# Patient Record
Sex: Female | Born: 1950 | Race: White | Hispanic: No | Marital: Single | Smoking: Former smoker
Health system: Southern US, Community
[De-identification: ages and names within clinical notes are randomized; demographics above are authoritative.]

## PROBLEM LIST (undated history)

## (undated) DIAGNOSIS — I495 Sick sinus syndrome: Secondary | ICD-10-CM

## (undated) DIAGNOSIS — Z950 Presence of cardiac pacemaker: Secondary | ICD-10-CM

## (undated) DIAGNOSIS — I10 Essential (primary) hypertension: Secondary | ICD-10-CM

## (undated) DIAGNOSIS — I2510 Atherosclerotic heart disease of native coronary artery without angina pectoris: Secondary | ICD-10-CM

## (undated) DIAGNOSIS — N183 Chronic kidney disease, stage 3 (moderate): Secondary | ICD-10-CM

## (undated) DIAGNOSIS — Z794 Long term (current) use of insulin: Secondary | ICD-10-CM

## (undated) DIAGNOSIS — E118 Type 2 diabetes mellitus with unspecified complications: Secondary | ICD-10-CM

## (undated) DIAGNOSIS — R296 Repeated falls: Secondary | ICD-10-CM

## (undated) HISTORY — DX: Sick sinus syndrome: I49.5

## (undated) HISTORY — PX: REPLACEMENT TOTAL KNEE BILATERAL: SUR1225

## (undated) HISTORY — DX: Repeated falls: R29.6

## (undated) HISTORY — PX: CHOLECYSTECTOMY: SHX55

---

## 2006-12-09 HISTORY — PX: CARDIAC PACEMAKER PLACEMENT: SHX583

## 2012-03-27 DIAGNOSIS — I213 ST elevation (STEMI) myocardial infarction of unspecified site: Secondary | ICD-10-CM | POA: Insufficient documentation

## 2016-11-08 DIAGNOSIS — I129 Hypertensive chronic kidney disease with stage 1 through stage 4 chronic kidney disease, or unspecified chronic kidney disease: Secondary | ICD-10-CM | POA: Diagnosis not present

## 2016-11-08 DIAGNOSIS — Z96653 Presence of artificial knee joint, bilateral: Secondary | ICD-10-CM | POA: Diagnosis not present

## 2016-11-08 DIAGNOSIS — N189 Chronic kidney disease, unspecified: Secondary | ICD-10-CM | POA: Diagnosis not present

## 2016-11-08 DIAGNOSIS — E1122 Type 2 diabetes mellitus with diabetic chronic kidney disease: Secondary | ICD-10-CM | POA: Diagnosis not present

## 2016-11-08 DIAGNOSIS — I252 Old myocardial infarction: Secondary | ICD-10-CM | POA: Diagnosis not present

## 2016-11-08 DIAGNOSIS — Z950 Presence of cardiac pacemaker: Secondary | ICD-10-CM | POA: Diagnosis not present

## 2016-11-08 DIAGNOSIS — Z794 Long term (current) use of insulin: Secondary | ICD-10-CM | POA: Diagnosis not present

## 2016-11-08 DIAGNOSIS — Z471 Aftercare following joint replacement surgery: Secondary | ICD-10-CM | POA: Diagnosis not present

## 2016-11-08 DIAGNOSIS — Z7901 Long term (current) use of anticoagulants: Secondary | ICD-10-CM | POA: Diagnosis not present

## 2016-11-08 DIAGNOSIS — M069 Rheumatoid arthritis, unspecified: Secondary | ICD-10-CM | POA: Diagnosis not present

## 2016-11-11 DIAGNOSIS — E1122 Type 2 diabetes mellitus with diabetic chronic kidney disease: Secondary | ICD-10-CM | POA: Diagnosis not present

## 2016-11-11 DIAGNOSIS — I129 Hypertensive chronic kidney disease with stage 1 through stage 4 chronic kidney disease, or unspecified chronic kidney disease: Secondary | ICD-10-CM | POA: Diagnosis not present

## 2016-11-11 DIAGNOSIS — Z471 Aftercare following joint replacement surgery: Secondary | ICD-10-CM | POA: Diagnosis not present

## 2016-11-11 DIAGNOSIS — M069 Rheumatoid arthritis, unspecified: Secondary | ICD-10-CM | POA: Diagnosis not present

## 2016-11-11 DIAGNOSIS — I252 Old myocardial infarction: Secondary | ICD-10-CM | POA: Diagnosis not present

## 2016-11-11 DIAGNOSIS — N189 Chronic kidney disease, unspecified: Secondary | ICD-10-CM | POA: Diagnosis not present

## 2016-11-12 DIAGNOSIS — I252 Old myocardial infarction: Secondary | ICD-10-CM | POA: Diagnosis not present

## 2016-11-12 DIAGNOSIS — I129 Hypertensive chronic kidney disease with stage 1 through stage 4 chronic kidney disease, or unspecified chronic kidney disease: Secondary | ICD-10-CM | POA: Diagnosis not present

## 2016-11-12 DIAGNOSIS — N189 Chronic kidney disease, unspecified: Secondary | ICD-10-CM | POA: Diagnosis not present

## 2016-11-12 DIAGNOSIS — M069 Rheumatoid arthritis, unspecified: Secondary | ICD-10-CM | POA: Diagnosis not present

## 2016-11-12 DIAGNOSIS — Z471 Aftercare following joint replacement surgery: Secondary | ICD-10-CM | POA: Diagnosis not present

## 2016-11-12 DIAGNOSIS — E1122 Type 2 diabetes mellitus with diabetic chronic kidney disease: Secondary | ICD-10-CM | POA: Diagnosis not present

## 2016-11-14 DIAGNOSIS — M069 Rheumatoid arthritis, unspecified: Secondary | ICD-10-CM | POA: Diagnosis not present

## 2016-11-14 DIAGNOSIS — I252 Old myocardial infarction: Secondary | ICD-10-CM | POA: Diagnosis not present

## 2016-11-14 DIAGNOSIS — E1122 Type 2 diabetes mellitus with diabetic chronic kidney disease: Secondary | ICD-10-CM | POA: Diagnosis not present

## 2016-11-14 DIAGNOSIS — I129 Hypertensive chronic kidney disease with stage 1 through stage 4 chronic kidney disease, or unspecified chronic kidney disease: Secondary | ICD-10-CM | POA: Diagnosis not present

## 2016-11-14 DIAGNOSIS — N189 Chronic kidney disease, unspecified: Secondary | ICD-10-CM | POA: Diagnosis not present

## 2016-11-14 DIAGNOSIS — Z471 Aftercare following joint replacement surgery: Secondary | ICD-10-CM | POA: Diagnosis not present

## 2016-11-19 DIAGNOSIS — M069 Rheumatoid arthritis, unspecified: Secondary | ICD-10-CM | POA: Diagnosis not present

## 2016-11-19 DIAGNOSIS — I252 Old myocardial infarction: Secondary | ICD-10-CM | POA: Diagnosis not present

## 2016-11-19 DIAGNOSIS — I129 Hypertensive chronic kidney disease with stage 1 through stage 4 chronic kidney disease, or unspecified chronic kidney disease: Secondary | ICD-10-CM | POA: Diagnosis not present

## 2016-11-19 DIAGNOSIS — E1122 Type 2 diabetes mellitus with diabetic chronic kidney disease: Secondary | ICD-10-CM | POA: Diagnosis not present

## 2016-11-19 DIAGNOSIS — Z471 Aftercare following joint replacement surgery: Secondary | ICD-10-CM | POA: Diagnosis not present

## 2016-11-19 DIAGNOSIS — N189 Chronic kidney disease, unspecified: Secondary | ICD-10-CM | POA: Diagnosis not present

## 2016-11-21 DIAGNOSIS — E1122 Type 2 diabetes mellitus with diabetic chronic kidney disease: Secondary | ICD-10-CM | POA: Diagnosis not present

## 2016-11-21 DIAGNOSIS — M069 Rheumatoid arthritis, unspecified: Secondary | ICD-10-CM | POA: Diagnosis not present

## 2016-11-21 DIAGNOSIS — I252 Old myocardial infarction: Secondary | ICD-10-CM | POA: Diagnosis not present

## 2016-11-21 DIAGNOSIS — N189 Chronic kidney disease, unspecified: Secondary | ICD-10-CM | POA: Diagnosis not present

## 2016-11-21 DIAGNOSIS — Z471 Aftercare following joint replacement surgery: Secondary | ICD-10-CM | POA: Diagnosis not present

## 2016-11-21 DIAGNOSIS — I129 Hypertensive chronic kidney disease with stage 1 through stage 4 chronic kidney disease, or unspecified chronic kidney disease: Secondary | ICD-10-CM | POA: Diagnosis not present

## 2016-11-26 DIAGNOSIS — M069 Rheumatoid arthritis, unspecified: Secondary | ICD-10-CM | POA: Diagnosis not present

## 2016-11-26 DIAGNOSIS — Z471 Aftercare following joint replacement surgery: Secondary | ICD-10-CM | POA: Diagnosis not present

## 2016-11-26 DIAGNOSIS — I129 Hypertensive chronic kidney disease with stage 1 through stage 4 chronic kidney disease, or unspecified chronic kidney disease: Secondary | ICD-10-CM | POA: Diagnosis not present

## 2016-11-26 DIAGNOSIS — I252 Old myocardial infarction: Secondary | ICD-10-CM | POA: Diagnosis not present

## 2016-11-26 DIAGNOSIS — E1122 Type 2 diabetes mellitus with diabetic chronic kidney disease: Secondary | ICD-10-CM | POA: Diagnosis not present

## 2016-11-26 DIAGNOSIS — N189 Chronic kidney disease, unspecified: Secondary | ICD-10-CM | POA: Diagnosis not present

## 2016-11-28 DIAGNOSIS — N189 Chronic kidney disease, unspecified: Secondary | ICD-10-CM | POA: Diagnosis not present

## 2016-11-28 DIAGNOSIS — I252 Old myocardial infarction: Secondary | ICD-10-CM | POA: Diagnosis not present

## 2016-11-28 DIAGNOSIS — M069 Rheumatoid arthritis, unspecified: Secondary | ICD-10-CM | POA: Diagnosis not present

## 2016-11-28 DIAGNOSIS — I129 Hypertensive chronic kidney disease with stage 1 through stage 4 chronic kidney disease, or unspecified chronic kidney disease: Secondary | ICD-10-CM | POA: Diagnosis not present

## 2016-11-28 DIAGNOSIS — E1122 Type 2 diabetes mellitus with diabetic chronic kidney disease: Secondary | ICD-10-CM | POA: Diagnosis not present

## 2016-11-28 DIAGNOSIS — Z471 Aftercare following joint replacement surgery: Secondary | ICD-10-CM | POA: Diagnosis not present

## 2016-12-05 DIAGNOSIS — I129 Hypertensive chronic kidney disease with stage 1 through stage 4 chronic kidney disease, or unspecified chronic kidney disease: Secondary | ICD-10-CM | POA: Diagnosis not present

## 2016-12-05 DIAGNOSIS — N189 Chronic kidney disease, unspecified: Secondary | ICD-10-CM | POA: Diagnosis not present

## 2016-12-05 DIAGNOSIS — M069 Rheumatoid arthritis, unspecified: Secondary | ICD-10-CM | POA: Diagnosis not present

## 2016-12-05 DIAGNOSIS — E1122 Type 2 diabetes mellitus with diabetic chronic kidney disease: Secondary | ICD-10-CM | POA: Diagnosis not present

## 2016-12-05 DIAGNOSIS — Z471 Aftercare following joint replacement surgery: Secondary | ICD-10-CM | POA: Diagnosis not present

## 2016-12-05 DIAGNOSIS — I252 Old myocardial infarction: Secondary | ICD-10-CM | POA: Diagnosis not present

## 2016-12-23 DIAGNOSIS — Z0001 Encounter for general adult medical examination with abnormal findings: Secondary | ICD-10-CM | POA: Diagnosis not present

## 2016-12-23 DIAGNOSIS — M545 Low back pain: Secondary | ICD-10-CM | POA: Diagnosis not present

## 2016-12-23 DIAGNOSIS — R7309 Other abnormal glucose: Secondary | ICD-10-CM | POA: Diagnosis not present

## 2016-12-23 DIAGNOSIS — E1122 Type 2 diabetes mellitus with diabetic chronic kidney disease: Secondary | ICD-10-CM | POA: Diagnosis not present

## 2016-12-23 DIAGNOSIS — N189 Chronic kidney disease, unspecified: Secondary | ICD-10-CM | POA: Diagnosis not present

## 2016-12-23 DIAGNOSIS — Z0000 Encounter for general adult medical examination without abnormal findings: Secondary | ICD-10-CM | POA: Diagnosis not present

## 2016-12-23 DIAGNOSIS — Z6835 Body mass index (BMI) 35.0-35.9, adult: Secondary | ICD-10-CM | POA: Diagnosis not present

## 2016-12-23 DIAGNOSIS — E668 Other obesity: Secondary | ICD-10-CM | POA: Diagnosis not present

## 2016-12-25 DIAGNOSIS — Z96651 Presence of right artificial knee joint: Secondary | ICD-10-CM | POA: Diagnosis not present

## 2016-12-25 DIAGNOSIS — M25561 Pain in right knee: Secondary | ICD-10-CM | POA: Diagnosis not present

## 2017-02-19 DIAGNOSIS — Z96651 Presence of right artificial knee joint: Secondary | ICD-10-CM | POA: Diagnosis not present

## 2017-02-19 DIAGNOSIS — M25561 Pain in right knee: Secondary | ICD-10-CM | POA: Diagnosis not present

## 2017-03-17 DIAGNOSIS — R001 Bradycardia, unspecified: Secondary | ICD-10-CM | POA: Diagnosis not present

## 2017-03-27 DIAGNOSIS — E1122 Type 2 diabetes mellitus with diabetic chronic kidney disease: Secondary | ICD-10-CM | POA: Diagnosis not present

## 2017-03-27 DIAGNOSIS — Z803 Family history of malignant neoplasm of breast: Secondary | ICD-10-CM | POA: Diagnosis not present

## 2017-03-27 DIAGNOSIS — Z1231 Encounter for screening mammogram for malignant neoplasm of breast: Secondary | ICD-10-CM | POA: Diagnosis not present

## 2017-05-02 DIAGNOSIS — E1122 Type 2 diabetes mellitus with diabetic chronic kidney disease: Secondary | ICD-10-CM | POA: Diagnosis not present

## 2017-05-22 DIAGNOSIS — E668 Other obesity: Secondary | ICD-10-CM | POA: Diagnosis not present

## 2017-05-22 DIAGNOSIS — E1122 Type 2 diabetes mellitus with diabetic chronic kidney disease: Secondary | ICD-10-CM | POA: Diagnosis not present

## 2017-05-22 DIAGNOSIS — I10 Essential (primary) hypertension: Secondary | ICD-10-CM | POA: Diagnosis not present

## 2017-05-22 DIAGNOSIS — Z0000 Encounter for general adult medical examination without abnormal findings: Secondary | ICD-10-CM | POA: Diagnosis not present

## 2017-08-13 DIAGNOSIS — E119 Type 2 diabetes mellitus without complications: Secondary | ICD-10-CM | POA: Diagnosis not present

## 2017-08-20 DIAGNOSIS — E668 Other obesity: Secondary | ICD-10-CM | POA: Diagnosis not present

## 2017-08-20 DIAGNOSIS — N189 Chronic kidney disease, unspecified: Secondary | ICD-10-CM | POA: Diagnosis not present

## 2017-08-20 DIAGNOSIS — M545 Low back pain: Secondary | ICD-10-CM | POA: Diagnosis not present

## 2017-08-20 DIAGNOSIS — I10 Essential (primary) hypertension: Secondary | ICD-10-CM | POA: Diagnosis not present

## 2017-09-19 DIAGNOSIS — D131 Benign neoplasm of stomach: Secondary | ICD-10-CM | POA: Diagnosis not present

## 2017-09-19 DIAGNOSIS — K3184 Gastroparesis: Secondary | ICD-10-CM | POA: Diagnosis present

## 2017-09-19 DIAGNOSIS — K2950 Unspecified chronic gastritis without bleeding: Secondary | ICD-10-CM | POA: Diagnosis not present

## 2017-09-19 DIAGNOSIS — I82612 Acute embolism and thrombosis of superficial veins of left upper extremity: Secondary | ICD-10-CM | POA: Diagnosis not present

## 2017-09-19 DIAGNOSIS — N390 Urinary tract infection, site not specified: Secondary | ICD-10-CM | POA: Diagnosis not present

## 2017-09-19 DIAGNOSIS — R198 Other specified symptoms and signs involving the digestive system and abdomen: Secondary | ICD-10-CM | POA: Diagnosis not present

## 2017-09-19 DIAGNOSIS — R1084 Generalized abdominal pain: Secondary | ICD-10-CM | POA: Diagnosis not present

## 2017-09-19 DIAGNOSIS — I10 Essential (primary) hypertension: Secondary | ICD-10-CM | POA: Diagnosis not present

## 2017-09-19 DIAGNOSIS — B9620 Unspecified Escherichia coli [E. coli] as the cause of diseases classified elsewhere: Secondary | ICD-10-CM | POA: Diagnosis not present

## 2017-09-19 DIAGNOSIS — Z8719 Personal history of other diseases of the digestive system: Secondary | ICD-10-CM | POA: Diagnosis not present

## 2017-09-19 DIAGNOSIS — Z794 Long term (current) use of insulin: Secondary | ICD-10-CM | POA: Diagnosis not present

## 2017-09-19 DIAGNOSIS — N182 Chronic kidney disease, stage 2 (mild): Secondary | ICD-10-CM | POA: Diagnosis not present

## 2017-09-19 DIAGNOSIS — Z6836 Body mass index (BMI) 36.0-36.9, adult: Secondary | ICD-10-CM | POA: Diagnosis not present

## 2017-09-19 DIAGNOSIS — Z66 Do not resuscitate: Secondary | ICD-10-CM | POA: Diagnosis present

## 2017-09-19 DIAGNOSIS — R195 Other fecal abnormalities: Secondary | ICD-10-CM | POA: Diagnosis not present

## 2017-09-19 DIAGNOSIS — F329 Major depressive disorder, single episode, unspecified: Secondary | ICD-10-CM | POA: Diagnosis present

## 2017-09-19 DIAGNOSIS — R109 Unspecified abdominal pain: Secondary | ICD-10-CM | POA: Diagnosis not present

## 2017-09-19 DIAGNOSIS — T82534A Leakage of infusion catheter, initial encounter: Secondary | ICD-10-CM | POA: Diagnosis not present

## 2017-09-19 DIAGNOSIS — K582 Mixed irritable bowel syndrome: Secondary | ICD-10-CM | POA: Diagnosis not present

## 2017-09-19 DIAGNOSIS — K3189 Other diseases of stomach and duodenum: Secondary | ICD-10-CM | POA: Diagnosis not present

## 2017-09-19 DIAGNOSIS — Z23 Encounter for immunization: Secondary | ICD-10-CM | POA: Diagnosis not present

## 2017-09-19 DIAGNOSIS — F419 Anxiety disorder, unspecified: Secondary | ICD-10-CM | POA: Diagnosis present

## 2017-09-19 DIAGNOSIS — E119 Type 2 diabetes mellitus without complications: Secondary | ICD-10-CM | POA: Diagnosis not present

## 2017-09-19 DIAGNOSIS — K5732 Diverticulitis of large intestine without perforation or abscess without bleeding: Secondary | ICD-10-CM | POA: Diagnosis not present

## 2017-09-19 DIAGNOSIS — E1122 Type 2 diabetes mellitus with diabetic chronic kidney disease: Secondary | ICD-10-CM | POA: Diagnosis present

## 2017-09-19 DIAGNOSIS — R1013 Epigastric pain: Secondary | ICD-10-CM | POA: Diagnosis not present

## 2017-09-19 DIAGNOSIS — E876 Hypokalemia: Secondary | ICD-10-CM | POA: Diagnosis not present

## 2017-09-19 DIAGNOSIS — E669 Obesity, unspecified: Secondary | ICD-10-CM | POA: Diagnosis present

## 2017-09-19 DIAGNOSIS — E213 Hyperparathyroidism, unspecified: Secondary | ICD-10-CM | POA: Diagnosis present

## 2017-09-19 DIAGNOSIS — G8929 Other chronic pain: Secondary | ICD-10-CM | POA: Diagnosis present

## 2017-09-19 DIAGNOSIS — I129 Hypertensive chronic kidney disease with stage 1 through stage 4 chronic kidney disease, or unspecified chronic kidney disease: Secondary | ICD-10-CM | POA: Diagnosis present

## 2017-09-19 DIAGNOSIS — R197 Diarrhea, unspecified: Secondary | ICD-10-CM | POA: Diagnosis not present

## 2017-09-19 DIAGNOSIS — E1143 Type 2 diabetes mellitus with diabetic autonomic (poly)neuropathy: Secondary | ICD-10-CM | POA: Diagnosis present

## 2017-09-19 DIAGNOSIS — K529 Noninfective gastroenteritis and colitis, unspecified: Secondary | ICD-10-CM | POA: Diagnosis not present

## 2017-09-29 DIAGNOSIS — I129 Hypertensive chronic kidney disease with stage 1 through stage 4 chronic kidney disease, or unspecified chronic kidney disease: Secondary | ICD-10-CM | POA: Diagnosis not present

## 2017-09-29 DIAGNOSIS — I2510 Atherosclerotic heart disease of native coronary artery without angina pectoris: Secondary | ICD-10-CM | POA: Diagnosis not present

## 2017-09-29 DIAGNOSIS — M6281 Muscle weakness (generalized): Secondary | ICD-10-CM | POA: Diagnosis not present

## 2017-09-29 DIAGNOSIS — E1122 Type 2 diabetes mellitus with diabetic chronic kidney disease: Secondary | ICD-10-CM | POA: Diagnosis not present

## 2017-09-29 DIAGNOSIS — N182 Chronic kidney disease, stage 2 (mild): Secondary | ICD-10-CM | POA: Diagnosis not present

## 2017-09-29 DIAGNOSIS — M1990 Unspecified osteoarthritis, unspecified site: Secondary | ICD-10-CM | POA: Diagnosis not present

## 2017-09-29 DIAGNOSIS — F419 Anxiety disorder, unspecified: Secondary | ICD-10-CM | POA: Diagnosis not present

## 2017-09-29 DIAGNOSIS — F329 Major depressive disorder, single episode, unspecified: Secondary | ICD-10-CM | POA: Diagnosis not present

## 2017-09-29 DIAGNOSIS — Z794 Long term (current) use of insulin: Secondary | ICD-10-CM | POA: Diagnosis not present

## 2017-09-29 DIAGNOSIS — E039 Hypothyroidism, unspecified: Secondary | ICD-10-CM | POA: Diagnosis not present

## 2017-09-30 DIAGNOSIS — I129 Hypertensive chronic kidney disease with stage 1 through stage 4 chronic kidney disease, or unspecified chronic kidney disease: Secondary | ICD-10-CM | POA: Diagnosis not present

## 2017-09-30 DIAGNOSIS — N182 Chronic kidney disease, stage 2 (mild): Secondary | ICD-10-CM | POA: Diagnosis not present

## 2017-09-30 DIAGNOSIS — I2510 Atherosclerotic heart disease of native coronary artery without angina pectoris: Secondary | ICD-10-CM | POA: Diagnosis not present

## 2017-09-30 DIAGNOSIS — M1990 Unspecified osteoarthritis, unspecified site: Secondary | ICD-10-CM | POA: Diagnosis not present

## 2017-09-30 DIAGNOSIS — F329 Major depressive disorder, single episode, unspecified: Secondary | ICD-10-CM | POA: Diagnosis not present

## 2017-09-30 DIAGNOSIS — E1122 Type 2 diabetes mellitus with diabetic chronic kidney disease: Secondary | ICD-10-CM | POA: Diagnosis not present

## 2017-10-01 DIAGNOSIS — E1122 Type 2 diabetes mellitus with diabetic chronic kidney disease: Secondary | ICD-10-CM | POA: Diagnosis not present

## 2017-10-01 DIAGNOSIS — I129 Hypertensive chronic kidney disease with stage 1 through stage 4 chronic kidney disease, or unspecified chronic kidney disease: Secondary | ICD-10-CM | POA: Diagnosis not present

## 2017-10-01 DIAGNOSIS — F329 Major depressive disorder, single episode, unspecified: Secondary | ICD-10-CM | POA: Diagnosis not present

## 2017-10-01 DIAGNOSIS — M1990 Unspecified osteoarthritis, unspecified site: Secondary | ICD-10-CM | POA: Diagnosis not present

## 2017-10-01 DIAGNOSIS — N182 Chronic kidney disease, stage 2 (mild): Secondary | ICD-10-CM | POA: Diagnosis not present

## 2017-10-01 DIAGNOSIS — I2510 Atherosclerotic heart disease of native coronary artery without angina pectoris: Secondary | ICD-10-CM | POA: Diagnosis not present

## 2017-10-02 DIAGNOSIS — I2510 Atherosclerotic heart disease of native coronary artery without angina pectoris: Secondary | ICD-10-CM | POA: Diagnosis not present

## 2017-10-02 DIAGNOSIS — M1990 Unspecified osteoarthritis, unspecified site: Secondary | ICD-10-CM | POA: Diagnosis not present

## 2017-10-02 DIAGNOSIS — N182 Chronic kidney disease, stage 2 (mild): Secondary | ICD-10-CM | POA: Diagnosis not present

## 2017-10-02 DIAGNOSIS — E1122 Type 2 diabetes mellitus with diabetic chronic kidney disease: Secondary | ICD-10-CM | POA: Diagnosis not present

## 2017-10-02 DIAGNOSIS — F329 Major depressive disorder, single episode, unspecified: Secondary | ICD-10-CM | POA: Diagnosis not present

## 2017-10-02 DIAGNOSIS — I129 Hypertensive chronic kidney disease with stage 1 through stage 4 chronic kidney disease, or unspecified chronic kidney disease: Secondary | ICD-10-CM | POA: Diagnosis not present

## 2017-10-06 DIAGNOSIS — M1990 Unspecified osteoarthritis, unspecified site: Secondary | ICD-10-CM | POA: Diagnosis not present

## 2017-10-06 DIAGNOSIS — N182 Chronic kidney disease, stage 2 (mild): Secondary | ICD-10-CM | POA: Diagnosis not present

## 2017-10-06 DIAGNOSIS — E1122 Type 2 diabetes mellitus with diabetic chronic kidney disease: Secondary | ICD-10-CM | POA: Diagnosis not present

## 2017-10-06 DIAGNOSIS — F329 Major depressive disorder, single episode, unspecified: Secondary | ICD-10-CM | POA: Diagnosis not present

## 2017-10-06 DIAGNOSIS — I2510 Atherosclerotic heart disease of native coronary artery without angina pectoris: Secondary | ICD-10-CM | POA: Diagnosis not present

## 2017-10-06 DIAGNOSIS — I129 Hypertensive chronic kidney disease with stage 1 through stage 4 chronic kidney disease, or unspecified chronic kidney disease: Secondary | ICD-10-CM | POA: Diagnosis not present

## 2017-10-10 DIAGNOSIS — N182 Chronic kidney disease, stage 2 (mild): Secondary | ICD-10-CM | POA: Diagnosis not present

## 2017-10-10 DIAGNOSIS — I129 Hypertensive chronic kidney disease with stage 1 through stage 4 chronic kidney disease, or unspecified chronic kidney disease: Secondary | ICD-10-CM | POA: Diagnosis not present

## 2017-10-10 DIAGNOSIS — F329 Major depressive disorder, single episode, unspecified: Secondary | ICD-10-CM | POA: Diagnosis not present

## 2017-10-10 DIAGNOSIS — I2510 Atherosclerotic heart disease of native coronary artery without angina pectoris: Secondary | ICD-10-CM | POA: Diagnosis not present

## 2017-10-10 DIAGNOSIS — E1122 Type 2 diabetes mellitus with diabetic chronic kidney disease: Secondary | ICD-10-CM | POA: Diagnosis not present

## 2017-10-10 DIAGNOSIS — M1990 Unspecified osteoarthritis, unspecified site: Secondary | ICD-10-CM | POA: Diagnosis not present

## 2017-10-14 DIAGNOSIS — I129 Hypertensive chronic kidney disease with stage 1 through stage 4 chronic kidney disease, or unspecified chronic kidney disease: Secondary | ICD-10-CM | POA: Diagnosis not present

## 2017-10-14 DIAGNOSIS — F329 Major depressive disorder, single episode, unspecified: Secondary | ICD-10-CM | POA: Diagnosis not present

## 2017-10-14 DIAGNOSIS — M1990 Unspecified osteoarthritis, unspecified site: Secondary | ICD-10-CM | POA: Diagnosis not present

## 2017-10-14 DIAGNOSIS — N182 Chronic kidney disease, stage 2 (mild): Secondary | ICD-10-CM | POA: Diagnosis not present

## 2017-10-14 DIAGNOSIS — E1122 Type 2 diabetes mellitus with diabetic chronic kidney disease: Secondary | ICD-10-CM | POA: Diagnosis not present

## 2017-10-14 DIAGNOSIS — I2510 Atherosclerotic heart disease of native coronary artery without angina pectoris: Secondary | ICD-10-CM | POA: Diagnosis not present

## 2017-10-15 DIAGNOSIS — E1122 Type 2 diabetes mellitus with diabetic chronic kidney disease: Secondary | ICD-10-CM | POA: Diagnosis not present

## 2017-10-15 DIAGNOSIS — N182 Chronic kidney disease, stage 2 (mild): Secondary | ICD-10-CM | POA: Diagnosis not present

## 2017-10-15 DIAGNOSIS — F329 Major depressive disorder, single episode, unspecified: Secondary | ICD-10-CM | POA: Diagnosis not present

## 2017-10-15 DIAGNOSIS — M1990 Unspecified osteoarthritis, unspecified site: Secondary | ICD-10-CM | POA: Diagnosis not present

## 2017-10-15 DIAGNOSIS — I129 Hypertensive chronic kidney disease with stage 1 through stage 4 chronic kidney disease, or unspecified chronic kidney disease: Secondary | ICD-10-CM | POA: Diagnosis not present

## 2017-10-15 DIAGNOSIS — I2510 Atherosclerotic heart disease of native coronary artery without angina pectoris: Secondary | ICD-10-CM | POA: Diagnosis not present

## 2017-10-21 DIAGNOSIS — Z950 Presence of cardiac pacemaker: Secondary | ICD-10-CM | POA: Diagnosis not present

## 2017-10-21 DIAGNOSIS — N179 Acute kidney failure, unspecified: Secondary | ICD-10-CM | POA: Diagnosis not present

## 2017-10-21 DIAGNOSIS — I21A1 Myocardial infarction type 2: Secondary | ICD-10-CM | POA: Diagnosis not present

## 2017-10-21 DIAGNOSIS — E1122 Type 2 diabetes mellitus with diabetic chronic kidney disease: Secondary | ICD-10-CM | POA: Diagnosis present

## 2017-10-21 DIAGNOSIS — I129 Hypertensive chronic kidney disease with stage 1 through stage 4 chronic kidney disease, or unspecified chronic kidney disease: Secondary | ICD-10-CM | POA: Diagnosis present

## 2017-10-21 DIAGNOSIS — R7989 Other specified abnormal findings of blood chemistry: Secondary | ICD-10-CM | POA: Diagnosis not present

## 2017-10-21 DIAGNOSIS — R079 Chest pain, unspecified: Secondary | ICD-10-CM | POA: Diagnosis not present

## 2017-10-21 DIAGNOSIS — R109 Unspecified abdominal pain: Secondary | ICD-10-CM | POA: Diagnosis not present

## 2017-10-21 DIAGNOSIS — I495 Sick sinus syndrome: Secondary | ICD-10-CM | POA: Diagnosis present

## 2017-10-21 DIAGNOSIS — I119 Hypertensive heart disease without heart failure: Secondary | ICD-10-CM | POA: Diagnosis not present

## 2017-10-21 DIAGNOSIS — E860 Dehydration: Secondary | ICD-10-CM | POA: Diagnosis not present

## 2017-10-21 DIAGNOSIS — R42 Dizziness and giddiness: Secondary | ICD-10-CM | POA: Diagnosis not present

## 2017-10-21 DIAGNOSIS — E871 Hypo-osmolality and hyponatremia: Secondary | ICD-10-CM | POA: Diagnosis not present

## 2017-10-21 DIAGNOSIS — K3184 Gastroparesis: Secondary | ICD-10-CM | POA: Diagnosis present

## 2017-10-21 DIAGNOSIS — E1143 Type 2 diabetes mellitus with diabetic autonomic (poly)neuropathy: Secondary | ICD-10-CM | POA: Diagnosis not present

## 2017-10-21 DIAGNOSIS — N183 Chronic kidney disease, stage 3 (moderate): Secondary | ICD-10-CM | POA: Diagnosis not present

## 2017-10-21 DIAGNOSIS — K828 Other specified diseases of gallbladder: Secondary | ICD-10-CM | POA: Diagnosis present

## 2017-10-21 DIAGNOSIS — N390 Urinary tract infection, site not specified: Secondary | ICD-10-CM | POA: Diagnosis not present

## 2017-10-21 DIAGNOSIS — R112 Nausea with vomiting, unspecified: Secondary | ICD-10-CM | POA: Diagnosis not present

## 2017-10-21 DIAGNOSIS — Z9181 History of falling: Secondary | ICD-10-CM | POA: Diagnosis not present

## 2017-10-21 DIAGNOSIS — E1165 Type 2 diabetes mellitus with hyperglycemia: Secondary | ICD-10-CM | POA: Diagnosis not present

## 2017-10-26 DIAGNOSIS — I129 Hypertensive chronic kidney disease with stage 1 through stage 4 chronic kidney disease, or unspecified chronic kidney disease: Secondary | ICD-10-CM | POA: Diagnosis not present

## 2017-10-26 DIAGNOSIS — E1122 Type 2 diabetes mellitus with diabetic chronic kidney disease: Secondary | ICD-10-CM | POA: Diagnosis not present

## 2017-10-26 DIAGNOSIS — I2510 Atherosclerotic heart disease of native coronary artery without angina pectoris: Secondary | ICD-10-CM | POA: Diagnosis not present

## 2017-10-26 DIAGNOSIS — F329 Major depressive disorder, single episode, unspecified: Secondary | ICD-10-CM | POA: Diagnosis not present

## 2017-10-26 DIAGNOSIS — M1990 Unspecified osteoarthritis, unspecified site: Secondary | ICD-10-CM | POA: Diagnosis not present

## 2017-10-26 DIAGNOSIS — N182 Chronic kidney disease, stage 2 (mild): Secondary | ICD-10-CM | POA: Diagnosis not present

## 2017-10-28 DIAGNOSIS — F329 Major depressive disorder, single episode, unspecified: Secondary | ICD-10-CM | POA: Diagnosis not present

## 2017-10-28 DIAGNOSIS — I2510 Atherosclerotic heart disease of native coronary artery without angina pectoris: Secondary | ICD-10-CM | POA: Diagnosis not present

## 2017-10-28 DIAGNOSIS — M1990 Unspecified osteoarthritis, unspecified site: Secondary | ICD-10-CM | POA: Diagnosis not present

## 2017-10-28 DIAGNOSIS — E1122 Type 2 diabetes mellitus with diabetic chronic kidney disease: Secondary | ICD-10-CM | POA: Diagnosis not present

## 2017-10-28 DIAGNOSIS — N182 Chronic kidney disease, stage 2 (mild): Secondary | ICD-10-CM | POA: Diagnosis not present

## 2017-10-28 DIAGNOSIS — I129 Hypertensive chronic kidney disease with stage 1 through stage 4 chronic kidney disease, or unspecified chronic kidney disease: Secondary | ICD-10-CM | POA: Diagnosis not present

## 2017-10-29 DIAGNOSIS — I129 Hypertensive chronic kidney disease with stage 1 through stage 4 chronic kidney disease, or unspecified chronic kidney disease: Secondary | ICD-10-CM | POA: Diagnosis not present

## 2017-10-29 DIAGNOSIS — M1990 Unspecified osteoarthritis, unspecified site: Secondary | ICD-10-CM | POA: Diagnosis not present

## 2017-10-29 DIAGNOSIS — I2510 Atherosclerotic heart disease of native coronary artery without angina pectoris: Secondary | ICD-10-CM | POA: Diagnosis not present

## 2017-10-29 DIAGNOSIS — E1122 Type 2 diabetes mellitus with diabetic chronic kidney disease: Secondary | ICD-10-CM | POA: Diagnosis not present

## 2017-10-29 DIAGNOSIS — F329 Major depressive disorder, single episode, unspecified: Secondary | ICD-10-CM | POA: Diagnosis not present

## 2017-10-29 DIAGNOSIS — N182 Chronic kidney disease, stage 2 (mild): Secondary | ICD-10-CM | POA: Diagnosis not present

## 2017-11-03 DIAGNOSIS — E1122 Type 2 diabetes mellitus with diabetic chronic kidney disease: Secondary | ICD-10-CM | POA: Diagnosis not present

## 2017-11-03 DIAGNOSIS — M1990 Unspecified osteoarthritis, unspecified site: Secondary | ICD-10-CM | POA: Diagnosis not present

## 2017-11-03 DIAGNOSIS — I2510 Atherosclerotic heart disease of native coronary artery without angina pectoris: Secondary | ICD-10-CM | POA: Diagnosis not present

## 2017-11-03 DIAGNOSIS — I129 Hypertensive chronic kidney disease with stage 1 through stage 4 chronic kidney disease, or unspecified chronic kidney disease: Secondary | ICD-10-CM | POA: Diagnosis not present

## 2017-11-03 DIAGNOSIS — F329 Major depressive disorder, single episode, unspecified: Secondary | ICD-10-CM | POA: Diagnosis not present

## 2017-11-03 DIAGNOSIS — N182 Chronic kidney disease, stage 2 (mild): Secondary | ICD-10-CM | POA: Diagnosis not present

## 2017-11-05 DIAGNOSIS — N182 Chronic kidney disease, stage 2 (mild): Secondary | ICD-10-CM | POA: Diagnosis not present

## 2017-11-05 DIAGNOSIS — I129 Hypertensive chronic kidney disease with stage 1 through stage 4 chronic kidney disease, or unspecified chronic kidney disease: Secondary | ICD-10-CM | POA: Diagnosis not present

## 2017-11-05 DIAGNOSIS — E1122 Type 2 diabetes mellitus with diabetic chronic kidney disease: Secondary | ICD-10-CM | POA: Diagnosis not present

## 2017-11-05 DIAGNOSIS — F329 Major depressive disorder, single episode, unspecified: Secondary | ICD-10-CM | POA: Diagnosis not present

## 2017-11-05 DIAGNOSIS — I2510 Atherosclerotic heart disease of native coronary artery without angina pectoris: Secondary | ICD-10-CM | POA: Diagnosis not present

## 2017-11-05 DIAGNOSIS — M1990 Unspecified osteoarthritis, unspecified site: Secondary | ICD-10-CM | POA: Diagnosis not present

## 2017-11-06 DIAGNOSIS — N182 Chronic kidney disease, stage 2 (mild): Secondary | ICD-10-CM | POA: Diagnosis not present

## 2017-11-06 DIAGNOSIS — I2510 Atherosclerotic heart disease of native coronary artery without angina pectoris: Secondary | ICD-10-CM | POA: Diagnosis not present

## 2017-11-06 DIAGNOSIS — F329 Major depressive disorder, single episode, unspecified: Secondary | ICD-10-CM | POA: Diagnosis not present

## 2017-11-06 DIAGNOSIS — I129 Hypertensive chronic kidney disease with stage 1 through stage 4 chronic kidney disease, or unspecified chronic kidney disease: Secondary | ICD-10-CM | POA: Diagnosis not present

## 2017-11-06 DIAGNOSIS — M1990 Unspecified osteoarthritis, unspecified site: Secondary | ICD-10-CM | POA: Diagnosis not present

## 2017-11-06 DIAGNOSIS — E1122 Type 2 diabetes mellitus with diabetic chronic kidney disease: Secondary | ICD-10-CM | POA: Diagnosis not present

## 2017-11-11 DIAGNOSIS — F329 Major depressive disorder, single episode, unspecified: Secondary | ICD-10-CM | POA: Diagnosis not present

## 2017-11-11 DIAGNOSIS — I2510 Atherosclerotic heart disease of native coronary artery without angina pectoris: Secondary | ICD-10-CM | POA: Diagnosis not present

## 2017-11-11 DIAGNOSIS — I129 Hypertensive chronic kidney disease with stage 1 through stage 4 chronic kidney disease, or unspecified chronic kidney disease: Secondary | ICD-10-CM | POA: Diagnosis not present

## 2017-11-11 DIAGNOSIS — N182 Chronic kidney disease, stage 2 (mild): Secondary | ICD-10-CM | POA: Diagnosis not present

## 2017-11-11 DIAGNOSIS — M1990 Unspecified osteoarthritis, unspecified site: Secondary | ICD-10-CM | POA: Diagnosis not present

## 2017-11-11 DIAGNOSIS — E1122 Type 2 diabetes mellitus with diabetic chronic kidney disease: Secondary | ICD-10-CM | POA: Diagnosis not present

## 2017-11-13 DIAGNOSIS — I2510 Atherosclerotic heart disease of native coronary artery without angina pectoris: Secondary | ICD-10-CM | POA: Diagnosis not present

## 2017-11-13 DIAGNOSIS — E1122 Type 2 diabetes mellitus with diabetic chronic kidney disease: Secondary | ICD-10-CM | POA: Diagnosis not present

## 2017-11-13 DIAGNOSIS — M1990 Unspecified osteoarthritis, unspecified site: Secondary | ICD-10-CM | POA: Diagnosis not present

## 2017-11-13 DIAGNOSIS — N182 Chronic kidney disease, stage 2 (mild): Secondary | ICD-10-CM | POA: Diagnosis not present

## 2017-11-13 DIAGNOSIS — I129 Hypertensive chronic kidney disease with stage 1 through stage 4 chronic kidney disease, or unspecified chronic kidney disease: Secondary | ICD-10-CM | POA: Diagnosis not present

## 2017-11-13 DIAGNOSIS — F329 Major depressive disorder, single episode, unspecified: Secondary | ICD-10-CM | POA: Diagnosis not present

## 2017-11-14 DIAGNOSIS — I10 Essential (primary) hypertension: Secondary | ICD-10-CM | POA: Diagnosis not present

## 2017-11-14 DIAGNOSIS — E1122 Type 2 diabetes mellitus with diabetic chronic kidney disease: Secondary | ICD-10-CM | POA: Diagnosis not present

## 2017-11-14 DIAGNOSIS — E119 Type 2 diabetes mellitus without complications: Secondary | ICD-10-CM | POA: Diagnosis not present

## 2017-11-14 DIAGNOSIS — E669 Obesity, unspecified: Secondary | ICD-10-CM | POA: Diagnosis not present

## 2017-11-19 DIAGNOSIS — F329 Major depressive disorder, single episode, unspecified: Secondary | ICD-10-CM | POA: Diagnosis not present

## 2017-11-19 DIAGNOSIS — I129 Hypertensive chronic kidney disease with stage 1 through stage 4 chronic kidney disease, or unspecified chronic kidney disease: Secondary | ICD-10-CM | POA: Diagnosis not present

## 2017-11-19 DIAGNOSIS — E1122 Type 2 diabetes mellitus with diabetic chronic kidney disease: Secondary | ICD-10-CM | POA: Diagnosis not present

## 2017-11-19 DIAGNOSIS — I2510 Atherosclerotic heart disease of native coronary artery without angina pectoris: Secondary | ICD-10-CM | POA: Diagnosis not present

## 2017-11-19 DIAGNOSIS — M1990 Unspecified osteoarthritis, unspecified site: Secondary | ICD-10-CM | POA: Diagnosis not present

## 2017-11-19 DIAGNOSIS — N182 Chronic kidney disease, stage 2 (mild): Secondary | ICD-10-CM | POA: Diagnosis not present

## 2018-01-07 DIAGNOSIS — R5383 Other fatigue: Secondary | ICD-10-CM | POA: Diagnosis not present

## 2018-01-07 DIAGNOSIS — E119 Type 2 diabetes mellitus without complications: Secondary | ICD-10-CM | POA: Diagnosis not present

## 2018-01-16 DIAGNOSIS — Z9114 Patient's other noncompliance with medication regimen: Secondary | ICD-10-CM | POA: Diagnosis not present

## 2018-01-16 DIAGNOSIS — E1122 Type 2 diabetes mellitus with diabetic chronic kidney disease: Secondary | ICD-10-CM | POA: Diagnosis not present

## 2018-01-16 DIAGNOSIS — I10 Essential (primary) hypertension: Secondary | ICD-10-CM | POA: Diagnosis not present

## 2018-01-16 DIAGNOSIS — E213 Hyperparathyroidism, unspecified: Secondary | ICD-10-CM | POA: Diagnosis not present

## 2018-01-16 DIAGNOSIS — Z01818 Encounter for other preprocedural examination: Secondary | ICD-10-CM | POA: Diagnosis not present

## 2018-02-11 DIAGNOSIS — N3281 Overactive bladder: Secondary | ICD-10-CM | POA: Diagnosis not present

## 2018-02-11 DIAGNOSIS — N3941 Urge incontinence: Secondary | ICD-10-CM | POA: Diagnosis not present

## 2018-02-11 DIAGNOSIS — N2889 Other specified disorders of kidney and ureter: Secondary | ICD-10-CM | POA: Diagnosis not present

## 2018-03-06 DIAGNOSIS — N2889 Other specified disorders of kidney and ureter: Secondary | ICD-10-CM | POA: Diagnosis not present

## 2018-03-06 DIAGNOSIS — N281 Cyst of kidney, acquired: Secondary | ICD-10-CM | POA: Diagnosis not present

## 2018-03-11 DIAGNOSIS — N281 Cyst of kidney, acquired: Secondary | ICD-10-CM | POA: Diagnosis not present

## 2018-03-11 DIAGNOSIS — N3281 Overactive bladder: Secondary | ICD-10-CM | POA: Diagnosis not present

## 2018-03-11 DIAGNOSIS — N3941 Urge incontinence: Secondary | ICD-10-CM | POA: Diagnosis not present

## 2018-04-06 DIAGNOSIS — I422 Other hypertrophic cardiomyopathy: Secondary | ICD-10-CM | POA: Diagnosis not present

## 2018-04-06 DIAGNOSIS — R001 Bradycardia, unspecified: Secondary | ICD-10-CM | POA: Diagnosis not present

## 2018-04-06 DIAGNOSIS — I119 Hypertensive heart disease without heart failure: Secondary | ICD-10-CM | POA: Diagnosis not present

## 2018-04-06 DIAGNOSIS — I25118 Atherosclerotic heart disease of native coronary artery with other forms of angina pectoris: Secondary | ICD-10-CM | POA: Diagnosis not present

## 2018-04-06 DIAGNOSIS — R55 Syncope and collapse: Secondary | ICD-10-CM | POA: Diagnosis not present

## 2018-04-24 DIAGNOSIS — E119 Type 2 diabetes mellitus without complications: Secondary | ICD-10-CM | POA: Diagnosis not present

## 2018-04-24 DIAGNOSIS — Z043 Encounter for examination and observation following other accident: Secondary | ICD-10-CM | POA: Diagnosis not present

## 2018-04-24 DIAGNOSIS — M79672 Pain in left foot: Secondary | ICD-10-CM | POA: Diagnosis not present

## 2018-04-24 DIAGNOSIS — R1110 Vomiting, unspecified: Secondary | ICD-10-CM | POA: Diagnosis not present

## 2018-04-24 DIAGNOSIS — R109 Unspecified abdominal pain: Secondary | ICD-10-CM | POA: Diagnosis not present

## 2018-04-24 DIAGNOSIS — W19XXXA Unspecified fall, initial encounter: Secondary | ICD-10-CM | POA: Diagnosis not present

## 2018-04-24 DIAGNOSIS — M25511 Pain in right shoulder: Secondary | ICD-10-CM | POA: Diagnosis not present

## 2018-04-24 DIAGNOSIS — R531 Weakness: Secondary | ICD-10-CM | POA: Diagnosis not present

## 2018-04-30 DIAGNOSIS — E668 Other obesity: Secondary | ICD-10-CM | POA: Diagnosis not present

## 2018-04-30 DIAGNOSIS — K219 Gastro-esophageal reflux disease without esophagitis: Secondary | ICD-10-CM | POA: Diagnosis not present

## 2018-04-30 DIAGNOSIS — I10 Essential (primary) hypertension: Secondary | ICD-10-CM | POA: Diagnosis not present

## 2018-04-30 DIAGNOSIS — E1122 Type 2 diabetes mellitus with diabetic chronic kidney disease: Secondary | ICD-10-CM | POA: Diagnosis not present

## 2018-07-13 DIAGNOSIS — R001 Bradycardia, unspecified: Secondary | ICD-10-CM | POA: Diagnosis not present

## 2018-07-24 DIAGNOSIS — E1122 Type 2 diabetes mellitus with diabetic chronic kidney disease: Secondary | ICD-10-CM | POA: Diagnosis not present

## 2018-07-29 DIAGNOSIS — L608 Other nail disorders: Secondary | ICD-10-CM | POA: Diagnosis not present

## 2018-08-05 DIAGNOSIS — Z6834 Body mass index (BMI) 34.0-34.9, adult: Secondary | ICD-10-CM | POA: Diagnosis not present

## 2018-08-05 DIAGNOSIS — E1122 Type 2 diabetes mellitus with diabetic chronic kidney disease: Secondary | ICD-10-CM | POA: Diagnosis not present

## 2018-08-05 DIAGNOSIS — I10 Essential (primary) hypertension: Secondary | ICD-10-CM | POA: Diagnosis not present

## 2018-08-05 DIAGNOSIS — K219 Gastro-esophageal reflux disease without esophagitis: Secondary | ICD-10-CM | POA: Diagnosis not present

## 2018-09-03 DIAGNOSIS — R001 Bradycardia, unspecified: Secondary | ICD-10-CM | POA: Diagnosis not present

## 2018-09-08 DIAGNOSIS — I422 Other hypertrophic cardiomyopathy: Secondary | ICD-10-CM | POA: Diagnosis not present

## 2018-09-08 HISTORY — PX: PACEMAKER GENERATOR CHANGE: SHX5998

## 2018-09-15 DIAGNOSIS — I10 Essential (primary) hypertension: Secondary | ICD-10-CM | POA: Diagnosis not present

## 2018-09-15 DIAGNOSIS — N189 Chronic kidney disease, unspecified: Secondary | ICD-10-CM | POA: Diagnosis not present

## 2018-09-15 DIAGNOSIS — E669 Obesity, unspecified: Secondary | ICD-10-CM | POA: Diagnosis not present

## 2018-09-15 DIAGNOSIS — R42 Dizziness and giddiness: Secondary | ICD-10-CM | POA: Diagnosis not present

## 2018-09-15 DIAGNOSIS — Z794 Long term (current) use of insulin: Secondary | ICD-10-CM | POA: Diagnosis not present

## 2018-09-15 DIAGNOSIS — I2510 Atherosclerotic heart disease of native coronary artery without angina pectoris: Secondary | ICD-10-CM | POA: Diagnosis not present

## 2018-09-15 DIAGNOSIS — T82191A Other mechanical complication of cardiac pulse generator (battery), initial encounter: Secondary | ICD-10-CM | POA: Diagnosis not present

## 2018-09-15 DIAGNOSIS — Z9119 Patient's noncompliance with other medical treatment and regimen: Secondary | ICD-10-CM | POA: Diagnosis not present

## 2018-09-15 DIAGNOSIS — I129 Hypertensive chronic kidney disease with stage 1 through stage 4 chronic kidney disease, or unspecified chronic kidney disease: Secondary | ICD-10-CM | POA: Diagnosis not present

## 2018-09-15 DIAGNOSIS — I495 Sick sinus syndrome: Secondary | ICD-10-CM | POA: Diagnosis not present

## 2018-09-15 DIAGNOSIS — Z7984 Long term (current) use of oral hypoglycemic drugs: Secondary | ICD-10-CM | POA: Diagnosis not present

## 2018-09-15 DIAGNOSIS — R0600 Dyspnea, unspecified: Secondary | ICD-10-CM | POA: Diagnosis not present

## 2018-09-15 DIAGNOSIS — Z6834 Body mass index (BMI) 34.0-34.9, adult: Secondary | ICD-10-CM | POA: Diagnosis not present

## 2018-09-15 DIAGNOSIS — Z45010 Encounter for checking and testing of cardiac pacemaker pulse generator [battery]: Secondary | ICD-10-CM | POA: Diagnosis not present

## 2018-09-15 DIAGNOSIS — E1122 Type 2 diabetes mellitus with diabetic chronic kidney disease: Secondary | ICD-10-CM | POA: Diagnosis not present

## 2018-09-15 DIAGNOSIS — I422 Other hypertrophic cardiomyopathy: Secondary | ICD-10-CM | POA: Diagnosis not present

## 2018-09-15 DIAGNOSIS — N184 Chronic kidney disease, stage 4 (severe): Secondary | ICD-10-CM | POA: Diagnosis not present

## 2018-09-15 DIAGNOSIS — R001 Bradycardia, unspecified: Secondary | ICD-10-CM | POA: Diagnosis not present

## 2018-09-15 DIAGNOSIS — Z7901 Long term (current) use of anticoagulants: Secondary | ICD-10-CM | POA: Diagnosis not present

## 2018-09-28 DIAGNOSIS — E1022 Type 1 diabetes mellitus with diabetic chronic kidney disease: Secondary | ICD-10-CM | POA: Diagnosis not present

## 2018-09-28 DIAGNOSIS — I422 Other hypertrophic cardiomyopathy: Secondary | ICD-10-CM | POA: Diagnosis not present

## 2018-09-28 DIAGNOSIS — R001 Bradycardia, unspecified: Secondary | ICD-10-CM | POA: Diagnosis not present

## 2018-09-28 DIAGNOSIS — Z950 Presence of cardiac pacemaker: Secondary | ICD-10-CM | POA: Diagnosis not present

## 2018-09-28 DIAGNOSIS — I10 Essential (primary) hypertension: Secondary | ICD-10-CM | POA: Diagnosis not present

## 2018-09-28 DIAGNOSIS — R55 Syncope and collapse: Secondary | ICD-10-CM | POA: Diagnosis not present

## 2018-09-28 DIAGNOSIS — I25118 Atherosclerotic heart disease of native coronary artery with other forms of angina pectoris: Secondary | ICD-10-CM | POA: Diagnosis not present

## 2018-09-28 DIAGNOSIS — I119 Hypertensive heart disease without heart failure: Secondary | ICD-10-CM | POA: Diagnosis not present

## 2018-09-28 DIAGNOSIS — R079 Chest pain, unspecified: Secondary | ICD-10-CM | POA: Diagnosis not present

## 2018-10-31 ENCOUNTER — Inpatient Hospital Stay (HOSPITAL_COMMUNITY): Payer: Medicare Other

## 2018-10-31 ENCOUNTER — Inpatient Hospital Stay (HOSPITAL_COMMUNITY)
Admission: EM | Admit: 2018-10-31 | Discharge: 2018-11-12 | DRG: 074 | Disposition: A | Discharge status: Home Health | Payer: Medicare Other | Attending: Internal Medicine | Admitting: Internal Medicine

## 2018-10-31 ENCOUNTER — Other Ambulatory Visit: Payer: Self-pay

## 2018-10-31 ENCOUNTER — Inpatient Hospital Stay (HOSPITAL_COMMUNITY)
Admission: EM | Disposition: A | Discharge status: Home Health | Payer: Self-pay | Source: Home / Self Care | Attending: Internal Medicine

## 2018-10-31 ENCOUNTER — Encounter (HOSPITAL_COMMUNITY): Payer: Self-pay

## 2018-10-31 DIAGNOSIS — Z79899 Other long term (current) drug therapy: Secondary | ICD-10-CM

## 2018-10-31 DIAGNOSIS — R079 Chest pain, unspecified: Secondary | ICD-10-CM

## 2018-10-31 DIAGNOSIS — F1290 Cannabis use, unspecified, uncomplicated: Secondary | ICD-10-CM | POA: Diagnosis present

## 2018-10-31 DIAGNOSIS — K2900 Acute gastritis without bleeding: Secondary | ICD-10-CM | POA: Diagnosis not present

## 2018-10-31 DIAGNOSIS — Z87442 Personal history of urinary calculi: Secondary | ICD-10-CM

## 2018-10-31 DIAGNOSIS — E1142 Type 2 diabetes mellitus with diabetic polyneuropathy: Secondary | ICD-10-CM | POA: Diagnosis present

## 2018-10-31 DIAGNOSIS — Z23 Encounter for immunization: Secondary | ICD-10-CM | POA: Diagnosis not present

## 2018-10-31 DIAGNOSIS — R9431 Abnormal electrocardiogram [ECG] [EKG]: Secondary | ICD-10-CM | POA: Diagnosis not present

## 2018-10-31 DIAGNOSIS — E860 Dehydration: Secondary | ICD-10-CM | POA: Diagnosis present

## 2018-10-31 DIAGNOSIS — E669 Obesity, unspecified: Secondary | ICD-10-CM | POA: Diagnosis present

## 2018-10-31 DIAGNOSIS — R112 Nausea with vomiting, unspecified: Secondary | ICD-10-CM | POA: Diagnosis present

## 2018-10-31 DIAGNOSIS — E876 Hypokalemia: Secondary | ICD-10-CM | POA: Diagnosis present

## 2018-10-31 DIAGNOSIS — Z950 Presence of cardiac pacemaker: Secondary | ICD-10-CM

## 2018-10-31 DIAGNOSIS — E1122 Type 2 diabetes mellitus with diabetic chronic kidney disease: Secondary | ICD-10-CM | POA: Diagnosis present

## 2018-10-31 DIAGNOSIS — Z79891 Long term (current) use of opiate analgesic: Secondary | ICD-10-CM

## 2018-10-31 DIAGNOSIS — K5900 Constipation, unspecified: Secondary | ICD-10-CM | POA: Diagnosis present

## 2018-10-31 DIAGNOSIS — N19 Unspecified kidney failure: Secondary | ICD-10-CM | POA: Diagnosis not present

## 2018-10-31 DIAGNOSIS — N281 Cyst of kidney, acquired: Secondary | ICD-10-CM | POA: Diagnosis not present

## 2018-10-31 DIAGNOSIS — I130 Hypertensive heart and chronic kidney disease with heart failure and stage 1 through stage 4 chronic kidney disease, or unspecified chronic kidney disease: Secondary | ICD-10-CM | POA: Diagnosis present

## 2018-10-31 DIAGNOSIS — I5032 Chronic diastolic (congestive) heart failure: Secondary | ICD-10-CM | POA: Diagnosis not present

## 2018-10-31 DIAGNOSIS — I25119 Atherosclerotic heart disease of native coronary artery with unspecified angina pectoris: Secondary | ICD-10-CM | POA: Diagnosis present

## 2018-10-31 DIAGNOSIS — I213 ST elevation (STEMI) myocardial infarction of unspecified site: Secondary | ICD-10-CM | POA: Diagnosis present

## 2018-10-31 DIAGNOSIS — R7989 Other specified abnormal findings of blood chemistry: Secondary | ICD-10-CM | POA: Diagnosis present

## 2018-10-31 DIAGNOSIS — I10 Essential (primary) hypertension: Secondary | ICD-10-CM | POA: Diagnosis present

## 2018-10-31 DIAGNOSIS — R296 Repeated falls: Secondary | ICD-10-CM | POA: Diagnosis present

## 2018-10-31 DIAGNOSIS — H8111 Benign paroxysmal vertigo, right ear: Secondary | ICD-10-CM | POA: Diagnosis not present

## 2018-10-31 DIAGNOSIS — K3184 Gastroparesis: Secondary | ICD-10-CM

## 2018-10-31 DIAGNOSIS — I2699 Other pulmonary embolism without acute cor pulmonale: Secondary | ICD-10-CM

## 2018-10-31 DIAGNOSIS — N183 Chronic kidney disease, stage 3 unspecified: Secondary | ICD-10-CM | POA: Diagnosis present

## 2018-10-31 DIAGNOSIS — E1121 Type 2 diabetes mellitus with diabetic nephropathy: Secondary | ICD-10-CM | POA: Diagnosis not present

## 2018-10-31 DIAGNOSIS — E1129 Type 2 diabetes mellitus with other diabetic kidney complication: Secondary | ICD-10-CM | POA: Diagnosis not present

## 2018-10-31 DIAGNOSIS — Z885 Allergy status to narcotic agent status: Secondary | ICD-10-CM

## 2018-10-31 DIAGNOSIS — E440 Moderate protein-calorie malnutrition: Secondary | ICD-10-CM | POA: Diagnosis not present

## 2018-10-31 DIAGNOSIS — N179 Acute kidney failure, unspecified: Secondary | ICD-10-CM

## 2018-10-31 DIAGNOSIS — I249 Acute ischemic heart disease, unspecified: Secondary | ICD-10-CM | POA: Diagnosis not present

## 2018-10-31 DIAGNOSIS — K449 Diaphragmatic hernia without obstruction or gangrene: Secondary | ICD-10-CM | POA: Diagnosis present

## 2018-10-31 DIAGNOSIS — Z7151 Drug abuse counseling and surveillance of drug abuser: Secondary | ICD-10-CM

## 2018-10-31 DIAGNOSIS — N184 Chronic kidney disease, stage 4 (severe): Secondary | ICD-10-CM | POA: Diagnosis not present

## 2018-10-31 DIAGNOSIS — Z6832 Body mass index (BMI) 32.0-32.9, adult: Secondary | ICD-10-CM

## 2018-10-31 DIAGNOSIS — R0902 Hypoxemia: Secondary | ICD-10-CM | POA: Diagnosis not present

## 2018-10-31 DIAGNOSIS — E1143 Type 2 diabetes mellitus with diabetic autonomic (poly)neuropathy: Principal | ICD-10-CM

## 2018-10-31 DIAGNOSIS — K3189 Other diseases of stomach and duodenum: Secondary | ICD-10-CM | POA: Diagnosis not present

## 2018-10-31 DIAGNOSIS — I959 Hypotension, unspecified: Secondary | ICD-10-CM | POA: Diagnosis not present

## 2018-10-31 DIAGNOSIS — R0789 Other chest pain: Secondary | ICD-10-CM | POA: Diagnosis not present

## 2018-10-31 DIAGNOSIS — Z833 Family history of diabetes mellitus: Secondary | ICD-10-CM

## 2018-10-31 DIAGNOSIS — E0821 Diabetes mellitus due to underlying condition with diabetic nephropathy: Secondary | ICD-10-CM | POA: Diagnosis not present

## 2018-10-31 DIAGNOSIS — Z794 Long term (current) use of insulin: Secondary | ICD-10-CM | POA: Diagnosis not present

## 2018-10-31 DIAGNOSIS — E118 Type 2 diabetes mellitus with unspecified complications: Secondary | ICD-10-CM

## 2018-10-31 DIAGNOSIS — R55 Syncope and collapse: Secondary | ICD-10-CM | POA: Diagnosis present

## 2018-10-31 DIAGNOSIS — N390 Urinary tract infection, site not specified: Secondary | ICD-10-CM | POA: Diagnosis present

## 2018-10-31 HISTORY — DX: Long term (current) use of insulin: Z79.4

## 2018-10-31 HISTORY — DX: Presence of cardiac pacemaker: Z95.0

## 2018-10-31 HISTORY — PX: LEFT HEART CATH AND CORONARY ANGIOGRAPHY: CATH118249

## 2018-10-31 HISTORY — DX: Type 2 diabetes mellitus with unspecified complications: Z79.4

## 2018-10-31 HISTORY — DX: Essential (primary) hypertension: I10

## 2018-10-31 HISTORY — DX: Chronic kidney disease, stage 3 unspecified: N18.30

## 2018-10-31 HISTORY — DX: Chronic kidney disease, stage 3 (moderate): N18.3

## 2018-10-31 HISTORY — DX: Type 2 diabetes mellitus with unspecified complications: E11.8

## 2018-10-31 HISTORY — DX: Atherosclerotic heart disease of native coronary artery without angina pectoris: I25.10

## 2018-10-31 LAB — COMPREHENSIVE METABOLIC PANEL
ALT: 20 U/L (ref 0–44)
ALT: 92 U/L — AB (ref 0–44)
ANION GAP: 12 (ref 5–15)
AST: 27 U/L (ref 15–41)
AST: 163 U/L — ABNORMAL HIGH (ref 15–41)
Albumin: 2.3 g/dL — ABNORMAL LOW (ref 3.5–5.0)
Albumin: 3.7 g/dL (ref 3.5–5.0)
Alkaline Phosphatase: 85 U/L (ref 38–126)
Alkaline Phosphatase: 137 U/L — ABNORMAL HIGH (ref 38–126)
Anion gap: 7 (ref 5–15)
BUN: 38 mg/dL — ABNORMAL HIGH (ref 8–23)
BUN: 53 mg/dL — ABNORMAL HIGH (ref 8–23)
CHLORIDE: 98 mmol/L (ref 98–111)
CO2: 18 mmol/L — ABNORMAL LOW (ref 22–32)
CO2: 22 mmol/L (ref 22–32)
Calcium: 7.3 mg/dL — ABNORMAL LOW (ref 8.9–10.3)
Calcium: 11.1 mg/dL — ABNORMAL HIGH (ref 8.9–10.3)
Chloride: 114 mmol/L — ABNORMAL HIGH (ref 98–111)
Creatinine, Ser: 2.71 mg/dL — ABNORMAL HIGH (ref 0.44–1.00)
Creatinine, Ser: 3.64 mg/dL — ABNORMAL HIGH (ref 0.44–1.00)
GFR calc Af Amer: 20 mL/min — ABNORMAL LOW (ref >60)
GFR calc non Af Amer: 17 mL/min — ABNORMAL LOW (ref >60)
GFR calc non Af Amer: 12 mL/min — ABNORMAL LOW (ref >60)
GFR, EST AFRICAN AMERICAN: 14 mL/min — AB (ref >60)
Glucose, Bld: 132 mg/dL — ABNORMAL HIGH (ref 70–99)
Glucose, Bld: 207 mg/dL — ABNORMAL HIGH (ref 70–99)
Potassium: 2.2 mmol/L — CL (ref 3.5–5.1)
Potassium: 3.4 mmol/L — ABNORMAL LOW (ref 3.5–5.1)
SODIUM: 132 mmol/L — AB (ref 135–145)
Sodium: 139 mmol/L (ref 135–145)
TOTAL PROTEIN: 6.6 g/dL (ref 6.5–8.1)
Total Bilirubin: 0.8 mg/dL (ref 0.3–1.2)
Total Bilirubin: 1.5 mg/dL — ABNORMAL HIGH (ref 0.3–1.2)
Total Protein: 4.1 g/dL — ABNORMAL LOW (ref 6.5–8.1)

## 2018-10-31 LAB — CBC WITH DIFFERENTIAL/PLATELET
Abs Immature Granulocytes: 0.06 10*3/uL (ref 0.00–0.07)
Basophils Absolute: 0.1 10*3/uL (ref 0.0–0.1)
Basophils Relative: 1 %
Eosinophils Absolute: 0.1 10*3/uL (ref 0.0–0.5)
Eosinophils Relative: 1 %
HCT: 35.2 % — ABNORMAL LOW (ref 36.0–46.0)
Hemoglobin: 11.5 g/dL — ABNORMAL LOW (ref 12.0–15.0)
Immature Granulocytes: 1 %
Lymphocytes Relative: 21 %
Lymphs Abs: 2.4 10*3/uL (ref 0.7–4.0)
MCH: 29.6 pg (ref 26.0–34.0)
MCHC: 32.7 g/dL (ref 30.0–36.0)
MCV: 90.7 fL (ref 80.0–100.0)
Monocytes Absolute: 1 10*3/uL (ref 0.1–1.0)
Monocytes Relative: 9 %
Neutro Abs: 7.9 10*3/uL — ABNORMAL HIGH (ref 1.7–7.7)
Neutrophils Relative %: 67 %
Platelets: 181 10*3/uL (ref 150–400)
RBC: 3.88 MIL/uL (ref 3.87–5.11)
RDW: 12.1 % (ref 11.5–15.5)
WBC: 11.5 10*3/uL — ABNORMAL HIGH (ref 4.0–10.5)
nRBC: 0 % (ref 0.0–0.2)

## 2018-10-31 LAB — TROPONIN I
TROPONIN I: 0.45 ng/mL — AB (ref <0.03)
Troponin I: 0.31 ng/mL (ref <0.03)
Troponin I: 0.46 ng/mL (ref <0.03)

## 2018-10-31 LAB — POCT I-STAT, CHEM 8
BUN: 32 mg/dL — ABNORMAL HIGH (ref 8–23)
BUN: 48 mg/dL — AB (ref 8–23)
CALCIUM ION: 1.09 mmol/L — AB (ref 1.15–1.40)
CHLORIDE: 111 mmol/L (ref 98–111)
CREATININE: 3.8 mg/dL — AB (ref 0.44–1.00)
Calcium, Ion: 1.5 mmol/L — ABNORMAL HIGH (ref 1.15–1.40)
Chloride: 98 mmol/L (ref 98–111)
Creatinine, Ser: 2.5 mg/dL — ABNORMAL HIGH (ref 0.44–1.00)
GLUCOSE: 128 mg/dL — AB (ref 70–99)
GLUCOSE: 193 mg/dL — AB (ref 70–99)
HCT: 42 % (ref 36.0–46.0)
HEMATOCRIT: 31 % — AB (ref 36.0–46.0)
HEMOGLOBIN: 14.3 g/dL (ref 12.0–15.0)
Hemoglobin: 10.5 g/dL — ABNORMAL LOW (ref 12.0–15.0)
POTASSIUM: 3.3 mmol/L — AB (ref 3.5–5.1)
Potassium: 2.2 mmol/L — CL (ref 3.5–5.1)
SODIUM: 141 mmol/L (ref 135–145)
Sodium: 135 mmol/L (ref 135–145)
TCO2: 18 mmol/L — AB (ref 22–32)
TCO2: 25 mmol/L (ref 22–32)

## 2018-10-31 LAB — HEPARIN LEVEL (UNFRACTIONATED): Heparin Unfractionated: <0.1 IU/mL — ABNORMAL LOW (ref 0.30–0.70)

## 2018-10-31 LAB — URINALYSIS, ROUTINE W REFLEX MICROSCOPIC
Bilirubin Urine: NEGATIVE
Glucose, UA: NEGATIVE mg/dL
Ketones, ur: NEGATIVE mg/dL
NITRITE: NEGATIVE
PH: 5 (ref 5.0–8.0)
PROTEIN: 30 mg/dL — AB
SPECIFIC GRAVITY, URINE: 1.026 (ref 1.005–1.030)
WBC, UA: >50 WBC/hpf — ABNORMAL HIGH (ref 0–5)

## 2018-10-31 LAB — HEMOGLOBIN A1C
Hgb A1c MFr Bld: 7 % — ABNORMAL HIGH (ref 4.8–5.6)
Mean Plasma Glucose: 154.2 mg/dL

## 2018-10-31 LAB — POCT I-STAT TROPONIN I: Troponin i, poc: 0.33 ng/mL (ref 0.00–0.08)

## 2018-10-31 LAB — APTT: aPTT: 29 seconds (ref 24–36)

## 2018-10-31 LAB — LIPID PANEL
Cholesterol: 114 mg/dL (ref 0–200)
HDL: 16 mg/dL — ABNORMAL LOW (ref >40)
LDL Cholesterol: 64 mg/dL (ref 0–99)
Total CHOL/HDL Ratio: 7.1 RATIO
Triglycerides: 170 mg/dL — ABNORMAL HIGH (ref <150)
VLDL: 34 mg/dL (ref 0–40)

## 2018-10-31 LAB — D-DIMER, QUANTITATIVE (NOT AT ARMC): D DIMER QUANT: 0.82 ug{FEU}/mL — AB (ref 0.00–0.50)

## 2018-10-31 LAB — CREATININE, URINE, RANDOM: Creatinine, Urine: 176.68 mg/dL

## 2018-10-31 LAB — GLUCOSE, CAPILLARY
GLUCOSE-CAPILLARY: 192 mg/dL — AB (ref 70–99)
GLUCOSE-CAPILLARY: 196 mg/dL — AB (ref 70–99)

## 2018-10-31 LAB — SODIUM, URINE, RANDOM: Sodium, Ur: 29 mmol/L

## 2018-10-31 LAB — PROTIME-INR
INR: 1.31
Prothrombin Time: 16.1 seconds — ABNORMAL HIGH (ref 11.4–15.2)

## 2018-10-31 LAB — MRSA PCR SCREENING: MRSA by PCR: NEGATIVE

## 2018-10-31 SURGERY — LEFT HEART CATH AND CORONARY ANGIOGRAPHY
Anesthesia: LOCAL

## 2018-10-31 MED ORDER — INSULIN ASPART PROT & ASPART (70-30 MIX) 100 UNIT/ML ~~LOC~~ SUSP
12.0000 [IU] | Freq: Two times a day (BID) | SUBCUTANEOUS | Status: DC
  Administered 2018-11-01 – 2018-11-12 (×15): 12 [IU] via SUBCUTANEOUS
  Filled 2018-10-31: qty 10

## 2018-10-31 MED ORDER — SODIUM CHLORIDE 0.9% FLUSH: 3.0000 mL | INTRAVENOUS | Status: DC | PRN

## 2018-10-31 MED ORDER — SODIUM CHLORIDE 0.9 % IV SOLN: INTRAVENOUS | Status: AC

## 2018-10-31 MED ORDER — INSULIN ASPART 100 UNIT/ML ~~LOC~~ SOLN
0.0000 [IU] | Freq: Three times a day (TID) | SUBCUTANEOUS | Status: DC
  Administered 2018-11-01 (×2): 4 [IU] via SUBCUTANEOUS
  Administered 2018-11-02 (×2): 3 [IU] via SUBCUTANEOUS
  Administered 2018-11-03 – 2018-11-04 (×2): 4 [IU] via SUBCUTANEOUS
  Administered 2018-11-06: 3 [IU] via SUBCUTANEOUS
  Administered 2018-11-06: 4 [IU] via SUBCUTANEOUS
  Administered 2018-11-07: 3 [IU] via SUBCUTANEOUS
  Administered 2018-11-09 (×2): 4 [IU] via SUBCUTANEOUS
  Administered 2018-11-10 – 2018-11-11 (×3): 3 [IU] via SUBCUTANEOUS
  Administered 2018-11-11: 4 [IU] via SUBCUTANEOUS
  Administered 2018-11-12: 3 [IU] via SUBCUTANEOUS

## 2018-10-31 MED ORDER — POTASSIUM CHLORIDE 20 MEQ PO PACK: 40.0000 meq | PACK | Freq: Once | ORAL | Status: DC

## 2018-10-31 MED ORDER — HEPARIN (PORCINE) IN NACL 1000-0.9 UT/500ML-% IV SOLN
INTRAVENOUS | Status: DC | PRN
  Administered 2018-10-31: 500 mL

## 2018-10-31 MED ORDER — MORPHINE SULFATE (PF) 2 MG/ML IV SOLN
2.0000 mg | INTRAVENOUS | Status: DC | PRN
  Administered 2018-11-05 – 2018-11-10 (×4): 2 mg via INTRAVENOUS
  Filled 2018-10-31 (×6): qty 1

## 2018-10-31 MED ORDER — FENTANYL CITRATE (PF) 100 MCG/2ML IJ SOLN
INTRAMUSCULAR | Status: DC | PRN
  Administered 2018-10-31 (×2): 50 ug via INTRAVENOUS

## 2018-10-31 MED ORDER — HEPARIN SODIUM (PORCINE) 1000 UNIT/ML IJ SOLN
INTRAMUSCULAR | Status: DC | PRN
  Administered 2018-10-31: 5000 [IU] via INTRAVENOUS

## 2018-10-31 MED ORDER — SODIUM CHLORIDE 0.9 % IV SOLN: 250.0000 mL | INTRAVENOUS | Status: DC | PRN

## 2018-10-31 MED ORDER — INSULIN ASPART 100 UNIT/ML ~~LOC~~ SOLN: 0.0000 [IU] | Freq: Every day | SUBCUTANEOUS | Status: DC

## 2018-10-31 MED ORDER — HEPARIN (PORCINE) IN NACL 1000-0.9 UT/500ML-% IV SOLN
INTRAVENOUS | Status: AC
  Filled 2018-10-31: qty 1500

## 2018-10-31 MED ORDER — HEPARIN (PORCINE) 25000 UT/250ML-% IV SOLN
1150.0000 [IU]/h | INTRAVENOUS | Status: DC
  Administered 2018-10-31: 1000 [IU]/h via INTRAVENOUS
  Filled 2018-10-31: qty 250

## 2018-10-31 MED ORDER — SODIUM CHLORIDE 0.9% FLUSH
3.0000 mL | Freq: Two times a day (BID) | INTRAVENOUS | Status: DC
  Administered 2018-11-01 – 2018-11-12 (×18): 3 mL via INTRAVENOUS

## 2018-10-31 MED ORDER — ALBUTEROL SULFATE (2.5 MG/3ML) 0.083% IN NEBU: 2.5000 mg | INHALATION_SOLUTION | RESPIRATORY_TRACT | Status: DC | PRN

## 2018-10-31 MED ORDER — PERFLUTREN LIPID MICROSPHERE
INTRAVENOUS | Status: AC
  Filled 2018-10-31: qty 10

## 2018-10-31 MED ORDER — ONDANSETRON HCL 4 MG/2ML IJ SOLN
INTRAMUSCULAR | Status: DC | PRN
  Administered 2018-10-31: 4 mg

## 2018-10-31 MED ORDER — HEPARIN (PORCINE) IN NACL 1000-0.9 UT/500ML-% IV SOLN
INTRAVENOUS | Status: DC | PRN
  Administered 2018-10-31 (×2): 500 mL

## 2018-10-31 MED ORDER — INFLUENZA VAC SPLIT HIGH-DOSE 0.5 ML IM SUSY
0.5000 mL | PREFILLED_SYRINGE | INTRAMUSCULAR | Status: DC
  Filled 2018-10-31 (×2): qty 0.5

## 2018-10-31 MED ORDER — LIDOCAINE HCL (PF) 1 % IJ SOLN
INTRAMUSCULAR | Status: AC
  Filled 2018-10-31: qty 30

## 2018-10-31 MED ORDER — IOHEXOL 350 MG/ML SOLN
INTRAVENOUS | Status: DC | PRN
  Administered 2018-10-31: 55 mL via INTRA_ARTERIAL

## 2018-10-31 MED ORDER — LIDOCAINE HCL (PF) 1 % IJ SOLN
INTRAMUSCULAR | Status: DC | PRN
  Administered 2018-10-31: 3 mL
  Administered 2018-10-31: 18 mL

## 2018-10-31 MED ORDER — HEPARIN SODIUM (PORCINE) 1000 UNIT/ML IJ SOLN
4000.0000 [IU] | Freq: Once | INTRAMUSCULAR | Status: DC
  Filled 2018-10-31: qty 4

## 2018-10-31 MED ORDER — ONDANSETRON HCL 4 MG/2ML IJ SOLN
4.0000 mg | Freq: Four times a day (QID) | INTRAMUSCULAR | Status: DC | PRN
  Administered 2018-11-01 – 2018-11-10 (×19): 4 mg via INTRAVENOUS
  Filled 2018-10-31 (×21): qty 2

## 2018-10-31 MED ORDER — MIDAZOLAM HCL 2 MG/2ML IJ SOLN
INTRAMUSCULAR | Status: AC
  Filled 2018-10-31: qty 2

## 2018-10-31 MED ORDER — ASPIRIN 81 MG PO CHEW
81.0000 mg | CHEWABLE_TABLET | Freq: Every day | ORAL | Status: DC
  Administered 2018-11-01 – 2018-11-12 (×12): 81 mg via ORAL
  Filled 2018-10-31 (×13): qty 1

## 2018-10-31 MED ORDER — PNEUMOCOCCAL VAC POLYVALENT 25 MCG/0.5ML IJ INJ
0.5000 mL | INJECTION | INTRAMUSCULAR | Status: DC
  Filled 2018-10-31: qty 0.5

## 2018-10-31 MED ORDER — NITROGLYCERIN 1 MG/10 ML FOR IR/CATH LAB
INTRA_ARTERIAL | Status: AC
  Filled 2018-10-31: qty 10

## 2018-10-31 MED ORDER — PERFLUTREN LIPID MICROSPHERE
1.0000 mL | INTRAVENOUS | Status: AC | PRN
  Filled 2018-10-31: qty 10

## 2018-10-31 MED ORDER — HEPARIN (PORCINE) 25000 UT/250ML-% IV SOLN
850.0000 [IU]/h | INTRAVENOUS | Status: DC
  Filled 2018-10-31: qty 250

## 2018-10-31 MED ORDER — FENTANYL CITRATE (PF) 100 MCG/2ML IJ SOLN
INTRAMUSCULAR | Status: AC
  Filled 2018-10-31: qty 2

## 2018-10-31 MED ORDER — HEPARIN BOLUS VIA INFUSION
4000.0000 [IU] | Freq: Once | INTRAVENOUS | Status: DC
  Filled 2018-10-31: qty 4000

## 2018-10-31 MED ORDER — VERAPAMIL HCL 2.5 MG/ML IV SOLN
INTRAVENOUS | Status: AC
  Filled 2018-10-31: qty 2

## 2018-10-31 MED ORDER — SODIUM CHLORIDE 0.9 % IV SOLN: INTRAVENOUS | Status: DC

## 2018-10-31 MED ORDER — MIDAZOLAM HCL 2 MG/2ML IJ SOLN
INTRAMUSCULAR | Status: DC | PRN
  Administered 2018-10-31: 1 mg via INTRAVENOUS

## 2018-10-31 SURGICAL SUPPLY — 15 items
CATH INFINITI 5FR MULTPACK ANG (CATHETERS) ×2 IMPLANT
CATH OPTITORQUE TIG 4.0 5F (CATHETERS) ×2 IMPLANT
CLOSURE MYNX CONTROL 6F/7F (Vascular Products) ×2 IMPLANT
GLIDESHEATH SLEND A-KIT 6F 22G (SHEATH) ×2 IMPLANT
GLIDESHEATH SLEND SS 6F .021 (SHEATH) ×2 IMPLANT
GUIDEWIRE INQWIRE 1.5J.035X260 (WIRE) IMPLANT
INQWIRE 1.5J .035X260CM (WIRE)
KIT ENCORE 26 ADVANTAGE (KITS) ×2 IMPLANT
KIT HEART LEFT (KITS) ×2 IMPLANT
PACK CARDIAC CATHETERIZATION (CUSTOM PROCEDURE TRAY) ×2 IMPLANT
SHEATH PINNACLE 6F 10CM (SHEATH) ×2 IMPLANT
SHEATH PROBE COVER 6X72 (BAG) ×2 IMPLANT
TRANSDUCER W/STOPCOCK (MISCELLANEOUS) ×4 IMPLANT
TUBING CIL FLEX 10 FLL-RA (TUBING) ×2 IMPLANT
WIRE EMERALD 3MM-J .035X150CM (WIRE) ×2 IMPLANT

## 2018-10-31 NOTE — Progress Notes (Signed)
ANTICOAGULATION CONSULT NOTE - Initial Consult  Pharmacy Consult for Heparin Indication: chest pain/ACS  No Known Allergies  Patient Measurements: Height: 5\' 3"  (160 cm) Weight: 185 lb (83.9 kg) IBW/kg (Calculated) : 52.4 Heparin Dosing Weight: 71 kg  Vital Signs: Temp: 97.3 F (36.3 C) (11/23 1235) Temp Source: Axillary (11/23 1235) BP: 91/60 (11/23 1235) Pulse Rate: 66 (11/23 1235)  Labs: No results for input(s): HGB, HCT, PLT, APTT, LABPROT, INR, HEPARINUNFRC, HEPRLOWMOCWT, CREATININE, CKTOTAL, CKMB, TROPONINI in the last 72 hours.  CrCl cannot be calculated (No successful lab value found.).   Medical History: Past Medical History:  Diagnosis Date  . Coronary artery disease   . Diabetes mellitus without complication ([place])   . Hypertension   . Pacemaker     Medications:  Scheduled:  . heparin  4,000 Units Intravenous Once    Assessment: 36 YOF who presents with syncope x2 yesterday and CP today in addition to N/V - being worked up for STEMI. Patient received 324 mg of ASA prior to arrival. Pharmacy has been consulted for heparin dosing.  Goal of Therapy:  Heparin level 0.3-0.7 units/ml Monitor platelets by anticoagulation protocol: Yes   Plan:  Give 4000 units bolus x 1 Start heparin infusion at 850 units/hr Check anti-Xa level in 6 hours and daily while on heparin Continue to monitor H&H and platelets  Jackson Latino, PharmD PGY1 Pharmacy Resident Phone [phone] 10/31/2018     12:43 PM

## 2018-10-31 NOTE — ED Provider Notes (Signed)
[facility] EMERGENCY DEPARTMENT Provider Note   CSN: [id number] Arrival date & time: 10/31/18  1226     History   Chief Complaint No chief complaint on file.   HPI Melissa Lopez is a 67 y.o. female.  HPI   67 year old female brought in by EMS as a code STEMI.  Patient is not a good historian.  She is actually not from this area.  She is currently visiting from [place].  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  She states that her legs just give out and she falls.  She thinks she may have brief loss of consciousness.  Past Medical History:  Diagnosis Date  . Coronary artery disease   . Diabetes mellitus without complication ([place])   . Hypertension   . Pacemaker     There are no active problems to display for this patient.   History reviewed. No pertinent surgical history.   OB History   None      Home Medications    Prior to Admission medications   Not on File    Family History No family history on file.  Social History Social History   Tobacco Use  . Smoking status: Never Smoker  . Smokeless tobacco: Never Used  Substance Use Topics  . Alcohol use: Not on file  . Drug use: Not on file     Allergies   Patient has no known allergies.   Review of Systems Review of Systems  All systems reviewed and negative, other than as noted in HPI.  Physical Exam Updated Vital Signs BP 91/60 (BP Location: Right Arm)   Pulse 66   Temp (!) 97.3 F (36.3 C) (Axillary)   Resp 16   Ht 5\' 3"  (1.6 m)   Wt 83.9 kg   SpO2 100%   BMI 32.77 kg/m   Physical Exam  Constitutional: She appears well-developed and well-nourished. No distress.  HENT:  Head: Normocephalic and atraumatic.  Eyes: Conjunctivae are normal. Right eye exhibits no discharge. Left eye exhibits no discharge.  Neck: Neck supple.  Cardiovascular: Normal rate, regular rhythm and  normal heart sounds. Exam reveals no gallop and no friction rub.  No murmur heard. Device site to the left chest appears to be healing without complication.  Pulmonary/Chest: Effort normal and breath sounds normal. No respiratory distress.  Abdominal: Soft. She exhibits no distension. There is no tenderness.  Musculoskeletal: She exhibits no edema or tenderness.  Neurological: She is alert.  Skin: Skin is warm and dry.  Psychiatric: She has a normal mood and affect. Her behavior is normal. Thought content normal.  Nursing note and vitals reviewed.    ED Treatments / Results  Labs (all labs ordered are listed, but only abnormal results are displayed) Labs Reviewed  CBC WITH DIFFERENTIAL/PLATELET - Abnormal; Notable for the following components:      Result Value   WBC 11.5 (*)    Hemoglobin 11.5 (*)    HCT 35.2 (*)    Neutro Abs 7.9 (*)    All other components within normal limits  PROTIME-INR - Abnormal; Notable for the following components:   Prothrombin Time 16.1 (*)    All other components within normal limits  COMPREHENSIVE METABOLIC PANEL - Abnormal; Notable for the following components:   Potassium 2.2 (*)    Chloride 114 (*)    CO2 18 (*)  Glucose, Bld 132 (*)    BUN 38 (*)    Creatinine, Ser 2.71 (*)    Calcium 7.3 (*)    Total Protein 4.1 (*)    Albumin 2.3 (*)    GFR calc non Af Amer 17 (*)    GFR calc Af Amer 20 (*)    All other components within normal limits  TROPONIN I - Abnormal; Notable for the following components:   Troponin I 0.31 (*)    All other components within normal limits  HEPARIN LEVEL (UNFRACTIONATED) - Abnormal; Notable for the following components:   Heparin Unfractionated <0.10 (*)    All other components within normal limits  LIPID PANEL - Abnormal; Notable for the following components:   Triglycerides 170 (*)    HDL 16 (*)    All other components within normal limits  HEMOGLOBIN A1C - Abnormal; Notable for the following components:     Hgb A1c MFr Bld 7.0 (*)    All other components within normal limits  TROPONIN I - Abnormal; Notable for the following components:   Troponin I 0.46 (*)    All other components within normal limits  TROPONIN I - Abnormal; Notable for the following components:   Troponin I 0.45 (*)    All other components within normal limits  TROPONIN I - Abnormal; Notable for the following components:   Troponin I 0.42 (*)    All other components within normal limits  COMPREHENSIVE METABOLIC PANEL - Abnormal; Notable for the following components:   Sodium 132 (*)    Potassium 3.4 (*)    Glucose, Bld 207 (*)    BUN 53 (*)    Creatinine, Ser 3.64 (*)    Calcium 11.1 (*)    AST 163 (*)    ALT 92 (*)    Alkaline Phosphatase 137 (*)    Total Bilirubin 1.5 (*)    GFR calc non Af Amer 12 (*)    GFR calc Af Amer 14 (*)    All other components within normal limits  BASIC METABOLIC PANEL - Abnormal; Notable for the following components:   Sodium 134 (*)    Potassium 3.4 (*)    CO2 20 (*)    Glucose, Bld 157 (*)    BUN 45 (*)    Creatinine, Ser 2.85 (*)    Calcium 10.7 (*)    GFR calc non Af Amer 16 (*)    GFR calc Af Amer 19 (*)    All other components within normal limits  D-DIMER, QUANTITATIVE (NOT AT [facility]) - Abnormal; Notable for the following components:   D-Dimer, Quant 0.82 (*)    All other components within normal limits  URINALYSIS, ROUTINE W REFLEX MICROSCOPIC - Abnormal; Notable for the following components:   APPearance CLOUDY (*)    Hgb urine dipstick SMALL (*)    Protein, ur 30 (*)    Leukocytes, UA LARGE (*)    WBC, UA >50 (*)    Bacteria, UA FEW (*)    All other components within normal limits  CBC - Abnormal; Notable for the following components:   WBC 12.5 (*)    All other components within normal limits  LIPID PANEL - Abnormal; Notable for the following components:   Triglycerides 175 (*)    HDL 24 (*)    All other components within normal limits  GLUCOSE, CAPILLARY -  Abnormal; Notable for the following components:   Glucose-Capillary 192 (*)    All other components within  normal limits  HEPARIN LEVEL (UNFRACTIONATED) - Abnormal; Notable for the following components:   Heparin Unfractionated 0.14 (*)    All other components within normal limits  GLUCOSE, CAPILLARY - Abnormal; Notable for the following components:   Glucose-Capillary 196 (*)    All other components within normal limits  GLUCOSE, CAPILLARY - Abnormal; Notable for the following components:   Glucose-Capillary 171 (*)    All other components within normal limits  GLUCOSE, CAPILLARY - Abnormal; Notable for the following components:   Glucose-Capillary 110 (*)    All other components within normal limits  GLUCOSE, CAPILLARY - Abnormal; Notable for the following components:   Glucose-Capillary 178 (*)    All other components within normal limits  HEPARIN LEVEL (UNFRACTIONATED) - Abnormal; Notable for the following components:   Heparin Unfractionated <0.10 (*)    All other components within normal limits  BASIC METABOLIC PANEL - Abnormal; Notable for the following components:   Potassium 3.2 (*)    Glucose, Bld 132 (*)    BUN 30 (*)    Creatinine, Ser 2.10 (*)    Calcium 11.1 (*)    GFR calc non Af Amer 23 (*)    GFR calc Af Amer 27 (*)    All other components within normal limits  GLUCOSE, CAPILLARY - Abnormal; Notable for the following components:   Glucose-Capillary 116 (*)    All other components within normal limits  GLUCOSE, CAPILLARY - Abnormal; Notable for the following components:   Glucose-Capillary 135 (*)    All other components within normal limits  GLUCOSE, CAPILLARY - Abnormal; Notable for the following components:   Glucose-Capillary 136 (*)    All other components within normal limits  GLUCOSE, CAPILLARY - Abnormal; Notable for the following components:   Glucose-Capillary 120 (*)    All other components within normal limits  GLUCOSE, CAPILLARY - Abnormal;  Notable for the following components:   Glucose-Capillary 143 (*)    All other components within normal limits  BASIC METABOLIC PANEL - Abnormal; Notable for the following components:   Potassium 3.4 (*)    Glucose, Bld 157 (*)    Creatinine, Ser 1.97 (*)    Calcium 11.7 (*)    GFR calc non Af Amer 25 (*)    GFR calc Af Amer 29 (*)    All other components within normal limits  GLUCOSE, CAPILLARY - Abnormal; Notable for the following components:   Glucose-Capillary 173 (*)    All other components within normal limits  GLUCOSE, CAPILLARY - Abnormal; Notable for the following components:   Glucose-Capillary 159 (*)    All other components within normal limits  GLUCOSE, CAPILLARY - Abnormal; Notable for the following components:   Glucose-Capillary 171 (*)    All other components within normal limits  GLUCOSE, CAPILLARY - Abnormal; Notable for the following components:   Glucose-Capillary 109 (*)    All other components within normal limits  CBC - Abnormal; Notable for the following components:   Hemoglobin 15.1 (*)    All other components within normal limits  BASIC METABOLIC PANEL - Abnormal; Notable for the following components:   Glucose, Bld 136 (*)    Creatinine, Ser 1.85 (*)    Calcium 11.7 (*)    GFR calc non Af Amer 28 (*)    GFR calc Af Amer 32 (*)    All other components within normal limits  GLUCOSE, CAPILLARY - Abnormal; Notable for the following components:   Glucose-Capillary 132 (*)  All other components within normal limits  GLUCOSE, CAPILLARY - Abnormal; Notable for the following components:   Glucose-Capillary 161 (*)    All other components within normal limits  PTH, INTACT AND CALCIUM - Abnormal; Notable for the following components:   PTH 109 (*)    Calcium, Total (PTH) 11.7 (*)    All other components within normal limits  VITAMIN D 25 HYDROXY (VIT D DEFICIENCY, FRACTURES) - Abnormal; Notable for the following components:   Vit D, 25-Hydroxy 16.5 (*)      All other components within normal limits  GLUCOSE, CAPILLARY - Abnormal; Notable for the following components:   Glucose-Capillary 161 (*)    All other components within normal limits  GLUCOSE, CAPILLARY - Abnormal; Notable for the following components:   Glucose-Capillary 117 (*)    All other components within normal limits  COMPREHENSIVE METABOLIC PANEL - Abnormal; Notable for the following components:   Glucose, Bld 106 (*)    Creatinine, Ser 1.85 (*)    Calcium 11.8 (*)    ALT 51 (*)    GFR calc non Af Amer 28 (*)    GFR calc Af Amer 32 (*)    All other components within normal limits  GLUCOSE, CAPILLARY - Abnormal; Notable for the following components:   Glucose-Capillary 146 (*)    All other components within normal limits  GLUCOSE, CAPILLARY - Abnormal; Notable for the following components:   Glucose-Capillary 131 (*)    All other components within normal limits  TROPONIN I - Abnormal; Notable for the following components:   Troponin I 0.06 (*)    All other components within normal limits  TROPONIN I - Abnormal; Notable for the following components:   Troponin I 0.05 (*)    All other components within normal limits  TROPONIN I - Abnormal; Notable for the following components:   Troponin I 0.06 (*)    All other components within normal limits  GLUCOSE, CAPILLARY - Abnormal; Notable for the following components:   Glucose-Capillary 122 (*)    All other components within normal limits  GLUCOSE, CAPILLARY - Abnormal; Notable for the following components:   Glucose-Capillary 129 (*)    All other components within normal limits  CBC - Abnormal; Notable for the following components:   RBC 5.20 (*)    All other components within normal limits  GLUCOSE, CAPILLARY - Abnormal; Notable for the following components:   Glucose-Capillary 117 (*)    All other components within normal limits  BASIC METABOLIC PANEL - Abnormal; Notable for the following components:   Sodium 134 (*)     Glucose, Bld 153 (*)    Creatinine, Ser 1.96 (*)    Calcium 12.1 (*)    GFR calc non Af Amer 26 (*)    GFR calc Af Amer 30 (*)    All other components within normal limits  GLUCOSE, CAPILLARY - Abnormal; Notable for the following components:   Glucose-Capillary 154 (*)    All other components within normal limits  GLUCOSE, CAPILLARY - Abnormal; Notable for the following components:   Glucose-Capillary 147 (*)    All other components within normal limits  GLUCOSE, CAPILLARY - Abnormal; Notable for the following components:   Glucose-Capillary 117 (*)    All other components within normal limits  BASIC METABOLIC PANEL - Abnormal; Notable for the following components:   Sodium 133 (*)    CO2 21 (*)    Glucose, Bld 130 (*)    Creatinine, Ser 2.10 (*)  Calcium 11.9 (*)    GFR calc non Af Amer 24 (*)    GFR calc Af Amer 28 (*)    All other components within normal limits  GLUCOSE, CAPILLARY - Abnormal; Notable for the following components:   Glucose-Capillary 196 (*)    All other components within normal limits  GLUCOSE, CAPILLARY - Abnormal; Notable for the following components:   Glucose-Capillary 145 (*)    All other components within normal limits  GLUCOSE, CAPILLARY - Abnormal; Notable for the following components:   Glucose-Capillary 121 (*)    All other components within normal limits  GLUCOSE, CAPILLARY - Abnormal; Notable for the following components:   Glucose-Capillary 123 (*)    All other components within normal limits  BASIC METABOLIC PANEL - Abnormal; Notable for the following components:   Glucose, Bld 111 (*)    Creatinine, Ser 1.98 (*)    Calcium 11.5 (*)    GFR calc non Af Amer 26 (*)    GFR calc Af Amer 30 (*)    All other components within normal limits  GLUCOSE, CAPILLARY - Abnormal; Notable for the following components:   Glucose-Capillary 132 (*)    All other components within normal limits  GLUCOSE, CAPILLARY - Abnormal; Notable for the following  components:   Glucose-Capillary 153 (*)    All other components within normal limits  GLUCOSE, CAPILLARY - Abnormal; Notable for the following components:   Glucose-Capillary 135 (*)    All other components within normal limits  GLUCOSE, CAPILLARY - Abnormal; Notable for the following components:   Glucose-Capillary 138 (*)    All other components within normal limits  GLUCOSE, CAPILLARY - Abnormal; Notable for the following components:   Glucose-Capillary 134 (*)    All other components within normal limits  GLUCOSE, CAPILLARY - Abnormal; Notable for the following components:   Glucose-Capillary 161 (*)    All other components within normal limits  GLUCOSE, CAPILLARY - Abnormal; Notable for the following components:   Glucose-Capillary 102 (*)    All other components within normal limits  GLUCOSE, CAPILLARY - Abnormal; Notable for the following components:   Glucose-Capillary 171 (*)    All other components within normal limits  GLUCOSE, CAPILLARY - Abnormal; Notable for the following components:   Glucose-Capillary 140 (*)    All other components within normal limits  CBC - Abnormal; Notable for the following components:   RBC 5.16 (*)    All other components within normal limits  BASIC METABOLIC PANEL - Abnormal; Notable for the following components:   Sodium 134 (*)    Glucose, Bld 142 (*)    Creatinine, Ser 1.85 (*)    Calcium 11.8 (*)    GFR calc non Af Amer 28 (*)    GFR calc Af Amer 32 (*)    All other components within normal limits  GLUCOSE, CAPILLARY - Abnormal; Notable for the following components:   Glucose-Capillary 130 (*)    All other components within normal limits  GLUCOSE, CAPILLARY - Abnormal; Notable for the following components:   Glucose-Capillary 137 (*)    All other components within normal limits  GLUCOSE, CAPILLARY - Abnormal; Notable for the following components:   Glucose-Capillary 160 (*)    All other components within normal limits  GLUCOSE,  CAPILLARY - Abnormal; Notable for the following components:   Glucose-Capillary 122 (*)    All other components within normal limits  GLUCOSE, CAPILLARY - Abnormal; Notable for the following components:   Glucose-Capillary 113 (*)  All other components within normal limits  GLUCOSE, CAPILLARY - Abnormal; Notable for the following components:   Glucose-Capillary 159 (*)    All other components within normal limits  GLUCOSE, CAPILLARY - Abnormal; Notable for the following components:   Glucose-Capillary 129 (*)    All other components within normal limits  POCT I-STAT, CHEM 8 - Abnormal; Notable for the following components:   Potassium 2.2 (*)    BUN 32 (*)    Creatinine, Ser 2.50 (*)    Glucose, Bld 128 (*)    Calcium, Ion 1.09 (*)    TCO2 18 (*)    Hemoglobin 10.5 (*)    HCT 31.0 (*)    All other components within normal limits  POCT I-STAT TROPONIN I - Abnormal; Notable for the following components:   Troponin i, poc 0.33 (*)    All other components within normal limits  POCT I-STAT, CHEM 8 - Abnormal; Notable for the following components:   Potassium 3.3 (*)    BUN 48 (*)    Creatinine, Ser 3.80 (*)    Glucose, Bld 193 (*)    Calcium, Ion 1.50 (*)    All other components within normal limits  MRSA PCR SCREENING  URINE CULTURE  APTT  SODIUM, URINE, RANDOM  CREATININE, URINE, RANDOM  HEPARIN LEVEL (UNFRACTIONATED)  CBC  CORTISOL  CBC  TSH  T4, FREE  GLUCOSE, CAPILLARY  PTH-RELATED PEPTIDE  BASIC METABOLIC PANEL  I-STAT TROPONIN, ED  I-STAT CHEM 8, ED    EKG EKG Interpretation  Date/Time:  Saturday October 31 2018 12:31:05 EST Ventricular Rate:  60 PR Interval:    QRS Duration: 124 QT Interval:  465 QTC Calculation: 465 R Axis:   -69 Text Interpretation:  Atrial-ventricular dual-paced rhythm No further analysis attempted due to paced rhythm no prior for comparison Confirmed by Gerlene Fee [phone]) on 11/01/2018 12:14:43 PM   Radiology No results  found.  Procedures Procedures (including critical care time)  CRITICAL CARE Performed by: Virgel Manifold Total critical care time: 35 minutes Critical care time was exclusive of separately billable procedures and treating other patients. Critical care was necessary to treat or prevent imminent or life-threatening deterioration. Critical care was time spent personally by me on the following activities: development of treatment plan with patient and/or surrogate as well as nursing, discussions with consultants, evaluation of patient's response to treatment, examination of patient, obtaining history from patient or surrogate, ordering and performing treatments and interventions, ordering and review of laboratory studies, ordering and review of radiographic studies, pulse oximetry and re-evaluation of patient's condition.   Medications Ordered in ED Medications  0.9 %  sodium chloride infusion (has no administration in time range)  heparin ADULT infusion 100 units/mL (25000 units/241mL sodium chloride 0.45%) (has no administration in time range)  heparin bolus via infusion 4,000 Units (has no administration in time range)     Initial Impression / Assessment and Plan / ED Course  I have reviewed the triage vital signs and the nursing notes.  Pertinent labs & imaging results that were available during my care of the patient were reviewed by me and considered in my medical decision making (see chart for details).     67 year old female with some chest discomfort and dyspnea.  Somewhat vague symptoms.  Made a code STEMI in the field.  Her rhythm is actually paced though.  She was evaluated by cardiology in the emergency room.  Giving her ongoing pain, decision was made to proceed emergently for  cardiac catheterization.  Final Clinical Impressions(s) / ED Diagnoses   Final diagnoses:      Chest pain with high risk of acute coronary syndrome    ED Discharge Orders    None       Virgel Manifold, MD 11/11/18 2136

## 2018-10-31 NOTE — Consult Note (Signed)
Renal Service Consult Note [name] 10/31/2018 Melissa Lopez Requesting Physician:  Dr [name]  Reason for Consult:  REnal failure HPI: The Melissa Lopez is a 67 y.o. year-old with hx of IDDM, HTN, CKD stage "two" per pt presented to ED early this am after syncope x 2 and chest pain.  Just had new PPM placed 2 wks prior.  +N/V.  BP's 60's initially and was treated w/ asa, 500 cc NS and fentanyl 75 mg by EMS w/ no relief.  EKG showed 6ST in inferior leads and V4-V6.  STEMI code was called and Melissa Lopez went to cath lab. Cath did not show sig CAD.  During cath it was found out the ED labs came back w/ creat 2.7.  Repeat was Cr 3.8.  Asked to see for renal failure.    Melissa Lopez is alert and cooperative.  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  Lives in [place] visiting her family in [place] and going home "when she is done here".  Denies any difficulty voiding, hematuria or abd pain.  Vomited x 1 early this am.  No SOB , cough, no LE edema. Occ takes nsaids. We don't have a home medication list , pt says she takes home Novolog 70/30 bid and HTN medications.    Melissa Lopez lives w/ her dtr in [place], widowed x 11 yrs.  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  Hx kidney stones.  A1C is usually in the 7's.    ROS  denies CP  no joint pain   no HA  no blurry vision  no rash  no diarrhea  no nausea/ vomiting  no dysuria  no difficulty voiding  no change in urine color    Past Medical History  Past Medical History:  Diagnosis Date  . CKD (chronic kidney disease) stage 3, GFR 30-59 ml/min (HCC) 10/31/2018   - per Melissa Lopez report  . Coronary artery disease    per Melissa Lopez report - 2 caths with non-obstructive CAD  . Essential hypertension   . H/O cardiac pacemaker 10/31/2018  . Type 2 diabetes mellitus with complication, with long-term current use of insulin ([place]) 10/31/2018   with  diabetic nephropathy   Past Surgical History  Past Surgical History:  Procedure Laterality Date  . CARDIAC PACEMAKER PLACEMENT     in [place]   Family History  Family History  Family history unknown: Yes   Social History  reports that she has never smoked. She has never used smokeless tobacco. Her alcohol and drug histories are not on file. Allergies  Allergies  Allergen Reactions  . Percocet [Oxycodone-Acetaminophen]    Home medications Prior to Admission medications   Not on File   Liver Function Tests Recent Labs  Lab 10/31/18 1249  AST 27  ALT 20  ALKPHOS 85  BILITOT 0.8  PROT 4.1*  ALBUMIN 2.3*   No results for input(s): LIPASE, AMYLASE in the last 168 hours. CBC Recent Labs  Lab 10/31/18 1249 10/31/18 1255 10/31/18 1321  WBC 11.5*  --   --   NEUTROABS 7.9*  --   --   HGB 11.5* 10.5* 14.3  HCT 35.2* 31.0* 42.0  MCV 90.7  --   --   PLT 181  --   --    Basic Metabolic Panel Recent Labs  Lab 10/31/18 1249 10/31/18 1255 10/31/18 1321  NA  139 141 135  K 2.2* 2.2* 3.3*  CL 114* 111 98  CO2 18*  --   --   GLUCOSE 132* 128* 193*  BUN 38* 32* 48*  CREATININE 2.71* 2.50* 3.80*  CALCIUM 7.3*  --   --    Iron/TIBC/Ferritin/ %Sat No results found for: IRON, TIBC, FERRITIN, IRONPCTSAT  Vitals:   10/31/18 1339 10/31/18 1344 10/31/18 1349 10/31/18 1553  BP: (!) 146/83 134/81 133/73   Pulse: 64 64 60   Resp: 12 14 14    Temp:    (!) 97.4 F (36.3 C)  TempSrc:    Axillary  SpO2: 97% 98% 97%   Weight:      Height:       Exam Gen alert, obese, pleasant WF no distress, lying flat No rash, cyanosis or gangrene Sclera anicteric, throat clear  No jvd or bruits Chest occ crackles at bases, no wheezing RRR no MRG Abd soft ntnd no mass or ascites +bs obese GU defer MS no joint effusions or deformity Ext no LE or UE edema, no wounds or ulcers, good pulses in feet Neuro is alert, Ox 3 , nf   Na 135  K 3.3  BUN 48  Cr 3.80  CA 7.3  Alb 2.3   LFT's ok   Trop 0.33  WBC 11k  Hb 11.5  plt 181      Impression/ Plan: 1. Renal failure - uncertain baseline in this Melissa Lopez , but best creat may be in the 2's judging by the initial labs.  +contrast exposure this am.  Has room for volume, getting 4 hr saline at 125/ hr now.  Will cont at 75 cc/hr overnight.  Check renal US, UA and urine lytes.   2. Volume - euvolemic 3. DM - takes 70/30 bid at home 4. HTN - not sure what medications she takes 5. Chest pain / +trop - sp L heart cath early this am     Kelly Splinter MD [facility] pager [phone]   10/31/2018, 4:07 PM

## 2018-10-31 NOTE — Consult Note (Signed)
Medical Consultation   [name]  DHR:[id number]  DOB: October 26, 1951  DOA: 10/31/2018  PCP: No primary care provider on file.    Requesting physician: [name]  Reason for consultation: Syncope evaluation  History of Present Illness: Melissa Lopez is an 67 y.o. female with history of type 2 diabetes mellitus with diabetic neuropathy, chronic kidney disease?  Stage II, hypertension, CAD, arthritis requiring bilateral knee replacement presented as code STEMI after a near syncopal event at home.  Patient admitted to cardiology service and underwent emergent cardiac cath and concern for EKG changes and abnormal troponin.  Cardiac cath was unremarkable and did not explain her symptoms.  Medicine service consulted for further evaluation.  Patient is currently post cath and awake/alert oriented.  She is communicative but a poor historian.  Below information obtained by talking to patient as well as daughter [name] at [phone]. It appears that patient has had dizzy spells, described as lightheaded feeling, for a long time and mostly on walking.  She has been walker dependent at baseline due to bilateral arthritis and ataxia/periodic dizziness.  Patient is helping her granddaughter (who is in early weeks of pregnancy) transition from [place] and set up her new home here in [place]. Apparently, patient was in the kitchen yesterday morning doing the dishes when she fell backwards with her eyes open and lost consciousness at least for 3 minutes.  Granddaughter frantically called the daughter in [place] thinking patient died but patient did regain consciousness and insisted on not going to the hospital.  She convince her granddaughter that dizzy spells are not unusual for her.  Subsequently patient had 2 more episodes of near syncope including this morning which prompted granddaughter to call EMS.  The last episode was also associated with some chest discomfort.  Patient reports  to me that she had 3 episodes of vomiting yesterday but according to daughter [name], patient has been vomiting for the last 6 days.  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  She takes PPI at baseline.  When specifically asked about complaints of shortness of breath patient states she has chronic exertional dyspnea with no new changes.  Labs on presentation showed potassium of 2.2, creatinine of 2.7 (repeat level at 3.8), troponin of 0.33 and EKG showed wide-complex paced rhythm with ST changes.  Per my discussion with cardiology, there was no significant abnormalities noted on left heart cath.  Echocardiogram has been ordered and pending.   Review of Systems:  ROS As per HPI otherwise 10 point review of systems negative.     Past Medical History: Past Medical History:  Diagnosis Date  . CKD (chronic kidney disease) stage 3, GFR 30-59 ml/min (HCC) 10/31/2018   - per patient report  . Coronary artery disease    per patient report - 2 caths with non-obstructive CAD  . Essential hypertension   . H/O cardiac pacemaker 10/31/2018  . Type 2 diabetes mellitus with complication, with long-term current use of insulin ([place]) 10/31/2018   with diabetic nephropathy    Past Surgical History: Past Surgical History:  Procedure Laterality Date  . CARDIAC PACEMAKER PLACEMENT     in [place]     Allergies:   Allergies  Allergen Reactions  . Percocet [Oxycodone-Acetaminophen]      Social History:  reports that she has never smoked. She has  never used smokeless tobacco. Her alcohol and drug histories are not on file.   Family History: Patient is a poor historian but reports diabetes in the family.  Physical Exam: Vitals:   10/31/18 1339 10/31/18 1344 10/31/18 1349 10/31/18 1553  BP: (!) 146/83 134/81 133/73   Pulse: 64 64 60   Resp: 12 14 14    Temp:    (!) 97.4 F (36.3 C)    TempSrc:    Axillary  SpO2: 97% 98% 97%   Weight:      Height:        Constitutional: Alert and awake, oriented x3, not in any acute distress. Eyes: PERLA, EOMI, irises appear normal, anicteric sclera,  ENMT: external ears and nose appear normal, normal hearing  Lips appears normal, oropharynx mucosa, tongue, posterior pharynx appear normal  Neck: neck appears normal, no masses, normal ROM, no thyromegaly, no JVD  CVS: S1-S2 clear, no murmur rubs or gallops, no LE edema, normal pedal pulses  Respiratory:  clear to auscultation bilaterally, no wheezing, rales or rhonchi. Respiratory effort normal. No accessory muscle use.  Abdomen: soft nontender, nondistended, normal bowel sounds, no hepatosplenomegaly, no hernias  Musculoskeletal: : no cyanosis, clubbing or edema noted bilaterally. No contractures or atrophy noted Neuro: Cranial nerves II-XII intact, strength, sensation, reflexes Psych: judgement and insight appear sub-normal, stable mood and affect, mental status alert and oriented Skin/Catheters: no rashes or lesions or ulcers noted.  Data reviewed:  I have personally reviewed following labs and imaging studies Labs:  CBC: Recent Labs  Lab 10/31/18 1249 10/31/18 1255 10/31/18 1321  WBC 11.5*  --   --   NEUTROABS 7.9*  --   --   HGB 11.5* 10.5* 14.3  HCT 35.2* 31.0* 42.0  MCV 90.7  --   --   PLT 181  --   --     Basic Metabolic Panel: Recent Labs  Lab 10/31/18 1249 10/31/18 1255 10/31/18 1321 10/31/18 1500  NA 139 141 135 132*  K 2.2* 2.2* 3.3* 3.4*  CL 114* 111 98 98  CO2 18*  --   --  22  GLUCOSE 132* 128* 193* 207*  BUN 38* 32* 48* 53*  CREATININE 2.71* 2.50* 3.80* 3.64*  CALCIUM 7.3*  --   --  11.1*   GFR Estimated Creatinine Clearance: 15.4 mL/min (A) (by C-G formula based on SCr of 3.64 mg/dL (H)). Liver Function Tests: Recent Labs  Lab 10/31/18 1249 10/31/18 1500  AST 27 163*  ALT 20 92*  ALKPHOS 85 137*  BILITOT 0.8 1.5*  PROT 4.1* 6.6   ALBUMIN 2.3* 3.7   No results for input(s): LIPASE, AMYLASE in the last 168 hours. No results for input(s): AMMONIA in the last 168 hours. Coagulation profile Recent Labs  Lab 10/31/18 1249  INR 1.31   Cardiac Enzymes: Recent Labs  Lab 10/31/18 1249 10/31/18 1500  TROPONINI 0.31* 0.46*   BNP: Invalid input(s): POCBNP CBG: No results for input(s): GLUCAP in the last 168 hours.   Hgb A1c Recent Labs    10/31/18 1500  HGBA1C 7.0*   Lipid Profile Recent Labs    10/31/18 1249  CHOL 114  HDL 16*  LDLCALC 64  TRIG 170*  CHOLHDL 7.1   Thyroid function studies No results for input(s): TSH, T4TOTAL, T3FREE, THYROIDAB in the last 72 hours.  Invalid input(s): FREET3 Anemia work up No results for input(s): VITAMINB12, FOLATE, FERRITIN, TIBC, IRON, RETICCTPCT in the last 72 hours. Urinalysis No results found for: COLORURINE, APPEARANCEUR,  LABSPEC, PHURINE, GLUCOSEU, [place], BILIRUBINUR, [place], PROTEINUR, UROBILINOGEN, NITRITE, LEUKOCYTESUR   Microbiology Recent Results (from the past 240 hour(s))  MRSA PCR Screening     Status: None   Collection Time: 10/31/18  2:24 PM  Result Value Ref Range Status   MRSA by PCR NEGATIVE NEGATIVE Final    Comment:        The GeneXpert MRSA Assay (FDA approved for NASAL specimens only), is one component of a comprehensive MRSA colonization surveillance program. It is not intended to diagnose MRSA infection nor to guide or monitor treatment for MRSA infections. Performed at [facility], [place] [address] [place], [place] [address]        Inpatient Medications:   Scheduled Meds: . [START ON 11/01/2018] aspirin  81 mg Oral Daily  . [START ON 11/01/2018] Influenza vac split quadrivalent PF  0.5 mL Intramuscular Tomorrow-1000  . insulin aspart  0-20 Units Subcutaneous TID WC  . insulin aspart  0-5 Units Subcutaneous QHS  . insulin aspart protamine- aspart  12 Units Subcutaneous BID WC  . [START ON 11/01/2018]  pneumococcal 23 valent vaccine  0.5 mL Intramuscular Tomorrow-1000  . potassium chloride  40 mEq Oral Once  . sodium chloride flush  3 mL Intravenous Q12H   Continuous Infusions: . sodium chloride    . sodium chloride    . heparin       Radiological Exams on Admission: No results found.  Impression/Recommendations Principal Problem:   Chest pain with high risk of acute coronary syndrome Active Problems:   H/O cardiac pacemaker   Syncope   ST elevation - with non-obstructive CAD   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HCC)   Type 2 diabetes mellitus with complication, with long-term current use of insulin (HCC)   CKD (chronic kidney disease) stage 3, GFR 30-59 ml/min (HCC) - per patient report   Essential hypertension  1.  Dizzy spells/syncope: Likely secondary to dehydration with underlying chronic orthostasis.  Will discontinue Norvasc as it might be contributing to her chronic symptoms.  Will hold other antihypertensives including lisinopril and hydralazine for now which can be resumed once renal function and blood pressure improved.  Will check orthostatics when patient able to get out of bed (in 3 hours per cardiology).  Patient saturating well on room air, denies acute symptoms of dyspnea although reports some chronic limitation in exercise capacity which could also be related to her knee arthritis.  Can consider VQ scan if d-dimer significantly elevated.  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  Repeat pacemaker check planned by cardiology and any case.  2.  Falls: Patient reports chronic tendency to fall due to buckling of her knees and relates to chronic arthritis for which she is walker dependent at baseline.  PT/OT evaluation.  Check orthostatics as described above/support stockings.  3.  Nausea/vomiting: Likely related to diabetic gastroparesis.  Clear liquid diet  and advance as tolerated.  Can try Reglan before meals if patient has severe symptoms.  4.  Abnormal EKG/elevated troponin: Could be in the setting of problem #3 and severe hypokalemia.  Echo pending.  Cardiac cath unremarkable as described above.  Defer further recommendations to cardiology.  Resume beta-blocker at a low dose with holding parameters.    5.  Severe hypokalemia: Present on admission.Replace as needed.  Repeat levels appears to have improved   6.  Mild leukocytosis: Likely reactive secondary  to problem #3  7.  Acute on chronic kidney disease: Defer management to nephrology.  IV fluids, normal saline at 75/h per nephrology recommendations.  Hold ACE inhibitors.  Obtain baseline labs from [facility] in [place] where she was admitted recently.  8.  Diabetes mellitus, insulin-dependent: Patient reports taking 70/30 insulin 36 units in the morning and 30 units in the evening.  Will resume at a lower dose given variable oral intake and problem #7  9.  Peripheral neuropathy: Resume gabapentin at a lower dose until blood pressure and dizziness improved.  Patient's home medications include: Norvasc 5 mg  Coreg 25 mg twice daily  Neurontin 300 mg 3 times daily  hydralazine 25 mg 3 times daily  lisinopril 40 mg daily  omeprazole 20 mg daily  trazodone 100 mg daily  and 70/30 insulin as described above.  Thank you for this consultation.  Our [facility] hospitalist team will follow the patient with you.   Time Spent: 70 minutes including discussion with specialists and corroborative history obtained from family members   Guilford Shi M.D. [facility] 10/31/2018, 4:49 PM

## 2018-10-31 NOTE — Progress Notes (Signed)
  Echocardiogram 2D Echocardiogram has been performed.  Melissa Lopez 10/31/2018, 5:55 PM

## 2018-10-31 NOTE — ED Triage Notes (Addendum)
Patient arrived from home with complaint of syncope x 2 yesterday and ongoing CP today. Just had pacemaker placed 2 weeks ago. Nausea and vomiting with same, received 324 asa pta. Also received 500cc bolus pta due to reported BP 84X systolic. Alert and oriented on arrival. Patient also received fentanyl 75mg  pta by EMS with no relief

## 2018-10-31 NOTE — H&P (Signed)
Brief note: Full note to follow.  Melissa Lopez is a 67 year old woman with a history of pacemaker placed for "bradycardia "while living in [place].  She is now visiting her granddaughter in [place].  She indicated that over the last 2 days she has had at least 2-3 episodes of syncope where her legs give out and she falls out.  However this morning she woke at 9:00 this morning started having substernal chest discomfort without any true radiation.  There was some nausea vomiting and dyspnea associated with it.  Finally EMS was called and she called 911.  She was seen by [facility] with concerns of elevations in leads II, III, aF as well as V4 through V6.  She was however in paced rhythm.  But based on her chest pain and EKG changes she was brought emergently to [facility] code STEMI called.  Delay to cardiac catheterization was based on this being a second case requiring a second call team to be recruited.  She was initially stabilized in the [facility] emergency room where labs were drawn.  Upon arrival of the Cath Lab team she is brought emergently to the cardiac catheterization lab because of the EKG changes.  Not convinced that this was a STEMI, however with her ongoing pain decided that the best course of action was to proceed with catheterization.    Review of Systems  Constitutional: Positive for malaise/fatigue. Negative for chills, fever and weight loss.  HENT: Negative for congestion and nosebleeds.   Respiratory: Positive for shortness of breath (Today with chest pain). Negative for cough and wheezing.   Cardiovascular: Positive for chest pain. Negative for palpitations.  Gastrointestinal: Positive for nausea. Negative for blood in stool, melena and vomiting.  Genitourinary: Negative for hematuria.  Musculoskeletal: Negative.   Neurological: Positive for dizziness and loss of consciousness.  Psychiatric/Behavioral: Negative for memory loss. The patient is not  nervous/anxious and does not have insomnia.    Pre-physical exam performed: BP 133/73   Pulse 60   Temp (!) 97.3 F (36.3 C) (Axillary)   Resp 14   Ht 5\' 3"  (1.6 m)   Wt 83.9 kg   SpO2 97%   BMI 32.77 kg/m  General appearance: alert, cooperative, moderate distress and morbidly obese Neck: no carotid bruit and no JVD Lungs: clear to auscultation bilaterally and Only able to auscultate anterior and lateral.  Would not set up Heart: RRR with normal S1 and split S2.  Distant heart sounds.  No obvious M/R/G Abdomen: soft, non-tender; bowel sounds normal; no masses,  no organomegaly and Obese Extremities: No clubbing or cyanosis.  No bleeding or bruising Pulses: Weak but palpable peripheral pulses radial and pedal. Neurologic: Mental status: Alert, oriented, thought content appropriate   Patient has had cardiac catheterization before showing minimal CAD.  I have reviewed with her the cardiac catheterization procedure as well as risks, benefits alternatives and indications.  Emergency consent was implied and she was taken to the Cath Lab.  Initial attempt to radial access were unsuccessful despite ultrasound guidance, switch to femoral access.  After taking pictures of the left coronary artery, her labs from the ER i-STAT resulted creatinine of 3.8.    Cardiac catheterization did not reveal significant coronary disease.  There may have been a very very small branch off of the circumflex OM that was occluded but this would be way too small for PCI. -As PE remains possible in the differential diagnosis, will restart heparin 8  hours after sheath removal pending evaluation for PE and for the small sidebranch with no other obvious cause for chest pain.   Glenetta Hew, M.D., M.S. Interventional Cardiologist   Pager # [phone] Phone # [phone] [address] [place], [place] [address]

## 2018-10-31 NOTE — Progress Notes (Signed)
   10/31/18 1230  Clinical Encounter Type  Visited With Patient not available  Visit Type Initial  Referral From Nurse  Consult/Referral To Chaplain  The chaplain responded to [facility] page in ED Rm 31.  Upon checking with medical team, the Pt. was already moved to the Cath Lab.  At the time of the visit, family was not present.  The chaplain will F/U with spiritual care as needed.

## 2018-10-31 NOTE — Progress Notes (Addendum)
ANTICOAGULATION CONSULT NOTE - Initial Consult  Pharmacy Consult for heparin Indication: chest pain/ACS, rule out PE  Allergies  Allergen Reactions  . Percocet [Oxycodone-Acetaminophen]     Patient Measurements: Height: 5\' 3"  (160 cm) Weight: 185 lb (83.9 kg) IBW/kg (Calculated) : 52.4 Heparin Dosing Weight: 84kg  Vital Signs: Temp: 97.3 F (36.3 C) (11/23 1235) Temp Source: Axillary (11/23 1235) BP: 133/73 (11/23 1349) Pulse Rate: 60 (11/23 1349)  Labs: Recent Labs    10/31/18 1249 10/31/18 1255 10/31/18 1321  HGB 11.5* 10.5* 14.3  HCT 35.2* 31.0* 42.0  PLT 181  --   --   APTT 29  --   --   LABPROT 16.1*  --   --   INR 1.31  --   --   HEPARINUNFRC <0.10*  --   --   CREATININE 2.71* 2.50* 3.80*  TROPONINI 0.31*  --   --     Estimated Creatinine Clearance: 14.7 mL/min (A) (by C-G formula based on SCr of 3.8 mg/dL (H)).   Medical History: Past Medical History:  Diagnosis Date  . CKD (chronic kidney disease) stage 3, GFR 30-59 ml/min (HCC) 10/31/2018   - per patient report  . Coronary artery disease    per patient report - 2 caths with non-obstructive CAD  . Essential hypertension   . H/O cardiac pacemaker 10/31/2018  . Type 2 diabetes mellitus with complication, with long-term current use of insulin ([place]) 10/31/2018   with diabetic nephropathy   Assessment: 67 year old female admitted with chest pain, syncope, and possible stemi. Taken urgently to lab. No cad was seen, possible occlusion of small branch but that would have been too small to stent. PE remains on differential and will start IV heparin and rule out.   Goal of Therapy:  Heparin level 0.3-0.7 units/ml Monitor platelets by anticoagulation protocol: Yes   Plan:  Start heparin infusion at 1000 units/hr Check anti-Xa level in 8 hours and daily while on heparin Continue to monitor H&H and platelets  Erin Hearing PharmD., BCPS Clinical Pharmacist 10/31/2018 2:49 PM

## 2018-10-31 NOTE — ED Notes (Signed)
Cardiology [name] at bedside

## 2018-10-31 NOTE — H&P (Signed)
Cardiology Admission History and Physical:   Patient ID: Melissa Lopez MRN: [id number]; DOB: July 19, 1951   Admission date: 10/31/2018  Primary Care Provider: No primary care provider on file. Primary Cardiologist: No primary care provider on file. [facility] [address], [place], [place].  [phone])  Primary Electrophysiologist:  None [facility] [address] [place], [place].  [phone])  Chief Complaint: Chest pain/syncope  Patient Profile:   Britainy Lopez is a 67 y.o. female with long-standing diabetes with nephropathy as well as CKD-3 who is status post [place] pacemaker with a recent generator change on October 3 [facility]).  She has been visiting her pregnant granddaughter in [place], but lives in [place]. She was brought in by [facility] for chest pain with EKG concerning for ST elevation MI.  Code STEMI was called.  History of Present Illness: --Obtained from the patient's daughter post catheterization via telephone   [name] has been having about 6 days of nausea and vomiting and feeling generally poor.  She had 2 episodes of passing out yesterday.  By report the first episode she felt her legs give out and she collapsed.  Eyes were open.  Second episode later on that day similar initial symptoms but she when she fell outside her eyes were closed.  Each time she was out for less than a minute or 2.  She did not want to go to the emergency room yesterday, but this morning when again she had another episode of collapse associated with chest pain, EMS was called.  Upon arrival to the ER she was having 8-10 out of 10 pain, and therefore with the EKG changes showing possible ST elevations, she was taken emergently to cardiac catheterization lab. (At the time of taking the catheterization the only medical issue noted was diabetes with CKD 3 but no baseline creatinine).  Unfortunately labs were not yet available upon  arrival to the Cath Lab.   Past Medical History:  Diagnosis Date  . CKD (chronic kidney disease) stage 3, GFR 30-59 ml/min (HCC) 10/31/2018   - per patient report  . Coronary artery disease    per patient report - 2 caths with non-obstructive CAD  . Essential hypertension   . H/O cardiac pacemaker 10/31/2018  . Type 2 diabetes mellitus with complication, with long-term current use of insulin ([place]) 10/31/2018   with diabetic nephropathy    Past Surgical History:  Procedure Laterality Date  . CARDIAC PACEMAKER PLACEMENT     in [place]     Medications Prior to Admission: Unknown at the time of catheterization. Patient has a list of amlodipine 5 mg daily, carvedilol 25 mg twice daily, gabapentin 300 mg 3 times daily, hydralazine 25 mg 3 times daily, lisinopril 40 mg daily.  Omeprazole 20 mg daily, trazodone 100 mg daily. Prior to Admission medications   Not on File     Allergies:    Allergies  Allergen Reactions  . Percocet [Oxycodone-Acetaminophen]     Social History:   Social History   Tobacco Use  . Smoking status: Never Smoker  . Smokeless tobacco: Never Used  Substance Use Topics  . Alcohol use: Not on file  . Drug use: Not on file   Social History   Social History Narrative   Visiting her granddaughter in [place] - lives in [place].    Family History:   The patient's Family history is unknown by patient.    ROS: Please see  the history of present illness.  Review of Systems  Constitutional: Positive for malaise/fatigue (sick for last 6 d). Negative for chills and fever.  HENT: Negative for congestion and nosebleeds.   Respiratory: Negative for cough, sputum production, shortness of breath and wheezing.   Cardiovascular: Negative for orthopnea, leg swelling and PND.  Gastrointestinal: Positive for nausea and vomiting. Negative for abdominal pain.  Genitourinary: Negative for dysuria and hematuria.  Musculoskeletal: Negative for back pain and joint pain.    Neurological: Positive for loss of consciousness (per HPI).  Psychiatric/Behavioral: Negative for depression and memory loss. The patient is not nervous/anxious and does not have insomnia.     Physical Exam/Data:   Vitals:   10/31/18 1334 10/31/18 1339 10/31/18 1344 10/31/18 1349  BP: (!) 144/83 (!) 146/83 134/81 133/73  Pulse: 60 64 64 60  Resp: 15 12 14 14   Temp:      TempSrc:      SpO2: 95% 97% 98% 97%  Weight:      Height:        Intake/Output Summary (Last 24 hours) at 10/31/2018 1515 Last data filed at 10/31/2018 1226 Gross per 24 hour  Intake 500 ml  Output -  Net 500 ml   Filed Weights   10/31/18 1233  Weight: 83.9 kg   Body mass index is 32.77 kg/m.  General:  Well nourished, well developed, in moderate distress. HEENT: normal; poor dentition.  Appears to have a dry thick mouth.  Otherwise [place]/AT, EOMI. Lymph: no adenopathy Neck: no JVD Endocrine:  No thryomegaly Vascular: No carotid bruits; FA pulses 1+ bilaterally without bruits  Cardiac:  normal S1, split S2; RRR; no obvious M/R/G Lungs:  clear to auscultation bilaterally, no wheezing, rhonchi or rales  Abd: soft, nontender, no hepatomegaly; obese Ext: no edema Musculoskeletal:  No deformities, BUE and BLE strength normal and equal Skin: warm and dry  Neuro:  CNs 2-12 intact, no focal abnormalities noted Psych:  Normal affect   EKG:  The ECG that was done by EMS and in ER was personally reviewed and demonstrates AV pacing rate of 60 bpm.  Suspicious ST elevations in II, 3, aVF as well as V4 through V6 with depressions in I and aVL.  Cannot exclude STEMI  Relevant CV Studies: -After arrival to the CCU, telephone conversation with the daughter revealed that she had a Immunologist at [facility] in [place] on October 3.  This was then followed up on October 21 with a pacemaker check ([facility] [address] [place]  [phone])  Laboratory  Data:  Chemistry Recent Labs  Lab 10/31/18 1249 10/31/18 1255 10/31/18 1321  NA 139 141 135  K 2.2* 2.2* 3.3*  CL 114* 111 98  CO2 18*  --   --   GLUCOSE 132* 128* 193*  BUN 38* 32* 48*  CREATININE 2.71* 2.50* 3.80*  CALCIUM 7.3*  --   --   GFRNONAA 17*  --   --   GFRAA 20*  --   --   ANIONGAP 7  --   --     Recent Labs  Lab 10/31/18 1249  PROT 4.1*  ALBUMIN 2.3*  AST 27  ALT 20  ALKPHOS 85  BILITOT 0.8   Hematology Recent Labs  Lab 10/31/18 1249 10/31/18 1255 10/31/18 1321  WBC 11.5*  --   --   RBC 3.88  --   --   HGB 11.5* 10.5* 14.3  HCT 35.2*  31.0* 42.0  MCV 90.7  --   --   MCH 29.6  --   --   MCHC 32.7  --   --   RDW 12.1  --   --   PLT 181  --   --    Cardiac Enzymes Recent Labs  Lab 10/31/18 1249  TROPONINI 0.31*    Recent Labs  Lab 10/31/18 1253  TROPIPOC 0.33*    BNPNo results for input(s): BNP, PROBNP in the last 168 hours.  DDimer No results for input(s): DDIMER in the last 168 hours.  Radiology/Studies:  No results found.  Assessment and Plan:   Principal Problem:   Chest pain with high risk of acute coronary syndrome Active Problems:   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HCC)   Type 2 diabetes mellitus with complication, with long-term current use of insulin (HCC)   ST elevation - with non-obstructive CAD   CKD (chronic kidney disease) stage 3, GFR 30-59 ml/min (HCC) - per patient report   H/O cardiac pacemaker   Syncope   Essential hypertension   Patient has had cardiac catheterization before showing minimal CAD.  I have reviewed with her the cardiac catheterization procedure as well as risks, benefits alternatives and indications.  Emergency consent was implied and she was taken to the Cath Lab.  Initial attempt to radial access were unsuccessful despite ultrasound guidance, switch to femoral access.  After taking pictures of the left coronary artery, her labs from the ER i-STAT resulted creatinine of 3.8.     Cardiac catheterization did not reveal significant coronary disease.  There may have been a very very small branch off of the circumflex OM that was occluded but this would be way too small for PCI. -As PE remains possible in the differential diagnosis, will restart heparin 8 hours after sheath removal pending evaluation for PE and for the small sidebranch with no other obvious cause for chest pain.  Based on presentation, she will be admitted to the ICU but would have her stepdown status.  Since were not clear of the etiology of her chest pain, will restart IV heparin 8 hours after sheath removal until we are able to exclude PE. Also for the possible small branch occlusion.  Check 2D echocardiogram to exclude pericardial effusion with possible pericarditis as etiology.  Also need to evaluate her cause for syncope. She did not have chest pain with syncope and denies agree with this chest pain so they probably are not related.  Check 2D echocardiogram  Interrogate pacemaker  Monitor for arrhythmias on telemetry Recommend Aspirin 81mg  daily for moderate CAD.  With renal insufficiency, will need to consider having internal medicine/nephrology follow especially with her diabetes. -Quite likely multiple noncardiac issues.   Further plans: We will consult internal medicine based on what seems a more complicated medical history with diabetes and renal insufficiency.  Also 6 days of nausea vomiting is somewhat concerning.  I suspect that the syncopal episode was related to hypokalemia renal insufficiency following nausea vomiting.  Would hold most of antihypertensives with exception of beta-blocker using hold parameters. Sliding scale insulin. Aspirin.   Severity of Illness: The appropriate patient status for this patient is INPATIENT. Inpatient status is judged to be reasonable and necessary in order to provide the required intensity of service to ensure the patient's safety. The patient's  presenting symptoms, physical exam findings, and initial radiographic and laboratory data in the context of their chronic comorbidities is felt to place them at high risk  for further clinical deterioration. Furthermore, it is not anticipated that the patient will be medically stable for discharge from the hospital within 2 midnights of admission. The following factors support the patient status of inpatient.   " The patient's presenting symptoms include chest pain, syncope, 6d N/V. " The worrisome physical exam findings include faint pulses, difficult IV stick. " The initial radiographic and laboratory data are worrisome because of EKG with ? InferioLateral STE. " The chronic co-morbidities include DM-2 on Insulin with PN & CKD-3.   * I certify that at the point of admission it is my clinical judgment that the patient will require inpatient hospital care spanning beyond 2 midnights from the point of admission due to high intensity of service, high risk for further deterioration and high frequency of surveillance required.*    For questions or updates, please contact [place] Please consult [email or web address] for contact info under        Signed, Glenetta Hew, MD  10/31/2018 3:15 PM

## 2018-11-01 ENCOUNTER — Inpatient Hospital Stay (HOSPITAL_COMMUNITY): Payer: Medicare Other

## 2018-11-01 DIAGNOSIS — E1121 Type 2 diabetes mellitus with diabetic nephropathy: Secondary | ICD-10-CM

## 2018-11-01 DIAGNOSIS — R55 Syncope and collapse: Secondary | ICD-10-CM

## 2018-11-01 DIAGNOSIS — R079 Chest pain, unspecified: Secondary | ICD-10-CM

## 2018-11-01 DIAGNOSIS — Z794 Long term (current) use of insulin: Secondary | ICD-10-CM

## 2018-11-01 LAB — CBC
HCT: 42 % (ref 36.0–46.0)
HEMOGLOBIN: 14.1 g/dL (ref 12.0–15.0)
MCH: 29.8 pg (ref 26.0–34.0)
MCHC: 33.6 g/dL (ref 30.0–36.0)
MCV: 88.8 fL (ref 80.0–100.0)
Platelets: 175 10*3/uL (ref 150–400)
RBC: 4.73 MIL/uL (ref 3.87–5.11)
RDW: 12.1 % (ref 11.5–15.5)
WBC: 12.5 10*3/uL — ABNORMAL HIGH (ref 4.0–10.5)
nRBC: 0 % (ref 0.0–0.2)

## 2018-11-01 LAB — BASIC METABOLIC PANEL
ANION GAP: 12 (ref 5–15)
BUN: 45 mg/dL — AB (ref 8–23)
CO2: 20 mmol/L — ABNORMAL LOW (ref 22–32)
Calcium: 10.7 mg/dL — ABNORMAL HIGH (ref 8.9–10.3)
Chloride: 102 mmol/L (ref 98–111)
Creatinine, Ser: 2.85 mg/dL — ABNORMAL HIGH (ref 0.44–1.00)
GFR calc Af Amer: 19 mL/min — ABNORMAL LOW (ref >60)
GFR calc non Af Amer: 16 mL/min — ABNORMAL LOW (ref >60)
GLUCOSE: 157 mg/dL — AB (ref 70–99)
POTASSIUM: 3.4 mmol/L — AB (ref 3.5–5.1)
SODIUM: 134 mmol/L — AB (ref 135–145)

## 2018-11-01 LAB — ECHOCARDIOGRAM COMPLETE
Height: 63 in
Weight: 2960 oz

## 2018-11-01 LAB — LIPID PANEL
CHOL/HDL RATIO: 6.4 ratio
CHOLESTEROL: 153 mg/dL (ref 0–200)
HDL: 24 mg/dL — ABNORMAL LOW (ref >40)
LDL Cholesterol: 94 mg/dL (ref 0–99)
TRIGLYCERIDES: 175 mg/dL — AB (ref <150)
VLDL: 35 mg/dL (ref 0–40)

## 2018-11-01 LAB — GLUCOSE, CAPILLARY
GLUCOSE-CAPILLARY: 171 mg/dL — AB (ref 70–99)
GLUCOSE-CAPILLARY: 110 mg/dL — AB (ref 70–99)
GLUCOSE-CAPILLARY: 178 mg/dL — AB (ref 70–99)
GLUCOSE-CAPILLARY: 116 mg/dL — AB (ref 70–99)

## 2018-11-01 LAB — TROPONIN I: TROPONIN I: 0.42 ng/mL — AB (ref <0.03)

## 2018-11-01 LAB — HEPARIN LEVEL (UNFRACTIONATED)
Heparin Unfractionated: 0.14 IU/mL — ABNORMAL LOW (ref 0.30–0.70)
Heparin Unfractionated: 0.39 IU/mL (ref 0.30–0.70)

## 2018-11-01 MED ORDER — SODIUM CHLORIDE 0.9 % IV SOLN
1.0000 g | Freq: Every day | INTRAVENOUS | Status: DC
  Administered 2018-11-01 – 2018-11-04 (×4): 1 g via INTRAVENOUS
  Filled 2018-11-01 (×4): qty 10

## 2018-11-01 MED ORDER — TECHNETIUM TO 99M ALBUMIN AGGREGATED
4.3800 | Freq: Once | INTRAVENOUS | Status: AC | PRN
  Administered 2018-11-01: 4.38 via INTRAVENOUS

## 2018-11-01 MED ORDER — TECHNETIUM TC 99M DIETHYLENETRIAME-PENTAACETIC ACID
31.8000 | Freq: Once | INTRAVENOUS | Status: AC | PRN
  Administered 2018-11-01: 31.8 via RESPIRATORY_TRACT

## 2018-11-01 NOTE — Progress Notes (Signed)
[facility] Progress Note  Subjective: up in chair, voiding " a lot".   Vitals:   11/01/18 0300 11/01/18 0400 11/01/18 0500 11/01/18 0600  BP: 123/75 (!) 122/97 126/71 (!) 112/50  Pulse: 63 (!) 59 64 80  Resp: 19 19 19 18   Temp: 98.9 F (37.2 C)     TempSrc: Oral     SpO2: 94% 94% 93% 94%  Weight:   87.3 kg   Height:        Inpatient medications: . aspirin  81 mg Oral Daily  . Influenza vac split quadrivalent PF  0.5 mL Intramuscular Tomorrow-1000  . insulin aspart  0-20 Units Subcutaneous TID WC  . insulin aspart  0-5 Units Subcutaneous QHS  . insulin aspart protamine- aspart  12 Units Subcutaneous BID WC  . pneumococcal 23 valent vaccine  0.5 mL Intramuscular Tomorrow-1000  . potassium chloride  40 mEq Oral Once  . sodium chloride flush  3 mL Intravenous Q12H   . sodium chloride    . heparin 1,000 Units/hr (10/31/18 2300)   sodium chloride, albuterol, morphine injection, ondansetron (ZOFRAN) IV, sodium chloride flush  Iron/TIBC/Ferritin/ %Sat No results found for: IRON, TIBC, FERRITIN, IRONPCTSAT  Exam: Gen alert, obese, pleasant WF no distress No rash, cyanosis or gangrene Sclera anicteric, throat clear  No jvd or bruits Chest occ crackles at bases, no wheezing RRR no MRG Abd soft ntnd no mass or ascites +bs obese GU defer MS no joint effusions or deformity Ext no LE or UE edema, no wounds or ulcers, good pulses in feet Neuro is alert, Ox 3 , nf   Renal US > 12- 13 cm kidneys , no hydro, normal bladder  UA > 50 wbc, no rbc, 30 prot  UNa 29, UCr 176   Impression/ Plan: 1. Renal failure - uncertain baseline in this patient, creat 2.7 on admission here then got IV contrast for heart cath. Renal US wnl, UA +wbc's only.  Pt is from out of town, here visiting family. Labs pending today, clinically stable. Cont to follow.  2. Volume - euvolemic on exam, stable 3. DM - takes 70/30 bid at home 4. HTN - on 4 antiHTN meds at home 5. Chest pain /  +trop - sp L heart cath 11/23, no sig CAD    Kelly Splinter MD [facility] pager [phone]   11/01/2018, 6:43 AM   Recent Labs  Lab 10/31/18 1249  10/31/18 1321 10/31/18 1500  NA 139   < > 135 132*  K 2.2*   < > 3.3* 3.4*  CL 114*   < > 98 98  CO2 18*  --   --  22  GLUCOSE 132*   < > 193* 207*  BUN 38*   < > 48* 53*  CREATININE 2.71*   < > 3.80* 3.64*  CALCIUM 7.3*  --   --  11.1*  ALBUMIN 2.3*  --   --  3.7  INR 1.31  --   --   --    < > = values in this interval not displayed.   Recent Labs  Lab 10/31/18 1249 10/31/18 1500  AST 27 163*  ALT 20 92*  ALKPHOS 85 137*  BILITOT 0.8 1.5*  PROT 4.1* 6.6   Recent Labs  Lab 10/31/18 1249  10/31/18 1321 11/01/18 0516  WBC 11.5*  --   --  12.5*  NEUTROABS 7.9*  --   --   --   HGB 11.5*   < >  14.3 14.1  HCT 35.2*   < > 42.0 42.0  MCV 90.7  --   --  88.8  PLT 181  --   --  175   < > = values in this interval not displayed.

## 2018-11-01 NOTE — Progress Notes (Signed)
[facility] for Heparin Indication: chest pain/ACS, rule out PE  Allergies  Allergen Reactions  . Percocet [Oxycodone-Acetaminophen]     Patient Measurements: Height: 5\' 3"  (160 cm) Weight: 192 lb 7.4 oz (87.3 kg) IBW/kg (Calculated) : 52.4 Heparin Dosing Weight: 84kg  Vital Signs: Temp: 98.2 F (36.8 C) (11/24 1509) Temp Source: Oral (11/24 1509) BP: 142/75 (11/24 1509) Pulse Rate: 64 (11/24 1400)  Labs: Recent Labs    10/31/18 1249 10/31/18 1255 10/31/18 1321 10/31/18 1500 10/31/18 2052 11/01/18 0516 11/01/18 1525  HGB 11.5* 10.5* 14.3  --   --  14.1  --   HCT 35.2* 31.0* 42.0  --   --  42.0  --   PLT 181  --   --   --   --  175  --   APTT 29  --   --   --   --   --   --   LABPROT 16.1*  --   --   --   --   --   --   INR 1.31  --   --   --   --   --   --   HEPARINUNFRC <0.10*  --   --   --   --  0.14* 0.39  CREATININE 2.71* 2.50* 3.80* 3.64*  --  2.85*  --   TROPONINI 0.31*  --   --  0.46* 0.45* 0.42*  --     Estimated Creatinine Clearance: 20.1 mL/min (A) (by C-G formula based on SCr of 2.85 mg/dL (H)).   Medical History: Past Medical History:  Diagnosis Date  . CKD (chronic kidney disease) stage 3, GFR 30-59 ml/min (HCC) 10/31/2018   - per patient report  . Coronary artery disease    per patient report - 2 caths with non-obstructive CAD  . Essential hypertension   . H/O cardiac pacemaker 10/31/2018  . Type 2 diabetes mellitus with complication, with long-term current use of insulin ([place]) 10/31/2018   with diabetic nephropathy   Assessment: 67 year old female admitted with chest pain, syncope, and possible stemi. Taken urgently to lab. No CAD was seen, possible occlusion of small branch but that would have been too small to stent. PE remains on differential and will start IV heparin and rule out.   Heparin level this afternoon came back therapeutic at 0.39, on 1150 units/hr. CBC stable this morning. No infusion  issues. No s/sx of bleeding.   Goal of Therapy:  Heparin level 0.3-0.7 units/ml Monitor platelets by anticoagulation protocol: Yes   Plan:  Inc heparin to 1150 units/hr Check anti-Xa level in 8 hours and daily while on heparin Continue to monitor H&H and platelets  Antonietta Jewel, PharmD Clinical Pharmacist  Pager: [phone] Phone: [phone]

## 2018-11-01 NOTE — Progress Notes (Addendum)
Progress Note  Patient Name: Melissa Lopez Date of Encounter: 11/01/2018  Primary Cardiologist: Followed in [place]/ Dr [name]  Subjective   No CP or dyspnea; complains of "weak".  Inpatient Medications    Scheduled Meds: . aspirin  81 mg Oral Daily  . Influenza vac split quadrivalent PF  0.5 mL Intramuscular Tomorrow-1000  . insulin aspart  0-20 Units Subcutaneous TID WC  . insulin aspart  0-5 Units Subcutaneous QHS  . insulin aspart protamine- aspart  12 Units Subcutaneous BID WC  . pneumococcal 23 valent vaccine  0.5 mL Intramuscular Tomorrow-1000  . potassium chloride  40 mEq Oral Once  . sodium chloride flush  3 mL Intravenous Q12H   Continuous Infusions: . sodium chloride    . heparin 1,150 Units/hr (11/01/18 0655)   PRN Meds: sodium chloride, albuterol, morphine injection, ondansetron (ZOFRAN) IV, sodium chloride flush   Vital Signs    Vitals:   11/01/18 0300 11/01/18 0400 11/01/18 0500 11/01/18 0600  BP: 123/75 (!) 122/97 126/71 (!) 112/50  Pulse: 63 (!) 59 64 80  Resp: 19 19 19 18   Temp: 98.9 F (37.2 C)     TempSrc: Oral     SpO2: 94% 94% 93% 94%  Weight:   87.3 kg   Height:        Intake/Output Summary (Last 24 hours) at 11/01/2018 0756 Last data filed at 11/01/2018 0600 Gross per 24 hour  Intake 1896.41 ml  Output 800 ml  Net 1096.41 ml   Filed Weights   10/31/18 1233 11/01/18 0500  Weight: 83.9 kg 87.3 kg    Telemetry    Sinus with intermittent atrial and ventricular pacing and rare PVC.- Personally Reviewed   Physical Exam   GEN: No acute distress.   Neck: No JVD Cardiac: RRR, no murmurs, rubs, or gallops.  Respiratory: Clear to auscultation bilaterally. GI: Soft, nontender, non-distended, right groin with no hematoma and no bruit. MS: No edema Neuro:  Nonfocal  Psych: Normal affect   Labs    Chemistry Recent Labs  Lab 10/31/18 1249  10/31/18 1321 10/31/18 1500 11/01/18 0516  NA 139   < > 135 132* 134*  K 2.2*   < >  3.3* 3.4* 3.4*  CL 114*   < > 98 98 102  CO2 18*  --   --  22 20*  GLUCOSE 132*   < > 193* 207* 157*  BUN 38*   < > 48* 53* 45*  CREATININE 2.71*   < > 3.80* 3.64* 2.85*  CALCIUM 7.3*  --   --  11.1* 10.7*  PROT 4.1*  --   --  6.6  --   ALBUMIN 2.3*  --   --  3.7  --   AST 27  --   --  163*  --   ALT 20  --   --  92*  --   ALKPHOS 85  --   --  137*  --   BILITOT 0.8  --   --  1.5*  --   GFRNONAA 17*  --   --  12* 16*  GFRAA 20*  --   --  14* 19*  ANIONGAP 7  --   --  12 12   < > = values in this interval not displayed.     Hematology Recent Labs  Lab 10/31/18 1249 10/31/18 1255 10/31/18 1321 11/01/18 0516  WBC 11.5*  --   --  12.5*  RBC 3.88  --   --  4.73  HGB 11.5* 10.5* 14.3 14.1  HCT 35.2* 31.0* 42.0 42.0  MCV 90.7  --   --  88.8  MCH 29.6  --   --  29.8  MCHC 32.7  --   --  33.6  RDW 12.1  --   --  12.1  PLT 181  --   --  175    Cardiac Enzymes Recent Labs  Lab 10/31/18 1249 10/31/18 1500 10/31/18 2052 11/01/18 0516  TROPONINI 0.31* 0.46* 0.45* 0.42*    Recent Labs  Lab 10/31/18 1253  TROPIPOC 0.33*     DDimer  Recent Labs  Lab 10/31/18 1526  DDIMER 0.82*     Radiology    US Renal  Result Date: 11/01/2018 CLINICAL DATA:  Acute renal failure. EXAM: RENAL / URINARY TRACT ULTRASOUND COMPLETE COMPARISON:  None. FINDINGS: Right Kidney: Renal measurements: 11.6 x 7.6 x 7.1 cm = volume: 326 mL. Multiple cysts throughout the right kidney. No hydronephrosis. Left Kidney: Renal measurements: 13.3 x 6.5 x 5 cm = volume: 225 mL. Multiple cysts in the left kidney. No hydronephrosis. Bladder: No wall thickening or filling defects identified. IMPRESSION: Multiple bilateral renal cysts. 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 No hydronephrosis. Electronically Signed   By: Lucienne Capers M.D.   On: 11/01/2018 02:04    Patient Profile     67 y.o. female with past medical history of pacemaker, diabetes mellitus, chronic stage III kidney disease, hypertension  admitted with chest pain and syncope.  Cardiac catheterization revealed 70% ostial ramus relatively small in caliber, 60% ostial diagonal, and very small occluded first marginal.  There was no likely culprit for angina.  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  Assessment & Plan    1 chest pain-troponin minimally elevated but no clear trend and in the setting of renal insufficiency.  Cardiac catheterization did not reveal coronary disease as a cause.  D-dimer mildly elevated.  Check VQ scan.  Would discontinue heparin if VQ scan low probability.  2 chronic stage III kidney disease-nephrology following.  Patient is status post cardiac catheterization.  Recheck creatinine tomorrow morning.  3 syncope-etiology unclear.  Felt possibly secondary to dehydration superimposed on chronic underlying orthostasis.  Pacemaker apparently interrogated with no significant abnormalities.  We will need final results which are not available at this time.  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  There is severe left ventricular hypertrophy most prominent at the apex.  Apical variant hypertrophic cardiomyopathy possible.  I would like to perform cardiac MRI but not possible given severity of renal insufficiency.  As outlined above we will need to FU on pacemaker interrogation to exclude significant arrhythmia as a cause of syncope.  Patient states she was told she had an inherited cardiac problem.  I have no records available and she is very vague about her history.  Would like to obtain records from [place].  5 hypokalemia-supplement  6 hypertension-patient's blood pressure medications are on hold.  Blood pressure remains  borderline. We will continue to hold.  Transfer to telemetry.  For questions or updates, please contact [place] Please consult [email or web address] for contact info under        Signed, Kirk Ruths, MD  11/01/2018, 7:56 AM

## 2018-11-01 NOTE — Progress Notes (Signed)
PROGRESS NOTE    Melissa Lopez  DDU:[id number] DOB: [birth year] DOA: 10/31/2018 PCP: No primary care provider on file.  Brief Narrative:  Melissa Lopez is an 67 y.o. female with history of type 2 diabetes mellitus with diabetic neuropathy, chronic kidney disease?  Stage II, hypertension, CAD, arthritis requiring bilateral knee replacement presented as code STEMI after a near syncopal event at home.  Patient admitted to cardiology service and underwent emergent cardiac cath and concern for EKG changes and abnormal troponin.  Cardiac cath was unremarkable and did not explain her symptoms.  Medicine service consulted for further evaluation.  Patient has had dizzy spells, described as lightheaded feeling, for a long time and mostly on walking.  She has been walker dependent at baseline due to bilateral arthritis and ataxia/periodic dizziness. The last episode was also associated with some chest discomfort. 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  She takes PPI at baseline. She has chronic exertional dyspnea with no new changes.  Labs on presentation showed potassium of 2.2, creatinine of 2.7 (repeat level at 3.8), troponin of 0.33 and EKG showed wide-complex paced rhythm with ST changes.     Assessment & Plan:   Principal Problem:   Chest pain with high risk of acute coronary syndrome Active Problems:   H/O cardiac pacemaker   Syncope   ST elevation - with non-obstructive CAD   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HCC)   Type 2 diabetes mellitus with complication, with long-term current use of insulin (HCC)   CKD (chronic kidney disease) stage 3, GFR 30-59 ml/min (HCC) - per patient report   Essential hypertension   1.  Dizzy spells/syncope: Likely secondary to dehydration with underlying chronic orthostasis.   - Discontinued Norvasc as it might be contributing to her  chronic symptoms.  - Holding other antihypertensives including lisinopril and hydralazine for now which can be resumed once renal function and blood pressure improved.   -Continue to monitor orthostatic vitals.   - Patient saturating well on room air, denies acute symptoms of dyspnea although reports some chronic limitation in exercise capacity which could also be related to her knee arthritis.  -D-dimer mildly elevated.  VQ scan ordered per cardiology.  Discontinue heparin because the VQ scan negative. - Patient has [facility] pacemaker and had a recent battery change as well as pacemaker functionality check a week before traveling here.  Repeat pacemaker check planned by cardiology.  2.  Falls: Patient reports chronic tendency to fall due to buckling of her knees and relates to chronic arthritis for which she is walker dependent at baseline.  PT/OT evaluation.   -Continue to monitor orthostatics/support stockings.  3.  Nausea/vomiting: Likely related to diabetic gastroparesis.  Clear liquid diet and advance as tolerated.  Can try Reglan before meals if patient has severe symptoms.  4.  Abnormal EKG/elevated troponin: Could be in the setting of problem #3 and severe hypokalemia.  Echo pending.  Cardiac cath did not show any significant coronary disease as per cardiology. Defer further recommendations to cardiology.  Resume beta-blocker at a low dose with holding parameters.    5.  Severe hypokalemia: Present on admission.continue to monitor and replace as needed.  Repeat levels appears to have improved   6.  Mild leukocytosis: Likely reactive? secondary to problem #3.  However, UA showing evidence of UTI. -We will start her on IV ceftriaxone while awaiting urine cultures.  7.  Acute on  chronic kidney disease: Defer management to nephrology.  IV fluids, normal saline at 75/h per nephrology recommendations.  Hold ACE inhibitors.  Obtain baseline labs from [facility] in [place] where she was admitted recently.  8.  Diabetes mellitus, insulin-dependent: Patient reports taking 70/30 insulin 36 units in the morning and 30 units in the evening. Resumed at a lower dose given variable oral intake and problem #7  9.  Peripheral neuropathy: Resume gabapentin at a lower dose once blood pressure and dizziness improved.   DVT prophylaxis: IV heparin Code Status: Full Family Communication: No family at bedside. Disposition Plan: Home when stable  Procedures:   Cardiac cath  Antimicrobials:   Started Ceftriaxone (presumptive UTI)    Subjective: Patient complaining of some dizziness, generalized weakness otherwise no other neurologic symptoms.  Denies having any chest pain.  Objective: Vitals:   11/01/18 0815 11/01/18 0820 11/01/18 0832 11/01/18 0900  BP:   121/71 118/60  Pulse: 69 82 65 60  Resp: 16 (!) 21 16 16   Temp:      TempSrc:      SpO2: 97% 98% 95% 95%  Weight:      Height:        Intake/Output Summary (Last 24 hours) at 11/01/2018 1010 Last data filed at 11/01/2018 0800 Gross per 24 hour  Intake 2036.41 ml  Output 975 ml  Net 1061.41 ml   Filed Weights   10/31/18 1233 11/01/18 0500  Weight: 83.9 kg 87.3 kg    Examination:  General exam: Frail-appearing female, appears calm and comfortable  Respiratory system: Clear to auscultation. Respiratory effort normal. Cardiovascular system: S1 & S2 heard, RRR. No JVD, murmurs, rubs, gallops or clicks. No pedal edema. Gastrointestinal system: Abdomen is nondistended, soft and nontender. No organomegaly or masses felt. Normal bowel sounds heard. Central nervous system: Alert and oriented. No focal neurological deficits. Extremities: Symmetric 5 x 5 power. Skin: No rashes, lesions or ulcers Psychiatry: Judgement and insight appear normal. Mood & affect appropriate.     Data Reviewed: I have personally reviewed following labs and imaging studies  CBC: Recent Labs  Lab 10/31/18 1249  10/31/18 1255 10/31/18 1321 11/01/18 0516  WBC 11.5*  --   --  12.5*  NEUTROABS 7.9*  --   --   --   HGB 11.5* 10.5* 14.3 14.1  HCT 35.2* 31.0* 42.0 42.0  MCV 90.7  --   --  88.8  PLT 181  --   --  998   Basic Metabolic Panel: Recent Labs  Lab 10/31/18 1249 10/31/18 1255 10/31/18 1321 10/31/18 1500 11/01/18 0516  NA 139 141 135 132* 134*  K 2.2* 2.2* 3.3* 3.4* 3.4*  CL 114* 111 98 98 102  CO2 18*  --   --  22 20*  GLUCOSE 132* 128* 193* 207* 157*  BUN 38* 32* 48* 53* 45*  CREATININE 2.71* 2.50* 3.80* 3.64* 2.85*  CALCIUM 7.3*  --   --  11.1* 10.7*   GFR: Estimated Creatinine Clearance: 20.1 mL/min (A) (by C-G formula based on SCr of 2.85 mg/dL (H)). Liver Function Tests: Recent Labs  Lab 10/31/18 1249 10/31/18 1500  AST 27 163*  ALT 20 92*  ALKPHOS 85 137*  BILITOT 0.8 1.5*  PROT 4.1* 6.6  ALBUMIN 2.3* 3.7   No results for input(s): LIPASE, AMYLASE in the last 168 hours. No results for input(s): AMMONIA in the last 168 hours. Coagulation Profile: Recent Labs  Lab 10/31/18 1249  INR 1.31  Cardiac Enzymes: Recent Labs  Lab 10/31/18 1249 10/31/18 1500 10/31/18 2052 11/01/18 0516  TROPONINI 0.31* 0.46* 0.45* 0.42*   BNP (last 3 results) No results for input(s): PROBNP in the last 8760 hours. HbA1C: Recent Labs    10/31/18 1500  HGBA1C 7.0*   CBG: Recent Labs  Lab 10/31/18 1737 10/31/18 2129 11/01/18 0638  GLUCAP 192* 196* 171*   Lipid Profile: Recent Labs    10/31/18 1249 11/01/18 0516  CHOL 114 153  HDL 16* 24*  LDLCALC 64 94  TRIG 170* 175*  CHOLHDL 7.1 6.4   Thyroid Function Tests: No results for input(s): TSH, T4TOTAL, FREET4, T3FREE, THYROIDAB in the last 72 hours. Anemia Panel: No results for input(s): VITAMINB12, FOLATE, FERRITIN, TIBC, IRON, RETICCTPCT in the last 72 hours. Sepsis Labs: No results for input(s): PROCALCITON, LATICACIDVEN in the last 168 hours.  Recent Results (from the past 240 hour(s))  MRSA PCR  Screening     Status: None   Collection Time: 10/31/18  2:24 PM  Result Value Ref Range Status   MRSA by PCR NEGATIVE NEGATIVE Final    Comment:        The GeneXpert MRSA Assay (FDA approved for NASAL specimens only), is one component of a comprehensive MRSA colonization surveillance program. It is not intended to diagnose MRSA infection nor to guide or monitor treatment for MRSA infections. Performed at [facility], [place] [address], [place], [place] [address]          Radiology Studies: US Renal  Result Date: 11/01/2018 CLINICAL DATA:  Acute renal failure. EXAM: RENAL / URINARY TRACT ULTRASOUND COMPLETE COMPARISON:  None. FINDINGS: Right Kidney: Renal measurements: 11.6 x 7.6 x 7.1 cm = volume: 326 mL. Multiple cysts throughout the right kidney. No hydronephrosis. Left Kidney: Renal measurements: 13.3 x 6.5 x 5 cm = volume: 225 mL. Multiple cysts in the left kidney. No hydronephrosis. Bladder: No wall thickening or filling defects identified. IMPRESSION: Multiple bilateral renal cysts. 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 No hydronephrosis. Electronically Signed   By: Lucienne Capers M.D.   On: 11/01/2018 02:04        Scheduled Meds: . aspirin  81 mg Oral Daily  . Influenza vac split quadrivalent PF  0.5 mL Intramuscular Tomorrow-1000  . insulin aspart  0-20 Units Subcutaneous TID WC  . insulin aspart  0-5 Units Subcutaneous QHS  . insulin aspart protamine- aspart  12 Units Subcutaneous BID WC  . pneumococcal 23 valent vaccine  0.5 mL Intramuscular Tomorrow-1000  . potassium chloride  40 mEq Oral Once  . sodium chloride flush  3 mL Intravenous Q12H   Continuous Infusions: . sodium chloride    . heparin 1,150 Units/hr (11/01/18 0655)     LOS: 1 day    Time spent: 35 min    Yaakov Guthrie MD [facility] Pager [phone]  If 7PM-7AM, please contact night-coverage [email or web address] Password [email or web address] 11/01/2018, 10:10 AM

## 2018-11-01 NOTE — Progress Notes (Signed)
[place] for Heparin Indication: chest pain/ACS, rule out PE  Allergies  Allergen Reactions  . Percocet [Oxycodone-Acetaminophen]     Patient Measurements: Height: 5\' 3"  (160 cm) Weight: 192 lb 7.4 oz (87.3 kg) IBW/kg (Calculated) : 52.4 Heparin Dosing Weight: 84kg  Vital Signs: Temp: 98.9 F (37.2 C) (11/24 0300) Temp Source: Oral (11/24 0300) BP: 112/50 (11/24 0600) Pulse Rate: 80 (11/24 0600)  Labs: Recent Labs    10/31/18 1249 10/31/18 1255 10/31/18 1321 10/31/18 1500 10/31/18 2052 11/01/18 0516  HGB 11.5* 10.5* 14.3  --   --  14.1  HCT 35.2* 31.0* 42.0  --   --  42.0  PLT 181  --   --   --   --  175  APTT 29  --   --   --   --   --   LABPROT 16.1*  --   --   --   --   --   INR 1.31  --   --   --   --   --   HEPARINUNFRC <0.10*  --   --   --   --  0.14*  CREATININE 2.71* 2.50* 3.80* 3.64*  --   --   TROPONINI 0.31*  --   --  0.46* 0.45*  --     Estimated Creatinine Clearance: 15.7 mL/min (A) (by C-G formula based on SCr of 3.64 mg/dL (H)).   Medical History: Past Medical History:  Diagnosis Date  . CKD (chronic kidney disease) stage 3, GFR 30-59 ml/min (HCC) 10/31/2018   - per patient report  . Coronary artery disease    per patient report - 2 caths with non-obstructive CAD  . Essential hypertension   . H/O cardiac pacemaker 10/31/2018  . Type 2 diabetes mellitus with complication, with long-term current use of insulin ([place]) 10/31/2018   with diabetic nephropathy   Assessment: 67 year old female admitted with chest pain, syncope, and possible stemi. Taken urgently to lab. No cad was seen, possible occlusion of small branch but that would have been too small to stent. PE remains on differential and will start IV heparin and rule out.   11/24 AM update: heparin level low this AM, no issues per RN, CBC stable  Goal of Therapy:  Heparin level 0.3-0.7 units/ml Monitor platelets by anticoagulation protocol: Yes    Plan:  Inc heparin to 1150 units/hr Check anti-Xa level in 8 hours and daily while on heparin Continue to monitor H&H and platelets  Narda Bonds, PharmD, [place] Pharmacist Phone: [phone]

## 2018-11-01 NOTE — Evaluation (Signed)
Physical Therapy Evaluation Patient Details Name: Melissa Lopez MRN: [id number] DOB: [birth year] Today's Date: 11/01/2018   History of Present Illness  Pt is a 67 y.o. F with significant PMH of type 2 diabetes mellitus with diabetic neuropathy, chronic kidney disease, hypertension, CAD, bilateral knee replacement who presents as code STEMI after syncopal episode at home.   Clinical Impression  Pt admitted with above diagnosis. Pt currently with functional limitations due to the deficits listed below (see PT Problem List). Prior to admission, patient using Rollator for mobility and independent with ADL's. Reports history of 10 falls recently due to dizziness/syncopal episodes. Upon PT evaluation, patient presenting with impaired balance and mild dizziness. Orthostatics stable (see below). Ambulating 15 feet with walker and mild bilateral knee buckling and unsteadiness. Presents as a high fall risk based on deficits listed above and decreased gait speed; recommend HHPT and supervision for mobility at discharge. Pt will benefit from skilled PT to increase their independence and safety with mobility to allow discharge to the venue listed below.    Orthostatic Vital Signs: Supine: 89/54 Sitting: 84/61 Standing: 119/78    Follow Up Recommendations Home health PT;Supervision for mobility/OOB    Equipment Recommendations  None recommended by PT    Recommendations for Other Services       Precautions / Restrictions Precautions Precautions: Fall Precaution Comments: history of recurrent falls Restrictions Weight Bearing Restrictions: No      Mobility  Bed Mobility Overal bed mobility: Modified Independent                Transfers Overall transfer level: Needs assistance Equipment used: Rolling walker (2 wheeled) Transfers: Sit to/from Stand Sit to Stand: Min guard            Ambulation/Gait Ambulation/Gait assistance: Min guard Gait Distance (Feet): 15 Feet Assistive  device: Rolling walker (2 wheeled) Gait Pattern/deviations: Step-through pattern;Decreased stride length Gait velocity: decreased   General Gait Details: Patient with noted occasional mild bilateral knee buckling but no overt LOB. Complaint of dizziness but orthostatics stable   Stairs            Wheelchair Mobility    Modified Rankin (Stroke Patients Only)       Balance Overall balance assessment: Needs assistance Sitting-balance support: Feet supported Sitting balance-Leahy Scale: Good     Standing balance support: Bilateral upper extremity supported Standing balance-Leahy Scale: Poor                               Pertinent Vitals/Pain Pain Assessment: Faces Faces Pain Scale: No hurt    Home Living Family/patient expects to be discharged to:: Private residence Living Arrangements: Children(daughter) Available Help at Discharge: Family;Available 24 hours/day Type of Home: House Home Access: Stairs to enter   [employer] of Steps: 2 Home Layout: Able to live on main level with bedroom/bathroom Home Equipment: Gilford Rile - 4 wheels Additional Comments: lives in [place]    Prior Function Level of Independence: Independent with assistive device(s)         Comments: Uses Rollator. History of 10 falls recently     Hand Dominance        Extremity/Trunk Assessment   Upper Extremity Assessment Upper Extremity Assessment: Overall WFL for tasks assessed    Lower Extremity Assessment Lower Extremity Assessment: Overall WFL for tasks assessed       Communication   Communication: No difficulties  Cognition Arousal/Alertness: Awake/alert Behavior During Therapy: Flat affect  Overall Cognitive Status: No family/caregiver present to determine baseline cognitive functioning                                 General Comments: Patient somewhat drowsy initially (woke up from deep sleep) with decreased awareness. Does answer  questions appropriately and follow simple commands consistently      General Comments      Exercises     Assessment/Plan    PT Assessment Patient needs continued PT services  PT Problem List Decreased strength;Decreased activity tolerance;Decreased balance;Decreased mobility       PT Treatment Interventions DME instruction;Gait training;Stair training;Functional mobility training;Therapeutic activities;Therapeutic exercise;Balance training;Patient/family education    PT Goals (Current goals can be found in the Care Plan section)  Acute Rehab PT Goals Patient Stated Goal: none stated; agreeable to PT evaluation  PT Goal Formulation: With patient Time For Goal Achievement: 11/15/18 Potential to Achieve Goals: Good    Frequency Min 3X/week   Barriers to discharge        Co-evaluation               AM-PAC PT "6 Clicks" Mobility  Outcome Measure Help needed turning from your back to your side while in a flat bed without using bedrails?: None Help needed moving from lying on your back to sitting on the side of a flat bed without using bedrails?: None Help needed moving to and from a bed to a chair (including a wheelchair)?: A Little Help needed standing up from a chair using your arms (e.g., wheelchair or bedside chair)?: A Little Help needed to walk in hospital room?: A Little Help needed climbing 3-5 steps with a railing? : A Lot 6 Click Score: 19    End of Session   Activity Tolerance: Patient tolerated treatment well Patient left: in bed;with call bell/phone within reach Nurse Communication: Mobility status PT Visit Diagnosis: Unsteadiness on feet (R26.81);Difficulty in walking, not elsewhere classified (R26.2);Repeated falls (R29.6)    Time: 1100-1118 PT Time Calculation (min) (ACUTE ONLY): 18 min   Charges:   PT Evaluation $PT Eval Moderate Complexity: 1 Mod         Ellamae Sia, [place], DPT Acute Rehabilitation Services Pager [phone] Office  [phone]   Willy Eddy 11/01/2018, 1:43 PM

## 2018-11-02 ENCOUNTER — Encounter (HOSPITAL_COMMUNITY): Payer: Self-pay | Admitting: Cardiology

## 2018-11-02 DIAGNOSIS — R079 Chest pain, unspecified: Secondary | ICD-10-CM

## 2018-11-02 DIAGNOSIS — N179 Acute kidney failure, unspecified: Secondary | ICD-10-CM

## 2018-11-02 LAB — CORTISOL: CORTISOL PLASMA: 19.6 ug/dL

## 2018-11-02 LAB — BASIC METABOLIC PANEL
ANION GAP: 7 (ref 5–15)
BUN: 30 mg/dL — AB (ref 8–23)
CO2: 26 mmol/L (ref 22–32)
Calcium: 11.1 mg/dL — ABNORMAL HIGH (ref 8.9–10.3)
Chloride: 104 mmol/L (ref 98–111)
Creatinine, Ser: 2.1 mg/dL — ABNORMAL HIGH (ref 0.44–1.00)
GFR calc Af Amer: 27 mL/min — ABNORMAL LOW (ref >60)
GFR calc non Af Amer: 23 mL/min — ABNORMAL LOW (ref >60)
Glucose, Bld: 132 mg/dL — ABNORMAL HIGH (ref 70–99)
POTASSIUM: 3.2 mmol/L — AB (ref 3.5–5.1)
SODIUM: 137 mmol/L (ref 135–145)

## 2018-11-02 LAB — GLUCOSE, CAPILLARY
GLUCOSE-CAPILLARY: 135 mg/dL — AB (ref 70–99)
GLUCOSE-CAPILLARY: 120 mg/dL — AB (ref 70–99)
GLUCOSE-CAPILLARY: 143 mg/dL — AB (ref 70–99)
Glucose-Capillary: 136 mg/dL — ABNORMAL HIGH (ref 70–99)
Glucose-Capillary: 173 mg/dL — ABNORMAL HIGH (ref 70–99)

## 2018-11-02 LAB — CBC
HCT: 40.5 % (ref 36.0–46.0)
HEMOGLOBIN: 13.3 g/dL (ref 12.0–15.0)
MCH: 29.4 pg (ref 26.0–34.0)
MCHC: 32.8 g/dL (ref 30.0–36.0)
MCV: 89.6 fL (ref 80.0–100.0)
Platelets: 162 10*3/uL (ref 150–400)
RBC: 4.52 MIL/uL (ref 3.87–5.11)
RDW: 12.1 % (ref 11.5–15.5)
WBC: 6.9 10*3/uL (ref 4.0–10.5)
nRBC: 0 % (ref 0.0–0.2)

## 2018-11-02 LAB — HEPARIN LEVEL (UNFRACTIONATED): Heparin Unfractionated: <0.1 IU/mL — ABNORMAL LOW (ref 0.30–0.70)

## 2018-11-02 MED ORDER — BISACODYL 5 MG PO TBEC
10.0000 mg | DELAYED_RELEASE_TABLET | Freq: Once | ORAL | Status: AC
  Administered 2018-11-03: 10 mg via ORAL
  Filled 2018-11-02: qty 2

## 2018-11-02 MED ORDER — PANTOPRAZOLE SODIUM 40 MG PO TBEC
40.0000 mg | DELAYED_RELEASE_TABLET | Freq: Every day | ORAL | Status: DC
  Administered 2018-11-02 – 2018-11-03 (×2): 40 mg via ORAL
  Filled 2018-11-02 (×2): qty 1

## 2018-11-02 MED ORDER — METOCLOPRAMIDE HCL 5 MG PO TABS
5.0000 mg | ORAL_TABLET | Freq: Three times a day (TID) | ORAL | Status: DC
  Administered 2018-11-02 (×2): 5 mg via ORAL
  Filled 2018-11-02 (×2): qty 1

## 2018-11-02 MED ORDER — POTASSIUM CHLORIDE CRYS ER 20 MEQ PO TBCR
40.0000 meq | EXTENDED_RELEASE_TABLET | Freq: Once | ORAL | Status: AC
  Administered 2018-11-02: 40 meq via ORAL
  Filled 2018-11-02: qty 2

## 2018-11-02 MED FILL — Nitroglycerin IV Soln 100 MCG/ML in D5W: INTRA_ARTERIAL | Qty: 10 | Status: AC

## 2018-11-02 MED FILL — Verapamil HCl IV Soln 2.5 MG/ML: INTRAVENOUS | Qty: 2 | Status: AC

## 2018-11-02 NOTE — Progress Notes (Signed)
Progress Note  Patient Name: Melissa Lopez Date of Encounter: 11/02/2018  Primary Cardiologist:   No primary care provider on file.   Subjective   Nauseated and dizzy.  No chest pain  Inpatient Medications    Scheduled Meds: . aspirin  81 mg Oral Daily  . Influenza vac split quadrivalent PF  0.5 mL Intramuscular Tomorrow-1000  . insulin aspart  0-20 Units Subcutaneous TID WC  . insulin aspart  0-5 Units Subcutaneous QHS  . insulin aspart protamine- aspart  12 Units Subcutaneous BID WC  . pneumococcal 23 valent vaccine  0.5 mL Intramuscular Tomorrow-1000  . potassium chloride  40 mEq Oral Once  . sodium chloride flush  3 mL Intravenous Q12H   Continuous Infusions: . sodium chloride    . cefTRIAXone (ROCEPHIN)  IV 1 g (11/01/18 1259)   PRN Meds: sodium chloride, albuterol, morphine injection, ondansetron (ZOFRAN) IV, sodium chloride flush   Vital Signs    Vitals:   11/01/18 1400 11/01/18 1509 11/01/18 1954 11/02/18 0607  BP:  (!) 142/75 (!) 162/97 (!) 157/94  Pulse: 64  (!) 59 68  Resp: 14  11 (!) 22  Temp:  98.2 F (36.8 C) 98.5 F (36.9 C) 98.3 F (36.8 C)  TempSrc:  Oral Oral Oral  SpO2: 98% 98% 96%   Weight:    85.9 kg  Height:        Intake/Output Summary (Last 24 hours) at 11/02/2018 0741 Last data filed at 11/02/2018 0600 Gross per 24 hour  Intake 1320 ml  Output 900 ml  Net 420 ml   Filed Weights   10/31/18 1233 11/01/18 0500 11/02/18 0607  Weight: 83.9 kg 87.3 kg 85.9 kg    Telemetry    NSR, intermittent atrial pacing - Personally Reviewed  ECG    NA - Personally Reviewed  Physical Exam   GEN: No acute distress.  Chronically ill appearing Neck: No  JVD Cardiac: RRR, no murmurs, rubs, or gallops.  Respiratory: Clear  to auscultation bilaterally. GI: Soft, nontender, non-distended  MS: No  edema; No deformity. Neuro:  Nonfocal  Psych: Normal affect   Labs    Chemistry Recent Labs  Lab 10/31/18 1249  10/31/18 1500  11/01/18 0516 11/02/18 0333  NA 139   < > 132* 134* 137  K 2.2*   < > 3.4* 3.4* 3.2*  CL 114*   < > 98 102 104  CO2 18*  --  22 20* 26  GLUCOSE 132*   < > 207* 157* 132*  BUN 38*   < > 53* 45* 30*  CREATININE 2.71*   < > 3.64* 2.85* 2.10*  CALCIUM 7.3*  --  11.1* 10.7* 11.1*  PROT 4.1*  --  6.6  --   --   ALBUMIN 2.3*  --  3.7  --   --   AST 27  --  163*  --   --   ALT 20  --  92*  --   --   ALKPHOS 85  --  137*  --   --   BILITOT 0.8  --  1.5*  --   --   GFRNONAA 17*  --  12* 16* 23*  GFRAA 20*  --  14* 19* 27*  ANIONGAP 7  --  12 12 7    < > = values in this interval not displayed.     Hematology Recent Labs  Lab 10/31/18 1249  10/31/18 1321 11/01/18 0516 11/02/18 0333  WBC 11.5*  --   --  12.5* 6.9  RBC 3.88  --   --  4.73 4.52  HGB 11.5*   < > 14.3 14.1 13.3  HCT 35.2*   < > 42.0 42.0 40.5  MCV 90.7  --   --  88.8 89.6  MCH 29.6  --   --  29.8 29.4  MCHC 32.7  --   --  33.6 32.8  RDW 12.1  --   --  12.1 12.1  PLT 181  --   --  175 162   < > = values in this interval not displayed.    Cardiac Enzymes Recent Labs  Lab 10/31/18 1249 10/31/18 1500 10/31/18 2052 11/01/18 0516  TROPONINI 0.31* 0.46* 0.45* 0.42*    Recent Labs  Lab 10/31/18 1253  TROPIPOC 0.33*     BNPNo results for input(s): BNP, PROBNP in the last 168 hours.   DDimer  Recent Labs  Lab 10/31/18 1526  DDIMER 0.82*     Radiology    Dg Chest 2 View  Result Date: 11/01/2018 CLINICAL DATA:  Elevated D-dimer. EXAM: CHEST - 2 VIEW COMPARISON:  None. FINDINGS: UPPER limits normal heart size and LEFT-sided pacemaker noted. There is no evidence of focal airspace disease, pulmonary edema, suspicious pulmonary nodule/mass, pleural effusion, or pneumothorax. No acute bony abnormalities are identified. IMPRESSION: UPPER limits normal heart size without evidence of acute cardiopulmonary disease. Electronically Signed   By: Margarette Canada M.D.   On: 11/01/2018 13:23   US Renal  Result Date:  11/01/2018 CLINICAL DATA:  Acute renal failure. EXAM: RENAL / URINARY TRACT ULTRASOUND COMPLETE COMPARISON:  None. FINDINGS: Right Kidney: Renal measurements: 11.6 x 7.6 x 7.1 cm = volume: 326 mL. Multiple cysts throughout the right kidney. No hydronephrosis. Left Kidney: Renal measurements: 13.3 x 6.5 x 5 cm = volume: 225 mL. Multiple cysts in the left kidney. No hydronephrosis. Bladder: No wall thickening or filling defects identified. IMPRESSION: Multiple bilateral renal cysts. 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 No hydronephrosis. Electronically Signed   By: Lucienne Capers M.D.   On: 11/01/2018 02:04   Nm Pulmonary Perf And Vent  Result Date: 11/01/2018 CLINICAL DATA:  67 year old female with elevated D-dimer and syncope. EXAM: NUCLEAR MEDICINE VENTILATION - PERFUSION LUNG SCAN TECHNIQUE: Ventilation images were obtained in multiple projections using inhaled aerosol Tc-7m DTPA. Perfusion images were obtained in multiple projections after intravenous injection of Tc-71m MAA. RADIOPHARMACEUTICALS:  31.8 mCi of Tc-81m DTPA aerosol inhalation and 4.4 mCi Tc49m MAA IV COMPARISON:  11/01/2018 chest radiograph FINDINGS: Ventilation: No focal ventilation defect. Perfusion: No wedge shaped peripheral perfusion defects to suggest acute pulmonary embolism. IMPRESSION: Normal VQ scan. Electronically Signed   By: Margarette Canada M.D.   On: 11/01/2018 13:22    Cardiac Studies   ECHO:   Study Conclusions  - Left ventricle: The cavity size was normal. Wall thickness was   increased in a pattern of severe LVH. Systolic function was   normal. The estimated ejection fraction was in the range of 55%   to 60%. There is hypokinesis of the apical myocardium. Doppler   parameters are consistent with abnormal left ventricular   relaxation (grade 1 diastolic dysfunction). - Pericardium, extracardiac: A trivial pericardial effusion was   identified.  Impressions:  - Definity used; apical hypokinesis with overall  normal LV systolic   function; mild diastolic dysfunction; severe LVH more prominent   at apex; consider apical variant hypertrophic cardiomyopathy or   infiltrative cardiomyopathy.  CATH Diagnostic Diagram  Patient Profile     67 y.o. female with past medical history of pacemaker, diabetes mellitus, chronic stage III kidney disease, hypertension admitted with chest pain and syncope.  Cardiac catheterization revealed 70% ostial ramus relatively small in caliber, 60% ostial diagonal, and very small occluded first marginal.  There was no likely culprit for angina.  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   Assessment & Plan    ELEVATED TROPONIN:    Branch vessel disease as above.   Medical management.   CKD III:   Hydrated, holding ACE inhibitor.  Creat is improved.  Continue to follow.    DM:   Per IM.  Insulin was restarted at lower dose than home.    DIZZY:  Likely related to multiple comorbid illnesses with dehydration.  Not orthostatic this morning.  She ambulated with PT and home PT is recommended.  HTN:  Held antihypertensives yesterday.  I will resume low dose Norvasc.  BP is up.    PACEMAKER:  Normal function.  N/V:  Thought to be gastroparesis.    HYPOKALEMIA:  Supplement again today.   DISPOSITION:  The only think that is keeping her in the hospital is the inability to eat and the need for Zofran.  I would like to try to send her home tomorrow at the latest if internal medicine agrees.     For questions or updates, please contact [place] Please consult [email or web address] for contact info under Cardiology/STEMI.   Signed, Minus Breeding, MD  11/02/2018, 7:41 AM

## 2018-11-02 NOTE — Progress Notes (Signed)
[facility] Progress Note  Subjective: No c/o.  Neg orthostatics.  Renal function improved. Thnking about staying in area for prolonged if not permanent time.   Vitals:   11/01/18 1400 11/01/18 1509 11/01/18 1954 11/02/18 0607  BP:  (!) 142/75 (!) 162/97 (!) 157/94  Pulse: 64  (!) 59 68  Resp: 14  11 (!) 22  Temp:  98.2 F (36.8 C) 98.5 F (36.9 C) 98.3 F (36.8 C)  TempSrc:  Oral Oral Oral  SpO2: 98% 98% 96%   Weight:    85.9 kg  Height:        Inpatient medications: . aspirin  81 mg Oral Daily  . Influenza vac split quadrivalent PF  0.5 mL Intramuscular Tomorrow-1000  . insulin aspart  0-20 Units Subcutaneous TID WC  . insulin aspart  0-5 Units Subcutaneous QHS  . insulin aspart protamine- aspart  12 Units Subcutaneous BID WC  . metoCLOPramide  5 mg Oral TID AC  . pantoprazole  40 mg Oral Q0600  . pneumococcal 23 valent vaccine  0.5 mL Intramuscular Tomorrow-1000  . sodium chloride flush  3 mL Intravenous Q12H   . sodium chloride    . cefTRIAXone (ROCEPHIN)  IV 1 g (11/02/18 0956)   sodium chloride, albuterol, morphine injection, ondansetron (ZOFRAN) IV, sodium chloride flush  Iron/TIBC/Ferritin/ %Sat No results found for: IRON, TIBC, FERRITIN, IRONPCTSAT  Exam: Gen alert, obese, pleasant WF no distress No rash, cyanosis or gangrene Sclera anicteric, throat clear  No jvd or bruits Chest occ crackles at bases, no wheezing RRR no MRG Abd soft ntnd no mass or ascites +bs obese GU defer MS no joint effusions or deformity Ext no LE or UE edema, no wounds or ulcers, good pulses in feet Neuro is alert, Ox 3 , nf   Renal US > 12- 13 cm kidneys , no hydro, normal bladder  UA > 50 wbc, no rbc, 30 prot  UNa 29, UCr 176   Impression/ Plan: 1. Renal failure - uncertain baseline, steadily improving.  Prob transient worsening related to ACEi, LHC, volume status.  Renal US wnl, UA +wbc's only.  Pt is from out of town, here visiting family. Might move  here, hasn't decided.  Set up f/u appt with me at [place] in case stays. Will s/o for now, Call with any questions.  2. Volume - euvolemic on exam, stable 3. DM - takes 70/30 bid at home 4. HTN - on 4 antiHTN meds at home; resume slowly as needed 5. Chest pain / +trop - sp L heart cath 11/23, no sig CAD    [facility] pager [phone]   11/02/2018, 10:42 AM   Recent Labs  Lab 10/31/18 1249  10/31/18 1500 11/01/18 0516 11/02/18 0333  NA 139   < > 132* 134* 137  K 2.2*   < > 3.4* 3.4* 3.2*  CL 114*   < > 98 102 104  CO2 18*  --  22 20* 26  GLUCOSE 132*   < > 207* 157* 132*  BUN 38*   < > 53* 45* 30*  CREATININE 2.71*   < > 3.64* 2.85* 2.10*  CALCIUM 7.3*  --  11.1* 10.7* 11.1*  ALBUMIN 2.3*  --  3.7  --   --   INR 1.31  --   --   --   --    < > = values in this interval not displayed.   Recent Labs  Lab 10/31/18 1249 10/31/18  1500  AST 27 163*  ALT 20 92*  ALKPHOS 85 137*  BILITOT 0.8 1.5*  PROT 4.1* 6.6   Recent Labs  Lab 10/31/18 1249  11/01/18 0516 11/02/18 0333  WBC 11.5*  --  12.5* 6.9  NEUTROABS 7.9*  --   --   --   HGB 11.5*   < > 14.1 13.3  HCT 35.2*   < > 42.0 40.5  MCV 90.7  --  88.8 89.6  PLT 181  --  175 162   < > = values in this interval not displayed.

## 2018-11-02 NOTE — Progress Notes (Signed)
Physical Therapy Treatment Patient Details Name: Melissa Lopez MRN: [id number] DOB: [birth year] Today's Date: 11/02/2018    History of Present Illness Pt is a 67 y.o. F with significant PMH of type 2 diabetes mellitus with diabetic neuropathy, chronic kidney disease, hypertension, CAD, bilateral knee replacement who presents as code STEMI after syncopal episode at home.     PT Comments    Pt admitted with above diagnosis. Pt currently with functional limitations due to balance and endurance deficits. Pt was able to ambulate with RW to hallway and back to bed with min guard assist.  No c/o dizziness.  No gait instability.  Noted after treatment complete that MD has ordered vestibular eval.  Will return in am to complete vestibular eval.  Pt will benefit from skilled PT to increase their independence and safety with mobility to allow discharge to the venue listed below.     Follow Up Recommendations  Home health PT;Supervision for mobility/OOB     Equipment Recommendations  None recommended by PT    Recommendations for Other Services       Precautions / Restrictions Precautions Precautions: Fall Precaution Comments: history of recurrent falls Restrictions Weight Bearing Restrictions: No    Mobility  Bed Mobility Overal bed mobility: Modified Independent                Transfers Overall transfer level: Needs assistance Equipment used: Rolling walker (2 wheeled) Transfers: Sit to/from Stand Sit to Stand: Min guard         General transfer comment: cues for hand placement  Ambulation/Gait Ambulation/Gait assistance: Min guard Gait Distance (Feet): 55 Feet Assistive device: Rolling walker (2 wheeled) Gait Pattern/deviations: Step-through pattern;Decreased stride length Gait velocity: decreased Gait velocity interpretation: <1.31 ft/sec, indicative of household ambulator General Gait Details: did not note knee buckling today and  no overt LOB. No complaints of  dizziness today with ambulation.   Pt self limiting as she walked to hallway and then back to bed.  Tried to encourage pt to walk again and pt declined politely.    Stairs             Wheelchair Mobility    Modified Rankin (Stroke Patients Only)       Balance Overall balance assessment: Needs assistance Sitting-balance support: Feet supported Sitting balance-Leahy Scale: Good     Standing balance support: Bilateral upper extremity supported Standing balance-Leahy Scale: Poor Standing balance comment: relies on RW for support                            Cognition Arousal/Alertness: Awake/alert Behavior During Therapy: Flat affect Overall Cognitive Status: No family/caregiver present to determine baseline cognitive functioning                                 General Comments: Patient somewhat drowsy initially (woke up from deep sleep) with decreased awareness. Does answer questions appropriately and follow simple commands consistently      Exercises General Exercises - Lower Extremity Ankle Circles/Pumps: AROM;Both;10 reps;Supine Long Arc Quad: AROM;Both;10 reps;Seated Hip Flexion/Marching: AROM;Both;10 reps;Seated    General Comments        Pertinent Vitals/Pain Faces Pain Scale: No hurt    Home Living                      Prior Function  PT Goals (current goals can now be found in the care plan section) Acute Rehab PT Goals Patient Stated Goal: none stated; agreeable to PT evaluation  Progress towards PT goals: Progressing toward goals    Frequency    Min 3X/week      PT Plan Current plan remains appropriate    Co-evaluation              AM-PAC PT "6 Clicks" Mobility   Outcome Measure  Help needed turning from your back to your side while in a flat bed without using bedrails?: None Help needed moving from lying on your back to sitting on the side of a flat bed without using bedrails?:  None Help needed moving to and from a bed to a chair (including a wheelchair)?: A Little Help needed standing up from a chair using your arms (e.g., wheelchair or bedside chair)?: A Little Help needed to walk in hospital room?: A Little Help needed climbing 3-5 steps with a railing? : A Lot 6 Click Score: 19    End of Session Equipment Utilized During Treatment: Gait belt Activity Tolerance: Patient tolerated treatment well Patient left: in bed;with call bell/phone within reach;with bed alarm set Nurse Communication: Mobility status PT Visit Diagnosis: Unsteadiness on feet (R26.81);Difficulty in walking, not elsewhere classified (R26.2);Repeated falls (R29.6)     Time: 6553-7482 PT Time Calculation (min) (ACUTE ONLY): 12 min  Charges:  $Gait Training: 8-22 mins                     [place] Pager:  [phone]  Office:  [place] 11/02/2018, 4:32 PM

## 2018-11-02 NOTE — Progress Notes (Signed)
[facility] consult progress note                                                                               Patient Demographics  Melissa Lopez, is a 67 y.o. female, DOB - Sep 05, 1951, Melissa Lopez  Admit date - 10/31/2018   Admitting Physician Melissa Man, MD  Outpatient Primary MD for the patient is No primary care provider on file.  Outpatient specialists:   LOS - 2  days   Medical records reviewed and are as summarized below:    No chief complaint on file.      Brief summary   Melissa Honeycuttis an 67 y.o.femalewith history of type 2 diabetes mellitus with diabetic neuropathy, chronic kidney disease? Stage II, hypertension, CAD, arthritis requiring bilateral knee replacement presented as code STEMI after a near syncopal event at home. Patient admitted to cardiology service and underwent emergent cardiac cath and concern for EKG changes and abnormal troponin. Cardiac cath was unremarkable and did not explain her symptoms. Medicine service consulted for further evaluation. Patient has had dizzy spells, described as lightheaded feeling, for a long time and mostly on walking. She has been walker dependent at baseline due to bilateral arthritis and ataxia/periodic dizziness. The last episode was also associated with some chest discomfort. 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 She takes PPI at baseline. She has chronic exertional dyspnea with no new changes.Labs on presentation showed potassium of 2.2, creatinine of 2.7 (repeat level at 3.8), troponin of 0.33 and EKG showedwide-complex paced rhythm with ST changes.   Assessment & Plan    Principal Problem:   Chest pain with high risk of acute coronary syndrome, elevated troponin, pacemaker -Per primary service, cardiology  Active Problems:    Syncope/near syncope,  falls -Orthostatic negative, VQ scan negative cath unremarkable, may have underlying autonomic dysfunction due to uncontrolled diabetes -Will obtain random cortisol level, if low, obtain stim test.  PT evaluation  Nausea and vomiting -Likely due to underlying diabetic gastroparesis.  Per patient, she has nausea in the mornings chronically -Placed on Reglan trial before meals, PPI, outpatient follow-up with PCP or GI  Type 2 diabetes mellitus, IDDM  -CBGs fairly stable, continue NovoLog 70/30 20 units twice a day  Mild leukocytosis, UTI -Urine culture has been ordered but not done, currently on IV Rocephin, day #2 today -will continue 1 more day, does not need any further antibiotics   Acute on CKD (chronic kidney disease) stage 3, GFR 30-59 ml/min (HCC) - per patient report -Creatinine improving, outpatient follow-up with Melissa Lopez    Essential hypertension BP stable  Code Status: Full CODE STATUS DVT Prophylaxis:   Family Communication: Discussed in detail with the patient, all imaging results, lab results explained to the patient    Disposition Plan: If tolerates diet, ambulating the hallway, may DC patient home tomorrow a.m after third dose of IV Rocephin  Time Spent in minutes   25 minutes Procedures:  Cardiac cath  Antimicrobials:      Medications  Scheduled Meds: . aspirin  81 mg Oral  Daily  . Influenza vac split quadrivalent PF  0.5 mL Intramuscular Tomorrow-1000  . insulin aspart  0-20 Units Subcutaneous TID WC  . insulin aspart  0-5 Units Subcutaneous QHS  . insulin aspart protamine- aspart  12 Units Subcutaneous BID WC  . metoCLOPramide  5 mg Oral TID AC  . pantoprazole  40 mg Oral Q0600  . pneumococcal 23 valent vaccine  0.5 mL Intramuscular Tomorrow-1000  . sodium chloride flush  3 mL Intravenous Q12H   Continuous Infusions: . sodium chloride    . cefTRIAXone (ROCEPHIN)  IV 1 g (11/02/18 0956)   PRN Meds:.sodium chloride, albuterol, morphine  injection, ondansetron (ZOFRAN) IV, sodium chloride flush   Antibiotics   Anti-infectives (From admission, onward)   Start     Dose/Rate Route Frequency Ordered Stop   11/01/18 1100  cefTRIAXone (ROCEPHIN) 1 g in sodium chloride 0.9 % 100 mL IVPB     1 g 200 mL/hr over 30 Minutes Intravenous Daily 11/01/18 1025          Subjective:   Melissa Lopez was seen and examined today.  No complaints except nausea, no fevers or chills, any diarrhea. Patient denies dizziness, chest pain, shortness of breath,  new weakness, numbess, tingling.   Objective:   Vitals:   11/01/18 1400 11/01/18 1509 11/01/18 1954 11/02/18 0607  BP:  (!) 142/75 (!) 162/97 (!) 157/94  Pulse: 64  (!) 59 68  Resp: 14  11 (!) 22  Temp:  98.2 F (36.8 C) 98.5 F (36.9 C) 98.3 F (36.8 C)  TempSrc:  Oral Oral Oral  SpO2: 98% 98% 96%   Weight:    85.9 kg  Height:        Intake/Output Summary (Last 24 hours) at 11/02/2018 1136 Last data filed at 11/02/2018 0600 Gross per 24 hour  Intake 880 ml  Output 550 ml  Net 330 ml     Wt Readings from Last 3 Encounters:  11/02/18 85.9 kg     Exam  General: Alert and oriented x 3, NAD  Eyes:   HEENT:   Cardiovascular: S1 S2 auscultated, . Regular rate and rhythm.  Respiratory: Clear to auscultation bilaterally, no wheezing, rales or rhonchi  Gastrointestinal: Soft, nontender, nondistended, + bowel sounds  Ext: no pedal edema bilaterally  Neuro: no new deficits  Musculoskeletal: No digital cyanosis, clubbing  Skin: No rashes  Psych: Normal affect and demeanor, alert and oriented x3    Data Reviewed:  I have personally reviewed following labs and imaging studies  Micro Results Recent Results (from the past 240 hour(s))  MRSA PCR Screening     Status: None   Collection Time: 10/31/18  2:24 PM  Result Value Ref Range Status   MRSA by PCR NEGATIVE NEGATIVE Final    Comment:        The GeneXpert MRSA Assay (FDA approved for NASAL  specimens only), is one component of a comprehensive MRSA colonization surveillance program. It is not intended to diagnose MRSA infection nor to guide or monitor treatment for MRSA infections. Performed at [facility], [place] [address] [place], [place] [address]     Radiology Reports Dg Chest 2 View  Result Date: 11/01/2018 CLINICAL DATA:  Elevated D-dimer. EXAM: CHEST - 2 VIEW COMPARISON:  None. FINDINGS: UPPER limits normal heart size and LEFT-sided pacemaker noted. There is no evidence of focal airspace disease, pulmonary edema, suspicious pulmonary nodule/mass, pleural effusion, or pneumothorax. No acute bony abnormalities are identified. IMPRESSION: UPPER limits normal  heart size without evidence of acute cardiopulmonary disease. Electronically Signed   By: Margarette Canada M.D.   On: 11/01/2018 13:23   US Renal  Result Date: 11/01/2018 CLINICAL DATA:  Acute renal failure. EXAM: RENAL / URINARY TRACT ULTRASOUND COMPLETE COMPARISON:  None. FINDINGS: Right Kidney: Renal measurements: 11.6 x 7.6 x 7.1 cm = volume: 326 mL. Multiple cysts throughout the right kidney. No hydronephrosis. Left Kidney: Renal measurements: 13.3 x 6.5 x 5 cm = volume: 225 mL. Multiple cysts in the left kidney. No hydronephrosis. Bladder: No wall thickening or filling defects identified. IMPRESSION: Multiple bilateral renal cysts. 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 No hydronephrosis. Electronically Signed   By: Lucienne Capers M.D.   On: 11/01/2018 02:04   Nm Pulmonary Perf And Vent  Result Date: 11/01/2018 CLINICAL DATA:  67 year old female with elevated D-dimer and syncope. EXAM: NUCLEAR MEDICINE VENTILATION - PERFUSION LUNG SCAN TECHNIQUE: Ventilation images were obtained in multiple projections using inhaled aerosol Tc-64m DTPA. Perfusion images were obtained in multiple projections after intravenous injection of Tc-64m MAA. RADIOPHARMACEUTICALS:  31.8 mCi of Tc-58m DTPA aerosol inhalation and 4.4 mCi Tc77m  MAA IV COMPARISON:  11/01/2018 chest radiograph FINDINGS: Ventilation: No focal ventilation defect. Perfusion: No wedge shaped peripheral perfusion defects to suggest acute pulmonary embolism. IMPRESSION: Normal VQ scan. Electronically Signed   By: Margarette Canada M.D.   On: 11/01/2018 13:22    Lab Data:  CBC: Recent Labs  Lab 10/31/18 1249 10/31/18 1255 10/31/18 1321 11/01/18 0516 11/02/18 0333  WBC 11.5*  --   --  12.5* 6.9  NEUTROABS 7.9*  --   --   --   --   HGB 11.5* 10.5* 14.3 14.1 13.3  HCT 35.2* 31.0* 42.0 42.0 40.5  MCV 90.7  --   --  88.8 89.6  PLT 181  --   --  175 413   Basic Metabolic Panel: Recent Labs  Lab 10/31/18 1249 10/31/18 1255 10/31/18 1321 10/31/18 1500 11/01/18 0516 11/02/18 0333  NA 139 141 135 132* 134* 137  K 2.2* 2.2* 3.3* 3.4* 3.4* 3.2*  CL 114* 111 98 98 102 104  CO2 18*  --   --  22 20* 26  GLUCOSE 132* 128* 193* 207* 157* 132*  BUN 38* 32* 48* 53* 45* 30*  CREATININE 2.71* 2.50* 3.80* 3.64* 2.85* 2.10*  CALCIUM 7.3*  --   --  11.1* 10.7* 11.1*   GFR: Estimated Creatinine Clearance: 27 mL/min (A) (by C-G formula based on SCr of 2.1 mg/dL (H)). Liver Function Tests: Recent Labs  Lab 10/31/18 1249 10/31/18 1500  AST 27 163*  ALT 20 92*  ALKPHOS 85 137*  BILITOT 0.8 1.5*  PROT 4.1* 6.6  ALBUMIN 2.3* 3.7   No results for input(s): LIPASE, AMYLASE in the last 168 hours. No results for input(s): AMMONIA in the last 168 hours. Coagulation Profile: Recent Labs  Lab 10/31/18 1249  INR 1.31   Cardiac Enzymes: Recent Labs  Lab 10/31/18 1249 10/31/18 1500 10/31/18 2052 11/01/18 0516  TROPONINI 0.31* 0.46* 0.45* 0.42*   BNP (last 3 results) No results for input(s): PROBNP in the last 8760 hours. HbA1C: Recent Labs    10/31/18 1500  HGBA1C 7.0*   CBG: Recent Labs  Lab 11/01/18 1612 11/01/18 2138 11/02/18 0552 11/02/18 0724 11/02/18 1118  GLUCAP 178* 116* 135* 136* 120*   Lipid Profile: Recent Labs    10/31/18 1249  11/01/18 0516  CHOL 114 153  HDL 16* 24*  LDLCALC 64 94  TRIG 170* 175*  CHOLHDL 7.1 6.4   Thyroid Function Tests: No results for input(s): TSH, T4TOTAL, FREET4, T3FREE, THYROIDAB in the last 72 hours. Anemia Panel: No results for input(s): VITAMINB12, FOLATE, FERRITIN, TIBC, IRON, RETICCTPCT in the last 72 hours. Urine analysis:    Component Value Date/Time   COLORURINE YELLOW 10/31/2018 1751   APPEARANCEUR CLOUDY (A) 10/31/2018 1751   LABSPEC 1.026 10/31/2018 1751   PHURINE 5.0 10/31/2018 1751   GLUCOSEU NEGATIVE 10/31/2018 1751   HGBUR SMALL (A) 10/31/2018 1751   BILIRUBINUR NEGATIVE 10/31/2018 1751   KETONESUR NEGATIVE 10/31/2018 1751   PROTEINUR 30 (A) 10/31/2018 1751   NITRITE NEGATIVE 10/31/2018 1751   LEUKOCYTESUR LARGE (A) 10/31/2018 1751     Ripudeep Rai M.D. [facility] 11/02/2018, 11:36 AM  Pager: [phone] Between 7am to 7pm - call Pager - [phone]  After 7pm go to [email or web address] - password [email or web address]  Call night coverage person covering after 7pm

## 2018-11-02 NOTE — Care Management Note (Addendum)
Case Management Note  Patient Details  Name: Melissa Lopez MRN: [id number] Date of Birth: [birth year]  Subjective/Objective:  Pt presented for Chest Pain. PTA from [place]- patient visiting granddaughter. Pt states she may spend the winter in [place]. Patient has PCP in [place]. PT recommendations for [facility]. ([facility] Needed for home). Pt has Medicare and Medicaid.                Action/Plan: CM did speak with patient in regards to [place]. Pt is agreeable to [facility] and feels she needs [facility] RN for disease management and HH PT. Pt will need orders and F2F. Referral was made to [facility] with [facility] and SOC to begin within 24-48 hours post transition home.  No further needs from CM at this time.   Expected Discharge Date:                  Expected Discharge Plan:  [place]  In-House Referral:  NA  Discharge planning Services  CM Consult  Post Acute Care Choice:  Home Health Choice offered to:  Patient  DME Arranged:  N/A DME Agency:  NA  HH Arranged:  RN, Disease Management, PT [place] Agency:  [place]  Status of Service:  Completed, signed off  If discussed at [place] of Stay Meetings, dates discussed:    Additional Comments: 1512 11-11-18 Jacqlyn Krauss, RN,BSN [phone] Advancing diet from [place] as tolerated. Pt has [place] [facility] set up once stable to transition home.  Bethena Roys, RN 11/02/2018, 12:01 PM

## 2018-11-03 ENCOUNTER — Inpatient Hospital Stay (HOSPITAL_COMMUNITY): Payer: Medicare Other

## 2018-11-03 LAB — URINE CULTURE: CULTURE: NO GROWTH

## 2018-11-03 LAB — CBC
HCT: 42.4 % (ref 36.0–46.0)
Hemoglobin: 14.1 g/dL (ref 12.0–15.0)
MCH: 29.2 pg (ref 26.0–34.0)
MCHC: 33.3 g/dL (ref 30.0–36.0)
MCV: 87.8 fL (ref 80.0–100.0)
NRBC: 0 % (ref 0.0–0.2)
PLATELETS: 177 10*3/uL (ref 150–400)
RBC: 4.83 MIL/uL (ref 3.87–5.11)
RDW: 11.9 % (ref 11.5–15.5)
WBC: 7.2 10*3/uL (ref 4.0–10.5)

## 2018-11-03 LAB — BASIC METABOLIC PANEL
Anion gap: 9 (ref 5–15)
BUN: 20 mg/dL (ref 8–23)
CALCIUM: 11.7 mg/dL — AB (ref 8.9–10.3)
CO2: 23 mmol/L (ref 22–32)
Chloride: 103 mmol/L (ref 98–111)
Creatinine, Ser: 1.97 mg/dL — ABNORMAL HIGH (ref 0.44–1.00)
GFR calc Af Amer: 29 mL/min — ABNORMAL LOW (ref >60)
GFR, EST NON AFRICAN AMERICAN: 25 mL/min — AB (ref >60)
GLUCOSE: 157 mg/dL — AB (ref 70–99)
Potassium: 3.4 mmol/L — ABNORMAL LOW (ref 3.5–5.1)
SODIUM: 135 mmol/L (ref 135–145)

## 2018-11-03 LAB — GLUCOSE, CAPILLARY
GLUCOSE-CAPILLARY: 159 mg/dL — AB (ref 70–99)
GLUCOSE-CAPILLARY: 171 mg/dL — AB (ref 70–99)
Glucose-Capillary: 109 mg/dL — ABNORMAL HIGH (ref 70–99)
Glucose-Capillary: 132 mg/dL — ABNORMAL HIGH (ref 70–99)

## 2018-11-03 MED ORDER — BISACODYL 10 MG RE SUPP: 10.0000 mg | Freq: Every day | RECTAL | Status: DC | PRN

## 2018-11-03 MED ORDER — POTASSIUM CHLORIDE CRYS ER 20 MEQ PO TBCR
40.0000 meq | EXTENDED_RELEASE_TABLET | Freq: Once | ORAL | Status: AC
  Administered 2018-11-03: 40 meq via ORAL
  Filled 2018-11-03 (×2): qty 2

## 2018-11-03 MED ORDER — AMLODIPINE BESYLATE 5 MG PO TABS
5.0000 mg | ORAL_TABLET | Freq: Every day | ORAL | Status: DC
  Administered 2018-11-03 – 2018-11-04 (×2): 5 mg via ORAL
  Filled 2018-11-03 (×3): qty 1

## 2018-11-03 MED ORDER — SODIUM CHLORIDE 0.9 % IV SOLN
INTRAVENOUS | Status: DC
  Administered 2018-11-03 – 2018-11-04 (×3): via INTRAVENOUS

## 2018-11-03 MED ORDER — METOCLOPRAMIDE HCL 5 MG/ML IJ SOLN
5.0000 mg | Freq: Four times a day (QID) | INTRAMUSCULAR | Status: DC
  Administered 2018-11-03 – 2018-11-07 (×17): 5 mg via INTRAVENOUS
  Filled 2018-11-03 (×17): qty 2

## 2018-11-03 MED ORDER — INFLUENZA VAC SPLIT HIGH-DOSE 0.5 ML IM SUSY
0.5000 mL | PREFILLED_SYRINGE | INTRAMUSCULAR | Status: AC
  Administered 2018-11-06: 0.5 mL via INTRAMUSCULAR
  Filled 2018-11-03: qty 0.5

## 2018-11-03 MED ORDER — TECHNETIUM TC 99M SULFUR COLLOID
2.0000 | Freq: Once | INTRAVENOUS | Status: AC | PRN
  Administered 2018-11-03: 2 via ORAL

## 2018-11-03 MED ORDER — PANTOPRAZOLE SODIUM 40 MG IV SOLR
40.0000 mg | INTRAVENOUS | Status: DC
  Administered 2018-11-03 – 2018-11-07 (×5): 40 mg via INTRAVENOUS
  Filled 2018-11-03 (×5): qty 40

## 2018-11-03 MED ORDER — BISACODYL 10 MG RE SUPP
10.0000 mg | Freq: Once | RECTAL | Status: AC
  Administered 2018-11-03: 10 mg via RECTAL
  Filled 2018-11-03: qty 1

## 2018-11-03 MED ORDER — SORBITOL 70 % SOLN
960.0000 mL | TOPICAL_OIL | Freq: Once | ORAL | Status: AC
  Administered 2018-11-03: 960 mL via RECTAL
  Filled 2018-11-03: qty 473

## 2018-11-03 NOTE — Progress Notes (Signed)
Pt's daughter stated pt was resting in bed and started to vigorsly shake. Pt was able to talk while she was shaking and asked her daughter "why am I shaking". Pt and daughter stated the shaking lasted less then one minute. Pt denied any other symptoms. V/S stable. Pt alert and oriented no nuero deficits noted. Pt currently playing game on tablet.  NP on call made aware. On coming RN will cont to observe pt.

## 2018-11-03 NOTE — Progress Notes (Signed)
[facility] progress note                                                                               Patient Demographics  Melissa Lopez, is a 67 y.o. female, DOB - July 08, 1951, YBW:[id number]  Admit date - 10/31/2018   Admitting Physician Leonie Man, MD  Outpatient Primary MD for the patient is No primary care provider on file.  Outpatient specialists:   LOS - 3  days   Medical records reviewed and are as summarized below:    No chief complaint on file.      Brief summary   Melissa Honeycuttis an 66 y.o.femalewith history of type 2 diabetes mellitus with diabetic neuropathy, chronic kidney disease? Stage II, hypertension, CAD, arthritis requiring bilateral knee replacement presented as code STEMI after a near syncopal event at home. Patient admitted to cardiology service and underwent emergent cardiac cath and concern for EKG changes and abnormal troponin. Cardiac cath was unremarkable and did not explain her symptoms. Medicine service consulted for further evaluation. Patient has had dizzy spells, described as lightheaded feeling, for a long time and mostly on walking. She has been walker dependent at baseline due to bilateral arthritis and ataxia/periodic dizziness. The last episode was also associated with some chest discomfort. 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 She takes PPI at baseline. She has chronic exertional dyspnea with no new changes.Labs on presentation showed potassium of 2.2, creatinine of 2.7 (repeat level at 3.8), troponin of 0.33 and EKG showedwide-complex paced rhythm with ST changes.   Assessment & Plan    Principal Problem:   Chest pain with high risk of acute coronary syndrome, elevated troponin, pacemaker -Per cardiology, branch vessel disease, continue current therapy  Active Problems:    Syncope/near  syncope, falls -Orthostatic negative, VQ scan negative.  Cardiac cath unremarkable, may have underlying autonomic dysfunction due to uncontrolled diabetes -Cortisol level normal.  - PT for vestibular evaluation positive for right-sided BPPV   Nausea and vomiting -Unclear etiology possibly due to diabetic gastroparesis, gastritis/esophagitis -Patient was started on oral Reglan 3 times daily before meals however she has been vomiting today, changed to IV Reglan scheduled, changed to IV PPI -Daughter at the bedside stated that patient did have endoscopy 5-6 years ago at [place], but does not remember the results.  -Will not be able to tolerate esophagogram or upper GI series, GI consulted, may need EGD for further evaluation -Diet change to clear liquid diet, placed on gentle hydration  Type 2 diabetes mellitus, IDDM  -Continue current insulin regimen NovoLog 70/30 20 units twice a day  Mild leukocytosis, UTI -Urine culture has been ordered but not done, currently on IV Rocephin, day #3 today - Follow urine culture and sensitivities  Acute on CKD (chronic kidney disease) stage 3, GFR 30-59 ml/min (HCC) - per patient report -Creatinine improving, outpatient follow-up with Dr. Joelyn Oms -Placed on gentle hydration    Essential hypertension BP stable  Code Status: Full CODE STATUS DVT Prophylaxis:   Family Communication: Discussed in detail with the patient, all  imaging results, lab results explained to the patient bedside   Disposition Plan: DC home when nausea vomiting improved and tolerating solid diet  Time Spent in minutes   25 minutes  Procedures:  Cardiac cath  LV end diastolic pressure is mildly elevated.  Ost Ramus lesion is 70% stenosed -relatively small caliber vessel. Not likely because of resting chest pain and ST elevations.  Ost 1st Diag lesion is 60% stenosed. -Not likely flow-limiting.  Very small caliber (<59mm) 1st Mrg lesion appears to be 100% stenosed with  faint collateralization. Too small for angioplasty or stenting.   SUMMARY:  Angiographically mild diffuse disease, only potentially occluded vessels are very very small branch of the major obtuse marginal.  Not likely a culprit lesion for significant anginal chest pain. Mild to moderately elevated LVEDP.  LV gram not performed due to renal insufficiency.   2D echo Study Conclusions  - Left ventricle: The cavity size was normal. Wall thickness was   increased in a pattern of severe LVH. Systolic function was   normal. The estimated ejection fraction was in the range of 55%   to 60%. There is hypokinesis of the apical myocardium. Doppler   parameters are consistent with abnormal left ventricular   relaxation (grade 1 diastolic dysfunction). - Pericardium, extracardiac: A trivial pericardial effusion was   identified.  Antimicrobials:   Rocephin   Medications  Scheduled Meds: . amLODipine  5 mg Oral Daily  . aspirin  81 mg Oral Daily  . Influenza vac split quadrivalent PF  0.5 mL Intramuscular Tomorrow-1000  . insulin aspart  0-20 Units Subcutaneous TID WC  . insulin aspart  0-5 Units Subcutaneous QHS  . insulin aspart protamine- aspart  12 Units Subcutaneous BID WC  . metoCLOPramide (REGLAN) injection  5 mg Intravenous Q6H  . pantoprazole (PROTONIX) IV  40 mg Intravenous Q24H  . pneumococcal 23 valent vaccine  0.5 mL Intramuscular Tomorrow-1000  . potassium chloride  40 mEq Oral Once  . sodium chloride flush  3 mL Intravenous Q12H   Continuous Infusions: . sodium chloride    . sodium chloride 75 mL/hr at 11/03/18 1052  . cefTRIAXone (ROCEPHIN)  IV 1 g (11/03/18 1056)   PRN Meds:.sodium chloride, albuterol, bisacodyl, morphine injection, ondansetron (ZOFRAN) IV, sodium chloride flush   Antibiotics   Anti-infectives (From admission, onward)   Start     Dose/Rate Route Frequency Ordered Stop   11/01/18 1100  cefTRIAXone (ROCEPHIN) 1 g in sodium chloride 0.9 % 100 mL  IVPB     1 g 200 mL/hr over 30 Minutes Intravenous Daily 11/01/18 1025          Subjective:   Melissa Lopez was seen and examined today.  Feeling miserable, per daughter at the bedside, patient has been throwing up this morning.  No fevers or chills.  No chest pain.   Objective:   Vitals:   11/02/18 1327 11/02/18 2028 11/03/18 0539 11/03/18 0824  BP: (!) 167/92 (!) 157/87 (!) 176/81 (!) 170/97  Pulse: (!) 59 (!) 59 61 64  Resp: 18 19 18    Temp: 98 F (36.7 C) 98.2 F (36.8 C) 98 F (36.7 C)   TempSrc: Oral Oral Oral   SpO2: 97% 93% 96%   Weight:   86.1 kg   Height:        Intake/Output Summary (Last 24 hours) at 11/03/2018 1257 Last data filed at 11/03/2018 0000 Gross per 24 hour  Intake 120 ml  Output 1150 ml  Net -1030  ml     Wt Readings from Last 3 Encounters:  11/03/18 86.1 kg    Physical Exam  General: Alert and oriented x 3, looks uncomfortable, ill-appearing  Eyes:   HEENT:    Cardiovascular: S1 S2 auscultated, Regular rate and rhythm. No pedal edema b/l  Respiratory: Clear to auscultation bilaterally, no wheezing, rales or rhonchi  Gastrointestinal: Soft, nontender, nondistended, + bowel sounds  Ext: no pedal edema bilaterally  Neuro: no new deficits  Musculoskeletal: No digital cyanosis, clubbing  Skin: No rashes  Psych: flat affect    Data Reviewed:  I have personally reviewed following labs and imaging studies  Micro Results Recent Results (from the past 240 hour(s))  MRSA PCR Screening     Status: None   Collection Time: 10/31/18  2:24 PM  Result Value Ref Range Status   MRSA by PCR NEGATIVE NEGATIVE Final    Comment:        The GeneXpert MRSA Assay (FDA approved for NASAL specimens only), is one component of a comprehensive MRSA colonization surveillance program. It is not intended to diagnose MRSA infection nor to guide or monitor treatment for MRSA infections. Performed at [facility], [place] [address]  [place], [place] [address]     Radiology Reports Dg Chest 2 View  Result Date: 11/01/2018 CLINICAL DATA:  Elevated D-dimer. EXAM: CHEST - 2 VIEW COMPARISON:  None. FINDINGS: UPPER limits normal heart size and LEFT-sided pacemaker noted. There is no evidence of focal airspace disease, pulmonary edema, suspicious pulmonary nodule/mass, pleural effusion, or pneumothorax. No acute bony abnormalities are identified. IMPRESSION: UPPER limits normal heart size without evidence of acute cardiopulmonary disease. Electronically Signed   By: Margarette Canada M.D.   On: 11/01/2018 13:23   US Renal  Result Date: 11/01/2018 CLINICAL DATA:  Acute renal failure. EXAM: RENAL / URINARY TRACT ULTRASOUND COMPLETE COMPARISON:  None. FINDINGS: Right Kidney: Renal measurements: 11.6 x 7.6 x 7.1 cm = volume: 326 mL. Multiple cysts throughout the right kidney. No hydronephrosis. Left Kidney: Renal measurements: 13.3 x 6.5 x 5 cm = volume: 225 mL. Multiple cysts in the left kidney. No hydronephrosis. Bladder: No wall thickening or filling defects identified. IMPRESSION: Multiple bilateral renal cysts. 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 No hydronephrosis. Electronically Signed   By: Lucienne Capers M.D.   On: 11/01/2018 02:04   Nm Pulmonary Perf And Vent  Result Date: 11/01/2018 CLINICAL DATA:  67 year old female with elevated D-dimer and syncope. EXAM: NUCLEAR MEDICINE VENTILATION - PERFUSION LUNG SCAN TECHNIQUE: Ventilation images were obtained in multiple projections using inhaled aerosol Tc-75m DTPA. Perfusion images were obtained in multiple projections after intravenous injection of Tc-63m MAA. RADIOPHARMACEUTICALS:  31.8 mCi of Tc-57m DTPA aerosol inhalation and 4.4 mCi Tc81m MAA IV COMPARISON:  11/01/2018 chest radiograph FINDINGS: Ventilation: No focal ventilation defect. Perfusion: No wedge shaped peripheral perfusion defects to suggest acute pulmonary embolism. IMPRESSION: Normal VQ scan. Electronically Signed   By: Margarette Canada M.D.   On: 11/01/2018 13:22    Lab Data:  CBC: Recent Labs  Lab 10/31/18 1249 10/31/18 1255 10/31/18 1321 11/01/18 0516 11/02/18 0333 11/03/18 0430  WBC 11.5*  --   --  12.5* 6.9 7.2  NEUTROABS 7.9*  --   --   --   --   --   HGB 11.5* 10.5* 14.3 14.1 13.3 14.1  HCT 35.2* 31.0* 42.0 42.0 40.5 42.4  MCV 90.7  --   --  88.8 89.6 87.8  PLT 181  --   --  175 162 245   Basic Metabolic Panel: Recent Labs  Lab 10/31/18 1249  10/31/18 1321 10/31/18 1500 11/01/18 0516 11/02/18 0333 11/03/18 0430  NA 139   < > 135 132* 134* 137 135  K 2.2*   < > 3.3* 3.4* 3.4* 3.2* 3.4*  CL 114*   < > 98 98 102 104 103  CO2 18*  --   --  22 20* 26 23  GLUCOSE 132*   < > 193* 207* 157* 132* 157*  BUN 38*   < > 48* 53* 45* 30* 20  CREATININE 2.71*   < > 3.80* 3.64* 2.85* 2.10* 1.97*  CALCIUM 7.3*  --   --  11.1* 10.7* 11.1* 11.7*   < > = values in this interval not displayed.   GFR: Estimated Creatinine Clearance: 28.8 mL/min (A) (by C-G formula based on SCr of 1.97 mg/dL (H)). Liver Function Tests: Recent Labs  Lab 10/31/18 1249 10/31/18 1500  AST 27 163*  ALT 20 92*  ALKPHOS 85 137*  BILITOT 0.8 1.5*  PROT 4.1* 6.6  ALBUMIN 2.3* 3.7   No results for input(s): LIPASE, AMYLASE in the last 168 hours. No results for input(s): AMMONIA in the last 168 hours. Coagulation Profile: Recent Labs  Lab 10/31/18 1249  INR 1.31   Cardiac Enzymes: Recent Labs  Lab 10/31/18 1249 10/31/18 1500 10/31/18 2052 11/01/18 0516  TROPONINI 0.31* 0.46* 0.45* 0.42*   BNP (last 3 results) No results for input(s): PROBNP in the last 8760 hours. HbA1C: Recent Labs    10/31/18 1500  HGBA1C 7.0*   CBG: Recent Labs  Lab 11/02/18 1118 11/02/18 1631 11/02/18 2149 11/03/18 0732 11/03/18 1149  GLUCAP 120* 143* 173* 159* 171*   Lipid Profile: Recent Labs    11/01/18 0516  CHOL 153  HDL 24*  LDLCALC 94  TRIG 175*  CHOLHDL 6.4   Thyroid Function Tests: No results for input(s): TSH,  T4TOTAL, FREET4, T3FREE, THYROIDAB in the last 72 hours. Anemia Panel: No results for input(s): VITAMINB12, FOLATE, FERRITIN, TIBC, IRON, RETICCTPCT in the last 72 hours. Urine analysis:    Component Value Date/Time   COLORURINE YELLOW 10/31/2018 1751   APPEARANCEUR CLOUDY (A) 10/31/2018 1751   LABSPEC 1.026 10/31/2018 1751   PHURINE 5.0 10/31/2018 1751   GLUCOSEU NEGATIVE 10/31/2018 1751   HGBUR SMALL (A) 10/31/2018 1751   BILIRUBINUR NEGATIVE 10/31/2018 1751   KETONESUR NEGATIVE 10/31/2018 1751   PROTEINUR 30 (A) 10/31/2018 1751   NITRITE NEGATIVE 10/31/2018 1751   LEUKOCYTESUR LARGE (A) 10/31/2018 1751     Brodi Kari M.D. [facility] 11/03/2018, 12:57 PM  Pager: [phone] Between 7am to 7pm - call Pager - [phone]  After 7pm go to [email or web address] - password [email or web address]  Call night coverage person covering after 7pm

## 2018-11-03 NOTE — Progress Notes (Signed)
Physical Therapy Treatment Patient Details Name: Melissa Lopez MRN: [id number] DOB: 02-13-1951 Today's Date: 11/03/2018    History of Present Illness Pt is a 67 y.o. F with significant PMH of type 2 diabetes mellitus with diabetic neuropathy, chronic kidney disease, hypertension, CAD, bilateral knee replacement who presents as code STEMI after syncopal episode at home.     PT Comments    Pt admitted with above diagnosis. Pt currently with functional limitations due to dizziness and balance deficits. Pt was able to tolerate canalith repositioning for right BPPV.  Pt did dry heave after and settled her in bed. Hopeful that treatment will help pt but will need to wait and check back tomorrow.   Pt will benefit from skilled PT to increase their independence and safety with mobility to allow discharge to the venue listed below.     Follow Up Recommendations  Home health PT;Supervision/Assistance - 24 hour     Equipment Recommendations  None recommended by PT    Recommendations for Other Services       Precautions / Restrictions Precautions Precautions: Fall Precaution Comments: history of recurrent falls Restrictions Weight Bearing Restrictions: No    Mobility  Bed Mobility Overal bed mobility: Modified Independent                Transfers Overall transfer level: Needs assistance Equipment used: Rolling walker (2 wheeled) Transfers: Sit to/from Stand Sit to Stand: Min guard         General transfer comment: cues for hand placement.  Stepped toward [facility]  Ambulation/Gait                 Stairs             Wheelchair Mobility    Modified Rankin (Stroke Patients Only)       Balance Overall balance assessment: Needs assistance Sitting-balance support: Feet supported Sitting balance-Leahy Scale: Good     Standing balance support: Bilateral upper extremity supported Standing balance-Leahy Scale: Poor Standing balance comment: relies on RW for  support                            Cognition Arousal/Alertness: Awake/alert Behavior During Therapy: Flat affect Overall Cognitive Status: No family/caregiver present to determine baseline cognitive functioning                                 General Comments: Patient somewhat drowsy initially (woke up from deep sleep) with decreased awareness. Does answer questions appropriately and follow simple commands consistently      Exercises General Exercises - Lower Extremity Long Arc Quad: AROM;Both;10 reps;Seated Hip Flexion/Marching: AROM;Both;10 reps;Seated    General Comments General comments (skin integrity, edema, etc.): Tested pt for BPPV and positive for right posterior canal BPPV.  Treated with canalith repositioning maneuver with pt dry heaving after treatment therefore settled her in bed to rest. Daughter was present.       Pertinent Vitals/Pain Faces Pain Scale: No hurt    Home Living                      Prior Function            PT Goals (current goals can now be found in the care plan section) Progress towards PT goals: Progressing toward goals    Frequency    Min 3X/week  PT Plan Current plan remains appropriate    Co-evaluation              AM-PAC PT "6 Clicks" Mobility   Outcome Measure  Help needed turning from your back to your side while in a flat bed without using bedrails?: None Help needed moving from lying on your back to sitting on the side of a flat bed without using bedrails?: None Help needed moving to and from a bed to a chair (including a wheelchair)?: A Little Help needed standing up from a chair using your arms (e.g., wheelchair or bedside chair)?: A Little Help needed to walk in hospital room?: A Little Help needed climbing 3-5 steps with a railing? : A Lot 6 Click Score: 19    End of Session   Activity Tolerance: Patient tolerated treatment well(limited by dry heaving) Patient left: in  bed;with call bell/phone within reach;with family/visitor present Nurse Communication: Mobility status(need for nausea med) PT Visit Diagnosis: Unsteadiness on feet (R26.81);Difficulty in walking, not elsewhere classified (R26.2);Repeated falls (R29.6)     Time: 3009-2330 PT Time Calculation (min) (ACUTE ONLY): 13 min  Charges:  $Canalith Rep Proc: 8-22 mins                     [place] Pager:  [phone]  Office:  [place] 11/03/2018, 10:04 AM

## 2018-11-03 NOTE — Consult Note (Addendum)
[facility] Consult  Referring Provider: Mendel Corning, MD Primary Care Physician:  No primary care provider on file. Primary Gastroenterologist: Althia Forts  Reason for Consultation: Intractable nausea and vomiting  HPI: Melissa Lopez is a 67 y.o. female was admitted on 10/31/18 after code STEMI was noted by EMS. EMS was called for multiple episodes of fall due to dizziness. As per documentation prior to presentation the patient had at least 2-3 episodes of syncope. 1 week prior to presentation,she developed intractable nausea and multiple episodes of vomiting, denies vomiting blood or coffee-ground emesis, denies associated abdominal pain. She denies acid reflux, heartburn, difficulty swallowing or pain on swallowing. As per her daughter she has lost about 20 pounds in the last 1 month, although patient denies loss of appetite. She has been a diabetic for over 17 years, and reports that blood sugars are not well controlled and usually run around 170. She also smokes marijuana on a regular basis and has been doing so for several years. She lives in [place] and is visiting her daughter in [place]. She has had an EGD and colonoscopy in [place] in the last 5 years. She has had episodes of intractable nausea and vomiting in the past as well and is on omeprazole at home. Patient reports 1 bowel movement every 2 days, stools are usually hard but denies bloody bowel movement or black stools.   Past Medical History:  Diagnosis Date  . CKD (chronic kidney disease) stage 3, GFR 30-59 ml/min (HCC) 10/31/2018   - per patient report  . Coronary artery disease    per patient report - 2 caths with non-obstructive CAD  . Essential hypertension   . H/O cardiac pacemaker 10/31/2018  . Type 2 diabetes mellitus with complication, with long-term current use of insulin ([place]) 10/31/2018   with diabetic nephropathy    Past Surgical History:  Procedure Laterality Date  . CARDIAC  PACEMAKER PLACEMENT     in [place]  . LEFT HEART CATH AND CORONARY ANGIOGRAPHY N/A 10/31/2018   Procedure: LEFT HEART CATH AND CORONARY ANGIOGRAPHY;  Surgeon: Leonie Man, MD;  Location: [facility];  Service: Cardiovascular;  Laterality: N/A;    Prior to Admission medications   Medication Sig Start Date End Date Taking? Authorizing Provider  amLODipine (NORVASC) 5 MG tablet Take 5 mg by mouth every morning.    Yes [provider]  carvedilol (COREG) 25 MG tablet Take 25 mg by mouth 2 (two) times daily with a meal.   Yes [provider]  gabapentin (NEURONTIN) 300 MG capsule Take 300 mg by mouth 3 (three) times daily.   Yes [provider]  hydrALAZINE (APRESOLINE) 25 MG tablet Take 25 mg by mouth 3 (three) times daily.   Yes [provider]  lisinopril (PRINIVIL,ZESTRIL) 40 MG tablet Take 40 mg by mouth daily.   Yes [provider]  omeprazole (PRILOSEC) 20 MG capsule Take 20 mg by mouth daily.   Yes [provider]  simethicone (MYLICON) 540 MG chewable tablet Chew 125-250 mg by mouth every 6 (six) hours as needed for flatulence.   Yes [provider]  traZODone (DESYREL) 100 MG tablet Take 100 mg by mouth at bedtime.   Yes [provider]    Current Facility-Administered Medications  Medication Dose Route Frequency Provider Last Rate Last Dose  . 0.9 %  sodium chloride infusion  250 mL Intravenous PRN Lelon Perla, MD      . 0.9 %  sodium chloride infusion   Intravenous Continuous Rai, Ripudeep K, MD      . albuterol (PROVENTIL) (2.5 MG/3ML) 0.083% nebulizer solution 2.5 mg  2.5 mg Nebulization Q4H PRN Lelon Perla, MD      . amLODipine (NORVASC) tablet 5 mg  5 mg Oral Daily Hochrein, James, MD      . aspirin chewable tablet 81 mg  81 mg Oral Daily Lelon Perla, MD   81 mg at 11/02/18 0958  . cefTRIAXone (ROCEPHIN) 1 g in sodium chloride 0.9 % 100 mL IVPB  1 g Intravenous Daily Matcha,  Anupama, MD 200 mL/hr at 11/02/18 0956 1 g at 11/02/18 0956  . Influenza vac split quadrivalent PF (FLUZONE HIGH-DOSE) injection 0.5 mL  0.5 mL Intramuscular Tomorrow-1000 Lelon Perla, MD      . insulin aspart (novoLOG) injection 0-20 Units  0-20 Units Subcutaneous TID WC Lelon Perla, MD   3 Units at 11/02/18 1654  . insulin aspart (novoLOG) injection 0-5 Units  0-5 Units Subcutaneous QHS Lelon Perla, MD      . insulin aspart protamine- aspart (NOVOLOG MIX 70/30) injection 12 Units  12 Units Subcutaneous BID WC Lelon Perla, MD   12 Units at 11/01/18 1728  . metoCLOPramide (REGLAN) injection 5 mg  5 mg Intravenous Q6H Rai, Ripudeep K, MD      . morphine 2 MG/ML injection 2 mg  2 mg Intravenous Q4H PRN Lelon Perla, MD      . ondansetron [facility]) injection 4 mg  4 mg Intravenous Q6H PRN Lelon Perla, MD   4 mg at 11/03/18 0511  . pantoprazole (PROTONIX) injection 40 mg  40 mg Intravenous Q24H Rai, Ripudeep K, MD      . pneumococcal 23 valent vaccine (PNU-IMMUNE) injection 0.5 mL  0.5 mL Intramuscular Tomorrow-1000 Stanford Breed, Denice Bors, MD      . potassium chloride SA (K-DUR,KLOR-CON) CR tablet 40 mEq  40 mEq Oral Once Minus Breeding, MD      . sodium chloride flush (NS) 0.9 % injection 3 mL  3 mL Intravenous Q12H Lelon Perla, MD   3 mL at 11/02/18 2218  . sodium chloride flush (NS) 0.9 % injection 3 mL  3 mL Intravenous PRN Lelon Perla, MD        Allergies as of 10/31/2018 - Review Complete 10/31/2018  Allergen Reaction Noted  . Percocet [oxycodone-acetaminophen]  10/31/2018    Family History  Family history unknown: Yes    Social History   Socioeconomic History  . Marital status: Single    Spouse name: Not on file  . Number of children: Not on file  . Years of education: Not on file  . Highest education level: Not on file  Occupational History  . Not on file  Social Needs  . Financial resource strain: Not on file  . Food insecurity:     Worry: Not on file    Inability: Not on file  . Transportation needs:    Medical: Not on file    Non-medical: Not on file  Tobacco Use  . Smoking status: Never Smoker  . Smokeless tobacco: Never Used  Substance and Sexual Activity  . Alcohol use: Not on file  . Drug use: Not on file  . Sexual activity: Not on file  Lifestyle  . Physical activity:    Days per week: Not on file    Minutes per session: Not on file  . Stress: Not on  file  Relationships  . Social connections:    Talks on phone: Not on file    Gets together: Not on file    Attends religious service: Not on file    Active member of club or organization: Not on file    Attends meetings of clubs or organizations: Not on file    Relationship status: Not on file  . Intimate partner violence:    Fear of current or ex partner: Not on file    Emotionally abused: Not on file    Physically abused: Not on file    Forced sexual activity: Not on file  Other Topics Concern  . Not on file  Social History Narrative   Visiting her granddaughter in [place] - lives in [place].    Review of Systems: Positive for: GI: Described in detail in HPI.    Gen: involuntary weight loss, fatigue, weakness,Denies any fever, chills, rigors, night sweats, anorexia,  malaise, and sleep disorder CV: Denies chest pain, angina, palpitations, syncope, orthopnea, PND, peripheral edema, and claudication. Resp: dyspnea, Denies cough, sputum, wheezing, coughing up blood. GU : Denies urinary burning, blood in urine, urinary frequency, urinary hesitancy, nocturnal urination, and urinary incontinence. MS: bilateral knee replacement,Denies joint pain or swelling.  Denies muscle weakness, cramps, atrophy.  Derm: Denies rash, itching, oral ulcerations, hives, unhealing ulcers.  Psych: Denies depression, anxiety, memory loss, suicidal ideation, hallucinations,  and confusion. Heme: Denies bruising, bleeding, and enlarged lymph nodes. Neuro:  dizziness, Denies  any headaches, paresthesias. Endo:  DM, Denies any problems with thyroid, adrenal function.  Physical Exam: Vital signs in last 24 hours: Temp:  [98 F (36.7 C)-98.2 F (36.8 C)] 98 F (36.7 C) (11/26 0539) Pulse Rate:  [59-64] 64 (11/26 0824) Resp:  [18-19] 18 (11/26 0539) BP: (157-176)/(81-97) 170/97 (11/26 0824) SpO2:  [93 %-97 %] 96 % (11/26 0539) Weight:  [86.1 kg] 86.1 kg (11/26 0539) Last BM Date: 10/26/18(per patient)  General:   Has an emesis bag at bedside, in distress from intractable nausea, currently not vomiting Head:  Normocephalic and atraumatic. Eyes:  Sclera clear, no icterus.   Conjunctiva pink. Ears:  Normal auditory acuity. Nose:  No deformity, discharge,  or lesions. Mouth:  No deformity or lesions.  Oropharynx pink & moist. Neck:  Supple; no masses or thyromegaly. Lungs:  Clear throughout to auscultation.   No wheezes, crackles, or rhonchi. No acute distress. Heart:  Regular rate and rhythm; no murmurs, clicks, rubs,  or gallops. Extremities:  Without clubbing or edema. Neurologic:  Alert and  oriented x4;  grossly normal neurologically. Skin:  Intact without significant lesions or rashes. Psych:  Alert and cooperative. Normal mood and affect. Abdomen:  Soft, nontender and nondistended. No masses, hepatosplenomegaly or hernias noted. Normal bowel sounds, without guarding, and without rebound.         Lab Results: Recent Labs    11/01/18 0516 11/02/18 0333 11/03/18 0430  WBC 12.5* 6.9 7.2  HGB 14.1 13.3 14.1  HCT 42.0 40.5 42.4  PLT 175 162 177   BMET Recent Labs    11/01/18 0516 11/02/18 0333 11/03/18 0430  NA 134* 137 135  K 3.4* 3.2* 3.4*  CL 102 104 103  CO2 20* 26 23  GLUCOSE 157* 132* 157*  BUN 45* 30* 20  CREATININE 2.85* 2.10* 1.97*  CALCIUM 10.7* 11.1* 11.7*   LFT Recent Labs    10/31/18 1500  PROT 6.6  ALBUMIN 3.7  AST 163*  ALT 92*  ALKPHOS 137*  BILITOT 1.5*   PT/INR Recent Labs    10/31/18 1249  LABPROT  16.1*  INR 1.31    Studies/Results: Dg Chest 2 View  Result Date: 11/01/2018 CLINICAL DATA:  Elevated D-dimer. EXAM: CHEST - 2 VIEW COMPARISON:  None. FINDINGS: UPPER limits normal heart size and LEFT-sided pacemaker noted. There is no evidence of focal airspace disease, pulmonary edema, suspicious pulmonary nodule/mass, pleural effusion, or pneumothorax. No acute bony abnormalities are identified. IMPRESSION: UPPER limits normal heart size without evidence of acute cardiopulmonary disease. Electronically Signed   By: Margarette Canada M.D.   On: 11/01/2018 13:23   Nm Pulmonary Perf And Vent  Result Date: 11/01/2018 CLINICAL DATA:  67 year old female with elevated D-dimer and syncope. EXAM: NUCLEAR MEDICINE VENTILATION - PERFUSION LUNG SCAN TECHNIQUE: Ventilation images were obtained in multiple projections using inhaled aerosol Tc-69m DTPA. Perfusion images were obtained in multiple projections after intravenous injection of Tc-43m MAA. RADIOPHARMACEUTICALS:  31.8 mCi of Tc-37m DTPA aerosol inhalation and 4.4 mCi Tc65m MAA IV COMPARISON:  11/01/2018 chest radiograph FINDINGS: Ventilation: No focal ventilation defect. Perfusion: No wedge shaped peripheral perfusion defects to suggest acute pulmonary embolism. IMPRESSION: Normal VQ scan. Electronically Signed   By: Margarette Canada M.D.   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 Agree with IV Reglan for short term management, would not recommend the same for long-term therapy due to associated side effects. Has not received the first dose yet. If needed, will increase dose to 10 mg IV every 6 hours.  May need IV Phenergan, if Reglan alone does not control her  symptoms,she is also on Zofran 4 mg IV every 6 hours as needed and Protonix 40 mg IV every 24 hours,normal saline at 75 mL per hour.  Discussed about side effects of chronic marijuana use and emphasized on quitting marijuana.  Patient has not had a bowel movement for several days, she will likely not be able to tolerate by oral laxative, will give 1 dose of Dulcolax suppository and order it on a when necessary basis.   LOS: 3 days   Ronnette Juniper, MD  11/03/2018, 10:10 AM  Pager [phone] If no answer or after 5 PM call [phone]

## 2018-11-03 NOTE — Progress Notes (Signed)
Progress Note  Patient Name: Melissa Lopez Date of Encounter: 11/03/2018  Primary Cardiologist:   No primary care provider on file.   Subjective   She is still having nausea and vomiting.  Unable to keep food down.   Inpatient Medications    Scheduled Meds: . aspirin  81 mg Oral Daily  . Influenza vac split quadrivalent PF  0.5 mL Intramuscular Tomorrow-1000  . insulin aspart  0-20 Units Subcutaneous TID WC  . insulin aspart  0-5 Units Subcutaneous QHS  . insulin aspart protamine- aspart  12 Units Subcutaneous BID WC  . metoCLOPramide  5 mg Oral TID AC  . pantoprazole  40 mg Oral Q0600  . pneumococcal 23 valent vaccine  0.5 mL Intramuscular Tomorrow-1000  . sodium chloride flush  3 mL Intravenous Q12H   Continuous Infusions: . sodium chloride    . cefTRIAXone (ROCEPHIN)  IV 1 g (11/02/18 0956)   PRN Meds: sodium chloride, albuterol, morphine injection, ondansetron (ZOFRAN) IV, sodium chloride flush   Vital Signs    Vitals:   11/02/18 0607 11/02/18 1327 11/02/18 2028 11/03/18 0539  BP: (!) 157/94 (!) 167/92 (!) 157/87 (!) 176/81  Pulse: 68 (!) 59 (!) 59 61  Resp: (!) 22 18 19 18   Temp: 98.3 F (36.8 C) 98 F (36.7 C) 98.2 F (36.8 C) 98 F (36.7 C)  TempSrc: Oral Oral Oral Oral  SpO2:  97% 93% 96%  Weight: 85.9 kg   86.1 kg  Height:        Intake/Output Summary (Last 24 hours) at 11/03/2018 0745 Last data filed at 11/03/2018 0000 Gross per 24 hour  Intake 120 ml  Output 1150 ml  Net -1030 ml   Filed Weights   11/01/18 0500 11/02/18 0607 11/03/18 0539  Weight: 87.3 kg 85.9 kg 86.1 kg    Telemetry    NSR, intermittent atrial pacing- Personally Reviewed  ECG    NA - Personally Reviewed  Physical Exam   GEN: No  acute distress.   Neck: No  JVD Cardiac: RRR, no murmurs, rubs, or gallops.  Respiratory: Clear   to auscultation bilaterally. GI: Soft, nontender, non-distended, normal bowel sounds  MS:  No edema; No deformity. Neuro:    Nonfocal  Psych: Oriented and appropriate    Labs    Chemistry Recent Labs  Lab 10/31/18 1249  10/31/18 1500 11/01/18 0516 11/02/18 0333 11/03/18 0430  NA 139   < > 132* 134* 137 135  K 2.2*   < > 3.4* 3.4* 3.2* 3.4*  CL 114*   < > 98 102 104 103  CO2 18*  --  22 20* 26 23  GLUCOSE 132*   < > 207* 157* 132* 157*  BUN 38*   < > 53* 45* 30* 20  CREATININE 2.71*   < > 3.64* 2.85* 2.10* 1.97*  CALCIUM 7.3*  --  11.1* 10.7* 11.1* 11.7*  PROT 4.1*  --  6.6  --   --   --   ALBUMIN 2.3*  --  3.7  --   --   --   AST 27  --  163*  --   --   --   ALT 20  --  92*  --   --   --   ALKPHOS 85  --  137*  --   --   --   BILITOT 0.8  --  1.5*  --   --   --   GFRNONAA 17*  --  12* 16* 23* 25*  GFRAA 20*  --  14* 19* 27* 29*  ANIONGAP 7  --  12 12 7 9    < > = values in this interval not displayed.     Hematology Recent Labs  Lab 11/01/18 0516 11/02/18 0333 11/03/18 0430  WBC 12.5* 6.9 7.2  RBC 4.73 4.52 4.83  HGB 14.1 13.3 14.1  HCT 42.0 40.5 42.4  MCV 88.8 89.6 87.8  MCH 29.8 29.4 29.2  MCHC 33.6 32.8 33.3  RDW 12.1 12.1 11.9  PLT 175 162 177    Cardiac Enzymes Recent Labs  Lab 10/31/18 1249 10/31/18 1500 10/31/18 2052 11/01/18 0516  TROPONINI 0.31* 0.46* 0.45* 0.42*    Recent Labs  Lab 10/31/18 1253  TROPIPOC 0.33*     BNPNo results for input(s): BNP, PROBNP in the last 168 hours.   DDimer  Recent Labs  Lab 10/31/18 1526  DDIMER 0.82*     Radiology    Dg Chest 2 View  Result Date: 11/01/2018 CLINICAL DATA:  Elevated D-dimer. EXAM: CHEST - 2 VIEW COMPARISON:  None. FINDINGS: UPPER limits normal heart size and LEFT-sided pacemaker noted. There is no evidence of focal airspace disease, pulmonary edema, suspicious pulmonary nodule/mass, pleural effusion, or pneumothorax. No acute bony abnormalities are identified. IMPRESSION: UPPER limits normal heart size without evidence of acute cardiopulmonary disease. Electronically Signed   By: Margarette Canada M.D.   On:  11/01/2018 13:23   Nm Pulmonary Perf And Vent  Result Date: 11/01/2018 CLINICAL DATA:  67 year old female with elevated D-dimer and syncope. EXAM: NUCLEAR MEDICINE VENTILATION - PERFUSION LUNG SCAN TECHNIQUE: Ventilation images were obtained in multiple projections using inhaled aerosol Tc-86m DTPA. Perfusion images were obtained in multiple projections after intravenous injection of Tc-41m MAA. RADIOPHARMACEUTICALS:  31.8 mCi of Tc-11m DTPA aerosol inhalation and 4.4 mCi Tc46m MAA IV COMPARISON:  11/01/2018 chest radiograph FINDINGS: Ventilation: No focal ventilation defect. Perfusion: No wedge shaped peripheral perfusion defects to suggest acute pulmonary embolism. IMPRESSION: Normal VQ scan. Electronically Signed   By: Margarette Canada M.D.   On: 11/01/2018 13:22    Cardiac Studies   ECHO:   Study Conclusions  - Left ventricle: The cavity size was normal. Wall thickness was   increased in a pattern of severe LVH. Systolic function was   normal. The estimated ejection fraction was in the range of 55%   to 60%. There is hypokinesis of the apical myocardium. Doppler   parameters are consistent with abnormal left ventricular   relaxation (grade 1 diastolic dysfunction). - Pericardium, extracardiac: A trivial pericardial effusion was   identified.  Impressions:  - Definity used; apical hypokinesis with overall normal LV systolic   function; mild diastolic dysfunction; severe LVH more prominent   at apex; consider apical variant hypertrophic cardiomyopathy or   infiltrative cardiomyopathy.  CATH Diagnostic Diagram        Patient Profile     67 y.o. female with past medical history of pacemaker, diabetes mellitus, chronic stage III kidney disease, hypertension admitted with chest pain and syncope.  Cardiac catheterization revealed 70% ostial ramus relatively small in caliber, 60% ostial diagonal, and very small occluded first marginal.  There was no likely culprit for angina.   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   Assessment & Plan    ELEVATED TROPONIN:  Branch vessel disease.  Continue current therapy.   CKD III:   Hydrated, holding ACE inhibitor.  Creat continues to improved.    DM:   Per IM.  Insulin was restarted at lower dose than home.  Blood sugars are controlled.    DIZZY:   Less dizzy.  Ambulated.  See PT notes.  Likely related to multiple comorbid illnesses with dehydration.  They are to return fora vestibular evaluation today.  HTN:  Held antihypertensives yesterday.  Start Norvasc   PACEMAKER:  Normal function.  No change in therapy.     N/V:  Thought to be gastroparesis.    I will ask primary care for other recommendations or to suggest GI consult if they think this is of value.  We are not getting anywhere with this.   HYPOKALEMIA:  Supplement again today.  Send home on [place] 40 meq daily.    DISPOSITION:  Home when nausea improved.   LEUKOCYTOSIS:  Complete atb today and then off today.     For questions or updates, please contact [place] Please consult [email or web address] for contact info under Cardiology/STEMI.   Signed, Minus Breeding, MD  11/03/2018, 7:45 AM

## 2018-11-04 LAB — BASIC METABOLIC PANEL
Anion gap: 8 (ref 5–15)
BUN: 17 mg/dL (ref 8–23)
CHLORIDE: 104 mmol/L (ref 98–111)
CO2: 23 mmol/L (ref 22–32)
CREATININE: 1.85 mg/dL — AB (ref 0.44–1.00)
Calcium: 11.7 mg/dL — ABNORMAL HIGH (ref 8.9–10.3)
GFR, EST AFRICAN AMERICAN: 32 mL/min — AB (ref >60)
GFR, EST NON AFRICAN AMERICAN: 28 mL/min — AB (ref >60)
Glucose, Bld: 136 mg/dL — ABNORMAL HIGH (ref 70–99)
POTASSIUM: 3.5 mmol/L (ref 3.5–5.1)
Sodium: 135 mmol/L (ref 135–145)

## 2018-11-04 LAB — CBC
HCT: 44.7 % (ref 36.0–46.0)
HEMOGLOBIN: 15.1 g/dL — AB (ref 12.0–15.0)
MCH: 29.6 pg (ref 26.0–34.0)
MCHC: 33.8 g/dL (ref 30.0–36.0)
MCV: 87.6 fL (ref 80.0–100.0)
NRBC: 0 % (ref 0.0–0.2)
PLATELETS: 186 10*3/uL (ref 150–400)
RBC: 5.1 MIL/uL (ref 3.87–5.11)
RDW: 12.1 % (ref 11.5–15.5)
WBC: 8.1 10*3/uL (ref 4.0–10.5)

## 2018-11-04 LAB — GLUCOSE, CAPILLARY
GLUCOSE-CAPILLARY: 161 mg/dL — AB (ref 70–99)
GLUCOSE-CAPILLARY: 161 mg/dL — AB (ref 70–99)
GLUCOSE-CAPILLARY: 117 mg/dL — AB (ref 70–99)
GLUCOSE-CAPILLARY: 146 mg/dL — AB (ref 70–99)

## 2018-11-04 LAB — T4, FREE: Free T4: 1.32 ng/dL (ref 0.82–1.77)

## 2018-11-04 LAB — TSH: TSH: 1.475 u[IU]/mL (ref 0.350–4.500)

## 2018-11-04 MED ORDER — HYDRALAZINE HCL 25 MG PO TABS
25.0000 mg | ORAL_TABLET | Freq: Three times a day (TID) | ORAL | Status: DC
  Administered 2018-11-04 – 2018-11-10 (×20): 25 mg via ORAL
  Filled 2018-11-04 (×20): qty 1

## 2018-11-04 MED ORDER — POTASSIUM CHLORIDE CRYS ER 20 MEQ PO TBCR
40.0000 meq | EXTENDED_RELEASE_TABLET | Freq: Every day | ORAL | Status: DC
  Administered 2018-11-04 – 2018-11-12 (×7): 40 meq via ORAL
  Filled 2018-11-04 (×9): qty 2

## 2018-11-04 MED ORDER — AMLODIPINE BESYLATE 10 MG PO TABS
10.0000 mg | ORAL_TABLET | Freq: Every day | ORAL | Status: DC
  Administered 2018-11-05 – 2018-11-12 (×8): 10 mg via ORAL
  Filled 2018-11-04 (×8): qty 1

## 2018-11-04 MED ORDER — CARVEDILOL 6.25 MG PO TABS
6.2500 mg | ORAL_TABLET | Freq: Two times a day (BID) | ORAL | Status: DC
  Administered 2018-11-04: 6.25 mg via ORAL
  Filled 2018-11-04 (×2): qty 1

## 2018-11-04 MED ORDER — POLYETHYLENE GLYCOL 3350 17 G PO PACK
17.0000 g | PACK | Freq: Three times a day (TID) | ORAL | Status: DC
  Administered 2018-11-04 – 2018-11-07 (×9): 17 g via ORAL
  Filled 2018-11-04 (×9): qty 1

## 2018-11-04 NOTE — Progress Notes (Signed)
Physical Therapy Treatment Patient Details Name: Melissa Lopez MRN: [id number] DOB: 1951-07-05 Today's Date: 11/04/2018    History of Present Illness Pt is a 67 y.o. F with significant PMH of type 2 diabetes mellitus with diabetic neuropathy, chronic kidney disease, hypertension, CAD, bilateral knee replacement who presents as code STEMI after syncopal episode at home.     PT Comments    Pt admitted with above diagnosis. Pt currently with functional limitations due to balance and endurance deficits. Pt was able to ambulate with RW with good stability today. Dizziness is gone per pt. Pt able to progress ambulation distance.  Will continue to follow pt.   Pt will benefit from skilled PT to increase their independence and safety with mobility to allow discharge to the venue listed below.     Follow Up Recommendations  Home health PT;Supervision/Assistance - 24 hour     Equipment Recommendations  None recommended by PT    Recommendations for Other Services       Precautions / Restrictions Precautions Precautions: Fall Precaution Comments: history of recurrent falls Restrictions Weight Bearing Restrictions: No    Mobility  Bed Mobility Overal bed mobility: Modified Independent                Transfers Overall transfer level: Needs assistance Equipment used: Rolling walker (2 wheeled) Transfers: Sit to/from Stand Sit to Stand: Min guard         General transfer comment: cues for hand placement.  Stepped toward [facility]  Ambulation/Gait Ambulation/Gait assistance: Min guard Gait Distance (Feet): 150 Feet Assistive device: Rolling walker (2 wheeled) Gait Pattern/deviations: Step-through pattern;Decreased stride length Gait velocity: decreased Gait velocity interpretation: <1.31 ft/sec, indicative of household ambulator General Gait Details: No issues with balance today.  Good safety with RW.  Pt reports dizziness resolved after treatment yesterday.  Pt feelling much  better.  Guard assist for safety however pt was able to take some challenges to balance today.    Stairs             Wheelchair Mobility    Modified Rankin (Stroke Patients Only)       Balance Overall balance assessment: Needs assistance Sitting-balance support: Feet supported Sitting balance-Leahy Scale: Good     Standing balance support: Bilateral upper extremity supported Standing balance-Leahy Scale: Poor Standing balance comment: relies on RW for support                            Cognition Arousal/Alertness: Awake/alert Behavior During Therapy: Flat affect Overall Cognitive Status: Within Functional Limits for tasks assessed                                        Exercises General Exercises - Lower Extremity Long Arc Quad: AROM;Both;10 reps;Seated Hip Flexion/Marching: AROM;Both;10 reps;Seated    General Comments        Pertinent Vitals/Pain Faces Pain Scale: No hurt    Home Living                      Prior Function            PT Goals (current goals can now be found in the care plan section) Progress towards PT goals: Progressing toward goals    Frequency    Min 3X/week      PT Plan Current plan remains appropriate  Co-evaluation              AM-PAC PT "6 Clicks" Mobility   Outcome Measure  Help needed turning from your back to your side while in a flat bed without using bedrails?: None Help needed moving from lying on your back to sitting on the side of a flat bed without using bedrails?: None Help needed moving to and from a bed to a chair (including a wheelchair)?: A Little Help needed standing up from a chair using your arms (e.g., wheelchair or bedside chair)?: A Little Help needed to walk in hospital room?: A Little Help needed climbing 3-5 steps with a railing? : A Lot 6 Click Score: 19    End of Session Equipment Utilized During Treatment: Gait belt Activity Tolerance: Patient  tolerated treatment well Patient left: with call bell/phone within reach;with family/visitor present;in bed Nurse Communication: Mobility status PT Visit Diagnosis: Unsteadiness on feet (R26.81);Difficulty in walking, not elsewhere classified (R26.2);Repeated falls (R29.6)     Time: 1205-1221 PT Time Calculation (min) (ACUTE ONLY): 16 min  Charges:  $Gait Training: 8-22 mins                     [place] Pager:  [phone]  Office:  [place] 11/04/2018, 2:19 PM

## 2018-11-04 NOTE — Progress Notes (Signed)
Subjective: The patient was seen and examined at bedside. She had dry heaving until 3 AM, thereafter, nausea and vomiting have been under control, she has been able to tolerate juices in the morning. She is frustrated about not being able to have a bowel movement despite using suppository and an enema yesterday evening.  Objective: Vital signs in last 24 hours: Temp:  [98 F (36.7 C)-98.3 F (36.8 C)] 98.2 F (36.8 C) (11/27 0314) Pulse Rate:  [59-68] 63 (11/27 0314) Resp:  [11-21] 14 (11/27 0314) BP: (158-202)/(84-95) 202/95 (11/27 1019) SpO2:  [92 %-100 %] 100 % (11/27 0314) Weight:  [84.3 kg] 84.3 kg (11/27 0313) Weight change: -1.769 kg Last BM Date: 10/26/18  PE:was on a bedside commode GENERAL:no pallor, no icterus EXTREMITIES:no deformity  Lab Results: Results for orders placed or performed during the hospital encounter of 10/31/18 (from the past 48 hour(s))  Glucose, capillary     Status: Abnormal   Collection Time: 11/02/18 11:18 AM  Result Value Ref Range   Glucose-Capillary 120 (H) 70 - 99 mg/dL  Cortisol     Status: None   Collection Time: 11/02/18 12:02 PM  Result Value Ref Range   Cortisol, Plasma 19.6 ug/dL    Comment: (NOTE) AM    6.7 - 22.6 ug/dL PM   <10.0       ug/dL Performed at [facility] [address] [place], [place] [address]   Urine Culture     Status: None   Collection Time: 11/02/18  3:21 PM  Result Value Ref Range   Specimen Description URINE, RANDOM    Special Requests NONE    Culture      NO GROWTH Performed at [facility], [place] [address], [place], [place] [address]    Report Status 11/03/2018 FINAL   Glucose, capillary     Status: Abnormal   Collection Time: 11/02/18  4:31 PM  Result Value Ref Range   Glucose-Capillary 143 (H) 70 - 99 mg/dL  Glucose, capillary     Status: Abnormal   Collection Time: 11/02/18  9:49 PM  Result Value Ref Range   Glucose-Capillary 173 (H) 70 - 99 mg/dL  CBC     Status: None   Collection Time: 11/03/18  4:30 AM  Result Value Ref Range   WBC 7.2 4.0 - 10.5 K/uL   RBC 4.83 3.87 - 5.11 MIL/uL   Hemoglobin 14.1 12.0 - 15.0 g/dL   HCT 42.4 36.0 - 46.0 %   MCV 87.8 80.0 - 100.0 fL   MCH 29.2 26.0 - 34.0 pg   MCHC 33.3 30.0 - 36.0 g/dL   RDW 11.9 11.5 - 15.5 %   Platelets 177 150 - 400 K/uL   nRBC 0.0 0.0 - 0.2 %    Comment: Performed at [facility], [place] [address] [place], [place] [address]  Basic metabolic panel     Status: Abnormal   Collection Time: 11/03/18  4:30 AM  Result Value Ref Range   Sodium 135 135 - 145 mmol/L   Potassium 3.4 (L) 3.5 - 5.1 mmol/L   Chloride 103 98 - 111 mmol/L   CO2 23 22 - 32 mmol/L   Glucose, Bld 157 (H) 70 - 99 mg/dL   BUN 20 8 - 23 mg/dL   Creatinine, Ser 1.97 (H) 0.44 - 1.00 mg/dL   Calcium 11.7 (H) 8.9 - 10.3 mg/dL   GFR calc non Af Amer 25 (L) >60 mL/min   GFR calc  Af Amer 29 (L) >60 mL/min    Comment: (NOTE) The eGFR has been calculated using the CKD EPI equation. This calculation has not been validated in all clinical situations. eGFR's persistently <60 mL/min signify possible Chronic Kidney Disease.    Anion gap 9 5 - 15    Comment: Performed at [facility] [address], [place], [place] [address]  Glucose, capillary     Status: Abnormal   Collection Time: 11/03/18  7:32 AM  Result Value Ref Range   Glucose-Capillary 159 (H) 70 - 99 mg/dL  Glucose, capillary     Status: Abnormal   Collection Time: 11/03/18 11:49 AM  Result Value Ref Range   Glucose-Capillary 171 (H) 70 - 99 mg/dL  Glucose, capillary     Status: Abnormal   Collection Time: 11/03/18  4:33 PM  Result Value Ref Range   Glucose-Capillary 109 (H) 70 - 99 mg/dL  Glucose, capillary     Status: Abnormal   Collection Time: 11/03/18 10:20 PM  Result Value Ref Range   Glucose-Capillary 132 (H) 70 - 99 mg/dL  CBC     Status: Abnormal   Collection Time: 11/04/18  4:18 AM  Result Value Ref Range   WBC 8.1 4.0 - 10.5 K/uL   RBC 5.10  3.87 - 5.11 MIL/uL   Hemoglobin 15.1 (H) 12.0 - 15.0 g/dL   HCT 44.7 36.0 - 46.0 %   MCV 87.6 80.0 - 100.0 fL   MCH 29.6 26.0 - 34.0 pg   MCHC 33.8 30.0 - 36.0 g/dL   RDW 12.1 11.5 - 15.5 %   Platelets 186 150 - 400 K/uL   nRBC 0.0 0.0 - 0.2 %    Comment: Performed at [facility], [place] [address], [place], [place] [address]  Basic metabolic panel     Status: Abnormal   Collection Time: 11/04/18  4:18 AM  Result Value Ref Range   Sodium 135 135 - 145 mmol/L   Potassium 3.5 3.5 - 5.1 mmol/L   Chloride 104 98 - 111 mmol/L   CO2 23 22 - 32 mmol/L   Glucose, Bld 136 (H) 70 - 99 mg/dL   BUN 17 8 - 23 mg/dL   Creatinine, Ser 1.85 (H) 0.44 - 1.00 mg/dL   Calcium 11.7 (H) 8.9 - 10.3 mg/dL   GFR calc non Af Amer 28 (L) >60 mL/min   GFR calc Af Amer 32 (L) >60 mL/min   Anion gap 8 5 - 15    Comment: Performed at [facility] [address] [place], [place] [address]  Glucose, capillary     Status: Abnormal   Collection Time: 11/04/18  8:10 AM  Result Value Ref Range   Glucose-Capillary 161 (H) 70 - 99 mg/dL    Studies/Results: No results found.  Medications: I have reviewed the patient's current medications.  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 Meanwhile discussed about importance of quitting marijuana use, continue small frequent meals of low fiber/low residue diet and good control of blood sugar.  We will start on oral MiraLAX for constipation. Recommend minimizing use of narcotics.  Ronnette Juniper 11/04/2018, 10:27 AM   Pager [phone] If no answer or  after 5 PM call [phone]

## 2018-11-04 NOTE — Progress Notes (Signed)
Patient still having nausea and has been vomiting throughout the night.  Reglan stops the nausea for a bit but picks up again when time is due for medications.  I have been given Zofran in between Reglan dose.  I will keep monitoring patient.

## 2018-11-04 NOTE — Care Management Important Message (Signed)
Important Message  Patient Details  Name: Melissa Lopez MRN: [id number] Date of Birth: 01/31/1951   Medicare Important Message Given:  Yes    Mizani Dilday P Dondrea Clendenin 11/04/2018, 4:09 PM

## 2018-11-04 NOTE — Progress Notes (Signed)
[facility] progress note                                                                               Patient Demographics  Melissa Lopez, is a 67 y.o. female, DOB - 03-30-1951, YQM:[id number]  Admit date - 10/31/2018   Admitting Physician Melissa Man, MD  Outpatient Primary MD for the patient is No primary care provider on file.  Outpatient specialists:   LOS - 4  days   Medical records reviewed and are as summarized below:    No chief complaint on file.      Brief summary   Melissa Honeycuttis an 67 y.o.femalewith history of type 2 diabetes mellitus with diabetic neuropathy, chronic kidney disease? Stage II, hypertension, CAD, arthritis requiring bilateral knee replacement presented as code STEMI after a near syncopal event at home. Patient admitted to cardiology service and underwent emergent cardiac cath and concern for EKG changes and abnormal troponin. Cardiac cath was unremarkable and did not explain her symptoms. Medicine service consulted for further evaluation. Patient has had dizzy spells, described as lightheaded feeling, for a long time and mostly on walking. She has been walker dependent at baseline due to bilateral arthritis and ataxia/periodic dizziness. The last episode was also associated with some chest discomfort. 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 She takes PPI at baseline. She has chronic exertional dyspnea with no new changes.Labs on presentation showed potassium of 2.2, creatinine of 2.7 (repeat level at 3.8), troponin of 0.33 and EKG showedwide-complex paced rhythm with ST changes.   Assessment & Plan  Chest pain: LHC on 10/31/2018 with mild diffuse disease.   -Cardiology following-medical management -We will resume beta-blocker as able.  Syncope/near syncope, falls: Orthostatic vitals, VQ scan, cardiac cath  and cortisol level not impressive.  Patient has a pacemaker placed in [place].  Concern for right BPPV per PT. Also concern for autonomic dysfunction in the setting of diabetes although this seems to be fairly controlled. -May need outpatient vestibular rehab for BPPV  Nausea and vomiting: Improved.  Tolerating clear liquids.  Differential diagnosis include gastroparesis and cannabinoid hyperemesis -GI on board (planning gastric emptying scan). However, patient is still on Reglan.  -Continue PPI. -Advance to full liquid.  Diastolic CHF: Echocardiogram on 10/31/2018 with EF of 50 to 60%,, severe LVH, hypokinesis of apical myocardium and G1DD.  Currently without cardiopulmonary symptoms.  Appears euvolemic. -Continue daily weight, intake output and renal function.  Type 2 diabetes mellitus, IDDM : A1c 7.0% -Continue CBG monitoring -Continue NovoLog 70/30 20 units twice a day  Mild leukocytosis, UTI: Leukocytosis resolved.  Patient without urinary symptoms.  Urine cultures negative. -Discontinue ceftriaxone 11/23-11/27  Acute on CKD (chronic kidney disease) stage 3: Stable -Continue monitoring  Essential hypertension: SBP slightly elevated. -Continue home amlodipine -Restart Coreg at 6.25 mg twice daily  Hypercalcemia:  -Check PTH, PTHrp, thyroid functions and vit D level.  Code Status: Full code DVT Prophylaxis: Start Lovenox Family Communication: No family member at bedside.  Disposition Plan: Pending work-up and clinical provement.  Procedures:  Cardiac  cath  LV end diastolic pressure is mildly elevated.  Ost Ramus lesion is 70% stenosed -relatively small caliber vessel. Not likely because of resting chest pain and ST elevations.  Ost 1st Diag lesion is 60% stenosed. -Not likely flow-limiting.  Very small caliber (<66mm) 1st Mrg lesion appears to be 100% stenosed with faint collateralization. Too small for angioplasty or stenting.   SUMMARY:  Angiographically mild  diffuse disease, only potentially occluded vessels are very very small branch of the major obtuse marginal.  Not likely a culprit lesion for significant anginal chest pain. Mild to moderately elevated LVEDP.  LV gram not performed due to renal insufficiency.   2D echo Study Conclusions  - Left ventricle: The cavity size was normal. Wall thickness was   increased in a pattern of severe LVH. Systolic function was   normal. The estimated ejection fraction was in the range of 55%   to 60%. There is hypokinesis of the apical myocardium. Doppler   parameters are consistent with abnormal left ventricular   relaxation (grade 1 diastolic dysfunction). - Pericardium, extracardiac: A trivial pericardial effusion was   identified.  Antimicrobials:   Rocephin   Medications  Scheduled Meds: . [START ON 11/05/2018] amLODipine  10 mg Oral Daily  . aspirin  81 mg Oral Daily  . hydrALAZINE  25 mg Oral Q8H  . Influenza vac split quadrivalent PF  0.5 mL Intramuscular Tomorrow-1000  . insulin aspart  0-20 Units Subcutaneous TID WC  . insulin aspart  0-5 Units Subcutaneous QHS  . insulin aspart protamine- aspart  12 Units Subcutaneous BID WC  . metoCLOPramide (REGLAN) injection  5 mg Intravenous Q6H  . pantoprazole (PROTONIX) IV  40 mg Intravenous Q24H  . pneumococcal 23 valent vaccine  0.5 mL Intramuscular Tomorrow-1000  . polyethylene glycol  17 g Oral TID  . potassium chloride  40 mEq Oral Daily  . sodium chloride flush  3 mL Intravenous Q12H   Continuous Infusions: . sodium chloride 10 mL/hr at 11/04/18 1542  . cefTRIAXone (ROCEPHIN)  IV Stopped (11/04/18 1107)   PRN Meds:.sodium chloride, albuterol, bisacodyl, morphine injection, ondansetron (ZOFRAN) IV, sodium chloride flush   Antibiotics   Anti-infectives (From admission, onward)   Start     Dose/Rate Route Frequency Ordered Stop   11/01/18 1100  cefTRIAXone (ROCEPHIN) 1 g in sodium chloride 0.9 % 100 mL IVPB     1 g 200 mL/hr  over 30 Minutes Intravenous Daily 11/01/18 1025          Subjective:  Reports improvement this morning.  Tolerating clear liquids.  Denies chest pain, dyspnea and abdominal pain.   Objective:   Vitals:   11/04/18 1019 11/04/18 1346 11/04/18 1458 11/04/18 1543  BP: (!) 202/95 (!) 163/86 (!) 168/84 (!) 172/96  Pulse:   64   Resp:      Temp:   98.3 F (36.8 C)   TempSrc:   Oral   SpO2:   99%   Weight:      Height:        Intake/Output Summary (Last 24 hours) at 11/04/2018 1557 Last data filed at 11/04/2018 1300 Gross per 24 hour  Intake 2225.94 ml  Output 900 ml  Net 1325.94 ml     Wt Readings from Last 3 Encounters:  11/04/18 84.3 kg    Physical Exam GENERAL: appears well, no ditress HEENT: PERRLA, MMM.  Hearing grossly intact. NECK: Supple.  No JVD LUNGS:  No IWOB, good air movement, CTAB HEART:  RRR.  Normal rate.  Heart sounds normal. ABD:  soft, NT with active BS EXT:   no edema. Moving all extremities symmetrically NEURO: Awake, alert and oriented appropriately.  No gross deficit. PSYCH: normal affect  Data Reviewed:  I have personally reviewed following labs and imaging studies  Micro Results Recent Results (from the past 240 hour(s))  MRSA PCR Screening     Status: None   Collection Time: 10/31/18  2:24 PM  Result Value Ref Range Status   MRSA by PCR NEGATIVE NEGATIVE Final    Comment:        The GeneXpert MRSA Assay (FDA approved for NASAL specimens only), is one component of a comprehensive MRSA colonization surveillance program. It is not intended to diagnose MRSA infection nor to guide or monitor treatment for MRSA infections. Performed at [facility], [place] [address], [place], [place] [address]   Urine Culture     Status: None   Collection Time: 11/02/18  3:21 PM  Result Value Ref Range Status   Specimen Description URINE, RANDOM  Final   Special Requests NONE  Final   Culture   Final    NO GROWTH Performed at [facility], [place] [address], [place], [place] [address]    Report Status 11/03/2018 FINAL  Final    Radiology Reports Dg Chest 2 View  Result Date: 11/01/2018 CLINICAL DATA:  Elevated D-dimer. EXAM: CHEST - 2 VIEW COMPARISON:  None. FINDINGS: UPPER limits normal heart size and LEFT-sided pacemaker noted. There is no evidence of focal airspace disease, pulmonary edema, suspicious pulmonary nodule/mass, pleural effusion, or pneumothorax. No acute bony abnormalities are identified. IMPRESSION: UPPER limits normal heart size without evidence of acute cardiopulmonary disease. Electronically Signed   By: Margarette Canada M.D.   On: 11/01/2018 13:23   US Renal  Result Date: 11/01/2018 CLINICAL DATA:  Acute renal failure. EXAM: RENAL / URINARY TRACT ULTRASOUND COMPLETE COMPARISON:  None. FINDINGS: Right Kidney: Renal measurements: 11.6 x 7.6 x 7.1 cm = volume: 326 mL. Multiple cysts throughout the right kidney. No hydronephrosis. Left Kidney: Renal measurements: 13.3 x 6.5 x 5 cm = volume: 225 mL. Multiple cysts in the left kidney. No hydronephrosis. Bladder: No wall thickening or filling defects identified. IMPRESSION: Multiple bilateral renal cysts. 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 No hydronephrosis. Electronically Signed   By: Lucienne Capers M.D.   On: 11/01/2018 02:04   Nm Pulmonary Perf And Vent  Result Date: 11/01/2018 CLINICAL DATA:  67 year old female with elevated D-dimer and syncope. EXAM: NUCLEAR MEDICINE VENTILATION - PERFUSION LUNG SCAN TECHNIQUE: Ventilation images were obtained in multiple projections using inhaled aerosol Tc-72m DTPA. Perfusion images were obtained in multiple projections after intravenous injection of Tc-11m MAA. RADIOPHARMACEUTICALS:  31.8 mCi of Tc-37m DTPA aerosol inhalation and 4.4 mCi Tc47m MAA IV COMPARISON:  11/01/2018 chest radiograph FINDINGS: Ventilation: No focal ventilation defect. Perfusion: No wedge shaped peripheral perfusion defects to suggest acute pulmonary  embolism. IMPRESSION: Normal VQ scan. Electronically Signed   By: Margarette Canada M.D.   On: 11/01/2018 13:22    Lab Data:  CBC: Recent Labs  Lab 10/31/18 1249  10/31/18 1321 11/01/18 0516 11/02/18 0333 11/03/18 0430 11/04/18 0418  WBC 11.5*  --   --  12.5* 6.9 7.2 8.1  NEUTROABS 7.9*  --   --   --   --   --   --   HGB 11.5*   < > 14.3 14.1 13.3 14.1 15.1*  HCT 35.2*   < >  42.0 42.0 40.5 42.4 44.7  MCV 90.7  --   --  88.8 89.6 87.8 87.6  PLT 181  --   --  175 162 177 186   < > = values in this interval not displayed.   Basic Metabolic Panel: Recent Labs  Lab 10/31/18 1500 11/01/18 0516 11/02/18 0333 11/03/18 0430 11/04/18 0418  NA 132* 134* 137 135 135  K 3.4* 3.4* 3.2* 3.4* 3.5  CL 98 102 104 103 104  CO2 22 20* 26 23 23   GLUCOSE 207* 157* 132* 157* 136*  BUN 53* 45* 30* 20 17  CREATININE 3.64* 2.85* 2.10* 1.97* 1.85*  CALCIUM 11.1* 10.7* 11.1* 11.7* 11.7*   GFR: Estimated Creatinine Clearance: 30.4 mL/min (A) (by C-G formula based on SCr of 1.85 mg/dL (H)). Liver Function Tests: Recent Labs  Lab 10/31/18 1249 10/31/18 1500  AST 27 163*  ALT 20 92*  ALKPHOS 85 137*  BILITOT 0.8 1.5*  PROT 4.1* 6.6  ALBUMIN 2.3* 3.7   No results for input(s): LIPASE, AMYLASE in the last 168 hours. No results for input(s): AMMONIA in the last 168 hours. Coagulation Profile: Recent Labs  Lab 10/31/18 1249  INR 1.31   Cardiac Enzymes: Recent Labs  Lab 10/31/18 1249 10/31/18 1500 10/31/18 2052 11/01/18 0516  TROPONINI 0.31* 0.46* 0.45* 0.42*   BNP (last 3 results) No results for input(s): PROBNP in the last 8760 hours. HbA1C: No results for input(s): HGBA1C in the last 72 hours. CBG: Recent Labs  Lab 11/03/18 1149 11/03/18 1633 11/03/18 2220 11/04/18 0810 11/04/18 1106  GLUCAP 171* 109* 132* 161* 161*   Lipid Profile: No results for input(s): CHOL, HDL, LDLCALC, TRIG, CHOLHDL, LDLDIRECT in the last 72 hours. Thyroid Function Tests: Recent Labs     11/04/18 1003  TSH 1.475  FREET4 1.32   Anemia Panel: No results for input(s): VITAMINB12, FOLATE, FERRITIN, TIBC, IRON, RETICCTPCT in the last 72 hours. Urine analysis:    Component Value Date/Time   COLORURINE YELLOW 10/31/2018 1751   APPEARANCEUR CLOUDY (A) 10/31/2018 1751   LABSPEC 1.026 10/31/2018 1751   PHURINE 5.0 10/31/2018 1751   GLUCOSEU NEGATIVE 10/31/2018 1751   HGBUR SMALL (A) 10/31/2018 1751   BILIRUBINUR NEGATIVE 10/31/2018 1751   KETONESUR NEGATIVE 10/31/2018 1751   PROTEINUR 30 (A) 10/31/2018 1751   NITRITE NEGATIVE 10/31/2018 1751   LEUKOCYTESUR LARGE (A) 10/31/2018 1751     Melissa T. [facility] Pager [phone]  If 7PM-7AM, please contact night-coverage [email or web address] Password [facility] 11/04/2018, 3:57 PM

## 2018-11-04 NOTE — Progress Notes (Signed)
Progress Note  Patient Name: Melissa Lopez Date of Encounter: 11/04/2018  Primary Cardiologist:   No primary care provider on file.   Subjective   No chest pain.  Less dizzy and less nausea.  She has some pain on the bottom of her feet.   Inpatient Medications    Scheduled Meds: . amLODipine  5 mg Oral Daily  . aspirin  81 mg Oral Daily  . Influenza vac split quadrivalent PF  0.5 mL Intramuscular Tomorrow-1000  . insulin aspart  0-20 Units Subcutaneous TID WC  . insulin aspart  0-5 Units Subcutaneous QHS  . insulin aspart protamine- aspart  12 Units Subcutaneous BID WC  . metoCLOPramide (REGLAN) injection  5 mg Intravenous Q6H  . pantoprazole (PROTONIX) IV  40 mg Intravenous Q24H  . pneumococcal 23 valent vaccine  0.5 mL Intramuscular Tomorrow-1000  . sodium chloride flush  3 mL Intravenous Q12H   Continuous Infusions: . sodium chloride    . sodium chloride 75 mL/hr at 11/04/18 0010  . cefTRIAXone (ROCEPHIN)  IV 1 g (11/03/18 1056)   PRN Meds: sodium chloride, albuterol, bisacodyl, morphine injection, ondansetron (ZOFRAN) IV, sodium chloride flush   Vital Signs    Vitals:   11/03/18 1905 11/03/18 2239 11/04/18 0313 11/04/18 0314  BP: (!) 158/84 (!) 169/92  (!) 177/86  Pulse: 68 60  63  Resp: 16 17  14   Temp: 98 F (36.7 C) 98.1 F (36.7 C)  98.2 F (36.8 C)  TempSrc: Oral Oral  Oral  SpO2:  96%  100%  Weight:   84.3 kg   Height:        Intake/Output Summary (Last 24 hours) at 11/04/2018 0824 Last data filed at 11/04/2018 0400 Gross per 24 hour  Intake 1590.46 ml  Output 900 ml  Net 690.46 ml   Filed Weights   11/02/18 0607 11/03/18 0539 11/04/18 0313  Weight: 85.9 kg 86.1 kg 84.3 kg    Telemetry    NSR, atrial pacing demand - Personally Reviewed  ECG    NA - Personally Reviewed  Physical Exam   GEN: No  acute distress.   Neck: No  JVD Cardiac: RRR, no murmurs, rubs, or gallops.  Respiratory: Clear  to auscultation bilaterally. GI:  Soft, nontender, non-distended, normal bowel sounds  MS:  No edema; No deformity. Neuro:   Nonfocal  Psych: Oriented and appropriate   Labs    Chemistry Recent Labs  Lab 10/31/18 1249  10/31/18 1500  11/02/18 0333 11/03/18 0430 11/04/18 0418  NA 139   < > 132*   < > 137 135 135  K 2.2*   < > 3.4*   < > 3.2* 3.4* 3.5  CL 114*   < > 98   < > 104 103 104  CO2 18*  --  22   < > 26 23 23   GLUCOSE 132*   < > 207*   < > 132* 157* 136*  BUN 38*   < > 53*   < > 30* 20 17  CREATININE 2.71*   < > 3.64*   < > 2.10* 1.97* 1.85*  CALCIUM 7.3*  --  11.1*   < > 11.1* 11.7* 11.7*  PROT 4.1*  --  6.6  --   --   --   --   ALBUMIN 2.3*  --  3.7  --   --   --   --   AST 27  --  163*  --   --   --   --  ALT 20  --  92*  --   --   --   --   ALKPHOS 85  --  137*  --   --   --   --   BILITOT 0.8  --  1.5*  --   --   --   --   GFRNONAA 17*  --  12*   < > 23* 25* 28*  GFRAA 20*  --  14*   < > 27* 29* 32*  ANIONGAP 7  --  12   < > 7 9 8    < > = values in this interval not displayed.     Hematology Recent Labs  Lab 11/02/18 0333 11/03/18 0430 11/04/18 0418  WBC 6.9 7.2 8.1  RBC 4.52 4.83 5.10  HGB 13.3 14.1 15.1*  HCT 40.5 42.4 44.7  MCV 89.6 87.8 87.6  MCH 29.4 29.2 29.6  MCHC 32.8 33.3 33.8  RDW 12.1 11.9 12.1  PLT 162 177 186    Cardiac Enzymes Recent Labs  Lab 10/31/18 1249 10/31/18 1500 10/31/18 2052 11/01/18 0516  TROPONINI 0.31* 0.46* 0.45* 0.42*    Recent Labs  Lab 10/31/18 1253  TROPIPOC 0.33*     BNPNo results for input(s): BNP, PROBNP in the last 168 hours.   DDimer  Recent Labs  Lab 10/31/18 1526  DDIMER 0.82*     Radiology    No results found.  Cardiac Studies   ECHO:   Study Conclusions  - Left ventricle: The cavity size was normal. Wall thickness was   increased in a pattern of severe LVH. Systolic function was   normal. The estimated ejection fraction was in the range of 55%   to 60%. There is hypokinesis of the apical myocardium. Doppler    parameters are consistent with abnormal left ventricular   relaxation (grade 1 diastolic dysfunction). - Pericardium, extracardiac: A trivial pericardial effusion was   identified.  Impressions:  - Definity used; apical hypokinesis with overall normal LV systolic   function; mild diastolic dysfunction; severe LVH more prominent   at apex; consider apical variant hypertrophic cardiomyopathy or   infiltrative cardiomyopathy.  CATH Diagnostic Diagram        Patient Profile     67 y.o. female with past medical history of pacemaker, diabetes mellitus, chronic stage III kidney disease, hypertension admitted with chest pain and syncope.  Cardiac catheterization revealed 70% ostial ramus relatively small in caliber, 60% ostial diagonal, and very small occluded first marginal.  There was no likely culprit for angina.  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   Assessment & Plan    ELEVATED TROPONIN:  Branch vessel disease.  Medical therapy.  CKD III:   Hydrated, continuing to hold ACE inhibitor.    DM:   Per primary team.    DIZZY:   Working with PT.  Improved  HTN:  Norvasc started yesterday.  Holding ACE inhibitor.  I will restart hydralazine 25 mg po TID.   Not sure what she is keeping down.   Improved  PACEMAKER:  OK to discontinue tele.   N/V:  Thought to be gastroparesis.    Per primary team.  GI consulted.   HYPOKALEMIA:  Supplemented again yesterday.  I will begin daily potassium.   LEUKOCYTOSIS:  Completed atb yesterday.      For questions or updates,  please contact [place] Please consult [email or web address] for contact info under Cardiology/STEMI.   Signed, Minus Breeding, MD  11/04/2018, 8:24 AM

## 2018-11-05 DIAGNOSIS — N179 Acute kidney failure, unspecified: Secondary | ICD-10-CM

## 2018-11-05 DIAGNOSIS — I2699 Other pulmonary embolism without acute cor pulmonale: Secondary | ICD-10-CM

## 2018-11-05 LAB — COMPREHENSIVE METABOLIC PANEL
ALBUMIN: 3.5 g/dL (ref 3.5–5.0)
ALT: 51 U/L — ABNORMAL HIGH (ref 0–44)
AST: 27 U/L (ref 15–41)
Alkaline Phosphatase: 116 U/L (ref 38–126)
Anion gap: 6 (ref 5–15)
BUN: 16 mg/dL (ref 8–23)
CHLORIDE: 106 mmol/L (ref 98–111)
CO2: 23 mmol/L (ref 22–32)
Calcium: 11.8 mg/dL — ABNORMAL HIGH (ref 8.9–10.3)
Creatinine, Ser: 1.85 mg/dL — ABNORMAL HIGH (ref 0.44–1.00)
GFR calc Af Amer: 32 mL/min — ABNORMAL LOW (ref >60)
GFR calc non Af Amer: 28 mL/min — ABNORMAL LOW (ref >60)
GLUCOSE: 106 mg/dL — AB (ref 70–99)
POTASSIUM: 3.9 mmol/L (ref 3.5–5.1)
SODIUM: 135 mmol/L (ref 135–145)
TOTAL PROTEIN: 6.6 g/dL (ref 6.5–8.1)
Total Bilirubin: 0.5 mg/dL (ref 0.3–1.2)

## 2018-11-05 LAB — PTH, INTACT AND CALCIUM
Calcium, Total (PTH): 11.7 mg/dL — ABNORMAL HIGH (ref 8.7–10.3)
PTH: 109 pg/mL — AB (ref 15–65)

## 2018-11-05 LAB — GLUCOSE, CAPILLARY
GLUCOSE-CAPILLARY: 122 mg/dL — AB (ref 70–99)
GLUCOSE-CAPILLARY: 129 mg/dL — AB (ref 70–99)
Glucose-Capillary: 131 mg/dL — ABNORMAL HIGH (ref 70–99)
Glucose-Capillary: 117 mg/dL — ABNORMAL HIGH (ref 70–99)

## 2018-11-05 LAB — TROPONIN I
TROPONIN I: 0.05 ng/mL — AB (ref <0.03)
TROPONIN I: 0.06 ng/mL — AB (ref <0.03)
Troponin I: 0.06 ng/mL (ref <0.03)

## 2018-11-05 LAB — VITAMIN D 25 HYDROXY (VIT D DEFICIENCY, FRACTURES): VIT D 25 HYDROXY: 16.5 ng/mL — AB (ref 30.0–100.0)

## 2018-11-05 MED ORDER — CARVEDILOL 12.5 MG PO TABS
12.5000 mg | ORAL_TABLET | Freq: Two times a day (BID) | ORAL | Status: DC
  Administered 2018-11-06 – 2018-11-12 (×13): 12.5 mg via ORAL
  Filled 2018-11-05 (×13): qty 1

## 2018-11-05 MED ORDER — HEPARIN (PORCINE) 25000 UT/250ML-% IV SOLN
1150.0000 [IU]/h | INTRAVENOUS | Status: DC
  Administered 2018-11-05: 1150 [IU]/h via INTRAVENOUS
  Filled 2018-11-05: qty 250

## 2018-11-05 MED ORDER — HEPARIN SODIUM (PORCINE) 5000 UNIT/ML IJ SOLN
5000.0000 [IU] | Freq: Three times a day (TID) | INTRAMUSCULAR | Status: DC
  Administered 2018-11-05 – 2018-11-11 (×18): 5000 [IU] via SUBCUTANEOUS
  Filled 2018-11-05 (×19): qty 1

## 2018-11-05 MED ORDER — HEPARIN BOLUS VIA INFUSION
4000.0000 [IU] | Freq: Once | INTRAVENOUS | Status: AC
  Administered 2018-11-05: 4000 [IU] via INTRAVENOUS
  Filled 2018-11-05: qty 4000

## 2018-11-05 NOTE — Progress Notes (Signed)
PROGRESS NOTE  Melissa Lopez FIE:[id number] DOB: 12-19-1950 DOA: 10/31/2018 PCP: No primary care provider on file.  HPI/Recap of past 24 hours: Melissa Honeycuttis an 67 y.o.femalewith history of type 2 diabetes mellitus with diabetic neuropathy, chronic kidney disease? Stage II, hypertension, CAD, arthritis requiring bilateral knee replacement presented as code STEMI after a near syncopal event at home. Patient admitted to cardiology service and underwent emergent cardiac cath and concern for EKG changes and abnormal troponin. Cardiac cath was unremarkable and did not explain her symptoms. Medicine service consulted for further evaluation.Patient has had dizzy spells, described as lightheaded feeling, for a long time and mostly on walking. She has been walker dependent at baseline due to bilateral arthritis and ataxia/periodic dizziness. The last episode was also associated with some chest discomfort. 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 She takes PPI at baseline.She has chronic exertional dyspnea with no new changes.Labs on presentation showed potassium of 2.2, creatinine of 2.7 (repeat level at 3.8), troponin of 0.33 and EKG showedwide-complex paced rhythm with ST changes.  11/05/2018: Patient seen and examined at bedside with family members in the room.  She had reported chest pain.  Twelve-lead EKG unremarkable.  Troponin unremarkable.  Cardiology and GI following.  Assessment/Plan: Principal Problem:   Chest pain with high risk of acute coronary syndrome Active Problems:   H/O cardiac pacemaker   Syncope   ST elevation - with non-obstructive CAD   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HCC)   Type 2 diabetes mellitus with complication, with long-term current use of insulin (HCC)   CKD (chronic kidney disease) stage 3, GFR 30-59 ml/min ([place]) - per patient  report   Essential hypertension   Chest pain: LHC on 10/31/2018 with mild diffuse disease.   -We will resume beta-blocker as able. -Troponin trending down peaked at 0.46 -Cardiology following.  No need to start IV heparin.  Syncope/near syncope, falls: Orthostatic vitals, VQ scan, cardiac cath and cortisol level not impressive.  Patient has a pacemaker placed in [place].  Concern for right BPPV per PT. Also concern for autonomic dysfunction in the setting of diabetes although this seems to be fairly controlled. -May need outpatient vestibular rehab for BPPV  Nausea and vomiting: Improved.  Tolerating clear liquids.  Differential diagnosis include gastroparesis and cannabinoid hyperemesis -GI on board (planning gastric emptying scan). However, patient is still on Reglan.  -Continue PPI. -Advance to full liquid. -Obtain gastric emptying scan prior to discharge  Diastolic CHF: Echocardiogram on 10/31/2018 with EF of 50 to 60%,, severe LVH, hypokinesis of apical myocardium and G1DD.  Currently without cardiopulmonary symptoms.  Appears euvolemic. -Continue daily weight, intake output and renal function.  Type 2 diabetes mellitus, IDDM : A1c 7.0% -Continue CBG monitoring -Continue NovoLog 70/30 20 units twice a day  Mild leukocytosis, UTI: Leukocytosis resolved.  Patient without urinary symptoms.  Urine cultures negative. -Discontinue ceftriaxone 11/23-11/27  Acute on CKD (chronic kidney disease) stage 3-4:  -Improving -Appears to be at her baseline creatinine of 1.8 and GFR of 28  -Presented with creatinine of 2.85 -Avoid nephrotoxic agents/dehydration/hypotension -Repeat BMP in the morning  Essential hypertension: SBP slightly elevated. -Amlodipine dose increased to 10 mg daily -Continue Coreg 12.5 mg twice daily  Hypercalcemia:  -Check PTH, PTHrp, thyroid functions and vit D level.  Code Status: Full code DVT Prophylaxis:   Subcu heparin 3 times daily Family  Communication:  Daughter at bedside all questions  answered to her satisfaction.  Disposition Plan:   Discharge when cardiology/GI signed off.  Procedures:  Cardiac cath  LV end diastolic pressure is mildly elevated.  Ost Ramus lesion is 70% stenosed -relatively small caliber vessel. Not likely because of resting chest pain and ST elevations.  Ost 1st Diag lesion is 60% stenosed. -Not likely flow-limiting.  Very small caliber (<57mm) 1st Mrg lesion appears to be 100% stenosed with faint collateralization. Too small for angioplasty or stenting.     Objective: Vitals:   11/04/18 2046 11/05/18 0556 11/05/18 1235 11/05/18 1403  BP: (!) 152/91 (!) 185/97 (!) 143/97 (!) 181/101  Pulse: 62   60  Resp: 18   13  Temp: 98.5 F (36.9 C)   98.4 F (36.9 C)  TempSrc: Oral   Oral  SpO2: 99%   98%  Weight:      Height:        Intake/Output Summary (Last 24 hours) at 11/05/2018 1804 Last data filed at 11/05/2018 1640 Gross per 24 hour  Intake 338.54 ml  Output -  Net 338.54 ml   Filed Weights   11/02/18 0607 11/03/18 0539 11/04/18 0313  Weight: 85.9 kg 86.1 kg 84.3 kg    Exam:  . General: 67 y.o. year-old female well developed well nourished in no acute distress.  Alert and oriented x3. . Cardiovascular: Regular rate and rhythm with no rubs or gallops.  No thyromegaly or JVD noted.   Marland Kitchen Respiratory: Clear to auscultation with no wheezes or rales. Good inspiratory effort. . Abdomen: Soft nontender nondistended with normal bowel sounds x4 quadrants. . Musculoskeletal: No lower extremity edema. 2/4 pulses in all 4 extremities. . Skin: No ulcerative lesions noted or rashes, . Psychiatry: Mood is appropriate for condition and setting   Data Reviewed: CBC: Recent Labs  Lab 10/31/18 1249  10/31/18 1321 11/01/18 0516 11/02/18 0333 11/03/18 0430 11/04/18 0418  WBC 11.5*  --   --  12.5* 6.9 7.2 8.1  NEUTROABS 7.9*  --   --   --   --   --   --   HGB 11.5*   < > 14.3 14.1 13.3  14.1 15.1*  HCT 35.2*   < > 42.0 42.0 40.5 42.4 44.7  MCV 90.7  --   --  88.8 89.6 87.8 87.6  PLT 181  --   --  175 162 177 186   < > = values in this interval not displayed.   Basic Metabolic Panel: Recent Labs  Lab 11/01/18 0516 11/02/18 0333 11/03/18 0430 11/04/18 0418 11/04/18 1003 11/05/18 0343  NA 134* 137 135 135  --  135  K 3.4* 3.2* 3.4* 3.5  --  3.9  CL 102 104 103 104  --  106  CO2 20* 26 23 23   --  23  GLUCOSE 157* 132* 157* 136*  --  106*  BUN 45* 30* 20 17  --  16  CREATININE 2.85* 2.10* 1.97* 1.85*  --  1.85*  CALCIUM 10.7* 11.1* 11.7* 11.7* 11.7* 11.8*   GFR: Estimated Creatinine Clearance: 30.4 mL/min (A) (by C-G formula based on SCr of 1.85 mg/dL (H)). Liver Function Tests: Recent Labs  Lab 10/31/18 1249 10/31/18 1500 11/05/18 0343  AST 27 163* 27  ALT 20 92* 51*  ALKPHOS 85 137* 116  BILITOT 0.8 1.5* 0.5  PROT 4.1* 6.6 6.6  ALBUMIN 2.3* 3.7 3.5   No results for input(s): LIPASE, AMYLASE in the last 168 hours. No results for input(s): AMMONIA  in the last 168 hours. Coagulation Profile: Recent Labs  Lab 10/31/18 1249  INR 1.31   Cardiac Enzymes: Recent Labs  Lab 10/31/18 1500 10/31/18 2052 11/01/18 0516 11/05/18 1031 11/05/18 1525  TROPONINI 0.46* 0.45* 0.42* 0.06* 0.05*   BNP (last 3 results) No results for input(s): PROBNP in the last 8760 hours. HbA1C: No results for input(s): HGBA1C in the last 72 hours. CBG: Recent Labs  Lab 11/04/18 1601 11/04/18 2212 11/05/18 0741 11/05/18 1130 11/05/18 1626  GLUCAP 117* 146* 131* 122* 129*   Lipid Profile: No results for input(s): CHOL, HDL, LDLCALC, TRIG, CHOLHDL, LDLDIRECT in the last 72 hours. Thyroid Function Tests: Recent Labs    11/04/18 1003  TSH 1.475  FREET4 1.32   Anemia Panel: No results for input(s): VITAMINB12, FOLATE, FERRITIN, TIBC, IRON, RETICCTPCT in the last 72 hours. Urine analysis:    Component Value Date/Time   COLORURINE YELLOW 10/31/2018 1751    APPEARANCEUR CLOUDY (A) 10/31/2018 1751   LABSPEC 1.026 10/31/2018 1751   PHURINE 5.0 10/31/2018 1751   GLUCOSEU NEGATIVE 10/31/2018 1751   HGBUR SMALL (A) 10/31/2018 1751   BILIRUBINUR NEGATIVE 10/31/2018 1751   KETONESUR NEGATIVE 10/31/2018 1751   PROTEINUR 30 (A) 10/31/2018 1751   NITRITE NEGATIVE 10/31/2018 1751   LEUKOCYTESUR LARGE (A) 10/31/2018 1751   Sepsis Labs: @LABRCNTIP (procalcitonin:4,lacticidven:4)  ) Recent Results (from the past 240 hour(s))  MRSA PCR Screening     Status: None   Collection Time: 10/31/18  2:24 PM  Result Value Ref Range Status   MRSA by PCR NEGATIVE NEGATIVE Final    Comment:        The GeneXpert MRSA Assay (FDA approved for NASAL specimens only), is one component of a comprehensive MRSA colonization surveillance program. It is not intended to diagnose MRSA infection nor to guide or monitor treatment for MRSA infections. Performed at [facility], [place] [address], [place], [place] [address]   Urine Culture     Status: None   Collection Time: 11/02/18  3:21 PM  Result Value Ref Range Status   Specimen Description URINE, RANDOM  Final   Special Requests NONE  Final   Culture   Final    NO GROWTH Performed at [facility], [place] [address] [place], [place] [address]    Report Status 11/03/2018 FINAL  Final      Studies: No results found.  Scheduled Meds: . amLODipine  10 mg Oral Daily  . aspirin  81 mg Oral Daily  . carvedilol  12.5 mg Oral BID WC  . hydrALAZINE  25 mg Oral Q8H  . Influenza vac split quadrivalent PF  0.5 mL Intramuscular Tomorrow-1000  . insulin aspart  0-20 Units Subcutaneous TID WC  . insulin aspart  0-5 Units Subcutaneous QHS  . insulin aspart protamine- aspart  12 Units Subcutaneous BID WC  . metoCLOPramide (REGLAN) injection  5 mg Intravenous Q6H  . pantoprazole (PROTONIX) IV  40 mg Intravenous Q24H  . pneumococcal 23 valent vaccine  0.5 mL Intramuscular Tomorrow-1000  . polyethylene glycol  17  g Oral TID  . potassium chloride  40 mEq Oral Daily  . sodium chloride flush  3 mL Intravenous Q12H    Continuous Infusions: . sodium chloride 10 mL/hr at 11/04/18 1542  . heparin 1,150 Units/hr (11/05/18 1640)     LOS: 5 days     Kayleen Memos, MD [facility] Pager [phone]  If 7PM-7AM, please contact night-coverage [email or web address] Password [facility] 11/05/2018, 6:04 PM

## 2018-11-05 NOTE — Progress Notes (Signed)
[place] for Heparin Indication:   Allergies  Allergen Reactions  . Percocet [Oxycodone-Acetaminophen]     Patient Measurements: Height: 5\' 3"  (160 cm) Weight: 185 lb 14.4 oz (84.3 kg) IBW/kg (Calculated) : 52.4 Heparin Dosing Weight: 84kg  Vital Signs: BP: 185/97 (11/28 0556)  Labs: Recent Labs    11/03/18 0430 11/04/18 0418 11/05/18 0343  HGB 14.1 15.1*  --   HCT 42.4 44.7  --   PLT 177 186  --   CREATININE 1.97* 1.85* 1.85*    Estimated Creatinine Clearance: 30.4 mL/min (A) (by C-G formula based on SCr of 1.85 mg/dL (H)).   Medical History: Past Medical History:  Diagnosis Date  . CKD (chronic kidney disease) stage 3, GFR 30-59 ml/min (HCC) 10/31/2018   - per patient report  . Coronary artery disease    per patient report - 2 caths with non-obstructive CAD  . Essential hypertension   . H/O cardiac pacemaker 10/31/2018  . Type 2 diabetes mellitus with complication, with long-term current use of insulin ([place]) 10/31/2018   with diabetic nephropathy   Assessment: 67 year old female admitted with chest pain, syncope, and possible stemi. Taken urgently to lab 11/23. No CAD was seen, possible occlusion of small branch but  too small to stent.  Heparin was continued for possible PE but VQ was negative.  She is noted with CP to restart heparin  -last heparin rate was 1150 units/hr on 11/24 with heparin level= 0.39  Goal of Therapy:  Heparin level 0.3-0.7 units/ml Monitor platelets by anticoagulation protocol: Yes   Plan:  -heparin 4000 units bolus followed by 1150 units/hr -Heparin level in 6 hours and daily wth CBC daily  Hildred Laser, PharmD Clinical Pharmacist **Pharmacist phone directory can now be found on [email or web address] ([email or web address]).  Listed under [place].

## 2018-11-05 NOTE — Progress Notes (Signed)
Troponin trending down. Will not start heparin IV Sanda Klein, MD, [facility] [phone] office [phone] pager'

## 2018-11-05 NOTE — Progress Notes (Signed)
Patient developed chest pain earlier today, prompting cardiology evaluation which, fortunately, appears to be evolving in a favorable direction with decreasing troponin levels and consequently a decision to hold off on heparin infusion.  The patient's friend at the bedside does most of the talking, and patient himself is relatively noncommunicative.  Apparently, the patient did fairly well the past 2 days with food but then today he started having dry heaves again, no emesis of actual food.  Exam: Overweight female, rather quiet, no acute distress.  Abdomen is obese, sparse bowel sounds, no succussion splash, no mass-effect or tenderness.  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  Differential diagnosis as previously discussed by Dr. Therisa Doyne.  Wonder about primary gastric motility disorder.  Plan: With the patient's agreement, we will give a trial of a progressive gastroparesis diet, starting out with frequent small aliquots of clear liquids, 15 mL's every 30 minutes while awake.  I will reassess tomorrow and advance if appropriate.  Cleotis Nipper, M.D. Pager [phone] If no answer or after 5 PM call [phone]

## 2018-11-05 NOTE — Progress Notes (Addendum)
Progress Note  Patient Name: Melissa Lopez Date of Encounter: 11/05/2018  Primary Cardiologist:   No primary care provider on file.   Subjective   Chest pain.  Substernal and sharp  Inpatient Medications    Scheduled Meds: . amLODipine  10 mg Oral Daily  . aspirin  81 mg Oral Daily  . carvedilol  6.25 mg Oral BID WC  . hydrALAZINE  25 mg Oral Q8H  . Influenza vac split quadrivalent PF  0.5 mL Intramuscular Tomorrow-1000  . insulin aspart  0-20 Units Subcutaneous TID WC  . insulin aspart  0-5 Units Subcutaneous QHS  . insulin aspart protamine- aspart  12 Units Subcutaneous BID WC  . metoCLOPramide (REGLAN) injection  5 mg Intravenous Q6H  . pantoprazole (PROTONIX) IV  40 mg Intravenous Q24H  . pneumococcal 23 valent vaccine  0.5 mL Intramuscular Tomorrow-1000  . polyethylene glycol  17 g Oral TID  . potassium chloride  40 mEq Oral Daily  . sodium chloride flush  3 mL Intravenous Q12H   Continuous Infusions: . sodium chloride 10 mL/hr at 11/04/18 1542   PRN Meds: sodium chloride, albuterol, bisacodyl, morphine injection, ondansetron (ZOFRAN) IV, sodium chloride flush   Vital Signs    Vitals:   11/04/18 1543 11/04/18 1743 11/04/18 2046 11/05/18 0556  BP: (!) 172/96 (!) 177/110 (!) 152/91 (!) 185/97  Pulse:  88 62   Resp:   18   Temp:   98.5 F (36.9 C)   TempSrc:   Oral   SpO2:   99%   Weight:      Height:        Intake/Output Summary (Last 24 hours) at 11/05/2018 0927 Last data filed at 11/05/2018 0831 Gross per 24 hour  Intake 1089.99 ml  Output -  Net 1089.99 ml   Filed Weights   11/02/18 0607 11/03/18 0539 11/04/18 0313  Weight: 85.9 kg 86.1 kg 84.3 kg    Telemetry    NSR, intermittent pacing - Personally Reviewed  ECG    Atrial paced, intermittent ventricular pacing.  Anterolateral ST elevation and T wave inversion in non paced beats.  Possible acute injury pattern - Personally Reviewed  Physical Exam   GEN: No  acute distress.     Neck: No  JVD Cardiac: RRR, no murmurs, rubs, or gallops.  Respiratory: Clear   to auscultation bilaterally. GI: Soft, nontender, non-distended, normal bowel sounds  MS:  No edema; No deformity. Neuro:   Nonfocal  Psych: Oriented and appropriate   Labs    Chemistry Recent Labs  Lab 10/31/18 1249  10/31/18 1500  11/03/18 0430 11/04/18 0418 11/04/18 1003 11/05/18 0343  NA 139   < > 132*   < > 135 135  --  135  K 2.2*   < > 3.4*   < > 3.4* 3.5  --  3.9  CL 114*   < > 98   < > 103 104  --  106  CO2 18*  --  22   < > 23 23  --  23  GLUCOSE 132*   < > 207*   < > 157* 136*  --  106*  BUN 38*   < > 53*   < > 20 17  --  16  CREATININE 2.71*   < > 3.64*   < > 1.97* 1.85*  --  1.85*  CALCIUM 7.3*  --  11.1*   < > 11.7* 11.7* 11.7* 11.8*  PROT 4.1*  --  6.6  --   --   --   --  6.6  ALBUMIN 2.3*  --  3.7  --   --   --   --  3.5  AST 27  --  163*  --   --   --   --  27  ALT 20  --  92*  --   --   --   --  51*  ALKPHOS 85  --  137*  --   --   --   --  116  BILITOT 0.8  --  1.5*  --   --   --   --  0.5  GFRNONAA 17*  --  12*   < > 25* 28*  --  28*  GFRAA 20*  --  14*   < > 29* 32*  --  32*  ANIONGAP 7  --  12   < > 9 8  --  6   < > = values in this interval not displayed.     Hematology Recent Labs  Lab 11/02/18 0333 11/03/18 0430 11/04/18 0418  WBC 6.9 7.2 8.1  RBC 4.52 4.83 5.10  HGB 13.3 14.1 15.1*  HCT 40.5 42.4 44.7  MCV 89.6 87.8 87.6  MCH 29.4 29.2 29.6  MCHC 32.8 33.3 33.8  RDW 12.1 11.9 12.1  PLT 162 177 186    Cardiac Enzymes Recent Labs  Lab 10/31/18 1249 10/31/18 1500 10/31/18 2052 11/01/18 0516  TROPONINI 0.31* 0.46* 0.45* 0.42*    Recent Labs  Lab 10/31/18 1253  TROPIPOC 0.33*     BNPNo results for input(s): BNP, PROBNP in the last 168 hours.   DDimer  Recent Labs  Lab 10/31/18 1526  DDIMER 0.82*     Radiology    No results found.  Cardiac Studies   ECHO:   Study Conclusions  - Left ventricle: The cavity size was normal. Wall  thickness was   increased in a pattern of severe LVH. Systolic function was   normal. The estimated ejection fraction was in the range of 55%   to 60%. There is hypokinesis of the apical myocardium. Doppler   parameters are consistent with abnormal left ventricular   relaxation (grade 1 diastolic dysfunction). - Pericardium, extracardiac: A trivial pericardial effusion was   identified.  Impressions:  - Definity used; apical hypokinesis with overall normal LV systolic   function; mild diastolic dysfunction; severe LVH more prominent   at apex; consider apical variant hypertrophic cardiomyopathy or   infiltrative cardiomyopathy.  CATH Diagnostic Diagram        Patient Profile     66 y.o. female with past medical history of pacemaker, diabetes mellitus, chronic stage III kidney disease, hypertension admitted with chest pain and syncope.  Cardiac catheterization revealed 70% ostial ramus relatively small in caliber, 60% ostial diagonal, and very small occluded first marginal.  There was no likely culprit for angina.  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   Assessment & Plan    CHEST PAIN:  Branch vessel disease.  She is having active pain.  Her EKG is markedly abnormal as above but this seems to be an evolution from the initial EKG.  I am going to restart heparin and tele and cycle enzymes.  However, if the enzymes are negative, given the baseline abnormal EKG before today and the atypical pain and the cath a few days ago, repeat cath  would not be indicated. .  CKD III:   Creat is unchanged from previous  DM:   Per primary team.    HTN:  Norvasc started.  Holding ACE inhibitor.  hydralazine 25 mg po TID.   Increase beta blocker today  PACEMAKER:  Tele  restarted  N/V:  Thought to be gastroparesis.    GI is involved.   HYPOKALEMIA:   Potassium supplemented.      For questions or updates, please contact [place] Please consult [email or web address] for contact info under Cardiology/STEMI.   Signed, Minus Breeding, MD  11/05/2018, 9:27 AM

## 2018-11-06 LAB — BASIC METABOLIC PANEL
ANION GAP: 8 (ref 5–15)
BUN: 18 mg/dL (ref 8–23)
CO2: 22 mmol/L (ref 22–32)
Calcium: 12.1 mg/dL — ABNORMAL HIGH (ref 8.9–10.3)
Chloride: 104 mmol/L (ref 98–111)
Creatinine, Ser: 1.96 mg/dL — ABNORMAL HIGH (ref 0.44–1.00)
GFR calc Af Amer: 30 mL/min — ABNORMAL LOW (ref >60)
GFR calc non Af Amer: 26 mL/min — ABNORMAL LOW (ref >60)
Glucose, Bld: 153 mg/dL — ABNORMAL HIGH (ref 70–99)
Potassium: 3.9 mmol/L (ref 3.5–5.1)
Sodium: 134 mmol/L — ABNORMAL LOW (ref 135–145)

## 2018-11-06 LAB — CBC
HCT: 45.7 % (ref 36.0–46.0)
HEMOGLOBIN: 15 g/dL (ref 12.0–15.0)
MCH: 28.8 pg (ref 26.0–34.0)
MCHC: 32.8 g/dL (ref 30.0–36.0)
MCV: 87.9 fL (ref 80.0–100.0)
NRBC: 0 % (ref 0.0–0.2)
PLATELETS: 218 10*3/uL (ref 150–400)
RBC: 5.2 MIL/uL — AB (ref 3.87–5.11)
RDW: 11.9 % (ref 11.5–15.5)
WBC: 10.1 10*3/uL (ref 4.0–10.5)

## 2018-11-06 LAB — GLUCOSE, CAPILLARY
Glucose-Capillary: 154 mg/dL — ABNORMAL HIGH (ref 70–99)
Glucose-Capillary: 147 mg/dL — ABNORMAL HIGH (ref 70–99)
Glucose-Capillary: 117 mg/dL — ABNORMAL HIGH (ref 70–99)
Glucose-Capillary: 196 mg/dL — ABNORMAL HIGH (ref 70–99)

## 2018-11-06 MED ORDER — MINERAL OIL RE ENEM
1.0000 | ENEMA | Freq: Once | RECTAL | Status: AC
  Administered 2018-11-06: 1 via RECTAL
  Filled 2018-11-06 (×2): qty 1

## 2018-11-06 NOTE — Progress Notes (Signed)
Doing much better.  No nausea and no vomiting for approximately 20 hours.  Has been tolerating ad lib. clear liquid diet.  Interestingly, when she tried the frequent small aliquots of clear liquids at my suggestion, she did not tolerate that.  It basically seems like she turned the corner overnight, on her own.  Recommendation: Trial of soft diet tomorrow morning if she does well overnight tonight.  Cleotis Nipper, M.D. Pager [phone] If no answer or after 5 PM call [phone]

## 2018-11-06 NOTE — Progress Notes (Signed)
Progress Note  Patient Name: Melissa Lopez Date of Encounter: 11/06/2018  Primary Cardiologist:   No primary care provider on file.   Subjective   No further chest pain this morning.  Still nausea and vomiting.    Inpatient Medications    Scheduled Meds: . amLODipine  10 mg Oral Daily  . aspirin  81 mg Oral Daily  . carvedilol  12.5 mg Oral BID WC  . heparin injection (subcutaneous)  5,000 Units Subcutaneous Q8H  . hydrALAZINE  25 mg Oral Q8H  . Influenza vac split quadrivalent PF  0.5 mL Intramuscular Tomorrow-1000  . insulin aspart  0-20 Units Subcutaneous TID WC  . insulin aspart  0-5 Units Subcutaneous QHS  . insulin aspart protamine- aspart  12 Units Subcutaneous BID WC  . metoCLOPramide (REGLAN) injection  5 mg Intravenous Q6H  . pantoprazole (PROTONIX) IV  40 mg Intravenous Q24H  . pneumococcal 23 valent vaccine  0.5 mL Intramuscular Tomorrow-1000  . polyethylene glycol  17 g Oral TID  . potassium chloride  40 mEq Oral Daily  . sodium chloride flush  3 mL Intravenous Q12H   Continuous Infusions: . sodium chloride 10 mL/hr at 11/06/18 0300   PRN Meds: sodium chloride, albuterol, bisacodyl, morphine injection, ondansetron (ZOFRAN) IV, sodium chloride flush   Vital Signs    Vitals:   11/05/18 1235 11/05/18 1403 11/05/18 2110 11/06/18 0555  BP: (!) 143/97 (!) 181/101 (!) 175/97 (!) 168/99  Pulse:  60  73  Resp:  13  14  Temp:  98.4 F (36.9 C) 98.4 F (36.9 C) 98.2 F (36.8 C)  TempSrc:  Oral Oral Oral  SpO2:  98% 99% 96%  Weight:    83.7 kg  Height:        Intake/Output Summary (Last 24 hours) at 11/06/2018 0750 Last data filed at 11/06/2018 0300 Gross per 24 hour  Intake 803.55 ml  Output 1000 ml  Net -196.45 ml   Filed Weights   11/03/18 0539 11/04/18 0313 11/06/18 0555  Weight: 86.1 kg 84.3 kg 83.7 kg    Telemetry    NSR - Personally Reviewed  ECG    NA  Physical Exam   GEN: No  acute distress.   Neck: No  JVD Cardiac: RRR,  no murmurs, rubs, or gallops.  Respiratory: Clear   to auscultation bilaterally. GI: Soft, nontender, non-distended, normal bowel sounds  MS:  No edema; No deformity. Neuro:   Nonfocal  Psych: Oriented and appropriate    Labs    Chemistry Recent Labs  Lab 10/31/18 1249  10/31/18 1500  11/03/18 0430 11/04/18 0418 11/04/18 1003 11/05/18 0343  NA 139   < > 132*   < > 135 135  --  135  K 2.2*   < > 3.4*   < > 3.4* 3.5  --  3.9  CL 114*   < > 98   < > 103 104  --  106  CO2 18*  --  22   < > 23 23  --  23  GLUCOSE 132*   < > 207*   < > 157* 136*  --  106*  BUN 38*   < > 53*   < > 20 17  --  16  CREATININE 2.71*   < > 3.64*   < > 1.97* 1.85*  --  1.85*  CALCIUM 7.3*  --  11.1*   < > 11.7* 11.7* 11.7* 11.8*  PROT 4.1*  --  6.6  --   --   --   --  6.6  ALBUMIN 2.3*  --  3.7  --   --   --   --  3.5  AST 27  --  163*  --   --   --   --  27  ALT 20  --  92*  --   --   --   --  51*  ALKPHOS 85  --  137*  --   --   --   --  116  BILITOT 0.8  --  1.5*  --   --   --   --  0.5  GFRNONAA 17*  --  12*   < > 25* 28*  --  28*  GFRAA 20*  --  14*   < > 29* 32*  --  32*  ANIONGAP 7  --  12   < > 9 8  --  6   < > = values in this interval not displayed.     Hematology Recent Labs  Lab 11/03/18 0430 11/04/18 0418 11/06/18 0413  WBC 7.2 8.1 10.1  RBC 4.83 5.10 5.20*  HGB 14.1 15.1* 15.0  HCT 42.4 44.7 45.7  MCV 87.8 87.6 87.9  MCH 29.2 29.6 28.8  MCHC 33.3 33.8 32.8  RDW 11.9 12.1 11.9  PLT 177 186 218    Cardiac Enzymes Recent Labs  Lab 11/01/18 0516 11/05/18 1031 11/05/18 1525 11/05/18 2215  TROPONINI 0.42* 0.06* 0.05* 0.06*    Recent Labs  Lab 10/31/18 1253  TROPIPOC 0.33*     BNPNo results for input(s): BNP, PROBNP in the last 168 hours.   DDimer  Recent Labs  Lab 10/31/18 1526  DDIMER 0.82*     Radiology    No results found.  Cardiac Studies   ECHO:   Study Conclusions  - Left ventricle: The cavity size was normal. Wall thickness was   increased  in a pattern of severe LVH. Systolic function was   normal. The estimated ejection fraction was in the range of 55%   to 60%. There is hypokinesis of the apical myocardium. Doppler   parameters are consistent with abnormal left ventricular   relaxation (grade 1 diastolic dysfunction). - Pericardium, extracardiac: A trivial pericardial effusion was   identified.  Impressions:  - Definity used; apical hypokinesis with overall normal LV systolic   function; mild diastolic dysfunction; severe LVH more prominent   at apex; consider apical variant hypertrophic cardiomyopathy or   infiltrative cardiomyopathy.  CATH Diagnostic Diagram        Patient Profile     67 y.o. female with past medical history of pacemaker, diabetes mellitus, chronic stage III kidney disease, hypertension admitted with chest pain and syncope.  Cardiac catheterization revealed 70% ostial ramus relatively small in caliber, 60% ostial diagonal, and very small occluded first marginal.  There was no likely culprit for angina.  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   Assessment & Plan    CHEST PAIN:  Troponins flat yesterday.  No suggestion of acute MI.  She should have serial follow up EKGs in the future to understand her new abnormal baseline.  Continue current therapy.   CKD III:   Creat has been stable and not back today.    HTN:  Norvasc started.  Holding ACE inhibitor.  hydralazine 25 mg po TID.   Increased beta blocker.  Follow up creat prior to discharge and if stable restart ACE inhibitor.   Part of the issues is that she is not taking all of her meds.   PACEMAKER:  Tele restarted telel.  Continue to follow.   N/V:  Thought to be gastroparesis.    GI is involved.   HYPOKALEMIA:   On  daily potassium.     For questions or updates, please contact [place] Please consult [email or web address] for contact info under Cardiology/STEMI.   Signed, Minus Breeding, MD  11/06/2018, 7:50 AM

## 2018-11-06 NOTE — Progress Notes (Signed)
PROGRESS NOTE  Melissa Lopez:[id number] DOB: Mar 28, 1951 DOA: 10/31/2018 PCP: No primary care provider on file.  HPI/Recap of past 24 hours: Melissa Honeycuttis an 67 y.o.femalewith history of type 2 diabetes mellitus with diabetic neuropathy, chronic kidney disease? Stage II, hypertension, CAD, arthritis requiring bilateral knee replacement presented as code STEMI after a near syncopal event at home. Patient admitted to cardiology service and underwent emergent cardiac cath and concern for EKG changes and abnormal troponin. Cardiac cath was unremarkable and did not explain her symptoms. Medicine service consulted for further evaluation.Patient has had dizzy spells, described as lightheaded feeling, for a long time and mostly on walking. She has been walker dependent at baseline due to bilateral arthritis and ataxia/periodic dizziness. The last episode was also associated with some chest discomfort. 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 She takes PPI at baseline.She has chronic exertional dyspnea with no new changes.Labs on presentation showed potassium of 2.2, creatinine of 2.7 (repeat level at 3.8), troponin of 0.33 and EKG showedwide-complex paced rhythm with ST changes.  11/05/2018: Patient seen and examined at bedside with family members in the room.  She had reported chest pain.  Twelve-lead EKG unremarkable.  Troponin unremarkable.  Cardiology and GI following.  11/06/2018: Patient seen and examined with her daughter at bedside.  No acute events overnight.  Has no new complaints.  She is tolerating clear liquid diet.  States she feels better and nausea is improving.  She denies chest pain or dyspnea at rest.  Assessment/Plan: Principal Problem:   Chest pain with high risk of acute coronary syndrome Active Problems:   H/O cardiac pacemaker   Syncope   ST  elevation - with non-obstructive CAD   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HCC)   Type 2 diabetes mellitus with complication, with long-term current use of insulin (HCC)   CKD (chronic kidney disease) stage 3, GFR 30-59 ml/min (HCC) - per patient report   Essential hypertension   AKI (acute kidney injury) ([place])   PE (pulmonary thromboembolism) ([place])   Chest pain: LHC on 10/31/2018 with mild diffuse disease.   -On carvedilol 12.5 mg twice daily -Troponin flat peaked at 0.46 and trended down -Cardiac medications include Coreg 12.5 mg twice daily -Monitor renal function for possible addition of an ACE inhibitor  Syncope/near syncope, falls: Orthostatic vitals, VQ scan, cardiac cath and cortisol level not impressive.  Patient has a pacemaker placed in [place].  Concern for right BPPV per PT. Also concern for autonomic dysfunction in the setting of diabetes although this seems to be fairly controlled. -May need outpatient vestibular rehab for BPPV -PT assessed and recommended home health PT -Fall precautions  Improving nausea and vomiting:  She is tolerating clear liquids Advance diet as recommended by GI  Differential diagnosis include gastroparesis and cannabinoid hyperemesis.  Diastolic CHF: Echocardiogram on 10/31/2018 with EF of 50 to 60%,, severe LVH, hypokinesis of apical myocardium and G1DD.  Currently without cardiopulmonary symptoms.  Appears euvolemic. -Continue daily weight, intake output and renal function.  Type 2 diabetes mellitus, IDDM : A1c 7.0% -Continue CBG monitoring -Continue NovoLog 70/30 20 units twice a day  Mild leukocytosis, UTI: Leukocytosis resolved.  Patient without urinary symptoms.  Urine cultures negative. -Discontinue ceftriaxone 11/23-11/27  Acute on CKD (chronic kidney disease) stage 3-4:  -Baseline creatinine of 1.8 and GFR of 28  -Presented with creatinine of 2.85 -Avoid nephrotoxic agents/dehydration/hypotension -Monitor renal  function -Continue  to hold ACE inhibitor due to renal insufficiency  Resolving uncontrolled hypertension  -Continue amlodipine dose increased to 10 mg daily -Continue Coreg 12.5 mg twice daily  Hypercalcemia:  -Check PTH, PTHrp, thyroid functions and vit D level.  Code Status: Full code DVT Prophylaxis:   Subcu heparin 3 times daily Family Communication:  Daughter at bedside all questions answered to her satisfaction.  Disposition Plan:   Discharge when cardiology/GI signed off.  Procedures:  Cardiac cath  LV end diastolic pressure is mildly elevated.  Ost Ramus lesion is 70% stenosed -relatively small caliber vessel. Not likely because of resting chest pain and ST elevations.  Ost 1st Diag lesion is 60% stenosed. -Not likely flow-limiting.  Very small caliber (<6mm) 1st Mrg lesion appears to be 100% stenosed with faint collateralization. Too small for angioplasty or stenting.     Objective: Vitals:   11/05/18 1403 11/05/18 2110 11/06/18 0555 11/06/18 1522  BP: (!) 181/101 (!) 175/97 (!) 168/99 129/84  Pulse: 60  73 73  Resp: 13  14 19   Temp: 98.4 F (36.9 C) 98.4 F (36.9 C) 98.2 F (36.8 C) (!) 97.4 F (36.3 C)  TempSrc: Oral Oral Oral Oral  SpO2: 98% 99% 96% 95%  Weight:   83.7 kg   Height:        Intake/Output Summary (Last 24 hours) at 11/06/2018 1728 Last data filed at 11/06/2018 1000 Gross per 24 hour  Intake 588.01 ml  Output 1300 ml  Net -711.99 ml   Filed Weights   11/03/18 0539 11/04/18 0313 11/06/18 0555  Weight: 86.1 kg 84.3 kg 83.7 kg    Exam:  . General: 67 y.o. year-old female well-developed well-nourished in no acute distress.  Alert and oriented x3.   . Cardiovascular: Regular rate and rhythm with no rubs or gallops.  No JVD or thyromegaly noted.   Marland Kitchen Respiratory: Clear to auscultation with no wheezes or rales.  Good respiratory effort. . Abdomen: Soft nontender nondistended with normal bowel sounds x4  quadrants. . Musculoskeletal: No lower extremity edema. 2/4 pulses in all 4 extremities. . Skin: No ulcerative lesions noted or rashes, . Psychiatry: Mood is appropriate for condition and setting   Data Reviewed: CBC: Recent Labs  Lab 10/31/18 1249  11/01/18 0516 11/02/18 0333 11/03/18 0430 11/04/18 0418 11/06/18 0413  WBC 11.5*  --  12.5* 6.9 7.2 8.1 10.1  NEUTROABS 7.9*  --   --   --   --   --   --   HGB 11.5*   < > 14.1 13.3 14.1 15.1* 15.0  HCT 35.2*   < > 42.0 40.5 42.4 44.7 45.7  MCV 90.7  --  88.8 89.6 87.8 87.6 87.9  PLT 181  --  175 162 177 186 218   < > = values in this interval not displayed.   Basic Metabolic Panel: Recent Labs  Lab 11/02/18 0333 11/03/18 0430 11/04/18 0418 11/04/18 1003 11/05/18 0343 11/06/18 0727  NA 137 135 135  --  135 134*  K 3.2* 3.4* 3.5  --  3.9 3.9  CL 104 103 104  --  106 104  CO2 26 23 23   --  23 22  GLUCOSE 132* 157* 136*  --  106* 153*  BUN 30* 20 17  --  16 18  CREATININE 2.10* 1.97* 1.85*  --  1.85* 1.96*  CALCIUM 11.1* 11.7* 11.7* 11.7* 11.8* 12.1*   GFR: Estimated Creatinine Clearance: 28.5 mL/min (A) (by C-G formula based on SCr of  1.96 mg/dL (H)). Liver Function Tests: Recent Labs  Lab 10/31/18 1249 10/31/18 1500 11/05/18 0343  AST 27 163* 27  ALT 20 92* 51*  ALKPHOS 85 137* 116  BILITOT 0.8 1.5* 0.5  PROT 4.1* 6.6 6.6  ALBUMIN 2.3* 3.7 3.5   No results for input(s): LIPASE, AMYLASE in the last 168 hours. No results for input(s): AMMONIA in the last 168 hours. Coagulation Profile: Recent Labs  Lab 10/31/18 1249  INR 1.31   Cardiac Enzymes: Recent Labs  Lab 10/31/18 2052 11/01/18 0516 11/05/18 1031 11/05/18 1525 11/05/18 2215  TROPONINI 0.45* 0.42* 0.06* 0.05* 0.06*   BNP (last 3 results) No results for input(s): PROBNP in the last 8760 hours. HbA1C: No results for input(s): HGBA1C in the last 72 hours. CBG: Recent Labs  Lab 11/05/18 1626 11/05/18 2112 11/06/18 0723 11/06/18 1127  11/06/18 1657  GLUCAP 129* 117* 154* 147* 117*   Lipid Profile: No results for input(s): CHOL, HDL, LDLCALC, TRIG, CHOLHDL, LDLDIRECT in the last 72 hours. Thyroid Function Tests: Recent Labs    11/04/18 1003  TSH 1.475  FREET4 1.32   Anemia Panel: No results for input(s): VITAMINB12, FOLATE, FERRITIN, TIBC, IRON, RETICCTPCT in the last 72 hours. Urine analysis:    Component Value Date/Time   COLORURINE YELLOW 10/31/2018 1751   APPEARANCEUR CLOUDY (A) 10/31/2018 1751   LABSPEC 1.026 10/31/2018 1751   PHURINE 5.0 10/31/2018 1751   GLUCOSEU NEGATIVE 10/31/2018 1751   HGBUR SMALL (A) 10/31/2018 1751   BILIRUBINUR NEGATIVE 10/31/2018 1751   KETONESUR NEGATIVE 10/31/2018 1751   PROTEINUR 30 (A) 10/31/2018 1751   NITRITE NEGATIVE 10/31/2018 1751   LEUKOCYTESUR LARGE (A) 10/31/2018 1751   Sepsis Labs: @LABRCNTIP (procalcitonin:4,lacticidven:4)  ) Recent Results (from the past 240 hour(s))  MRSA PCR Screening     Status: None   Collection Time: 10/31/18  2:24 PM  Result Value Ref Range Status   MRSA by PCR NEGATIVE NEGATIVE Final    Comment:        The GeneXpert MRSA Assay (FDA approved for NASAL specimens only), is one component of a comprehensive MRSA colonization surveillance program. It is not intended to diagnose MRSA infection nor to guide or monitor treatment for MRSA infections. Performed at [facility], [place] [address], [place], [place] [address]   Urine Culture     Status: None   Collection Time: 11/02/18  3:21 PM  Result Value Ref Range Status   Specimen Description URINE, RANDOM  Final   Special Requests NONE  Final   Culture   Final    NO GROWTH Performed at [facility], [place] [address], [place], [place] [address]    Report Status 11/03/2018 FINAL  Final      Studies: No results found.  Scheduled Meds: . amLODipine  10 mg Oral Daily  . aspirin  81 mg Oral Daily  . carvedilol  12.5 mg Oral BID WC  . heparin injection (subcutaneous)   5,000 Units Subcutaneous Q8H  . hydrALAZINE  25 mg Oral Q8H  . insulin aspart  0-20 Units Subcutaneous TID WC  . insulin aspart  0-5 Units Subcutaneous QHS  . insulin aspart protamine- aspart  12 Units Subcutaneous BID WC  . metoCLOPramide (REGLAN) injection  5 mg Intravenous Q6H  . pantoprazole (PROTONIX) IV  40 mg Intravenous Q24H  . pneumococcal 23 valent vaccine  0.5 mL Intramuscular Tomorrow-1000  . polyethylene glycol  17 g Oral TID  . potassium chloride  40 mEq Oral  Daily  . sodium chloride flush  3 mL Intravenous Q12H    Continuous Infusions: . sodium chloride 10 mL/hr at 11/06/18 0300     LOS: 6 days     Kayleen Memos, MD [facility] Pager [phone]  If 7PM-7AM, please contact night-coverage [email or web address] Password [facility] 11/06/2018, 5:28 PM

## 2018-11-06 NOTE — Progress Notes (Signed)
Pt stating she has not had a BM in 7 days. She thinks that having would would help her vomiting. She is concerned that she is is becoming impacted and requesting suppository or fleet enema. Paged Internal Med with this information.

## 2018-11-07 DIAGNOSIS — R55 Syncope and collapse: Secondary | ICD-10-CM

## 2018-11-07 DIAGNOSIS — E0821 Diabetes mellitus due to underlying condition with diabetic nephropathy: Secondary | ICD-10-CM

## 2018-11-07 DIAGNOSIS — N183 Chronic kidney disease, stage 3 (moderate): Secondary | ICD-10-CM

## 2018-11-07 DIAGNOSIS — I10 Essential (primary) hypertension: Secondary | ICD-10-CM

## 2018-11-07 DIAGNOSIS — Z950 Presence of cardiac pacemaker: Secondary | ICD-10-CM

## 2018-11-07 DIAGNOSIS — I25119 Atherosclerotic heart disease of native coronary artery with unspecified angina pectoris: Secondary | ICD-10-CM

## 2018-11-07 LAB — BASIC METABOLIC PANEL
Anion gap: 11 (ref 5–15)
BUN: 23 mg/dL (ref 8–23)
CO2: 21 mmol/L — ABNORMAL LOW (ref 22–32)
Calcium: 11.9 mg/dL — ABNORMAL HIGH (ref 8.9–10.3)
Chloride: 101 mmol/L (ref 98–111)
Creatinine, Ser: 2.1 mg/dL — ABNORMAL HIGH (ref 0.44–1.00)
GFR calc Af Amer: 28 mL/min — ABNORMAL LOW (ref >60)
GFR, EST NON AFRICAN AMERICAN: 24 mL/min — AB (ref >60)
GLUCOSE: 130 mg/dL — AB (ref 70–99)
Potassium: 4.2 mmol/L (ref 3.5–5.1)
Sodium: 133 mmol/L — ABNORMAL LOW (ref 135–145)

## 2018-11-07 LAB — GLUCOSE, CAPILLARY
Glucose-Capillary: 145 mg/dL — ABNORMAL HIGH (ref 70–99)
Glucose-Capillary: 121 mg/dL — ABNORMAL HIGH (ref 70–99)
Glucose-Capillary: 123 mg/dL — ABNORMAL HIGH (ref 70–99)
Glucose-Capillary: 132 mg/dL — ABNORMAL HIGH (ref 70–99)

## 2018-11-07 MED ORDER — SENNOSIDES-DOCUSATE SODIUM 8.6-50 MG PO TABS
2.0000 | ORAL_TABLET | Freq: Two times a day (BID) | ORAL | Status: DC
  Administered 2018-11-07 – 2018-11-11 (×8): 2 via ORAL
  Filled 2018-11-07 (×9): qty 2

## 2018-11-07 MED ORDER — SODIUM CHLORIDE 0.9 % IV SOLN
INTRAVENOUS | Status: DC
  Administered 2018-11-07 – 2018-11-11 (×6): via INTRAVENOUS

## 2018-11-07 MED ORDER — PANTOPRAZOLE SODIUM 40 MG PO TBEC
40.0000 mg | DELAYED_RELEASE_TABLET | Freq: Every day | ORAL | Status: DC
  Administered 2018-11-08 – 2018-11-10 (×3): 40 mg via ORAL
  Filled 2018-11-07 (×3): qty 1

## 2018-11-07 NOTE — Progress Notes (Signed)
Progress Note  Patient Name: Melissa Lopez Date of Encounter: 11/07/2018  Primary Cardiologist: [facility] -[address] [place], [place].  [phone])  Subjective   No chest pain or breathlessness.  Has not ambulated much as yet.  Did have a bowel movement after recent constipation.  Limited appetite.  Inpatient Medications    Scheduled Meds: . amLODipine  10 mg Oral Daily  . aspirin  81 mg Oral Daily  . carvedilol  12.5 mg Oral BID WC  . heparin injection (subcutaneous)  5,000 Units Subcutaneous Q8H  . hydrALAZINE  25 mg Oral Q8H  . insulin aspart  0-20 Units Subcutaneous TID WC  . insulin aspart  0-5 Units Subcutaneous QHS  . insulin aspart protamine- aspart  12 Units Subcutaneous BID WC  . metoCLOPramide (REGLAN) injection  5 mg Intravenous Q6H  . pantoprazole (PROTONIX) IV  40 mg Intravenous Q24H  . pneumococcal 23 valent vaccine  0.5 mL Intramuscular Tomorrow-1000  . polyethylene glycol  17 g Oral TID  . potassium chloride  40 mEq Oral Daily  . senna-docusate  2 tablet Oral BID  . sodium chloride flush  3 mL Intravenous Q12H   Continuous Infusions: . sodium chloride 10 mL/hr at 11/06/18 0300  . sodium chloride     PRN Meds: sodium chloride, albuterol, bisacodyl, morphine injection, ondansetron (ZOFRAN) IV, sodium chloride flush   Vital Signs    Vitals:   11/06/18 1924 11/06/18 2317 11/07/18 0418 11/07/18 0554  BP: 116/76 137/86 138/80 (!) 153/98  Pulse: (!) 59  67   Resp: 14  (!) 25   Temp: 98 F (36.7 C)     TempSrc: Oral     SpO2: 98%  91%   Weight:   83.7 kg   Height:        Intake/Output Summary (Last 24 hours) at 11/07/2018 0802 Last data filed at 11/06/2018 1000 Gross per 24 hour  Intake 120 ml  Output 300 ml  Net -180 ml   Filed Weights   11/04/18 0313 11/06/18 0555 11/07/18 0418  Weight: 84.3 kg 83.7 kg 83.7 kg    Telemetry    Sinus rhythm and intermittent atrial paced rhythm.  Personally reviewed.  Physical Exam    GEN:  Obese woman, no acute distress.   Neck: No JVD. Cardiac: RRR, soft systolic murmur, no gallop.  Respiratory: Nonlabored. Clear to auscultation bilaterally. GI: Soft, nontender, bowel sounds present. MS: No edema; No deformity. Neuro:  Nonfocal. Psych: Alert and oriented x 3. Normal affect.  Labs    Chemistry Recent Labs  Lab 10/31/18 1249  10/31/18 1500  11/05/18 0343 11/06/18 0727 11/07/18 0309  NA 139   < > 132*   < > 135 134* 133*  K 2.2*   < > 3.4*   < > 3.9 3.9 4.2  CL 114*   < > 98   < > 106 104 101  CO2 18*  --  22   < > 23 22 21*  GLUCOSE 132*   < > 207*   < > 106* 153* 130*  BUN 38*   < > 53*   < > 16 18 23   CREATININE 2.71*   < > 3.64*   < > 1.85* 1.96* 2.10*  CALCIUM 7.3*  --  11.1*   < > 11.8* 12.1* 11.9*  PROT 4.1*  --  6.6  --  6.6  --   --   ALBUMIN 2.3*  --  3.7  --  3.5  --   --   AST 27  --  163*  --  27  --   --   ALT 20  --  92*  --  51*  --   --   ALKPHOS 85  --  137*  --  116  --   --   BILITOT 0.8  --  1.5*  --  0.5  --   --   GFRNONAA 17*  --  12*   < > 28* 26* 24*  GFRAA 20*  --  14*   < > 32* 30* 28*  ANIONGAP 7  --  12   < > 6 8 11    < > = values in this interval not displayed.     Hematology Recent Labs  Lab 11/03/18 0430 11/04/18 0418 11/06/18 0413  WBC 7.2 8.1 10.1  RBC 4.83 5.10 5.20*  HGB 14.1 15.1* 15.0  HCT 42.4 44.7 45.7  MCV 87.8 87.6 87.9  MCH 29.2 29.6 28.8  MCHC 33.3 33.8 32.8  RDW 11.9 12.1 11.9  PLT 177 186 218    Cardiac Enzymes Recent Labs  Lab 11/01/18 0516 11/05/18 1031 11/05/18 1525 11/05/18 2215  TROPONINI 0.42* 0.06* 0.05* 0.06*    Recent Labs  Lab 10/31/18 1253  TROPIPOC 0.33*     DDimer  Recent Labs  Lab 10/31/18 1526  DDIMER 0.82*     Radiology    No results found.  Cardiac Studies   Cardiac catheterization 41/32/4401:  LV end diastolic pressure is mildly elevated.  Ost Ramus lesion is 70% stenosed -relatively small caliber vessel. Not likely because of resting chest  pain and ST elevations.  Ost 1st Diag lesion is 60% stenosed. -Not likely flow-limiting.  Very small caliber (<35mm) 1st Mrg lesion appears to be 100% stenosed with faint collateralization. Too small for angioplasty or stenting.   SUMMARY:  Angiographically mild diffuse disease, only potentially occluded vessels are very very small branch of the major obtuse marginal.  Not likely a culprit lesion for significant anginal chest pain.  Mild to moderately elevated LVEDP.  LV gram not performed due to renal insufficiency.  Echocardiogram 10/31/2018: Study Conclusions  - Left ventricle: The cavity size was normal. Wall thickness was   increased in a pattern of severe LVH. Systolic function was   normal. The estimated ejection fraction was in the range of 55%   to 60%. There is hypokinesis of the apical myocardium. Doppler   parameters are consistent with abnormal left ventricular   relaxation (grade 1 diastolic dysfunction). - Pericardium, extracardiac: A trivial pericardial effusion was   identified.  Impressions:  - Definity used; apical hypokinesis with overall normal LV systolic   function; mild diastolic dysfunction; severe LVH more prominent   at apex; consider apical variant hypertrophic cardiomyopathy or   infiltrative cardiomyopathy.  Patient Profile     67 y.o. female with a history of [employer] Jude pacemaker for treatment of apparent symptomatic bradycardia, CKD stage III, hypertension, and type 2 diabetes mellitus with nephropathy, currently admitted after recent reported syncopal events, also nausea and vomiting as well as general malaise and chest pain.  Cardiac catheterization demonstrated branch vessel disease without clear culprit and LVEF 55 to 60% with apical hypokinesis.  Equal therapy plan from a cardiac perspective.  Assessment & Plan    1.  Chest pain and syncope.  Although baseline ECG is abnormal with original concern for ACS, troponin I levels were minimally  elevated and flat pattern.  Cardiac catheterization showed branch vessel disease with no obvious culprit lesion.  Cardiac rhythm has been stable with no arrhythmias.  Plan is for medical therapy.  2.  St. Jude pacemaker in place, apparent history of symptomatic bradycardia.  Intermittent atrial pacing noted.  3.  CKD stage III, most recent creatinine 2.1.  Not on ACE inhibitor or ARB at this time.  4.  Essential hypertension.  Now on Norvasc, hydralazine, and Coreg.  5.  Intermittent nausea and emesis, possible gastroparesis.  Continue aspirin, Norvasc, Coreg, hydralazine at current doses, continue to follow heart rate and blood pressure control.  Would not add back ACE inhibitor at this point.  Also consider low-dose statin therapy with diabetes mellitus and vascular disease, her recent LDL was 64-94.  Increase activity.  Signed, Rozann Lesches, MD  11/07/2018, 8:02 AM

## 2018-11-07 NOTE — Progress Notes (Signed)
PROGRESS NOTE  Melissa Lopez ONG:[id number] DOB: Feb 14, 1951 DOA: 10/31/2018 PCP: No primary care provider on file.  HPI/Recap of past 24 hours: Klaira Honeycuttis an 67 y.o.femalewith history of type 2 diabetes mellitus with diabetic neuropathy, chronic kidney disease? Stage II, hypertension, CAD, arthritis requiring bilateral knee replacement presented as code STEMI after a near syncopal event at home. Patient admitted to cardiology service and underwent emergent cardiac cath and concern for EKG changes and abnormal troponin. Cardiac cath was unremarkable and did not explain her symptoms. Medicine service consulted for further evaluation.Patient has had dizzy spells, described as lightheaded feeling, for a long time and mostly on walking. She has been walker dependent at baseline due to bilateral arthritis and ataxia/periodic dizziness. The last episode was also associated with some chest discomfort. 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 She takes PPI at baseline.She has chronic exertional dyspnea with no new changes.Labs on presentation showed potassium of 2.2, creatinine of 2.7 (repeat level at 3.8), troponin of 0.33 and EKG showedwide-complex paced rhythm with ST changes.  11/05/2018:  Chest pain.  Twelve-lead EKG unremarkable.  Troponin unremarkable.   11/06/2018: Tolerating clear liquid diet.   11/07/2018: Patient seen and examined at bedside.  Daughter is present.  She had several bowel movements last night.  States she feels better.  GI signed off on 11/07/2018.  Assessment/Plan: Principal Problem:   Chest pain with high risk of acute coronary syndrome Active Problems:   H/O cardiac pacemaker   Syncope   ST elevation - with non-obstructive CAD   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HCC)   Type 2 diabetes mellitus with complication, with long-term  current use of insulin (HCC)   CKD (chronic kidney disease) stage 3, GFR 30-59 ml/min (HCC) - per patient report   Essential hypertension   AKI (acute kidney injury) ([place])   PE (pulmonary thromboembolism) ([facility])   Chest pain: LHC on 10/31/2018 with mild diffuse disease.  -Cardiology would like to treat conservatively  -On carvedilol 12.5 mg twice daily -Troponin flat peaked at 0.46 and trended down -Cardiac medications include Coreg 12.5 mg twice daily -No addition of ACE inhibitor due to renal insufficiency  Syncope/near syncope, falls: Orthostatic vitals, VQ scan, cardiac cath and cortisol level not impressive.  Patient has a pacemaker placed in [place].  Concern for right BPPV per PT. Also concern for autonomic dysfunction in the setting of diabetes although this seems to be fairly controlled. -May need outpatient vestibular rehab for BPPV -PT assessed and recommended home health PT -Fall precautions  Improving nausea and vomiting:  Suspect nausea and vomiting is multifactorial secondary to cannabinoid hyperemesis syndrome versus gastroparesis Has tolerated clear liquid diet Advancing her diet to soft diet If she tolerates soft diet will switch PPI to p.o. and possibly discharge tomorrow 11/08/2018 to home with home health PT and RN.  Diastolic CHF: Echocardiogram on 10/31/2018 with EF of 50 to 60%,, severe LVH, hypokinesis of apical myocardium and G1DD.  Currently without cardiopulmonary symptoms.  Appears euvolemic. -Continue daily weight, intake output and renal function.  Type 2 diabetes mellitus, IDDM : A1c 7.0% -Continue CBG monitoring -Continue NovoLog 70/30 20 units twice a day  Mild leukocytosis, UTI: Leukocytosis resolved.  Patient without urinary symptoms.  Urine cultures negative. -Discontinue ceftriaxone 11/23-11/27  Acute on CKD (chronic kidney disease) stage 3-4:  -Baseline creatinine of 1.8 and GFR of 28  -Presented with creatinine of 2.85 -Creatinine  2.10 today from 1.96 yesterday -Avoid nephrotoxic agents/dehydration/hypotension -Monitor urine output -Continue to hold ACE inhibitor due to renal insufficiency  Resolving uncontrolled hypertension  -Continue amlodipine dose increased to 10 mg daily -Continue Coreg 12.5 mg twice daily  Hypercalcemia:  -Check PTH, PTHrp, thyroid functions and vit D level.  Code Status: Full code DVT Prophylaxis:   Subcu heparin 3 times daily Family Communication:  Daughter at bedside all questions answered to her satisfaction.  Disposition Plan:  Possible discharge tomorrow 11/08/2018 with home health PT and RN or when cardiology signs off.   Procedures:  Cardiac cath  LV end diastolic pressure is mildly elevated.  Ost Ramus lesion is 70% stenosed -relatively small caliber vessel. Not likely because of resting chest pain and ST elevations.  Ost 1st Diag lesion is 60% stenosed. -Not likely flow-limiting.  Very small caliber (<75mm) 1st Mrg lesion appears to be 100% stenosed with faint collateralization. Too small for angioplasty or stenting.     Objective: Vitals:   11/06/18 2317 11/07/18 0418 11/07/18 0554 11/07/18 1344  BP: 137/86 138/80 (!) 153/98 128/64  Pulse:  67  (!) 59  Resp:  (!) 25    Temp:    97.6 F (36.4 C)  TempSrc:    Oral  SpO2:  91%  93%  Weight:  83.7 kg    Height:       No intake or output data in the 24 hours ending 11/07/18 1539 Filed Weights   11/04/18 0313 11/06/18 0555 11/07/18 0418  Weight: 84.3 kg 83.7 kg 83.7 kg    Exam:  . General: 67 y.o. year-old female well-developed well-nourished in no acute distress.  Alert and oriented x3.   . Cardiovascular: Regular rate and rhythm with no rubs or gallops.  No JVD or thyromegaly noted. Marland Kitchen Respiratory: To auscultation with no wheezes or rales.  Good respiratory effort.  . Abdomen: Soft nontender nondistended with normal bowel sounds x4 quadrants. . Musculoskeletal: No lower extremity edema. 2/4 pulses in  all 4 extremities. . Skin: No ulcerative lesions noted or rashes, . Psychiatry: Mood is appropriate for condition and setting   Data Reviewed: CBC: Recent Labs  Lab 11/01/18 0516 11/02/18 0333 11/03/18 0430 11/04/18 0418 11/06/18 0413  WBC 12.5* 6.9 7.2 8.1 10.1  HGB 14.1 13.3 14.1 15.1* 15.0  HCT 42.0 40.5 42.4 44.7 45.7  MCV 88.8 89.6 87.8 87.6 87.9  PLT 175 162 177 186 536   Basic Metabolic Panel: Recent Labs  Lab 11/03/18 0430 11/04/18 0418 11/04/18 1003 11/05/18 0343 11/06/18 0727 11/07/18 0309  NA 135 135  --  135 134* 133*  K 3.4* 3.5  --  3.9 3.9 4.2  CL 103 104  --  106 104 101  CO2 23 23  --  23 22 21*  GLUCOSE 157* 136*  --  106* 153* 130*  BUN 20 17  --  16 18 23   CREATININE 1.97* 1.85*  --  1.85* 1.96* 2.10*  CALCIUM 11.7* 11.7* 11.7* 11.8* 12.1* 11.9*   GFR: Estimated Creatinine Clearance: 26.6 mL/min (A) (by C-G formula based on SCr of 2.1 mg/dL (H)). Liver Function Tests: Recent Labs  Lab 11/05/18 0343  AST 27  ALT 51*  ALKPHOS 116  BILITOT 0.5  PROT 6.6  ALBUMIN 3.5   No results for input(s): LIPASE, AMYLASE in the last 168 hours. No results for input(s): AMMONIA in the last 168 hours. Coagulation Profile: No results for input(s): INR, PROTIME in the last 168 hours. Cardiac Enzymes:  Recent Labs  Lab 10/31/18 2052 11/01/18 0516 11/05/18 1031 11/05/18 1525 11/05/18 2215  TROPONINI 0.45* 0.42* 0.06* 0.05* 0.06*   BNP (last 3 results) No results for input(s): PROBNP in the last 8760 hours. HbA1C: No results for input(s): HGBA1C in the last 72 hours. CBG: Recent Labs  Lab 11/06/18 1127 11/06/18 1657 11/06/18 2136 11/07/18 0732 11/07/18 1148  GLUCAP 147* 117* 196* 145* 121*   Lipid Profile: No results for input(s): CHOL, HDL, LDLCALC, TRIG, CHOLHDL, LDLDIRECT in the last 72 hours. Thyroid Function Tests: No results for input(s): TSH, T4TOTAL, FREET4, T3FREE, THYROIDAB in the last 72 hours. Anemia Panel: No results for  input(s): VITAMINB12, FOLATE, FERRITIN, TIBC, IRON, RETICCTPCT in the last 72 hours. Urine analysis:    Component Value Date/Time   COLORURINE YELLOW 10/31/2018 1751   APPEARANCEUR CLOUDY (A) 10/31/2018 1751   LABSPEC 1.026 10/31/2018 1751   PHURINE 5.0 10/31/2018 1751   GLUCOSEU NEGATIVE 10/31/2018 1751   HGBUR SMALL (A) 10/31/2018 1751   BILIRUBINUR NEGATIVE 10/31/2018 1751   KETONESUR NEGATIVE 10/31/2018 1751   PROTEINUR 30 (A) 10/31/2018 1751   NITRITE NEGATIVE 10/31/2018 1751   LEUKOCYTESUR LARGE (A) 10/31/2018 1751   Sepsis Labs: @LABRCNTIP (procalcitonin:4,lacticidven:4)  ) Recent Results (from the past 240 hour(s))  MRSA PCR Screening     Status: None   Collection Time: 10/31/18  2:24 PM  Result Value Ref Range Status   MRSA by PCR NEGATIVE NEGATIVE Final    Comment:        The GeneXpert MRSA Assay (FDA approved for NASAL specimens only), is one component of a comprehensive MRSA colonization surveillance program. It is not intended to diagnose MRSA infection nor to guide or monitor treatment for MRSA infections. Performed at [facility], [place] [address] [place], [place] [address]   Urine Culture     Status: None   Collection Time: 11/02/18  3:21 PM  Result Value Ref Range Status   Specimen Description URINE, RANDOM  Final   Special Requests NONE  Final   Culture   Final    NO GROWTH Performed at [facility], [place] [address], [place], [place] [address]    Report Status 11/03/2018 FINAL  Final      Studies: No results found.  Scheduled Meds: . amLODipine  10 mg Oral Daily  . aspirin  81 mg Oral Daily  . carvedilol  12.5 mg Oral BID WC  . heparin injection (subcutaneous)  5,000 Units Subcutaneous Q8H  . hydrALAZINE  25 mg Oral Q8H  . insulin aspart  0-20 Units Subcutaneous TID WC  . insulin aspart  0-5 Units Subcutaneous QHS  . insulin aspart protamine- aspart  12 Units Subcutaneous BID WC  . metoCLOPramide (REGLAN) injection  5 mg  Intravenous Q6H  . [START ON 11/08/2018] pantoprazole  40 mg Oral Daily  . pneumococcal 23 valent vaccine  0.5 mL Intramuscular Tomorrow-1000  . polyethylene glycol  17 g Oral TID  . potassium chloride  40 mEq Oral Daily  . senna-docusate  2 tablet Oral BID  . sodium chloride flush  3 mL Intravenous Q12H    Continuous Infusions: . sodium chloride 10 mL/hr at 11/06/18 0300  . sodium chloride 75 mL/hr at 11/07/18 1000     LOS: 7 days     Kayleen Memos, MD [facility] Pager [phone]  If 7PM-7AM, please contact night-coverage [email or web address] Password [facility] 11/07/2018, 3:39 PM

## 2018-11-07 NOTE — Progress Notes (Signed)
[facility] Gastroenterology Progress Note  Melissa Lopez 67 y.o. 1951/06/04  CC:  Nausea and vomiting   Subjective: patient is feeling better. No further nausea and vomiting. Tolerating liquid diet. Had a few bowel movements yesterday. Denies any blood in the stool.     Objective: Vital signs in last 24 hours: Vitals:   11/07/18 0418 11/07/18 0554  BP: 138/80 (!) 153/98  Pulse: 67   Resp: (!) 25   Temp:    SpO2: 91%     Physical Exam:  Gen. Alert/oriented 3. Not in acute distress Abdomen. Soft, nontender, nondistended, bowel sounds present.  Lab Results: Recent Labs    11/06/18 0727 11/07/18 0309  NA 134* 133*  K 3.9 4.2  CL 104 101  CO2 22 21*  GLUCOSE 153* 130*  BUN 18 23  CREATININE 1.96* 2.10*  CALCIUM 12.1* 11.9*   Recent Labs    11/05/18 0343  AST 27  ALT 51*  ALKPHOS 116  BILITOT 0.5  PROT 6.6  ALBUMIN 3.5   Recent Labs    11/06/18 0413  WBC 10.1  HGB 15.0  HCT 45.7  MCV 87.9  PLT 218   No results for input(s): LABPROT, INR in the last 72 hours.    Assessment/Plan: - recurrent nausea and vomiting. 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 - Constipation. Resolved.  Recommendations ----------------------- - advance diet to soft diet. - Change PPI to oral once a day once he is able to tolerate soft diet. - Continue MiraLAX as needed for constipation. - Follow-up with Dr. Therisa Doyne in 4 weeks after discharge. - GI will sign off. Call us back if needed   Otis Brace MD, [place] 11/07/2018, 11:44 AM  Co ntact #  [phone]

## 2018-11-08 LAB — GLUCOSE, CAPILLARY
Glucose-Capillary: 153 mg/dL — ABNORMAL HIGH (ref 70–99)
Glucose-Capillary: 135 mg/dL — ABNORMAL HIGH (ref 70–99)
Glucose-Capillary: 138 mg/dL — ABNORMAL HIGH (ref 70–99)
Glucose-Capillary: 134 mg/dL — ABNORMAL HIGH (ref 70–99)

## 2018-11-08 LAB — BASIC METABOLIC PANEL
Anion gap: 7 (ref 5–15)
BUN: 22 mg/dL (ref 8–23)
CALCIUM: 11.5 mg/dL — AB (ref 8.9–10.3)
CO2: 24 mmol/L (ref 22–32)
Chloride: 105 mmol/L (ref 98–111)
Creatinine, Ser: 1.98 mg/dL — ABNORMAL HIGH (ref 0.44–1.00)
GFR calc Af Amer: 30 mL/min — ABNORMAL LOW (ref >60)
GFR, EST NON AFRICAN AMERICAN: 26 mL/min — AB (ref >60)
Glucose, Bld: 111 mg/dL — ABNORMAL HIGH (ref 70–99)
Potassium: 4.2 mmol/L (ref 3.5–5.1)
SODIUM: 136 mmol/L (ref 135–145)

## 2018-11-08 MED ORDER — ROSUVASTATIN CALCIUM 5 MG PO TABS
5.0000 mg | ORAL_TABLET | Freq: Every day | ORAL | Status: DC
  Administered 2018-11-08 – 2018-11-11 (×4): 5 mg via ORAL
  Filled 2018-11-08 (×4): qty 1

## 2018-11-08 MED ORDER — METOCLOPRAMIDE HCL 5 MG/ML IJ SOLN
5.0000 mg | Freq: Four times a day (QID) | INTRAMUSCULAR | Status: DC
  Administered 2018-11-08 – 2018-11-11 (×11): 5 mg via INTRAVENOUS
  Filled 2018-11-08 (×11): qty 2

## 2018-11-08 NOTE — Progress Notes (Signed)
PROGRESS NOTE  Melissa Lopez JJO:[id number] DOB: September 19, 1951 DOA: 10/31/2018 PCP: No primary care provider on file.  HPI/Recap of past 24 hours: Melissa Lopez an 67 y.o.femalewith history of type 2 diabetes mellitus with diabetic neuropathy, chronic kidney disease? Stage II, hypertension, CAD, arthritis requiring bilateral knee replacement presented as code STEMI after a near syncopal event at home. Patient admitted to cardiology service and underwent emergent cardiac cath and concern for EKG changes and abnormal troponin. Cardiac cath was unremarkable and did not explain her symptoms. Medicine service consulted for further evaluation.Patient has had dizzy spells, described as lightheaded feeling, for a long time and mostly on walking. She has been walker dependent at baseline due to bilateral arthritis and ataxia/periodic dizziness. The last episode was also associated with some chest discomfort. 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 She takes PPI at baseline.She has chronic exertional dyspnea with no new changes.Labs on presentation showed potassium of 2.2, creatinine of 2.7 (repeat level at 3.8), troponin of 0.33 and EKG showedwide-complex paced rhythm with ST changes.  11/05/2018:  Chest pain.  Twelve-lead EKG unremarkable.  Troponin unremarkable.   11/06/2018: Tolerating clear liquid diet.   11/07/2018: Patient seen and examined at bedside.  Daughter is present.  She had several bowel movements last night.  States she feels better.  GI signed off on 11/07/2018.  11/08/2018: Patient seen and examined with her daughter at bedside.  Reports persistent nausea and vomiting off Reglan.  Will restart IV Reglan 5 mg every 6 hours.  Assessment/Plan: Principal Problem:   Chest pain with high risk of acute coronary syndrome Active Problems:   H/O cardiac pacemaker  Syncope   ST elevation - with non-obstructive CAD   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HCC)   Type 2 diabetes mellitus with complication, with long-term current use of insulin (HCC)   CKD (chronic kidney disease) stage 3, GFR 30-59 ml/min (HCC) - per patient report   Essential hypertension   AKI (acute kidney injury) ([place])   PE (pulmonary thromboembolism) ([place])   Chest pain: LHC on 10/31/2018 with mild diffuse disease.  -Cardiology would like to treat conservatively  -On carvedilol 12.5 mg twice daily -Troponin flat peaked at 0.46 and trended down -Cardiac medications include Coreg 12.5 mg twice daily -No addition of ACE inhibitor due to renal insufficiency  Intractable nausea with high suspicion for gastroparesis  Took patient off Reglan yesterday afternoon now back to nausea and vomiting Restart Reglan 5 mg every 6 hours Reconsult GI Patient will need to follow-up with GI and have gastric emptying study eventually to confirm gastroparesis Will need to be off Reglan for 2 weeks prior to study for accuracy of results  Syncope/near syncope, falls: Orthostatic vitals, VQ scan, cardiac cath and cortisol level not impressive.  Patient has a pacemaker placed in [place].  Concern for right BPPV per PT. Also concern for autonomic dysfunction in the setting of diabetes although this seems to be fairly controlled. -May need outpatient vestibular rehab for BPPV -PT assessed and recommended home health PT -Fall precautions  Improving nausea and vomiting:  Suspect nausea and vomiting is multifactorial secondary to cannabinoid hyperemesis syndrome versus gastroparesis Has tolerated clear liquid diet Advancing her diet to soft diet If she tolerates soft diet will switch PPI to p.o. and possibly discharge tomorrow 11/08/2018 to home with home health PT and RN.  Diastolic CHF: Echocardiogram on 10/31/2018 with EF of 50 to  60%,, severe LVH, hypokinesis of apical myocardium and G1DD.   Currently without cardiopulmonary symptoms.  Appears euvolemic. -Continue daily weight, intake output and renal function.  Type 2 diabetes mellitus, IDDM : A1c 7.0% -Continue CBG monitoring -Continue NovoLog 70/30 20 units twice a day  Mild leukocytosis, UTI: Leukocytosis resolved.  Patient without urinary symptoms.  Urine cultures negative. 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  Code Status: Full code DVT Prophylaxis:   Subcu heparin 3 times daily Family Communication:  Daughter at bedside all questions answered to her satisfaction.  Disposition Plan:  Possible discharge tomorrow 11/08/2018 with home health PT and RN or when cardiology signs off.   Procedures:  Cardiac cath  LV end diastolic pressure is mildly elevated.  Ost Ramus lesion is 70% stenosed -relatively small caliber vessel. Not likely because of resting chest pain and ST elevations.  Ost 1st Diag lesion is 60% stenosed. -Not likely flow-limiting.  Very small caliber (<33mm) 1st Mrg lesion appears to be 100% stenosed with faint collateralization. Too small for angioplasty or stenting.     Objective: Vitals:   11/08/18 0609 11/08/18 0727 11/08/18 1410 11/08/18 1633  BP:  (!) 145/79 (!) 179/105 (!) 157/95  Pulse:   62 61  Resp:      Temp:   98.2 F (36.8 C)   TempSrc:   Oral   SpO2:   96%   Weight: 84.9 kg     Height:        Intake/Output Summary (Last 24 hours) at 11/08/2018 1830 Last data filed at  11/08/2018 0800 Gross per 24 hour  Intake 1549.95 ml  Output -  Net 1549.95 ml   Filed Weights   11/06/18 0555 11/07/18 0418 11/08/18 0609  Weight: 83.7 kg 83.7 kg 84.9 kg    Exam:  . General: 67 y.o. year-old female Well developed well-nourished appears uncomfortable due to nausea.  Alert and oriented x3. . Cardiovascular: Regular rate and rhythm with no rubs or gallops.  No JVD or thyromegaly noted. Marland Kitchen Respiratory: Clear to auscultation with no wheezes or rales.  Good respiratory effort.   . Abdomen: Soft nontender nondistended with normal bowel sounds x4 quadrant. . Musculoskeletal: No lower extremity edema. 2/4 pulses in all 4 extremities. . Skin: No ulcerative lesions noted or rashes, . Psychiatry: Mood is appropriate for condition and setting   Data Reviewed: CBC: Recent Labs  Lab 11/02/18 0333 11/03/18 0430 11/04/18 0418 11/06/18 0413  WBC 6.9 7.2 8.1 10.1  HGB 13.3 14.1 15.1* 15.0  HCT 40.5 42.4 44.7 45.7  MCV 89.6 87.8 87.6 87.9  PLT 162 177 186 240   Basic Metabolic Panel: Recent Labs  Lab 11/04/18 0418 11/04/18 1003 11/05/18 0343 11/06/18 0727 11/07/18 0309 11/08/18 0353  NA 135  --  135 134* 133* 136  K 3.5  --  3.9 3.9 4.2 4.2  CL 104  --  106 104 101 105  CO2 23  --  23 22 21* 24  GLUCOSE 136*  --  106* 153* 130* 111*  BUN 17  --  16 18  23 22  CREATININE 1.85*  --  1.85* 1.96* 2.10* 1.98*  CALCIUM 11.7* 11.7* 11.8* 12.1* 11.9* 11.5*   GFR: Estimated Creatinine Clearance: 28.5 mL/min (A) (by C-G formula based on SCr of 1.98 mg/dL (H)). Liver Function Tests: Recent Labs  Lab 11/05/18 0343  AST 27  ALT 51*  ALKPHOS 116  BILITOT 0.5  PROT 6.6  ALBUMIN 3.5   No results for input(s): LIPASE, AMYLASE in the last 168 hours. No results for input(s): AMMONIA in the last 168 hours. Coagulation Profile: No results for input(s): INR, PROTIME in the last 168 hours. Cardiac Enzymes: Recent Labs  Lab 11/05/18 1031 11/05/18 1525 11/05/18 2215    TROPONINI 0.06* 0.05* 0.06*   BNP (last 3 results) No results for input(s): PROBNP in the last 8760 hours. HbA1C: No results for input(s): HGBA1C in the last 72 hours. CBG: Recent Labs  Lab 11/07/18 1629 11/07/18 2122 11/08/18 0729 11/08/18 1126 11/08/18 1620  GLUCAP 123* 132* 153* 135* 138*   Lipid Profile: No results for input(s): CHOL, HDL, LDLCALC, TRIG, CHOLHDL, LDLDIRECT in the last 72 hours. Thyroid Function Tests: No results for input(s): TSH, T4TOTAL, FREET4, T3FREE, THYROIDAB in the last 72 hours. Anemia Panel: No results for input(s): VITAMINB12, FOLATE, FERRITIN, TIBC, IRON, RETICCTPCT in the last 72 hours. Urine analysis:    Component Value Date/Time   COLORURINE YELLOW 10/31/2018 1751   APPEARANCEUR CLOUDY (A) 10/31/2018 1751   LABSPEC 1.026 10/31/2018 1751   PHURINE 5.0 10/31/2018 1751   GLUCOSEU NEGATIVE 10/31/2018 1751   HGBUR SMALL (A) 10/31/2018 1751   BILIRUBINUR NEGATIVE 10/31/2018 1751   KETONESUR NEGATIVE 10/31/2018 1751   PROTEINUR 30 (A) 10/31/2018 1751   NITRITE NEGATIVE 10/31/2018 1751   LEUKOCYTESUR LARGE (A) 10/31/2018 1751   Sepsis Labs: @LABRCNTIP (procalcitonin:4,lacticidven:4)  ) Recent Results (from the past 240 hour(s))  MRSA PCR Screening     Status: None   Collection Time: 10/31/18  2:24 PM  Result Value Ref Range Status   MRSA by PCR NEGATIVE NEGATIVE Final    Comment:        The GeneXpert MRSA Assay (FDA approved for NASAL specimens only), is one component of a comprehensive MRSA colonization surveillance program. It is not intended to diagnose MRSA infection nor to guide or monitor treatment for MRSA infections. Performed at [facility], [place] [address], [place], [place] [address]   Urine Culture     Status: None   Collection Time: 11/02/18  3:21 PM  Result Value Ref Range Status   Specimen Description URINE, RANDOM  Final   Special Requests NONE  Final   Culture   Final    NO GROWTH Performed at [facility], [place] [address], [place], [place] [address]    Report Status 11/03/2018 FINAL  Final      Studies: No results found.  Scheduled Meds: . amLODipine  10 mg Oral Daily  . aspirin  81 mg Oral Daily  . carvedilol  12.5 mg Oral BID WC  . heparin injection (subcutaneous)  5,000 Units Subcutaneous Q8H  . hydrALAZINE  25 mg Oral Q8H  . insulin aspart  0-20 Units Subcutaneous TID WC  . insulin aspart  0-5 Units Subcutaneous QHS  . insulin aspart protamine- aspart  12 Units Subcutaneous BID WC  . metoCLOPramide (REGLAN) injection  5 mg Intravenous Q6H  . pantoprazole  40 mg Oral Daily  . pneumococcal 23 valent vaccine  0.5 mL Intramuscular Tomorrow-1000  . polyethylene glycol  17 g Oral TID  . potassium chloride  40 mEq Oral Daily  . rosuvastatin  5 mg Oral q1800  . senna-docusate  2 tablet Oral BID  . sodium chloride flush  3 mL Intravenous Q12H    Continuous Infusions: . sodium chloride 10 mL/hr at 11/06/18 0300  . sodium chloride 75 mL/hr at 11/07/18 2153     LOS: 8 days     Kayleen Memos, MD [facility] Pager [phone]  If 7PM-7AM, please contact night-coverage [email or web address] Password [email or web address] 11/08/2018, 6:30 PM

## 2018-11-08 NOTE — Progress Notes (Signed)
Progress Note  Patient Name: Melissa Lopez Date of Encounter: 11/08/2018  Primary Cardiologist: [id number], [place] [phone])  Subjective   Had episode of nausea and emesis this morning.  Was able to eat small amounts yesterday without incident however.  No chest pain or breathlessness at rest.  Inpatient Medications    Scheduled Meds: . amLODipine  10 mg Oral Daily  . aspirin  81 mg Oral Daily  . carvedilol  12.5 mg Oral BID WC  . heparin injection (subcutaneous)  5,000 Units Subcutaneous Q8H  . hydrALAZINE  25 mg Oral Q8H  . insulin aspart  0-20 Units Subcutaneous TID WC  . insulin aspart  0-5 Units Subcutaneous QHS  . insulin aspart protamine- aspart  12 Units Subcutaneous BID WC  . pantoprazole  40 mg Oral Daily  . pneumococcal 23 valent vaccine  0.5 mL Intramuscular Tomorrow-1000  . polyethylene glycol  17 g Oral TID  . potassium chloride  40 mEq Oral Daily  . senna-docusate  2 tablet Oral BID  . sodium chloride flush  3 mL Intravenous Q12H   Continuous Infusions: . sodium chloride 10 mL/hr at 11/06/18 0300  . sodium chloride 75 mL/hr at 11/07/18 2153   PRN Meds: sodium chloride, albuterol, bisacodyl, morphine injection, ondansetron (ZOFRAN) IV, sodium chloride flush   Vital Signs    Vitals:   11/07/18 1344 11/07/18 2122 11/08/18 0606 11/08/18 0609  BP: 128/64 133/76 (!) 181/92   Pulse: (!) 59 60 60   Resp:      Temp: 97.6 F (36.4 C) 97.9 F (36.6 C) 98 F (36.7 C)   TempSrc: Oral Oral Oral   SpO2: 93% 98% 98%   Weight:    84.9 kg  Height:        Intake/Output Summary (Last 24 hours) at 11/08/2018 0734 Last data filed at 11/08/2018 0600 Gross per 24 hour  Intake 824.95 ml  Output -  Net 824.95 ml   Filed Weights   11/06/18 0555 11/07/18 0418 11/08/18 0609  Weight: 83.7 kg 83.7 kg 84.9 kg    Telemetry    Atrial paced rhythm.  Personally reviewed.  ECG    Tracing from 11/08/2018 shows an atrial paced  rhythm with persistent ST segment elevation in the anterolateral leads.  Personally reviewed.   Physical Exam   GEN:  Obese woman, no acute distress.   Neck: No JVD. Cardiac: RRR, soft systolic murmur, no gallop.  Respiratory: Nonlabored. Clear to auscultation bilaterally. GI:  Obese, nontender, bowel sounds present. MS: No edema; No deformity. Neuro:  Nonfocal. Psych: Alert and oriented x 3. Normal affect.  Labs    Chemistry Recent Labs  Lab 11/05/18 0343 11/06/18 0727 11/07/18 0309 11/08/18 0353  NA 135 134* 133* 136  K 3.9 3.9 4.2 4.2  CL 106 104 101 105  CO2 23 22 21* 24  GLUCOSE 106* 153* 130* 111*  BUN 16 18 23 22   CREATININE 1.85* 1.96* 2.10* 1.98*  CALCIUM 11.8* 12.1* 11.9* 11.5*  PROT 6.6  --   --   --   ALBUMIN 3.5  --   --   --   AST 27  --   --   --   ALT 51*  --   --   --   ALKPHOS 116  --   --   --   BILITOT 0.5  --   --   --   GFRNONAA 28* 26* 24* 26*  GFRAA 32* 30*  28* 30*  ANIONGAP 6 8 11 7      Hematology Recent Labs  Lab 11/03/18 0430 11/04/18 0418 11/06/18 0413  WBC 7.2 8.1 10.1  RBC 4.83 5.10 5.20*  HGB 14.1 15.1* 15.0  HCT 42.4 44.7 45.7  MCV 87.8 87.6 87.9  MCH 29.2 29.6 28.8  MCHC 33.3 33.8 32.8  RDW 11.9 12.1 11.9  PLT 177 186 218    Cardiac Enzymes Recent Labs  Lab 11/05/18 1031 11/05/18 1525 11/05/18 2215  TROPONINI 0.06* 0.05* 0.06*   No results for input(s): TROPIPOC in the last 168 hours.   Radiology    No results found.  Cardiac Studies   Cardiac catheterization 84/66/5993:  LV end diastolic pressure is mildly elevated.  Ost Ramus lesion is 70% stenosed -relatively small caliber vessel. Not likely because of resting chest pain and ST elevations.  Ost 1st Diag lesion is 60% stenosed. -Not likely flow-limiting.  Very small caliber (<37mm) 1st Mrg lesion appears to be 100% stenosed with faint collateralization. Too small for angioplasty or stenting.  SUMMARY:  Angiographically mild diffuse disease, only  potentially occluded vessels are very very small branch of the major obtuse marginal. Not likely a culprit lesion for significant anginal chest pain.  Mild to moderately elevated LVEDP. LV gram not performed due to renal insufficiency.  Echocardiogram 10/31/2018: Study Conclusions  - Left ventricle: The cavity size was normal. Wall thickness was increased in a pattern of severe LVH. Systolic function was normal. The estimated ejection fraction was in the range of 55% to 60%. There is hypokinesis of the apical myocardium. Doppler parameters are consistent with abnormal left ventricular relaxation (grade 1 diastolic dysfunction). - Pericardium, extracardiac: A trivial pericardial effusion was identified.  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  Patient Profile     67 y.o. female with a history of [employer] pacemaker for treatment of apparent symptomatic bradycardia, CKD stage III, hypertension, and type 2 diabetes mellitus with nephropathy, currently admitted after recent reported syncopal events, also nausea and vomiting as well as general malaise and chest pain.  Cardiac catheterization demonstrated branch vessel disease without clear culprit and LVEF 55 to 60% with apical hypokinesis.  Equal therapy plan from a cardiac perspective.  Assessment & Plan    1..  History of chest pain with minimally elevated troponin I levels in flat pattern not consistent with definitive ACS.  Cardiac catheterization demonstrates a branch vessel disease with no obvious culprit lesion and plan is for medical therapy.  2.  History of syncope.  No obvious arrhythmic etiology.  Could be a contributor from vertigo based on PT evaluation, also autonomic dysfunction with type 2 diabetes  mellitus.  3.  Essential hypertension, continues on Norvasc, hydralazine, and Coreg.  He is not on ARB or ACE inhibitor with renal insufficiency.  4.  CKD stage 3, creatinine 1.98.  5.  Recurring nausea and emesis, management per primary team.  Possible gastroparesis.  Overall stable from a cardiac perspective.  Continue aspirin, Norvasc, Coreg, and hydralazine.  Add low-dose statin therapy with vascular disease and type 2 diabetes mellitus, recent LDL ranging 64-94.  No further cardiac testing is planned for now.  Increase activity.  Signed, Rozann Lesches, MD  11/08/2018, 7:34 AM

## 2018-11-08 NOTE — Progress Notes (Signed)
The patient and daughter have been inquiring about what is the next step with her persistent nausea and vomiting. Pt has spent most of my shift vomiting small amount of  Liquid, color depending on the fluids that she has attempted to drink. She has been selective of the medications she is willing to take. She was hypertensive this afternoon. I premedicated her with Zofran to give her BP medications. Will follow up and recheck BP to make sure she responds to BP medications. I called Dr. Nevada Crane to make her aware of above events. She stated she would reconsult GI. Will continue to monitor

## 2018-11-09 DIAGNOSIS — E440 Moderate protein-calorie malnutrition: Secondary | ICD-10-CM

## 2018-11-09 LAB — GLUCOSE, CAPILLARY
GLUCOSE-CAPILLARY: 171 mg/dL — AB (ref 70–99)
Glucose-Capillary: 161 mg/dL — ABNORMAL HIGH (ref 70–99)
Glucose-Capillary: 102 mg/dL — ABNORMAL HIGH (ref 70–99)
Glucose-Capillary: 76 mg/dL (ref 70–99)

## 2018-11-09 MED ORDER — BOOST / RESOURCE BREEZE PO LIQD CUSTOM
1.0000 | Freq: Three times a day (TID) | ORAL | Status: DC
  Administered 2018-11-09 – 2018-11-11 (×6): 1 via ORAL

## 2018-11-09 NOTE — Progress Notes (Signed)
[facility] Gastroenterology Progress Note  Melissa Lopez 67 y.o. November 26, 1951   Subjective: Resting comfortably. Denies N/V/abdominal pain now but reports having N/V yesterday while off Reglan. Doing better back on Reglan.  Objective: Vital signs: Vitals:   11/09/18 0553 11/09/18 0811  BP: (!) 172/95 (!) 158/90  Pulse: 67 78  Resp: (!) 24   Temp: 97.8 F (36.6 C)   SpO2: 96%     Physical Exam: Gen: lethargic, obese, no acute distress  HEENT: anicteric sclera CV: RRR Chest: CTA B Abd: soft, nontender, nondistended, +BS Ext: no edema  Lab Results: Recent Labs    11/07/18 0309 11/08/18 0353  NA 133* 136  K 4.2 4.2  CL 101 105  CO2 21* 24  GLUCOSE 130* 111*  BUN 23 22  CREATININE 2.10* 1.98*  CALCIUM 11.9* 11.5*   No results for input(s): AST, ALT, ALKPHOS, BILITOT, PROT, ALBUMIN in the last 72 hours. No results for input(s): WBC, NEUTROABS, HGB, HCT, MCV, PLT in the last 72 hours.    Assessment/Plan: Recurrent nausea and vomiting of unclear etiology but suspect gastroparesis vs marijuana use. Needs EGD to evaluate for peptic ulcer disease or other source of possible gastric outlet obstruction. NPO p MN for EGD tomorrow. Clear liquid diet for dinner. Supportive care.  [name] 11/09/2018, 10:31 AM  Questions please call [phone] ID: [name], female   DOB: Feb 15, 1951, 67 y.o.   MRN: [id number]

## 2018-11-09 NOTE — Progress Notes (Addendum)
Initial Nutrition Assessment  DOCUMENTATION CODES:   Non-severe (moderate) malnutrition in context of acute illness/injury, Obesity unspecified  INTERVENTION:    Boost Breeze po TID, each supplement provides 250 kcal and 9 grams of protein  Gastroparesis diet education provided to patient's daughter  NUTRITION DIAGNOSIS:   Moderate Malnutrition related to acute illness(suspected gastroparesis) as evidenced by energy intake < 75% for > 7 days, mild muscle depletion, edema.  GOAL:   Patient will meet greater than or equal to 90% of their needs  MONITOR:   PO intake, Supplement acceptance, Labs, Skin  REASON FOR ASSESSMENT:   Malnutrition Screening Tool    ASSESSMENT:   67 yo female with PMH of CAD, HTN, pacemaker, CKD, DM-2 who was admitted on 11/23 with chest pain and syncope.   S/P cardiac cath, results were WNL.  Reglan resumed yesterday as patient had N/V off Reglan.  Plans for EGD tomorrow to evaluate for PUD or other source of possible GOO.  Currently on a soft diet. Patient has had a poor appetite r/t stomach discomfort since admission. Intake of meals has been poor per discussion with patient's daughter. Suspect intake has been meeting < 75% of energy requirement for > 7 days (9 days since admission).   Suspected gastroparesis noted. Daughter asked many questions regarding diet guidelines for gastroparesis. Provided "Gastroparesis Nutrition Therapy" handout from the Academy of Nutrition and Dietetics. Reviewed general guidelines. Discussed decreasing fiber and fat intake and eating small, frequent meals.   Labs and medications reviewed.   NUTRITION - FOCUSED PHYSICAL EXAM:    Most Recent Value  Orbital Region  No depletion  Upper Arm Region  No depletion  Thoracic and Lumbar Region  No depletion  Buccal Region  No depletion  Temple Region  Mild depletion  Clavicle Bone Region  Mild depletion  Clavicle and Acromion Bone Region  No depletion  Scapular Bone  Region  No depletion  Dorsal Hand  No depletion  Patellar Region  No depletion  Anterior Thigh Region  No depletion  Posterior Calf Region  Mild depletion  Edema (RD Assessment)  Mild  Hair  Reviewed  Eyes  Reviewed  Mouth  Reviewed  Skin  Reviewed  Nails  Reviewed       Diet Order:   Diet Order            Diet NPO time specified  Diet effective midnight        Diet clear liquid Room service appropriate? Yes; Fluid consistency: Thin  Diet effective 1400              EDUCATION NEEDS:   Education needs have been addressed  Skin:  Skin Assessment: Reviewed RN Assessment  Last BM:  12/1  Height:   Ht Readings from Last 1 Encounters:  10/31/18 5\' 3"  (1.6 m)    Weight:   Wt Readings from Last 1 Encounters:  11/09/18 83.6 kg    Ideal Body Weight:  52.3 kg  BMI:  Body mass index is 32.63 kg/m.  Estimated Nutritional Needs:   Kcal:  1600-1800  Protein:  80-100 gm  Fluid:  1.8 L    Molli Barrows, RD, LDN, [place] Pager [phone] After Hours Pager [phone]

## 2018-11-09 NOTE — Progress Notes (Signed)
PROGRESS NOTE  Melissa Lopez JEH:[id number] DOB: 1951-03-04 DOA: 10/31/2018 PCP: No primary care provider on file.  HPI/Recap of past 24 hours: Melissa Lopez an 67 y.o.femalewith history of type 2 diabetes mellitus with diabetic neuropathy, chronic kidney disease? Stage II, hypertension, CAD, arthritis requiring bilateral knee replacement presented as code STEMI after a near syncopal event at home. Patient admitted to cardiology service and underwent emergent cardiac cath and concern for EKG changes and abnormal troponin. Cardiac cath was unremarkable and did not explain her symptoms. Medicine service consulted for further evaluation.Patient has had dizzy spells, described as lightheaded feeling, for a long time and mostly on walking. She has been walker dependent at baseline due to bilateral arthritis and ataxia/periodic dizziness. The last episode was also associated with some chest discomfort. 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 She takes PPI at baseline.She has chronic exertional dyspnea with no new changes.Labs on presentation showed potassium of 2.2, creatinine of 2.7 (repeat level at 3.8), troponin of 0.33 and EKG showedwide-complex paced rhythm with ST changes.  11/05/2018:  Chest pain.  Twelve-lead EKG unremarkable.  Troponin unremarkable.   11/06/2018: Tolerating clear liquid diet.   11/07/2018: Patient seen and examined at bedside.  Daughter is present.  She had several bowel movements last night.  States she feels better.  GI signed off on 11/07/2018.  11/08/2018: Persistent nausea and vomiting off Reglan.  Will restart IV Reglan 5 mg every 6 hours.  11/09/2018: Patient seen and examined with her daughter at bedside.  Reports mild nausea with no vomiting this morning back on Reglan.  Denies any cardiopulmonary symptoms.  Assessment/Plan: Principal  Problem:   Chest pain with high risk of acute coronary syndrome Active Problems:   H/O cardiac pacemaker   Syncope   ST elevation - with non-obstructive CAD   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HCC)   Type 2 diabetes mellitus with complication, with long-term current use of insulin (HCC)   CKD (chronic kidney disease) stage 3, GFR 30-59 ml/min (HCC) - per patient report   Essential hypertension   AKI (acute kidney injury) ([place])   PE (pulmonary thromboembolism) ([place])   Malnutrition of moderate degree   Chest pain: LHC on 10/31/2018 with mild diffuse disease.  -Cardiology would like to treat conservatively  -On carvedilol 12.5 mg twice daily -Troponin flat peaked at 0.46 and trended down -Cardiac medications include Coreg 12.5 mg twice daily -No addition of ACE inhibitor due to renal insufficiency -Cardiology signed off, will follow-up outpatient.  Intractable nausea with high suspicion for gastroparesis  Currently on Reglan GI following Plan for endoscopy in the morning to rule out peptic ulcer disease Liquid diet today N.p.o. after midnight  Syncope/near syncope, falls: Orthostatic vitals, VQ scan, cardiac cath and cortisol level not impressive.  Patient has a pacemaker placed in [place].  Concern for right BPPV per PT. Also concern for autonomic dysfunction in the setting of diabetes although this seems to be fairly controlled. -May need outpatient vestibular rehab for BPPV -PT assessed and recommended home health PT -Fall precautions  Diastolic CHF: Echocardiogram on 10/31/2018 with EF of 50 to 60%,, severe LVH, hypokinesis of apical myocardium and G1DD.  Currently without cardiopulmonary symptoms.  Appears euvolemic. -Continue daily weight, intake output and renal function.  Type 2 diabetes mellitus, IDDM : A1c 7.0% -Continue CBG monitoring -Continue NovoLog 70/30 20 units twice a day  Mild leukocytosis, UTI: Leukocytosis resolved.  Patient without  urinary symptoms.  Urine cultures negative. 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  Code Status: Full code DVT Prophylaxis:   Subcu heparin 3 times daily Family Communication:  Daughter at bedside all questions answered to her satisfaction.  Disposition Plan:  Discharge to home with home health care when GI signs off.  Procedures:  Cardiac cath  LV end diastolic pressure is mildly elevated.  Ost Ramus lesion is 70% stenosed -relatively small caliber vessel. Not likely because of resting chest pain and ST elevations.  Ost 1st Diag lesion is 60% stenosed. -Not likely flow-limiting.  Very small caliber (<17mm) 1st Mrg lesion appears to be 100% stenosed with faint collateralization. Too small for angioplasty or stenting.     Objective: Vitals:   11/09/18 0507 11/09/18 0553 11/09/18 0811 11/09/18 1359  BP: (!) 160/106 (!) 172/95 (!) 158/90 (!) 179/97  Pulse:  67 78 (!) 59  Resp:  (!) 24  19  Temp:  97.8 F (36.6 C)  98.5 F (36.9 C)  TempSrc:  Oral  Oral  SpO2:  96%  97%  Weight:  83.6 kg    Height:        Intake/Output Summary (Last 24 hours) at 11/09/2018 1423 Last data filed at 11/09/2018 1200 Gross per 24 hour  Intake 2674.91 ml  Output -  Net 2674.91 ml   Filed Weights   11/07/18 0418 11/08/18 0609 11/09/18 0553  Weight: 83.7 kg 84.9 kg 83.6 kg     Exam:  . General: 67 y.o. year-old female obese in no acute distress.  Alert oriented x3.   . Cardiovascular: Regular rate and rhythm with no rubs or gallops.  No JVD or thyromegaly noted.   Marland Kitchen Respiratory: Clear to auscultation with no wheezes or rales.  Good inspiratory effort.  . Abdomen: Soft nontender nondistended with normal bowel sounds x4 quadrant. . Musculoskeletal: No lower extremity edema. 2/4 pulses in all 4 extremities. . Skin: No ulcerative lesions noted or rashes, . Psychiatry: Mood is appropriate for condition and setting   Data Reviewed: CBC: Recent Labs  Lab 11/03/18 0430 11/04/18 0418 11/06/18 0413  WBC 7.2 8.1 10.1  HGB 14.1 15.1* 15.0  HCT 42.4 44.7 45.7  MCV 87.8 87.6 87.9  PLT 177 186 480   Basic Metabolic Panel: Recent Labs  Lab 11/04/18 0418 11/04/18 1003 11/05/18 0343 11/06/18 0727 11/07/18 0309 11/08/18 0353  NA 135  --  135 134* 133* 136  K 3.5  --  3.9 3.9 4.2 4.2  CL 104  --  106 104 101 105  CO2 23  --  23 22 21* 24  GLUCOSE 136*  --  106* 153* 130* 111*  BUN 17  --  16 18 23 22   CREATININE 1.85*  --  1.85* 1.96* 2.10* 1.98*  CALCIUM 11.7* 11.7* 11.8* 12.1* 11.9* 11.5*   GFR: Estimated Creatinine Clearance: 28.2 mL/min (A) (by C-G formula based on SCr of 1.98 mg/dL (H)). Liver Function Tests: Recent Labs  Lab 11/05/18 0343  AST 27  ALT 51*  ALKPHOS 116  BILITOT 0.5  PROT 6.6  ALBUMIN 3.5   No results for input(s): LIPASE, AMYLASE in the last 168 hours. No results for input(s): AMMONIA in the last 168 hours. Coagulation Profile: No results for input(s): INR, PROTIME in the last 168 hours. Cardiac Enzymes: Recent Labs  Lab 11/05/18 1031 11/05/18 1525 11/05/18 2215  TROPONINI 0.06* 0.05* 0.06*   BNP (last 3 results) No results for input(s): PROBNP in the last 8760 hours. HbA1C: No results for input(s): HGBA1C in the last 72 hours. CBG: Recent Labs  Lab 11/08/18 1126 11/08/18 1620 11/08/18 2134 11/09/18 0749  11/09/18 1138  GLUCAP 135* 138* 134* 161* 102*   Lipid Profile: No results for input(s): CHOL, HDL, LDLCALC, TRIG, CHOLHDL, LDLDIRECT in the last 72 hours. Thyroid Function Tests: No results for input(s): TSH, T4TOTAL, FREET4, T3FREE, THYROIDAB in the last 72 hours. Anemia Panel: No results for input(s): VITAMINB12, FOLATE, FERRITIN, TIBC, IRON, RETICCTPCT in the last 72 hours. Urine analysis:    Component Value Date/Time   COLORURINE YELLOW 10/31/2018 1751   APPEARANCEUR CLOUDY (A) 10/31/2018 1751   LABSPEC 1.026 10/31/2018 1751   PHURINE 5.0 10/31/2018 1751   GLUCOSEU NEGATIVE 10/31/2018 1751   HGBUR SMALL (A) 10/31/2018 1751   BILIRUBINUR NEGATIVE 10/31/2018 1751   KETONESUR NEGATIVE 10/31/2018 1751   PROTEINUR 30 (A) 10/31/2018 1751   NITRITE NEGATIVE 10/31/2018 1751   LEUKOCYTESUR LARGE (A) 10/31/2018 1751   Sepsis Labs: @LABRCNTIP (procalcitonin:4,lacticidven:4)  ) Recent Results (from the past 240 hour(s))  MRSA PCR Screening     Status: None   Collection Time: 10/31/18  2:24 PM  Result Value Ref Range Status   MRSA by PCR NEGATIVE NEGATIVE Final    Comment:        The GeneXpert MRSA Assay (FDA approved for NASAL specimens only), is one component of a comprehensive MRSA colonization surveillance program. It is not intended to diagnose MRSA infection nor to guide or monitor treatment for MRSA infections. Performed at [facility], [place] [address] [place], [place] [address]   Urine Culture     Status: None   Collection Time: 11/02/18  3:21 PM  Result Value Ref Range Status   Specimen Description URINE, RANDOM  Final   Special Requests NONE  Final   Culture   Final    NO GROWTH Performed at [facility], [place] [address] [place], [place] [address]    Report Status 11/03/2018 FINAL  Final      Studies: No results found.  Scheduled Meds: . amLODipine  10 mg Oral Daily  . aspirin  81 mg Oral Daily  . carvedilol  12.5 mg Oral BID WC  .  feeding supplement  1 Container Oral TID BM  . heparin injection (subcutaneous)  5,000 Units Subcutaneous Q8H  . hydrALAZINE  25 mg Oral Q8H  . insulin aspart  0-20 Units Subcutaneous TID WC  . insulin aspart  0-5 Units Subcutaneous QHS  . insulin aspart protamine- aspart  12 Units Subcutaneous BID WC  . metoCLOPramide (REGLAN) injection  5 mg Intravenous Q6H  . pantoprazole  40 mg Oral Daily  . pneumococcal 23 valent vaccine  0.5 mL Intramuscular Tomorrow-1000  . polyethylene glycol  17 g Oral TID  . potassium chloride  40 mEq Oral Daily  . rosuvastatin  5 mg Oral q1800  . senna-docusate  2 tablet Oral BID  . sodium chloride flush  3  mL Intravenous Q12H    Continuous Infusions: . sodium chloride 75 mL/hr at 11/07/18 0923  . sodium chloride 75 mL/hr at 11/09/18 1200     LOS: 9 days     Kayleen Memos, MD [facility] Pager [phone]  If 7PM-7AM, please contact night-coverage [email or web address] Password [facility] 11/09/2018, 2:23 PM

## 2018-11-09 NOTE — H&P (View-Only) (Signed)
[facility] Gastroenterology Progress Note  Melissa Lopez 67 y.o. 05/16/1951   Subjective: Resting comfortably. Denies N/V/abdominal pain now but reports having N/V yesterday while off Reglan. Doing better back on Reglan.  Objective: Vital signs: Vitals:   11/09/18 0553 11/09/18 0811  BP: (!) 172/95 (!) 158/90  Pulse: 67 78  Resp: (!) 24   Temp: 97.8 F (36.6 C)   SpO2: 96%     Physical Exam: Gen: lethargic, obese, no acute distress  HEENT: anicteric sclera CV: RRR Chest: CTA B Abd: soft, nontender, nondistended, +BS Ext: no edema  Lab Results: Recent Labs    11/07/18 0309 11/08/18 0353  NA 133* 136  K 4.2 4.2  CL 101 105  CO2 21* 24  GLUCOSE 130* 111*  BUN 23 22  CREATININE 2.10* 1.98*  CALCIUM 11.9* 11.5*   No results for input(s): AST, ALT, ALKPHOS, BILITOT, PROT, ALBUMIN in the last 72 hours. No results for input(s): WBC, NEUTROABS, HGB, HCT, MCV, PLT in the last 72 hours.    Assessment/Plan: Recurrent nausea and vomiting of unclear etiology but suspect gastroparesis vs marijuana use. Needs EGD to evaluate for peptic ulcer disease or other source of possible gastric outlet obstruction. NPO p MN for EGD tomorrow. Clear liquid diet for dinner. Supportive care.  Melissa Lopez 11/09/2018, 10:31 AM  Questions please call [phone] ID: Melissa Lopez, female   DOB: 11/14/1951, 67 y.o.   MRN: [id number]

## 2018-11-09 NOTE — Progress Notes (Addendum)
No vomiting episodes overnight and reports no nausea currently however appears to get nauseous after returning to bed from using the bathroom. Will continue to monitor.

## 2018-11-09 NOTE — Progress Notes (Signed)
PT Cancellation Note  Patient Details Name: Melissa Lopez MRN: [id number] DOB: December 14, 1950   Cancelled Treatment:    Reason Eval/Treat Not Completed: Other (comment)(refused due to nausea. Nursing made aware. )   Denice Paradise 11/09/2018, 3:26 PM Lashane Whelpley,PT Acute Rehabilitation Services Pager:  [phone]  Office:  [phone]

## 2018-11-09 NOTE — Care Management Important Message (Signed)
Important Message  Patient Details  Name: Melissa Lopez MRN: [id number] Date of Birth: July 05, 1951   Medicare Important Message Given:  Yes    Detrell Umscheid P Thera Basden 11/09/2018, 1:32 PM

## 2018-11-10 ENCOUNTER — Inpatient Hospital Stay (HOSPITAL_COMMUNITY): Payer: Medicare Other | Admitting: Certified Registered Nurse Anesthetist

## 2018-11-10 ENCOUNTER — Encounter (HOSPITAL_COMMUNITY)
Admission: EM | Disposition: A | Discharge status: Home Health | Payer: Self-pay | Source: Home / Self Care | Attending: Internal Medicine

## 2018-11-10 DIAGNOSIS — R112 Nausea with vomiting, unspecified: Secondary | ICD-10-CM | POA: Diagnosis present

## 2018-11-10 HISTORY — PX: ESOPHAGOGASTRODUODENOSCOPY (EGD) WITH PROPOFOL: SHX5813

## 2018-11-10 LAB — BASIC METABOLIC PANEL
Anion gap: 10 (ref 5–15)
BUN: 17 mg/dL (ref 8–23)
CO2: 22 mmol/L (ref 22–32)
CREATININE: 1.85 mg/dL — AB (ref 0.44–1.00)
Calcium: 11.8 mg/dL — ABNORMAL HIGH (ref 8.9–10.3)
Chloride: 102 mmol/L (ref 98–111)
GFR calc Af Amer: 32 mL/min — ABNORMAL LOW (ref >60)
GFR, EST NON AFRICAN AMERICAN: 28 mL/min — AB (ref >60)
Glucose, Bld: 142 mg/dL — ABNORMAL HIGH (ref 70–99)
Potassium: 3.6 mmol/L (ref 3.5–5.1)
Sodium: 134 mmol/L — ABNORMAL LOW (ref 135–145)

## 2018-11-10 LAB — GLUCOSE, CAPILLARY
Glucose-Capillary: 113 mg/dL — ABNORMAL HIGH (ref 70–99)
Glucose-Capillary: 122 mg/dL — ABNORMAL HIGH (ref 70–99)
Glucose-Capillary: 130 mg/dL — ABNORMAL HIGH (ref 70–99)
Glucose-Capillary: 137 mg/dL — ABNORMAL HIGH (ref 70–99)
Glucose-Capillary: 140 mg/dL — ABNORMAL HIGH (ref 70–99)
Glucose-Capillary: 160 mg/dL — ABNORMAL HIGH (ref 70–99)

## 2018-11-10 LAB — CBC
HCT: 45.4 % (ref 36.0–46.0)
Hemoglobin: 14.9 g/dL (ref 12.0–15.0)
MCH: 28.9 pg (ref 26.0–34.0)
MCHC: 32.8 g/dL (ref 30.0–36.0)
MCV: 88 fL (ref 80.0–100.0)
PLATELETS: 241 10*3/uL (ref 150–400)
RBC: 5.16 MIL/uL — ABNORMAL HIGH (ref 3.87–5.11)
RDW: 11.9 % (ref 11.5–15.5)
WBC: 9.7 10*3/uL (ref 4.0–10.5)
nRBC: 0 % (ref 0.0–0.2)

## 2018-11-10 SURGERY — ESOPHAGOGASTRODUODENOSCOPY (EGD) WITH PROPOFOL
Anesthesia: Monitor Anesthesia Care

## 2018-11-10 MED ORDER — HYDRALAZINE HCL 50 MG PO TABS
50.0000 mg | ORAL_TABLET | Freq: Three times a day (TID) | ORAL | Status: DC
  Administered 2018-11-10 – 2018-11-12 (×6): 50 mg via ORAL
  Filled 2018-11-10 (×6): qty 1

## 2018-11-10 MED ORDER — LACTATED RINGERS IV SOLN: INTRAVENOUS | Status: DC

## 2018-11-10 MED ORDER — PROPOFOL 500 MG/50ML IV EMUL
INTRAVENOUS | Status: DC | PRN
  Administered 2018-11-10: 100 ug/kg/min via INTRAVENOUS

## 2018-11-10 MED ORDER — PANTOPRAZOLE SODIUM 40 MG PO TBEC
40.0000 mg | DELAYED_RELEASE_TABLET | Freq: Two times a day (BID) | ORAL | Status: DC
  Administered 2018-11-10 – 2018-11-12 (×4): 40 mg via ORAL
  Filled 2018-11-10 (×4): qty 1

## 2018-11-10 MED ORDER — ACETAMINOPHEN 325 MG PO TABS
650.0000 mg | ORAL_TABLET | Freq: Four times a day (QID) | ORAL | Status: DC | PRN
  Filled 2018-11-10: qty 2

## 2018-11-10 SURGICAL SUPPLY — 15 items

## 2018-11-10 NOTE — Anesthesia Postprocedure Evaluation (Signed)
Anesthesia Post Note  Patient: Melissa Lopez  Procedure(s) Performed: ESOPHAGOGASTRODUODENOSCOPY (EGD) WITH PROPOFOL (N/A )     Patient location during evaluation: PACU Anesthesia Type: MAC Level of consciousness: awake and alert Pain management: pain level controlled Vital Signs Assessment: post-procedure vital signs reviewed and stable Respiratory status: spontaneous breathing, nonlabored ventilation and respiratory function stable Cardiovascular status: blood pressure returned to baseline and stable Postop Assessment: no apparent nausea or vomiting Anesthetic complications: no    Last Vitals:  Vitals:   11/10/18 0906 11/10/18 0908  BP:  128/69  Pulse: 60 62  Resp: 13 19  Temp:    SpO2: 98%     Last Pain:  Vitals:   11/10/18 0859  TempSrc:   PainSc: 0-No pain                 Lynda Rainwater

## 2018-11-10 NOTE — Transfer of Care (Signed)
Immediate Anesthesia Transfer of Care Note  Patient: Melissa Lopez  Procedure(s) Performed: ESOPHAGOGASTRODUODENOSCOPY (EGD) WITH PROPOFOL (N/A )  Patient Location: PACU and Endoscopy Unit  Anesthesia Type:MAC  Level of Consciousness: patient cooperative and responds to stimulation  Airway & Oxygen Therapy: Patient Spontanous Breathing and Patient connected to nasal cannula oxygen  Post-op Assessment: Report given to RN and Post -op Vital signs reviewed and stable  Post vital signs: Reviewed and stable  Last Vitals:  Vitals Value Taken Time  BP 164/78 11/10/2018  8:46 AM  Temp    Pulse 60 11/10/2018  8:47 AM  Resp 23 11/10/2018  8:47 AM  SpO2 99 % 11/10/2018  8:47 AM  Vitals shown include unvalidated device data.  Last Pain:  Vitals:   11/10/18 0742  TempSrc: Oral  PainSc: 0-No pain      Patients Stated Pain Goal: 0 (03/00/92 3300)  Complications: No apparent anesthesia complications

## 2018-11-10 NOTE — Progress Notes (Signed)
PROGRESS NOTE  Melissa Lopez IOE:[id number] DOB: 1951/06/02 DOA: 10/31/2018 PCP: No primary care provider on file.  HPI/Recap of past 24 hours: Melissa Honeycuttis an 67 y.o.femalewith history of type 2 diabetes mellitus with diabetic neuropathy, chronic kidney disease? Stage II, hypertension, CAD, arthritis requiring bilateral knee replacement presented as code STEMI after a near syncopal event at home. Patient admitted to cardiology service and underwent emergent cardiac cath and concern for EKG changes and abnormal troponin. Cardiac cath was unremarkable and did not explain her symptoms. Medicine service consulted for further evaluation.Patient has had dizzy spells, described as lightheaded feeling, for a long time and mostly on walking. She has been walker dependent at baseline due to bilateral arthritis and ataxia/periodic dizziness. The last episode was also associated with some chest discomfort. 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 She takes PPI at baseline.She has chronic exertional dyspnea with no new changes.Labs on presentation showed potassium of 2.2, creatinine of 2.7 (repeat level at 3.8), troponin of 0.33 and EKG showedwide-complex paced rhythm with ST changes.  Hospital course complicated by intermittent chest pain.  Work-up unremarkable.  Also intractable nausea and vomiting despite Reglan infusions.  GI consulted and followed.  EGD done today revealed mild duodenitis and gastritis with no suspicion for peptic ulcers or gastric outlet obstruction.  Per GI high suspicion for gastroparesis and or marijuana use as source of nausea and vomiting.  Will need to establish with an endocrinologist for her diabetes and will need to stop smoking marijuana.   Per GI if further nausea and vomiting, recommends obtaining abdominal CT scan with contrast then follow-up with  GI Dr. Therisa Doyne in 3 to 6 weeks.  GI signed off on 11/10/2018.  11/10/2018: Patient seen and examined.  Reports intermittent nausea.  Constipation has resolved.  Continue to follow closely. .  Assessment/Plan: Principal Problem:   Chest pain with high risk of acute coronary syndrome Active Problems:   H/O cardiac pacemaker   Syncope   ST elevation - with non-obstructive CAD   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HCC)   Type 2 diabetes mellitus with complication, with long-term current use of insulin (HCC)   CKD (chronic kidney disease) stage 3, GFR 30-59 ml/min (HCC) - per patient report   Essential hypertension   AKI (acute kidney injury) ([place])   PE (pulmonary thromboembolism) (HCC)   Malnutrition of moderate degree   Nausea with vomiting   Chest pain: LHC on 10/31/2018 with mild diffuse disease.  -Cardiology would like to treat conservatively  -On carvedilol 12.5 mg twice daily -Troponin flat peaked at 0.46 and trended down -Cardiac medications include Coreg 12.5 mg twice daily -No addition of ACE inhibitor due to renal insufficiency -Cardiology signed off on 11/08/2018, will follow-up outpatient.  Intractable nausea with high suspicion for gastroparesis  GI following EGD revealed mild duodenitis and gastritis with no evidence of gastric outlet obstruction or peptic ulcers disease. Liquid diet as tolerated Advance diet as tolerated GI signed off on 11/10/2018 If nausea persist obtain abdominal CT scan Will need to follow-up with an endocrinologist and GI posthospitalization  Syncope/near syncope, falls: Orthostatic vitals, VQ scan, cardiac cath and cortisol level not impressive.  Patient has a pacemaker placed in [place].  Concern for right BPPV per PT. Also concern for autonomic dysfunction in the setting of diabetes although this seems to be fairly controlled. -May need outpatient vestibular rehab for BPPV -PT assessed and recommended  home health PT -Fall  precautions  Diastolic CHF: Echocardiogram on 10/31/2018 with EF of 50 to 60%,, severe LVH, hypokinesis of apical myocardium and G1DD.  Currently without cardiopulmonary symptoms.  Appears euvolemic. -Continue daily weight, intake output and renal function.  Type 2 diabetes mellitus, IDDM : A1c 7.0% -Continue CBG monitoring -Continue NovoLog 70/30 20 units twice a day  Mild leukocytosis, UTI: Leukocytosis resolved.  Patient without urinary symptoms.  Urine cultures negative. 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  Code Status: Full code DVT Prophylaxis:   Subcu heparin 3 times daily Family Communication:  Daughter at bedside all questions answered to her satisfaction.  Disposition Plan:  Discharge to home with home health care when GI signs off.  Procedures:  Cardiac cath  LV end diastolic pressure is mildly elevated.  Ost Ramus lesion is 70% stenosed -relatively small caliber vessel. Not likely because of resting chest pain and ST elevations.  Ost 1st Diag lesion is 60% stenosed. -Not likely flow-limiting.  Very small caliber (<30mm) 1st Mrg lesion appears to be 100% stenosed with faint collateralization. Too small for angioplasty or stenting.     Objective: Vitals:   11/10/18 0859 11/10/18 0900 11/10/18 0906 11/10/18 0908  BP: (!) 186/84   128/69  Pulse: 65 65 60 62    Resp: 16 16 13 19   Temp:      TempSrc:      SpO2: 100% 99% 98%   Weight:      Height:        Intake/Output Summary (Last 24 hours) at 11/10/2018 1454 Last data filed at 11/10/2018 1057 Gross per 24 hour  Intake 597 ml  Output 150 ml  Net 447 ml   Filed Weights   11/07/18 0418 11/08/18 0609 11/09/18 0553  Weight: 83.7 kg 84.9 kg 83.6 kg    Exam:  . General: 67 y.o. year-old female obese in NAD A&O x 3  . Cardiovascular: RRR no rubs or gallops. No JVD or thyromegaly.   Marland Kitchen Respiratory: CTA no wheezes or rales. Good inspiratory efforts.  . Abdomen: Soft nontender nondistended with normal bowel sounds x4 quadrant. . Musculoskeletal: No lower extremity edema. 2/4 pulses in all 4 extremities. Marland Kitchen Psychiatry: Mood is appropriate for condition and setting   Data Reviewed: CBC: Recent Labs  Lab 11/04/18 0418 11/06/18 0413 11/10/18 0627  WBC 8.1 10.1 9.7  HGB 15.1* 15.0 14.9  HCT 44.7 45.7 45.4  MCV 87.6 87.9 88.0  PLT 186 218 283   Basic Metabolic Panel: Recent Labs  Lab 11/05/18 0343 11/06/18 0727 11/07/18 0309 11/08/18 0353 11/10/18 0627  NA 135 134* 133* 136 134*  K 3.9 3.9 4.2 4.2 3.6  CL 106 104 101 105 102  CO2 23 22 21* 24 22  GLUCOSE 106* 153* 130* 111* 142*  BUN 16 18 23 22 17   CREATININE 1.85* 1.96* 2.10* 1.98* 1.85*  CALCIUM 11.8* 12.1* 11.9* 11.5* 11.8*   GFR: Estimated Creatinine Clearance: 30.2 mL/min (A) (by C-G formula  based on SCr of 1.85 mg/dL (H)). Liver Function Tests: Recent Labs  Lab 11/05/18 0343  AST 27  ALT 51*  ALKPHOS 116  BILITOT 0.5  PROT 6.6  ALBUMIN 3.5   No results for input(s): LIPASE, AMYLASE in the last 168 hours. No results for input(s): AMMONIA in the last 168 hours. Coagulation Profile: No results for input(s): INR, PROTIME in the last 168 hours. Cardiac Enzymes: Recent Labs  Lab 11/05/18 1031 11/05/18 1525 11/05/18 2215  TROPONINI 0.06* 0.05* 0.06*   BNP (last 3 results) No results for input(s): PROBNP in  the last 8760 hours. HbA1C: No results for input(s): HGBA1C in the last 72 hours. CBG: Recent Labs  Lab 11/09/18 2140 11/10/18 0001 11/10/18 0801 11/10/18 0939 11/10/18 1111  GLUCAP 76 140* 130* 137* 160*   Lipid Profile: No results for input(s): CHOL, HDL, LDLCALC, TRIG, CHOLHDL, LDLDIRECT in the last 72 hours. Thyroid Function Tests: No results for input(s): TSH, T4TOTAL, FREET4, T3FREE, THYROIDAB in the last 72 hours. Anemia Panel: No results for input(s): VITAMINB12, FOLATE, FERRITIN, TIBC, IRON, RETICCTPCT in the last 72 hours. Urine analysis:    Component Value Date/Time   COLORURINE YELLOW 10/31/2018 1751   APPEARANCEUR CLOUDY (A) 10/31/2018 1751   LABSPEC 1.026 10/31/2018 1751   PHURINE 5.0 10/31/2018 1751   GLUCOSEU NEGATIVE 10/31/2018 1751   HGBUR SMALL (A) 10/31/2018 1751   BILIRUBINUR NEGATIVE 10/31/2018 1751   KETONESUR NEGATIVE 10/31/2018 1751   PROTEINUR 30 (A) 10/31/2018 1751   NITRITE NEGATIVE 10/31/2018 1751   LEUKOCYTESUR LARGE (A) 10/31/2018 1751   Sepsis Labs: @LABRCNTIP (procalcitonin:4,lacticidven:4)  ) Recent Results (from the past 240 hour(s))  Urine Culture     Status: None   Collection Time: 11/02/18  3:21 PM  Result Value Ref Range Status   Specimen Description URINE, RANDOM  Final   Special Requests NONE  Final   Culture   Final    NO GROWTH Performed at [facility], [place] [address] [place], [place] [address]    Report Status 11/03/2018 FINAL  Final      Studies: No results found.  Scheduled Meds: . amLODipine  10 mg Oral Daily  . aspirin  81 mg Oral Daily  . carvedilol  12.5 mg Oral BID WC  . feeding supplement  1 Container Oral TID BM  . heparin injection (subcutaneous)  5,000 Units Subcutaneous Q8H  . hydrALAZINE  25 mg Oral Q8H  . insulin aspart  0-20 Units Subcutaneous TID WC  . insulin aspart  0-5 Units Subcutaneous QHS  . insulin aspart protamine- aspart  12 Units Subcutaneous BID WC  . metoCLOPramide (REGLAN)  injection  5 mg Intravenous Q6H  . pantoprazole  40 mg Oral BID  . pneumococcal 23 valent vaccine  0.5 mL Intramuscular Tomorrow-1000  . polyethylene glycol  17 g Oral TID  . potassium chloride  40 mEq Oral Daily  . rosuvastatin  5 mg Oral q1800  . senna-docusate  2 tablet Oral BID  . sodium chloride flush  3 mL Intravenous Q12H    Continuous Infusions: . sodium chloride 75 mL/hr at 11/07/18 0923  . sodium chloride 75 mL/hr at 11/10/18 1019     LOS: 10 days     Kayleen Memos, MD [facility] Pager [phone]  If 7PM-7AM, please contact night-coverage [email or web address] Password [email or web address] 11/10/2018, 2:54 PM

## 2018-11-10 NOTE — Interval H&P Note (Signed)
History and Physical Interval Note:  11/10/2018 8:26 AM  Melissa Lopez  has presented today for surgery, with the diagnosis of nausea and vomiting  The various methods of treatment have been discussed with the patient and family. After consideration of risks, benefits and other options for treatment, the patient has consented to  Procedure(s): ESOPHAGOGASTRODUODENOSCOPY (EGD) WITH PROPOFOL (N/A) as a surgical intervention .  The patient's history has been reviewed, patient examined, no change in status, stable for surgery.  I have reviewed the patient's chart and labs.  Questions were answered to the patient's satisfaction.     [name]

## 2018-11-10 NOTE — Op Note (Signed)
[facility] Patient Name: Melissa Lopez Procedure Date : 11/10/2018 MRN: [id number] Attending MD: [name] , MD Date of Birth: 1951-03-14 CSN: [id number] Age: 67 Admit Type: Inpatient Procedure:                Upper GI endoscopy Indications:              Nausea with vomiting Providers:                [name], MD, Angus Seller, Charolette Child, Technician, Judeth Cornfield, CRNA Referring MD:             hospital team Medicines:                Propofol per Anesthesia, Monitored Anesthesia Care Complications:            No immediate complications. Estimated Blood Loss:     Estimated blood loss: none. Procedure:                Pre-Anesthesia Assessment:                           - Prior to the procedure, a History and Physical                            was performed, and patient medications and                            allergies were reviewed. The patient's tolerance of                            previous anesthesia was also reviewed. The risks                            and benefits of the procedure and the sedation                            options and risks were discussed with the patient.                            All questions were answered, and informed consent                            was obtained. Prior Anticoagulants: The patient has                            taken no previous anticoagulant or antiplatelet                            agents. ASA Grade Assessment: III - A patient with                            severe systemic disease. After reviewing the risks  and benefits, the patient was deemed in                            satisfactory condition to undergo the procedure.                           After obtaining informed consent, the endoscope was                            passed under direct vision. Throughout the                            procedure, the patient's blood  pressure, pulse, and                            oxygen saturations were monitored continuously. The                            [id number]-[id number] ([id number]) Olympus Adult EGD was introduced                            through the mouth, and advanced to the second part                            of duodenum. The upper GI endoscopy was                            accomplished without difficulty. The patient                            tolerated the procedure well. Scope In: Scope Out: Findings:      The examined esophagus was normal.      The Z-line was regular and was found 40 cm from the incisors.      Segmental mild inflammation characterized by congestion (edema),       erythema and linear erosions was found in the gastric antrum.      There is no endoscopic evidence of ulceration in the entire examined       stomach.      A small hiatal hernia was present.      Patchy mild mucosal changes characterized by congestion and erythema       were found in the duodenal bulb.      The exam of the duodenum was otherwise normal. Impression:               - Normal esophagus.                           - Z-line regular, 40 cm from the incisors.                           - Acute gastritis.                           - Small hiatal hernia.                           -  Mucosal changes in the duodenum.                           - No specimens collected. Recommendation:           - Advance diet as tolerated and clear liquid diet.                           - Observe patient's clinical course.                           - Use Protonix (pantoprazole) 40 mg PO BID. Procedure Code(s):        --- Professional ---                           [phone], Esophagogastroduodenoscopy, flexible,                            transoral; diagnostic, including collection of                            specimen(s) by brushing or washing, when performed                            (separate procedure) Diagnosis Code(s):        --- Professional  ---                           R11.2, Nausea with vomiting, unspecified                           K29.00, Acute gastritis without bleeding                           K44.9, Diaphragmatic hernia without obstruction or                            gangrene                           K31.89, Other diseases of stomach and duodenum CPT copyright 2018 American Medical Association. All rights reserved. The codes documented in this report are preliminary and upon coder review may  be revised to meet current compliance requirements. [name], MD 11/10/2018 8:50:18 AM This report has been signed electronically. Number of Addenda: 0

## 2018-11-10 NOTE — Anesthesia Preprocedure Evaluation (Signed)
Anesthesia Evaluation  Patient identified by MRN, date of birth, ID band Patient awake    Reviewed: Allergy & Precautions, NPO status , Patient's Chart, lab work & pertinent test results  Airway Mallampati: II  TM Distance: >3 FB Neck ROM: Full    Dental no notable dental hx.    Pulmonary neg pulmonary ROS,    Pulmonary exam normal breath sounds clear to auscultation       Cardiovascular hypertension, Pt. on medications + CAD  Normal cardiovascular exam Rhythm:Regular Rate:Normal     Neuro/Psych negative neurological ROS  negative psych ROS   GI/Hepatic negative GI ROS, Neg liver ROS,   Endo/Other  negative endocrine ROSdiabetes, Type 2  Renal/GU Renal InsufficiencyRenal disease  negative genitourinary   Musculoskeletal negative musculoskeletal ROS (+)   Abdominal (+) + obese,   Peds negative pediatric ROS (+)  Hematology negative hematology ROS (+)   Anesthesia Other Findings   Reproductive/Obstetrics negative OB ROS                             Anesthesia Physical Anesthesia Plan  ASA: III  Anesthesia Plan: MAC   Post-op Pain Management:    Induction: Intravenous  PONV Risk Score and Plan: 2 and Ondansetron and Midazolam  Airway Management Planned: Nasal Cannula  Additional Equipment:   Intra-op Plan:   Post-operative Plan:   Informed Consent: I have reviewed the patients History and Physical, chart, labs and discussed the procedure including the risks, benefits and alternatives for the proposed anesthesia with the patient or authorized representative who has indicated his/her understanding and acceptance.   Dental advisory given  Plan Discussed with: CRNA  Anesthesia Plan Comments:         Anesthesia Quick Evaluation

## 2018-11-10 NOTE — Brief Op Note (Signed)
Minimal duodenitis and mild distal gastritis. NO peptic ulcers or gastric outlet obstruction seen. Suspect gastroparesis and/or marijuana use as source of N/V. Reglan use is temporary due to potential irreversible side effects from long term use. Needs to establish with an endocrine doctor for her diabetes if she has not already and needs to stop smoking marijuana. Clear liquid diet and advance as tolerated. Do Abd CT scan with contrast as next step if unable to advance diet. F/U with Dr. Therisa Doyne in 3-6 weeks. No other GI recommendations. Will sign off. Call back if questions.

## 2018-11-11 ENCOUNTER — Encounter (HOSPITAL_COMMUNITY): Payer: Self-pay | Admitting: Gastroenterology

## 2018-11-11 LAB — GLUCOSE, CAPILLARY
Glucose-Capillary: 159 mg/dL — ABNORMAL HIGH (ref 70–99)
Glucose-Capillary: 129 mg/dL — ABNORMAL HIGH (ref 70–99)

## 2018-11-11 MED ORDER — METOCLOPRAMIDE HCL 5 MG PO TABS
5.0000 mg | ORAL_TABLET | Freq: Four times a day (QID) | ORAL | Status: DC
  Administered 2018-11-11 – 2018-11-12 (×4): 5 mg via ORAL
  Filled 2018-11-11 (×4): qty 1

## 2018-11-11 NOTE — Progress Notes (Signed)
PROGRESS NOTE    Melissa Lopez  GXQ:[id number] DOB: 04/16/1951 DOA: 10/31/2018 PCP: No primary care provider on file.     Brief Narrative:  Melissa Lopez an 67 y.o.femalewith history of type 2 diabetes mellitus with diabetic neuropathy, chronic kidney disease? Stage II, hypertension, CAD, arthritis requiring bilateral knee replacement presented as code STEMI after a near syncopal event at home. Patient admitted to cardiology service and underwent emergent cardiac cath and concern for EKG changes and abnormal troponin. Cardiac cath was unremarkable and did not explain her symptoms. Medicine service consulted for further evaluation.Patient has had dizzy spells, described as lightheaded feeling, for a long time and mostly on walking. She has been walker dependent at baseline due to bilateral arthritis and ataxia/periodic dizziness. The last episode was also associated with some chest discomfort. 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 She takes PPI at baseline.She has chronic exertional dyspnea with no new changes.Labs on presentation showed potassium of 2.2, creatinine of 2.7 (repeat level at 3.8), troponin of 0.33 and EKG showedwide-complex paced rhythm with ST changes.  Hospital course complicated by intermittent chest pain.  Work-up unremarkable.  Also intractable nausea and vomiting despite Reglan infusions.  GI consulted and followed.  EGD done revealed mild duodenitis and gastritis with no suspicion for peptic ulcers or gastric outlet obstruction.  Per GI high suspicion for gastroparesis and or marijuana use as source of nausea and vomiting.  Will need to establish with an endocrinologist for her diabetes and will need to stop smoking marijuana.   Per GI if further nausea and vomiting, recommends obtaining abdominal CT scan with contrast then follow-up with GI Dr.  Therisa Doyne in 3 to 6 weeks.  GI signed off on 11/10/2018.  New events last 24 hours / Subjective: According to daughter at bedside, patient had 4 bowel movements yesterday.  She has not had any further nausea or vomiting.  She tolerated clear liquid diet today, planning to advance to full liquids later today.  Denies any chest pain today  Assessment & Plan:   Principal Problem:   Chest pain with high risk of acute coronary syndrome Active Problems:   H/O cardiac pacemaker   Syncope   ST elevation - with non-obstructive CAD   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HCC)   Type 2 diabetes mellitus with complication, with long-term current use of insulin (HCC)   CKD (chronic kidney disease) stage 3, GFR 30-59 ml/min (HCC) - per patient report   Essential hypertension   AKI (acute kidney injury) ([place])   PE (pulmonary thromboembolism) (HCC)   Malnutrition of moderate degree   Nausea with vomiting   Chest pain:LHC on 10/31/2018 with mild diffuse disease. -Troponin flat peaked at 0.46 and trended down -Medical treatment. On carvedilol, aspirin, crestor  -No addition of ACE inhibitor due to renal insufficiency -Cardiology signed off on 11/08/2018, will follow-up outpatient.  Intractable nausea with high suspicion for gastroparesis  -EGD 11/10/2018 revealed mild duodenitis and gastritis with no evidence of gastric outlet obstruction or peptic ulcers disease. -If nausea persist obtain abdominal CT scan with contrast.  -GI signed off on 11/10/2018, will need to follow-up with Dr. Therisa Doyne in 3-6 weeks  -Will switch reglan to PO -Monitor and advance diet slowly today   Syncope/near syncope, falls:Orthostaticvitals, VQ scan, cardiac cathandcortisol level not impressive. Patient has a pacemaker placed in [place]. Concern for right BPPV per PT. Also concern for autonomic dysfunction in the  setting of diabetes althoughthis seems to be fairly controlled. -May need outpatient vestibular  rehab for BPPV -PT assessed and recommended home health PT -Fall precautions  Chronic diastolic DXI:[id number] on 10/31/2018 with EF of 50 to 60%, severe LVH, hypokinesis of apical myocardiumand G1DD.Currently without cardiopulmonary symptoms. Appears euvolemic. -Continue daily weight, intake output and renal function.  Essential HTN -Continue norvasc, hydralazin, carvedilol   Type 2 diabetes mellitus, IDDM:A1c 7.0% -Continue CBG monitoring -Continue NovoLog 70/30 12 units twice a day, SSI Novolog   Mild leukocytosis, LPF:[id number] resolved. Patient without urinary symptoms. Urine cultures negative. -Discontinue ceftriaxone11/23-11/27  AKI on CKD (chronic kidney disease) stage 3-4, improving: -Baseline creatinine of 1.8 and GFR of 28  -Presented with creatinine of 2.85, improving  -Avoid nephrotoxic agents/dehydration/hypotension -Monitor urine output -Continue to hold ACE inhibitor due to renal insufficiency  Hypercalcemia:  -PTH elevated and vit D low    DVT prophylaxis: Subq hep  Code Status: Full code Family Communication: Daughter at bedside Disposition Plan: Pending tolerance of full liquid diet today and advance; if does well without issues overnight, hopefully discharge tomorrow    Antimicrobials:  Anti-infectives (From admission, onward)   Start     Dose/Rate Route Frequency Ordered Stop   11/01/18 1100  cefTRIAXone (ROCEPHIN) 1 g in sodium chloride 0.9 % 100 mL IVPB  Status:  Discontinued     1 g 200 mL/hr over 30 Minutes Intravenous Daily 11/01/18 1025 11/04/18 1613       Objective: Vitals:   11/10/18 1457 11/10/18 2127 11/11/18 0849 11/11/18 1217  BP: (!) 180/97 133/78  136/80  Pulse: 60 (!) 59 63 60  Resp:  18  18  Temp: 98.3 F (36.8 C) 98.7 F (37.1 C)  98.8 F (37.1 C)  TempSrc: Oral   Oral  SpO2: 97% 98%  98%  Weight:  82.7 kg    Height:        Intake/Output Summary (Last 24 hours) at 11/11/2018 1737 Last data filed  at 11/11/2018 1200 Gross per 24 hour  Intake 3700.47 ml  Output 800 ml  Net 2900.47 ml   Filed Weights   11/08/18 0609 11/09/18 0553 11/10/18 2127  Weight: 84.9 kg 83.6 kg 82.7 kg    Examination:  General exam: Appears calm and comfortable  Respiratory system: Clear to auscultation. Respiratory effort normal. Cardiovascular system: S1 & S2 heard, RRR. No JVD, murmurs, rubs, gallops or clicks. No pedal edema. Gastrointestinal system: Abdomen is nondistended, soft and nontender. No organomegaly or masses felt. Normal bowel sounds heard. Central nervous system: Alert and oriented. No focal neurological deficits. Extremities: Symmetric 5 x 5 power. Skin: No rashes, lesions or ulcers Psychiatry: Judgement and insight appear normal. Mood & affect appropriate.   Data Reviewed: I have personally reviewed following labs and imaging studies  CBC: Recent Labs  Lab 11/06/18 0413 11/10/18 0627  WBC 10.1 9.7  HGB 15.0 14.9  HCT 45.7 45.4  MCV 87.9 88.0  PLT 218 992   Basic Metabolic Panel: Recent Labs  Lab 11/05/18 0343 11/06/18 0727 11/07/18 0309 11/08/18 0353 11/10/18 0627  NA 135 134* 133* 136 134*  K 3.9 3.9 4.2 4.2 3.6  CL 106 104 101 105 102  CO2 23 22 21* 24 22  GLUCOSE 106* 153* 130* 111* 142*  BUN 16 18 23 22 17   CREATININE 1.85* 1.96* 2.10* 1.98* 1.85*  CALCIUM 11.8* 12.1* 11.9* 11.5* 11.8*   GFR: Estimated Creatinine Clearance: 30 mL/min (A) (by C-G formula based on SCr of 1.85  mg/dL (H)). Liver Function Tests: Recent Labs  Lab 11/05/18 0343  AST 27  ALT 51*  ALKPHOS 116  BILITOT 0.5  PROT 6.6  ALBUMIN 3.5   No results for input(s): LIPASE, AMYLASE in the last 168 hours. No results for input(s): AMMONIA in the last 168 hours. Coagulation Profile: No results for input(s): INR, PROTIME in the last 168 hours. Cardiac Enzymes: Recent Labs  Lab 11/05/18 1031 11/05/18 1525 11/05/18 2215  TROPONINI 0.06* 0.05* 0.06*   BNP (last 3 results) No results  for input(s): PROBNP in the last 8760 hours. HbA1C: No results for input(s): HGBA1C in the last 72 hours. CBG: Recent Labs  Lab 11/10/18 1111 11/10/18 1630 11/10/18 2137 11/11/18 0802 11/11/18 1200  GLUCAP 160* 122* 113* 159* 129*   Lipid Profile: No results for input(s): CHOL, HDL, LDLCALC, TRIG, CHOLHDL, LDLDIRECT in the last 72 hours. Thyroid Function Tests: No results for input(s): TSH, T4TOTAL, FREET4, T3FREE, THYROIDAB in the last 72 hours. Anemia Panel: No results for input(s): VITAMINB12, FOLATE, FERRITIN, TIBC, IRON, RETICCTPCT in the last 72 hours. Sepsis Labs: No results for input(s): PROCALCITON, LATICACIDVEN in the last 168 hours.  Recent Results (from the past 240 hour(s))  Urine Culture     Status: None   Collection Time: 11/02/18  3:21 PM  Result Value Ref Range Status   Specimen Description URINE, RANDOM  Final   Special Requests NONE  Final   Culture   Final    NO GROWTH Performed at [facility], [address], [place], [place] [address]    Report Status 11/03/2018 FINAL  Final       Radiology Studies: No results found.    Scheduled Meds: . amLODipine  10 mg Oral Daily  . aspirin  81 mg Oral Daily  . carvedilol  12.5 mg Oral BID WC  . feeding supplement  1 Container Oral TID BM  . heparin injection (subcutaneous)  5,000 Units Subcutaneous Q8H  . hydrALAZINE  50 mg Oral Q8H  . insulin aspart  0-20 Units Subcutaneous TID WC  . insulin aspart  0-5 Units Subcutaneous QHS  . insulin aspart protamine- aspart  12 Units Subcutaneous BID WC  . metoCLOPramide  5 mg Oral Q6H  . pantoprazole  40 mg Oral BID  . pneumococcal 23 valent vaccine  0.5 mL Intramuscular Tomorrow-1000  . polyethylene glycol  17 g Oral TID  . potassium chloride  40 mEq Oral Daily  . rosuvastatin  5 mg Oral q1800  . senna-docusate  2 tablet Oral BID  . sodium chloride flush  3 mL Intravenous Q12H   Continuous Infusions: . sodium chloride 75 mL/hr at 11/07/18 0923      LOS: 11 days    Time spent: 45 minutes   Dessa Phi, DO [facility] [email or web address] Password [facility] 11/11/2018, 5:37 PM

## 2018-11-11 NOTE — Progress Notes (Signed)
Physical Therapy Treatment Patient Details Name: Melissa Lopez MRN: [id number] DOB: 04/23/1951 Today's Date: 11/11/2018    History of Present Illness Pt is a 67 y.o. F with significant PMH of type 2 diabetes mellitus with diabetic neuropathy, chronic kidney disease, hypertension, CAD, bilateral knee replacement who presents as code STEMI after syncopal episode at home.     PT Comments    Pt admitted with above diagnosis. Pt currently with functional limitations due to the deficits listed below (see PT Problem List). Pt was able to ambulate with supervision with challenges to balance scoring 21/24 on DGI.  Has progressed well and daughter and pt feel confident with mobility.  SEnt exercise program to pts email for home.   Pt will benefit from skilled PT to increase their independence and safety with mobility to allow discharge to the venue listed below.     Follow Up Recommendations  Home health PT;Supervision/Assistance - 24 hour     Equipment Recommendations  None recommended by PT    Recommendations for Other Services       Precautions / Restrictions Precautions Precautions: Fall Precaution Comments: history of recurrent falls Restrictions Weight Bearing Restrictions: No    Mobility  Bed Mobility Overal bed mobility: Modified Independent                Transfers Overall transfer level: Needs assistance Equipment used: Rolling walker (2 wheeled) Transfers: Sit to/from Stand Sit to Stand: Supervision            Ambulation/Gait Ambulation/Gait assistance: Supervision Gait Distance (Feet): 250 Feet Assistive device: Rolling walker (2 wheeled) Gait Pattern/deviations: Step-through pattern;Decreased stride length Gait velocity: decreased Gait velocity interpretation: <1.31 ft/sec, indicative of household ambulator General Gait Details: No issues with balance today.  Good safety with RW.  Pt able to withstand min challenges to balance.    Stairs              Wheelchair Mobility    Modified Rankin (Stroke Patients Only)       Balance Overall balance assessment: Needs assistance Sitting-balance support: Feet supported Sitting balance-Leahy Scale: Good     Standing balance support: Bilateral upper extremity supported Standing balance-Leahy Scale: Fair Standing balance comment: can stand without UE support                  Standardized Balance Assessment Standardized Balance Assessment : Dynamic Gait Index   Dynamic Gait Index Level Surface: Normal Change in Gait Speed: Normal Gait with Horizontal Head Turns: Normal Gait with Vertical Head Turns: Normal Gait and Pivot Turn: Mild Impairment Step Over Obstacle: Mild Impairment Step Around Obstacles: Normal Steps: Mild Impairment Total Score: 21      Cognition Arousal/Alertness: Awake/alert Behavior During Therapy: Flat affect Overall Cognitive Status: Within Functional Limits for tasks assessed                                        Exercises General Exercises - Lower Extremity Ankle Circles/Pumps: AROM;Both;10 reps;Supine Long Arc Quad: AROM;Both;10 reps;Seated Hip Flexion/Marching: AROM;Both;10 reps;Seated Toe Raises: AROM;Both;10 reps;Standing Mini-Sqauts: AROM;Both;10 reps;Standing    General Comments General comments (skin integrity, edema, etc.): Access Code: [email or web address] - sent to [email or web address]      Pertinent Vitals/Pain Faces Pain Scale: No hurt    Home Living  Prior Function            PT Goals (current goals can now be found in the care plan section) Progress towards PT goals: Progressing toward goals    Frequency    Min 3X/week      PT Plan Current plan remains appropriate    Co-evaluation              AM-PAC PT "6 Clicks" Mobility   Outcome Measure  Help needed turning from your back to your side while in a flat bed without using bedrails?: None Help needed moving  from lying on your back to sitting on the side of a flat bed without using bedrails?: None Help needed moving to and from a bed to a chair (including a wheelchair)?: None Help needed standing up from a chair using your arms (e.g., wheelchair or bedside chair)?: None Help needed to walk in hospital room?: A Little Help needed climbing 3-5 steps with a railing? : A Lot 6 Click Score: 21    End of Session Equipment Utilized During Treatment: Gait belt Activity Tolerance: Patient tolerated treatment well Patient left: with call bell/phone within reach;with family/visitor present;in bed Nurse Communication: Mobility status PT Visit Diagnosis: Unsteadiness on feet (R26.81);Difficulty in walking, not elsewhere classified (R26.2);Repeated falls (R29.6)     Time: 3736-6815 PT Time Calculation (min) (ACUTE ONLY): 20 min  Charges:  $Gait Training: 8-22 mins                     Teresita Pager:  [phone]  Office:  [place] 11/11/2018, 11:34 AM

## 2018-11-12 LAB — BASIC METABOLIC PANEL
Anion gap: 10 (ref 5–15)
BUN: 17 mg/dL (ref 8–23)
CO2: 21 mmol/L — ABNORMAL LOW (ref 22–32)
Calcium: 12 mg/dL — ABNORMAL HIGH (ref 8.9–10.3)
Chloride: 103 mmol/L (ref 98–111)
Creatinine, Ser: 1.76 mg/dL — ABNORMAL HIGH (ref 0.44–1.00)
GFR calc non Af Amer: 29 mL/min — ABNORMAL LOW (ref >60)
GFR, EST AFRICAN AMERICAN: 34 mL/min — AB (ref >60)
Glucose, Bld: 124 mg/dL — ABNORMAL HIGH (ref 70–99)
Potassium: 3.8 mmol/L (ref 3.5–5.1)
Sodium: 134 mmol/L — ABNORMAL LOW (ref 135–145)

## 2018-11-12 LAB — GLUCOSE, CAPILLARY
GLUCOSE-CAPILLARY: 144 mg/dL — AB (ref 70–99)
GLUCOSE-CAPILLARY: 107 mg/dL — AB (ref 70–99)

## 2018-11-12 LAB — PTH-RELATED PEPTIDE: PTH-related peptide: <2 pmol/L

## 2018-11-12 MED ORDER — INSULIN ASPART 100 UNIT/ML ~~LOC~~ SOLN: 0.0000 [IU] | Freq: Three times a day (TID) | SUBCUTANEOUS | 0 refills | Status: DC

## 2018-11-12 MED ORDER — HYDRALAZINE HCL 50 MG PO TABS: 50.0000 mg | ORAL_TABLET | Freq: Three times a day (TID) | ORAL | 0 refills | Status: DC

## 2018-11-12 MED ORDER — AMLODIPINE BESYLATE 10 MG PO TABS: 10.0000 mg | ORAL_TABLET | Freq: Every day | ORAL | 0 refills | Status: DC

## 2018-11-12 MED ORDER — ROSUVASTATIN CALCIUM 5 MG PO TABS: 5.0000 mg | ORAL_TABLET | Freq: Every day | ORAL | 0 refills | Status: DC

## 2018-11-12 MED ORDER — INSULIN ASPART 100 UNIT/ML ~~LOC~~ SOLN: 0.0000 [IU] | Freq: Every day | SUBCUTANEOUS | 0 refills | Status: DC

## 2018-11-12 MED ORDER — INSULIN ASPART PROT & ASPART (70-30 MIX) 100 UNIT/ML ~~LOC~~ SUSP: 12.0000 [IU] | Freq: Two times a day (BID) | SUBCUTANEOUS | 0 refills | Status: DC

## 2018-11-12 MED ORDER — ASPIRIN 81 MG PO CHEW: 81.0000 mg | CHEWABLE_TABLET | Freq: Every day | ORAL | 0 refills | Status: DC

## 2018-11-12 MED ORDER — METOCLOPRAMIDE HCL 5 MG PO TABS: 5.0000 mg | ORAL_TABLET | Freq: Four times a day (QID) | ORAL | 0 refills | Status: DC

## 2018-11-12 MED ORDER — CARVEDILOL 12.5 MG PO TABS: 12.5000 mg | ORAL_TABLET | Freq: Two times a day (BID) | ORAL | 0 refills | Status: DC

## 2018-11-12 NOTE — Care Management Important Message (Signed)
Important Message  Patient Details  Name: Melissa Lopez MRN: [id number] Date of Birth: 1951-01-05   Medicare Important Message Given:  Yes    Lionel Woodberry P Saunders [place] 11/12/2018, 12:24 PM

## 2018-11-12 NOTE — Discharge Summary (Signed)
Physician Discharge Summary  [name] LNL:[id number] DOB: May 30, 1951 DOA: 10/31/2018  PCP: No primary care provider on file.  Admit date: 10/31/2018 Discharge date: 11/12/2018  Admitted From: Home Disposition:  Home  Recommendations for Outpatient Follow-up:  1. Follow up with PCP in 1 week (has PCP in [place], recommended follow up asap or establish with PCP in town if she will stay in [place])  2. Follow up with Cardiology 3. Follow up with Dr. Joelyn Oms  4. Follow up with GI Dr. Therisa Doyne in 4 weeks  5. Please obtain BMP in 1 week, will need outpatient work up for hypercalcemia   Home Health: PT RN  Equipment/Devices: None   Discharge Condition: Stable CODE STATUS: Full  Diet recommendation: Heart healthy, carb modified   Brief/Interim Summary: Melissa Lopez an 67 y.o.femalewith history of type 2 diabetes mellitus with diabetic neuropathy, chronic kidney disease? Stage II, hypertension, CAD, arthritis requiring bilateral knee replacement presented as code STEMI after a near syncopal event at home. Patient admitted to cardiology service and underwent emergent cardiac cath and concern for EKG changes and abnormal troponin. Cardiac cath was unremarkable and did not explain her symptoms. Medicine service consulted for further evaluation.Patient has had dizzy spells, described as lightheaded feeling, for a long time and mostly on walking. She has been walker dependent at baseline due to bilateral arthritis and ataxia/periodic dizziness. The last episode was also associated with some chest discomfort. 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 She takes PPI at baseline.She has chronic exertional dyspnea with no new changes.Labs on presentation showed potassium of 2.2, creatinine of 2.7 (repeat level at 3.8), troponin of 0.33 and EKG showedwide-complex paced  rhythm with ST changes.  Hospital course complicated by intermittent chest pain. Work-up unremarkable. Also intractable nausea and vomiting despite Reglan infusions. GI consulted and followed. EGD done revealed mild duodenitis and gastritis with no suspicion for peptic ulcers or gastric outlet obstruction. Per GI high suspicion for gastroparesis and or marijuana use as source of nausea and vomiting. Will need to establish with an endocrinologistfor her diabetes and will need to stop smoking marijuana.   Per GI if further nausea and vomiting,recommends obtaining abdominal CT scan with contrast then follow-up with GI Dr. Therisa Doyne in 3 to 6weeks. GI signed off on 11/10/2018.    Her nausea and vomiting resolved after having multiple bowel movements.  On day of discharge, she was tolerating soft diet without any issues.  She felt ready to be discharged home.  All questions were answered with daughter at bedside.  Discharge Diagnoses:  Principal Problem:   Chest pain with high risk of acute coronary syndrome Active Problems:   H/O cardiac pacemaker   Syncope   ST elevation - with non-obstructive CAD   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HCC)   Type 2 diabetes mellitus with complication, with long-term current use of insulin (HCC)   CKD (chronic kidney disease) stage 3, GFR 30-59 ml/min (HCC) - per patient report   Essential hypertension   AKI (acute kidney injury) ([facility])   PE (pulmonary thromboembolism) (HCC)   Malnutrition of moderate degree   Nausea with vomiting  Chest pain:LHC on 10/31/2018 with mild diffuse disease. -Troponin flat peaked at 0.46 and trended down -Medical treatment. On carvedilol, aspirin, crestor  -No addition of ACE inhibitor due to renal insufficiency -Cardiology signed offon 11/08/2018, will follow-up outpatient.  Intractable nausea with high suspicion for gastroparesis  -EGD 11/10/2018  revealed mild duodenitis and gastritis with no evidence of  gastric outlet obstruction or peptic ulcers disease. -If nausea persist obtain abdominal CT scan with contrast.  -GI signed off on 11/10/2018, willneed to follow-up with Dr. Therisa Doyne in 3-6 weeks  -Will switch reglan to PO -No further nausea or vomiting, tolerating soft diet  Syncope/near syncope, falls:Orthostaticvitals, VQ scan, cardiac cathandcortisol level not impressive. Patient has a pacemaker placed in [place]. Concern for right BPPV per PT. Also concern for autonomic dysfunction in the setting of diabetes althoughthis seems to be fairly controlled. -May need outpatient vestibular rehab for BPPV -PT assessed and recommended home health PT -Fall precautions  Chronic diastolic DXI:[id number] on 10/31/2018 with EF of 50 to 60%, severe LVH, hypokinesis of apical myocardiumand G1DD.Currently without cardiopulmonary symptoms. Appears euvolemic. -Continue daily weight, intake output and renal function.  Essential HTN -Continue norvasc, hydralazin, carvedilol   Type 2 diabetes mellitus, IDDM:A1c 7.0% -Continue CBG monitoring -Continue NovoLog 70/30 12 units twice a day, SSI Novolog   Mild leukocytosis, LPF:XTKWIOXBDZHG resolved. Patient without urinary symptoms. Urine cultures negative. -Discontinue ceftriaxone11/23-11/27  AKI on CKD (chronic kidney disease) stage 3-4, improving: -Baseline creatinine of 1.8 and GFR of 28  -Presented with creatinine of 2.85, improving  -Avoid nephrotoxic agents/dehydration/hypotension -Monitor urine output -Continue to hold ACE inhibitor due to renal insufficiency  Hypercalcemia:  -PTH elevated and vit D low   Discharge Instructions  Discharge Instructions    Call MD for:  difficulty breathing, headache or visual disturbances   Complete by:  As directed    Call MD for:  extreme fatigue   Complete by:  As directed    Call MD for:  persistant dizziness or light-headedness   Complete by:  As directed    Call MD  for:  persistant nausea and vomiting   Complete by:  As directed    Call MD for:  severe uncontrolled pain   Complete by:  As directed    Call MD for:  temperature >100.4   Complete by:  As directed    Diet - low sodium heart healthy   Complete by:  As directed    Diet Carb Modified   Complete by:  As directed    Discharge instructions   Complete by:  As directed    You were cared for by a hospitalist during your hospital stay. If you have any questions about your discharge medications or the care you received while you were in the hospital after you are discharged, you can call the unit and ask to speak with the hospitalist on call if the hospitalist that took care of you is not available. Once you are discharged, your primary care physician will handle any further medical issues. Please note that NO REFILLS for any discharge medications will be authorized once you are discharged, as it is imperative that you return to your primary care physician (or establish a relationship with a primary care physician if you do not have one) for your aftercare needs so that they can reassess your need for medications and monitor your lab values.   Increase activity slowly   Complete by:  As directed      Allergies as of 11/12/2018      Reactions   Percocet [oxycodone-acetaminophen]       Medication List    STOP taking these medications   lisinopril 40 MG tablet Commonly known as:  PRINIVIL,ZESTRIL     TAKE these medications   amLODipine 10 MG tablet Commonly  known as:  NORVASC Take 1 tablet (10 mg total) by mouth daily. Start taking on:  11/13/2018 What changed:    medication strength  how much to take  when to take this   aspirin 81 MG chewable tablet Chew 1 tablet (81 mg total) by mouth daily. Start taking on:  11/13/2018   carvedilol 12.5 MG tablet Commonly known as:  COREG Take 1 tablet (12.5 mg total) by mouth 2 (two) times daily with a meal. What changed:    medication  strength  how much to take   gabapentin 300 MG capsule Commonly known as:  NEURONTIN Take 300 mg by mouth 3 (three) times daily.   hydrALAZINE 50 MG tablet Commonly known as:  APRESOLINE Take 1 tablet (50 mg total) by mouth every 8 (eight) hours. What changed:    medication strength  how much to take  when to take this   insulin aspart 100 UNIT/ML injection Commonly known as:  novoLOG Inject 0-20 Units into the skin 3 (three) times daily with meals.   insulin aspart 100 UNIT/ML injection Commonly known as:  novoLOG Inject 0-5 Units into the skin at bedtime.   insulin aspart protamine- aspart (70-30) 100 UNIT/ML injection Commonly known as:  NOVOLOG MIX 70/30 Inject 0.12 mLs (12 Units total) into the skin 2 (two) times daily with a meal.   metoCLOPramide 5 MG tablet Commonly known as:  REGLAN Take 1 tablet (5 mg total) by mouth every 6 (six) hours.   omeprazole 20 MG capsule Commonly known as:  PRILOSEC Take 20 mg by mouth daily.   rosuvastatin 5 MG tablet Commonly known as:  CRESTOR Take 1 tablet (5 mg total) by mouth daily at 6 PM.   simethicone 125 MG chewable tablet Commonly known as:  MYLICON Chew 798-921 mg by mouth every 6 (six) hours as needed for flatulence.   traZODone 100 MG tablet Commonly known as:  DESYREL Take 100 mg by mouth at bedtime.      Follow-up Information    [facility] Follow [facility].   Specialty:  Home Health Services Why:  Registered Nurse, Physical Therapy Contact information: [address] [place] [address] [phone]        Ronnette Juniper, MD. Schedule an appointment as soon as possible for a visit in 4 week(s).   Specialty:  Gastroenterology Why:  nausea, vomiting, constipation Contact information: [address] [place] [address] [phone]        Leonie Man, MD. Call.   Specialty:  Cardiology Contact information: [address] [place]  [address] [phone]        Rexene Agent, MD. Call.   Specialty:  Nephrology Contact information: [place] [address] [phone]        Your primary care physician. Schedule an appointment as soon as possible for a visit.   Why:  If you will not return to [place], establish with primary care physician in [facility] ASAP          Allergies  Allergen Reactions  . Percocet [Oxycodone-Acetaminophen]       Procedures/Studies: Dg Chest 2 View  Result Date: 11/01/2018 CLINICAL DATA:  Elevated D-dimer. EXAM: CHEST - 2 VIEW COMPARISON:  None. FINDINGS: UPPER limits normal heart size and LEFT-sided pacemaker noted. There is no evidence of focal airspace disease, pulmonary edema, suspicious pulmonary nodule/mass, pleural effusion, or pneumothorax. No acute bony abnormalities are identified. IMPRESSION: UPPER limits normal  heart size without evidence of acute cardiopulmonary disease. Electronically Signed   By: Margarette Canada M.D.   On: 11/01/2018 13:23   US Renal  Result Date: 11/01/2018 CLINICAL DATA:  Acute renal failure. EXAM: RENAL / URINARY TRACT ULTRASOUND COMPLETE COMPARISON:  None. FINDINGS: Right Kidney: Renal measurements: 11.6 x 7.6 x 7.1 cm = volume: 326 mL. Multiple cysts throughout the right kidney. No hydronephrosis. Left Kidney: Renal measurements: 13.3 x 6.5 x 5 cm = volume: 225 mL. Multiple cysts in the left kidney. No hydronephrosis. Bladder: No wall thickening or filling defects identified. IMPRESSION: Multiple bilateral renal cysts. 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 No hydronephrosis. Electronically Signed   By: Lucienne Capers M.D.   On: 11/01/2018 02:04   Nm Pulmonary Perf And Vent  Result Date: 11/01/2018 CLINICAL DATA:  67 year old female with elevated D-dimer and syncope. EXAM: NUCLEAR MEDICINE VENTILATION - PERFUSION LUNG SCAN TECHNIQUE: Ventilation images were obtained in multiple projections using inhaled aerosol Tc-43m DTPA.  Perfusion images were obtained in multiple projections after intravenous injection of Tc-33m MAA. RADIOPHARMACEUTICALS:  31.8 mCi of Tc-30m DTPA aerosol inhalation and 4.4 mCi Tc80m MAA IV COMPARISON:  11/01/2018 chest radiograph FINDINGS: Ventilation: No focal ventilation defect. Perfusion: No wedge shaped peripheral perfusion defects to suggest acute pulmonary embolism. IMPRESSION: Normal VQ scan. Electronically Signed   By: Margarette Canada M.D.   On: 11/01/2018 13:22    Cardiac cath 11/23 SUMMARY:  Angiographically mild diffuse disease, only potentially occluded vessels are very very small branch of the major obtuse marginal.  Not likely a culprit lesion for significant anginal chest pain.  Mild to moderately elevated LVEDP.  LV gram not performed due to renal insufficiency.  RECOMMENDATIONS:  Based on presentation, she will be admitted to the ICU but would have her stepdown status.  Since were not clear of the etiology of her chest pain, will restart IV heparin 8 hours after sheath removal until we are able to exclude PE.  Also for the possible small branch occlusion.  Check 2D echocardiogram to exclude pericardial effusion with possible pericarditis as etiology.  Also need to evaluate her cause for syncope.  She did not have chest pain with syncope and denies agree with this chest pain so they probably are not related.  ? Check 2D echocardiogram ? Interrogate pacemaker ? Monitor for arrhythmias on telemetry  Recommend Aspirin 81mg  daily for moderate CAD.  With renal insufficiency, will need to consider having internal medicine/nephrology follow especially with her diabetes. -Quite likely multiple noncardiac issues.    Echo 11/23 Study Conclusions - Left ventricle: The cavity size was normal. Wall thickness was   increased in a pattern of severe LVH. Systolic function was   normal. The estimated ejection fraction was in the range of 55%   to 60%. There is hypokinesis of the apical  myocardium. Doppler   parameters are consistent with abnormal left ventricular   relaxation (grade 1 diastolic dysfunction). - Pericardium, extracardiac: A trivial pericardial effusion was   identified.  Impressions: - Definity used; apical hypokinesis with overall normal LV systolic   function; mild diastolic dysfunction; severe LVH more prominent   at apex; consider apical variant hypertrophic cardiomyopathy or   infiltrative cardiomyopathy.    EGD 12/3 - Normal esophagus. - Z-line regular, 40 cm from the incisors. - Acute gastritis. - Small hiatal hernia. - Mucosal changes in the duodenum. - No specimens collected.   Discharge Exam: Vitals:   11/11/18 2127 11/12/18 0617  BP: 116/71 (!) 165/90  Pulse: Marland Kitchen)  59 65  Resp:    Temp: 98.6 F (37 C) 98.4 F (36.9 C)  SpO2: 97% 99%    General: Pt is alert, awake, not in acute distress Cardiovascular: RRR, S1/S2 +, no rubs, no gallops Respiratory: CTA bilaterally, no wheezing, no rhonchi Abdominal: Soft, NT, ND, bowel sounds + Extremities: no edema, no cyanosis    The results of significant diagnostics from this hospitalization (including imaging, microbiology, ancillary and laboratory) are listed below for reference.     Microbiology: Recent Results (from the past 240 hour(s))  Urine Culture     Status: None   Collection Time: 11/02/18  3:21 PM  Result Value Ref Range Status   Specimen Description URINE, RANDOM  Final   Special Requests NONE  Final   Culture   Final    NO GROWTH Performed at [facility], [address], [place], [place] [address]    Report Status 11/03/2018 FINAL  Final     Labs: BNP (last 3 results) No results for input(s): BNP in the last 8760 hours. Basic Metabolic Panel: Recent Labs  Lab 11/06/18 0727 11/07/18 0309 11/08/18 0353 11/10/18 0627 11/12/18 0342  NA 134* 133* 136 134* 134*  K 3.9 4.2 4.2 3.6 3.8  CL 104 101 105 102 103  CO2 22 21* 24 22 21*  GLUCOSE 153* 130*  111* 142* 124*  BUN 18 23 22 17 17   CREATININE 1.96* 2.10* 1.98* 1.85* 1.76*  CALCIUM 12.1* 11.9* 11.5* 11.8* 12.0*   Liver Function Tests: No results for input(s): AST, ALT, ALKPHOS, BILITOT, PROT, ALBUMIN in the last 168 hours. No results for input(s): LIPASE, AMYLASE in the last 168 hours. No results for input(s): AMMONIA in the last 168 hours. CBC: Recent Labs  Lab 11/06/18 0413 11/10/18 0627  WBC 10.1 9.7  HGB 15.0 14.9  HCT 45.7 45.4  MCV 87.9 88.0  PLT 218 241   Cardiac Enzymes: Recent Labs  Lab 11/05/18 1525 11/05/18 2215  TROPONINI 0.05* 0.06*   BNP: Invalid input(s): POCBNP CBG: Recent Labs  Lab 11/10/18 2137 11/11/18 0802 11/11/18 1200 11/12/18 0733 11/12/18 1123  GLUCAP 113* 159* 129* 144* 107*   D-Dimer No results for input(s): DDIMER in the last 72 hours. Hgb A1c No results for input(s): HGBA1C in the last 72 hours. Lipid Profile No results for input(s): CHOL, HDL, LDLCALC, TRIG, CHOLHDL, LDLDIRECT in the last 72 hours. Thyroid function studies No results for input(s): TSH, T4TOTAL, T3FREE, THYROIDAB in the last 72 hours.  Invalid input(s): FREET3 Anemia work up No results for input(s): VITAMINB12, FOLATE, FERRITIN, TIBC, IRON, RETICCTPCT in the last 72 hours. Urinalysis    Component Value Date/Time   COLORURINE YELLOW 10/31/2018 1751   APPEARANCEUR CLOUDY (A) 10/31/2018 1751   LABSPEC 1.026 10/31/2018 1751   PHURINE 5.0 10/31/2018 1751   GLUCOSEU NEGATIVE 10/31/2018 1751   HGBUR SMALL (A) 10/31/2018 1751   BILIRUBINUR NEGATIVE 10/31/2018 1751   KETONESUR NEGATIVE 10/31/2018 1751   PROTEINUR 30 (A) 10/31/2018 1751   NITRITE NEGATIVE 10/31/2018 1751   LEUKOCYTESUR LARGE (A) 10/31/2018 1751   Sepsis Labs Invalid input(s): PROCALCITONIN,  WBC,  LACTICIDVEN Microbiology Recent Results (from the past 240 hour(s))  Urine Culture     Status: None   Collection Time: 11/02/18  3:21 PM  Result Value Ref Range Status   Specimen Description  URINE, RANDOM  Final   Special Requests NONE  Final   Culture   Final    NO GROWTH Performed at [facility]  [facility], [place] [address] [place], [place] [address]    Report Status 11/03/2018 FINAL  Final     Patient was seen and examined on the day of discharge and was found to be in stable condition. Time coordinating discharge: 40 minutes including assessment and coordination of care, as well as examination of the patient.   SIGNED:  Dessa Phi, DO [facility] Pager [phone]  If 7PM-7AM, please contact night-coverage [email or web address] Password [email or web address] 11/12/2018, 2:52 PM

## 2018-11-13 DIAGNOSIS — K2900 Acute gastritis without bleeding: Secondary | ICD-10-CM | POA: Diagnosis not present

## 2018-11-13 DIAGNOSIS — K3184 Gastroparesis: Secondary | ICD-10-CM | POA: Diagnosis not present

## 2018-11-13 DIAGNOSIS — I5032 Chronic diastolic (congestive) heart failure: Secondary | ICD-10-CM | POA: Diagnosis not present

## 2018-11-13 DIAGNOSIS — Z9181 History of falling: Secondary | ICD-10-CM | POA: Diagnosis not present

## 2018-11-13 DIAGNOSIS — I25118 Atherosclerotic heart disease of native coronary artery with other forms of angina pectoris: Secondary | ICD-10-CM | POA: Diagnosis not present

## 2018-11-13 DIAGNOSIS — I130 Hypertensive heart and chronic kidney disease with heart failure and stage 1 through stage 4 chronic kidney disease, or unspecified chronic kidney disease: Secondary | ICD-10-CM | POA: Diagnosis not present

## 2018-11-13 DIAGNOSIS — Z7982 Long term (current) use of aspirin: Secondary | ICD-10-CM | POA: Diagnosis not present

## 2018-11-13 DIAGNOSIS — Z950 Presence of cardiac pacemaker: Secondary | ICD-10-CM | POA: Diagnosis not present

## 2018-11-13 DIAGNOSIS — Z794 Long term (current) use of insulin: Secondary | ICD-10-CM | POA: Diagnosis not present

## 2018-11-13 DIAGNOSIS — E1142 Type 2 diabetes mellitus with diabetic polyneuropathy: Secondary | ICD-10-CM | POA: Diagnosis not present

## 2018-11-13 DIAGNOSIS — Z96653 Presence of artificial knee joint, bilateral: Secondary | ICD-10-CM | POA: Diagnosis not present

## 2018-11-13 DIAGNOSIS — E1122 Type 2 diabetes mellitus with diabetic chronic kidney disease: Secondary | ICD-10-CM | POA: Diagnosis not present

## 2018-11-13 DIAGNOSIS — N183 Chronic kidney disease, stage 3 (moderate): Secondary | ICD-10-CM | POA: Diagnosis not present

## 2018-11-13 DIAGNOSIS — E1143 Type 2 diabetes mellitus with diabetic autonomic (poly)neuropathy: Secondary | ICD-10-CM | POA: Diagnosis not present

## 2018-11-13 LAB — GLUCOSE, CAPILLARY
Glucose-Capillary: 103 mg/dL — ABNORMAL HIGH (ref 70–99)
Glucose-Capillary: 108 mg/dL — ABNORMAL HIGH (ref 70–99)

## 2018-11-17 DIAGNOSIS — E1143 Type 2 diabetes mellitus with diabetic autonomic (poly)neuropathy: Secondary | ICD-10-CM | POA: Diagnosis not present

## 2018-11-17 DIAGNOSIS — E1122 Type 2 diabetes mellitus with diabetic chronic kidney disease: Secondary | ICD-10-CM | POA: Diagnosis not present

## 2018-11-17 DIAGNOSIS — I25118 Atherosclerotic heart disease of native coronary artery with other forms of angina pectoris: Secondary | ICD-10-CM | POA: Diagnosis not present

## 2018-11-17 DIAGNOSIS — I130 Hypertensive heart and chronic kidney disease with heart failure and stage 1 through stage 4 chronic kidney disease, or unspecified chronic kidney disease: Secondary | ICD-10-CM | POA: Diagnosis not present

## 2018-11-17 DIAGNOSIS — N183 Chronic kidney disease, stage 3 (moderate): Secondary | ICD-10-CM | POA: Diagnosis not present

## 2018-11-17 DIAGNOSIS — I5032 Chronic diastolic (congestive) heart failure: Secondary | ICD-10-CM | POA: Diagnosis not present

## 2018-11-19 DIAGNOSIS — E1122 Type 2 diabetes mellitus with diabetic chronic kidney disease: Secondary | ICD-10-CM | POA: Diagnosis not present

## 2018-11-19 DIAGNOSIS — I25118 Atherosclerotic heart disease of native coronary artery with other forms of angina pectoris: Secondary | ICD-10-CM | POA: Diagnosis not present

## 2018-11-19 DIAGNOSIS — I130 Hypertensive heart and chronic kidney disease with heart failure and stage 1 through stage 4 chronic kidney disease, or unspecified chronic kidney disease: Secondary | ICD-10-CM | POA: Diagnosis not present

## 2018-11-19 DIAGNOSIS — E1143 Type 2 diabetes mellitus with diabetic autonomic (poly)neuropathy: Secondary | ICD-10-CM | POA: Diagnosis not present

## 2018-11-19 DIAGNOSIS — N183 Chronic kidney disease, stage 3 (moderate): Secondary | ICD-10-CM | POA: Diagnosis not present

## 2018-11-19 DIAGNOSIS — I5032 Chronic diastolic (congestive) heart failure: Secondary | ICD-10-CM | POA: Diagnosis not present

## 2018-11-23 DIAGNOSIS — N183 Chronic kidney disease, stage 3 (moderate): Secondary | ICD-10-CM | POA: Diagnosis not present

## 2018-11-23 DIAGNOSIS — E1143 Type 2 diabetes mellitus with diabetic autonomic (poly)neuropathy: Secondary | ICD-10-CM | POA: Diagnosis not present

## 2018-11-23 DIAGNOSIS — I130 Hypertensive heart and chronic kidney disease with heart failure and stage 1 through stage 4 chronic kidney disease, or unspecified chronic kidney disease: Secondary | ICD-10-CM | POA: Diagnosis not present

## 2018-11-23 DIAGNOSIS — E1122 Type 2 diabetes mellitus with diabetic chronic kidney disease: Secondary | ICD-10-CM | POA: Diagnosis not present

## 2018-11-23 DIAGNOSIS — I25118 Atherosclerotic heart disease of native coronary artery with other forms of angina pectoris: Secondary | ICD-10-CM | POA: Diagnosis not present

## 2018-11-23 DIAGNOSIS — I5032 Chronic diastolic (congestive) heart failure: Secondary | ICD-10-CM | POA: Diagnosis not present

## 2018-11-24 DIAGNOSIS — I5032 Chronic diastolic (congestive) heart failure: Secondary | ICD-10-CM | POA: Diagnosis not present

## 2018-11-24 DIAGNOSIS — E1122 Type 2 diabetes mellitus with diabetic chronic kidney disease: Secondary | ICD-10-CM | POA: Diagnosis not present

## 2018-11-24 DIAGNOSIS — N183 Chronic kidney disease, stage 3 (moderate): Secondary | ICD-10-CM | POA: Diagnosis not present

## 2018-11-24 DIAGNOSIS — E1143 Type 2 diabetes mellitus with diabetic autonomic (poly)neuropathy: Secondary | ICD-10-CM | POA: Diagnosis not present

## 2018-11-24 DIAGNOSIS — I130 Hypertensive heart and chronic kidney disease with heart failure and stage 1 through stage 4 chronic kidney disease, or unspecified chronic kidney disease: Secondary | ICD-10-CM | POA: Diagnosis not present

## 2018-11-24 DIAGNOSIS — I25118 Atherosclerotic heart disease of native coronary artery with other forms of angina pectoris: Secondary | ICD-10-CM | POA: Diagnosis not present

## 2018-11-25 DIAGNOSIS — E1143 Type 2 diabetes mellitus with diabetic autonomic (poly)neuropathy: Secondary | ICD-10-CM | POA: Diagnosis not present

## 2018-11-25 DIAGNOSIS — E1122 Type 2 diabetes mellitus with diabetic chronic kidney disease: Secondary | ICD-10-CM | POA: Diagnosis not present

## 2018-11-25 DIAGNOSIS — I130 Hypertensive heart and chronic kidney disease with heart failure and stage 1 through stage 4 chronic kidney disease, or unspecified chronic kidney disease: Secondary | ICD-10-CM | POA: Diagnosis not present

## 2018-11-25 DIAGNOSIS — I5032 Chronic diastolic (congestive) heart failure: Secondary | ICD-10-CM | POA: Diagnosis not present

## 2018-11-25 DIAGNOSIS — N183 Chronic kidney disease, stage 3 (moderate): Secondary | ICD-10-CM | POA: Diagnosis not present

## 2018-11-25 DIAGNOSIS — I25118 Atherosclerotic heart disease of native coronary artery with other forms of angina pectoris: Secondary | ICD-10-CM | POA: Diagnosis not present

## 2018-11-27 DIAGNOSIS — E1122 Type 2 diabetes mellitus with diabetic chronic kidney disease: Secondary | ICD-10-CM | POA: Diagnosis not present

## 2018-11-27 DIAGNOSIS — I130 Hypertensive heart and chronic kidney disease with heart failure and stage 1 through stage 4 chronic kidney disease, or unspecified chronic kidney disease: Secondary | ICD-10-CM | POA: Diagnosis not present

## 2018-11-27 DIAGNOSIS — E1143 Type 2 diabetes mellitus with diabetic autonomic (poly)neuropathy: Secondary | ICD-10-CM | POA: Diagnosis not present

## 2018-11-27 DIAGNOSIS — I5032 Chronic diastolic (congestive) heart failure: Secondary | ICD-10-CM | POA: Diagnosis not present

## 2018-11-27 DIAGNOSIS — I25118 Atherosclerotic heart disease of native coronary artery with other forms of angina pectoris: Secondary | ICD-10-CM | POA: Diagnosis not present

## 2018-11-27 DIAGNOSIS — N183 Chronic kidney disease, stage 3 (moderate): Secondary | ICD-10-CM | POA: Diagnosis not present

## 2018-11-30 DIAGNOSIS — I25118 Atherosclerotic heart disease of native coronary artery with other forms of angina pectoris: Secondary | ICD-10-CM | POA: Diagnosis not present

## 2018-11-30 DIAGNOSIS — E1143 Type 2 diabetes mellitus with diabetic autonomic (poly)neuropathy: Secondary | ICD-10-CM | POA: Diagnosis not present

## 2018-11-30 DIAGNOSIS — N183 Chronic kidney disease, stage 3 (moderate): Secondary | ICD-10-CM | POA: Diagnosis not present

## 2018-11-30 DIAGNOSIS — I5032 Chronic diastolic (congestive) heart failure: Secondary | ICD-10-CM | POA: Diagnosis not present

## 2018-11-30 DIAGNOSIS — I130 Hypertensive heart and chronic kidney disease with heart failure and stage 1 through stage 4 chronic kidney disease, or unspecified chronic kidney disease: Secondary | ICD-10-CM | POA: Diagnosis not present

## 2018-11-30 DIAGNOSIS — E1122 Type 2 diabetes mellitus with diabetic chronic kidney disease: Secondary | ICD-10-CM | POA: Diagnosis not present

## 2018-12-03 DIAGNOSIS — N183 Chronic kidney disease, stage 3 (moderate): Secondary | ICD-10-CM | POA: Diagnosis not present

## 2018-12-03 DIAGNOSIS — I5032 Chronic diastolic (congestive) heart failure: Secondary | ICD-10-CM | POA: Diagnosis not present

## 2018-12-03 DIAGNOSIS — I130 Hypertensive heart and chronic kidney disease with heart failure and stage 1 through stage 4 chronic kidney disease, or unspecified chronic kidney disease: Secondary | ICD-10-CM | POA: Diagnosis not present

## 2018-12-03 DIAGNOSIS — E1122 Type 2 diabetes mellitus with diabetic chronic kidney disease: Secondary | ICD-10-CM | POA: Diagnosis not present

## 2018-12-03 DIAGNOSIS — I25118 Atherosclerotic heart disease of native coronary artery with other forms of angina pectoris: Secondary | ICD-10-CM | POA: Diagnosis not present

## 2018-12-03 DIAGNOSIS — E1143 Type 2 diabetes mellitus with diabetic autonomic (poly)neuropathy: Secondary | ICD-10-CM | POA: Diagnosis not present

## 2018-12-15 DIAGNOSIS — Z950 Presence of cardiac pacemaker: Secondary | ICD-10-CM | POA: Diagnosis not present

## 2018-12-15 DIAGNOSIS — I5032 Chronic diastolic (congestive) heart failure: Secondary | ICD-10-CM | POA: Diagnosis not present

## 2018-12-15 DIAGNOSIS — Z7982 Long term (current) use of aspirin: Secondary | ICD-10-CM | POA: Diagnosis not present

## 2018-12-15 DIAGNOSIS — K3184 Gastroparesis: Secondary | ICD-10-CM | POA: Diagnosis not present

## 2018-12-15 DIAGNOSIS — N183 Chronic kidney disease, stage 3 (moderate): Secondary | ICD-10-CM | POA: Diagnosis not present

## 2018-12-15 DIAGNOSIS — I25118 Atherosclerotic heart disease of native coronary artery with other forms of angina pectoris: Secondary | ICD-10-CM | POA: Diagnosis not present

## 2018-12-15 DIAGNOSIS — Z794 Long term (current) use of insulin: Secondary | ICD-10-CM | POA: Diagnosis not present

## 2018-12-15 DIAGNOSIS — K2900 Acute gastritis without bleeding: Secondary | ICD-10-CM | POA: Diagnosis not present

## 2018-12-15 DIAGNOSIS — E1142 Type 2 diabetes mellitus with diabetic polyneuropathy: Secondary | ICD-10-CM | POA: Diagnosis not present

## 2018-12-15 DIAGNOSIS — Z9181 History of falling: Secondary | ICD-10-CM

## 2018-12-15 DIAGNOSIS — I130 Hypertensive heart and chronic kidney disease with heart failure and stage 1 through stage 4 chronic kidney disease, or unspecified chronic kidney disease: Secondary | ICD-10-CM | POA: Diagnosis not present

## 2018-12-15 DIAGNOSIS — E1143 Type 2 diabetes mellitus with diabetic autonomic (poly)neuropathy: Secondary | ICD-10-CM | POA: Diagnosis not present

## 2018-12-15 DIAGNOSIS — E1122 Type 2 diabetes mellitus with diabetic chronic kidney disease: Secondary | ICD-10-CM | POA: Diagnosis not present

## 2018-12-15 DIAGNOSIS — Z96653 Presence of artificial knee joint, bilateral: Secondary | ICD-10-CM

## 2018-12-16 DIAGNOSIS — I5032 Chronic diastolic (congestive) heart failure: Secondary | ICD-10-CM | POA: Diagnosis not present

## 2018-12-16 DIAGNOSIS — E1143 Type 2 diabetes mellitus with diabetic autonomic (poly)neuropathy: Secondary | ICD-10-CM | POA: Diagnosis not present

## 2018-12-16 DIAGNOSIS — N183 Chronic kidney disease, stage 3 (moderate): Secondary | ICD-10-CM | POA: Diagnosis not present

## 2018-12-16 DIAGNOSIS — I25118 Atherosclerotic heart disease of native coronary artery with other forms of angina pectoris: Secondary | ICD-10-CM | POA: Diagnosis not present

## 2018-12-16 DIAGNOSIS — I130 Hypertensive heart and chronic kidney disease with heart failure and stage 1 through stage 4 chronic kidney disease, or unspecified chronic kidney disease: Secondary | ICD-10-CM | POA: Diagnosis not present

## 2018-12-16 DIAGNOSIS — E1122 Type 2 diabetes mellitus with diabetic chronic kidney disease: Secondary | ICD-10-CM | POA: Diagnosis not present

## 2018-12-18 DIAGNOSIS — E1143 Type 2 diabetes mellitus with diabetic autonomic (poly)neuropathy: Secondary | ICD-10-CM | POA: Diagnosis not present

## 2018-12-18 DIAGNOSIS — E1122 Type 2 diabetes mellitus with diabetic chronic kidney disease: Secondary | ICD-10-CM | POA: Diagnosis not present

## 2018-12-18 DIAGNOSIS — I25118 Atherosclerotic heart disease of native coronary artery with other forms of angina pectoris: Secondary | ICD-10-CM | POA: Diagnosis not present

## 2018-12-18 DIAGNOSIS — N183 Chronic kidney disease, stage 3 (moderate): Secondary | ICD-10-CM | POA: Diagnosis not present

## 2018-12-18 DIAGNOSIS — I5032 Chronic diastolic (congestive) heart failure: Secondary | ICD-10-CM | POA: Diagnosis not present

## 2018-12-18 DIAGNOSIS — I130 Hypertensive heart and chronic kidney disease with heart failure and stage 1 through stage 4 chronic kidney disease, or unspecified chronic kidney disease: Secondary | ICD-10-CM | POA: Diagnosis not present

## 2018-12-22 DIAGNOSIS — N183 Chronic kidney disease, stage 3 (moderate): Secondary | ICD-10-CM | POA: Diagnosis not present

## 2018-12-22 DIAGNOSIS — I130 Hypertensive heart and chronic kidney disease with heart failure and stage 1 through stage 4 chronic kidney disease, or unspecified chronic kidney disease: Secondary | ICD-10-CM | POA: Diagnosis not present

## 2018-12-22 DIAGNOSIS — E1122 Type 2 diabetes mellitus with diabetic chronic kidney disease: Secondary | ICD-10-CM | POA: Diagnosis not present

## 2018-12-22 DIAGNOSIS — I25118 Atherosclerotic heart disease of native coronary artery with other forms of angina pectoris: Secondary | ICD-10-CM | POA: Diagnosis not present

## 2018-12-22 DIAGNOSIS — I5032 Chronic diastolic (congestive) heart failure: Secondary | ICD-10-CM | POA: Diagnosis not present

## 2018-12-22 DIAGNOSIS — E1143 Type 2 diabetes mellitus with diabetic autonomic (poly)neuropathy: Secondary | ICD-10-CM | POA: Diagnosis not present

## 2018-12-30 DIAGNOSIS — I25118 Atherosclerotic heart disease of native coronary artery with other forms of angina pectoris: Secondary | ICD-10-CM | POA: Diagnosis not present

## 2018-12-30 DIAGNOSIS — E1143 Type 2 diabetes mellitus with diabetic autonomic (poly)neuropathy: Secondary | ICD-10-CM | POA: Diagnosis not present

## 2018-12-30 DIAGNOSIS — N183 Chronic kidney disease, stage 3 (moderate): Secondary | ICD-10-CM | POA: Diagnosis not present

## 2018-12-30 DIAGNOSIS — I5032 Chronic diastolic (congestive) heart failure: Secondary | ICD-10-CM | POA: Diagnosis not present

## 2018-12-30 DIAGNOSIS — I130 Hypertensive heart and chronic kidney disease with heart failure and stage 1 through stage 4 chronic kidney disease, or unspecified chronic kidney disease: Secondary | ICD-10-CM | POA: Diagnosis not present

## 2018-12-30 DIAGNOSIS — E1122 Type 2 diabetes mellitus with diabetic chronic kidney disease: Secondary | ICD-10-CM | POA: Diagnosis not present

## 2019-01-01 DIAGNOSIS — E1143 Type 2 diabetes mellitus with diabetic autonomic (poly)neuropathy: Secondary | ICD-10-CM | POA: Diagnosis not present

## 2019-01-01 DIAGNOSIS — E1122 Type 2 diabetes mellitus with diabetic chronic kidney disease: Secondary | ICD-10-CM | POA: Diagnosis not present

## 2019-01-01 DIAGNOSIS — I25118 Atherosclerotic heart disease of native coronary artery with other forms of angina pectoris: Secondary | ICD-10-CM | POA: Diagnosis not present

## 2019-01-01 DIAGNOSIS — I5032 Chronic diastolic (congestive) heart failure: Secondary | ICD-10-CM | POA: Diagnosis not present

## 2019-01-01 DIAGNOSIS — I130 Hypertensive heart and chronic kidney disease with heart failure and stage 1 through stage 4 chronic kidney disease, or unspecified chronic kidney disease: Secondary | ICD-10-CM | POA: Diagnosis not present

## 2019-01-01 DIAGNOSIS — N183 Chronic kidney disease, stage 3 (moderate): Secondary | ICD-10-CM | POA: Diagnosis not present

## 2019-01-05 DIAGNOSIS — N183 Chronic kidney disease, stage 3 (moderate): Secondary | ICD-10-CM | POA: Diagnosis not present

## 2019-01-05 DIAGNOSIS — I130 Hypertensive heart and chronic kidney disease with heart failure and stage 1 through stage 4 chronic kidney disease, or unspecified chronic kidney disease: Secondary | ICD-10-CM | POA: Diagnosis not present

## 2019-01-05 DIAGNOSIS — I5032 Chronic diastolic (congestive) heart failure: Secondary | ICD-10-CM | POA: Diagnosis not present

## 2019-01-05 DIAGNOSIS — E1122 Type 2 diabetes mellitus with diabetic chronic kidney disease: Secondary | ICD-10-CM | POA: Diagnosis not present

## 2019-01-05 DIAGNOSIS — I25118 Atherosclerotic heart disease of native coronary artery with other forms of angina pectoris: Secondary | ICD-10-CM | POA: Diagnosis not present

## 2019-01-05 DIAGNOSIS — E1143 Type 2 diabetes mellitus with diabetic autonomic (poly)neuropathy: Secondary | ICD-10-CM | POA: Diagnosis not present

## 2019-01-08 DIAGNOSIS — E1143 Type 2 diabetes mellitus with diabetic autonomic (poly)neuropathy: Secondary | ICD-10-CM | POA: Diagnosis not present

## 2019-01-08 DIAGNOSIS — I5032 Chronic diastolic (congestive) heart failure: Secondary | ICD-10-CM | POA: Diagnosis not present

## 2019-01-08 DIAGNOSIS — N183 Chronic kidney disease, stage 3 (moderate): Secondary | ICD-10-CM | POA: Diagnosis not present

## 2019-01-08 DIAGNOSIS — E1122 Type 2 diabetes mellitus with diabetic chronic kidney disease: Secondary | ICD-10-CM | POA: Diagnosis not present

## 2019-01-08 DIAGNOSIS — I25118 Atherosclerotic heart disease of native coronary artery with other forms of angina pectoris: Secondary | ICD-10-CM | POA: Diagnosis not present

## 2019-01-08 DIAGNOSIS — I130 Hypertensive heart and chronic kidney disease with heart failure and stage 1 through stage 4 chronic kidney disease, or unspecified chronic kidney disease: Secondary | ICD-10-CM | POA: Diagnosis not present

## 2019-02-11 DIAGNOSIS — E782 Mixed hyperlipidemia: Secondary | ICD-10-CM | POA: Diagnosis not present

## 2019-02-11 DIAGNOSIS — Z950 Presence of cardiac pacemaker: Secondary | ICD-10-CM | POA: Diagnosis not present

## 2019-02-11 DIAGNOSIS — I2584 Coronary atherosclerosis due to calcified coronary lesion: Secondary | ICD-10-CM | POA: Diagnosis not present

## 2019-02-11 DIAGNOSIS — N189 Chronic kidney disease, unspecified: Secondary | ICD-10-CM | POA: Diagnosis not present

## 2019-02-11 DIAGNOSIS — Z0189 Encounter for other specified special examinations: Secondary | ICD-10-CM | POA: Diagnosis not present

## 2019-02-11 DIAGNOSIS — E1022 Type 1 diabetes mellitus with diabetic chronic kidney disease: Secondary | ICD-10-CM | POA: Diagnosis not present

## 2019-02-18 DIAGNOSIS — R945 Abnormal results of liver function studies: Secondary | ICD-10-CM | POA: Diagnosis not present

## 2019-02-18 DIAGNOSIS — Z950 Presence of cardiac pacemaker: Secondary | ICD-10-CM | POA: Diagnosis not present

## 2019-02-18 DIAGNOSIS — E1122 Type 2 diabetes mellitus with diabetic chronic kidney disease: Secondary | ICD-10-CM | POA: Diagnosis not present

## 2019-02-18 DIAGNOSIS — D72829 Elevated white blood cell count, unspecified: Secondary | ICD-10-CM | POA: Diagnosis not present

## 2019-02-18 DIAGNOSIS — I2584 Coronary atherosclerosis due to calcified coronary lesion: Secondary | ICD-10-CM | POA: Diagnosis not present

## 2019-02-18 DIAGNOSIS — N184 Chronic kidney disease, stage 4 (severe): Secondary | ICD-10-CM | POA: Diagnosis not present

## 2019-02-18 DIAGNOSIS — Z0000 Encounter for general adult medical examination without abnormal findings: Secondary | ICD-10-CM | POA: Diagnosis not present

## 2019-02-18 DIAGNOSIS — E782 Mixed hyperlipidemia: Secondary | ICD-10-CM | POA: Diagnosis not present

## 2019-02-18 DIAGNOSIS — I129 Hypertensive chronic kidney disease with stage 1 through stage 4 chronic kidney disease, or unspecified chronic kidney disease: Secondary | ICD-10-CM | POA: Diagnosis not present

## 2019-02-21 DIAGNOSIS — M79644 Pain in right finger(s): Secondary | ICD-10-CM | POA: Diagnosis not present

## 2019-02-21 DIAGNOSIS — M79645 Pain in left finger(s): Secondary | ICD-10-CM | POA: Diagnosis not present

## 2019-03-15 ENCOUNTER — Encounter: Payer: Self-pay | Admitting: Internal Medicine

## 2019-03-18 ENCOUNTER — Telehealth: Payer: Self-pay | Admitting: *Deleted

## 2019-03-18 NOTE — Telephone Encounter (Signed)
Pt consented to telehealth appt with Dr Bronson Ing tomorrow. Medications reviewed and pt is asking for refills on cardiac medications - updated pharmacy/allergies. Pt doesn't have scale or BP monitor at home.

## 2019-03-19 ENCOUNTER — Telehealth (INDEPENDENT_AMBULATORY_CARE_PROVIDER_SITE_OTHER): Payer: Medicare Other | Admitting: Cardiovascular Disease

## 2019-03-19 ENCOUNTER — Encounter: Payer: Self-pay | Admitting: Cardiovascular Disease

## 2019-03-19 VITALS — Ht 64.0 in | Wt 179.0 lb

## 2019-03-19 DIAGNOSIS — I1310 Hypertensive heart and chronic kidney disease without heart failure, with stage 1 through stage 4 chronic kidney disease, or unspecified chronic kidney disease: Secondary | ICD-10-CM | POA: Diagnosis not present

## 2019-03-19 DIAGNOSIS — Z7189 Other specified counseling: Secondary | ICD-10-CM

## 2019-03-19 DIAGNOSIS — Z79899 Other long term (current) drug therapy: Secondary | ICD-10-CM | POA: Diagnosis not present

## 2019-03-19 DIAGNOSIS — Z950 Presence of cardiac pacemaker: Secondary | ICD-10-CM

## 2019-03-19 DIAGNOSIS — Z87891 Personal history of nicotine dependence: Secondary | ICD-10-CM

## 2019-03-19 DIAGNOSIS — N183 Chronic kidney disease, stage 3 unspecified: Secondary | ICD-10-CM

## 2019-03-19 DIAGNOSIS — Z7982 Long term (current) use of aspirin: Secondary | ICD-10-CM | POA: Diagnosis not present

## 2019-03-19 DIAGNOSIS — I10 Essential (primary) hypertension: Secondary | ICD-10-CM

## 2019-03-19 DIAGNOSIS — R55 Syncope and collapse: Secondary | ICD-10-CM | POA: Diagnosis not present

## 2019-03-19 DIAGNOSIS — I25118 Atherosclerotic heart disease of native coronary artery with other forms of angina pectoris: Secondary | ICD-10-CM

## 2019-03-19 MED ORDER — ROSUVASTATIN CALCIUM 5 MG PO TABS: 5.0000 mg | ORAL_TABLET | Freq: Every day | ORAL | 3 refills | Status: DC

## 2019-03-19 MED ORDER — HYDRALAZINE HCL 50 MG PO TABS: 50.0000 mg | ORAL_TABLET | Freq: Three times a day (TID) | ORAL | 3 refills | Status: DC

## 2019-03-19 MED ORDER — AMLODIPINE BESYLATE 10 MG PO TABS: 10.0000 mg | ORAL_TABLET | Freq: Every day | ORAL | 3 refills | Status: DC

## 2019-03-19 MED ORDER — CARVEDILOL 12.5 MG PO TABS: 12.5000 mg | ORAL_TABLET | Freq: Two times a day (BID) | ORAL | 3 refills | Status: DC

## 2019-03-19 NOTE — Progress Notes (Signed)
Virtual Visit via Telephone Note   This visit type was conducted due to national recommendations for restrictions regarding the COVID-19 Pandemic (e.g. social distancing) in an effort to limit this patient's exposure and mitigate transmission in our community.  Due to her co-morbid illnesses, this patient is at least at moderate risk for complications without adequate follow up.  This format is felt to be most appropriate for this patient at this time.  The patient did not have access to video technology/had technical difficulties with video requiring transitioning to audio format only (telephone).  All issues noted in this document were discussed and addressed.  No physical exam could be performed with this format.  Please refer to the patient's chart for her  consent to telehealth for [facility].   Evaluation Performed:  Follow-up visit  Date:  03/19/2019   ID:  Melissa Lopez, DOB [birth year], MRN [id number]  Patient Location: Home  Provider Location: Home  PCP:  Celene Squibb, MD  Cardiologist:  No primary care provider on file.  Electrophysiologist:  None   Chief Complaint:  CAD, pacemaker  History of Present Illness:    Melissa Lopez is a 68 y.o. female who presents via audio/video conferencing for a telehealth visit today.    This is my first time talking to her. She is originally from [place]. She has now moved to [facility] and lives with her daughter.  She sees Dr. [place] Crane as her PCP. She has apparently been referred to nephrology by her PCP.  I reviewed labs dated 02/11/2019.  She has a history of [employer] pacemaker for treatment of apparent symptomatic bradycardia, CKD stage III, CAD, hypertension, and type 2 diabetes mellitus with nephropathy.  She was hospitalized in Nov/Dec 2019 for chest pain, troponin elevation, and syncope. I personally reviewed all records. Cardiac studies reviewed below.  GI evaluated her and there was a high suspicion for gastroparesis and marijuana use  as source of nausea and vomiting.  She says she has chronic fatigue. She denies chest pain and shortness of breath. She denies recurrent syncope.  She says she's only been getting 30 pills at at time from pharmacy.  The patient does not have symptoms concerning for COVID-19 infection (fever, chills, cough, or new shortness of breath).    Past Medical History:  Diagnosis Date  . CKD (chronic kidney disease) stage 3, GFR 30-59 ml/min (HCC) 10/31/2018   - per patient report  . Coronary artery disease    per patient report - 2 caths with non-obstructive CAD  . Essential hypertension   . H/O cardiac pacemaker 10/31/2018  . Type 2 diabetes mellitus with complication, with long-term current use of insulin ([place]) 10/31/2018   with diabetic nephropathy   Past Surgical History:  Procedure Laterality Date  . CARDIAC PACEMAKER PLACEMENT     in [place]  . ESOPHAGOGASTRODUODENOSCOPY (EGD) WITH PROPOFOL N/A 11/10/2018   Procedure: ESOPHAGOGASTRODUODENOSCOPY (EGD) WITH PROPOFOL;  Surgeon: Wilford Corner, MD;  Location: [facility];  Service: Endoscopy;  Laterality: N/A;  . LEFT HEART CATH AND CORONARY ANGIOGRAPHY N/A 10/31/2018   Procedure: LEFT HEART CATH AND CORONARY ANGIOGRAPHY;  Surgeon: Leonie Man, MD;  Location: [facility];  Service: Cardiovascular;  Laterality: N/A;     Current Meds  Medication Sig  . amLODipine (NORVASC) 10 MG tablet Take 1 tablet (10 mg total) by mouth daily.  Marland Kitchen aspirin 81 MG chewable tablet Chew 1 tablet (81 mg total) by mouth daily.  . carvedilol (COREG) 12.5 MG  tablet Take 1 tablet (12.5 mg total) by mouth 2 (two) times daily with a meal.  . gabapentin (NEURONTIN) 300 MG capsule Take 300 mg by mouth 3 (three) times daily.  . hydrALAZINE (APRESOLINE) 50 MG tablet Take 1 tablet (50 mg total) by mouth every 8 (eight) hours.  . insulin aspart (NOVOLOG) 100 UNIT/ML injection Inject 0-20 Units into the skin 3 (three) times daily with meals.  . insulin  aspart (NOVOLOG) 100 UNIT/ML injection Inject 0-5 Units into the skin at bedtime.  . insulin aspart protamine- aspart (NOVOLOG MIX 70/30) (70-30) 100 UNIT/ML injection Inject 0.12 mLs (12 Units total) into the skin 2 (two) times daily with a meal.  . metoCLOPramide (REGLAN) 5 MG tablet Take 1 tablet (5 mg total) by mouth every 6 (six) hours.  Marland Kitchen omeprazole (PRILOSEC) 20 MG capsule Take 20 mg by mouth daily.  . rosuvastatin (CRESTOR) 5 MG tablet Take 1 tablet (5 mg total) by mouth daily at 6 PM.  . simethicone (MYLICON) 532 MG chewable tablet Chew 125-250 mg by mouth every 6 (six) hours as needed for flatulence.  . traZODone (DESYREL) 100 MG tablet Take 100 mg by mouth at bedtime.     Allergies:   Percocet [oxycodone-acetaminophen]   Social History   Tobacco Use  . Smoking status: Former Smoker    Years: 3.00    Last attempt to quit: 12/09/1969    Years since quitting: 49.3  . Smokeless tobacco: Never Used  Substance Use Topics  . Alcohol use: Not on file  . Drug use: Not on file     Family Hx: The patient's Family history is unknown by patient.  ROS:   Please see the history of present illness.     All other systems reviewed and are negative.   Prior CV studies:   The following studies were reviewed today:  Cardiac catheterization 99/24/2683:  LV end diastolic pressure is mildly elevated.  Ost Ramus lesion is 70% stenosed -relatively small caliber vessel. Not likely because of resting chest pain and ST elevations.  Ost 1st Diag lesion is 60% stenosed. -Not likely flow-limiting.  Very small caliber (<5mm) 1st Mrg lesion appears to be 100% stenosed with faint collateralization. Too small for angioplasty or stenting.  SUMMARY:  Angiographically mild diffuse disease, only potentially occluded vessels are very very small branch of the major obtuse marginal. Not likely a culprit lesion for significant anginal chest pain.  Mild to moderately elevated LVEDP. LV gram not  performed due to renal insufficiency.  Echocardiogram 10/31/2018: Study Conclusions  - Left ventricle: The cavity size was normal. Wall thickness was increased in a pattern of severe LVH. Systolic function was normal. The estimated ejection fraction was in the range of 55% to 60%. There is hypokinesis of the apical myocardium. Doppler parameters are consistent with abnormal left ventricular relaxation (grade 1 diastolic dysfunction). - Pericardium, extracardiac: A trivial pericardial effusion was identified.  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  Labs/Other Tests and Data Reviewed:    EKG:  NA  Recent Labs: 11/04/2018: TSH 1.475 11/05/2018: ALT 51 11/10/2018: Hemoglobin 14.9; Platelets 241 11/12/2018: BUN 17; Creatinine, Ser 1.76; Potassium 3.8; Sodium 134   Recent Lipid Panel Lab Results  Component Value Date/Time   CHOL 153 11/01/2018 05:16 AM   TRIG 175 (H) 11/01/2018 05:16 AM   HDL 24 (L) 11/01/2018 05:16  AM   CHOLHDL 6.4 11/01/2018 05:16 AM   LDLCALC 94 11/01/2018 05:16 AM    Wt Readings from Last 3 Encounters:  03/19/19 179 lb (81.2 kg)  11/10/18 182 lb 4.8 oz (82.7 kg)     Objective:    Vital Signs:  Ht 5\' 4"  (1.626 m)   Wt 179 lb (81.2 kg)   BMI 30.73 kg/m    Phone visit  ASSESSMENT & PLAN:    1. CAD: Symptomatically stable. Cath reviewed above with recommendations for medical therapy. On ASA, statin, and beta blocker. I will prescribe 90-day supply.  2. HTN: Will need continued monitoring. No BP cuff at home.  3. CKD stage III/IV: SCr 1.98 on 02/11/19. Referred to nephrology by PCP.  4. History of syncope: No arrhythmias while hospitalized. No recurrent episodes.  5. Pacemaker: I will enroll in device clinic. Generator change  in 09/2018 in [place].   COVID-19 Education: The signs and symptoms of COVID-19 were discussed with the patient and how to seek care for testing (follow up with PCP or arrange E-visit).  The importance of social distancing was discussed today.  Time:   Today, I have spent 25 minutes with the patient with telehealth technology discussing the above problems.     Medication Adjustments/Labs and Tests Ordered: Current medicines are reviewed at length with the patient today.  Concerns regarding medicines are outlined above.  Tests Ordered: No orders of the defined types were placed in this encounter.  Medication Changes: No orders of the defined types were placed in this encounter.   Disposition:  Follow up 6 months with Dr. Domenic Polite (patient evaluated by him while hospitalized)  Signed, Kate Sable, MD  03/19/2019 10:18 AM    [place]

## 2019-03-19 NOTE — Addendum Note (Signed)
Addended by: Laurine Blazer on: 03/19/2019 11:00 AM   Modules accepted: Orders

## 2019-03-19 NOTE — Patient Instructions (Addendum)
Medication Instructions:   Continue all current medications.  Refills for your cardiac medications have been sent to [facility] today.  Labwork: none  Testing/Procedures: none  Follow-Up: Your physician wants you to follow up in: 6 months.  You will receive a reminder letter in the mail one-two months in advance.  If you don't receive a letter, please call our office to schedule the follow up appointment.  (Dr. Domenic Polite)    Any Other Special Instructions Will Be Listed Below (If Applicable). Enroll in our device clinic.  Your first visit may have to be in our [place] office as this is where Dr. Rayann Heman sees his new patients at first.  After that, your follow up afterwards can be back in the [place] office.    If you need a refill on your cardiac medications before your next appointment, please call your pharmacy.

## 2019-03-25 DIAGNOSIS — L255 Unspecified contact dermatitis due to plants, except food: Secondary | ICD-10-CM | POA: Diagnosis not present

## 2019-04-07 DIAGNOSIS — R001 Bradycardia, unspecified: Secondary | ICD-10-CM | POA: Diagnosis not present

## 2019-04-15 ENCOUNTER — Ambulatory Visit (INDEPENDENT_AMBULATORY_CARE_PROVIDER_SITE_OTHER): Payer: Medicare Other | Admitting: *Deleted

## 2019-04-15 ENCOUNTER — Other Ambulatory Visit: Payer: Self-pay

## 2019-04-15 DIAGNOSIS — I25118 Atherosclerotic heart disease of native coronary artery with other forms of angina pectoris: Secondary | ICD-10-CM

## 2019-04-15 LAB — CUP PACEART REMOTE DEVICE CHECK
Date Time Interrogation Session: 20200507105516
Implantable Lead Implant Date: 20110113
Implantable Lead Implant Date: 20110113
Implantable Lead Location: 753859
Implantable Lead Location: 753860
Implantable Pulse Generator Implant Date: 20191008
Pulse Gen Model: 2272
Pulse Gen Serial Number: 9064940

## 2019-04-16 ENCOUNTER — Telehealth: Payer: Self-pay

## 2019-04-16 NOTE — Telephone Encounter (Signed)
Spoke with pt and pts daughter regarding appt on 04/19/19. Pts daughter stated pt can not check vitals. Pt questions and con cerns were address.

## 2019-04-19 ENCOUNTER — Telehealth (INDEPENDENT_AMBULATORY_CARE_PROVIDER_SITE_OTHER): Payer: Medicare Other | Admitting: Internal Medicine

## 2019-04-19 ENCOUNTER — Encounter: Payer: Self-pay | Admitting: Internal Medicine

## 2019-04-19 DIAGNOSIS — I10 Essential (primary) hypertension: Secondary | ICD-10-CM | POA: Diagnosis not present

## 2019-04-19 DIAGNOSIS — I25118 Atherosclerotic heart disease of native coronary artery with other forms of angina pectoris: Secondary | ICD-10-CM

## 2019-04-19 DIAGNOSIS — I495 Sick sinus syndrome: Secondary | ICD-10-CM

## 2019-04-19 DIAGNOSIS — Z950 Presence of cardiac pacemaker: Secondary | ICD-10-CM

## 2019-04-19 NOTE — Progress Notes (Signed)
Electrophysiology TeleHealth Note   Due to national recommendations of social distancing due to COVID 19, an audio/video telehealth visit is felt to be most appropriate for this patient at this time.  See MyChart message from today for the patient's consent to telehealth for [facility].   Date:  04/19/2019   ID:  Melissa Lopez, DOB [birth year], MRN [id number]  Location: patient's home  Provider location:  [place]  Evaluation Performed: Follow-up visit  PCP:  Celene Squibb, MD  Cardiologist:  Dr Bronson Ing Electrophysiologist:  Dr Rayann Heman  Chief Complaint:  HTN  History of Present Illness:    Melissa Lopez is a 68 y.o. female who presents via audio/video conferencing for a telehealth visit today.  She recently moved from [place] to [place] to live with her daughter. She is s/p PPM ([place]) 2008 with generator change 2019.  She has done well since her PPM implant.   Today, she denies symptoms of palpitations, chest pain, shortness of breath,  lower extremity edema, dizziness, presyncope, or syncope.  The patient is otherwise without complaint today.  The patient denies symptoms of fevers, chills, cough, or new SOB worrisome for COVID 19.  Past Medical History:  Diagnosis Date  . CKD (chronic kidney disease) stage 3, GFR 30-59 ml/min (HCC) 10/31/2018   - per patient report  . Coronary artery disease    per patient report - 2 caths with non-obstructive CAD  . Essential hypertension   . H/O cardiac pacemaker 10/31/2018  . Sick sinus syndrome ([place])   . Type 2 diabetes mellitus with complication, with long-term current use of insulin ([place]) 10/31/2018   with diabetic nephropathy    Past Surgical History:  Procedure Laterality Date  . CARDIAC PACEMAKER PLACEMENT  2008   in [place] (SJM) for sick sinus syndrome  . ESOPHAGOGASTRODUODENOSCOPY (EGD) WITH PROPOFOL N/A 11/10/2018   Procedure: ESOPHAGOGASTRODUODENOSCOPY (EGD) WITH PROPOFOL;  Surgeon: Wilford Corner, MD;  Location:  [facility];  Service: Endoscopy;  Laterality: N/A;  . LEFT HEART CATH AND CORONARY ANGIOGRAPHY N/A 10/31/2018   Procedure: LEFT HEART CATH AND CORONARY ANGIOGRAPHY;  Surgeon: Leonie Man, MD;  Location: [facility];  Service: Cardiovascular;  Laterality: N/A;  . PACEMAKER GENERATOR CHANGE  09/2018   in [place] (SJM)    Current Outpatient Medications  Medication Sig Dispense Refill  . amLODipine (NORVASC) 10 MG tablet Take 1 tablet (10 mg total) by mouth daily. 90 tablet 3  . aspirin 81 MG chewable tablet Chew 1 tablet (81 mg total) by mouth daily. 30 tablet 0  . carvedilol (COREG) 12.5 MG tablet Take 1 tablet (12.5 mg total) by mouth 2 (two) times daily with a meal. 180 tablet 3  . gabapentin (NEURONTIN) 300 MG capsule Take 300 mg by mouth 3 (three) times daily.    . hydrALAZINE (APRESOLINE) 50 MG tablet Take 50 mg by mouth 2 (two) times daily.    . insulin aspart (NOVOLOG FLEXPEN) 100 UNIT/ML FlexPen Inject into the skin. Use as directed    . lisinopril (ZESTRIL) 40 MG tablet Take 1 tablet by mouth daily.    . metoCLOPramide (REGLAN) 5 MG tablet Take 1 tablet (5 mg total) by mouth every 6 (six) hours. 120 tablet 0  . omeprazole (PRILOSEC) 20 MG capsule Take 20 mg by mouth daily.    Marland Kitchen oxybutynin (DITROPAN-XL) 10 MG 24 hr tablet Take 10 mg by mouth at bedtime.    . rosuvastatin (CRESTOR) 5 MG tablet Take 1  tablet (5 mg total) by mouth daily at 6 PM. 90 tablet 3  . simethicone (MYLICON) 710 MG chewable tablet Chew 125-250 mg by mouth every 6 (six) hours as needed for flatulence.    . traZODone (DESYREL) 100 MG tablet Take 100 mg by mouth at bedtime.     No current facility-administered medications for this visit.     Allergies:   Percocet [oxycodone-acetaminophen]   Social History:  The patient  reports that she quit smoking about 49 years ago. She quit after 3.00 years of use. She has never used smokeless tobacco. She reports previous alcohol use. She reports that she does  not use drugs.   Family History:  The patient's father died in his 54s of CAD  ROS:  Please see the history of present illness.   All other systems are personally reviewed and negative.    Exam:    Vital Signs:  There were no vitals taken for this visit.  Well appearing, alert and conversant, regular work of breathing,  good skin color Eyes- anicteric, neuro- grossly intact, skin- no apparent rash or lesions or cyanosis, mouth- oral mucosa is pink   Labs/Other Tests and Data Reviewed:    Recent Labs: 11/04/2018: TSH 1.475 11/05/2018: ALT 51 11/10/2018: Hemoglobin 14.9; Platelets 241 11/12/2018: BUN 17; Creatinine, Ser 1.76; Potassium 3.8; Sodium 134   Wt Readings from Last 3 Encounters:  03/19/19 179 lb (81.2 kg)  11/10/18 182 lb 4.8 oz (82.7 kg)     Other studies personally reviewed: Additional studies/ records that were reviewed today include: Dr Court Joy notes  Review of the above records today demonstrates: as above   Last device remote is reviewed from [place] PDF dated 04/15/2019 which reveals normal device function, no arrhythmias    ASSESSMENT & PLAN:    1.  Sick sinus syndrome Normal pacemaker function See Pace Art report No changes today she is not device dependant today  2. HTN Stable No change required today  3. COVID 19 screen The patient denies symptoms of COVID 19 at this time.  The importance of social distancing was discussed today.  Follow-up:  With me in a year Next remote: 07/2019  Current medicines are reviewed at length with the patient today.   The patient does not have concerns regarding her medicines.  The following changes were made today:  none  Labs/ tests ordered today include:  No orders of the defined types were placed in this encounter.  Patient Risk:  after full review of this patients clinical status, I feel that they are at moderate risk at this time.  Today, I have spent 15 minutes with the patient with telehealth  technology discussing PPM follow-up .    Army Fossa, MD  04/19/2019 12:58 PM     [place] [place] [address] [place] [id number] [phone] (office) [phone] (fax)

## 2019-04-20 ENCOUNTER — Encounter: Payer: Self-pay | Admitting: Cardiology

## 2019-04-20 NOTE — Progress Notes (Signed)
Remote pacemaker transmission.   

## 2019-04-29 DIAGNOSIS — N179 Acute kidney failure, unspecified: Secondary | ICD-10-CM | POA: Insufficient documentation

## 2019-04-29 DIAGNOSIS — E1121 Type 2 diabetes mellitus with diabetic nephropathy: Secondary | ICD-10-CM | POA: Diagnosis not present

## 2019-04-29 DIAGNOSIS — E8889 Other specified metabolic disorders: Secondary | ICD-10-CM | POA: Diagnosis not present

## 2019-04-29 DIAGNOSIS — N184 Chronic kidney disease, stage 4 (severe): Secondary | ICD-10-CM | POA: Diagnosis not present

## 2019-05-05 ENCOUNTER — Other Ambulatory Visit: Payer: Self-pay | Admitting: Nephrology

## 2019-05-05 ENCOUNTER — Other Ambulatory Visit (HOSPITAL_COMMUNITY): Payer: Self-pay | Admitting: Nephrology

## 2019-05-05 DIAGNOSIS — N184 Chronic kidney disease, stage 4 (severe): Secondary | ICD-10-CM

## 2019-05-25 ENCOUNTER — Encounter (HOSPITAL_COMMUNITY): Payer: Self-pay

## 2019-05-25 ENCOUNTER — Ambulatory Visit (HOSPITAL_COMMUNITY): Admission: RE | Admit: 2019-05-25 | Payer: Medicare Other | Source: Ambulatory Visit

## 2019-07-15 ENCOUNTER — Ambulatory Visit (INDEPENDENT_AMBULATORY_CARE_PROVIDER_SITE_OTHER): Payer: Medicare Other | Admitting: *Deleted

## 2019-07-15 DIAGNOSIS — I495 Sick sinus syndrome: Secondary | ICD-10-CM | POA: Diagnosis not present

## 2019-07-16 ENCOUNTER — Telehealth: Payer: Self-pay

## 2019-07-16 NOTE — Telephone Encounter (Signed)
Left message for patient to remind of missed remote transmission.  

## 2019-07-18 LAB — CUP PACEART REMOTE DEVICE CHECK
Battery Remaining Longevity: 118 mo
Battery Remaining Percentage: 95.5 %
Battery Voltage: 3.01 V
Brady Statistic AP VP Percent: 44 %
Brady Statistic AP VS Percent: 53 %
Brady Statistic AS VP Percent: 1 %
Brady Statistic AS VS Percent: 3.4 %
Brady Statistic RA Percent Paced: 96 %
Brady Statistic RV Percent Paced: 44 %
Date Time Interrogation Session: 20200808060014
Implantable Lead Implant Date: 20110113
Implantable Lead Implant Date: 20110113
Implantable Lead Location: 753859
Implantable Lead Location: 753860
Implantable Pulse Generator Implant Date: 20191008
Lead Channel Impedance Value: 490 Ohm
Lead Channel Impedance Value: 630 Ohm
Lead Channel Pacing Threshold Amplitude: 0.625 V
Lead Channel Pacing Threshold Amplitude: 1.125 V
Lead Channel Pacing Threshold Pulse Width: 0.4 ms
Lead Channel Pacing Threshold Pulse Width: 0.4 ms
Lead Channel Sensing Intrinsic Amplitude: 12 mV
Lead Channel Sensing Intrinsic Amplitude: 4.3 mV
Lead Channel Setting Pacing Amplitude: 1.375
Lead Channel Setting Pacing Amplitude: 1.625
Lead Channel Setting Pacing Pulse Width: 0.4 ms
Lead Channel Setting Sensing Sensitivity: 2 mV
Pulse Gen Model: 2272
Pulse Gen Serial Number: 9064940

## 2019-07-20 ENCOUNTER — Encounter: Payer: Self-pay | Admitting: Cardiology

## 2019-07-20 NOTE — Progress Notes (Signed)
Remote pacemaker transmission.   

## 2019-07-30 DIAGNOSIS — D72829 Elevated white blood cell count, unspecified: Secondary | ICD-10-CM | POA: Diagnosis not present

## 2019-07-30 DIAGNOSIS — N184 Chronic kidney disease, stage 4 (severe): Secondary | ICD-10-CM | POA: Diagnosis not present

## 2019-07-30 DIAGNOSIS — R946 Abnormal results of thyroid function studies: Secondary | ICD-10-CM | POA: Diagnosis not present

## 2019-07-30 DIAGNOSIS — E782 Mixed hyperlipidemia: Secondary | ICD-10-CM | POA: Diagnosis not present

## 2019-07-30 DIAGNOSIS — E1122 Type 2 diabetes mellitus with diabetic chronic kidney disease: Secondary | ICD-10-CM | POA: Diagnosis not present

## 2019-08-03 DIAGNOSIS — E782 Mixed hyperlipidemia: Secondary | ICD-10-CM | POA: Diagnosis not present

## 2019-08-03 DIAGNOSIS — E1122 Type 2 diabetes mellitus with diabetic chronic kidney disease: Secondary | ICD-10-CM | POA: Diagnosis not present

## 2019-08-03 DIAGNOSIS — F331 Major depressive disorder, recurrent, moderate: Secondary | ICD-10-CM | POA: Diagnosis not present

## 2019-08-03 DIAGNOSIS — Z950 Presence of cardiac pacemaker: Secondary | ICD-10-CM | POA: Diagnosis not present

## 2019-08-03 DIAGNOSIS — D72829 Elevated white blood cell count, unspecified: Secondary | ICD-10-CM | POA: Diagnosis not present

## 2019-08-03 DIAGNOSIS — I2584 Coronary atherosclerosis due to calcified coronary lesion: Secondary | ICD-10-CM | POA: Diagnosis not present

## 2019-08-03 DIAGNOSIS — F411 Generalized anxiety disorder: Secondary | ICD-10-CM | POA: Diagnosis not present

## 2019-08-03 DIAGNOSIS — I129 Hypertensive chronic kidney disease with stage 1 through stage 4 chronic kidney disease, or unspecified chronic kidney disease: Secondary | ICD-10-CM | POA: Diagnosis not present

## 2019-08-03 DIAGNOSIS — N184 Chronic kidney disease, stage 4 (severe): Secondary | ICD-10-CM | POA: Diagnosis not present

## 2019-08-03 DIAGNOSIS — R945 Abnormal results of liver function studies: Secondary | ICD-10-CM | POA: Diagnosis not present

## 2019-08-10 DIAGNOSIS — N390 Urinary tract infection, site not specified: Secondary | ICD-10-CM | POA: Diagnosis not present

## 2019-08-10 DIAGNOSIS — R300 Dysuria: Secondary | ICD-10-CM | POA: Diagnosis not present

## 2019-08-10 DIAGNOSIS — N10 Acute tubulo-interstitial nephritis: Secondary | ICD-10-CM | POA: Diagnosis not present

## 2019-08-10 DIAGNOSIS — M545 Low back pain: Secondary | ICD-10-CM | POA: Diagnosis not present

## 2019-08-17 DIAGNOSIS — R300 Dysuria: Secondary | ICD-10-CM | POA: Diagnosis not present

## 2019-08-17 DIAGNOSIS — R319 Hematuria, unspecified: Secondary | ICD-10-CM | POA: Diagnosis not present

## 2019-08-17 DIAGNOSIS — E1122 Type 2 diabetes mellitus with diabetic chronic kidney disease: Secondary | ICD-10-CM | POA: Diagnosis not present

## 2019-08-17 DIAGNOSIS — F331 Major depressive disorder, recurrent, moderate: Secondary | ICD-10-CM | POA: Diagnosis not present

## 2019-08-17 DIAGNOSIS — N184 Chronic kidney disease, stage 4 (severe): Secondary | ICD-10-CM | POA: Diagnosis not present

## 2019-08-17 DIAGNOSIS — I129 Hypertensive chronic kidney disease with stage 1 through stage 4 chronic kidney disease, or unspecified chronic kidney disease: Secondary | ICD-10-CM | POA: Diagnosis not present

## 2019-08-17 DIAGNOSIS — F411 Generalized anxiety disorder: Secondary | ICD-10-CM | POA: Diagnosis not present

## 2019-10-14 ENCOUNTER — Ambulatory Visit (INDEPENDENT_AMBULATORY_CARE_PROVIDER_SITE_OTHER): Payer: Medicare Other | Admitting: *Deleted

## 2019-10-14 DIAGNOSIS — R55 Syncope and collapse: Secondary | ICD-10-CM

## 2019-10-16 LAB — CUP PACEART REMOTE DEVICE CHECK
Battery Remaining Longevity: 125 mo
Battery Remaining Percentage: 95.5 %
Battery Voltage: 3.01 V
Brady Statistic AP VP Percent: 48 %
Brady Statistic AP VS Percent: 49 %
Brady Statistic AS VP Percent: 1 %
Brady Statistic AS VS Percent: 2.7 %
Brady Statistic RA Percent Paced: 97 %
Brady Statistic RV Percent Paced: 48 %
Date Time Interrogation Session: 20201107084603
Implantable Lead Implant Date: 20110113
Implantable Lead Implant Date: 20110113
Implantable Lead Location: 753859
Implantable Lead Location: 753860
Implantable Pulse Generator Implant Date: 20191008
Lead Channel Impedance Value: 480 Ohm
Lead Channel Impedance Value: 600 Ohm
Lead Channel Pacing Threshold Amplitude: 0.625 V
Lead Channel Pacing Threshold Amplitude: 1.125 V
Lead Channel Pacing Threshold Pulse Width: 0.4 ms
Lead Channel Pacing Threshold Pulse Width: 0.4 ms
Lead Channel Sensing Intrinsic Amplitude: 12 mV
Lead Channel Sensing Intrinsic Amplitude: 3.9 mV
Lead Channel Setting Pacing Amplitude: 1.375
Lead Channel Setting Pacing Amplitude: 1.625
Lead Channel Setting Pacing Pulse Width: 0.4 ms
Lead Channel Setting Sensing Sensitivity: 2 mV
Pulse Gen Model: 2272
Pulse Gen Serial Number: 9064940

## 2019-10-22 NOTE — Progress Notes (Signed)
Remote pacemaker transmission.   

## 2020-01-13 ENCOUNTER — Ambulatory Visit (INDEPENDENT_AMBULATORY_CARE_PROVIDER_SITE_OTHER): Payer: Medicare Other | Admitting: *Deleted

## 2020-01-13 DIAGNOSIS — R55 Syncope and collapse: Secondary | ICD-10-CM | POA: Diagnosis not present

## 2020-01-15 LAB — CUP PACEART REMOTE DEVICE CHECK
Battery Remaining Longevity: 125 mo
Battery Remaining Percentage: 95.5 %
Battery Voltage: 3.01 V
Brady Statistic AP VP Percent: 50 %
Brady Statistic AP VS Percent: 47 %
Brady Statistic AS VP Percent: 1 %
Brady Statistic AS VS Percent: 2.6 %
Brady Statistic RA Percent Paced: 97 %
Brady Statistic RV Percent Paced: 50 %
Date Time Interrogation Session: 20210206020012
Implantable Lead Implant Date: 20110113
Implantable Lead Implant Date: 20110113
Implantable Lead Location: 753859
Implantable Lead Location: 753860
Implantable Pulse Generator Implant Date: 20191008
Lead Channel Impedance Value: 480 Ohm
Lead Channel Impedance Value: 610 Ohm
Lead Channel Pacing Threshold Amplitude: 0.625 V
Lead Channel Pacing Threshold Amplitude: 1.125 V
Lead Channel Pacing Threshold Pulse Width: 0.4 ms
Lead Channel Pacing Threshold Pulse Width: 0.4 ms
Lead Channel Sensing Intrinsic Amplitude: 12 mV
Lead Channel Sensing Intrinsic Amplitude: 3.9 mV
Lead Channel Setting Pacing Amplitude: 1.375
Lead Channel Setting Pacing Amplitude: 1.625
Lead Channel Setting Pacing Pulse Width: 0.4 ms
Lead Channel Setting Sensing Sensitivity: 2 mV
Pulse Gen Model: 2272
Pulse Gen Serial Number: 9064940

## 2020-02-24 DIAGNOSIS — Z96653 Presence of artificial knee joint, bilateral: Secondary | ICD-10-CM | POA: Diagnosis not present

## 2020-02-24 DIAGNOSIS — N3281 Overactive bladder: Secondary | ICD-10-CM | POA: Diagnosis not present

## 2020-02-24 DIAGNOSIS — E1022 Type 1 diabetes mellitus with diabetic chronic kidney disease: Secondary | ICD-10-CM | POA: Diagnosis not present

## 2020-02-24 DIAGNOSIS — R32 Unspecified urinary incontinence: Secondary | ICD-10-CM | POA: Diagnosis not present

## 2020-02-24 DIAGNOSIS — N189 Chronic kidney disease, unspecified: Secondary | ICD-10-CM | POA: Diagnosis not present

## 2020-02-24 DIAGNOSIS — I252 Old myocardial infarction: Secondary | ICD-10-CM | POA: Diagnosis not present

## 2020-02-24 DIAGNOSIS — E782 Mixed hyperlipidemia: Secondary | ICD-10-CM | POA: Diagnosis not present

## 2020-02-24 DIAGNOSIS — N10 Acute tubulo-interstitial nephritis: Secondary | ICD-10-CM | POA: Diagnosis not present

## 2020-02-24 DIAGNOSIS — Z0189 Encounter for other specified special examinations: Secondary | ICD-10-CM | POA: Diagnosis not present

## 2020-02-24 DIAGNOSIS — D72829 Elevated white blood cell count, unspecified: Secondary | ICD-10-CM | POA: Diagnosis not present

## 2020-02-24 DIAGNOSIS — I2584 Coronary atherosclerosis due to calcified coronary lesion: Secondary | ICD-10-CM | POA: Diagnosis not present

## 2020-02-24 DIAGNOSIS — K219 Gastro-esophageal reflux disease without esophagitis: Secondary | ICD-10-CM | POA: Diagnosis not present

## 2020-03-21 DIAGNOSIS — N184 Chronic kidney disease, stage 4 (severe): Secondary | ICD-10-CM | POA: Diagnosis not present

## 2020-03-21 DIAGNOSIS — F411 Generalized anxiety disorder: Secondary | ICD-10-CM | POA: Diagnosis not present

## 2020-03-21 DIAGNOSIS — F331 Major depressive disorder, recurrent, moderate: Secondary | ICD-10-CM | POA: Diagnosis not present

## 2020-03-21 DIAGNOSIS — I129 Hypertensive chronic kidney disease with stage 1 through stage 4 chronic kidney disease, or unspecified chronic kidney disease: Secondary | ICD-10-CM | POA: Diagnosis not present

## 2020-03-21 DIAGNOSIS — R32 Unspecified urinary incontinence: Secondary | ICD-10-CM | POA: Diagnosis not present

## 2020-03-21 DIAGNOSIS — Z950 Presence of cardiac pacemaker: Secondary | ICD-10-CM | POA: Diagnosis not present

## 2020-03-21 DIAGNOSIS — I2584 Coronary atherosclerosis due to calcified coronary lesion: Secondary | ICD-10-CM | POA: Diagnosis not present

## 2020-03-21 DIAGNOSIS — R945 Abnormal results of liver function studies: Secondary | ICD-10-CM | POA: Diagnosis not present

## 2020-03-21 DIAGNOSIS — E782 Mixed hyperlipidemia: Secondary | ICD-10-CM | POA: Diagnosis not present

## 2020-03-21 DIAGNOSIS — D72829 Elevated white blood cell count, unspecified: Secondary | ICD-10-CM | POA: Diagnosis not present

## 2020-03-21 DIAGNOSIS — E1122 Type 2 diabetes mellitus with diabetic chronic kidney disease: Secondary | ICD-10-CM | POA: Diagnosis not present

## 2020-03-22 ENCOUNTER — Other Ambulatory Visit (HOSPITAL_COMMUNITY): Payer: Self-pay | Admitting: Internal Medicine

## 2020-03-22 ENCOUNTER — Other Ambulatory Visit: Payer: Self-pay | Admitting: Internal Medicine

## 2020-03-22 DIAGNOSIS — R4781 Slurred speech: Secondary | ICD-10-CM

## 2020-03-22 DIAGNOSIS — I2510 Atherosclerotic heart disease of native coronary artery without angina pectoris: Secondary | ICD-10-CM

## 2020-03-29 ENCOUNTER — Ambulatory Visit (HOSPITAL_COMMUNITY): Payer: Medicare Other

## 2020-04-03 ENCOUNTER — Other Ambulatory Visit: Payer: Self-pay | Admitting: Cardiovascular Disease

## 2020-04-13 ENCOUNTER — Ambulatory Visit (INDEPENDENT_AMBULATORY_CARE_PROVIDER_SITE_OTHER): Payer: Medicare Other | Admitting: Podiatry

## 2020-04-13 ENCOUNTER — Other Ambulatory Visit: Payer: Self-pay

## 2020-04-13 ENCOUNTER — Encounter: Payer: Self-pay | Admitting: Podiatry

## 2020-04-13 ENCOUNTER — Ambulatory Visit (INDEPENDENT_AMBULATORY_CARE_PROVIDER_SITE_OTHER): Payer: Medicare Other | Admitting: *Deleted

## 2020-04-13 VITALS — BP 96/53 | HR 64 | Temp 97.7°F | Resp 16

## 2020-04-13 DIAGNOSIS — I422 Other hypertrophic cardiomyopathy: Secondary | ICD-10-CM | POA: Insufficient documentation

## 2020-04-13 DIAGNOSIS — M1711 Unilateral primary osteoarthritis, right knee: Secondary | ICD-10-CM | POA: Insufficient documentation

## 2020-04-13 DIAGNOSIS — S90112A Contusion of left great toe without damage to nail, initial encounter: Secondary | ICD-10-CM

## 2020-04-13 DIAGNOSIS — M79674 Pain in right toe(s): Secondary | ICD-10-CM | POA: Diagnosis not present

## 2020-04-13 DIAGNOSIS — I2510 Atherosclerotic heart disease of native coronary artery without angina pectoris: Secondary | ICD-10-CM

## 2020-04-13 DIAGNOSIS — B351 Tinea unguium: Secondary | ICD-10-CM

## 2020-04-13 DIAGNOSIS — E6609 Other obesity due to excess calories: Secondary | ICD-10-CM | POA: Insufficient documentation

## 2020-04-13 DIAGNOSIS — M79675 Pain in left toe(s): Secondary | ICD-10-CM

## 2020-04-13 DIAGNOSIS — I495 Sick sinus syndrome: Secondary | ICD-10-CM | POA: Diagnosis not present

## 2020-04-13 DIAGNOSIS — K529 Noninfective gastroenteritis and colitis, unspecified: Secondary | ICD-10-CM | POA: Insufficient documentation

## 2020-04-13 DIAGNOSIS — R001 Bradycardia, unspecified: Secondary | ICD-10-CM | POA: Insufficient documentation

## 2020-04-13 DIAGNOSIS — M1990 Unspecified osteoarthritis, unspecified site: Secondary | ICD-10-CM | POA: Insufficient documentation

## 2020-04-13 LAB — CUP PACEART REMOTE DEVICE CHECK
Battery Remaining Longevity: 126 mo
Battery Remaining Percentage: 95.5 %
Battery Voltage: 2.99 V
Brady Statistic AP VP Percent: 53 %
Brady Statistic AP VS Percent: 44 %
Brady Statistic AS VP Percent: 1 %
Brady Statistic AS VS Percent: 2.3 %
Brady Statistic RA Percent Paced: 97 %
Brady Statistic RV Percent Paced: 53 %
Date Time Interrogation Session: 20210506020021
Implantable Lead Implant Date: 20110113
Implantable Lead Implant Date: 20110113
Implantable Lead Location: 753859
Implantable Lead Location: 753860
Implantable Pulse Generator Implant Date: 20191008
Lead Channel Impedance Value: 490 Ohm
Lead Channel Impedance Value: 650 Ohm
Lead Channel Pacing Threshold Amplitude: 0.5 V
Lead Channel Pacing Threshold Amplitude: 1.25 V
Lead Channel Pacing Threshold Pulse Width: 0.4 ms
Lead Channel Pacing Threshold Pulse Width: 0.4 ms
Lead Channel Sensing Intrinsic Amplitude: 12 mV
Lead Channel Sensing Intrinsic Amplitude: 4.7 mV
Lead Channel Setting Pacing Amplitude: 1.5 V
Lead Channel Setting Pacing Amplitude: 1.5 V
Lead Channel Setting Pacing Pulse Width: 0.4 ms
Lead Channel Setting Sensing Sensitivity: 2 mV
Pulse Gen Model: 2272
Pulse Gen Serial Number: 9064940

## 2020-04-13 NOTE — Progress Notes (Signed)
Remote pacemaker transmission.   

## 2020-04-13 NOTE — Patient Instructions (Signed)
Diabetes Mellitus and Foot Care Foot care is an important part of your health, especially when you have diabetes. Diabetes may cause you to have problems because of poor blood flow (circulation) to your feet and legs, which can cause your skin to:  Become thinner and drier.  Break more easily.  Heal more slowly.  Peel and crack. You may also have nerve damage (neuropathy) in your legs and feet, causing decreased feeling in them. This means that you may not notice minor injuries to your feet that could lead to more serious problems. Noticing and addressing any potential problems early is the best way to prevent future foot problems. How to care for your feet Foot hygiene  Wash your feet daily with warm water and mild soap. Do not use hot water. Then, pat your feet and the areas between your toes until they are completely dry. Do not soak your feet as this can dry your skin.  Trim your toenails straight across. Do not dig under them or around the cuticle. File the edges of your nails with an emery board or nail file.  Apply a moisturizing lotion or petroleum jelly to the skin on your feet and to dry, brittle toenails. Use lotion that does not contain alcohol and is unscented. Do not apply lotion between your toes. Shoes and socks  Wear clean socks or stockings every day. Make sure they are not too tight. Do not wear knee-high stockings since they may decrease blood flow to your legs.  Wear shoes that fit properly and have enough cushioning. Always look in your shoes before you put them on to be sure there are no objects inside.  To break in new shoes, wear them for just a few hours a day. This prevents injuries on your feet. Wounds, scrapes, corns, and calluses  Check your feet daily for blisters, cuts, bruises, sores, and redness. If you cannot see the bottom of your feet, use a mirror or ask someone for help.  Do not cut corns or calluses or try to remove them with medicine.  If you  find a minor scrape, cut, or break in the skin on your feet, keep it and the skin around it clean and dry. You may clean these areas with mild soap and water. Do not clean the area with peroxide, alcohol, or iodine.  If you have a wound, scrape, corn, or callus on your foot, look at it several times a day to make sure it is healing and not infected. Check for: ? Redness, swelling, or pain. ? Fluid or blood. ? Warmth. ? Pus or a bad smell. General instructions  Do not cross your legs. This may decrease blood flow to your feet.  Do not use heating pads or hot water bottles on your feet. They may burn your skin. If you have lost feeling in your feet or legs, you may not know this is happening until it is too late.  Protect your feet from hot and cold by wearing shoes, such as at the beach or on hot pavement.  Schedule a complete foot exam at least once a year (annually) or more often if you have foot problems. If you have foot problems, report any cuts, sores, or bruises to your health care provider immediately. Contact a health care provider if:  You have a medical condition that increases your risk of infection and you have any cuts, sores, or bruises on your feet.  You have an injury that is not   healing.  You have redness on your legs or feet.  You feel burning or tingling in your legs or feet.  You have pain or cramps in your legs and feet.  Your legs or feet are numb.  Your feet always feel cold.  You have pain around a toenail. Get help right away if:  You have a wound, scrape, corn, or callus on your foot and: ? You have pain, swelling, or redness that gets worse. ? You have fluid or blood coming from the wound, scrape, corn, or callus. ? Your wound, scrape, corn, or callus feels warm to the touch. ? You have pus or a bad smell coming from the wound, scrape, corn, or callus. ? You have a fever. ? You have a red line going up your leg. Summary  Check your feet every day  for cuts, sores, red spots, swelling, and blisters.  Moisturize feet and legs daily.  Wear shoes that fit properly and have enough cushioning.  If you have foot problems, report any cuts, sores, or bruises to your health care provider immediately.  Schedule a complete foot exam at least once a year (annually) or more often if you have foot problems. This information is not intended to replace advice given to you by your health care provider. Make sure you discuss any questions you have with your health care provider. Document Revised: 08/18/2019 Document Reviewed: 12/27/2016 Elsevier Patient Education  2020 Elsevier Inc.  

## 2020-04-13 NOTE — Progress Notes (Signed)
   Subjective:    Patient ID: Melissa Lopez, female    DOB: [birth year], 69 y.o.   MRN: [id number]  HPI    Review of Systems  All other systems reviewed and are negative.      Objective:   Physical Exam        Assessment & Plan:

## 2020-04-16 NOTE — Progress Notes (Signed)
Subjective:   Patient ID: Melissa Lopez, female   DOB: 69 y.o.   MRN: [id number]   HPI Patient presents with caregiver with long-term diabetes under fair control with thick yellow brittle nailbeds and incurvation of the corners that are sore when pressed.  Patient states she also feels like she gets some discoloration she was concerned about and wanted that checked also.  Patient does not smoke and is not active currently    Review of Systems  All other systems reviewed and are negative.       Objective:  Physical Exam Vitals and nursing note reviewed.  Constitutional:      Appearance: She is well-developed.  Pulmonary:     Effort: Pulmonary effort is normal.  Musculoskeletal:        General: Normal range of motion.  Skin:    General: Skin is warm.  Neurological:     Mental Status: She is alert.     Neurovascular status found to be mildly diminished but intact bilateral with patient found to have mild dryness of her skin's and is found to have significant thickness of her nailbeds 1 through 5 both feet with incurvation of the beds irritation of the corners but no active drainage or redness noted.  Has mild loss of sharp dull vibratory bilateral and was found to have good digital perfusion well oriented x3     Assessment:  Thick yellow brittle nailbeds 1-5 both feet with moderate discomfort associated with them along with mild dermatitis condition localized and mild neuropathic diabetic neuropathy     Plan:  H&P reviewed diabetic foot care and daily inspections.  At this point debridement of all nailbeds accomplished with no iatrogenic bleeding noted and reviewed the possibility for other treatments depending on the results with this but I would recommend routine care at this point.  Patient will be seen back as needed

## 2020-04-17 ENCOUNTER — Ambulatory Visit (HOSPITAL_COMMUNITY)
Admission: RE | Admit: 2020-04-17 | Discharge: 2020-04-17 | Disposition: A | Discharge status: Home / Self Care | Payer: Medicare Other | Source: Ambulatory Visit | Attending: Internal Medicine | Admitting: Internal Medicine

## 2020-04-17 ENCOUNTER — Other Ambulatory Visit: Payer: Self-pay

## 2020-04-17 DIAGNOSIS — I2510 Atherosclerotic heart disease of native coronary artery without angina pectoris: Secondary | ICD-10-CM

## 2020-04-17 DIAGNOSIS — I6523 Occlusion and stenosis of bilateral carotid arteries: Secondary | ICD-10-CM | POA: Diagnosis not present

## 2020-04-17 DIAGNOSIS — R29810 Facial weakness: Secondary | ICD-10-CM | POA: Diagnosis not present

## 2020-04-17 DIAGNOSIS — R4781 Slurred speech: Secondary | ICD-10-CM | POA: Diagnosis not present

## 2020-04-24 ENCOUNTER — Other Ambulatory Visit: Payer: Self-pay | Admitting: Cardiovascular Disease

## 2020-04-25 ENCOUNTER — Other Ambulatory Visit: Payer: Self-pay | Admitting: Cardiovascular Disease

## 2020-05-10 DIAGNOSIS — L02211 Cutaneous abscess of abdominal wall: Secondary | ICD-10-CM | POA: Diagnosis not present

## 2020-05-20 DIAGNOSIS — L02211 Cutaneous abscess of abdominal wall: Secondary | ICD-10-CM | POA: Diagnosis not present

## 2020-05-25 ENCOUNTER — Other Ambulatory Visit: Payer: Self-pay | Admitting: Cardiovascular Disease

## 2020-05-26 ENCOUNTER — Other Ambulatory Visit: Payer: Self-pay | Admitting: Cardiovascular Disease

## 2020-05-26 NOTE — Telephone Encounter (Signed)
[facility] would like to verify Pt medications   Please call(anyone can assist you) [phone]   Thanks renee

## 2020-05-26 NOTE — Telephone Encounter (Signed)
Medication list faxed to pharmacy.  Fax:  [phone].

## 2020-06-07 ENCOUNTER — Other Ambulatory Visit: Payer: Self-pay

## 2020-06-07 MED ORDER — HYDRALAZINE HCL 50 MG PO TABS: 50.0000 mg | ORAL_TABLET | Freq: Three times a day (TID) | ORAL | 0 refills | Status: DC

## 2020-07-13 ENCOUNTER — Ambulatory Visit (INDEPENDENT_AMBULATORY_CARE_PROVIDER_SITE_OTHER): Payer: Medicare Other | Admitting: *Deleted

## 2020-07-13 DIAGNOSIS — I422 Other hypertrophic cardiomyopathy: Secondary | ICD-10-CM | POA: Diagnosis not present

## 2020-07-13 LAB — CUP PACEART REMOTE DEVICE CHECK
Battery Remaining Longevity: 125 mo
Battery Remaining Percentage: 95.5 %
Battery Voltage: 3.01 V
Brady Statistic AP VP Percent: 56 %
Brady Statistic AP VS Percent: 41 %
Brady Statistic AS VP Percent: 1 %
Brady Statistic AS VS Percent: 2.1 %
Brady Statistic RA Percent Paced: 98 %
Brady Statistic RV Percent Paced: 56 %
Date Time Interrogation Session: 20210805020013
Implantable Lead Implant Date: 20110113
Implantable Lead Implant Date: 20110113
Implantable Lead Location: 753859
Implantable Lead Location: 753860
Implantable Pulse Generator Implant Date: 20191008
Lead Channel Impedance Value: 450 Ohm
Lead Channel Impedance Value: 650 Ohm
Lead Channel Pacing Threshold Amplitude: 0.625 V
Lead Channel Pacing Threshold Amplitude: 1.25 V
Lead Channel Pacing Threshold Pulse Width: 0.4 ms
Lead Channel Pacing Threshold Pulse Width: 0.4 ms
Lead Channel Sensing Intrinsic Amplitude: 12 mV
Lead Channel Sensing Intrinsic Amplitude: 5 mV
Lead Channel Setting Pacing Amplitude: 1.5 V
Lead Channel Setting Pacing Amplitude: 1.625
Lead Channel Setting Pacing Pulse Width: 0.4 ms
Lead Channel Setting Sensing Sensitivity: 2 mV
Pulse Gen Model: 2272
Pulse Gen Serial Number: 9064940

## 2020-07-14 NOTE — Progress Notes (Signed)
Remote pacemaker transmission.   

## 2020-07-19 ENCOUNTER — Ambulatory Visit: Payer: Medicare Other | Admitting: Podiatry

## 2020-08-09 ENCOUNTER — Telehealth: Payer: Self-pay | Admitting: Cardiovascular Disease

## 2020-08-09 MED ORDER — CARVEDILOL 12.5 MG PO TABS: ORAL_TABLET | ORAL | 0 refills | Status: DC

## 2020-08-09 MED ORDER — AMLODIPINE BESYLATE 10 MG PO TABS: ORAL_TABLET | ORAL | 0 refills | Status: DC

## 2020-08-09 MED ORDER — ROSUVASTATIN CALCIUM 5 MG PO TABS
ORAL_TABLET | ORAL | 0 refills | Status: DC
Start: 2020-08-09 — End: 2020-08-31

## 2020-08-09 NOTE — Telephone Encounter (Signed)
New message      *STAT* If patient is at the pharmacy, call can be transferred to refill team.   1. Which medications need to be refilled? (please list name of each medication and dose if known) amLODipine (NORVASC) 10 MG tablet carvedilol (COREG) 12.5 MG tablet rosuvastatin (CRESTOR) 5 MG tablet 2. Which pharmacy/location (including street and city if local pharmacy) is medication to be sent to? [facility]   3. Do they need a 30 day or 90 day supply? [facility]

## 2020-08-11 ENCOUNTER — Encounter: Payer: Self-pay | Admitting: Family Medicine

## 2020-08-11 ENCOUNTER — Ambulatory Visit (INDEPENDENT_AMBULATORY_CARE_PROVIDER_SITE_OTHER): Payer: Medicare Other | Admitting: Family Medicine

## 2020-08-11 ENCOUNTER — Other Ambulatory Visit: Payer: Self-pay

## 2020-08-11 VITALS — BP 100/60 | HR 59 | Ht 64.0 in | Wt 167.0 lb

## 2020-08-11 DIAGNOSIS — I2510 Atherosclerotic heart disease of native coronary artery without angina pectoris: Secondary | ICD-10-CM | POA: Diagnosis not present

## 2020-08-11 DIAGNOSIS — N1832 Chronic kidney disease, stage 3b: Secondary | ICD-10-CM | POA: Diagnosis not present

## 2020-08-11 DIAGNOSIS — Z950 Presence of cardiac pacemaker: Secondary | ICD-10-CM | POA: Diagnosis not present

## 2020-08-11 DIAGNOSIS — R0602 Shortness of breath: Secondary | ICD-10-CM

## 2020-08-11 DIAGNOSIS — I10 Essential (primary) hypertension: Secondary | ICD-10-CM

## 2020-08-11 DIAGNOSIS — Z87898 Personal history of other specified conditions: Secondary | ICD-10-CM

## 2020-08-11 MED ORDER — CARVEDILOL 6.25 MG PO TABS: 6.2500 mg | ORAL_TABLET | Freq: Two times a day (BID) | ORAL | 1 refills | Status: DC

## 2020-08-11 MED ORDER — LISINOPRIL 20 MG PO TABS: 20.0000 mg | ORAL_TABLET | Freq: Every day | ORAL | 1 refills | Status: DC

## 2020-08-11 NOTE — Progress Notes (Signed)
Cardiology Office Note  Date: 08/11/2020   ID: Melissa Lopez, DOB [birth year], MRN [id number]  PCP:  Celene Squibb, MD  Cardiologist:  No primary care provider on file. Electrophysiologist:  None   Chief Complaint: CAD  History of Present Illness: Melissa Lopez is a 69 y.o. 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  She was hospitalized in November/December 2019 for chest pain, troponin elevation is syncope.  GI evaluated her and high suspicion for gastroparesis and marijuana use as a source of nausea and vomiting.  She was having chronic fatigue.  Last seen by Dr. Bronson Ing 03/19/2019 via telemedicine.  Dr. Bronson Ing reviewed her previous catheterization with recommendations for medical therapy.  She was continuing her aspirin, statin, beta-blocker.  He noted her hypertension would need continued monitoring.  During that visit serum creatinine was noted to be 1.9 on 02/11/2019.  She had been referred to nephrology by PCP.  History of syncope.  No arrhythmias while hospitalized.  He enrolled her in pacemaker device clinic. She recently had a generator change in October 2019 in [place].   She presents today for past due follow-up.  Her daughter accompanies her.  Her daughter states she has been having frequent falls and complaining of dizziness recently.  Daughter states she is noticed increased increased dyspnea on exertion and exertional fatigue.  She states her mother feels weak when she walks.  Mother has had bilateral knee replacements in the past.  Patient states she feels weak and knees feel rubbery after walking short distances.  Daughter states since around early 2019 patient weighed 210 pounds.  States today she weighed 167 pounds.  She has diabetic nephropathy and is seeing nephrology.  Patient denies any anginal symptoms, palpitations or arrhythmias.  She has a pacemaker in place due to  symptomatic bradycardia.  Initial [employer] Jude pacemaker insertion 2008.  Recent remote device check showed normal functioning pacemaker.  Her blood pressure today on arrival is 100/60.  Heart rate is 59.  Patient states her primary care provider displaced her on Trulicity for diabetes.   Past Medical History:  Diagnosis Date  . CKD (chronic kidney disease) stage 3, GFR 30-59 ml/min 10/31/2018   - per patient report  . Coronary artery disease    per patient report - 2 caths with non-obstructive CAD  . Essential hypertension   . H/O cardiac pacemaker 10/31/2018  . Sick sinus syndrome ([place])   . Type 2 diabetes mellitus with complication, with long-term current use of insulin ([place]) 10/31/2018   with diabetic nephropathy    Past Surgical History:  Procedure Laterality Date  . CARDIAC PACEMAKER PLACEMENT  2008   in [place] for sick sinus syndrome  . ESOPHAGOGASTRODUODENOSCOPY (EGD) WITH PROPOFOL N/A 11/10/2018   Procedure: ESOPHAGOGASTRODUODENOSCOPY (EGD) WITH PROPOFOL;  Surgeon: Wilford Corner, MD;  Location: [facility];  Service: Endoscopy;  Laterality: N/A;  . LEFT HEART CATH AND CORONARY ANGIOGRAPHY N/A 10/31/2018   Procedure: LEFT HEART CATH AND CORONARY ANGIOGRAPHY;  Surgeon: Leonie Man, MD;  Location: [facility];  Service: Cardiovascular;  Laterality: N/A;  . PACEMAKER GENERATOR CHANGE  09/2018   in [place]    Current Outpatient Medications  Medication Sig Dispense Refill  . amLODipine (NORVASC) 10 MG tablet TAKE 1 TABLET BY MOUTH ONCE DAILY *PT NEEDS APPOINTMENT* 3 tablet 0  . aspirin 81 MG chewable tablet Chew 1 tablet (81  mg total) by mouth daily. 30 tablet 0  . buPROPion (WELLBUTRIN SR) 150 MG 12 hr tablet Take 150 mg by mouth daily.    . Dulaglutide (TRULICITY [place]) Inject into the skin.    Marland Kitchen escitalopram (LEXAPRO) 20 MG tablet Take 20 mg by mouth daily.    Marland Kitchen gabapentin (NEURONTIN) 100 MG capsule Take 100 mg by mouth 3 (three) times daily.      . hydrALAZINE (APRESOLINE) 50 MG tablet Take 50 mg by mouth in the morning and at bedtime.    Marland Kitchen LORazepam (ATIVAN) 1 MG tablet Take 1 mg by mouth daily as needed for anxiety.    Marland Kitchen omeprazole (PRILOSEC) 20 MG capsule Take 20 mg by mouth daily.    Marland Kitchen oxybutynin (DITROPAN-XL) 10 MG 24 hr tablet Take 10 mg by mouth at bedtime.    . rosuvastatin (CRESTOR) 5 MG tablet TAKE 1 TABLET BY MOUTH DAILY AT 6PM *PATIENT NEEDS APPOINTMENT* 3 tablet 0  . simethicone (MYLICON) 024 MG chewable tablet Chew 125-250 mg by mouth every 6 (six) hours as needed for flatulence.    Marland Kitchen tiZANidine (ZANAFLEX) 2 MG tablet Take 2 mg by mouth every 6 (six) hours as needed for muscle pain.    . traZODone (DESYREL) 100 MG tablet Take 100 mg by mouth at bedtime.    . carvedilol (COREG) 6.25 MG tablet Take 1 tablet (6.25 mg total) by mouth 2 (two) times daily. 180 tablet 1  . lisinopril (ZESTRIL) 20 MG tablet Take 1 tablet (20 mg total) by mouth daily. 90 tablet 1  . metoCLOPramide (REGLAN) 5 MG tablet Take 1 tablet (5 mg total) by mouth every 6 (six) hours. 120 tablet 0   No current facility-administered medications for this visit.   Allergies:  Oxycodone-acetaminophen, Contrast media [iodinated diagnostic agents], No known allergies, Oxycodone, and Xarelto  [rivaroxaban]   Social History: The patient  reports that she quit smoking about 50 years ago. She quit after 3.00 years of use. She has never used smokeless tobacco. She reports previous alcohol use. She reports that she does not use drugs.   Family History: The patient's Family history is unknown by patient.   ROS:  Please see the history of present illness. Otherwise, complete review of systems is positive for none.  All other systems are reviewed and negative.   Physical Exam: VS:  BP 100/60   Pulse (!) 59   Ht 5\' 4"  (1.626 m)   Wt 167 lb (75.8 kg)   SpO2 95%   BMI 28.67 kg/m , BMI Body mass index is 28.67 kg/m.  Wt Readings from Last 3 Encounters:  08/11/20  167 lb (75.8 kg)  03/19/19 179 lb (81.2 kg)  11/10/18 182 lb 4.8 oz (82.7 kg)    General: Patient appears comfortable at rest. Neck: Supple, no elevated JVP or carotid bruits, no thyromegaly. Lungs: Clear to auscultation, nonlabored breathing at rest. Cardiac: Regular rate and atrial paced rhythm, no S3 or significant systolic murmur, no pericardial rub. Extremities: No pitting edema, distal pulses 2+. Skin: Warm and dry. Musculoskeletal: No kyphosis. Neuropsychiatric: Alert and oriented x3, affect grossly appropriate.  ECG:  An ECG dated 08/11/2020 was personally reviewed today and demonstrated:  Electronic atrial pacemaker rate of 59  Recent Labwork: No results found for requested labs within last 8760 hours.     Component Value Date/Time   CHOL 153 11/01/2018 0516   TRIG 175 (H) 11/01/2018 0516   HDL 24 (L) 11/01/2018 0516   CHOLHDL 6.4  11/01/2018 0516   VLDL 35 11/01/2018 0516   LDLCALC 94 11/01/2018 0516    Other Studies Reviewed Today:  Cardiac catheterization 09/32/3557:  LV end diastolic pressure is mildly elevated.  Ost Ramus lesion is 70% stenosed -relatively small caliber vessel. Not likely because of resting chest pain and ST elevations.  Ost 1st Diag lesion is 60% stenosed. -Not likely flow-limiting.  Very small caliber (<40mm) 1st Mrg lesion appears to be 100% stenosed with faint collateralization. Too small for angioplasty or stenting.  SUMMARY:  Angiographically mild diffuse disease, only potentially occluded vessels are very very small branch of the major obtuse marginal. Not likely a culprit lesion for significant anginal chest pain.  Mild to moderately elevated LVEDP. LV gram not performed due to renal insufficiency.  Echocardiogram 10/31/2018: Study Conclusions  - Left ventricle: The cavity size was normal. Wall thickness was increased in a pattern of severe LVH. Systolic function was normal. The estimated ejection fraction was in the range  of 55% to 60%. There is hypokinesis of the apical myocardium. Doppler parameters are consistent with abnormal left ventricular relaxation (grade 1 diastolic dysfunction). - Pericardium, extracardiac: A trivial pericardial effusion was identified.  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  Diagnostic Dominance: Right      Assessment and Plan:  1. CAD in native artery   2. Essential hypertension   3. History of syncope   4. Cardiac pacemaker in situ   5. Stage 3b chronic kidney disease   6. SOB (shortness of breath)    1. CAD in native artery Cardiac catheterization 2019 showed angiographically mild diffuse disease, only potential occluded vessels are very small branch of the major obtuse marginal.  No progressive anginal symptoms.  Continue aspirin 81 mg daily.  Crestor 5 mg p.o. daily  2. Essential hypertension Blood pressure is low today.  Previous blood pressure in May was also low.  Decrease carvedilol to 6.25 mg p.o. twice daily.  Decrease lisinopril to 20 mg daily.  Decrease hydralazine to 50 mg p.o. twice daily.  Continue amlodipine 10 mg  3. History of syncope Previous history of syncopal episodes.  Recent near syncopal episode/possible orthostatic hypotension secondary to antihypertensive medications (i.e. ACE inhibitors and hydralazine) and beta-blocker.  Patient states she has been feeling significantly dizzy when she goes from a lying to sitting to standing positions.  She has a history of symptomatic bradycardia with pacemaker in place.  4. Cardiac pacemaker in [place] pacemaker in place secondary to symptomatic bradycardia.  Initial placement of pacemaker 2008.  Recent generator change October 2019 in [place].  Recent remote device check was  functioning normally.  5. Stage 3b chronic kidney disease Sees nephrology for diabetes induced nephropathy.  6.  Increase dyspnea on exertion/shortness of breath Daughter states patient is having increasing dyspnea on exertion/shortness of breath when doing more than normal activities of daily living.Last echo performed November 2019 patient has severe LVH.  EF 35 to 60%.  Hypokinesis of apical myocardium.  G1 DD.  Severe LVH is more prominent at apex, consider apical variant hypertrophic cardiomyopathy or infiltrative cardiomyopathy.  Please repeat echocardiogram  Medication Adjustments/Labs and Tests Ordered: Current medicines are reviewed at length with the patient today.  Concerns regarding medicines are outlined above.   Disposition: Follow-up with   Signed, Levell July, NP 08/11/2020 2:48 PM  [facility] [place], [place], [place] [address] Phone: [phone]; Fax: [phone]

## 2020-08-11 NOTE — Patient Instructions (Signed)
Your physician recommends that you schedule a follow-up appointment in: [place], NP  Your physician has recommended you make the following change in your medication:   DECREASE LISINOPRIL 20 MG DAILY   DECREASE CARVEDILOL 6.25 MG TWICE DAILY   TAKE HYDRALAZINE 54 MG TWICE DAILY   Your physician has requested that you have an echocardiogram. Echocardiography is a painless test that uses sound waves to create images of your heart. It provides your doctor with information about the size and shape of your heart and how well your heart's chambers and valves are working. This procedure takes approximately one hour. There are no restrictions for this procedure.  Thank you for choosing [facility]!!

## 2020-08-15 NOTE — Addendum Note (Signed)
Addended by: Julian Hy T on: 08/15/2020 01:57 PM   Modules accepted: Orders

## 2020-08-24 ENCOUNTER — Other Ambulatory Visit: Payer: Medicare Other

## 2020-08-31 ENCOUNTER — Other Ambulatory Visit: Payer: Self-pay | Admitting: Family Medicine

## 2020-09-08 ENCOUNTER — Ambulatory Visit: Payer: Medicare Other | Admitting: Family Medicine

## 2020-09-14 ENCOUNTER — Telehealth: Payer: Self-pay | Admitting: *Deleted

## 2020-09-14 ENCOUNTER — Ambulatory Visit (INDEPENDENT_AMBULATORY_CARE_PROVIDER_SITE_OTHER): Payer: Medicare Other

## 2020-09-14 DIAGNOSIS — R0602 Shortness of breath: Secondary | ICD-10-CM | POA: Diagnosis not present

## 2020-09-14 LAB — ECHOCARDIOGRAM COMPLETE
AR max vel: 2.14 cm2
AV Area VTI: 2.88 cm2
AV Area mean vel: 2.95 cm2
AV Mean grad: 2.8 mmHg
AV Peak grad: 10.1 mmHg
Ao pk vel: 1.59 m/s
Area-P 1/2: 3.34 cm2
Calc EF: 70.5 %
MV M vel: 1.86 m/s
MV Peak grad: 13.8 mmHg
S' Lateral: 2.96 cm
Single Plane A2C EF: 70.1 %
Single Plane A4C EF: 72.5 %

## 2020-09-14 NOTE — Telephone Encounter (Signed)
-----   Message from Verta Ellen., NP sent at 09/14/2020  5:09 PM EDT ----- These call the patient and let her know the echocardiogram showed good pumping function of her heart.  The main pumping chamber is a little muscular and stiff.  The best management for this is managing her blood pressure to keep it below 130/80 and managing all of her other risk factors.  No major change from previous echocardiogram in November 2019.

## 2020-09-14 NOTE — Telephone Encounter (Signed)
Laurine Blazer, LPN  97/07/207 9:10 PM EDT Back to Top    Daughter [name]) notified. Copy to pcp.

## 2020-09-18 ENCOUNTER — Ambulatory Visit: Payer: Medicare Other | Admitting: Family Medicine

## 2020-09-22 ENCOUNTER — Inpatient Hospital Stay (HOSPITAL_COMMUNITY)
Admission: EM | Admit: 2020-09-22 | Discharge: 2020-09-29 | DRG: 644 | Disposition: A | Discharge status: Home Health | Payer: Medicare Other | Attending: Internal Medicine | Admitting: Internal Medicine

## 2020-09-22 ENCOUNTER — Encounter (HOSPITAL_COMMUNITY): Payer: Self-pay

## 2020-09-22 ENCOUNTER — Emergency Department (HOSPITAL_COMMUNITY): Payer: Medicare Other

## 2020-09-22 DIAGNOSIS — I495 Sick sinus syndrome: Secondary | ICD-10-CM | POA: Diagnosis present

## 2020-09-22 DIAGNOSIS — R1110 Vomiting, unspecified: Secondary | ICD-10-CM

## 2020-09-22 DIAGNOSIS — Z950 Presence of cardiac pacemaker: Secondary | ICD-10-CM | POA: Diagnosis not present

## 2020-09-22 DIAGNOSIS — Z87891 Personal history of nicotine dependence: Secondary | ICD-10-CM

## 2020-09-22 DIAGNOSIS — N19 Unspecified kidney failure: Secondary | ICD-10-CM | POA: Diagnosis not present

## 2020-09-22 DIAGNOSIS — Z794 Long term (current) use of insulin: Secondary | ICD-10-CM | POA: Diagnosis not present

## 2020-09-22 DIAGNOSIS — K2960 Other gastritis without bleeding: Secondary | ICD-10-CM | POA: Diagnosis present

## 2020-09-22 DIAGNOSIS — E1143 Type 2 diabetes mellitus with diabetic autonomic (poly)neuropathy: Secondary | ICD-10-CM | POA: Diagnosis present

## 2020-09-22 DIAGNOSIS — K529 Noninfective gastroenteritis and colitis, unspecified: Secondary | ICD-10-CM | POA: Diagnosis not present

## 2020-09-22 DIAGNOSIS — E1140 Type 2 diabetes mellitus with diabetic neuropathy, unspecified: Secondary | ICD-10-CM | POA: Diagnosis present

## 2020-09-22 DIAGNOSIS — F419 Anxiety disorder, unspecified: Secondary | ICD-10-CM | POA: Diagnosis present

## 2020-09-22 DIAGNOSIS — E0821 Diabetes mellitus due to underlying condition with diabetic nephropathy: Secondary | ICD-10-CM

## 2020-09-22 DIAGNOSIS — R1111 Vomiting without nausea: Secondary | ICD-10-CM | POA: Diagnosis not present

## 2020-09-22 DIAGNOSIS — E860 Dehydration: Secondary | ICD-10-CM | POA: Diagnosis not present

## 2020-09-22 DIAGNOSIS — Z888 Allergy status to other drugs, medicaments and biological substances status: Secondary | ICD-10-CM

## 2020-09-22 DIAGNOSIS — I5032 Chronic diastolic (congestive) heart failure: Secondary | ICD-10-CM | POA: Diagnosis present

## 2020-09-22 DIAGNOSIS — E1122 Type 2 diabetes mellitus with diabetic chronic kidney disease: Secondary | ICD-10-CM | POA: Diagnosis present

## 2020-09-22 DIAGNOSIS — I10 Essential (primary) hypertension: Secondary | ICD-10-CM | POA: Diagnosis present

## 2020-09-22 DIAGNOSIS — Z91041 Radiographic dye allergy status: Secondary | ICD-10-CM

## 2020-09-22 DIAGNOSIS — E213 Hyperparathyroidism, unspecified: Secondary | ICD-10-CM

## 2020-09-22 DIAGNOSIS — E210 Primary hyperparathyroidism: Secondary | ICD-10-CM | POA: Diagnosis not present

## 2020-09-22 DIAGNOSIS — F1290 Cannabis use, unspecified, uncomplicated: Secondary | ICD-10-CM | POA: Diagnosis present

## 2020-09-22 DIAGNOSIS — R5381 Other malaise: Secondary | ICD-10-CM | POA: Diagnosis not present

## 2020-09-22 DIAGNOSIS — Z20822 Contact with and (suspected) exposure to covid-19: Secondary | ICD-10-CM | POA: Diagnosis present

## 2020-09-22 DIAGNOSIS — F32A Depression, unspecified: Secondary | ICD-10-CM | POA: Diagnosis present

## 2020-09-22 DIAGNOSIS — I130 Hypertensive heart and chronic kidney disease with heart failure and stage 1 through stage 4 chronic kidney disease, or unspecified chronic kidney disease: Secondary | ICD-10-CM | POA: Diagnosis present

## 2020-09-22 DIAGNOSIS — N1832 Chronic kidney disease, stage 3b: Secondary | ICD-10-CM | POA: Diagnosis not present

## 2020-09-22 DIAGNOSIS — K5909 Other constipation: Secondary | ICD-10-CM | POA: Diagnosis present

## 2020-09-22 DIAGNOSIS — R0902 Hypoxemia: Secondary | ICD-10-CM | POA: Diagnosis not present

## 2020-09-22 DIAGNOSIS — E118 Type 2 diabetes mellitus with unspecified complications: Secondary | ICD-10-CM

## 2020-09-22 DIAGNOSIS — E876 Hypokalemia: Secondary | ICD-10-CM | POA: Diagnosis present

## 2020-09-22 DIAGNOSIS — E1165 Type 2 diabetes mellitus with hyperglycemia: Secondary | ICD-10-CM | POA: Diagnosis not present

## 2020-09-22 DIAGNOSIS — I2510 Atherosclerotic heart disease of native coronary artery without angina pectoris: Secondary | ICD-10-CM | POA: Diagnosis present

## 2020-09-22 DIAGNOSIS — R112 Nausea with vomiting, unspecified: Secondary | ICD-10-CM | POA: Diagnosis present

## 2020-09-22 DIAGNOSIS — Z885 Allergy status to narcotic agent status: Secondary | ICD-10-CM

## 2020-09-22 DIAGNOSIS — R1084 Generalized abdominal pain: Secondary | ICD-10-CM

## 2020-09-22 DIAGNOSIS — Z7982 Long term (current) use of aspirin: Secondary | ICD-10-CM

## 2020-09-22 DIAGNOSIS — K5900 Constipation, unspecified: Secondary | ICD-10-CM | POA: Diagnosis not present

## 2020-09-22 DIAGNOSIS — K3184 Gastroparesis: Secondary | ICD-10-CM | POA: Diagnosis present

## 2020-09-22 DIAGNOSIS — N183 Chronic kidney disease, stage 3 unspecified: Secondary | ICD-10-CM | POA: Diagnosis present

## 2020-09-22 DIAGNOSIS — K8689 Other specified diseases of pancreas: Secondary | ICD-10-CM | POA: Diagnosis not present

## 2020-09-22 DIAGNOSIS — Z79899 Other long term (current) drug therapy: Secondary | ICD-10-CM

## 2020-09-22 DIAGNOSIS — R319 Hematuria, unspecified: Secondary | ICD-10-CM | POA: Diagnosis not present

## 2020-09-22 LAB — CBC WITH DIFFERENTIAL/PLATELET
Abs Immature Granulocytes: 0.11 10*3/uL — ABNORMAL HIGH (ref 0.00–0.07)
Basophils Absolute: 0 10*3/uL (ref 0.0–0.1)
Basophils Relative: 0 %
Eosinophils Absolute: 0 10*3/uL (ref 0.0–0.5)
Eosinophils Relative: 0 %
HCT: 47.6 % — ABNORMAL HIGH (ref 36.0–46.0)
Hemoglobin: 17.1 g/dL — ABNORMAL HIGH (ref 12.0–15.0)
Immature Granulocytes: 1 %
Lymphocytes Relative: 6 %
Lymphs Abs: 1.6 10*3/uL (ref 0.7–4.0)
MCH: 31 pg (ref 26.0–34.0)
MCHC: 35.9 g/dL (ref 30.0–36.0)
MCV: 86.2 fL (ref 80.0–100.0)
Monocytes Absolute: 1.4 10*3/uL — ABNORMAL HIGH (ref 0.1–1.0)
Monocytes Relative: 6 %
Neutro Abs: 21.3 10*3/uL — ABNORMAL HIGH (ref 1.7–7.7)
Neutrophils Relative %: 87 %
Platelets: 273 10*3/uL (ref 150–400)
RBC: 5.52 MIL/uL — ABNORMAL HIGH (ref 3.87–5.11)
RDW: 12.1 % (ref 11.5–15.5)
WBC: 24.4 10*3/uL — ABNORMAL HIGH (ref 4.0–10.5)
nRBC: 0 % (ref 0.0–0.2)

## 2020-09-22 LAB — COMPREHENSIVE METABOLIC PANEL
ALT: 16 U/L (ref 0–44)
AST: 17 U/L (ref 15–41)
Albumin: 4.9 g/dL (ref 3.5–5.0)
Alkaline Phosphatase: 122 U/L (ref 38–126)
Anion gap: 13 (ref 5–15)
BUN: 28 mg/dL — ABNORMAL HIGH (ref 8–23)
CO2: 21 mmol/L — ABNORMAL LOW (ref 22–32)
Calcium: 13.5 mg/dL (ref 8.9–10.3)
Chloride: 101 mmol/L (ref 98–111)
Creatinine, Ser: 1.56 mg/dL — ABNORMAL HIGH (ref 0.44–1.00)
GFR, Estimated: 34 mL/min — ABNORMAL LOW (ref >60)
Glucose, Bld: 227 mg/dL — ABNORMAL HIGH (ref 70–99)
Potassium: 3.1 mmol/L — ABNORMAL LOW (ref 3.5–5.1)
Sodium: 135 mmol/L (ref 135–145)
Total Bilirubin: 1 mg/dL (ref 0.3–1.2)
Total Protein: 8.5 g/dL — ABNORMAL HIGH (ref 6.5–8.1)

## 2020-09-22 LAB — TSH: TSH: 2.207 u[IU]/mL (ref 0.350–4.500)

## 2020-09-22 LAB — TROPONIN I (HIGH SENSITIVITY)
Troponin I (High Sensitivity): 32 ng/L — ABNORMAL HIGH (ref <18)
Troponin I (High Sensitivity): 39 ng/L — ABNORMAL HIGH (ref <18)

## 2020-09-22 LAB — RESPIRATORY PANEL BY RT PCR (FLU A&B, COVID)
Influenza A by PCR: NEGATIVE
Influenza B by PCR: NEGATIVE
SARS Coronavirus 2 by RT PCR: NEGATIVE

## 2020-09-22 LAB — MAGNESIUM: Magnesium: 2.2 mg/dL (ref 1.7–2.4)

## 2020-09-22 LAB — LIPASE, BLOOD: Lipase: 27 U/L (ref 11–51)

## 2020-09-22 MED ORDER — PANTOPRAZOLE SODIUM 40 MG IV SOLR
40.0000 mg | INTRAVENOUS | Status: DC
  Administered 2020-09-23 – 2020-09-24 (×2): 40 mg via INTRAVENOUS
  Filled 2020-09-22 (×2): qty 40

## 2020-09-22 MED ORDER — INSULIN ASPART 100 UNIT/ML ~~LOC~~ SOLN: 0.0000 [IU] | Freq: Every day | SUBCUTANEOUS | Status: DC

## 2020-09-22 MED ORDER — LABETALOL HCL 5 MG/ML IV SOLN
10.0000 mg | Freq: Once | INTRAVENOUS | Status: AC
  Administered 2020-09-22: 10 mg via INTRAVENOUS
  Filled 2020-09-22: qty 4

## 2020-09-22 MED ORDER — SODIUM CHLORIDE 0.9 % IV BOLUS
500.0000 mL | Freq: Once | INTRAVENOUS | Status: AC
  Administered 2020-09-22: 500 mL via INTRAVENOUS

## 2020-09-22 MED ORDER — METOCLOPRAMIDE HCL 5 MG/ML IJ SOLN
5.0000 mg | Freq: Four times a day (QID) | INTRAMUSCULAR | Status: DC
  Administered 2020-09-22 – 2020-09-25 (×11): 5 mg via INTRAVENOUS
  Filled 2020-09-22 (×11): qty 2

## 2020-09-22 MED ORDER — FENTANYL CITRATE (PF) 100 MCG/2ML IJ SOLN
50.0000 ug | Freq: Once | INTRAMUSCULAR | Status: AC
  Administered 2020-09-22: 50 ug via INTRAVENOUS
  Filled 2020-09-22: qty 2

## 2020-09-22 MED ORDER — ESCITALOPRAM OXALATE 20 MG PO TABS
20.0000 mg | ORAL_TABLET | Freq: Every day | ORAL | Status: DC
  Administered 2020-09-23 – 2020-09-29 (×7): 20 mg via ORAL
  Filled 2020-09-22 (×7): qty 1

## 2020-09-22 MED ORDER — POTASSIUM CHLORIDE IN NACL 20-0.45 MEQ/L-% IV SOLN
INTRAVENOUS | Status: DC
  Filled 2020-09-22 (×12): qty 1000

## 2020-09-22 MED ORDER — LISINOPRIL 20 MG PO TABS
20.0000 mg | ORAL_TABLET | Freq: Every day | ORAL | Status: DC
  Administered 2020-09-23 – 2020-09-26 (×4): 20 mg via ORAL
  Filled 2020-09-22 (×4): qty 1

## 2020-09-22 MED ORDER — HYDRALAZINE HCL 25 MG PO TABS
25.0000 mg | ORAL_TABLET | Freq: Every day | ORAL | Status: DC
  Administered 2020-09-23 – 2020-09-29 (×7): 25 mg via ORAL
  Filled 2020-09-22 (×7): qty 1

## 2020-09-22 MED ORDER — INSULIN GLARGINE 100 UNIT/ML ~~LOC~~ SOLN
5.0000 [IU] | Freq: Every day | SUBCUTANEOUS | Status: DC
  Administered 2020-09-23 – 2020-09-29 (×7): 5 [IU] via SUBCUTANEOUS
  Filled 2020-09-22 (×12): qty 0.05

## 2020-09-22 MED ORDER — PANTOPRAZOLE SODIUM 40 MG IV SOLR
80.0000 mg | Freq: Once | INTRAVENOUS | Status: AC
  Administered 2020-09-22: 80 mg via INTRAVENOUS
  Filled 2020-09-22: qty 80

## 2020-09-22 MED ORDER — INSULIN ASPART 100 UNIT/ML ~~LOC~~ SOLN
0.0000 [IU] | Freq: Three times a day (TID) | SUBCUTANEOUS | Status: DC
  Administered 2020-09-23: 3 [IU] via SUBCUTANEOUS
  Administered 2020-09-23: 2 [IU] via SUBCUTANEOUS
  Administered 2020-09-23: 3 [IU] via SUBCUTANEOUS
  Administered 2020-09-24: 2 [IU] via SUBCUTANEOUS
  Administered 2020-09-24 – 2020-09-25 (×2): 3 [IU] via SUBCUTANEOUS
  Administered 2020-09-26: 5 [IU] via SUBCUTANEOUS
  Administered 2020-09-27: 3 [IU] via SUBCUTANEOUS
  Administered 2020-09-27 – 2020-09-29 (×5): 2 [IU] via SUBCUTANEOUS

## 2020-09-22 MED ORDER — ACETAMINOPHEN 325 MG PO TABS
650.0000 mg | ORAL_TABLET | Freq: Four times a day (QID) | ORAL | Status: DC | PRN
  Administered 2020-09-25 – 2020-09-28 (×4): 650 mg via ORAL
  Filled 2020-09-22 (×6): qty 2

## 2020-09-22 MED ORDER — INSULIN DEGLUDEC 100 UNIT/ML ~~LOC~~ SOPN: 5.0000 [IU] | PEN_INJECTOR | Freq: Every day | SUBCUTANEOUS | Status: DC

## 2020-09-22 MED ORDER — POTASSIUM CHLORIDE 10 MEQ/100ML IV SOLN
10.0000 meq | INTRAVENOUS | Status: AC
  Administered 2020-09-22 (×3): 10 meq via INTRAVENOUS
  Filled 2020-09-22 (×3): qty 100

## 2020-09-22 MED ORDER — BUPROPION HCL ER (XL) 150 MG PO TB24
150.0000 mg | ORAL_TABLET | Freq: Every day | ORAL | Status: DC
  Administered 2020-09-23 – 2020-09-29 (×7): 150 mg via ORAL
  Filled 2020-09-22 (×7): qty 1

## 2020-09-22 MED ORDER — MORPHINE SULFATE (PF) 2 MG/ML IV SOLN
2.0000 mg | INTRAVENOUS | Status: DC | PRN
  Administered 2020-09-23 – 2020-09-27 (×13): 2 mg via INTRAVENOUS
  Filled 2020-09-22 (×13): qty 1

## 2020-09-22 MED ORDER — OXYBUTYNIN CHLORIDE ER 5 MG PO TB24
10.0000 mg | ORAL_TABLET | Freq: Every day | ORAL | Status: DC
  Administered 2020-09-23 – 2020-09-28 (×6): 10 mg via ORAL
  Filled 2020-09-22: qty 1
  Filled 2020-09-22: qty 2
  Filled 2020-09-22 (×2): qty 1
  Filled 2020-09-22 (×5): qty 2

## 2020-09-22 MED ORDER — ONDANSETRON HCL 4 MG/2ML IJ SOLN
4.0000 mg | Freq: Four times a day (QID) | INTRAMUSCULAR | Status: DC | PRN
  Administered 2020-09-23 – 2020-09-27 (×3): 4 mg via INTRAVENOUS
  Filled 2020-09-22 (×2): qty 2

## 2020-09-22 MED ORDER — ONDANSETRON HCL 4 MG/2ML IJ SOLN
4.0000 mg | Freq: Once | INTRAMUSCULAR | Status: AC
  Administered 2020-09-22: 4 mg via INTRAVENOUS
  Filled 2020-09-22: qty 2

## 2020-09-22 MED ORDER — ACETAMINOPHEN 650 MG RE SUPP: 650.0000 mg | Freq: Four times a day (QID) | RECTAL | Status: DC | PRN

## 2020-09-22 MED ORDER — AMLODIPINE BESYLATE 10 MG PO TABS
10.0000 mg | ORAL_TABLET | Freq: Every day | ORAL | Status: DC
  Administered 2020-09-23 – 2020-09-29 (×7): 10 mg via ORAL
  Filled 2020-09-22 (×7): qty 1

## 2020-09-22 MED ORDER — LORAZEPAM 1 MG PO TABS
1.0000 mg | ORAL_TABLET | Freq: Every day | ORAL | Status: DC | PRN
  Administered 2020-09-22 – 2020-09-28 (×2): 1 mg via ORAL
  Filled 2020-09-22 (×2): qty 1

## 2020-09-22 MED ORDER — GABAPENTIN 100 MG PO CAPS
100.0000 mg | ORAL_CAPSULE | Freq: Three times a day (TID) | ORAL | Status: DC
  Administered 2020-09-23 – 2020-09-29 (×19): 100 mg via ORAL
  Filled 2020-09-22 (×19): qty 1

## 2020-09-22 MED ORDER — CARVEDILOL 6.25 MG PO TABS
6.2500 mg | ORAL_TABLET | Freq: Two times a day (BID) | ORAL | Status: DC
  Administered 2020-09-23 – 2020-09-29 (×13): 6.25 mg via ORAL
  Filled 2020-09-22 (×13): qty 1

## 2020-09-22 MED ORDER — ONDANSETRON HCL 4 MG PO TABS
4.0000 mg | ORAL_TABLET | Freq: Four times a day (QID) | ORAL | Status: DC | PRN
  Filled 2020-09-22: qty 1

## 2020-09-22 MED ORDER — ROSUVASTATIN CALCIUM 5 MG PO TABS
5.0000 mg | ORAL_TABLET | Freq: Every day | ORAL | Status: DC
  Administered 2020-09-23 – 2020-09-29 (×7): 5 mg via ORAL
  Filled 2020-09-22 (×11): qty 1

## 2020-09-22 MED ORDER — LACTATED RINGERS IV SOLN: INTRAVENOUS | Status: DC

## 2020-09-22 MED ORDER — TRAZODONE HCL 50 MG PO TABS
100.0000 mg | ORAL_TABLET | Freq: Every day | ORAL | Status: DC
  Administered 2020-09-23 – 2020-09-28 (×6): 100 mg via ORAL
  Filled 2020-09-22 (×6): qty 1

## 2020-09-22 NOTE — ED Notes (Signed)
Called and gave number to sec at [facility] to give nurse to call me back for report. No calls received as of now.

## 2020-09-22 NOTE — ED Triage Notes (Signed)
Pt has been vomiting with blood in it. No BM in 1 week. She began vomiting yesterday. Has been seen before for same. History of colitis. Hypertensive with EMS. CBG 253. Temp 97.7.

## 2020-09-22 NOTE — Progress Notes (Signed)
Pt arrived did not receive report from [place].

## 2020-09-22 NOTE — H&P (Signed)
History and Physical    Melissa Lopez ZDG:[id number] DOB: 03/19/1951 DOA: 09/22/2020  PCP: Celene Squibb, MD  Patient coming from: home  I have personally briefly reviewed patient's old medical records in [place]  Chief Complaint: abdominal pain and vomiting  HPI: Melissa Lopez is a 69 y.o. female with medical history significant of insulin-dependent diabetes, hypertension, chronic diastolic congestive heart failure, symptomatic bradycardia status post pacemaker, chronic kidney disease stage IIIb, presents emergency room with complaints of abdominal pain and vomiting.  Patient reports onset of symptoms occurring approximate 1 week ago.  She experienced lower abdominal pain which was nonradiating.  Over the past 3 days, she had persistent vomiting.  She is unable to keep anything down.  Symptoms get worse after she tries to eat.  She is not been having any diarrhea.  She is not had any fever, cough, shortness of breath, dysuria.  She has not been started on any new medications.  She is feeling dizzy and lightheaded and weak.  ED Course: Upon arrival to the emergency room, she was noted to be hypertensive, mainly because she has not been able to take her medications.  Lab work shows that she is severely hypercalcemic with a serum calcium greater than 13.  She is hypokalemic with a potassium of 3.1.  She has a leukocytosis of 24,000 as well as elevated hemoglobin of 17.  Creatinine is 1.5 which is near her baseline.  She was started on IV fluids for dehydration.  CT scan of the abdomen pelvis did not show any acute process.  She has been referred for admission.  Review of Systems:  Review of Systems  Constitutional: Negative for chills and fever.  HENT: Negative for congestion and sore throat.   Eyes: Negative for blurred vision and double vision.  Respiratory: Negative for cough and shortness of breath.   Cardiovascular: Negative for chest pain and palpitations.  Gastrointestinal:  Positive for abdominal pain, nausea and vomiting. Negative for diarrhea.  Genitourinary: Negative for dysuria.  Neurological: Positive for dizziness and weakness.      Past Medical History:  Diagnosis Date  . CKD (chronic kidney disease) stage 3, GFR 30-59 ml/min (HCC) 10/31/2018   - per patient report  . Coronary artery disease    per patient report - 2 caths with non-obstructive CAD  . Essential hypertension   . H/O cardiac pacemaker 10/31/2018  . Sick sinus syndrome ([place])   . Type 2 diabetes mellitus with complication, with long-term current use of insulin ([place]) 10/31/2018   with diabetic nephropathy    Past Surgical History:  Procedure Laterality Date  . CARDIAC PACEMAKER PLACEMENT  2008   in [place] for sick sinus syndrome  . ESOPHAGOGASTRODUODENOSCOPY (EGD) WITH PROPOFOL N/A 11/10/2018   Procedure: ESOPHAGOGASTRODUODENOSCOPY (EGD) WITH PROPOFOL;  Surgeon: Wilford Corner, MD;  Location: [facility];  Service: Endoscopy;  Laterality: N/A;  . LEFT HEART CATH AND CORONARY ANGIOGRAPHY N/A 10/31/2018   Procedure: LEFT HEART CATH AND CORONARY ANGIOGRAPHY;  Surgeon: Leonie Man, MD;  Location: [facility];  Service: Cardiovascular;  Laterality: N/A;  . PACEMAKER GENERATOR CHANGE  09/2018   in [place]    Social History:  reports that she quit smoking about 50 years ago. She quit after 3.00 years of use. She has never used smokeless tobacco. She reports previous alcohol use. She reports that she does not use drugs.  Allergies  Allergen Reactions  . Oxycodone-Acetaminophen Anaphylaxis  . Contrast Media [Iodinated  Diagnostic Agents]     Contrast Dye - the one made out of shellfish, gives a really bad reaction - swelling  . No Known Allergies   . Oxycodone     Statused by Person:  EAST, CAMELIA(CE) on [id number]  . Xarelto  [Rivaroxaban] Swelling    Family History  Family history unknown: Yes    Prior to Admission medications   Medication  Sig Start Date End Date Taking? Authorizing Provider  amLODipine (NORVASC) 10 MG tablet TAKE ONE (1) TABLET BY MOUTH ONCE DAILY *PATIENT NEEDS APPOINTMENT* Patient taking differently: Take 10 mg by mouth daily.  08/31/20  Yes Verta Ellen., NP  aspirin 81 MG chewable tablet Chew 1 tablet (81 mg total) by mouth daily. 11/13/18  Yes Dessa Phi, DO  buPROPion [facility] SR) 150 MG 12 hr tablet Take 150 mg by mouth daily. 07/13/20  Yes [provider]  carvedilol (COREG) 6.25 MG tablet Take 1 tablet (6.25 mg total) by mouth 2 (two) times daily. 08/11/20  Yes Verta Ellen., NP  escitalopram (LEXAPRO) 20 MG tablet Take 20 mg by mouth daily. 08/11/20  Yes [provider]  gabapentin (NEURONTIN) 100 MG capsule Take 100 mg by mouth 3 (three) times daily. 08/11/20  Yes [provider]  hydrALAZINE (APRESOLINE) 50 MG tablet Take 25 mg by mouth daily.    Yes [provider]  insulin degludec (TRESIBA FLEXTOUCH) 100 UNIT/ML FlexTouch Pen Inject 5 Units into the skin daily.   Yes [provider]  lisinopril (ZESTRIL) 20 MG tablet Take 1 tablet (20 mg total) by mouth daily. 08/11/20 11/09/20 Yes Verta Ellen., NP  LORazepam (ATIVAN) 1 MG tablet Take 1 mg by mouth daily as needed for anxiety. 05/10/20  Yes [provider]  metoCLOPramide (REGLAN) 5 MG tablet Take 1 tablet (5 mg total) by mouth every 6 (six) hours. 11/12/18 09/22/20 Yes Dessa Phi, DO  omeprazole (PRILOSEC) 20 MG capsule Take 20 mg by mouth daily.   Yes [provider]  oxybutynin (DITROPAN-XL) 10 MG 24 hr tablet Take 10 mg by mouth at bedtime.   Yes [provider]  rosuvastatin (CRESTOR) 5 MG tablet TAKE 1 TABLET BY MOUTH DAILY AT 6PM *PATIENT NEEDS APPOINTMENT* Patient taking differently: Take 5 mg by mouth daily.  08/31/20  Yes Verta Ellen., NP  traZODone (DESYREL) 100 MG tablet Take 100 mg by mouth at bedtime.   Yes [provider]  simethicone  (MYLICON) 277 MG chewable tablet Chew 125-250 mg by mouth every 6 (six) hours as needed for flatulence.    [provider]    Physical Exam: Vitals:   09/22/20 1307 09/22/20 1500 09/22/20 1700 09/22/20 1800  BP: (!) 197/115 (!) 139/92 (!) 167/119 (!) 149/92  Pulse: 87 62 75 75  Resp: 18 19 16 12   Temp:      TempSrc:      SpO2: 96% 93% 95% 98%  Weight:      Height:        Constitutional: NAD, calm, comfortable Eyes: PERRL, lids and conjunctivae normal ENMT: Mucous membranes are dry. Posterior pharynx clear of any exudate or lesions.Normal dentition.  Neck: normal, supple, no masses, no thyromegaly Respiratory: clear to auscultation bilaterally, no wheezing, no crackles. Normal respiratory effort. No accessory muscle use.  Cardiovascular: Regular rate and rhythm, no murmurs / rubs / gallops. No extremity edema. 2+ pedal pulses. No carotid bruits.  Abdomen: no tenderness, no masses palpated. No hepatosplenomegaly. Bowel  sounds positive.  Musculoskeletal: no clubbing / cyanosis. No joint deformity upper and lower extremities. Good ROM, no contractures. Normal muscle tone.  Skin: no rashes, lesions, ulcers. No induration Neurologic: CN 2-12 grossly intact. Sensation intact, DTR normal. Strength 5/5 in all 4.  Psychiatric: Normal judgment and insight. Alert and oriented x 3. Normal mood.    Labs on Admission: I have personally reviewed following labs and imaging studies  CBC: Recent Labs  Lab 09/22/20 1228  WBC 24.4*  NEUTROABS 21.3*  HGB 17.1*  HCT 47.6*  MCV 86.2  PLT 595   Basic Metabolic Panel: Recent Labs  Lab 09/22/20 1228  NA 135  K 3.1*  CL 101  CO2 21*  GLUCOSE 227*  BUN 28*  CREATININE 1.56*  CALCIUM 13.5*   GFR: Estimated Creatinine Clearance: 34.4 mL/min (A) (by C-G formula based on SCr of 1.56 mg/dL (H)). Liver Function Tests: Recent Labs  Lab 09/22/20 1228  AST 17  ALT 16  ALKPHOS 122  BILITOT 1.0  PROT 8.5*  ALBUMIN 4.9   Recent  Labs  Lab 09/22/20 1228  LIPASE 27   No results for input(s): AMMONIA in the last 168 hours. Coagulation Profile: No results for input(s): INR, PROTIME in the last 168 hours. Cardiac Enzymes: No results for input(s): CKTOTAL, CKMB, CKMBINDEX, TROPONINI in the last 168 hours. BNP (last 3 results) No results for input(s): PROBNP in the last 8760 hours. HbA1C: No results for input(s): HGBA1C in the last 72 hours. CBG: No results for input(s): GLUCAP in the last 168 hours. Lipid Profile: No results for input(s): CHOL, HDL, LDLCALC, TRIG, CHOLHDL, LDLDIRECT in the last 72 hours. Thyroid Function Tests: No results for input(s): TSH, T4TOTAL, FREET4, T3FREE, THYROIDAB in the last 72 hours. Anemia Panel: No results for input(s): VITAMINB12, FOLATE, FERRITIN, TIBC, IRON, RETICCTPCT in the last 72 hours. Urine analysis:    Component Value Date/Time   COLORURINE YELLOW 10/31/2018 1751   APPEARANCEUR CLOUDY (A) 10/31/2018 1751   LABSPEC 1.026 10/31/2018 1751   PHURINE 5.0 10/31/2018 1751   GLUCOSEU NEGATIVE 10/31/2018 1751   HGBUR SMALL (A) 10/31/2018 1751   BILIRUBINUR NEGATIVE 10/31/2018 1751   KETONESUR NEGATIVE 10/31/2018 1751   PROTEINUR 30 (A) 10/31/2018 1751   NITRITE NEGATIVE 10/31/2018 1751   LEUKOCYTESUR LARGE (A) 10/31/2018 1751    Radiological Exams on Admission: CT ABDOMEN PELVIS WO CONTRAST  Result Date: 09/22/2020 CLINICAL DATA:  Constipation with vomiting. History of colitis and contrast allergy. Suspected bowel obstruction. EXAM: CT ABDOMEN AND PELVIS WITHOUT CONTRAST TECHNIQUE: Multidetector CT imaging of the abdomen and pelvis was performed following the standard protocol without IV contrast. COMPARISON:  Renal ultrasound 11/01/2018 FINDINGS: Lower chest: Linear scarring or atelectasis at the left lung base. Patient has a pacemaker. There is no significant pleural or pericardial effusion. Aortic and coronary artery atherosclerosis noted. Hepatobiliary: There is  moderate intrahepatic and extrahepatic biliary dilatation post cholecystectomy. The common hepatic duct measures up to 18 mm in diameter. No evidence of calcified choledocholithiasis. The liver otherwise appears unremarkable as imaged in the noncontrast state. Pancreas: Mildly atrophied. No evidence of pancreatic ductal dilatation, mass lesion or surrounding inflammation. Spleen: Normal in size without focal abnormality. Adrenals/Urinary Tract: Both adrenal glands appear normal. Both kidneys are within normal limits for size. There are innumerable low-density renal lesions bilaterally which are incompletely characterized without contrast, but likely cysts. Some of these are associated with thin septations. No evidence of hydronephrosis, perinephric soft tissue stranding or ureteral calculus. The bladder appears  normal. Stomach/Bowel: A small amount of enteric contrast was administered. The stomach appears normal. There is no small or large bowel distension, wall thickening or surrounding inflammatory change. Some high density material is present within the sigmoid colon. The appendix is not clearly visualized. There is no pericecal inflammation. Vascular/Lymphatic: There are no enlarged abdominal or pelvic lymph nodes. Aortic and branch vessel atherosclerosis without evidence of aneurysm. Reproductive: Hysterectomy.  No adnexal mass. Other: Postsurgical changes in the low anterior abdominal wall. No evidence of hernia or ascites. Musculoskeletal: No acute or significant osseous findings. Mild degenerative changes in the lumbar spine with associated probable nonacute Schmorl's node formation in the inferior endplate of L3. IMPRESSION: 1. No acute findings or explanation for the patient's symptoms. No evidence of bowel obstruction or perforation. 2. Intrahepatic and extrahepatic biliary dilatation post cholecystectomy, likely physiologic. Correlation with liver function studies recommended. 3. Innumerable low-density  renal lesions bilaterally, incompletely characterized without contrast, but likely cysts as correlated with prior ultrasound. Given history renal failure, findings suggest polycystic kidney disease. 4. Aortic Atherosclerosis (ICD10-I70.0). Electronically Signed   By: Richardean Sale M.D.   On: 09/22/2020 13:40      Assessment/Plan Active Problems:   Type 2 diabetes mellitus with complication, with long-term current use of insulin (HCC)   CKD (chronic kidney disease) stage 3, GFR 30-59 ml/min (HCC) - per patient report   Essential hypertension   Nausea with vomiting   Hypercalcemia   Hypokalemia   Chronic diastolic CHF (congestive heart failure) (HCC)     Nausea, vomiting, abdominal pain -Unclear etiology -CT scan of the abdomen pelvis did not show any acute process -Patient is chronically on Reglan, and daughter reports that she has not been taking this regularly -Possible degree of gastroparesis -Started on IV Reglan -Continue on PPI -Start on clear liquids and advance as tolerated -She apparently had some streaking of blood in vomitus today which may be related to Mallory-Weiss tear -She is not had any further episodes in the emergency room -Follow hemoglobin  Hypercalcemia -Suspect this is secondary to dehydration -Start on IV fluid resuscitation -Check TSH and parathyroid hormone  Hypokalemia -Secondary to GI losses -Replace  Chronic kidney disease stage IIIb -Creatinine is currently near baseline -Continue to monitor  Hypertension -Blood pressure elevated on admission since she was unable to take her medications -She did receive a dose of IV labetalol with improvement -We will restart home meds  Diabetes -Continue home dose of Tresiba -Start on sliding scale insulin  Chronic diastolic congestive heart failure -Currently appears compensated -Continue to monitor volume status  DVT prophylaxis: SCD  Code Status: full code  Family Communication: updated  daughter at the bedside  Disposition Plan: discharge home once vomiting has resolved  Consults called:   Admission status: observation, tele   Kathie Dike MD [facility]   If 7PM-7AM, please contact night-coverage [email or web address]   09/22/2020, 8:04 PM

## 2020-09-22 NOTE — ED Notes (Signed)
Date and time results received: 09/22/20 1410  Test: calcium Critical Value: 13.5  Name of Provider Notified: Long  Orders Received? Or Actions Taken?: n/a

## 2020-09-22 NOTE — ED Provider Notes (Signed)
Emergency Department Provider Note   I have reviewed the triage vital signs and the nursing notes.   HISTORY  Chief Complaint Hematemesis   HPI Melissa Lopez is a 69 y.o. female with PMH reviewed below presents to the ED with abdominal pain and vomiting for the last 7 days.  Patient has noticed some streaks of blood in the vomit but nothing black.  She notes a history of colitis.  She has diffuse, generalized abdominal discomfort without fevers or chills.  She is not having pain with urination but notes decreased urine output overall.  She is having difficulty drinking fluids. She is passing flatus.   Past Medical History:  Diagnosis Date  . CKD (chronic kidney disease) stage 3, GFR 30-59 ml/min (HCC) 10/31/2018   - per patient report  . Coronary artery disease    per patient report - 2 caths with non-obstructive CAD  . Essential hypertension   . H/O cardiac pacemaker 10/31/2018  . Sick sinus syndrome ([place])   . Type 2 diabetes mellitus with complication, with Alima Naser-term current use of insulin ([place]) 10/31/2018   with diabetic nephropathy    Patient Active Problem List   Diagnosis Date Noted  . Hyperparathyroidism, primary ([place]) 09/25/2020  . Emesis   . Hypophosphatemia   . Hypokalemia 09/22/2020  . Chronic diastolic CHF (congestive heart failure) ([place]) 09/22/2020  . Noninfective gastroenteritis and colitis, unspecified 04/13/2020  . Unilateral primary osteoarthritis, right knee 04/13/2020  . Arthritis 04/13/2020  . Bradycardia 04/13/2020  . Cardiomyopathy, 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[place]) 04/13/2020  . Class 1 obesity due to excess calories with body mass index (BMI) of 34.0 to 34.9 in adult 04/13/2020  . Acute kidney failure, unspecified ([place]) 04/29/2019  . Nausea with vomiting 11/10/2018  . Malnutrition of moderate degree 11/09/2018  . AKI (acute kidney injury) ([place])   . PE (pulmonary thromboembolism) ([place])   . H/O cardiac pacemaker 10/31/2018  . Syncope 10/31/2018  . ST  elevation - with non-obstructive CAD 10/31/2018  . Chest pain with high risk of acute coronary syndrome 10/31/2018  . 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[place]) 10/31/2018  . Type 2 diabetes mellitus with complication, with Filip Luten-term current use of insulin ([place]) 10/31/2018  . CKD (chronic kidney disease) stage 3, GFR 30-59 ml/min (HCC) - per patient report 10/31/2018  . Presence of cardiac pacemaker 10/31/2018  . Essential hypertension   . Hypercalcemia 07/27/2015  . STEMI (ST elevation myocardial infarction) ([place]) 03/27/2012    Past Surgical History:  Procedure Laterality Date  . CARDIAC PACEMAKER PLACEMENT  2008   in [place] for sick [place] syndrome  . ESOPHAGOGASTRODUODENOSCOPY (EGD) WITH PROPOFOL N/A 11/10/2018   Procedure: ESOPHAGOGASTRODUODENOSCOPY (EGD) WITH PROPOFOL;  Surgeon: Wilford Corner, MD;  Location: [facility];  Service: Endoscopy;  Laterality: N/A;  . LEFT HEART CATH AND CORONARY ANGIOGRAPHY N/A 10/31/2018   Procedure: LEFT HEART CATH AND CORONARY ANGIOGRAPHY;  Surgeon: Leonie Man, MD;  Location: [facility];  Service: Cardiovascular;  Laterality: N/A;  . PACEMAKER GENERATOR CHANGE  09/2018   in [place]    Allergies Oxycodone-acetaminophen, Contrast media [iodinated diagnostic agents], No known allergies, Oxycodone, and Xarelto  [rivaroxaban]  Family History  Family history unknown: Yes    Social History Social History   Tobacco Use  . Smoking status: Former Smoker    Years: 3.00    Quit date: 12/09/1969    Years since quitting: 50.8  . Smokeless tobacco: Never Used  Substance Use Topics  . Alcohol  use: Not Currently  . Drug use: Never    Review of Systems  Constitutional: No fever/chills Eyes: No visual changes. ENT: No sore throat. Cardiovascular: Denies chest pain. Respiratory: Denies shortness of breath. Gastrointestinal: Positive abdominal pain. Positive nausea and vomiting.  No diarrhea. Positive  constipation. Genitourinary: Negative for dysuria. Musculoskeletal: Negative for back pain. Skin: Negative for rash. Neurological: Negative for headaches, focal weakness or numbness.  10-point ROS otherwise negative.  ____________________________________________   PHYSICAL EXAM:  VITAL SIGNS: ED Triage Vitals  Enc Vitals Group     BP 09/22/20 1102 (!) 184/113     Pulse Rate 09/22/20 1102 96     Resp 09/22/20 1102 20     Temp 09/22/20 1102 98.1 F (36.7 C)     Temp Source 09/22/20 1102 Oral     SpO2 09/22/20 1102 96 %     Weight 09/22/20 1057 167 lb (75.8 kg)     Height 09/22/20 1057 5\' 4"  (1.626 m)   Constitutional: Alert and oriented. Well appearing and in no acute distress. Eyes: Conjunctivae are normal.  Head: Atraumatic. Nose: No congestion/rhinnorhea. Mouth/Throat: Mucous membranes are dry.  Neck: No stridor.   Cardiovascular: Normal rate, regular rhythm. Good peripheral circulation. Grossly normal heart sounds.   Respiratory: Normal respiratory effort.  No retractions. Lungs CTAB. Gastrointestinal: Soft with diffuse mild tenderness. No distention.  Musculoskeletal: No gross deformities of extremities. Neurologic:  Normal speech and language. Skin:  Skin is warm, dry and intact. No rash noted.  ____________________________________________   LABS (all labs ordered are listed, but only abnormal results are displayed)  Labs Reviewed  URINE CULTURE - Abnormal; Notable for the following components:      Result Value   Culture   (*)    Value: 30,000 COLONIES/mL MULTIPLE SPECIES PRESENT, SUGGEST RECOLLECTION   All other components within normal limits  COMPREHENSIVE METABOLIC PANEL - Abnormal; Notable for the following components:   Potassium 3.1 (*)    CO2 21 (*)    Glucose, Bld 227 (*)    BUN 28 (*)    Creatinine, Ser 1.56 (*)    Calcium 13.5 (*)    Total Protein 8.5 (*)    GFR, Estimated 34 (*)    All other components within normal limits  CBC WITH  DIFFERENTIAL/PLATELET - Abnormal; Notable for the following components:   WBC 24.4 (*)    RBC 5.52 (*)    Hemoglobin 17.1 (*)    HCT 47.6 (*)    Neutro Abs 21.3 (*)    Monocytes Absolute 1.4 (*)    Abs Immature Granulocytes 0.11 (*)    All other components within normal limits  PTH, INTACT AND CALCIUM - Abnormal; Notable for the following components:   PTH 85 (*)    Calcium, Total (PTH) 13.5 (*)    All other components within normal limits  HEMOGLOBIN A1C - Abnormal; Notable for the following components:   Hgb A1c MFr Bld 6.4 (*)    All other components within normal limits  GLUCOSE, CAPILLARY - Abnormal; Notable for the following components:   Glucose-Capillary 136 (*)    All other components within normal limits  BASIC METABOLIC PANEL - Abnormal; Notable for the following components:   Glucose, Bld 173 (*)    BUN 31 (*)    Creatinine, Ser 1.79 (*)    Calcium 13.1 (*)    GFR, Estimated 29 (*)    All other components within normal limits  URINALYSIS, ROUTINE W REFLEX MICROSCOPIC - Abnormal;  Notable for the following components:   Color, Urine STRAW (*)    Glucose, UA 150 (*)    Hgb urine dipstick SMALL (*)    Ketones, ur 5 (*)    Protein, ur 30 (*)    Bacteria, UA RARE (*)    All other components within normal limits  PHOSPHORUS - Abnormal; Notable for the following components:   Phosphorus 1.3 (*)    All other components within normal limits  GLUCOSE, CAPILLARY - Abnormal; Notable for the following components:   Glucose-Capillary 180 (*)    All other components within normal limits  RENAL FUNCTION PANEL - Abnormal; Notable for the following components:   Sodium 131 (*)    CO2 20 (*)    Glucose, Bld 108 (*)    BUN 30 (*)    Creatinine, Ser 1.88 (*)    Calcium 10.4 (*)    Albumin 3.4 (*)    GFR, Estimated 27 (*)    All other components within normal limits  GLUCOSE, CAPILLARY - Abnormal; Notable for the following components:   Glucose-Capillary 183 (*)    All other  components within normal limits  CBC WITH DIFFERENTIAL/PLATELET - Abnormal; Notable for the following components:   WBC 15.6 (*)    Neutro Abs 11.1 (*)    Monocytes Absolute 1.7 (*)    All other components within normal limits  GLUCOSE, CAPILLARY - Abnormal; Notable for the following components:   Glucose-Capillary 140 (*)    All other components within normal limits  GLUCOSE, CAPILLARY - Abnormal; Notable for the following components:   Glucose-Capillary 137 (*)    All other components within normal limits  GLUCOSE, CAPILLARY - Abnormal; Notable for the following components:   Glucose-Capillary 159 (*)    All other components within normal limits  RENAL FUNCTION PANEL - Abnormal; Notable for the following components:   Potassium 3.2 (*)    Glucose, Bld 112 (*)    BUN 33 (*)    Creatinine, Ser 2.09 (*)    Calcium 10.6 (*)    Albumin 3.4 (*)    GFR, Estimated 24 (*)    All other components within normal limits  CBC - Abnormal; Notable for the following components:   WBC 13.3 (*)    All other components within normal limits  GLUCOSE, CAPILLARY - Abnormal; Notable for the following components:   Glucose-Capillary 155 (*)    All other components within normal limits  GLUCOSE, CAPILLARY - Abnormal; Notable for the following components:   Glucose-Capillary 113 (*)    All other components within normal limits  VITAMIN D 25 HYDROXY (VIT D DEFICIENCY, FRACTURES) - Abnormal; Notable for the following components:   Vit D, 25-Hydroxy 20.14 (*)    All other components within normal limits  GLUCOSE, CAPILLARY - Abnormal; Notable for the following components:   Glucose-Capillary 190 (*)    All other components within normal limits  TROPONIN I (HIGH SENSITIVITY) - Abnormal; Notable for the following components:   Troponin I (High Sensitivity) 32 (*)    All other components within normal limits  TROPONIN I (HIGH SENSITIVITY) - Abnormal; Notable for the following components:   Troponin I  (High Sensitivity) 39 (*)    All other components within normal limits  RESPIRATORY PANEL BY RT PCR (FLU A&B, COVID)  CULTURE, BLOOD (ROUTINE X 2)  CULTURE, BLOOD (ROUTINE X 2)  URINE CULTURE  LIPASE, BLOOD  HIV ANTIBODY (ROUTINE TESTING W REFLEX)  TSH  MAGNESIUM  MAGNESIUM  MAGNESIUM  GLUCOSE, CAPILLARY  MAGNESIUM  URINALYSIS, ROUTINE W REFLEX MICROSCOPIC  CBC WITH DIFFERENTIAL/PLATELET  HEMOGLOBIN A1C   ____________________________________________  RADIOLOGY  DG Chest 2 View  Result Date: 09/25/2020 CLINICAL DATA:  Hypercalcemia, shortness of breath EXAM: CHEST - 2 VIEW COMPARISON:  09/24/2019 FINDINGS: No focal consolidation. No pleural effusion or pneumothorax. Heart and mediastinal contours are unremarkable. Dual lead cardiac pacemaker. No acute osseous abnormality. IMPRESSION: No active cardiopulmonary disease. Electronically Signed   By: Kathreen Devoid   On: 09/25/2020 12:41   NM Parathyroid W/Spect  Result Date: 09/25/2020 CLINICAL DATA:  Hyperparathyroidism EXAM: NM PARATHYROID SCINTIGRAPHY AND SPECT IMAGING TECHNIQUE: Following intravenous administration of radiopharmaceutical, early and 2-hour delayed planar images were obtained in the anterior projection. Delayed triplanar SPECT images were also obtained at 2 hours. RADIOPHARMACEUTICALS:  24.0 mCi Tc-12m Sestamibi IV COMPARISON:  None FINDINGS: Planar imaging: Normal initial distribution of tracer within the thyroid lobes, LEFT lobe slightly larger in size than RIGHT. Delayed image shows washout of tracer from both thyroid lobes with slightly asymmetric residual tracer at the mid LEFT lobe. SPECT imaging: No abnormal sestamibi retention identified at the expected positions of the parathyroid glands to suggest parathyroid adenoma. No ectopic localization of tracer in the mediastinum. IMPRESSION: Negative parathyroid scan. Electronically Signed   By: Lavonia Dana M.D.   On: 09/25/2020 14:29     ____________________________________________   PROCEDURES  Procedure(s) performed:   Procedures  CRITICAL CARE Performed by: Margette Fast Total critical care time: 35 minutes Critical care time was exclusive of separately billable procedures and treating other patients. Critical care was necessary to treat or prevent imminent or life-threatening deterioration. Critical care was time spent personally by me on the following activities: development of treatment plan with patient and/or surrogate as well as nursing, discussions with consultants, evaluation of patient's response to treatment, examination of patient, obtaining history from patient or surrogate, ordering and performing treatments and interventions, ordering and review of laboratory studies, ordering and review of radiographic studies, pulse oximetry and re-evaluation of patient's condition.  Nanda Quinton, MD Emergency Medicine  ____________________________________________   INITIAL IMPRESSION / ASSESSMENT AND PLAN / ED COURSE  Pertinent labs & imaging results that were available during my care of the patient were reviewed by me and considered in my medical decision making (see chart for details).    Patient appears clinically dehydrated. CT ordered to r/o SBO vs other surgical process in the abdomen. She has significant leukocytosis but no fever or other markers for sepsis. She does have significantly elevated calcium even above her already elevated baseline. Plan for IV fluids, calcium, admission for dehydration and monitoring of symptoms. CT imaging of the abdomen reviewed.   Discussed patient's case with [facility] to request admission. Patient and family (if present) updated with plan. Care transferred to [facility] service.  I reviewed all nursing notes, vitals, pertinent old records, EKGs, labs, imaging (as available).    ____________________________________________  FINAL CLINICAL IMPRESSION(S) / ED DIAGNOSES  Final  diagnoses:  Hypercalcemia  Dehydration  Hypokalemia  Generalized abdominal pain  Intractable vomiting with nausea, unspecified vomiting type     MEDICATIONS GIVEN DURING THIS VISIT:  Medications  amLODipine (NORVASC) tablet 10 mg (10 mg Oral Given 09/25/20 0934)  carvedilol (COREG) tablet 6.25 mg (6.25 mg Oral Given 09/25/20 0934)  hydrALAZINE (APRESOLINE) tablet 25 mg (25 mg Oral Given 09/25/20 0935)  rosuvastatin (CRESTOR) tablet 5 mg (5 mg Oral Given 09/25/20 0935)  lisinopril (ZESTRIL) tablet 20 mg (20 mg  Oral Given 09/25/20 0933)  buPROPion (WELLBUTRIN XL) 24 hr tablet 150 mg (150 mg Oral Given 09/25/20 0933)  escitalopram (LEXAPRO) tablet 20 mg (20 mg Oral Given 09/25/20 0934)  LORazepam (ATIVAN) tablet 1 mg (1 mg Oral Given 09/22/20 2024)  traZODone (DESYREL) tablet 100 mg (100 mg Oral Given 09/24/20 2048)  metoCLOPramide (REGLAN) injection 5 mg (5 mg Intravenous Given 09/25/20 1208)  pantoprazole (PROTONIX) injection 40 mg (40 mg Intravenous Given 09/24/20 2048)  oxybutynin (DITROPAN-XL) 24 hr tablet 10 mg (10 mg Oral Given 09/24/20 2049)  gabapentin (NEURONTIN) capsule 100 mg (100 mg Oral Given 09/25/20 0935)  acetaminophen (TYLENOL) tablet 650 mg (650 mg Oral Given 09/25/20 1003)    Or  acetaminophen (TYLENOL) suppository 650 mg ( Rectal See Alternative 09/25/20 1003)  ondansetron (ZOFRAN) tablet 4 mg ( Oral See Alternative 09/23/20 1051)    Or  ondansetron (ZOFRAN) injection 4 mg (4 mg Intravenous Given 09/23/20 1051)  morphine 2 MG/ML injection 2 mg (2 mg Intravenous Given 09/25/20 1057)  insulin aspart (novoLOG) injection 0-5 Units (0 Units Subcutaneous Not Given 09/24/20 2145)  insulin aspart (novoLOG) injection 0-15 Units (3 Units Subcutaneous Given 09/25/20 1256)  insulin glargine (LANTUS) injection 5 Units (5 Units Subcutaneous Given 09/25/20 1301)  0.45 % sodium chloride infusion ( Intravenous Rate/Dose Change 09/25/20 1059)  hydrALAZINE (APRESOLINE) injection 10  mg (has no administration in time range)  prochlorperazine (COMPAZINE) injection 10 mg (has no administration in time range)  sorbitol, milk of mag, mineral oil, glycerin (SMOG) enema (960 mLs Rectal Not Given 09/23/20 2016)  feeding supplement (BOOST / RESOURCE BREEZE) liquid 1 Container (1 Container Oral Given 09/25/20 1332)  senna-docusate (Senokot-S) tablet 1 tablet (1 tablet Oral Given 09/25/20 0935)  polyethylene glycol (MIRALAX / GLYCOLAX) packet 17 g (17 g Oral Given 09/25/20 0935)  sodium chloride 0.9 % bolus 500 mL (0 mLs Intravenous Stopped 09/22/20 1337)  fentaNYL (SUBLIMAZE) injection 50 mcg (50 mcg Intravenous Given 09/22/20 1213)  ondansetron (ZOFRAN) injection 4 mg (4 mg Intravenous Given 09/22/20 1214)  labetalol (NORMODYNE) injection 10 mg (10 mg Intravenous Given 09/22/20 1337)  potassium chloride 10 mEq in 100 mL IVPB (0 mEq Intravenous Stopped 09/22/20 1854)  pantoprazole (PROTONIX) injection 80 mg (80 mg Intravenous Given 09/22/20 1501)  ondansetron (ZOFRAN) injection 4 mg (4 mg Intravenous Given 09/22/20 1802)  potassium PHOSPHATE 30 mmol in dextrose 5 % 500 mL infusion (30 mmol Intravenous New Bag/Given 09/23/20 1318)  sorbitol 70 % solution 30 mL (30 mLs Oral Given 09/23/20 1633)  potassium chloride SA (KLOR-CON) CR tablet 20 mEq (20 mEq Oral Given 09/24/20 1055)  furosemide (LASIX) injection 40 mg (40 mg Intravenous Given 09/24/20 1305)  potassium chloride SA (KLOR-CON) CR tablet 40 mEq (40 mEq Oral Given 09/25/20 0934)  technetium sestamibi (CARDIOLITE) injection 25 millicurie (24 millicuries Intravenous Contrast Given 09/25/20 1120)    Note:  This document was prepared using Dragon voice recognition software and may include unintentional dictation errors.  Nanda Quinton, MD, [facility] Emergency Medicine    Keionna Kinnaird, [facility], MD 09/25/20 1524

## 2020-09-22 NOTE — ED Notes (Signed)
Pt vomited x1.  

## 2020-09-22 NOTE — ED Notes (Signed)
Pt vomiting- will hold PO meds at this time

## 2020-09-23 ENCOUNTER — Observation Stay (HOSPITAL_COMMUNITY): Payer: Medicare Other

## 2020-09-23 DIAGNOSIS — E210 Primary hyperparathyroidism: Secondary | ICD-10-CM | POA: Diagnosis not present

## 2020-09-23 DIAGNOSIS — R079 Chest pain, unspecified: Secondary | ICD-10-CM | POA: Diagnosis not present

## 2020-09-23 DIAGNOSIS — R112 Nausea with vomiting, unspecified: Secondary | ICD-10-CM | POA: Diagnosis not present

## 2020-09-23 DIAGNOSIS — E1140 Type 2 diabetes mellitus with diabetic neuropathy, unspecified: Secondary | ICD-10-CM | POA: Diagnosis present

## 2020-09-23 DIAGNOSIS — Z91041 Radiographic dye allergy status: Secondary | ICD-10-CM | POA: Diagnosis not present

## 2020-09-23 DIAGNOSIS — E876 Hypokalemia: Secondary | ICD-10-CM | POA: Diagnosis not present

## 2020-09-23 DIAGNOSIS — K3189 Other diseases of stomach and duodenum: Secondary | ICD-10-CM | POA: Diagnosis not present

## 2020-09-23 DIAGNOSIS — I2510 Atherosclerotic heart disease of native coronary artery without angina pectoris: Secondary | ICD-10-CM | POA: Diagnosis not present

## 2020-09-23 DIAGNOSIS — Z794 Long term (current) use of insulin: Secondary | ICD-10-CM | POA: Diagnosis not present

## 2020-09-23 DIAGNOSIS — Z888 Allergy status to other drugs, medicaments and biological substances status: Secondary | ICD-10-CM | POA: Diagnosis not present

## 2020-09-23 DIAGNOSIS — E118 Type 2 diabetes mellitus with unspecified complications: Secondary | ICD-10-CM | POA: Diagnosis not present

## 2020-09-23 DIAGNOSIS — E860 Dehydration: Secondary | ICD-10-CM | POA: Diagnosis present

## 2020-09-23 DIAGNOSIS — K2960 Other gastritis without bleeding: Secondary | ICD-10-CM | POA: Diagnosis not present

## 2020-09-23 DIAGNOSIS — Z885 Allergy status to narcotic agent status: Secondary | ICD-10-CM | POA: Diagnosis not present

## 2020-09-23 DIAGNOSIS — R1110 Vomiting, unspecified: Secondary | ICD-10-CM

## 2020-09-23 DIAGNOSIS — F32A Depression, unspecified: Secondary | ICD-10-CM | POA: Diagnosis present

## 2020-09-23 DIAGNOSIS — Z950 Presence of cardiac pacemaker: Secondary | ICD-10-CM | POA: Diagnosis not present

## 2020-09-23 DIAGNOSIS — N1832 Chronic kidney disease, stage 3b: Secondary | ICD-10-CM | POA: Diagnosis not present

## 2020-09-23 DIAGNOSIS — F419 Anxiety disorder, unspecified: Secondary | ICD-10-CM | POA: Diagnosis present

## 2020-09-23 DIAGNOSIS — K319 Disease of stomach and duodenum, unspecified: Secondary | ICD-10-CM | POA: Diagnosis not present

## 2020-09-23 DIAGNOSIS — Z7982 Long term (current) use of aspirin: Secondary | ICD-10-CM | POA: Diagnosis not present

## 2020-09-23 DIAGNOSIS — E213 Hyperparathyroidism, unspecified: Secondary | ICD-10-CM | POA: Diagnosis not present

## 2020-09-23 DIAGNOSIS — K5909 Other constipation: Secondary | ICD-10-CM | POA: Diagnosis present

## 2020-09-23 DIAGNOSIS — I130 Hypertensive heart and chronic kidney disease with heart failure and stage 1 through stage 4 chronic kidney disease, or unspecified chronic kidney disease: Secondary | ICD-10-CM | POA: Diagnosis not present

## 2020-09-23 DIAGNOSIS — R0602 Shortness of breath: Secondary | ICD-10-CM | POA: Diagnosis not present

## 2020-09-23 DIAGNOSIS — I10 Essential (primary) hypertension: Secondary | ICD-10-CM | POA: Diagnosis not present

## 2020-09-23 DIAGNOSIS — E1122 Type 2 diabetes mellitus with diabetic chronic kidney disease: Secondary | ICD-10-CM | POA: Diagnosis not present

## 2020-09-23 DIAGNOSIS — R319 Hematuria, unspecified: Secondary | ICD-10-CM | POA: Diagnosis present

## 2020-09-23 DIAGNOSIS — E1143 Type 2 diabetes mellitus with diabetic autonomic (poly)neuropathy: Secondary | ICD-10-CM | POA: Diagnosis not present

## 2020-09-23 DIAGNOSIS — K3184 Gastroparesis: Secondary | ICD-10-CM | POA: Diagnosis not present

## 2020-09-23 DIAGNOSIS — K219 Gastro-esophageal reflux disease without esophagitis: Secondary | ICD-10-CM | POA: Diagnosis not present

## 2020-09-23 DIAGNOSIS — I5032 Chronic diastolic (congestive) heart failure: Secondary | ICD-10-CM | POA: Diagnosis not present

## 2020-09-23 DIAGNOSIS — Z20822 Contact with and (suspected) exposure to covid-19: Secondary | ICD-10-CM | POA: Diagnosis not present

## 2020-09-23 LAB — URINALYSIS, ROUTINE W REFLEX MICROSCOPIC
Bilirubin Urine: NEGATIVE
Glucose, UA: 150 mg/dL — AB
Ketones, ur: 5 mg/dL — AB
Leukocytes,Ua: NEGATIVE
Nitrite: NEGATIVE
Protein, ur: 30 mg/dL — AB
Specific Gravity, Urine: 1.009 (ref 1.005–1.030)
pH: 7 (ref 5.0–8.0)

## 2020-09-23 LAB — BASIC METABOLIC PANEL
Anion gap: 15 (ref 5–15)
BUN: 31 mg/dL — ABNORMAL HIGH (ref 8–23)
CO2: 22 mmol/L (ref 22–32)
Calcium: 13.1 mg/dL (ref 8.9–10.3)
Chloride: 101 mmol/L (ref 98–111)
Creatinine, Ser: 1.79 mg/dL — ABNORMAL HIGH (ref 0.44–1.00)
GFR, Estimated: 29 mL/min — ABNORMAL LOW (ref >60)
Glucose, Bld: 173 mg/dL — ABNORMAL HIGH (ref 70–99)
Potassium: 3.8 mmol/L (ref 3.5–5.1)
Sodium: 138 mmol/L (ref 135–145)

## 2020-09-23 LAB — GLUCOSE, CAPILLARY
Glucose-Capillary: 136 mg/dL — ABNORMAL HIGH (ref 70–99)
Glucose-Capillary: 180 mg/dL — ABNORMAL HIGH (ref 70–99)
Glucose-Capillary: 183 mg/dL — ABNORMAL HIGH (ref 70–99)
Glucose-Capillary: 140 mg/dL — ABNORMAL HIGH (ref 70–99)

## 2020-09-23 LAB — PHOSPHORUS: Phosphorus: 1.3 mg/dL — ABNORMAL LOW (ref 2.5–4.6)

## 2020-09-23 LAB — MAGNESIUM: Magnesium: 2.1 mg/dL (ref 1.7–2.4)

## 2020-09-23 LAB — HIV ANTIBODY (ROUTINE TESTING W REFLEX): HIV Screen 4th Generation wRfx: NONREACTIVE

## 2020-09-23 MED ORDER — SORBITOL 70 % SOLN
30.0000 mL | Status: AC
  Administered 2020-09-23 (×2): 30 mL via ORAL
  Filled 2020-09-23 (×2): qty 30

## 2020-09-23 MED ORDER — PROCHLORPERAZINE EDISYLATE 10 MG/2ML IJ SOLN: 10.0000 mg | Freq: Four times a day (QID) | INTRAMUSCULAR | Status: DC | PRN

## 2020-09-23 MED ORDER — HYDRALAZINE HCL 20 MG/ML IJ SOLN: 10.0000 mg | Freq: Four times a day (QID) | INTRAMUSCULAR | Status: DC | PRN

## 2020-09-23 MED ORDER — SODIUM CHLORIDE 0.45 % IV SOLN: INTRAVENOUS | Status: DC

## 2020-09-23 MED ORDER — SORBITOL 70 % SOLN
960.0000 mL | TOPICAL_OIL | Freq: Once | ORAL | Status: DC
  Filled 2020-09-23: qty 473

## 2020-09-23 MED ORDER — POTASSIUM PHOSPHATES 15 MMOLE/5ML IV SOLN
30.0000 mmol | Freq: Once | INTRAVENOUS | Status: AC
  Administered 2020-09-23: 30 mmol via INTRAVENOUS
  Filled 2020-09-23: qty 10

## 2020-09-23 NOTE — Progress Notes (Signed)
Patient initially refused SMOG enema, but then changed mind.  As RN was shaking solution, patient states she had to use restroom. Ambulated patient to bathroom where large amts. Of flatus and loose stool noted.  Patient to bathroom x 2 more episodes with more liquid stool with particles noted in toliet.  Patient wanted to hold off on enema, "I think I'll have to go again."

## 2020-09-23 NOTE — Progress Notes (Signed)
CRITICAL VALUE ALERT  Critical Value:  Calcium 13.1  Date & Time Notied:  09/23/2020 1203  Provider Notified: Dr. Grandville Silos  Orders Received/Actions taken: Aware, continue to monitor

## 2020-09-23 NOTE — Progress Notes (Signed)
PROGRESS NOTE    Melissa Lopez  MWU:[id number] DOB: 07-Oct-1951 DOA: 09/22/2020 PCP: Celene Squibb, MD    Chief Complaint  Patient presents with  . Hematemesis    Brief Narrative:  HPI per Dr. Garfield Cornea Niese is a 69 y.o. female with medical history significant of insulin-dependent diabetes, hypertension, chronic diastolic congestive heart failure, symptomatic bradycardia status post pacemaker, chronic kidney disease stage IIIb, presents emergency room with complaints of abdominal pain and vomiting.  Patient reports onset of symptoms occurring approximate 1 week ago.  She experienced lower abdominal pain which was nonradiating.  Over the past 3 days, she had persistent vomiting.  She is unable to keep anything down.  Symptoms get worse after she tries to eat.  She is not been having any diarrhea.  She is not had any fever, cough, shortness of breath, dysuria.  She has not been started on any new medications.  She is feeling dizzy and lightheaded and weak.  ED Course: Upon arrival to the emergency room, she was noted to be hypertensive, mainly because she has not been able to take her medications.  Lab work shows that she is severely hypercalcemic with a serum calcium greater than 13.  She is hypokalemic with a potassium of 3.1.  She has a leukocytosis of 24,000 as well as elevated hemoglobin of 17.  Creatinine is 1.5 which is near her baseline.  She was started on IV fluids for dehydration.  CT scan of the abdomen pelvis did not show any acute process.  She has been referred for admission  Assessment & Plan:   Active Problems:   Type 2 diabetes mellitus with complication, with long-term current use of insulin (HCC)   CKD (chronic kidney disease) stage 3, GFR 30-59 ml/min (HCC) - per patient report   Essential hypertension   Nausea with vomiting   Hypercalcemia   Hypokalemia   Chronic diastolic CHF (congestive heart failure) (HCC)   Emesis   Hypophosphatemia  1 nausea vomiting  abdominal pain Likely secondary to hypercalcemia and probable constipation.  Patient stated having had a bowel movement about 4 to 5 days.  CT abdomen and pelvis done with no acute abnormalities noted.  Patient noted to be chronically on Reglan however due to nausea and vomiting has not been able to take those regularly.  Concern for possible gastroparesis.  Continue IV Reglan, PPI, clears.  Patient with no further emesis.  Sorbitol 30cc p.o. every 2 hours x2 doses, soapsuds enema.  If adequate bowel movement will give a SMOG enema.  2.  Hypercalcemia Questionable etiology.  Concern may be secondary to dehydration.  Patient however with chronic kidney disease stage III and may be secondary to secondary hyperparathyroidism.  Phosphorus levels decreased.  PTH pending.  Magnesium within normal limits.  TSH within normal limits.  Patient on IV fluids.  Calcium level slowly trending down.  Give a 1 L bolus of normal saline.  Change IV fluids to half-normal saline and increase to 150 cc an hour.  Follow.  3.  Hypophosphatemia K-Phos 30 mmol IV x1.  Repeat labs in the morning.  4.  Hypokalemia Secondary to GI losses.  Repleted.  Remove potassium from IV fluids.  Follow.  5.  Chronic kidney disease stage IIIb At baseline.  Follow.  6.  Hypertension Blood pressure elevated.  Patient resumed back on home regimen which we will continue for now.  Continue current regimen of lisinopril, Norvasc, hydralazine, Coreg.  Could uptitrate if needed for better blood  pressure control.  7.  Diabetes mellitus type 2 Last hemoglobin A1c in the system 7.0 (10/31/2018 ) repeat hemoglobin A1c.  CBG 136 this morning.  Continue Lantus 5 units daily, sliding scale insulin, IV Reglan.  8.  Chronic diastolic CHF/history of symptomatic bradycardia status post pacemaker Compensated.  Continue cardiac regimen of lisinopril, Coreg, hydralazine, Norvasc.  Monitor volume status with hydration.  Follow.  9.  Constipation Sorbitol  p.o. x2 doses.  Soapsuds enema.  If no improvement smog enema.    DVT prophylaxis: SCDs Code Status: Full Family Communication: Updated patient.  No family at bedside. Disposition:   Status is: Inpatient    Dispo: The patient is from: Home              Anticipated d/c is to: Home              Anticipated d/c date is: 3 to 4 days.              Patient currently on IV fluids, hyper calcemic, hypomagnesemic, constipated.  Not stable for discharge.       Consultants:   None  Procedures:   CT abdomen and pelvis 09/22/2020  Chest x-ray 09/23/2020    Antimicrobials:   None   Subjective: Patient laying in bed.  Feels a little bit better.  Still with some nausea.  No emesis.  Stated has not had a bowel movement about 4 to 5 days.  Denies any chest pain.  No shortness of breath.  Objective: Vitals:   09/23/20 0955 09/23/20 1002 09/23/20 1013 09/23/20 1316  BP: (!) 153/92 (!) 153/92 (!) 153/92 (!) 174/108  Pulse:  78 76 86  Resp:  20  17  Temp:  97.9 F (36.6 C) 97.9 F (36.6 C) 98.3 F (36.8 C)  TempSrc:  Oral Oral Oral  SpO2:  97% 98% 99%  Weight:      Height:        Intake/Output Summary (Last 24 hours) at 09/23/2020 1729 Last data filed at 09/23/2020 1227 Gross per 24 hour  Intake 240 ml  Output 600 ml  Net -360 ml   Filed Weights   09/22/20 1057  Weight: 75.8 kg    Examination:  General exam: NAD. Dry mucous membranes. Respiratory system: Clear to auscultation. Respiratory effort normal. Cardiovascular system: S1 & S2 heard, RRR. No JVD, murmurs, rubs, gallops or clicks. No pedal edema. Gastrointestinal system: Abdomen is nondistended, soft and some diffuse tenderness to palpation.  No rebound.  No guarding.  Positive bowel sounds.  Central nervous system: Alert and oriented. No focal neurological deficits. Extremities: Symmetric 5 x 5 power. Skin: No rashes, lesions or ulcers Psychiatry: Judgement and insight appear normal. Mood & affect  appropriate.     Data Reviewed: I have personally reviewed following labs and imaging studies  CBC: Recent Labs  Lab 09/22/20 1228  WBC 24.4*  NEUTROABS 21.3*  HGB 17.1*  HCT 47.6*  MCV 86.2  PLT 154    Basic Metabolic Panel: Recent Labs  Lab 09/22/20 1228 09/22/20 2032 09/23/20 1123  NA 135  --  138  K 3.1*  --  3.8  CL 101  --  101  CO2 21*  --  22  GLUCOSE 227*  --  173*  BUN 28*  --  31*  CREATININE 1.56*  --  1.79*  CALCIUM 13.5*  --  13.1*  MG  --  2.2 2.1  PHOS  --   --  1.3*  GFR: Estimated Creatinine Clearance: 30 mL/min (A) (by C-G formula based on SCr of 1.79 mg/dL (H)).  Liver Function Tests: Recent Labs  Lab 09/22/20 1228  AST 17  ALT 16  ALKPHOS 122  BILITOT 1.0  PROT 8.5*  ALBUMIN 4.9    CBG: Recent Labs  Lab 09/23/20 0741 09/23/20 1200 09/23/20 1706  GLUCAP 136* 180* 183*     Recent Results (from the past 240 hour(s))  Respiratory Panel by RT PCR (Flu A&B, Covid) - Nasopharyngeal Swab     Status: None   Collection Time: 09/22/20  8:25 PM   Specimen: Nasopharyngeal Swab  Result Value Ref Range Status   SARS Coronavirus 2 by RT PCR NEGATIVE NEGATIVE Final    Comment: (NOTE) SARS-CoV-2 target nucleic acids are NOT DETECTED.  The SARS-CoV-2 RNA is generally detectable in upper respiratoy specimens during the acute phase of infection. The lowest concentration of SARS-CoV-2 viral copies this assay can detect is 131 copies/mL. A negative result does not preclude SARS-Cov-2 infection and should not be used as the sole basis for treatment or other patient management decisions. A negative result may occur with  improper specimen collection/handling, submission of specimen other than nasopharyngeal swab, presence of viral mutation(s) within the areas targeted by this assay, and inadequate number of viral copies (<131 copies/mL). A negative result must be combined with clinical observations, patient history, and epidemiological  information. The expected result is Negative.  Fact Sheet for Patients:  PinkCheek.be  Fact Sheet for Healthcare Providers:  GravelBags.it  This test is no t yet approved or cleared by the [place] FDA and  has been authorized for detection and/or diagnosis of SARS-CoV-2 by FDA under an Emergency Use Authorization (EUA). This EUA will remain  in effect (meaning this test can be used) for the duration of the COVID-19 declaration under Section 564(b)(1) of the Act, 21 U.S.C. section 360bbb-3(b)(1), unless the authorization is terminated or revoked sooner.     Influenza A by PCR NEGATIVE NEGATIVE Final   Influenza B by PCR NEGATIVE NEGATIVE Final    Comment: (NOTE) The Xpert Xpress SARS-CoV-2/FLU/RSV assay is intended as an aid in  the diagnosis of influenza from Nasopharyngeal swab specimens and  should not be used as a sole basis for treatment. Nasal washings and  aspirates are unacceptable for Xpert Xpress SARS-CoV-2/FLU/RSV  testing.  Fact Sheet for Patients: PinkCheek.be  Fact Sheet for Healthcare Providers: GravelBags.it  This test is not yet approved or cleared by the [place] FDA and  has been authorized for detection and/or diagnosis of SARS-CoV-2 by  FDA under an Emergency Use Authorization (EUA). This EUA will remain  in effect (meaning this test can be used) for the duration of the  Covid-19 declaration under Section 564(b)(1) of the Act, 21  U.S.C. section 360bbb-3(b)(1), unless the authorization is  terminated or revoked. Performed at [facility], [address], [place], [place] [address]          Radiology Studies: CT ABDOMEN PELVIS WO CONTRAST  Result Date: 09/22/2020 CLINICAL DATA:  Constipation with vomiting. History of colitis and contrast allergy. Suspected bowel obstruction. EXAM: CT ABDOMEN AND PELVIS WITHOUT CONTRAST  TECHNIQUE: Multidetector CT imaging of the abdomen and pelvis was performed following the standard protocol without IV contrast. COMPARISON:  Renal ultrasound 11/01/2018 FINDINGS: Lower chest: Linear scarring or atelectasis at the left lung base. Patient has a pacemaker. There is no significant pleural or pericardial effusion. Aortic and coronary artery atherosclerosis noted. Hepatobiliary: There is  moderate intrahepatic and extrahepatic biliary dilatation post cholecystectomy. The common hepatic duct measures up to 18 mm in diameter. No evidence of calcified choledocholithiasis. The liver otherwise appears unremarkable as imaged in the noncontrast state. Pancreas: Mildly atrophied. No evidence of pancreatic ductal dilatation, mass lesion or surrounding inflammation. Spleen: Normal in size without focal abnormality. Adrenals/Urinary Tract: Both adrenal glands appear normal. Both kidneys are within normal limits for size. There are innumerable low-density renal lesions bilaterally which are incompletely characterized without contrast, but likely cysts. Some of these are associated with thin septations. No evidence of hydronephrosis, perinephric soft tissue stranding or ureteral calculus. The bladder appears normal. Stomach/Bowel: A small amount of enteric contrast was administered. The stomach appears normal. There is no small or large bowel distension, wall thickening or surrounding inflammatory change. Some high density material is present within the sigmoid colon. The appendix is not clearly visualized. There is no pericecal inflammation. Vascular/Lymphatic: There are no enlarged abdominal or pelvic lymph nodes. Aortic and branch vessel atherosclerosis without evidence of aneurysm. Reproductive: Hysterectomy.  No adnexal mass. Other: Postsurgical changes in the low anterior abdominal wall. No evidence of hernia or ascites. Musculoskeletal: No acute or significant osseous findings. Mild degenerative changes in the  lumbar spine with associated probable nonacute Schmorl's node formation in the inferior endplate of L3. IMPRESSION: 1. No acute findings or explanation for the patient's symptoms. No evidence of bowel obstruction or perforation. 2. Intrahepatic and extrahepatic biliary dilatation post cholecystectomy, likely physiologic. Correlation with liver function studies recommended. 3. Innumerable low-density renal lesions bilaterally, incompletely characterized without contrast, but likely cysts as correlated with prior ultrasound. Given history renal failure, findings suggest polycystic kidney disease. 4. Aortic Atherosclerosis (ICD10-I70.0). Electronically Signed   By: Richardean Sale M.D.   On: 09/22/2020 13:40   DG Chest 2 View  Result Date: 09/23/2020 CLINICAL DATA:  Chest pain EXAM: CHEST - 2 VIEW COMPARISON:  November 01, 2018 FINDINGS: Lungs are clear. Heart size and pulmonary vascularity are normal. No adenopathy. Pacemaker leads are attached to the right atrium and right ventricle. No pneumothorax. No bone lesions. IMPRESSION: Pacemaker leads attached to right atrium and right ventricle. Heart size normal. Lungs clear. Electronically Signed   By: Lowella Grip III M.D.   On: 09/23/2020 11:34        Scheduled Meds: . amLODipine  10 mg Oral Daily  . buPROPion  150 mg Oral Daily  . carvedilol  6.25 mg Oral BID  . escitalopram  20 mg Oral Daily  . gabapentin  100 mg Oral TID  . hydrALAZINE  25 mg Oral Daily  . insulin aspart  0-15 Units Subcutaneous TID WC  . insulin aspart  0-5 Units Subcutaneous QHS  . insulin glargine  5 Units Subcutaneous Daily  . lisinopril  20 mg Oral Daily  . metoCLOPramide (REGLAN) injection  5 mg Intravenous Q6H  . oxybutynin  10 mg Oral QHS  . pantoprazole (PROTONIX) IV  40 mg Intravenous Q24H  . rosuvastatin  5 mg Oral Daily  . traZODone  100 mg Oral QHS   Continuous Infusions: . sodium chloride 125 mL/hr at 09/23/20 1316  . potassium PHOSPHATE IVPB (in  mmol) 30 mmol (09/23/20 1318)     LOS: 0 days    Time spent: 35 minutes    Irine Seal, MD [facility]   To contact the attending provider between 7A-7P or the covering provider during after hours 7P-7A, please log into the web site [email or web address] and access using universal [facility] password for that web site. If you do not have the password, please call the hospital operator.  09/23/2020, 5:29 PM

## 2020-09-24 ENCOUNTER — Encounter (HOSPITAL_COMMUNITY): Payer: Self-pay | Admitting: Internal Medicine

## 2020-09-24 ENCOUNTER — Other Ambulatory Visit: Payer: Self-pay

## 2020-09-24 DIAGNOSIS — R112 Nausea with vomiting, unspecified: Secondary | ICD-10-CM | POA: Diagnosis not present

## 2020-09-24 DIAGNOSIS — I5032 Chronic diastolic (congestive) heart failure: Secondary | ICD-10-CM | POA: Diagnosis not present

## 2020-09-24 DIAGNOSIS — N1832 Chronic kidney disease, stage 3b: Secondary | ICD-10-CM | POA: Diagnosis not present

## 2020-09-24 LAB — CBC WITH DIFFERENTIAL/PLATELET
Abs Immature Granulocytes: 0.06 10*3/uL (ref 0.00–0.07)
Basophils Absolute: 0.1 10*3/uL (ref 0.0–0.1)
Basophils Relative: 0 %
Eosinophils Absolute: 0.1 10*3/uL (ref 0.0–0.5)
Eosinophils Relative: 1 %
HCT: 39.8 % (ref 36.0–46.0)
Hemoglobin: 13.8 g/dL (ref 12.0–15.0)
Immature Granulocytes: 0 %
Lymphocytes Relative: 17 %
Lymphs Abs: 2.6 10*3/uL (ref 0.7–4.0)
MCH: 31 pg (ref 26.0–34.0)
MCHC: 34.7 g/dL (ref 30.0–36.0)
MCV: 89.4 fL (ref 80.0–100.0)
Monocytes Absolute: 1.7 10*3/uL — ABNORMAL HIGH (ref 0.1–1.0)
Monocytes Relative: 11 %
Neutro Abs: 11.1 10*3/uL — ABNORMAL HIGH (ref 1.7–7.7)
Neutrophils Relative %: 71 %
Platelets: 188 10*3/uL (ref 150–400)
RBC: 4.45 MIL/uL (ref 3.87–5.11)
RDW: 12.4 % (ref 11.5–15.5)
WBC: 15.6 10*3/uL — ABNORMAL HIGH (ref 4.0–10.5)
nRBC: 0 % (ref 0.0–0.2)

## 2020-09-24 LAB — RENAL FUNCTION PANEL
Albumin: 3.4 g/dL — ABNORMAL LOW (ref 3.5–5.0)
Anion gap: 9 (ref 5–15)
BUN: 30 mg/dL — ABNORMAL HIGH (ref 8–23)
CO2: 20 mmol/L — ABNORMAL LOW (ref 22–32)
Calcium: 10.4 mg/dL — ABNORMAL HIGH (ref 8.9–10.3)
Chloride: 102 mmol/L (ref 98–111)
Creatinine, Ser: 1.88 mg/dL — ABNORMAL HIGH (ref 0.44–1.00)
GFR, Estimated: 27 mL/min — ABNORMAL LOW (ref >60)
Glucose, Bld: 108 mg/dL — ABNORMAL HIGH (ref 70–99)
Phosphorus: 2.9 mg/dL (ref 2.5–4.6)
Potassium: 3.5 mmol/L (ref 3.5–5.1)
Sodium: 131 mmol/L — ABNORMAL LOW (ref 135–145)

## 2020-09-24 LAB — PTH, INTACT AND CALCIUM
Calcium, Total (PTH): 13.5 mg/dL (ref 8.7–10.3)
PTH: 85 pg/mL — ABNORMAL HIGH (ref 15–65)

## 2020-09-24 LAB — MAGNESIUM: Magnesium: 2 mg/dL (ref 1.7–2.4)

## 2020-09-24 LAB — GLUCOSE, CAPILLARY
Glucose-Capillary: 137 mg/dL — ABNORMAL HIGH (ref 70–99)
Glucose-Capillary: 159 mg/dL — ABNORMAL HIGH (ref 70–99)
Glucose-Capillary: 79 mg/dL (ref 70–99)
Glucose-Capillary: 155 mg/dL — ABNORMAL HIGH (ref 70–99)

## 2020-09-24 LAB — URINE CULTURE: Culture: 30000 — AB

## 2020-09-24 MED ORDER — BOOST / RESOURCE BREEZE PO LIQD CUSTOM
1.0000 | Freq: Three times a day (TID) | ORAL | Status: DC
  Administered 2020-09-24 – 2020-09-29 (×7): 1 via ORAL

## 2020-09-24 MED ORDER — POTASSIUM CHLORIDE CRYS ER 20 MEQ PO TBCR
20.0000 meq | EXTENDED_RELEASE_TABLET | Freq: Once | ORAL | Status: AC
  Administered 2020-09-24: 20 meq via ORAL
  Filled 2020-09-24: qty 1

## 2020-09-24 MED ORDER — FUROSEMIDE 10 MG/ML IJ SOLN
40.0000 mg | Freq: Once | INTRAMUSCULAR | Status: AC
  Administered 2020-09-24: 40 mg via INTRAVENOUS
  Filled 2020-09-24: qty 4

## 2020-09-24 MED ORDER — SENNOSIDES-DOCUSATE SODIUM 8.6-50 MG PO TABS
1.0000 | ORAL_TABLET | Freq: Two times a day (BID) | ORAL | Status: DC
  Administered 2020-09-24 – 2020-09-29 (×10): 1 via ORAL
  Filled 2020-09-24 (×10): qty 1

## 2020-09-24 NOTE — Progress Notes (Addendum)
PROGRESS NOTE    Melissa Lopez  FAO:[id number] DOB: March 07, 1951 DOA: 09/22/2020 PCP: Celene Squibb, MD    Chief Complaint  Patient presents with  . Hematemesis    Brief Narrative:  HPI per Dr. Garfield Cornea Lopez is a 69 y.o. female with medical history significant of insulin-dependent diabetes, hypertension, chronic diastolic congestive heart failure, symptomatic bradycardia status post pacemaker, chronic kidney disease stage IIIb, presents emergency room with complaints of abdominal pain and vomiting.  Patient reports onset of symptoms occurring approximate 1 week ago.  She experienced lower abdominal pain which was nonradiating.  Over the past 3 days, she had persistent vomiting.  She is unable to keep anything down.  Symptoms get worse after she tries to eat.  She is not been having any diarrhea.  She is not had any fever, cough, shortness of breath, dysuria.  She has not been started on any new medications.  She is feeling dizzy and lightheaded and weak.  ED Course: Upon arrival to the emergency room, she was noted to be hypertensive, mainly because she has not been able to take her medications.  Lab work shows that she is severely hypercalcemic with a serum calcium greater than 13.  She is hypokalemic with a potassium of 3.1.  She has a leukocytosis of 24,000 as well as elevated hemoglobin of 17.  Creatinine is 1.5 which is near her baseline.  She was started on IV fluids for dehydration.  CT scan of the abdomen pelvis did not show any acute process.  She has been referred for admission  Assessment & Plan:   Active Problems:   Type 2 diabetes mellitus with complication, with long-term current use of insulin (HCC)   CKD (chronic kidney disease) stage 3, GFR 30-59 ml/min (HCC) - per patient report   Essential hypertension   Nausea with vomiting   Hypercalcemia   Hypokalemia   Chronic diastolic CHF (congestive heart failure) (HCC)   Emesis   Hypophosphatemia  1 nausea vomiting  abdominal pain Likely secondary to hypercalcemia and constipation.  Patient stated having had a bowel movement about 4 to 5 days.  CT abdomen and pelvis done with no acute abnormalities noted.  Patient noted to be chronically on Reglan however due to nausea and vomiting has not been able to take those regularly.  Concern for possible gastroparesis.  Tolerating clear liquids.  Patient given sorbitol and a soapsuds enema with good results in terms of constipation.  Tolerating clears.  Patient asking for diet to be advanced.  Continue IV Reglan, PPI.  Advance to full liquid diet and if tolerating will advance to a soft diet.  Supportive care.   2.  Hypercalcemia Questionable etiology.  Concern may be secondary to dehydration.  Patient however with chronic kidney disease stage III and may be secondary to secondary hyperparathyroidism.  Phosphorus levels decreased.  PTH pending.  Magnesium within normal limits.  TSH within normal limits.  Patient on IV fluids with improvement with hypercalcemia.  Calcium level at 10.4 today.  Decrease IV fluid to 100 cc/h.  Lasix 40 mg IV x1.  Follow.  3.  Hypophosphatemia Repleted.  Follow.   4.  Hypokalemia Secondary to GI losses.  Potassium at 3.5.  K. Dur 20 mEq p.o. x1.  Follow.    5.  Chronic kidney disease stage IIIb At baseline.  Follow.  6.  Hypertension Blood pressure elevated.  Continue home regimen Coreg, lisinopril, Norvasc, hydralazine.  We will give a dose of Lasix 40 mg  IV x1 secondary to hypercalcemia.  Follow.    7.  Diabetes mellitus type 2 Last hemoglobin A1c in the system 7.0 (10/31/2018 ) repeat hemoglobin A1c pending.  CBG 137 this morning.  Continue Lantus 5 units daily, sliding scale insulin, IV Reglan, Neurontin.  Follow.  8.  Chronic diastolic CHF/history of symptomatic bradycardia status post pacemaker Compensated.  Patient being aggressively hydrated with IV fluids due to hypercalcemia.  We will give a dose of Lasix 40 mg IV x1 due to  hypercalcemia.  Continue cardiac regimen of lisinopril, Coreg, hydralazine, Norvasc.  Monitor volume status with hydration.  Follow.  9.  Constipation Patient given oral sorbitol, soapsuds enema.  Smog enema was ordered however overnight patient noted to have significant amount of stools and as such smog enema not given.  Place on Senokot-S twice daily.     DVT prophylaxis: SCDs Code Status: Full Family Communication: Updated patient. Updated daughter Melissa Lopez on telephone.  Disposition:   Status is: Inpatient    Dispo: The patient is from: Home              Anticipated d/c is to: Home              Anticipated d/c date is: 3 to 4 days.              Patient currently on IV fluids, hypercalcemic, hypomagnesemic, constipated.  Not stable for discharge.       Consultants:   None  Procedures:   CT abdomen and pelvis 09/22/2020  Chest x-ray 09/23/2020    Antimicrobials:   None   Subjective: Patient states she is feeling better.  Patient noted to have multiple bowel movements overnight after soapsuds enema and sorbitol.  Denies any further emesis.  Tolerating clears.  Asking for diet to be advanced.    Objective: Vitals:   09/23/20 1316 09/23/20 1732 09/23/20 2200 09/24/20 0500  BP: (!) 174/108 (!) 165/98 (!) 150/90 (!) 166/92  Pulse: 86 79 86 78  Resp: 17  18 20   Temp: 98.3 F (36.8 C)  98 F (36.7 C) 98.4 F (36.9 C)  TempSrc: Oral  Oral Oral  SpO2: 99%     Weight:      Height:        Intake/Output Summary (Last 24 hours) at 09/24/2020 1202 Last data filed at 09/24/2020 0445 Gross per 24 hour  Intake 3370 ml  Output 300 ml  Net 3070 ml   Filed Weights   09/22/20 1057  Weight: 75.8 kg    Examination:  General exam: NAD.  Respiratory system: Some diffuse coarse rhonchorous breath sounds.  No wheezing.  Speaking in full sentences.  Cardiovascular system: Regular rate rhythm no murmurs rubs or gallops.  No JVD.  No lower extremity edema.     Gastrointestinal system: Abdomen is soft, nontender, nondistended, positive bowel sounds.  No rebound.  No guarding. Central nervous system: Alert and oriented. No focal neurological deficits. Extremities: Symmetric 5 x 5 power. Skin: No rashes, lesions or ulcers Psychiatry: Judgement and insight appear normal. Mood & affect appropriate.     Data Reviewed: I have personally reviewed following labs and imaging studies  CBC: Recent Labs  Lab 09/22/20 1228 09/24/20 0658  WBC 24.4* 15.6*  NEUTROABS 21.3* 11.1*  HGB 17.1* 13.8  HCT 47.6* 39.8  MCV 86.2 89.4  PLT 273 956    Basic Metabolic Panel: Recent Labs  Lab 09/22/20 1228 09/22/20 2032 09/23/20 1123 09/24/20 0644 09/24/20 0658  NA  135  --  138 131*  --   K 3.1*  --  3.8 3.5  --   CL 101  --  101 102  --   CO2 21*  --  22 20*  --   GLUCOSE 227*  --  173* 108*  --   BUN 28*  --  31* 30*  --   CREATININE 1.56*  --  1.79* 1.88*  --   CALCIUM 13.5*  --  13.1* 10.4*  --   MG  --  2.2 2.1  --  2.0  PHOS  --   --  1.3* 2.9  --     GFR: Estimated Creatinine Clearance: 28.5 mL/min (A) (by C-G formula based on SCr of 1.88 mg/dL (H)).  Liver Function Tests: Recent Labs  Lab 09/22/20 1228 09/24/20 0644  AST 17  --   ALT 16  --   ALKPHOS 122  --   BILITOT 1.0  --   PROT 8.5*  --   ALBUMIN 4.9 3.4*    CBG: Recent Labs  Lab 09/23/20 0741 09/23/20 1200 09/23/20 1706 09/23/20 2219 09/24/20 0748  GLUCAP 136* 180* 183* 140* 137*     Recent Results (from the past 240 hour(s))  Respiratory Panel by RT PCR (Flu A&B, Covid) - Nasopharyngeal Swab     Status: None   Collection Time: 09/22/20  8:25 PM   Specimen: Nasopharyngeal Swab  Result Value Ref Range Status   SARS Coronavirus 2 by RT PCR NEGATIVE NEGATIVE Final    Comment: (NOTE) SARS-CoV-2 target nucleic acids are NOT DETECTED.  The SARS-CoV-2 RNA is generally detectable in upper respiratoy specimens during the acute phase of infection. The  lowest concentration of SARS-CoV-2 viral copies this assay can detect is 131 copies/mL. A negative result does not preclude SARS-Cov-2 infection and should not be used as the sole basis for treatment or other patient management decisions. A negative result may occur with  improper specimen collection/handling, submission of specimen other than nasopharyngeal swab, presence of viral mutation(s) within the areas targeted by this assay, and inadequate number of viral copies (<131 copies/mL). A negative result must be combined with clinical observations, patient history, and epidemiological information. The expected result is Negative.  Fact Sheet for Patients:  PinkCheek.be  Fact Sheet for Healthcare Providers:  GravelBags.it  This test is no t yet approved or cleared by the [place] FDA and  has been authorized for detection and/or diagnosis of SARS-CoV-2 by FDA under an Emergency Use Authorization (EUA). This EUA will remain  in effect (meaning this test can be used) for the duration of the COVID-19 declaration under Section 564(b)(1) of the Act, 21 U.S.C. section 360bbb-3(b)(1), unless the authorization is terminated or revoked sooner.     Influenza A by PCR NEGATIVE NEGATIVE Final   Influenza B by PCR NEGATIVE NEGATIVE Final    Comment: (NOTE) The Xpert Xpress SARS-CoV-2/FLU/RSV assay is intended as an aid in  the diagnosis of influenza from Nasopharyngeal swab specimens and  should not be used as a sole basis for treatment. Nasal washings and  aspirates are unacceptable for Xpert Xpress SARS-CoV-2/FLU/RSV  testing.  Fact Sheet for Patients: PinkCheek.be  Fact Sheet for Healthcare Providers: GravelBags.it  This test is not yet approved or cleared by the [place] FDA and  has been authorized for detection and/or diagnosis of SARS-CoV-2 by  FDA under  an Emergency Use Authorization (EUA). This EUA will remain  in effect (meaning this test can be  used) for the duration of the  Covid-19 declaration under Section 564(b)(1) of the Act, 21  U.S.C. section 360bbb-3(b)(1), unless the authorization is  terminated or revoked. Performed at [facility], [address], [place], [place] [address]   Culture, blood (Routine X 2) w Reflex to ID Panel     Status: None (Preliminary result)   Collection Time: 09/23/20 11:23 AM   Specimen: BLOOD RIGHT ARM  Result Value Ref Range Status   Specimen Description   Final    BLOOD RIGHT ARM Performed at [place] [address], [place], [place] [address]    Special Requests   Final    BOTTLES DRAWN AEROBIC AND ANAEROBIC Blood Culture adequate volume Performed at [place] [address], [place], [place] [address]    Culture   Final    NO GROWTH < 12 HOURS Performed at [place] [address], [place], [place] [address]    Report Status PENDING  Incomplete  Culture, blood (Routine X 2) w Reflex to ID Panel     Status: None (Preliminary result)   Collection Time: 09/23/20 11:24 AM   Specimen: BLOOD RIGHT ARM  Result Value Ref Range Status   Specimen Description   Final    BLOOD RIGHT ARM Performed at [facility], [place] [address], [place], [place] [address]    Special Requests   Final    BOTTLES DRAWN AEROBIC AND ANAEROBIC Blood Culture adequate volume Performed at [place] [address], [place], [place] [address]    Culture   Final    NO GROWTH < 12 HOURS Performed at [place] [address] [place], [place] [address]    Report Status PENDING  Incomplete         Radiology Studies: CT ABDOMEN PELVIS WO CONTRAST  Result Date: 09/22/2020 CLINICAL DATA:  Constipation with vomiting. History of colitis and contrast allergy. Suspected bowel obstruction. EXAM: CT ABDOMEN AND PELVIS WITHOUT CONTRAST TECHNIQUE:  Multidetector CT imaging of the abdomen and pelvis was performed following the standard protocol without IV contrast. COMPARISON:  Renal ultrasound 11/01/2018 FINDINGS: Lower chest: Linear scarring or atelectasis at the left lung base. Patient has a pacemaker. There is no significant pleural or pericardial effusion. Aortic and coronary artery atherosclerosis noted. Hepatobiliary: There is moderate intrahepatic and extrahepatic biliary dilatation post cholecystectomy. The common hepatic duct measures up to 18 mm in diameter. No evidence of calcified choledocholithiasis. The liver otherwise appears unremarkable as imaged in the noncontrast state. Pancreas: Mildly atrophied. No evidence of pancreatic ductal dilatation, mass lesion or surrounding inflammation. Spleen: Normal in size without focal abnormality. Adrenals/Urinary Tract: Both adrenal glands appear normal. Both kidneys are within normal limits for size. There are innumerable low-density renal lesions bilaterally which are incompletely characterized without contrast, but likely cysts. Some of these are associated with thin septations. No evidence of hydronephrosis, perinephric soft tissue stranding or ureteral calculus. The bladder appears normal. Stomach/Bowel: A small amount of enteric contrast was administered. The stomach appears normal. There is no small or large bowel distension, wall thickening or surrounding inflammatory change. Some high density material is present within the sigmoid colon. The appendix is not clearly visualized. There is no pericecal inflammation. Vascular/Lymphatic: There are no enlarged abdominal or pelvic lymph nodes. Aortic and branch vessel atherosclerosis without evidence of aneurysm. Reproductive: Hysterectomy.  No adnexal mass. Other: Postsurgical changes in the low anterior abdominal wall. No evidence of hernia  or ascites. Musculoskeletal: No acute or significant osseous findings. Mild degenerative changes in the lumbar  spine with associated probable nonacute Schmorl's node formation in the inferior endplate of L3. IMPRESSION: 1. No acute findings or explanation for the patient's symptoms. No evidence of bowel obstruction or perforation. 2. Intrahepatic and extrahepatic biliary dilatation post cholecystectomy, likely physiologic. Correlation with liver function studies recommended. 3. Innumerable low-density renal lesions bilaterally, incompletely characterized without contrast, but likely cysts as correlated with prior ultrasound. Given history renal failure, findings suggest polycystic kidney disease. 4. Aortic Atherosclerosis (ICD10-I70.0). Electronically Signed   By: Richardean Sale M.D.   On: 09/22/2020 13:40   DG Chest 2 View  Result Date: 09/23/2020 CLINICAL DATA:  Chest pain EXAM: CHEST - 2 VIEW COMPARISON:  November 01, 2018 FINDINGS: Lungs are clear. Heart size and pulmonary vascularity are normal. No adenopathy. Pacemaker leads are attached to the right atrium and right ventricle. No pneumothorax. No bone lesions. IMPRESSION: Pacemaker leads attached to right atrium and right ventricle. Heart size normal. Lungs clear. Electronically Signed   By: Lowella Grip III M.D.   On: 09/23/2020 11:34        Scheduled Meds: . amLODipine  10 mg Oral Daily  . buPROPion  150 mg Oral Daily  . carvedilol  6.25 mg Oral BID  . escitalopram  20 mg Oral Daily  . feeding supplement  1 Container Oral TID BM  . gabapentin  100 mg Oral TID  . hydrALAZINE  25 mg Oral Daily  . insulin aspart  0-15 Units Subcutaneous TID WC  . insulin aspart  0-5 Units Subcutaneous QHS  . insulin glargine  5 Units Subcutaneous Daily  . lisinopril  20 mg Oral Daily  . metoCLOPramide (REGLAN) injection  5 mg Intravenous Q6H  . oxybutynin  10 mg Oral QHS  . pantoprazole (PROTONIX) IV  40 mg Intravenous Q24H  . rosuvastatin  5 mg Oral Daily  . sorbitol, milk of mag, mineral oil, glycerin (SMOG) enema  960 mL Rectal Once  . traZODone   100 mg Oral QHS   Continuous Infusions: . sodium chloride 150 mL/hr at 09/24/20 0445     LOS: 1 day    Time spent: 35 minutes    Irine Seal, MD [facility]   To contact the attending provider between 7A-7P or the covering provider during after hours 7P-7A, please log into the web site [email or web address] and access using universal [place] password for that web site. If you do not have the password, please call the hospital operator.  09/24/2020, 12:02 PM

## 2020-09-24 NOTE — Plan of Care (Signed)

## 2020-09-25 ENCOUNTER — Inpatient Hospital Stay (HOSPITAL_COMMUNITY): Payer: Medicare Other

## 2020-09-25 DIAGNOSIS — R112 Nausea with vomiting, unspecified: Secondary | ICD-10-CM | POA: Diagnosis not present

## 2020-09-25 DIAGNOSIS — I5032 Chronic diastolic (congestive) heart failure: Secondary | ICD-10-CM | POA: Diagnosis not present

## 2020-09-25 DIAGNOSIS — E210 Primary hyperparathyroidism: Principal | ICD-10-CM

## 2020-09-25 DIAGNOSIS — N1832 Chronic kidney disease, stage 3b: Secondary | ICD-10-CM | POA: Diagnosis not present

## 2020-09-25 LAB — CBC
HCT: 40.2 % (ref 36.0–46.0)
Hemoglobin: 14.1 g/dL (ref 12.0–15.0)
MCH: 31.5 pg (ref 26.0–34.0)
MCHC: 35.1 g/dL (ref 30.0–36.0)
MCV: 89.7 fL (ref 80.0–100.0)
Platelets: 166 10*3/uL (ref 150–400)
RBC: 4.48 MIL/uL (ref 3.87–5.11)
RDW: 12.1 % (ref 11.5–15.5)
WBC: 13.3 10*3/uL — ABNORMAL HIGH (ref 4.0–10.5)
nRBC: 0 % (ref 0.0–0.2)

## 2020-09-25 LAB — VITAMIN D 25 HYDROXY (VIT D DEFICIENCY, FRACTURES): Vit D, 25-Hydroxy: 20.14 ng/mL — ABNORMAL LOW (ref 30–100)

## 2020-09-25 LAB — MAGNESIUM: Magnesium: 2.1 mg/dL (ref 1.7–2.4)

## 2020-09-25 LAB — RENAL FUNCTION PANEL
Albumin: 3.4 g/dL — ABNORMAL LOW (ref 3.5–5.0)
Anion gap: 10 (ref 5–15)
BUN: 33 mg/dL — ABNORMAL HIGH (ref 8–23)
CO2: 22 mmol/L (ref 22–32)
Calcium: 10.6 mg/dL — ABNORMAL HIGH (ref 8.9–10.3)
Chloride: 103 mmol/L (ref 98–111)
Creatinine, Ser: 2.09 mg/dL — ABNORMAL HIGH (ref 0.44–1.00)
GFR, Estimated: 24 mL/min — ABNORMAL LOW (ref >60)
Glucose, Bld: 112 mg/dL — ABNORMAL HIGH (ref 70–99)
Phosphorus: 2.8 mg/dL (ref 2.5–4.6)
Potassium: 3.2 mmol/L — ABNORMAL LOW (ref 3.5–5.1)
Sodium: 135 mmol/L (ref 135–145)

## 2020-09-25 LAB — HEMOGLOBIN A1C
Hgb A1c MFr Bld: 6.4 % — ABNORMAL HIGH (ref 4.8–5.6)
Hgb A1c MFr Bld: 6.5 % — ABNORMAL HIGH (ref 4.8–5.6)
Mean Plasma Glucose: 137 mg/dL
Mean Plasma Glucose: 140 mg/dL

## 2020-09-25 LAB — GLUCOSE, CAPILLARY
Glucose-Capillary: 113 mg/dL — ABNORMAL HIGH (ref 70–99)
Glucose-Capillary: 190 mg/dL — ABNORMAL HIGH (ref 70–99)
Glucose-Capillary: 88 mg/dL (ref 70–99)
Glucose-Capillary: 191 mg/dL — ABNORMAL HIGH (ref 70–99)

## 2020-09-25 MED ORDER — ADULT MULTIVITAMIN W/MINERALS CH
1.0000 | ORAL_TABLET | Freq: Every day | ORAL | Status: DC
  Administered 2020-09-25 – 2020-09-29 (×5): 1 via ORAL
  Filled 2020-09-25 (×5): qty 1

## 2020-09-25 MED ORDER — POTASSIUM CHLORIDE CRYS ER 20 MEQ PO TBCR
40.0000 meq | EXTENDED_RELEASE_TABLET | Freq: Once | ORAL | Status: AC
  Administered 2020-09-25: 40 meq via ORAL
  Filled 2020-09-25: qty 2

## 2020-09-25 MED ORDER — METOCLOPRAMIDE HCL 5 MG PO TABS
5.0000 mg | ORAL_TABLET | Freq: Three times a day (TID) | ORAL | Status: DC
  Administered 2020-09-25 – 2020-09-26 (×3): 5 mg via ORAL
  Filled 2020-09-25 (×2): qty 1

## 2020-09-25 MED ORDER — PANTOPRAZOLE SODIUM 40 MG PO TBEC
40.0000 mg | DELAYED_RELEASE_TABLET | Freq: Every day | ORAL | Status: DC
  Administered 2020-09-26 – 2020-09-27 (×2): 40 mg via ORAL
  Filled 2020-09-25 (×2): qty 1

## 2020-09-25 MED ORDER — TECHNETIUM TC 99M SESTAMIBI - CARDIOLITE
25.0000 | Freq: Once | INTRAVENOUS | Status: AC | PRN
  Administered 2020-09-25: 11:00:00 24 via INTRAVENOUS

## 2020-09-25 MED ORDER — POLYETHYLENE GLYCOL 3350 17 G PO PACK
17.0000 g | PACK | Freq: Every day | ORAL | Status: DC
  Administered 2020-09-25 – 2020-09-26 (×2): 17 g via ORAL
  Filled 2020-09-25 (×2): qty 1

## 2020-09-25 MED ORDER — PROSOURCE PLUS PO LIQD
30.0000 mL | Freq: Every day | ORAL | Status: DC
  Administered 2020-09-28: 30 mL via ORAL
  Filled 2020-09-25: qty 30

## 2020-09-25 NOTE — Plan of Care (Signed)
°  Problem: Education: °Goal: Knowledge of General Education information will improve °Description: Including pain rating scale, medication(s)/side effects and non-pharmacologic comfort measures °Outcome: Progressing °  °Problem: Health Behavior/Discharge Planning: °Goal: Ability to manage health-related needs will improve °Outcome: Progressing °  °Problem: Clinical Measurements: °Goal: Ability to maintain clinical measurements within normal limits will improve °Outcome: Progressing °Goal: Will remain free from infection °Outcome: Progressing °Goal: Diagnostic test results will improve °Outcome: Progressing °  °Problem: Activity: °Goal: Risk for activity intolerance will decrease °Outcome: Progressing °  °Problem: Nutrition: °Goal: Adequate nutrition will be maintained °Outcome: Progressing °  °Problem: Coping: °Goal: Level of anxiety will decrease °Outcome: Progressing °  °Problem: Pain Managment: °Goal: General experience of comfort will improve °Outcome: Progressing °  °Problem: Safety: °Goal: Ability to remain free from injury will improve °Outcome: Progressing °  °Problem: Skin Integrity: °Goal: Risk for impaired skin integrity will decrease °Outcome: Progressing °  °

## 2020-09-25 NOTE — Evaluation (Signed)
Physical Therapy Evaluation Patient Details Name: Melissa Lopez MRN: [id number] DOB: 1951-10-04 Today's Date: 09/25/2020   History of Present Illness  69 y.o. female with medical history significant of insulin-dependent diabetes, hypertension, chronic diastolic congestive heart failure, symptomatic bradycardia status post pacemaker, chronic kidney disease stage IIIb, presents emergency room with complaints of abdominal pain and vomiting--Likely secondary to hypercalcemia and probable constipation, Hypercalcemia-concern may be secondary to dehydration, Hypokalemia-secondary to GI losses  Clinical Impression  On eval, pt was Min guard-Min assist for mobility. She walked ~115 feet with a RW. Intermittent unsteadiness. Pt tolerated activity well. Pt could potentially benefit from HHPT f/u if she is agreeable. Will plan to follow and progress activity as tolerated.      Follow Up Recommendations Home health PT    Equipment Recommendations  None recommended by PT    Recommendations for Other Services       Precautions / Restrictions Precautions Precautions: Fall Restrictions Weight Bearing Restrictions: No      Mobility  Bed Mobility Overal bed mobility: Modified Independent                Transfers Overall transfer level: Needs assistance Equipment used: Rolling walker (2 wheeled) Transfers: Sit to/from Stand Sit to Stand: Supervision         General transfer comment: Cues for hand placement.  Ambulation/Gait Ambulation/Gait assistance: Min assist;Min guard Gait Distance (Feet): 115 Feet Assistive device: Rolling walker (2 wheeled) Gait Pattern/deviations: Step-through pattern;Decreased stride length     General Gait Details: Intermittent assist to steady. Pt denie dizziness. Cues for safety.  Stairs            Wheelchair Mobility    Modified Rankin (Stroke Patients Only)       Balance Overall balance assessment: Needs assistance          Standing balance support: Bilateral upper extremity supported Standing balance-Leahy Scale: Fair                               Pertinent Vitals/Pain Pain Assessment: Faces Faces Pain Scale: Hurts little more Pain Location: stomach Pain Descriptors / Indicators: Discomfort;Sore Pain Intervention(s): Monitored during session;Limited activity within patient's tolerance;Repositioned    Home Living Family/patient expects to be discharged to:: Private residence Living Arrangements: Children Available Help at Discharge: Family;Available 24 hours/day Type of Home: House Home Access: Stairs to enter Entrance Stairs-Rails: Right Entrance Stairs-Number of Steps: 3 Home Layout: Multi-level;Able to live on main level with bedroom/bathroom Home Equipment: Grab bars - tub/shower;Shower seat;Walker - 2 wheels;Cane - single point;Wheelchair - manual      Prior Function Level of Independence: Independent         Comments: holds onto furniture when she walks in the house, uses RW or W/C when she goes out of house. Says she doesn't want to use RW or SPC in house because she is vain, but she will use DME when out and about. Reports that the last time she had HH therapies they got her to walking better     Hand Dominance   Dominant Hand: Right    Extremity/Trunk Assessment   Upper Extremity Assessment Upper Extremity Assessment: Overall WFL for tasks assessed    Lower Extremity Assessment Lower Extremity Assessment: Generalized weakness    Cervical / Trunk Assessment Cervical / Trunk Assessment: Normal  Communication   Communication: No difficulties  Cognition Arousal/Alertness: Awake/alert Behavior During Therapy: WFL for tasks assessed/performed Overall Cognitive Status: Within  Functional Limits for tasks assessed                                        General Comments      Exercises     Assessment/Plan    PT Assessment Patient needs  continued PT services  PT Problem List Decreased strength;Decreased mobility;Decreased balance;Decreased knowledge of use of DME       PT Treatment Interventions DME instruction;Gait training;Therapeutic activities;Therapeutic exercise;Patient/family education;Balance training;Functional mobility training    PT Goals (Current goals can be found in the Care Plan section)  Acute Rehab PT Goals Patient Stated Goal: to be out of pain and get home to her grandbabies PT Goal Formulation: With patient Time For Goal Achievement: 10/09/20 Potential to Achieve Goals: Good    Frequency Min 3X/week   Barriers to discharge        Co-evaluation               AM-PAC PT "6 Clicks" Mobility  Outcome Measure Help needed turning from your back to your side while in a flat bed without using bedrails?: A Little Help needed moving from lying on your back to sitting on the side of a flat bed without using bedrails?: A Little Help needed moving to and from a bed to a chair (including a wheelchair)?: A Little Help needed standing up from a chair using your arms (e.g., wheelchair or bedside chair)?: A Little Help needed to walk in hospital room?: A Little Help needed climbing 3-5 steps with a railing? : A Little 6 Click Score: 18    End of Session Equipment Utilized During Treatment: Gait belt Activity Tolerance: Patient tolerated treatment well Patient left: in bed;with call bell/phone within reach;with bed alarm set   PT Visit Diagnosis: Unsteadiness on feet (R26.81);Muscle weakness (generalized) (M62.81)    Time: 0349-1791 PT Time Calculation (min) (ACUTE ONLY): 10 min   Charges:   PT Evaluation $PT Eval Low Complexity: [place], PT Acute Rehabilitation  Office: [phone] Pager: [phone]

## 2020-09-25 NOTE — Progress Notes (Signed)
PROGRESS NOTE    Melissa Lopez  LDJ:[id number] DOB: 04/16/1951 DOA: 09/22/2020 PCP: Celene Squibb, MD    Chief Complaint  Patient presents with  . Hematemesis    Brief Narrative:  HPI per Dr. Garfield Cornea Homewood is a 69 y.o. female with medical history significant of insulin-dependent diabetes, hypertension, chronic diastolic congestive heart failure, symptomatic bradycardia status post pacemaker, chronic kidney disease stage IIIb, presents emergency room with complaints of abdominal pain and vomiting.  Patient reports onset of symptoms occurring approximate 1 week ago.  She experienced lower abdominal pain which was nonradiating.  Over the past 3 days, she had persistent vomiting.  She is unable to keep anything down.  Symptoms get worse after she tries to eat.  She is not been having any diarrhea.  She is not had any fever, cough, shortness of breath, dysuria.  She has not been started on any new medications.  She is feeling dizzy and lightheaded and weak.  ED Course: Upon arrival to the emergency room, she was noted to be hypertensive, mainly because she has not been able to take her medications.  Lab work shows that she is severely hypercalcemic with a serum calcium greater than 13.  She is hypokalemic with a potassium of 3.1.  She has a leukocytosis of 24,000 as well as elevated hemoglobin of 17.  Creatinine is 1.5 which is near her baseline.  She was started on IV fluids for dehydration.  CT scan of the abdomen pelvis did not show any acute process.  She has been referred for admission  Assessment & Plan:   Principal Problem:   Hyperparathyroidism, primary ([place]) Active Problems:   Type 2 diabetes mellitus with complication, with long-term current use of insulin (HCC)   CKD (chronic kidney disease) stage 3, GFR 30-59 ml/min (HCC) - per patient report   Essential hypertension   Nausea with vomiting   Hypercalcemia   Hypokalemia   Chronic diastolic CHF (congestive heart  failure) (HCC)   Emesis   Hypophosphatemia    1.  Primary hyperparathyroidism /hypercalcemia secondary to primary hyperparathyroidism Likely secondary to primary hyperparathyroidism.  Patient also noted to be dehydrated on admission.  Patient however with chronic kidney disease stage III.  Phosphorus levels decreased.  PTH elevated at 85, intact calcium at 13.5.  Likely secondary to primary hyperparathyroidism.  TSH within normal limits.  Check a vitamin D level 25-hydroxy.  Check a parathyroid scan.  Calcium levels improved with hydration.  Corrected calcium at 11.08.  Increase IV fluid rate to 125 cc/h for the next 24 hours.  Patient states was evaluated in [place] in the past and noted to have a small nodule on her parathyroid gland and surveillance recommended.  Patient improving clinically.  Will likely need outpatient follow-up with general surgery versus PCP.  Follow.   2 nausea/ vomiting/ abdominal pain Likely secondary to hypercalcemia and constipation.  Patient stated having had a bowel movement about 4- 5 days prior to admission.  CT abdomen and pelvis done with no acute abnormalities noted.  Patient noted to be chronically on Reglan however due to nausea and vomiting has not been able to take those regularly.  Concern for possible gastroparesis.  Diet advanced to a soft diet which patient is tolerating.  Will place on a renal diet.  Patient given sorbitol and soapsuds enema with good results for constipation.  Change IV Reglan to oral Reglan.  Continue PPI.  Placed on a bowel regimen of MiraLAX and Senokot-S.  Supportive care.  Outpatient follow-up.   3.  Hypophosphatemia Repleted.  Follow.   4.  Hypokalemia Secondary to GI losses.  Potassium at 3.2.  Replete.  Follow.  5.  Chronic kidney disease stage IIIb At baseline.  Follow.  6.  Hypertension Blood pressure improved.  Continue home regimen of lisinopril, Norvasc, hydralazine.  Status post IV Lasix secondary to hypercalcemia.   Follow.   7.  Well-controlled diabetes mellitus type 2 Last hemoglobin A1c in the system 7.0 (10/31/2018 ) repeat hemoglobin A1c 6.4.  CBG 113 this morning.  Continue current regimen of Lantus 5 units daily, sliding scale insulin.  Change IV Reglan to oral Reglan.  Continue Neurontin.  Follow.  8.  Chronic diastolic CHF/history of symptomatic bradycardia status post pacemaker Compensated.  Patient being aggressively hydrated with IV fluids due to hypercalcemia.  Status post Lasix 40 mg IV x1 secondary to hypercalcemia with a urine output not recorded. Continue cardiac regimen of lisinopril, Coreg, hydralazine, Norvasc.  Monitor volume status with hydration.  Follow.  9.  Constipation Patient given oral sorbitol, soapsuds enema.  Smog enema was ordered however overnight patient noted to have significant amount of stools and as such smog enema not given.  Continue Senokot-S twice daily.  Placed on MiraLAX daily.  Outpatient follow-up.     DVT prophylaxis: SCDs Code Status: Full Family Communication: Updated patient.  No family at bedside. Disposition:   Status is: Inpatient    Dispo: The patient is from: Home              Anticipated d/c is to: Home              Anticipated d/c date is: Hopefully tomorrow              Patient currently on IV fluids, hypercalcemic, hypomagnesemic, constipated.  Not stable for discharge.       Consultants:   None  Procedures:   CT abdomen and pelvis 09/22/2020  Chest x-ray 09/23/2020    Antimicrobials:   None   Subjective: Patient laying in bed.  Denies any further nausea or emesis.  Denies any chest pain.  No shortness of breath.  Feeling better.  Tolerating current diet.   Objective: Vitals:   09/24/20 1354 09/24/20 2048 09/25/20 0500 09/25/20 0531  BP: 98/65 120/80  (!) 105/59  Pulse: 64 70  (!) 59  Resp: 18 18  18   Temp: 98.3 F (36.8 C) 98.2 F (36.8 C)  98.1 F (36.7 C)  TempSrc: Oral Oral    SpO2: 94%   99%  Weight:    73.8 kg   Height:        Intake/Output Summary (Last 24 hours) at 09/25/2020 1238 Last data filed at 09/25/2020 1000 Gross per 24 hour  Intake 1434.48 ml  Output --  Net 1434.48 ml   Filed Weights   09/22/20 1057 09/24/20 1307 09/25/20 0500  Weight: 75.8 kg 79.9 kg 73.8 kg    Examination:  General exam: NAD.  Respiratory system: Some decreased breath sounds in the bases otherwise clear.  No wheezing.  No rhonchi.  Normal respiratory effort.  Speaking in full sentences.   Cardiovascular system: RRR no murmurs rubs or gallops.  No JVD.  No lower extremity edema.    Gastrointestinal system: Abdomen is soft, nontender, nondistended, positive bowel sounds.  No rebound.  No guarding. Central nervous system: Alert and oriented. No focal neurological deficits. Extremities: Symmetric 5 x 5 power. Skin: No rashes, lesions or ulcers  Psychiatry: Judgement and insight appear normal. Mood & affect appropriate.     Data Reviewed: I have personally reviewed following labs and imaging studies  CBC: Recent Labs  Lab 09/22/20 1228 09/24/20 0658 09/25/20 0515  WBC 24.4* 15.6* 13.3*  NEUTROABS 21.3* 11.1*  --   HGB 17.1* 13.8 14.1  HCT 47.6* 39.8 40.2  MCV 86.2 89.4 89.7  PLT 273 188 295    Basic Metabolic Panel: Recent Labs  Lab 09/22/20 1228 09/22/20 2032 09/23/20 1123 09/24/20 0644 09/24/20 0658 09/25/20 0515  NA 135  --  138 131*  --  135  K 3.1*  --  3.8 3.5  --  3.2*  CL 101  --  101 102  --  103  CO2 21*  --  22 20*  --  22  GLUCOSE 227*  --  173* 108*  --  112*  BUN 28*  --  31* 30*  --  33*  CREATININE 1.56*  --  1.79* 1.88*  --  2.09*  CALCIUM 13.5* 13.5* 13.1* 10.4*  --  10.6*  MG  --  2.2 2.1  --  2.0 2.1  PHOS  --   --  1.3* 2.9  --  2.8    GFR: Estimated Creatinine Clearance: 25.3 mL/min (A) (by C-G formula based on SCr of 2.09 mg/dL (H)).  Liver Function Tests: Recent Labs  Lab 09/22/20 1228 09/24/20 0644 09/25/20 0515  AST 17  --   --   ALT 16   --   --   ALKPHOS 122  --   --   BILITOT 1.0  --   --   PROT 8.5*  --   --   ALBUMIN 4.9 3.4* 3.4*    CBG: Recent Labs  Lab 09/24/20 1217 09/24/20 1647 09/24/20 2140 09/25/20 0736 09/25/20 1234  GLUCAP 159* 79 155* 113* 190*     Recent Results (from the past 240 hour(s))  Respiratory Panel by RT PCR (Flu A&B, Covid) - Nasopharyngeal Swab     Status: None   Collection Time: 09/22/20  8:25 PM   Specimen: Nasopharyngeal Swab  Result Value Ref Range Status   SARS Coronavirus 2 by RT PCR NEGATIVE NEGATIVE Final    Comment: (NOTE) SARS-CoV-2 target nucleic acids are NOT DETECTED.  The SARS-CoV-2 RNA is generally detectable in upper respiratoy specimens during the acute phase of infection. The lowest concentration of SARS-CoV-2 viral copies this assay can detect is 131 copies/mL. A negative result does not preclude SARS-Cov-2 infection and should not be used as the sole basis for treatment or other patient management decisions. A negative result may occur with  improper specimen collection/handling, submission of specimen other than nasopharyngeal swab, presence of viral mutation(s) within the areas targeted by this assay, and inadequate number of viral copies (<131 copies/mL). A negative result must be combined with clinical observations, patient history, and epidemiological information. The expected result is Negative.  Fact Sheet for Patients:  PinkCheek.be  Fact Sheet for Healthcare Providers:  GravelBags.it  This test is no t yet approved or cleared by the [place] FDA and  has been authorized for detection and/or diagnosis of SARS-CoV-2 by FDA under an Emergency Use Authorization (EUA). This EUA will remain  in effect (meaning this test can be used) for the duration of the COVID-19 declaration under Section 564(b)(1) of the Act, 21 U.S.C. section 360bbb-3(b)(1), unless the authorization is terminated  or revoked sooner.     Influenza A by PCR NEGATIVE NEGATIVE  Final   Influenza B by PCR NEGATIVE NEGATIVE Final    Comment: (NOTE) The Xpert Xpress SARS-CoV-2/FLU/RSV assay is intended as an aid in  the diagnosis of influenza from Nasopharyngeal swab specimens and  should not be used as a sole basis for treatment. Nasal washings and  aspirates are unacceptable for Xpert Xpress SARS-CoV-2/FLU/RSV  testing.  Fact Sheet for Patients: PinkCheek.be  Fact Sheet for Healthcare Providers: GravelBags.it  This test is not yet approved or cleared by the [place] FDA and  has been authorized for detection and/or diagnosis of SARS-CoV-2 by  FDA under an Emergency Use Authorization (EUA). This EUA will remain  in effect (meaning this test can be used) for the duration of the  Covid-19 declaration under Section 564(b)(1) of the Act, 21  U.S.C. section 360bbb-3(b)(1), unless the authorization is  terminated or revoked. Performed at [facility], [address], [place], [place] [address]   Culture, Urine     Status: Abnormal   Collection Time: 09/23/20  8:32 AM   Specimen: Urine, Catheterized  Result Value Ref Range Status   Specimen Description   Final    URINE, CATHETERIZED Performed at [facility] [address] [place], [place] [address]    Special Requests   Final    NONE Performed at [facility], [place] [address], [place], [place] [address]    Culture (A)  Final    30,000 COLONIES/mL MULTIPLE SPECIES PRESENT, SUGGEST RECOLLECTION   Report Status 09/24/2020 FINAL  Final  Culture, blood (Routine X 2) w Reflex to ID Panel     Status: None (Preliminary result)   Collection Time: 09/23/20 11:23 AM   Specimen: BLOOD RIGHT ARM  Result Value Ref Range Status   Specimen Description   Final    BLOOD RIGHT ARM Performed at [facility], [place] [address] [place], [place] [address]     Special Requests   Final    BOTTLES DRAWN AEROBIC AND ANAEROBIC Blood Culture adequate volume Performed at [facility] [address] [place], [place] [address]    Culture   Final    NO GROWTH 2 DAYS Performed at [facility] [address] [place], [place] [address]    Report Status PENDING  Incomplete  Culture, blood (Routine X 2) w Reflex to ID Panel     Status: None (Preliminary result)   Collection Time: 09/23/20 11:24 AM   Specimen: BLOOD RIGHT ARM  Result Value Ref Range Status   Specimen Description   Final    BLOOD RIGHT ARM Performed at [facility], [place] [address] [place], [place] [address]    Special Requests   Final    BOTTLES DRAWN AEROBIC AND ANAEROBIC Blood Culture adequate volume Performed at [facility] [address] [place], [place] [address]    Culture   Final    NO GROWTH 2 DAYS Performed at [facility] [address], [place],  [address]    Report Status PENDING  Incomplete         Radiology Studies: No results found.      Scheduled Meds: . amLODipine  10 mg Oral Daily  . buPROPion  150 mg Oral Daily  . carvedilol  6.25 mg Oral BID  . escitalopram  20 mg Oral Daily  . feeding supplement  1 Container Oral TID BM  . gabapentin  100 mg Oral TID  . hydrALAZINE  25 mg Oral Daily  . insulin aspart  0-15 Units Subcutaneous TID WC  . insulin aspart  0-5 Units Subcutaneous QHS  . insulin glargine  5 Units Subcutaneous Daily  . lisinopril  20 mg Oral Daily  . metoCLOPramide (REGLAN) injection  5 mg Intravenous Q6H  . oxybutynin  10 mg Oral QHS  . pantoprazole (PROTONIX) IV  40 mg Intravenous Q24H  . polyethylene glycol  17 g Oral Daily  . rosuvastatin  5 mg Oral Daily  . senna-docusate  1 tablet Oral BID  . sorbitol, milk of mag, mineral oil, glycerin (SMOG) enema  960 mL Rectal Once  . traZODone  100 mg Oral QHS   Continuous Infusions: . sodium chloride 125 mL/hr at  09/25/20 1059     LOS: 2 days    Time spent: 35 minutes    Irine Seal, MD [facility]   To contact the attending provider between 7A-7P or the covering provider during after hours 7P-7A, please log into the web site [email or web address] and access using universal [place] password for that web site. If you do not have the password, please call the hospital operator.  09/25/2020, 12:38 PM

## 2020-09-25 NOTE — Progress Notes (Signed)
Initial Nutrition Assessment  DOCUMENTATION CODES:   Not applicable  INTERVENTION:  - continue Boost Breeze TID, each supplement provides 250 kcal and 9 grams of protein. - will order 30 ml Prosource Plus once/day, each supplement provides 100 kcal and 15 grams protein.  - will order 1 tablet multivitamin with minerals/day. - will complete NFPE at follow-up.   NUTRITION DIAGNOSIS:   Increased nutrient needs related to acute illness as evidenced by estimated needs.  GOAL:   Patient will meet greater than or equal to 90% of their needs  MONITOR:   PO intake, Supplement acceptance, Labs, Weight trends  REASON FOR ASSESSMENT:   Malnutrition Screening Tool  ASSESSMENT:   69 y.o. female with medical history of DM, HTN, CHF, symptomatic bradycardia s/p pacemaker, and stage 3 CKD. She presented to the ED with abdominal pain and vomiting x1 week with vomiting becoming persistent and inability to keep anything down x3 days PTA. She was also feeling dizzy, lightheaded, and weak.  Patient out of the room to Nuclear Med while RD was on the floor.   Diet advanced from CLD to [place] on 10/17 at 1225 and to Soft on 10/17 at 1615. She ate 50% of dinner on 10/16, 40% of breakfast on 10/17, and 100% of breakfast this AM. Breakfast today provided 350 kcal and 2 grams protein.   Boost Breeze was ordered TID and she has accepted 3 of the 5 cartons offered to her.  Weight today is 163 lb and PTA the most recently documented weight was on 9/3 when she weighed 167 lb. This indicates 4 lb weight loss (2.4% body weight) in the past 1.5 months; not significant for time frame but unsure if weight loss occurred more acutely.   Per notes: - N/V and abdominal pain thought to be 2/2 hypercalcemia and constipation - chronically on reglan - hyperparathyroidism   Labs reviewed; CBGs: 113 and 190 mg/dl, K: 3.2 mmol/l, BUN: 33 mg/dl, creatinine: 2.09 mg/dl, Ca: 10.6 mg/dl, GFR: 24 ml/min. Medications reviewed;  sliding scale novolog, 5 units lantus/day, 5 mg IV reglan QID, 40 mg IV protonix/day, 17 g miralax/day, 1 tablet senokot BID, SMOG enema x1 on 10/16. IVF; 1/2 NS @ 125 ml/h.    NUTRITION - FOCUSED PHYSICAL EXAM:  unable to complete at this time.   Diet Order:   Diet Order            DIET SOFT Room service appropriate? Yes; Fluid consistency: Thin  Diet effective now                 EDUCATION NEEDS:   No education needs have been identified at this time  Skin:  Skin Assessment: Reviewed RN Assessment  Last BM:  10/18  Height:   Ht Readings from Last 1 Encounters:  09/22/20 5\' 4"  (1.626 m)    Weight:   Wt Readings from Last 1 Encounters:  09/25/20 73.8 kg    Estimated Nutritional Needs:  Kcal:  1700-1900 kcal Protein:  85-95 grams Fluid:  >/= 2.3 L/day     Jarome Matin, MS, RD, LDN, CNSC Inpatient Clinical Dietitian RD pager # available in AMION  After hours/weekend pager # available in [facility]

## 2020-09-25 NOTE — Evaluation (Signed)
Occupational Therapy Evaluation and Discharge Patient Details Name: Melissa Lopez MRN: [id number] DOB: [birth year] Today's Date: 09/25/2020    History of Present Illness Melissa Lopez is a 69 y.o. female with medical history significant of insulin-dependent diabetes, hypertension, chronic diastolic congestive heart failure, symptomatic bradycardia status post pacemaker, chronic kidney disease stage IIIb, presents emergency room with complaints of abdominal pain and vomiting--Likely secondary to hypercalcemia and probable constipation, Hypercalcemia-concern may be secondary to dehydration, Hypokalemia-secondary to GI losses   Clinical Impression   This 69 yo female admitted with above presents to acute OT with PLOF of being totally independent with basic ADLs and family doing all of the IADLs. Currently she is min guard A when up on her feet with "furniture walking" as she reports she does at home. Feel she would benefit from [facility], acute OT will sign off.     Follow Up Recommendations  Home health OT;Supervision/Assistance - 24 hour    Equipment Recommendations  None recommended by OT       Precautions / Restrictions Precautions Precautions: Fall Restrictions Weight Bearing Restrictions: No      Mobility Bed Mobility Overal bed mobility: Independent                Transfers Overall transfer level: Needs assistance Equipment used: None Transfers: Sit to/from Stand Sit to Stand: Min guard         General transfer comment: min guard to ambulate to bathroom "furniture walked"--this is what she reports she does at home    Balance Overall balance assessment: Needs assistance Sitting-balance support: No upper extremity supported;Feet supported Sitting balance-Leahy Scale: Good     Standing balance support: No upper extremity supported Standing balance-Leahy Scale: Fair                             ADL either performed or assessed with clinical  judgement   ADL                                         General ADL Comments: Overall at a min guard A level due to feels like she needs to hold onto something when up and about on her feet (as she does at home). Has someone with her 24/7 at home. Gets down into tub at home for bathing. Feel pt would benefit from [facility] to see if there are ways to make ADLs more safe for her.     Vision Baseline Vision/History: Wears glasses Wears Glasses: At all times Patient Visual Report: No change from baseline              Pertinent Vitals/Pain Pain Assessment: 0-10 Pain Score: 6  Pain Location: stomach Pain Descriptors / Indicators: Sharp (comes and goes) Pain Intervention(s): Limited activity within patient's tolerance;Monitored during session;Repositioned     Hand Dominance Right   Extremity/Trunk Assessment Upper Extremity Assessment Upper Extremity Assessment: Overall WFL for tasks assessed           Communication Communication Communication: No difficulties   Cognition Arousal/Alertness: Awake/alert Behavior During Therapy: WFL for tasks assessed/performed Overall Cognitive Status: Within Functional Limits for tasks assessed  Home Living Family/patient expects to be discharged to:: Private residence Living Arrangements: Children (grand daughter and her husband, 2 grand babies') Available Help at Discharge: Family;Available 24 hours/day Type of Home: House Home Access: Stairs to enter [employer] of Steps: 3 Entrance Stairs-Rails: Right Home Layout: Multi-level;Able to live on main level with bedroom/bathroom     Bathroom Shower/Tub: Tub/shower unit;Curtain   Bathroom Toilet: Standard     Home Equipment: Grab bars - tub/shower;Shower seat;Walker - 2 wheels;Cane - single point;Wheelchair - manual          Prior Functioning/Environment Level of Independence: Independent         Comments: holds onto furniture when she walks in the house, uses RW or W/C when she goes out of house. Says she doesn't want to use RW or SPC in house because she is vain, but she will use DME when out and about. Reports that the last time she had HH therapies they got her to walking better        OT Problem List: Impaired balance (sitting and/or standing);Pain         OT Goals(Current goals can be found in the care plan section) Acute Rehab OT Goals Patient Stated Goal: to be out of pain and get home to her grandbabies  OT Frequency:                AM-PAC OT "6 Clicks" Daily Activity     Outcome Measure Help from another person eating meals?: None Help from another person taking care of personal grooming?: A Little Help from another person toileting, which includes using toliet, bedpan, or urinal?: A Little Help from another person bathing (including washing, rinsing, drying)?: A Little Help from another person to put on and taking off regular upper body clothing?: A Little Help from another person to put on and taking off regular lower body clothing?: A Little 6 Click Score: 19   End of Session Nurse Communication: Patient requests pain meds (pt's IV leaking)  Activity Tolerance: Patient tolerated treatment well Patient left: in chair;with call bell/phone within reach;with chair alarm set  OT Visit Diagnosis: Unsteadiness on feet (R26.81);Other abnormalities of gait and mobility (R26.89);Pain Pain - part of body:  (stomach)                Time: 8185-6314 OT Time Calculation (min): 19 min Charges:  OT General Charges $OT Visit: 1 Visit OT Evaluation $OT Eval Moderate Complexity: 1 Mod  [address], OTR/L Acute [employer] Pager [phone] Office [phone]     Almon Register 09/25/2020, 9:26 AM

## 2020-09-26 DIAGNOSIS — R112 Nausea with vomiting, unspecified: Secondary | ICD-10-CM | POA: Diagnosis not present

## 2020-09-26 DIAGNOSIS — I5032 Chronic diastolic (congestive) heart failure: Secondary | ICD-10-CM | POA: Diagnosis not present

## 2020-09-26 DIAGNOSIS — N1832 Chronic kidney disease, stage 3b: Secondary | ICD-10-CM | POA: Diagnosis not present

## 2020-09-26 LAB — CBC WITH DIFFERENTIAL/PLATELET
Abs Immature Granulocytes: 0.03 10*3/uL (ref 0.00–0.07)
Basophils Absolute: 0.1 10*3/uL (ref 0.0–0.1)
Basophils Relative: 1 %
Eosinophils Absolute: 0.2 10*3/uL (ref 0.0–0.5)
Eosinophils Relative: 2 %
HCT: 39.4 % (ref 36.0–46.0)
Hemoglobin: 13.5 g/dL (ref 12.0–15.0)
Immature Granulocytes: 0 %
Lymphocytes Relative: 27 %
Lymphs Abs: 2.6 10*3/uL (ref 0.7–4.0)
MCH: 31 pg (ref 26.0–34.0)
MCHC: 34.3 g/dL (ref 30.0–36.0)
MCV: 90.4 fL (ref 80.0–100.0)
Monocytes Absolute: 1 10*3/uL (ref 0.1–1.0)
Monocytes Relative: 10 %
Neutro Abs: 6 10*3/uL (ref 1.7–7.7)
Neutrophils Relative %: 60 %
Platelets: 164 10*3/uL (ref 150–400)
RBC: 4.36 MIL/uL (ref 3.87–5.11)
RDW: 12 % (ref 11.5–15.5)
WBC: 9.9 10*3/uL (ref 4.0–10.5)
nRBC: 0 % (ref 0.0–0.2)

## 2020-09-26 LAB — RENAL FUNCTION PANEL
Albumin: 3.1 g/dL — ABNORMAL LOW (ref 3.5–5.0)
Anion gap: 8 (ref 5–15)
BUN: 25 mg/dL — ABNORMAL HIGH (ref 8–23)
CO2: 23 mmol/L (ref 22–32)
Calcium: 10.8 mg/dL — ABNORMAL HIGH (ref 8.9–10.3)
Chloride: 105 mmol/L (ref 98–111)
Creatinine, Ser: 1.99 mg/dL — ABNORMAL HIGH (ref 0.44–1.00)
GFR, Estimated: 25 mL/min — ABNORMAL LOW (ref >60)
Glucose, Bld: 101 mg/dL — ABNORMAL HIGH (ref 70–99)
Phosphorus: 2.4 mg/dL — ABNORMAL LOW (ref 2.5–4.6)
Potassium: 3.7 mmol/L (ref 3.5–5.1)
Sodium: 136 mmol/L (ref 135–145)

## 2020-09-26 LAB — GLUCOSE, CAPILLARY
Glucose-Capillary: 97 mg/dL (ref 70–99)
Glucose-Capillary: 213 mg/dL — ABNORMAL HIGH (ref 70–99)
Glucose-Capillary: 93 mg/dL (ref 70–99)
Glucose-Capillary: 152 mg/dL — ABNORMAL HIGH (ref 70–99)

## 2020-09-26 LAB — MAGNESIUM: Magnesium: 2 mg/dL (ref 1.7–2.4)

## 2020-09-26 MED ORDER — POLYETHYLENE GLYCOL 3350 17 G PO PACK
17.0000 g | PACK | Freq: Two times a day (BID) | ORAL | Status: DC
  Administered 2020-09-27 – 2020-09-29 (×5): 17 g via ORAL
  Filled 2020-09-26 (×5): qty 1

## 2020-09-26 MED ORDER — SORBITOL 70 % SOLN
960.0000 mL | TOPICAL_OIL | Freq: Once | ORAL | Status: AC
  Administered 2020-09-26: 960 mL via RECTAL
  Filled 2020-09-26: qty 473

## 2020-09-26 MED ORDER — METOCLOPRAMIDE HCL 5 MG/ML IJ SOLN
10.0000 mg | Freq: Four times a day (QID) | INTRAMUSCULAR | Status: DC
  Administered 2020-09-26 – 2020-09-28 (×7): 10 mg via INTRAVENOUS
  Filled 2020-09-26 (×7): qty 2

## 2020-09-26 MED ORDER — POTASSIUM PHOSPHATES 15 MMOLE/5ML IV SOLN
30.0000 mmol | Freq: Once | INTRAVENOUS | Status: AC
  Administered 2020-09-26: 30 mmol via INTRAVENOUS
  Filled 2020-09-26: qty 10

## 2020-09-26 NOTE — Progress Notes (Addendum)
PROGRESS NOTE    Melissa Lopez  INO:[id number] DOB: Aug 31, 1951 DOA: 09/22/2020 PCP: Celene Squibb, MD    Chief Complaint  Patient presents with  . Hematemesis    Brief Narrative:  HPI per Dr. Garfield Cornea Scalese is a 69 y.o. female with medical history significant of insulin-dependent diabetes, hypertension, chronic diastolic congestive heart failure, symptomatic bradycardia status post pacemaker, chronic kidney disease stage IIIb, presents emergency room with complaints of abdominal pain and vomiting.  Patient reports onset of symptoms occurring approximate 1 week ago.  She experienced lower abdominal pain which was nonradiating.  Over the past 3 days, she had persistent vomiting.  She is unable to keep anything down.  Symptoms get worse after she tries to eat.  She is not been having any diarrhea.  She is not had any fever, cough, shortness of breath, dysuria.  She has not been started on any new medications.  She is feeling dizzy and lightheaded and weak.  ED Course: Upon arrival to the emergency room, she was noted to be hypertensive, mainly because she has not been able to take her medications.  Lab work shows that she is severely hypercalcemic with a serum calcium greater than 13.  She is hypokalemic with a potassium of 3.1.  She has a leukocytosis of 24,000 as well as elevated hemoglobin of 17.  Creatinine is 1.5 which is near her baseline.  She was started on IV fluids for dehydration.  CT scan of the abdomen pelvis did not show any acute process.  She has been referred for admission  Assessment & Plan:   Principal Problem:   Hyperparathyroidism, primary ([place]) Active Problems:   Type 2 diabetes mellitus with complication, with long-term current use of insulin (HCC)   CKD (chronic kidney disease) stage 3, GFR 30-59 ml/min (HCC) - per patient report   Essential hypertension   Nausea with vomiting   Hypercalcemia   Hypokalemia   Chronic diastolic CHF (congestive heart  failure) (HCC)   Emesis   Hypophosphatemia    1.  Primary hyperparathyroidism /hypercalcemia secondary to primary hyperparathyroidism Likely secondary to primary hyperparathyroidism.  Patient also noted to be dehydrated on admission.  Patient however with chronic kidney disease stage III.  Phosphorus levels decreased.  PTH elevated at 85, intact calcium at 13.5.  Likely secondary to primary hyperparathyroidism.  TSH within normal limits.  Vitamin D 25-hydroxy level within decrease at 20.14.  Patient underwent a parathyroid scan which was negative.  Calcium levels improving from admission.  Corrected calcium at 11.52.  Patient states was evaluated in [place] in the past and noted to have a small nodule on her parathyroid gland and surveillance recommended.  Patient improving clinically.  Outpatient follow-up with PCP, general surgery as calcium levels getting close to baseline.  Follow.   2 nausea/ vomiting/ abdominal pain Likely secondary to hypercalcemia and constipation.  Patient stated had not a bowel movement about 4- 5 days prior to admission.  CT abdomen and pelvis done with no acute abnormalities noted.  Patient noted to be chronically on Reglan however due to nausea and vomiting has not been able to take those regularly.  Concern for possible gastroparesis.  Diet advanced to a renal diet that patient is tolerating.  Patient received sorbitol and soapsuds enema with good results.  Patient however with no bowel movement in the past 24 hours.  Patient had an episode of emesis overnight.  IV Reglan has been transitioned to oral Reglan.  Continue PPI.  Increase  MiraLAX to twice daily.  Continue Senokot-S.  We will give a smog enema today.  Follow.   Addendum 5:02 PM Was notified by nurse tech that patient had a bout of emesis this evening after a few bites of a pot roast.  Will change Reglan back to IV and increase dose to 10 mg every 6 hours.  3.  Hypophosphatemia Phosphorus level at 2.4.   K-Phos 30 mmol IV x1.  Follow.  4.  Hypokalemia Secondary to GI losses.  Repleted.  Potassium currently at 3.7.  Phosphorus level at 2.4.  Magnesium at 2.0.  Follow.   5.  Chronic kidney disease stage IIIb Close to baseline.  Follow.   6.  Hypertension Continue home regimen lisinopril, Norvasc, hydralazine.  Status post IV Lasix secondary to hypercalcemia.  Follow.   7.  Well-controlled diabetes mellitus type 2 Last hemoglobin A1c in the system 7.0 (10/31/2018 ) repeat hemoglobin A1c 6.4.  CBG 97 this morning.  Continue current dose of Lantus 5 units daily, sliding scale insulin.  IV Reglan has been transitioned to oral Reglan.  Continue Neurontin.  Follow.   8.  Chronic diastolic CHF/history of symptomatic bradycardia status post pacemaker Compensated.  Patient being aggressively hydrated with IV fluids due to hypercalcemia.  Status post Lasix 40 mg IV x1 secondary to hypercalcemia with a urine output not recorded. Continue cardiac regimen of lisinopril, Coreg, hydralazine, Norvasc.  Continue hydration for another 24 hours and then saline lock IV fluids.  Follow.  9.  Constipation Patient given oral sorbitol, soapsuds enema.  Smog enema was ordered however overnight patient noted to have significant amount of stools and as such smog enema not given.  Patient with no bowel movement now for 48 hours.  Patient with episode of emesis overnight.  Patient with some abdominal discomfort.  Increase MiraLAX to twice daily.  Continue Senokot-S twice daily. Smog enema today.  Follow.      DVT prophylaxis: SCDs Code Status: Full Family Communication: Updated patient.  No family at bedside. Disposition:   Status is: Inpatient    Dispo: The patient is from: Home              Anticipated d/c is to: Home              Anticipated d/c date is: Hopefully 1 to 2 days.              Patient currently on IV fluids, hypercalcemic, hypomagnesemic, constipated.  Not stable for discharge.         Consultants:   None  Procedures:   CT abdomen and pelvis 09/22/2020  Chest x-ray 09/23/2020    Antimicrobials:   None   Subjective: Patient laying in bed.  Patient does state had episode of emesis last night and does not feel as well as she did yesterday.  Has not had a bowel movement in the past 2 days.  Some abdominal pain.  Tolerated breakfast this morning.   Objective: Vitals:   09/25/20 1241 09/25/20 2129 09/26/20 0528 09/26/20 1254  BP: 119/78 132/76 115/78 135/87  Pulse: 61 61 60 60  Resp: 16 20 20 16   Temp: 98.4 F (36.9 C) 97.9 F (36.6 C) 98.7 F (37.1 C) 97.8 F (36.6 C)  TempSrc: Oral Oral Oral Oral  SpO2: 98% 98% 97% 97%  Weight:      Height:        Intake/Output Summary (Last 24 hours) at 09/26/2020 1257 Last data filed at 09/26/2020 1000 Gross  per 24 hour  Intake 2827.21 ml  Output 300 ml  Net 2527.21 ml   Filed Weights   09/22/20 1057 09/24/20 1307 09/25/20 0500  Weight: 75.8 kg 79.9 kg 73.8 kg    Examination:  General exam: NAD.  Respiratory system: Lungs clear to auscultation bilaterally.  No wheezes, no crackles, no rhonchi.  Normal respiratory effort.  Speaking in full sentences.  Cardiovascular system: Regular rate rhythm no murmurs rubs or gallops.  No JVD.  No lower extremity edema. Gastrointestinal system: Abdomen is soft, some tenderness to palpation in the left lower quadrant, positive bowel sounds, no rebound, no guarding.  Central nervous system: Alert and oriented. No focal neurological deficits. Extremities: Symmetric 5 x 5 power. Skin: No rashes, lesions or ulcers Psychiatry: Judgement and insight appear normal. Mood & affect appropriate.     Data Reviewed: I have personally reviewed following labs and imaging studies  CBC: Recent Labs  Lab 09/22/20 1228 09/24/20 0658 09/25/20 0515 09/26/20 0508  WBC 24.4* 15.6* 13.3* 9.9  NEUTROABS 21.3* 11.1*  --  6.0  HGB 17.1* 13.8 14.1 13.5  HCT 47.6* 39.8 40.2 39.4  MCV  86.2 89.4 89.7 90.4  PLT 273 188 166 017    Basic Metabolic Panel: Recent Labs  Lab 09/22/20 1228 09/22/20 2032 09/23/20 1123 09/24/20 0644 09/24/20 0658 09/25/20 0515 09/26/20 0508  NA 135  --  138 131*  --  135 136  K 3.1*  --  3.8 3.5  --  3.2* 3.7  CL 101  --  101 102  --  103 105  CO2 21*  --  22 20*  --  22 23  GLUCOSE 227*  --  173* 108*  --  112* 101*  BUN 28*  --  31* 30*  --  33* 25*  CREATININE 1.56*  --  1.79* 1.88*  --  2.09* 1.99*  CALCIUM 13.5* 13.5* 13.1* 10.4*  --  10.6* 10.8*  MG  --  2.2 2.1  --  2.0 2.1 2.0  PHOS  --   --  1.3* 2.9  --  2.8 2.4*    GFR: Estimated Creatinine Clearance: 26.6 mL/min (A) (by C-G formula based on SCr of 1.99 mg/dL (H)).  Liver Function Tests: Recent Labs  Lab 09/22/20 1228 09/24/20 0644 09/25/20 0515 09/26/20 0508  AST 17  --   --   --   ALT 16  --   --   --   ALKPHOS 122  --   --   --   BILITOT 1.0  --   --   --   PROT 8.5*  --   --   --   ALBUMIN 4.9 3.4* 3.4* 3.1*    CBG: Recent Labs  Lab 09/25/20 1234 09/25/20 1641 09/25/20 2130 09/26/20 0739 09/26/20 1158  GLUCAP 190* 88 191* 97 213*     Recent Results (from the past 240 hour(s))  Respiratory Panel by RT PCR (Flu A&B, Covid) - Nasopharyngeal Swab     Status: None   Collection Time: 09/22/20  8:25 PM   Specimen: Nasopharyngeal Swab  Result Value Ref Range Status   SARS Coronavirus 2 by RT PCR NEGATIVE NEGATIVE Final    Comment: (NOTE) SARS-CoV-2 target nucleic acids are NOT DETECTED.  The SARS-CoV-2 RNA is generally detectable in upper respiratoy specimens during the acute phase of infection. The lowest concentration of SARS-CoV-2 viral copies this assay can detect is 131 copies/mL. A negative result does not preclude SARS-Cov-2 infection and  should not be used as the sole basis for treatment or other patient management decisions. A negative result may occur with  improper specimen collection/handling, submission of specimen other than  nasopharyngeal swab, presence of viral mutation(s) within the areas targeted by this assay, and inadequate number of viral copies (<131 copies/mL). A negative result must be combined with clinical observations, patient history, and epidemiological information. The expected result is Negative.  Fact Sheet for Patients:  PinkCheek.be  Fact Sheet for Healthcare Providers:  GravelBags.it  This test is no t yet approved or cleared by the [place] FDA and  has been authorized for detection and/or diagnosis of SARS-CoV-2 by FDA under an Emergency Use Authorization (EUA). This EUA will remain  in effect (meaning this test can be used) for the duration of the COVID-19 declaration under Section 564(b)(1) of the Act, 21 U.S.C. section 360bbb-3(b)(1), unless the authorization is terminated or revoked sooner.     Influenza A by PCR NEGATIVE NEGATIVE Final   Influenza B by PCR NEGATIVE NEGATIVE Final    Comment: (NOTE) The Xpert Xpress SARS-CoV-2/FLU/RSV assay is intended as an aid in  the diagnosis of influenza from Nasopharyngeal swab specimens and  should not be used as a sole basis for treatment. Nasal washings and  aspirates are unacceptable for Xpert Xpress SARS-CoV-2/FLU/RSV  testing.  Fact Sheet for Patients: PinkCheek.be  Fact Sheet for Healthcare Providers: GravelBags.it  This test is not yet approved or cleared by the [place] FDA and  has been authorized for detection and/or diagnosis of SARS-CoV-2 by  FDA under an Emergency Use Authorization (EUA). This EUA will remain  in effect (meaning this test can be used) for the duration of the  Covid-19 declaration under Section 564(b)(1) of the Act, 21  U.S.C. section 360bbb-3(b)(1), unless the authorization is  terminated or revoked. Performed at [facility], [address] [place], [place] [address]     Culture, Urine     Status: Abnormal   Collection Time: 09/23/20  8:32 AM   Specimen: Urine, Catheterized  Result Value Ref Range Status   Specimen Description   Final    URINE, CATHETERIZED Performed at [facility] [address], [place], [place] [address]    Special Requests   Final    NONE Performed at [facility], [place] [address], [place], [place] [address]    Culture (A)  Final    30,000 COLONIES/mL MULTIPLE SPECIES PRESENT, SUGGEST RECOLLECTION   Report Status 09/24/2020 FINAL  Final  Culture, blood (Routine X 2) w Reflex to ID Panel     Status: None (Preliminary result)   Collection Time: 09/23/20 11:23 AM   Specimen: BLOOD RIGHT ARM  Result Value Ref Range Status   Specimen Description   Final    BLOOD RIGHT ARM Performed at [facility], [place] [address], [place], [place] [address]    Special Requests   Final    BOTTLES DRAWN AEROBIC AND ANAEROBIC Blood Culture adequate volume Performed at [facility] [address] [place], [place] [address]    Culture   Final    NO GROWTH 3 DAYS Performed at [facility], [place] [address] [place], [place] [address]    Report Status PENDING  Incomplete  Culture, blood (Routine X 2) w Reflex to ID Panel     Status: None (Preliminary result)   Collection Time: 09/23/20 11:24 AM   Specimen: BLOOD RIGHT ARM  Result Value Ref  Range Status   Specimen Description   Final    BLOOD RIGHT ARM Performed at [facility] [address] [place], [place] [address]    Special Requests   Final    BOTTLES DRAWN AEROBIC AND ANAEROBIC Blood Culture adequate volume Performed at [facility] [address] [place], [place] [address]    Culture   Final    NO GROWTH 3 DAYS Performed at [facility], [place] [address], [place], [place] [address]    Report Status PENDING  Incomplete         Radiology Studies: DG Chest 2 View  Result Date:  09/25/2020 CLINICAL DATA:  Hypercalcemia, shortness of breath EXAM: CHEST - 2 VIEW COMPARISON:  09/24/2019 FINDINGS: No focal consolidation. No pleural effusion or pneumothorax. Heart and mediastinal contours are unremarkable. Dual lead cardiac pacemaker. No acute osseous abnormality. IMPRESSION: No active cardiopulmonary disease. Electronically Signed   By: Kathreen Devoid   On: 09/25/2020 12:41   NM Parathyroid W/Spect  Result Date: 09/25/2020 CLINICAL DATA:  Hyperparathyroidism EXAM: NM PARATHYROID SCINTIGRAPHY AND SPECT IMAGING TECHNIQUE: Following intravenous administration of radiopharmaceutical, early and 2-hour delayed planar images were obtained in the anterior projection. Delayed triplanar SPECT images were also obtained at 2 hours. RADIOPHARMACEUTICALS:  24.0 mCi Tc-65m Sestamibi IV COMPARISON:  None FINDINGS: Planar imaging: Normal initial distribution of tracer within the thyroid lobes, LEFT lobe slightly larger in size than RIGHT. Delayed image shows washout of tracer from both thyroid lobes with slightly asymmetric residual tracer at the mid LEFT lobe. SPECT imaging: No abnormal sestamibi retention identified at the expected positions of the parathyroid glands to suggest parathyroid adenoma. No ectopic localization of tracer in the mediastinum. IMPRESSION: Negative parathyroid scan. Electronically Signed   By: Lavonia Dana M.D.   On: 09/25/2020 14:29        Scheduled Meds: . (feeding supplement) PROSource Plus  30 mL Oral Daily  . amLODipine  10 mg Oral Daily  . buPROPion  150 mg Oral Daily  . carvedilol  6.25 mg Oral BID  . escitalopram  20 mg Oral Daily  . feeding supplement  1 Container Oral TID BM  . gabapentin  100 mg Oral TID  . hydrALAZINE  25 mg Oral Daily  . insulin aspart  0-15 Units Subcutaneous TID WC  . insulin aspart  0-5 Units Subcutaneous QHS  . insulin glargine  5 Units Subcutaneous Daily  . lisinopril  20 mg Oral Daily  . metoCLOPramide  5 mg Oral TID AC & HS    . multivitamin with minerals  1 tablet Oral Daily  . oxybutynin  10 mg Oral QHS  . pantoprazole  40 mg Oral Q0600  . polyethylene glycol  17 g Oral Daily  . rosuvastatin  5 mg Oral Daily  . senna-docusate  1 tablet Oral BID  . sorbitol, milk of mag, mineral oil, glycerin (SMOG) enema  960 mL Rectal Once  . traZODone  100 mg Oral QHS   Continuous Infusions: . sodium chloride 100 mL/hr at 09/26/20 0830  . potassium PHOSPHATE IVPB (in mmol) 30 mmol (09/26/20 1032)     LOS: 3 days    Time spent: 35 minutes    Irine Seal, MD [facility]   To contact the attending provider between 7A-7P or the covering provider during after hours 7P-7A, please log into the web site [email or web address] and access using universal [place] password for that web site. If you do not have  the password, please call the hospital operator.  09/26/2020, 12:57 PM

## 2020-09-26 NOTE — Care Management Important Message (Signed)
Important Message  Patient Details IM Letter given to the Patient Name: Melissa Lopez MRN: [id number] Date of Birth: Mar 14, 1951   Medicare Important Message Given:  Yes     Kerin Salen 09/26/2020, 11:04 AM

## 2020-09-27 DIAGNOSIS — R112 Nausea with vomiting, unspecified: Secondary | ICD-10-CM | POA: Diagnosis not present

## 2020-09-27 LAB — GLUCOSE, CAPILLARY
Glucose-Capillary: 118 mg/dL — ABNORMAL HIGH (ref 70–99)
Glucose-Capillary: 196 mg/dL — ABNORMAL HIGH (ref 70–99)
Glucose-Capillary: 123 mg/dL — ABNORMAL HIGH (ref 70–99)
Glucose-Capillary: 97 mg/dL (ref 70–99)

## 2020-09-27 LAB — RENAL FUNCTION PANEL
Albumin: 3.2 g/dL — ABNORMAL LOW (ref 3.5–5.0)
Anion gap: 7 (ref 5–15)
BUN: 18 mg/dL (ref 8–23)
CO2: 22 mmol/L (ref 22–32)
Calcium: 10.7 mg/dL — ABNORMAL HIGH (ref 8.9–10.3)
Chloride: 106 mmol/L (ref 98–111)
Creatinine, Ser: 1.99 mg/dL — ABNORMAL HIGH (ref 0.44–1.00)
GFR, Estimated: 25 mL/min — ABNORMAL LOW (ref >60)
Glucose, Bld: 111 mg/dL — ABNORMAL HIGH (ref 70–99)
Phosphorus: 3 mg/dL (ref 2.5–4.6)
Potassium: 4.4 mmol/L (ref 3.5–5.1)
Sodium: 135 mmol/L (ref 135–145)

## 2020-09-27 MED ORDER — LISINOPRIL 20 MG PO TABS
20.0000 mg | ORAL_TABLET | Freq: Every day | ORAL | Status: DC
  Administered 2020-09-28 – 2020-09-29 (×2): 20 mg via ORAL
  Filled 2020-09-27 (×2): qty 1

## 2020-09-27 MED ORDER — SODIUM CHLORIDE 0.45 % IV SOLN: INTRAVENOUS | Status: DC

## 2020-09-27 MED ORDER — PANTOPRAZOLE SODIUM 40 MG IV SOLR
40.0000 mg | Freq: Two times a day (BID) | INTRAVENOUS | Status: DC
  Administered 2020-09-27 – 2020-09-28 (×3): 40 mg via INTRAVENOUS
  Filled 2020-09-27 (×3): qty 40

## 2020-09-27 NOTE — Progress Notes (Signed)
Pt with dry heaves and abdominal pain this morning with activity. Pt stated her last BM was 10/19 and it was a couple large pieces of stool. PRN's administered.

## 2020-09-27 NOTE — Consult Note (Signed)
Referring Provider: Dr. Vernell Leep Primary Care Physician:  Celene Squibb, MD Primary Gastroenterologist:  Althia Forts  Reason for Consultation:  Intractable nausea/vomiting, abdominal pain  HPI: Melissa Lopez is a 68 y.o. female with medical history pertinent for DM type 2 (on insulin), chronic CHF, bradycardia s/p pacemaker, CKD stage III, and nausea/vomiting presenting with a chief complaint of nausea and vomiting.  Patient states she has had persistent nausea and vomiting for the past month.  She reports vomiting occurring daily, typically after meals.  She has seen a small amount of bright red blood mixed with the vomit on occasion, last occurring Monday 09/25/2020.  Denies any coffee-ground emesis.  Intake is decreased with nausea and vomiting, though she denies any unexplained weight loss.  Per chart review, patient has had a 4 pound weight loss in the past 1.5 months (2.4% of body weight).  Denies any GERD or dysphagia.  Patient also reports epigastric abdominal pain, as well as suprapubic/lower abdominal pain.  States this has been ongoing for at least 1 week.  Also reports recent constipation, stating she had an enema yesterday to facilitate a bowel movement, but prior to that, she had not had a normal sized bowel movement for approximately 1 week.  Denies any melena or hematochezia.  Denies any aspirin, NSAID, or blood thinner use.  Denies any family history of colon cancer or gastrointestinal malignancy.  Reports marijuana use twice per day for many years.  Past Medical History:  Diagnosis Date  . CKD (chronic kidney disease) stage 3, GFR 30-59 ml/min (HCC) 10/31/2018   - per patient report  . Coronary artery disease    per patient report - 2 caths with non-obstructive CAD  . Essential hypertension   . H/O cardiac pacemaker 10/31/2018  . Sick sinus syndrome ([place])   . Type 2 diabetes mellitus with complication, with long-term current use of insulin ([place]) 10/31/2018   with  diabetic nephropathy    Past Surgical History:  Procedure Laterality Date  . CARDIAC PACEMAKER PLACEMENT  2008   in [place] for sick [place] syndrome  . ESOPHAGOGASTRODUODENOSCOPY (EGD) WITH PROPOFOL N/A 11/10/2018   Procedure: ESOPHAGOGASTRODUODENOSCOPY (EGD) WITH PROPOFOL;  Surgeon: Wilford Corner, MD;  Location: [facility];  Service: Endoscopy;  Laterality: N/A;  . LEFT HEART CATH AND CORONARY ANGIOGRAPHY N/A 10/31/2018   Procedure: LEFT HEART CATH AND CORONARY ANGIOGRAPHY;  Surgeon: Leonie Man, MD;  Location: [facility];  Service: Cardiovascular;  Laterality: N/A;  . PACEMAKER GENERATOR CHANGE  09/2018   in [place]    Prior to Admission medications   Medication Sig Start Date End Date Taking? Authorizing Provider  amLODipine (NORVASC) 10 MG tablet TAKE ONE (1) TABLET BY MOUTH ONCE DAILY *PATIENT NEEDS APPOINTMENT* Patient taking differently: Take 10 mg by mouth daily.  08/31/20  Yes Verta Ellen., NP  aspirin 81 MG chewable tablet Chew 1 tablet (81 mg total) by mouth daily. 11/13/18  Yes Dessa Phi, DO  buPROPion [facility] SR) 150 MG 12 hr tablet Take 150 mg by mouth daily. 07/13/20  Yes [provider]  carvedilol (COREG) 6.25 MG tablet Take 1 tablet (6.25 mg total) by mouth 2 (two) times daily. 08/11/20  Yes Verta Ellen., NP  escitalopram (LEXAPRO) 20 MG tablet Take 20 mg by mouth daily. 08/11/20  Yes [provider]  gabapentin (NEURONTIN) 100 MG capsule Take 100 mg by mouth 3 (three) times daily. 08/11/20  Yes [provider]  hydrALAZINE (  APRESOLINE) 50 MG tablet Take 25 mg by mouth daily.    Yes [provider]  insulin degludec (TRESIBA FLEXTOUCH) 100 UNIT/ML FlexTouch Pen Inject 5 Units into the skin daily.   Yes [provider]  lisinopril (ZESTRIL) 20 MG tablet Take 1 tablet (20 mg total) by mouth daily. 08/11/20 11/09/20 Yes Verta Ellen., NP  LORazepam (ATIVAN) 1 MG tablet Take 1 mg  by mouth daily as needed for anxiety. 05/10/20  Yes [provider]  metoCLOPramide (REGLAN) 5 MG tablet Take 1 tablet (5 mg total) by mouth every 6 (six) hours. 11/12/18 09/22/20 Yes Dessa Phi, DO  omeprazole (PRILOSEC) 20 MG capsule Take 20 mg by mouth daily.   Yes [provider]  oxybutynin (DITROPAN-XL) 10 MG 24 hr tablet Take 10 mg by mouth at bedtime.   Yes [provider]  rosuvastatin (CRESTOR) 5 MG tablet TAKE 1 TABLET BY MOUTH DAILY AT 6PM *PATIENT NEEDS APPOINTMENT* Patient taking differently: Take 5 mg by mouth daily.  08/31/20  Yes Verta Ellen., NP  traZODone (DESYREL) 100 MG tablet Take 100 mg by mouth at bedtime.   Yes [provider]  simethicone (MYLICON) 559 MG chewable tablet Chew 125-250 mg by mouth every 6 (six) hours as needed for flatulence.    [provider]    Current Facility-Administered Medications  Medication Dose Route Frequency Provider Last Rate Last Admin  . (feeding supplement) PROSource Plus liquid 30 mL  30 mL Oral Daily Eugenie Filler, MD      . 0.45 % sodium chloride infusion   Intravenous Continuous Eugenie Filler, MD 100 mL/hr at 09/27/20 0556 New Bag at 09/27/20 0556  . acetaminophen (TYLENOL) tablet 650 mg  650 mg Oral Q6H PRN Kathie Dike, MD   650 mg at 09/27/20 0908   Or  . acetaminophen (TYLENOL) suppository 650 mg  650 mg Rectal Q6H PRN Kathie Dike, MD      . amLODipine (NORVASC) tablet 10 mg  10 mg Oral Daily Kathie Dike, MD   10 mg at 09/26/20 1024  . buPROPion (WELLBUTRIN XL) 24 hr tablet 150 mg  150 mg Oral Daily Kathie Dike, MD   150 mg at 09/26/20 1024  . carvedilol (COREG) tablet 6.25 mg  6.25 mg Oral BID Kathie Dike, MD   6.25 mg at 09/26/20 2140  . escitalopram (LEXAPRO) tablet 20 mg  20 mg Oral Daily Kathie Dike, MD   20 mg at 09/26/20 1025  . feeding supplement (BOOST / RESOURCE BREEZE) liquid 1 Container  1 Container Oral TID BM Eugenie Filler, MD    1 Container at 09/26/20 1027  . gabapentin (NEURONTIN) capsule 100 mg  100 mg Oral TID Kathie Dike, MD   100 mg at 09/26/20 2140  . hydrALAZINE (APRESOLINE) injection 10 mg  10 mg Intravenous Q6H PRN Eugenie Filler, MD      . hydrALAZINE (APRESOLINE) tablet 25 mg  25 mg Oral Daily Kathie Dike, MD   25 mg at 09/26/20 1025  . insulin aspart (novoLOG) injection 0-15 Units  0-15 Units Subcutaneous TID WC Kathie Dike, MD   5 Units at 09/26/20 1233  . insulin aspart (novoLOG) injection 0-5 Units  0-5 Units Subcutaneous QHS Memon, Jolaine Artist, MD      . insulin glargine (LANTUS) injection 5 Units  5 Units Subcutaneous Daily Kathie Dike, MD   5 Units at 09/26/20 1027  . [START ON 09/28/2020] lisinopril (ZESTRIL) tablet 20 mg  20 mg Oral Daily Hongalgi, Anand D, MD      . LORazepam (ATIVAN) tablet 1 mg  1 mg Oral Daily PRN Kathie Dike, MD   1 mg at 09/22/20 2024  . metoCLOPramide (REGLAN) injection 10 mg  10 mg Intravenous Q6H Eugenie Filler, MD   10 mg at 09/27/20 0556  . morphine 2 MG/ML injection 2 mg  2 mg Intravenous Q2H PRN Kathie Dike, MD   2 mg at 09/26/20 1651  . multivitamin with minerals tablet 1 tablet  1 tablet Oral Daily Eugenie Filler, MD   1 tablet at 09/26/20 1026  . ondansetron (ZOFRAN) tablet 4 mg  4 mg Oral Q6H PRN Kathie Dike, MD       Or  . ondansetron (ZOFRAN) injection 4 mg  4 mg Intravenous Q6H PRN Kathie Dike, MD   4 mg at 09/27/20 0901  . oxybutynin (DITROPAN-XL) 24 hr tablet 10 mg  10 mg Oral QHS Kathie Dike, MD   10 mg at 09/26/20 2142  . pantoprazole (PROTONIX) EC tablet 40 mg  40 mg Oral Q0600 Eugenie Filler, MD   40 mg at 09/27/20 0557  . polyethylene glycol (MIRALAX / GLYCOLAX) packet 17 g  17 g Oral BID Eugenie Filler, MD      . prochlorperazine (COMPAZINE) injection 10 mg  10 mg Intravenous Q6H PRN Eugenie Filler, MD      . rosuvastatin (CRESTOR) tablet 5 mg  5 mg Oral Daily Kathie Dike, MD   5 mg at 09/26/20  1026  . senna-docusate (Senokot-S) tablet 1 tablet  1 tablet Oral BID Eugenie Filler, MD   1 tablet at 09/26/20 2140  . sorbitol, milk of mag, mineral oil, glycerin (SMOG) enema  960 mL Rectal Once Eugenie Filler, MD      . traZODone (DESYREL) tablet 100 mg  100 mg Oral QHS Kathie Dike, MD   100 mg at 09/26/20 2141    Allergies as of 09/22/2020 - Review Complete 09/22/2020  Allergen Reaction Noted  . Oxycodone-acetaminophen Anaphylaxis 10/31/2018  . Contrast media [iodinated diagnostic agents]  04/13/2020  . No known allergies  04/13/2020  . Oxycodone  04/13/2020  . Xarelto  [rivaroxaban] Swelling 04/13/2020    Family History  Family history unknown: Yes    Social History   Socioeconomic History  . Marital status: Single    Spouse name: Not on file  . Number of children: Not on file  . Years of education: Not on file  . Highest education level: Not on file  Occupational History  . Not on file  Tobacco Use  . Smoking status: Former Smoker    Years: 3.00    Quit date: 12/09/1969    Years since quitting: 50.8  . Smokeless tobacco: Never Used  Substance and Sexual Activity  . Alcohol use: Not Currently  . Drug use: Never  . Sexual activity: Not on file  Other Topics Concern  . Not on file  Social History Narrative   Moved from [place] to be with daughter in [place].   Now in [employer]   Social Determinants of Health   Financial Resource Strain:   . Difficulty of Paying Living Expenses: Not on file  Food Insecurity:   . Worried About Charity fundraiser in the Last Year: Not on file  . Ran Out of Food in the Last Year: Not on file  Transportation Needs:   . Lack of Transportation (Medical): Not on file  .  Lack of Transportation (Non-Medical): Not on file  Physical Activity:   . Days of Exercise per Week: Not on file  . Minutes of Exercise per Session: Not on file  Stress:   . Feeling of Stress : Not on file  Social Connections:   . Frequency of Communication  with Friends and Family: Not on file  . Frequency of Social Gatherings with Friends and Family: Not on file  . Attends Religious Services: Not on file  . Active Member of Clubs or Organizations: Not on file  . Attends Archivist Meetings: Not on file  . Marital Status: Not on file  Intimate Partner Violence:   . Fear of Current or Ex-Partner: Not on file  . Emotionally Abused: Not on file  . Physically Abused: Not on file  . Sexually Abused: Not on file    Review of Systems: Review of Systems  Constitutional: Negative for chills, fever and weight loss.  HENT: Negative for hearing loss and tinnitus.   Eyes: Negative for pain and redness.  Respiratory: Negative for cough and shortness of breath.   Cardiovascular: Negative for chest pain and palpitations.  Gastrointestinal: Positive for abdominal pain, constipation, nausea and vomiting. Negative for blood in stool, diarrhea, heartburn and melena.  Genitourinary: Negative for dysuria and hematuria.  Musculoskeletal: Negative for falls and joint pain.  Skin: Negative for itching and rash.  Neurological: Negative for seizures and loss of consciousness.  Endo/Heme/Allergies: Negative for polydipsia. Does not bruise/bleed easily.  Psychiatric/Behavioral: Positive for substance abuse (marijuana). The patient is not nervous/anxious.      Physical Exam: Vital signs in last 24 hours: Temp:  [97.8 F (36.6 C)-98.7 F (37.1 C)] 98.3 F (36.8 C) (10/20 0508) Pulse Rate:  [59-60] 60 (10/20 0508) Resp:  [14-20] 14 (10/20 0508) BP: (135-162)/(74-87) 162/85 (10/20 0508) SpO2:  [95 %-98 %] 98 % (10/20 0508) Weight:  [72.9 kg] 72.9 kg (10/20 0500) Last BM Date: 09/26/20 General:  Lethargic,  Well-developed, well-nourished, pleasant and cooperative in NAD Head: Normocephalic and atraumatic. Eyes: Sclera clear, no icterus.   Conjunctiva pink. Mouth:  No ulcerations or lesions.  Oropharynx pink & moist. Neck:  No masses or  thyromegaly. Lungs: Clear throughout to auscultation.   No wheezes, crackles, or rhonchi. No evident respiratory distress. Heart:  Regular rate and rhythm; no murmurs, clicks, rubs,  or gallops. Abdomen: Soft, mild epigastric tenderness and mild central lower abdominal tenderness, nontympanitic, and nondistended. No masses, hepatosplenomegaly or ventral hernias noted. Normal bowel sounds, without bruits, guarding, or rebound.   Msk:  Symmetrical without gross deformities. Pulses: Normal pulses noted. Extremities: Without clubbing, cyanosis, or edema. Neurologic: Alert and coherent;  grossly normal neurologically. Skin: Intact without significant lesions or rashes. Cervical Nodes: No significant cervical adenopathy. Psych:  Alert and cooperative. Normal mood and affect.  Intake/Output from previous day: 10/19 0701 - 10/20 0700 In: 3453.9 [P.O.:820; I.V.:2205; IV Piggyback:429] Out: 650 [Urine:650] Intake/Output this shift: No intake/output data recorded.  Lab Results: Recent Labs    09/25/20 0515 09/26/20 0508  WBC 13.3* 9.9  HGB 14.1 13.5  HCT 40.2 39.4  PLT 166 164   BMET Recent Labs    09/25/20 0515 09/26/20 0508 09/27/20 0517  NA 135 136 135  K 3.2* 3.7 4.4  CL 103 105 106  CO2 22 23 22   GLUCOSE 112* 101* 111*  BUN 33* 25* 18  CREATININE 2.09* 1.99* 1.99*  CALCIUM 10.6* 10.8* 10.7*   LFT Recent Labs    09/27/20  6945  ALBUMIN 3.2*   PT/INR No results for input(s): LABPROT, INR in the last 72 hours.  Studies/Results: DG Chest 2 View  Result Date: 09/25/2020 CLINICAL DATA:  Hypercalcemia, shortness of breath EXAM: CHEST - 2 VIEW COMPARISON:  09/24/2019 FINDINGS: No focal consolidation. No pleural effusion or pneumothorax. Heart and mediastinal contours are unremarkable. Dual lead cardiac pacemaker. No acute osseous abnormality. IMPRESSION: No active cardiopulmonary disease. Electronically Signed   By: Kathreen Devoid   On: 09/25/2020 12:41   NM Parathyroid  W/Spect  Result Date: 09/25/2020 CLINICAL DATA:  Hyperparathyroidism EXAM: NM PARATHYROID SCINTIGRAPHY AND SPECT IMAGING TECHNIQUE: Following intravenous administration of radiopharmaceutical, early and 2-hour delayed planar images were obtained in the anterior projection. Delayed triplanar SPECT images were also obtained at 2 hours. RADIOPHARMACEUTICALS:  24.0 mCi Tc-91m Sestamibi IV COMPARISON:  None FINDINGS: Planar imaging: Normal initial distribution of tracer within the thyroid lobes, LEFT lobe slightly larger in size than RIGHT. Delayed image shows washout of tracer from both thyroid lobes with slightly asymmetric residual tracer at the mid LEFT lobe. SPECT imaging: No abnormal sestamibi retention identified at the expected positions of the parathyroid glands to suggest parathyroid adenoma. No ectopic localization of tracer in the mediastinum. IMPRESSION: Negative parathyroid scan. Electronically Signed   By: Lavonia Dana M.D.   On: 09/25/2020 14:29    Impression: Intractable nausea and vomiting: Diabetic gastroparesis versus cannabinoid hyperemesis syndrome.  Intermittent small amounts of bright red blood in the vomit, suspect Mallory-Weiss tear versus esophagitis from persistent vomiting. -Hgb 13.5 as of 09/26/2020, stable  DM type II, insulin-dependent  Diastolic CHF, EF 55 to 03% as of echo 09/14/2020  CKD stage III: BUN 18/Cr 1.99  Hypercalcemia on admission, improved, calcium 10.7 today  Biliary dilation s/p remote cholecystectomy (30 years ago) with normal LFTs.  CT 09/22/2020: Intrahepatic and extrahepatic biliary dilatation post cholecystectomy, likely physiologic.  Common hepatic duct measures up to 63mm in diameter.  Plan: EGD tomorrow.  I thoroughly discussed the procedure with the patient to include nature, alternatives, benefits, and risks (including but not limited to bleeding, infection, perforation, anesthesia/cardiac and pulmonary complications).  Patient verbalized  understanding and gave verbal consent to proceed with EGD.  In the meantime, initiate Protonix 40 mg IV twice daily.  Continue Reglan for now.  Continue antiemetics as needed.  If EGD is negative, we will plan for gastric emptying study, for which we will need to discontinue Reglan and narcotics 2 days prior to study.  [facility] will follow.   LOS: 4 days   Salley Slaughter  09/27/2020, 10:44 AM [phone]

## 2020-09-27 NOTE — Progress Notes (Signed)
PROGRESS NOTE   Melissa Lopez  VWU:[id number]    DOB: Jul 19, 1951    DOA: 09/22/2020  PCP: Celene Squibb, MD   I have briefly reviewed patients previous medical records in [facility].  Chief Complaint  Patient presents with   Hematemesis    Brief Narrative:  69 year old female with PMH of IDDM, HTN, chronic diastolic CHF, symptomatic bradycardia s/p pacemaker, chronic kidney disease stage IIIb, presented to the ED with complaints of abdominal pain and vomiting of approximately 1 week duration which progressively worsened and she was unable to keep anything down.  She was admitted for intractable nausea, vomiting and electrolyte abnormalities (hypercalcemia, hypokalemia, hypophosphatemia).   Assessment & Plan:  Principal Problem:   Hyperparathyroidism, primary ([place]) Active Problems:   Type 2 diabetes mellitus with complication, with long-term current use of insulin (HCC)   CKD (chronic kidney disease) stage 3, GFR 30-59 ml/min (HCC) - per patient report   Essential hypertension   Nausea with vomiting   Hypercalcemia   Hypokalemia   Chronic diastolic CHF (congestive heart failure) (HCC)   Emesis   Hypophosphatemia   Intractable nausea, vomiting and abdominal pain:  Unclear etiology.  CT abdomen and pelvis without acute findings.  Patient reports intermittent symptoms of almost 1 year duration which recently worsened.  EGD in system in 2019 showed gastritis.  Reports she has never had a colonoscopy.  Initially felt to be due to hypercalcemia and constipation.  However despite treatment of these in the hospital, continues to have nausea and vomiting.  Thereby GI consulted on 10/20.  Other DD include possible diabetic gastroparesis, gastritis, esophagitis, PUD versus others.  GI input appreciated and there DD: Diabetic gastroparesis, bile reflux gastritis, THC induced nausea and vomiting or less likely an anatomic lesion such as acquired pyloric stenosis, ulcer disease  or neoplasia.  They plan EGD.  They defer screening colonoscopy to outpatient follow-up with PCP.  Continue Protonix IV twice daily, Reglan and antiemetics.  Per GI if EGD is negative then plan gastric emptying study for which Reglan and narcotics will need to be discontinued 2 days prior.  Chronic constipation:  Patient reported to GI that she takes MiraLAX on a daily basis but despite that she requires a suppository every several days to have a BM.  S/p sorbitol and soapsuds enema with good results.  Increased MiraLAX to twice daily.  Screening colonoscopy as outpatient through PCP.  Primary hyperparathyroidism/hypercalcemia secondary to primary hyperparathyroidism:  Hypercalcemia possibly also due to dehydration on admission.  She does have history of stage III CKD.  Phosphorus levels decreased, PTH elevated at 85, intact calcium at 13.5, TSH normal, vitamin D 25 hydroxy level: 20.14/low.  Parathyroid scan was negative.  Patient reported that she was evaluated in [place] in the past and noted to have a small nodule on her parathyroid gland and surveillance was recommended.  Recommend outpatient follow-up with PCP, general surgery.  Serum calcium levels have not significantly changed over the last 3 to 4 days.  Consider discussing with nephrology tomorrow.  Hypophosphatemia:  Replaced.  Hypokalemia  Replaced.  Magnesium normal.  Stage IIIb chronic kidney disease:  Creatinine 1.99 for the last 2 days, stable and at baseline.  Essential hypertension:  Controlled.  Continue amlodipine, carvedilol, hydralazine and lisinopril.  Type II DM with renal complications  Last G9F 6.4, well controlled.  Reasonably controlled on low-dose Lantus and SSI.  Continue gabapentin for neuropathy.  Chronic diastolic CHF/history of symptomatic bradycardia s/p permanent pacemaker.  Compensated  and does not clinically appear volume overloaded.  Continue cardiac meds as below.  Body  mass index is 27.59 kg/m.  Nutritional Status Nutrition Problem: Increased nutrient needs Etiology: acute illness Signs/Symptoms: estimated needs Interventions: Boost Breeze, MVI, Other (Comment) (Prosource Plus)  DVT prophylaxis: SCDs Start: 09/22/20 1958     Code Status: Full Code Family Communication: None at bedside Disposition:  Status is: Inpatient  Remains inpatient appropriate because:Inpatient level of care appropriate due to severity of illness   Dispo: The patient is from: Home              Anticipated d/c is to: Home              Anticipated d/c date is: 2 days              Patient currently is not medically stable to d/c.        Consultants:   [facility]  Procedures:   None  Antimicrobials:    Anti-infectives (From admission, onward)   None        Subjective:  Seen this morning.  Reported 1-2 episodes of vomiting last night and one episode early this morning.  Ongoing mid abdominal pain, unable to clearly describe.  Reports BM with hard stools x2 yesterday.  Did eat a small amount of her breakfast this morning  Objective:   Vitals:   09/26/20 2121 09/27/20 0500 09/27/20 0508 09/27/20 1150  BP: 140/74  (!) 162/85 127/79  Pulse: (!) 59  60 (!) 59  Resp: 20  14 17   Temp: 98.7 F (37.1 C)  98.3 F (36.8 C) 98.4 F (36.9 C)  TempSrc: Oral  Oral Oral  SpO2: 95%  98% 96%  Weight:  72.9 kg    Height:        General exam: Pleasant middle-aged female, moderately built and nourished sitting up comfortably in chair this morning.  Oral mucosa moist. Respiratory system: Clear to auscultation. Respiratory effort normal. Cardiovascular system: S1 & S2 heard, RRR. No JVD, murmurs, rubs, gallops or clicks. No pedal edema.  Telemetry personally reviewed: AV paced rhythm. Gastrointestinal system: Abdomen is nondistended, soft and nontender. No organomegaly or masses felt. Normal bowel sounds heard. Central nervous system: Alert and oriented. No focal  neurological deficits. Extremities: Symmetric 5 x 5 power. Skin: No rashes, lesions or ulcers Psychiatry: Judgement and insight appear normal. Mood & affect appropriate.     Data Reviewed:   I have personally reviewed following labs and imaging studies   CBC: Recent Labs  Lab 09/22/20 1228 09/22/20 1228 09/24/20 0658 09/25/20 0515 09/26/20 0508  WBC 24.4*   < > 15.6* 13.3* 9.9  NEUTROABS 21.3*  --  11.1*  --  6.0  HGB 17.1*   < > 13.8 14.1 13.5  HCT 47.6*   < > 39.8 40.2 39.4  MCV 86.2   < > 89.4 89.7 90.4  PLT 273   < > 188 166 164   < > = values in this interval not displayed.    Basic Metabolic Panel: Recent Labs  Lab 09/24/20 0644 09/24/20 0658 09/25/20 0515 09/26/20 0508 09/27/20 0517  NA   < >  --  135 136 135  K   < >  --  3.2* 3.7 4.4  CL   < >  --  103 105 106  CO2   < >  --  22 23 22   GLUCOSE   < >  --  112* 101* 111*  BUN   < >  --  33* 25* 18  CREATININE   < >  --  2.09* 1.99* 1.99*  CALCIUM   < >  --  10.6* 10.8* 10.7*  MG  --  2.0 2.1 2.0  --   PHOS   < >  --  2.8 2.4* 3.0   < > = values in this interval not displayed.    Liver Function Tests: Recent Labs  Lab 09/22/20 1228 09/24/20 0644 09/25/20 0515 09/26/20 0508 09/27/20 0517  AST 17  --   --   --   --   ALT 16  --   --   --   --   ALKPHOS 122  --   --   --   --   BILITOT 1.0  --   --   --   --   PROT 8.5*  --   --   --   --   ALBUMIN 4.9   < > 3.4* 3.1* 3.2*   < > = values in this interval not displayed.    CBG: Recent Labs  Lab 09/27/20 0726 09/27/20 1148 09/27/20 1630  GLUCAP 118* 196* 123*    Microbiology Studies:   Recent Results (from the past 240 hour(s))  Respiratory Panel by RT PCR (Flu A&B, Covid) - Nasopharyngeal Swab     Status: None   Collection Time: 09/22/20  8:25 PM   Specimen: Nasopharyngeal Swab  Result Value Ref Range Status   SARS Coronavirus 2 by RT PCR NEGATIVE NEGATIVE Final    Comment: (NOTE) SARS-CoV-2 target nucleic acids are NOT  DETECTED.  The SARS-CoV-2 RNA is generally detectable in upper respiratoy specimens during the acute phase of infection. The lowest concentration of SARS-CoV-2 viral copies this assay can detect is 131 copies/mL. A negative result does not preclude SARS-Cov-2 infection and should not be used as the sole basis for treatment or other patient management decisions. A negative result may occur with  improper specimen collection/handling, submission of specimen other than nasopharyngeal swab, presence of viral mutation(s) within the areas targeted by this assay, and inadequate number of viral copies (<131 copies/mL). A negative result must be combined with clinical observations, patient history, and epidemiological information. The expected result is Negative.  Fact Sheet for Patients:  PinkCheek.be  Fact Sheet for Healthcare Providers:  GravelBags.it  This test is no t yet approved or cleared by the [place] FDA and  has been authorized for detection and/or diagnosis of SARS-CoV-2 by FDA under an Emergency Use Authorization (EUA). This EUA will remain  in effect (meaning this test can be used) for the duration of the COVID-19 declaration under Section 564(b)(1) of the Act, 21 U.S.C. section 360bbb-3(b)(1), unless the authorization is terminated or revoked sooner.     Influenza A by PCR NEGATIVE NEGATIVE Final   Influenza B by PCR NEGATIVE NEGATIVE Final    Comment: (NOTE) The Xpert Xpress SARS-CoV-2/FLU/RSV assay is intended as an aid in  the diagnosis of influenza from Nasopharyngeal swab specimens and  should not be used as a sole basis for treatment. Nasal washings and  aspirates are unacceptable for Xpert Xpress SARS-CoV-2/FLU/RSV  testing.  Fact Sheet for Patients: PinkCheek.be  Fact Sheet for Healthcare Providers: GravelBags.it  This test is not yet  approved or cleared by the [place] FDA and  has been authorized for detection and/or diagnosis of SARS-CoV-2 by  FDA under an Emergency Use Authorization (EUA). This EUA will remain  in effect (meaning this test can be used) for  the duration of the  Covid-19 declaration under Section 564(b)(1) of the Act, 21  U.S.C. section 360bbb-3(b)(1), unless the authorization is  terminated or revoked. Performed at [facility], [address] [place], [place] [address]   Culture, Urine     Status: Abnormal   Collection Time: 09/23/20  8:32 AM   Specimen: Urine, Catheterized  Result Value Ref Range Status   Specimen Description   Final    URINE, CATHETERIZED Performed at [facility] [address] [place], [place] [address]    Special Requests   Final    NONE Performed at [facility], [place] [address] [place], [place] [address]    Culture (A)  Final    30,000 COLONIES/mL MULTIPLE SPECIES PRESENT, SUGGEST RECOLLECTION   Report Status 09/24/2020 FINAL  Final  Culture, blood (Routine X 2) w Reflex to ID Panel     Status: None (Preliminary result)   Collection Time: 09/23/20 11:23 AM   Specimen: BLOOD RIGHT ARM  Result Value Ref Range Status   Specimen Description   Final    BLOOD RIGHT ARM Performed at [facility], [place] [address], [place], [place] [address]    Special Requests   Final    BOTTLES DRAWN AEROBIC AND ANAEROBIC Blood Culture adequate volume Performed at [facility] [address], [place], [place] [address]    Culture   Final    NO GROWTH 4 DAYS Performed at [facility], [place] [address] [place], [place] [address]    Report Status PENDING  Incomplete  Culture, blood (Routine X 2) w Reflex to ID Panel     Status: None (Preliminary result)   Collection Time: 09/23/20 11:24 AM   Specimen: BLOOD RIGHT ARM  Result Value Ref Range Status   Specimen Description   Final    BLOOD RIGHT ARM Performed at  [facility], [place] [address] [place], [place] [address]    Special Requests   Final    BOTTLES DRAWN AEROBIC AND ANAEROBIC Blood Culture adequate volume Performed at [facility] [address] [place], [place] [address]    Culture   Final    NO GROWTH 4 DAYS Performed at [facility], [place] [address], [place], [place] [address]    Report Status PENDING  Incomplete     Radiology Studies:  No results found.   Scheduled Meds:    (feeding supplement) PROSource Plus  30 mL Oral Daily   amLODipine  10 mg Oral Daily   buPROPion  150 mg Oral Daily   carvedilol  6.25 mg Oral BID   escitalopram  20 mg Oral Daily   feeding supplement  1 Container Oral TID BM   gabapentin  100 mg Oral TID   hydrALAZINE  25 mg Oral Daily   insulin aspart  0-15 Units Subcutaneous TID WC   insulin aspart  0-5 Units Subcutaneous QHS   insulin glargine  5 Units Subcutaneous Daily   [START ON 09/28/2020] lisinopril  20 mg Oral Daily   metoCLOPramide (REGLAN) injection  10 mg Intravenous Q6H   multivitamin with minerals  1 tablet Oral Daily   oxybutynin  10 mg Oral QHS   pantoprazole (PROTONIX) IV  40 mg Intravenous Q12H   polyethylene glycol  17 g Oral BID   rosuvastatin  5 mg Oral Daily   senna-docusate  1 tablet Oral BID   sorbitol, milk of mag, mineral oil,  glycerin (SMOG) enema  960 mL Rectal Once   traZODone  100 mg Oral QHS    Continuous Infusions:    sodium chloride 100 mL/hr at 09/27/20 1631     LOS: 4 days     Vernell Leep, MD, [place], [facility]. [facility]    To contact the attending provider between 7A-7P or the covering provider during after hours 7P-7A, please log into the web site [email or web address] and access using universal [place] password for that web site. If you do not have the password, please call the hospital operator.  09/27/2020, 5:55 PM

## 2020-09-27 NOTE — Progress Notes (Signed)
Physical Therapy Treatment Patient Details Name: Melissa Lopez MRN: [id number] DOB: [birth year] Today's Date: 09/27/2020    History of Present Illness 69 y.o. female with medical history significant of insulin-dependent diabetes, hypertension, chronic diastolic congestive heart failure, symptomatic bradycardia status post pacemaker, chronic kidney disease stage IIIb, presents emergency room with complaints of abdominal pain and vomiting--Likely secondary to hypercalcemia and probable constipation, Hypercalcemia-concern may be secondary to dehydration, Hypokalemia-secondary to GI losses    PT Comments    Pt making good progress.  Required min cues for safety.  No unsteadiness today with RW, provided min guard for safety.  Pt has 24 hr supervision per evaluation - continue plan of care with recommendation to return home with HHPT.    Follow Up Recommendations  Home health PT;Supervision/Assistance - 24 hour     Equipment Recommendations  None recommended by PT    Recommendations for Other Services       Precautions / Restrictions Precautions Precautions: Fall Restrictions Weight Bearing Restrictions: No    Mobility  Bed Mobility               General bed mobility comments: in chair at arrival  Transfers Overall transfer level: Needs assistance Equipment used: Rolling walker (2 wheeled) Transfers: Sit to/from Stand Sit to Stand: Supervision            Ambulation/Gait Ambulation/Gait assistance: Min guard Gait Distance (Feet): 150 Feet Assistive device: Rolling walker (2 wheeled) Gait Pattern/deviations: Step-through pattern Gait velocity: decreased   General Gait Details: Min cues for RW proximity - particularly with turns.  Min guard for safety and line managment.   Stairs             Wheelchair Mobility    Modified Rankin (Stroke Patients Only)       Balance Overall balance assessment: Needs assistance Sitting-balance support: No upper  extremity supported;Feet supported Sitting balance-Leahy Scale: Good     Standing balance support: Bilateral upper extremity supported;No upper extremity supported Standing balance-Leahy Scale: Fair Standing balance comment: static standing and ADLs without UE support; RW to walk                            Cognition Arousal/Alertness: Awake/alert Behavior During Therapy: WFL for tasks assessed/performed Overall Cognitive Status: Within Functional Limits for tasks assessed                                        Exercises      General Comments General comments (skin integrity, edema, etc.): Pt performed toielting ADLs with close guarding for safety.  Further activity held due to lunch arriving.      Pertinent Vitals/Pain Pain Assessment: No/denies pain    Home Living                      Prior Function            PT Goals (current goals can now be found in the care plan section) Acute Rehab PT Goals Patient Stated Goal: to be out of pain and get home to her grandbabies PT Goal Formulation: With patient Time For Goal Achievement: 10/09/20 Potential to Achieve Goals: Good Progress towards PT goals: Progressing toward goals    Frequency    Min 3X/week      PT Plan Current plan remains appropriate    Co-evaluation  AM-PAC PT "6 Clicks" Mobility   Outcome Measure  Help needed turning from your back to your side while in a flat bed without using bedrails?: None Help needed moving from lying on your back to sitting on the side of a flat bed without using bedrails?: None Help needed moving to and from a bed to a chair (including a wheelchair)?: A Little Help needed standing up from a chair using your arms (e.g., wheelchair or bedside chair)?: A Little Help needed to walk in hospital room?: A Little Help needed climbing 3-5 steps with a railing? : A Little 6 Click Score: 20    End of Session Equipment Utilized  During Treatment: Gait belt Activity Tolerance: Patient tolerated treatment well Patient left: with call bell/phone within reach;with chair alarm set;in chair Nurse Communication: Mobility status PT Visit Diagnosis: Unsteadiness on feet (R26.81);Muscle weakness (generalized) (M62.81)     Time: 5520-8022 PT Time Calculation (min) (ACUTE ONLY): 17 min  Charges:  $Gait Training: 8-22 mins                     Abran Richard, PT Acute Rehab Services Pager [phone] [facility] 09/27/2020, 2:16 PM

## 2020-09-27 NOTE — Progress Notes (Signed)
Pt complained of abdominal pain 7/10 after eating dinner. Pt ate 75% of meal.  Pain medication administered. Will continue to monitor.

## 2020-09-28 ENCOUNTER — Encounter (HOSPITAL_COMMUNITY)
Admission: EM | Disposition: A | Discharge status: Home Health | Payer: Self-pay | Source: Home / Self Care | Attending: Internal Medicine

## 2020-09-28 ENCOUNTER — Inpatient Hospital Stay (HOSPITAL_COMMUNITY): Payer: Medicare Other | Admitting: Anesthesiology

## 2020-09-28 ENCOUNTER — Encounter (HOSPITAL_COMMUNITY): Payer: Self-pay | Admitting: Internal Medicine

## 2020-09-28 DIAGNOSIS — R112 Nausea with vomiting, unspecified: Secondary | ICD-10-CM | POA: Diagnosis not present

## 2020-09-28 HISTORY — PX: BIOPSY: SHX5522

## 2020-09-28 HISTORY — PX: ESOPHAGOGASTRODUODENOSCOPY: SHX5428

## 2020-09-28 LAB — CULTURE, BLOOD (ROUTINE X 2)
Culture: NO GROWTH
Culture: NO GROWTH
Special Requests: ADEQUATE
Special Requests: ADEQUATE

## 2020-09-28 LAB — RENAL FUNCTION PANEL
Albumin: 3.1 g/dL — ABNORMAL LOW (ref 3.5–5.0)
Anion gap: 7 (ref 5–15)
BUN: 17 mg/dL (ref 8–23)
CO2: 23 mmol/L (ref 22–32)
Calcium: 10.5 mg/dL — ABNORMAL HIGH (ref 8.9–10.3)
Chloride: 107 mmol/L (ref 98–111)
Creatinine, Ser: 1.89 mg/dL — ABNORMAL HIGH (ref 0.44–1.00)
GFR, Estimated: 27 mL/min — ABNORMAL LOW (ref >60)
Glucose, Bld: 92 mg/dL (ref 70–99)
Phosphorus: 2.7 mg/dL (ref 2.5–4.6)
Potassium: 3.3 mmol/L — ABNORMAL LOW (ref 3.5–5.1)
Sodium: 137 mmol/L (ref 135–145)

## 2020-09-28 LAB — GLUCOSE, CAPILLARY
Glucose-Capillary: 93 mg/dL (ref 70–99)
Glucose-Capillary: 124 mg/dL — ABNORMAL HIGH (ref 70–99)
Glucose-Capillary: 130 mg/dL — ABNORMAL HIGH (ref 70–99)
Glucose-Capillary: 100 mg/dL — ABNORMAL HIGH (ref 70–99)

## 2020-09-28 LAB — MAGNESIUM: Magnesium: 2 mg/dL (ref 1.7–2.4)

## 2020-09-28 SURGERY — EGD (ESOPHAGOGASTRODUODENOSCOPY)
Anesthesia: Monitor Anesthesia Care

## 2020-09-28 MED ORDER — PANTOPRAZOLE SODIUM 40 MG PO TBEC
40.0000 mg | DELAYED_RELEASE_TABLET | Freq: Every day | ORAL | Status: DC
  Administered 2020-09-29: 40 mg via ORAL
  Filled 2020-09-28: qty 1

## 2020-09-28 MED ORDER — LACTATED RINGERS IV SOLN: INTRAVENOUS | Status: DC | PRN

## 2020-09-28 MED ORDER — POTASSIUM CHLORIDE 10 MEQ/100ML IV SOLN
10.0000 meq | INTRAVENOUS | Status: AC
  Administered 2020-09-28 (×4): 10 meq via INTRAVENOUS
  Filled 2020-09-28 (×3): qty 100

## 2020-09-28 MED ORDER — PROPOFOL 10 MG/ML IV BOLUS
INTRAVENOUS | Status: DC | PRN
  Administered 2020-09-28 (×3): 20 mg via INTRAVENOUS
  Administered 2020-09-28: 40 mg via INTRAVENOUS

## 2020-09-28 MED ORDER — SODIUM CHLORIDE 0.9 % IV SOLN: INTRAVENOUS | Status: DC

## 2020-09-28 MED ORDER — LIDOCAINE 2% (20 MG/ML) 5 ML SYRINGE
INTRAMUSCULAR | Status: DC | PRN
  Administered 2020-09-28: 40 mg via INTRAVENOUS

## 2020-09-28 MED ORDER — POTASSIUM CHLORIDE 10 MEQ/100ML IV SOLN
10.0000 meq | INTRAVENOUS | Status: DC
  Filled 2020-09-28: qty 100

## 2020-09-28 NOTE — Transfer of Care (Signed)
Immediate Anesthesia Transfer of Care Note  Patient: Melissa Lopez  Procedure(s) Performed: ESOPHAGOGASTRODUODENOSCOPY (EGD) (N/A ) BIOPSY  Patient Location: PACU and Endoscopy Unit  Anesthesia Type:MAC  Level of Consciousness: awake and alert   Airway & Oxygen Therapy: Patient Spontanous Breathing and Patient connected to face mask oxygen  Post-op Assessment: Report given to RN and Post -op Vital signs reviewed and stable  Post vital signs: Reviewed and stable  Last Vitals:  Vitals Value Taken Time  BP    Temp    Pulse    Resp    SpO2      Last Pain:  Vitals:   09/28/20 0800  TempSrc: Oral  PainSc: 0-No pain      Patients Stated Pain Goal: 2 (22/02/54 2706)  Complications: No complications documented.

## 2020-09-28 NOTE — Progress Notes (Signed)
The nursing staff received a call from radiology, recommending that the patient's nuclear medicine gastric emptying scan be done as an outpatient, for better results, due to patient mobility issues, potential for medication interactions with the test, etc.  Revised plan:  1. I have canceled the gastric emptying scan for tomorrow.    2.  If the patient tolerates a soft diet, I think discharge late today or tomorrow would be appropriate.    3. I will contact the patient with her biopsy results, when available.  4.  If the patient has recurrent symptoms while in-house, she may need to go back on metoclopramide and/or pain medication.  Due to risk of side effects, I do not think I would have this patient on chronic metoclopramide unless she remains significantly symptomatic as an outpatient off the medication, and derives substantial symptomatic benefit from being on that medication.  5.  The patient should have follow-up with her primary physician, with the recommendation for a gastric emptying scan as an outpatient, as well as colon cancer screening as they see appropriate.  Follow-up with GI is not necessary, but if GI follow-up is felt to be desired by the primary physician, it might be most appropriate for her to be arranged in [place], where the patient resides.  However, we at [facility] are available if needed.  Cleotis Nipper, M.D. Pager [phone] If no answer or after 5 PM call [phone]

## 2020-09-28 NOTE — Anesthesia Procedure Notes (Signed)
Procedure Name: MAC Date/Time: 09/28/2020 8:42 AM Performed by: Cynda Familia, CRNA Pre-anesthesia Checklist: Patient identified, Emergency Drugs available, Suction available, Patient being monitored and Timeout performed Patient Re-evaluated:Patient Re-evaluated prior to induction Oxygen Delivery Method: Simple face mask Placement Confirmation: positive ETCO2 and breath sounds checked- equal and bilateral Dental Injury: Teeth and Oropharynx as per pre-operative assessment  Comments: Bite block by RN-- pt reported loose teeth on bottom preop- RN aware

## 2020-09-28 NOTE — Op Note (Signed)
[facility] Patient Name: Melissa Lopez Procedure Date: 09/28/2020 MRN: [id number] Attending MD: Ronald Lobo , MD Date of Birth: Jul 17, 1951 CSN: [id number] Age: 69 Admit Type: Inpatient Procedure:                Upper GI endoscopy Indications:              Nausea with vomiting (episodic) in a diabetic                            patient Providers:                Ronald Lobo, MD, Cleda Daub, RN, Cherylynn Ridges, Technician, Glenis Smoker, CRNA Referring MD:              Medicines:                Monitored Anesthesia Care Complications:            No immediate complications. Estimated Blood Loss:     Estimated blood loss was minimal. Procedure:                Pre-Anesthesia Assessment:                           - Prior to the procedure, a History and Physical                            was performed, and patient medications and                            allergies were reviewed. The patient's tolerance of                            previous anesthesia was also reviewed. The risks                            and benefits of the procedure and the sedation                            options and risks were discussed with the patient.                            All questions were answered, and informed consent                            was obtained. Prior Anticoagulants: The patient has                            taken no previous anticoagulant or antiplatelet                            agents. ASA Grade Assessment: III - A patient with  severe systemic disease. After reviewing the risks                            and benefits, the patient was deemed in                            satisfactory condition to undergo the procedure.                           After obtaining informed consent, the endoscope was                            passed under direct vision. Throughout the                            procedure,  the patient's blood pressure, pulse, and                            oxygen saturations were monitored continuously. The                            GIF-H190 ([id number]) Olympus gastroscope was                            introduced through the mouth, and advanced to the                            second part of duodenum. The upper GI endoscopy was                            accomplished without difficulty. The patient                            tolerated the procedure well. Scope In: Scope Out: Findings:      The larynx was normal.      The examined esophagus was normal.      There is no endoscopic evidence of hiatal hernia in the distal esophagus.      Bilious fluid (small to moderate amount) was found on the greater       curvature of the stomach. There was also a particle of retained       vegetable debris in the fundus (despite no solid food in days).      Striped mildly erythematous mucosa without bleeding was found in the       gastric antrum. Biopsies were taken with a cold forceps for histology.      The exam of the stomach was otherwise normal.      There is no endoscopic evidence of ulceration, pyloric stenosis or mass       in the stomach.      The cardia and gastric fundus were normal on retroflexion.      The examined duodenum was normal. Biopsies were taken with a cold       forceps for histology. Estimated blood loss was minimal. Impression:               - Normal larynx.                           -  Normal esophagus.                           - Bilious gastric fluid.                           - Erythematous mucosa in the antrum. Biopsied.                           - Normal examined duodenum. Biopsied.                           - The presence of bile reflux suggests gastric                            dysmotility or bile reflux gastritis as possible                            causes for the patient's symptoms. Moderate Sedation:      This patient was sedated with monitored  anesthesia care, not moderate       sedation. Recommendation:           - Advance diet as tolerated. Procedure Code(s):        --- Professional ---                           [phone], Esophagogastroduodenoscopy, flexible,                            transoral; with biopsy, single or multiple Diagnosis Code(s):        --- Professional ---                           K31.89, Other diseases of stomach and duodenum                           R11.2, Nausea with vomiting, unspecified CPT copyright 2019 American Medical Association. All rights reserved. The codes documented in this report are preliminary and upon coder review may  be revised to meet current compliance requirements. Ronald Lobo, MD 09/28/2020 9:18:08 AM This report has been signed electronically. Number of Addenda: 0

## 2020-09-28 NOTE — Progress Notes (Signed)
Pt received in transfer from [facility] on 4E. VS stable, pt has no complaints at this time. I agree with previously charted assessment for this shift. Hortencia Conradi RN

## 2020-09-28 NOTE — Anesthesia Preprocedure Evaluation (Signed)
Anesthesia Evaluation  Patient identified by MRN, date of birth, ID band Patient awake    Reviewed: Allergy & Precautions, NPO status , Patient's Chart, lab work & pertinent test results, reviewed documented beta blocker date and time   Airway Mallampati: II  TM Distance: >3 FB Neck ROM: Full    Dental no notable dental hx. (+) Poor Dentition, Dental Advisory Given, Loose, Edentulous Upper,    Pulmonary former smoker,    Pulmonary exam normal breath sounds clear to auscultation       Cardiovascular hypertension, Pt. on medications and Pt. 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)  Rhythm:Regular Rate:Normal     Neuro/Psych negative neurological ROS  negative psych ROS   GI/Hepatic Neg liver ROS, GERD  Medicated and Controlled,Intractable N/V, malnutrition Last episode of vomiting yesterday, none this morning   Endo/Other  diabetes, Type 2, Insulin Dependenta1c 6.5  Renal/GU Renal InsufficiencyRenal diseaseCKD3, Cr 1.9  negative genitourinary   Musculoskeletal  (+) Arthritis , Osteoarthritis,    Abdominal Normal abdominal exam  (+)   Peds negative pediatric ROS (+)  Hematology negative hematology ROS (+)   Anesthesia Other Findings   Reproductive/Obstetrics negative OB ROS                             Anesthesia Physical Anesthesia Plan  ASA: III  Anesthesia Plan: MAC   Post-op Pain Management:    Induction:   PONV Risk Score and Plan: 2 and Propofol infusion and TIVA  Airway Management Planned: Natural Airway and Simple Face Mask  Additional Equipment: None  Intra-op Plan:   Post-operative Plan:   Informed Consent: I have reviewed the patients History and Physical, chart, labs and discussed the procedure including the risks, benefits and alternatives for the proposed anesthesia with the patient or  authorized representative who has indicated his/her understanding and acceptance.       Plan Discussed with: CRNA  Anesthesia Plan Comments:         Anesthesia Quick Evaluation

## 2020-09-28 NOTE — Progress Notes (Signed)
Report called to [facility] on 6E. Pt transported off unit.

## 2020-09-28 NOTE — Interval H&P Note (Signed)
History and Physical Interval Note:  09/28/2020 8:39 AM  Melissa Lopez  has presented today for surgery, with the diagnosis of intractable nausea and vomiting.  The various methods of treatment have been discussed with the patient. After consideration of risks, benefits and other options for treatment, the patient has consented to  Procedure(s): ESOPHAGOGASTRODUODENOSCOPY (EGD) (N/A) as a surgical intervention.  The patient's history has been reviewed, patient examined, no change in status, stable for surgery.  I have reviewed the patient's chart and labs.  Questions were answered to the patient's satisfaction.     Youlanda Mighty Draylen Lobue

## 2020-09-28 NOTE — Anesthesia Postprocedure Evaluation (Signed)
Anesthesia Post Note  Patient: Melissa Lopez  Procedure(s) Performed: ESOPHAGOGASTRODUODENOSCOPY (EGD) (N/A ) BIOPSY     Patient location during evaluation: PACU Anesthesia Type: MAC Level of consciousness: awake and alert Pain management: pain level controlled Vital Signs Assessment: post-procedure vital signs reviewed and stable Respiratory status: spontaneous breathing, nonlabored ventilation and respiratory function stable Cardiovascular status: blood pressure returned to baseline and stable Postop Assessment: no apparent nausea or vomiting Anesthetic complications: no   No complications documented.  Last Vitals:  Vitals:   09/28/20 0908 09/28/20 0920  BP: (!) 137/59 (!) 153/90  Pulse: (!) 59 60  Resp: 17 16  Temp: 36.6 C   SpO2: 100% 99%    Last Pain:  Vitals:   09/28/20 0920  TempSrc:   PainSc: 0-No pain                 Pervis Hocking

## 2020-09-28 NOTE — Progress Notes (Addendum)
Patient's endoscopy was well-tolerated, and shows bile reflux, consistent with underlying gastric dysmotility, perhaps related to her diabetes.  However, the patient indicates she is feeling better today and is asking for food.  Plan:  1.  I will place the patient on a soft diet  2.  I have ordered a gastric emptying scan for tomorrow.  In anticipation of that, I have stopped the patient's metoclopramide and morphine, which could interfere with the results of that test.  We will have to see if the patient can tolerate being off those medications.  3.  Depending on the patient's clinical evolution and the results of her gastric biopsies, treatment with sucralfate for bile reflux gastritis might be appropriate, but I do not plan to start it at this time.  Cleotis Nipper, M.D. Pager [phone] If no answer or after 5 PM call [phone]

## 2020-09-28 NOTE — Progress Notes (Signed)
Notified by [employer] in Imaging that the gastric emptying procedure is best done outpatient as results may not be correct DT: decreased mobility and medications interactions, MD notified and will cancel order and recommend f/u at discharge

## 2020-09-28 NOTE — Progress Notes (Signed)
PROGRESS NOTE   Melissa Lopez  GDJ:[id number]    DOB: 02-13-1951    DOA: 09/22/2020  PCP: Celene Squibb, MD   I have briefly reviewed patients previous medical records in [facility].  Chief Complaint  Patient presents with  . Hematemesis    Brief Narrative:  69 year old female with PMH of IDDM, HTN, chronic diastolic CHF, symptomatic bradycardia s/p pacemaker, chronic kidney disease stage IIIb, presented to the ED with complaints of abdominal pain and vomiting of approximately 1 week duration which progressively worsened and she was unable to keep anything down.  She was admitted for intractable nausea, vomiting and electrolyte abnormalities (hypercalcemia, hypokalemia, hypophosphatemia).  S/p EGD 10/21.   Assessment & Plan:  Principal Problem:   Hyperparathyroidism, primary ([place]) Active Problems:   Type 2 diabetes mellitus with complication, with long-term current use of insulin (HCC)   CKD (chronic kidney disease) stage 3, GFR 30-59 ml/min (HCC) - per patient report   Essential hypertension   Nausea with vomiting   Hypercalcemia   Hypokalemia   Chronic diastolic CHF (congestive heart failure) (HCC)   Emesis   Hypophosphatemia   Intractable nausea, vomiting and abdominal pain:  Unclear etiology.  CT abdomen and pelvis without acute findings.  Patient reports intermittent symptoms of almost 1 year duration which recently worsened.  EGD in system in 2019 showed gastritis.  Reports she has never had a colonoscopy.  Initially felt to be due to hypercalcemia and constipation.  However despite treatment of these in the hospital, continues to have nausea and vomiting.  Thereby GI consulted on 10/20.  Other DD include possible diabetic gastroparesis, gastritis, esophagitis, PUD versus others.  GI input appreciated and there DD: Diabetic gastroparesis, bile reflux gastritis, THC induced nausea and vomiting or less likely an anatomic lesion such as acquired pyloric  stenosis, ulcer disease or neoplasia.  They plan EGD.  They defer screening colonoscopy to outpatient follow-up with PCP.  Status post EGD 10/21 which showed findings suggestive of bile reflux suggesting gastric dysmotility or bile reflux gastritis.  Per GI, back on soft diet, if tolerates then can DC home, possibly tomorrow given recurrence of her nausea and vomiting in the past.  GI will contact patient with biopsy results.  Opioids and metoclopramide were discontinued.  GI indicates no chronic metoclopramide given risk of side effects and unless she remains significantly symptomatic as an outpatient off the medication.  Outpatient follow-up with her PCP to arrange GES and colon cancer screening as outpatient with GI in [place], close to where patient lives.  Chronic constipation:  Patient reported to GI that she takes MiraLAX on a daily basis but despite that she requires a suppository every several days to have a BM.  S/p sorbitol and soapsuds enema with good results.  Increased MiraLAX to twice daily.  Colon cancer screening as outpatient through PCP.  Primary hyperparathyroidism/hypercalcemia secondary to primary hyperparathyroidism:  Hypercalcemia possibly also due to dehydration on admission.  She does have history of stage III CKD.  On 09/22/2020: Intact PTH 85, total calcium: 13.5.  TSH normal/2.207.  25-hydroxy vitamin D: 20.14.  Negative parathyroid scan.  Corrected serum calcium on 10/21: 11.2.  Phosphorus and magnesium normal.  Patient reported that she was evaluated in [place] in the past and noted to have a small nodule on her parathyroid gland and surveillance was recommended.  Recommend outpatient follow-up with PCP, general surgery.  Consider discussing with nephrology tomorrow.  Hypophosphatemia:  Replaced.  Phosphorus 2.7.  Hypokalemia  Replaced IV today.  Follow BMP in a.m.  Magnesium normal.  Stage IIIb chronic kidney disease:  Creatinine down to 1.89,  likely close to her baseline.  Essential hypertension:  Controlled.  Continue amlodipine, carvedilol, hydralazine and lisinopril.  Type II DM with renal complications  Last S2G 6.5, well controlled.  Reasonably controlled on low-dose Lantus and SSI.  Continue gabapentin for neuropathy.  Chronic diastolic CHF/history of symptomatic bradycardia s/p permanent pacemaker.  Compensated and does not clinically appear volume overloaded.  Continue cardiac meds as below.  Body mass index is 26.79 kg/m.  Nutritional Status Nutrition Problem: Increased nutrient needs Etiology: acute illness Signs/Symptoms: estimated needs Interventions: Boost Breeze, MVI, Other (Comment) (Prosource Plus)  DVT prophylaxis: SCDs Start: 09/22/20 1958     Code Status: Full Code Family Communication: None at bedside Disposition:  Status is: Inpatient  Remains inpatient appropriate because:Inpatient level of care appropriate due to severity of illness   Dispo: The patient is from: Home              Anticipated d/c is to: Home              Anticipated d/c date is: 1 day              Patient currently is not medically stable to d/c.        Consultants:   [facility]  Procedures:   EGD 09/28/2020:  Impression: - Normal larynx. - Normal esophagus. - Bilious gastric fluid. - Erythematous mucosa in the antrum. Biopsied. - Normal examined duodenum. Biopsied. - The presence of bile reflux suggests gastric dysmotility or bile reflux gastritis as possible causes for the patient's symptoms.    Antimicrobials:    Anti-infectives (From admission, onward)   None        Subjective:  Patient seen post procedure this morning.  "I am hungry".  Denies nausea, vomiting or abdominal pain overnight.  No other complaints reported.  Objective:   Vitals:   09/28/20 0908 09/28/20 0920 09/28/20 1155 09/28/20 1310  BP: (!) 137/59 (!) 153/90 136/83   Pulse: (!) 59 60 60   Resp: 17 16 14 12   Temp: 97.8 F  (36.6 C)  98.7 F (37.1 C)   TempSrc: Oral  Oral   SpO2: 100% 99% 98%   Weight:      Height:        General exam: Pleasant middle-aged female, moderately built and nourished sitting up comfortably in chair this morning.  Oral mucosa moist. Respiratory system: Clear to auscultation. Respiratory effort normal. Cardiovascular system: S1 & S2 heard, RRR. No JVD, murmurs, rubs, gallops or clicks. No pedal edema.  Telemetry personally reviewed: AV paced rhythm. Gastrointestinal system: Abdomen is nondistended, soft and nontender. No organomegaly or masses felt. Normal bowel sounds heard. Central nervous system: Alert and oriented. No focal neurological deficits. Extremities: Symmetric 5 x 5 power. Skin: No rashes, lesions or ulcers Psychiatry: Judgement and insight appear normal. Mood & affect appropriate.     Data Reviewed:   I have personally reviewed following labs and imaging studies   CBC: Recent Labs  Lab 09/22/20 1228 09/22/20 1228 09/24/20 0658 09/25/20 0515 09/26/20 0508  WBC 24.4*   < > 15.6* 13.3* 9.9  NEUTROABS 21.3*  --  11.1*  --  6.0  HGB 17.1*   < > 13.8 14.1 13.5  HCT 47.6*   < > 39.8 40.2 39.4  MCV 86.2   < > 89.4 89.7 90.4  PLT 273   < >  188 166 164   < > = values in this interval not displayed.    Basic Metabolic Panel: Recent Labs  Lab 09/25/20 0515 09/25/20 0515 09/26/20 0508 09/27/20 0517 09/28/20 0504  NA 135   < > 136 135 137  K 3.2*   < > 3.7 4.4 3.3*  CL 103   < > 105 106 107  CO2 22   < > 23 22 23   GLUCOSE 112*   < > 101* 111* 92  BUN 33*   < > 25* 18 17  CREATININE 2.09*   < > 1.99* 1.99* 1.89*  CALCIUM 10.6*   < > 10.8* 10.7* 10.5*  MG 2.1  --  2.0  --  2.0  PHOS 2.8   < > 2.4* 3.0 2.7   < > = values in this interval not displayed.    Liver Function Tests: Recent Labs  Lab 09/22/20 1228 09/24/20 0644 09/26/20 0508 09/27/20 0517 09/28/20 0504  AST 17  --   --   --   --   ALT 16  --   --   --   --   ALKPHOS 122  --   --    --   --   BILITOT 1.0  --   --   --   --   PROT 8.5*  --   --   --   --   ALBUMIN 4.9   < > 3.1* 3.2* 3.1*   < > = values in this interval not displayed.    CBG: Recent Labs  Lab 09/27/20 2118 09/28/20 0729 09/28/20 1153  GLUCAP 97 93 124*    Microbiology Studies:   Recent Results (from the past 240 hour(s))  Respiratory Panel by RT PCR (Flu A&B, Covid) - Nasopharyngeal Swab     Status: None   Collection Time: 09/22/20  8:25 PM   Specimen: Nasopharyngeal Swab  Result Value Ref Range Status   SARS Coronavirus 2 by RT PCR NEGATIVE NEGATIVE Final    Comment: (NOTE) SARS-CoV-2 target nucleic acids are NOT DETECTED.  The SARS-CoV-2 RNA is generally detectable in upper respiratoy specimens during the acute phase of infection. The lowest concentration of SARS-CoV-2 viral copies this assay can detect is 131 copies/mL. A negative result does not preclude SARS-Cov-2 infection and should not be used as the sole basis for treatment or other patient management decisions. A negative result may occur with  improper specimen collection/handling, submission of specimen other than nasopharyngeal swab, presence of viral mutation(s) within the areas targeted by this assay, and inadequate number of viral copies (<131 copies/mL). A negative result must be combined with clinical observations, patient history, and epidemiological information. The expected result is Negative.  Fact Sheet for Patients:  PinkCheek.be  Fact Sheet for Healthcare Providers:  GravelBags.it  This test is no t yet approved or cleared by the [place] FDA and  has been authorized for detection and/or diagnosis of SARS-CoV-2 by FDA under an Emergency Use Authorization (EUA). This EUA will remain  in effect (meaning this test can be used) for the duration of the COVID-19 declaration under Section 564(b)(1) of the Act, 21 U.S.C. section 360bbb-3(b)(1), unless  the authorization is terminated or revoked sooner.     Influenza A by PCR NEGATIVE NEGATIVE Final   Influenza B by PCR NEGATIVE NEGATIVE Final    Comment: (NOTE) The Xpert Xpress SARS-CoV-2/FLU/RSV assay is intended as an aid in  the diagnosis of influenza from Nasopharyngeal swab specimens and  should not be used as a sole basis for treatment. Nasal washings and  aspirates are unacceptable for Xpert Xpress SARS-CoV-2/FLU/RSV  testing.  Fact Sheet for Patients: PinkCheek.be  Fact Sheet for Healthcare Providers: GravelBags.it  This test is not yet approved or cleared by the [place] FDA and  has been authorized for detection and/or diagnosis of SARS-CoV-2 by  FDA under an Emergency Use Authorization (EUA). This EUA will remain  in effect (meaning this test can be used) for the duration of the  Covid-19 declaration under Section 564(b)(1) of the Act, 21  U.S.C. section 360bbb-3(b)(1), unless the authorization is  terminated or revoked. Performed at [facility], [address] [place], [place] [address]   Culture, Urine     Status: Abnormal   Collection Time: 09/23/20  8:32 AM   Specimen: Urine, Catheterized  Result Value Ref Range Status   Specimen Description   Final    URINE, CATHETERIZED Performed at [place] [address] [place], [place] [address]    Special Requests   Final    NONE Performed at [facility], [place] [address], [place], [place] [address]    Culture (A)  Final    30,000 COLONIES/mL MULTIPLE SPECIES PRESENT, SUGGEST RECOLLECTION   Report Status 09/24/2020 FINAL  Final  Culture, blood (Routine X 2) w Reflex to ID Panel     Status: None   Collection Time: 09/23/20 11:23 AM   Specimen: BLOOD RIGHT ARM  Result Value Ref Range Status   Specimen Description   Final    BLOOD RIGHT ARM Performed at [facility], [place] [address], [place], [place]  [address]    Special Requests   Final    BOTTLES DRAWN AEROBIC AND ANAEROBIC Blood Culture adequate volume Performed at [place] [address], [place], [place] [address]    Culture   Final    NO GROWTH 5 DAYS Performed at [facility], [place] [address], [place], [place] [address]    Report Status 09/28/2020 FINAL  Final  Culture, blood (Routine X 2) w Reflex to ID Panel     Status: None   Collection Time: 09/23/20 11:24 AM   Specimen: BLOOD RIGHT ARM  Result Value Ref Range Status   Specimen Description   Final    BLOOD RIGHT ARM Performed at [facility], [place] [address], [place], [place] [address]    Special Requests   Final    BOTTLES DRAWN AEROBIC AND ANAEROBIC Blood Culture adequate volume Performed at [place] [address] [place], [place] [address]    Culture   Final    NO GROWTH 5 DAYS Performed at [facility], [place] [address], [place], [place] [address]    Report Status 09/28/2020 FINAL  Final     Radiology Studies:  No results found.   Scheduled Meds:   . (feeding supplement) PROSource Plus  30 mL Oral Daily  . amLODipine  10 mg Oral Daily  . buPROPion  150 mg Oral Daily  . carvedilol  6.25 mg Oral BID  . escitalopram  20 mg Oral Daily  . feeding supplement  1 Container Oral TID BM  . gabapentin  100 mg Oral TID  . hydrALAZINE  25 mg Oral Daily  . insulin aspart  0-15 Units Subcutaneous TID WC  . insulin aspart  0-5 Units Subcutaneous QHS  . insulin glargine  5 Units Subcutaneous Daily  .  lisinopril  20 mg Oral Daily  . multivitamin with minerals  1 tablet Oral Daily  . oxybutynin  10 mg Oral QHS  . pantoprazole (PROTONIX) IV  40 mg Intravenous Q12H  . polyethylene glycol  17 g Oral BID  . rosuvastatin  5 mg Oral Daily  . senna-docusate  1 tablet Oral BID  . traZODone  100 mg Oral QHS    Continuous Infusions:      LOS: 5 days     Vernell Leep, MD, [place], [facility]. [facility]    To contact the attending provider between 7A-7P or the covering provider during after hours 7P-7A, please log into the web site [email or web address] and access using universal [place] password for that web site. If you do not have the password, please call the hospital operator.  09/28/2020, 4:16 PM

## 2020-09-29 ENCOUNTER — Other Ambulatory Visit: Payer: Self-pay

## 2020-09-29 ENCOUNTER — Other Ambulatory Visit: Payer: Self-pay | Admitting: Internal Medicine

## 2020-09-29 ENCOUNTER — Encounter (HOSPITAL_COMMUNITY): Payer: Self-pay | Admitting: Gastroenterology

## 2020-09-29 ENCOUNTER — Other Ambulatory Visit: Payer: Self-pay | Admitting: Family Medicine

## 2020-09-29 DIAGNOSIS — E210 Primary hyperparathyroidism: Secondary | ICD-10-CM | POA: Diagnosis not present

## 2020-09-29 DIAGNOSIS — N1832 Chronic kidney disease, stage 3b: Secondary | ICD-10-CM | POA: Diagnosis not present

## 2020-09-29 DIAGNOSIS — R112 Nausea with vomiting, unspecified: Secondary | ICD-10-CM | POA: Diagnosis not present

## 2020-09-29 DIAGNOSIS — I5032 Chronic diastolic (congestive) heart failure: Secondary | ICD-10-CM | POA: Diagnosis not present

## 2020-09-29 LAB — GLUCOSE, CAPILLARY
Glucose-Capillary: 122 mg/dL — ABNORMAL HIGH (ref 70–99)
Glucose-Capillary: 136 mg/dL — ABNORMAL HIGH (ref 70–99)

## 2020-09-29 LAB — RENAL FUNCTION PANEL
Albumin: 3.3 g/dL — ABNORMAL LOW (ref 3.5–5.0)
Anion gap: 7 (ref 5–15)
BUN: 17 mg/dL (ref 8–23)
CO2: 25 mmol/L (ref 22–32)
Calcium: 11.2 mg/dL — ABNORMAL HIGH (ref 8.9–10.3)
Chloride: 106 mmol/L (ref 98–111)
Creatinine, Ser: 1.74 mg/dL — ABNORMAL HIGH (ref 0.44–1.00)
GFR, Estimated: 32 mL/min — ABNORMAL LOW (ref >60)
Glucose, Bld: 97 mg/dL (ref 70–99)
Phosphorus: 2.2 mg/dL — ABNORMAL LOW (ref 2.5–4.6)
Potassium: 3.5 mmol/L (ref 3.5–5.1)
Sodium: 138 mmol/L (ref 135–145)

## 2020-09-29 LAB — URINALYSIS, ROUTINE W REFLEX MICROSCOPIC
Bilirubin Urine: NEGATIVE
Glucose, UA: 50 mg/dL — AB
Ketones, ur: NEGATIVE mg/dL
Nitrite: NEGATIVE
Protein, ur: NEGATIVE mg/dL
RBC / HPF: >50 RBC/hpf — ABNORMAL HIGH (ref 0–5)
Specific Gravity, Urine: 1.008 (ref 1.005–1.030)
pH: 8 (ref 5.0–8.0)

## 2020-09-29 LAB — SURGICAL PATHOLOGY

## 2020-09-29 MED ORDER — BISACODYL 10 MG RE SUPP
10.0000 mg | Freq: Once | RECTAL | Status: AC
  Administered 2020-09-29: 10 mg via RECTAL
  Filled 2020-09-29: qty 1

## 2020-09-29 MED ORDER — K PHOS MONO-SOD PHOS DI & MONO 155-852-130 MG PO TABS
500.0000 mg | ORAL_TABLET | Freq: Once | ORAL | Status: DC
  Filled 2020-09-29: qty 2

## 2020-09-29 MED ORDER — ADULT MULTIVITAMIN W/MINERALS CH: 1.0000 | ORAL_TABLET | Freq: Every day | ORAL | Status: DC

## 2020-09-29 MED ORDER — PROSOURCE PLUS PO LIQD
30.0000 mL | Freq: Every day | ORAL | 0 refills | Status: DC
Start: 2020-09-29 — End: 2024-09-10

## 2020-09-29 MED ORDER — SENNOSIDES-DOCUSATE SODIUM 8.6-50 MG PO TABS
1.0000 | ORAL_TABLET | Freq: Two times a day (BID) | ORAL | 0 refills | Status: DC
Start: 2020-09-29 — End: 2020-10-10

## 2020-09-29 MED ORDER — POLYETHYLENE GLYCOL 3350 17 G PO PACK
17.0000 g | PACK | Freq: Two times a day (BID) | ORAL | 0 refills | Status: DC
Start: 2020-09-29 — End: 2021-06-19

## 2020-09-29 NOTE — TOC Transition Note (Signed)
Transition of Care [facility]) - CM/SW Discharge Note   Patient Details  Name: Melissa Lopez MRN: [id number] Date of Birth: 11-26-1951  Transition of Care [facility]) CM/SW Contact:  Lynnell Catalan, RN Phone Number: 09/29/2020, 2:26 PM   Clinical Narrative:    Pt from home with family. Choice offered for home health services and [facility] chosen. Liaison contacted for referral. 3in1 ordered and [employer] contacted for delivery of 3in1 to pt room.   Final next level of care: [place] Barriers to Discharge: No Barriers Identified   Patient Goals and CMS Choice Patient states their goals for this hospitalization and ongoing recovery are:: to go home CMS Medicare.gov Compare Post Acute Care list provided to:: Patient Choice offered to / list presented to : Patient  Discharge Placement                       Discharge Plan and Services                DME Arranged: 3-N-1 DME Agency: [employer] Date DME Agency Contacted: 09/29/20 Time DME Agency Contacted: 1100 Representative spoke with at DME Agency: [place]: PT, OT [place] Agency: [place] ([place]) Date [place]: 09/29/20 Time [place]: 1300 Representative spoke with at [place]: [place] ([place]) Interventions     Readmission Risk Interventions No flowsheet data found.

## 2020-09-29 NOTE — Discharge Summary (Signed)
Physician Discharge Summary  Melissa Lopez VQM:[id number] DOB: [birth year]  PCP: Celene Squibb, MD  Admitted from: Home Discharged to: Home  Admit date: 09/22/2020 Discharge date: 09/29/2020  Recommendations for Outpatient Follow-up:    Follow-up Information    Celene Squibb, MD. Schedule an appointment as soon as possible for a visit in 1 week(s).   Specialty: Internal Medicine Why: To be seen with repeat labs (CBC, BMP, Phos.  Recommend outpatient further evaluation for suspected primary hyperparathyroidism including endocrinology and surgical consultation.  Also recommend outpatient colon cancer screening and hematuria evaluation. Contact information: [place] [address] [phone]                Home Health:  [place] Orders (From admission, onward)    Start     Ordered   09/29/20 [place]  At discharge       Question:  To provide the following care/treatments  Answer:  PT   09/29/20 1309   09/25/20 [place]  At discharge       Question Answer Comment  To provide the following care/treatments PT   To provide the following care/treatments OT      09/25/20 0808           Equipment/Devices:     Durable Medical Equipment  (From admission, onward)         Start     Ordered   09/29/20 1002  For home use only DME 3 n 1  Once        09/29/20 1001           Discharge Condition: Improved and stable   Code Status: Full Code Diet recommendation:  Discharge Diet Orders (From admission, onward)    Start     Ordered   09/29/20 0000  Diet - low sodium heart healthy       Comments: Low residue/low fiber diet for a week and then gradually advance diet as tolerated   09/29/20 1309   09/29/20 0000  Diet Carb Modified        09/29/20 1309           Discharge Diagnoses:  Principal Problem:   Hyperparathyroidism, primary ([place]) Active Problems:   Type 2 diabetes mellitus with complication, with long-term current  use of insulin (HCC)   CKD (chronic kidney disease) stage 3, GFR 30-59 ml/min (HCC) - per patient report   Essential hypertension   Nausea with vomiting   Hypercalcemia   Hypokalemia   Chronic diastolic CHF (congestive heart failure) (HCC)   Emesis   Hypophosphatemia   Brief Summary: 69 year old female with PMH of IDDM, HTN, chronic diastolic CHF, symptomatic bradycardia s/p pacemaker, chronic kidney disease stage IIIb, presented to the ED with complaints of abdominal pain and vomiting of approximately 1 week duration which progressively worsened and she was unable to keep anything down.  She was admitted for intractable nausea, vomiting and electrolyte abnormalities (hypercalcemia, hypokalemia, hypophosphatemia).  S/p EGD 10/21.   Assessment & Plan:   Intractable nausea, vomiting and abdominal pain:  Unclear etiology.  After hospital evaluation, diabetic gastroparesis/gastric dysmotility or bile reflux gastritis and ongoing THC use were top on the list of differentials.  CT abdomen and pelvis without acute findings.  Patient reports intermittent symptoms of almost 1 year duration which recently worsened.  EGD in system in 2019 showed gastritis.  Reports she has never had a colonoscopy.  Initially felt to be due to  hypercalcemia and constipation.  However despite treatment of these in the hospital, continues to have nausea and vomiting.  Thereby GI consulted on 10/20.  Status post EGD 10/21 which showed findings suggestive of bile reflux suggesting gastric dysmotility or bile reflux gastritis.   GI follow-up by Dr. Cristina Gong appreciated.  Patient has tolerated soft diet without further nausea, vomiting or abdominal pain.  He feels that her recurrent vomiting could either be due to diabetic gastroparesis or to her frequent use of THC.  His recommendations are as follows:  He will contact the patient with her biopsy results when they are ready.  He has cleared her for discharge  home.  Because patient is on aspirin, he recommends continuing prior home dose of omeprazole 20 mg daily.  He recommends avoiding use of metoclopramide in this patient unless forced to do so as her case evolves over time.  1 week trial of low residue diet to see if that makes a difference and if it does then she could then try gradually reintroducing fruits and vegetables back into her diet.  He did not think that it was practical to try to get a gastric emptying scan on this patient because patient indicated that she cannot tolerate that test, which apparently had been tried on several occasions in the past.  Recommended discussing with her PCP regarding colon cancer screening options.  He thinks that she would probably be best served by stool studies rather than colonoscopy, because she might have a difficult time with the prep for colonoscopy.  If she does end up needing a colonoscopy, it might be easier for her to have it done in [place] where she resides.  No follow-up with his office was needed  Chronic constipation:  Patient reported to GI that she takes MiraLAX on a daily basis but despite that she requires a suppository every several days to have a BM.  S/p sorbitol and soapsuds enema with good results.  Increased MiraLAX to twice daily.  Also added senna.  Had a Dulcolax suppository today with small BM.  Suspect that her chronic hypercalcemia is contributing in addition to the diabetic gastroparesis.  Colon cancer screening as outpatient through PCP.  Primary hyperparathyroidism/hypercalcemia secondary to primary hyperparathyroidism:  Hypercalcemia possibly also due to dehydration on admission.  She does have history of stage III CKD.  On 09/22/2020: Intact PTH 85, total calcium: 13.5.  TSH normal/2.207.  25-hydroxy vitamin D: 20.14.  Negative parathyroid scan.  Corrected serum calcium on 10/21: 11.2.  Phosphorus and magnesium normal.  Patient reported that she was  evaluated in [place] in the past and noted to have a small nodule on her parathyroid gland and surveillance was recommended.  Recommend outpatient follow-up with PCP, general surgery.  As per discussion with patient and her daughter today, patient has this ongoing issue for the last 15 years and followed with PCP.  Corrected serum calcium today is 11.4-11.8.  Recommend continued outpatient follow-up with her PCP and consider endocrinology and surgical consultation as outpatient.  Hypophosphatemia:  Phosphorus 2.7, down to 2.2 today, replaced prior to discharge.  Outpatient follow-up.Marland Kitchen  Hypokalemia  Replaced.  Magnesium normal.  Follow BMP as outpatient.  Stage IIIb chronic kidney disease:  Creatinine down to 1.74, likely close to her baseline.  Essential hypertension:  Controlled on polypharmacy suggesting difficult to control hypertension.  Continue amlodipine, carvedilol, hydralazine and lisinopril.  Outpatient follow-up  Type II DM with renal complications  Last D7O 6.5, well controlled.  Reasonably controlled  in the hospital on low-dose Lantus and SSI.  Continue gabapentin for neuropathy.  At discharge resume prior home regimen  Anxiety and depression:  Patient on polypharmacy and apparently has been on these for years.  Outpatient follow-up with PCP.  EKG 12/21: QTC 439 ms.    Chronic diastolic CHF/history of symptomatic bradycardia s/p permanent pacemaker.  Compensated and does not clinically appear volume overloaded.  Continue cardiac meds as below.  Not on diuretics PTA.  Hematuria  Today patient reported some dark-colored urine,?  Blood in the urine without dysuria, fever, chills, abdominal pain.  Urine microscopy shows >50 RBC per hpf, rare bacteria and WBC 21-50.  Low index of suspicion for UTI in the absence of other symptoms, fever, chills or leukocytosis.  It appears that she did have catheterized urine specimen earlier this admission which showed 30,000  colonies of multiple species, not suggestive of UTI.  Unclear etiology.  Recommend outpatient follow-up, consider repeating urine microscopy in a couple of weeks or if it gets worse and if not resolve then consider further evaluation and management.  Likely renal cysts noted on noncontrasted CT abdomen and pelvis.  Outpatient follow-up.  Body mass index is 26.79 kg/m.  Nutritional Status Nutrition Problem: Increased nutrient needs Etiology: acute illness Signs/Symptoms: estimated needs Interventions: Boost Breeze, MVI, Other (Comment) (Prosource Plus)     Consultants:   [facility]  Procedures:   EGD 09/28/2020:  Impression: - Normal larynx. - Normal esophagus. - Bilious gastric fluid. - Erythematous mucosa in the antrum. Biopsied. - Normal examined duodenum. Biopsied. - The presence of bile reflux suggests gastric dysmotility or bile reflux gastritis as possible causes for the patient's symptoms.      Discharge Instructions  Discharge Instructions    Call MD for:  difficulty breathing, headache or visual disturbances   Complete by: As directed    Call MD for:  extreme fatigue   Complete by: As directed    Call MD for:  persistant dizziness or light-headedness   Complete by: As directed    Call MD for:  persistant nausea and vomiting   Complete by: As directed    Call MD for:  severe uncontrolled pain   Complete by: As directed    Call MD for:  temperature >100.4   Complete by: As directed    Diet - low sodium heart healthy   Complete by: As directed    Low residue/low fiber diet for a week and then gradually advance diet as tolerated   Diet Carb Modified   Complete by: As directed    Increase activity slowly   Complete by: As directed        Medication List    STOP taking these medications   metoCLOPramide 5 MG tablet Commonly known as: REGLAN     TAKE these medications   (feeding supplement) PROSource Plus liquid Take 30 mLs by mouth  daily.   amLODipine 10 MG tablet Commonly known as: NORVASC TAKE 1 TABLET BY MOUTH EVERY DAY *NEEDS APPOINTMENT* What changed: additional instructions   aspirin 81 MG chewable tablet Chew 1 tablet (81 mg total) by mouth daily.   buPROPion 150 MG 12 hr tablet Commonly known as: WELLBUTRIN SR Take 150 mg by mouth daily.   carvedilol 6.25 MG tablet Commonly known as: COREG Take 1 tablet (6.25 mg total) by mouth 2 (two) times daily.   escitalopram 20 MG tablet Commonly known as: LEXAPRO Take 20 mg by mouth daily.   gabapentin 100 MG capsule  Commonly known as: NEURONTIN Take 100 mg by mouth 3 (three) times daily.   hydrALAZINE 50 MG tablet Commonly known as: APRESOLINE Take 25 mg by mouth daily.   lisinopril 20 MG tablet Commonly known as: ZESTRIL Take 1 tablet (20 mg total) by mouth daily.   LORazepam 1 MG tablet Commonly known as: ATIVAN Take 1 mg by mouth daily as needed for anxiety.   multivitamin with minerals Tabs tablet Take 1 tablet by mouth daily. Start taking on: September 30, 2020   omeprazole 20 MG capsule Commonly known as: PRILOSEC Take 20 mg by mouth daily.   oxybutynin 10 MG 24 hr tablet Commonly known as: DITROPAN-XL Take 10 mg by mouth at bedtime.   polyethylene glycol 17 g packet Commonly known as: MIRALAX / GLYCOLAX Take 17 g by mouth 2 (two) times daily.   rosuvastatin 5 MG tablet Commonly known as: CRESTOR TAKE 1 TABLET BY MOUTH DAILY AT 6:00PM *NEEDS APPOINTMENT* What changed: See the new instructions.   senna-docusate 8.6-50 MG tablet Commonly known as: Senokot-S Take 1 tablet by mouth 2 (two) times daily.   simethicone 125 MG chewable tablet Commonly known as: MYLICON Chew 242-353 mg by mouth every 6 (six) hours as needed for flatulence.   traZODone 100 MG tablet Commonly known as: DESYREL Take 100 mg by mouth at bedtime.   Tyler Aas FlexTouch 100 UNIT/ML FlexTouch Pen Generic drug: insulin degludec Inject 5 Units into the skin  daily.      Allergies  Allergen Reactions  . Oxycodone-Acetaminophen Anaphylaxis  . Contrast Media [Iodinated Diagnostic Agents]     Contrast Dye - the one made out of shellfish, gives a really bad reaction - swelling  . No Known Allergies   . Oxycodone     Statused by Person:  EAST, CAMELIA(CE) on [phone]  . Xarelto  [Rivaroxaban] Swelling      Procedures/Studies: CT ABDOMEN PELVIS WO CONTRAST  Result Date: 09/22/2020 CLINICAL DATA:  Constipation with vomiting. History of colitis and contrast allergy. Suspected bowel obstruction. EXAM: CT ABDOMEN AND PELVIS WITHOUT CONTRAST TECHNIQUE: Multidetector CT imaging of the abdomen and pelvis was performed following the standard protocol without IV contrast. COMPARISON:  Renal ultrasound 11/01/2018 FINDINGS: Lower chest: Linear scarring or atelectasis at the left lung base. Patient has a pacemaker. There is no significant pleural or pericardial effusion. Aortic and coronary artery atherosclerosis noted. Hepatobiliary: There is moderate intrahepatic and extrahepatic biliary dilatation post cholecystectomy. The common hepatic duct measures up to 18 mm in diameter. No evidence of calcified choledocholithiasis. The liver otherwise appears unremarkable as imaged in the noncontrast state. Pancreas: Mildly atrophied. No evidence of pancreatic ductal dilatation, mass lesion or surrounding inflammation. Spleen: Normal in size without focal abnormality. Adrenals/Urinary Tract: Both adrenal glands appear normal. Both kidneys are within normal limits for size. There are innumerable low-density renal lesions bilaterally which are incompletely characterized without contrast, but likely cysts. Some of these are associated with thin septations. No evidence of hydronephrosis, perinephric soft tissue stranding or ureteral calculus. The bladder appears normal. Stomach/Bowel: A small amount of enteric contrast was administered. The stomach appears normal. There is no  small or large bowel distension, wall thickening or surrounding inflammatory change. Some high density material is present within the sigmoid colon. The appendix is not clearly visualized. There is no pericecal inflammation. Vascular/Lymphatic: There are no enlarged abdominal or pelvic lymph nodes. Aortic and branch vessel atherosclerosis without evidence of aneurysm. Reproductive: Hysterectomy.  No adnexal mass. Other: Postsurgical changes in the low  anterior abdominal wall. No evidence of hernia or ascites. Musculoskeletal: No acute or significant osseous findings. Mild degenerative changes in the lumbar spine with associated probable nonacute Schmorl's node formation in the inferior endplate of L3. IMPRESSION: 1. No acute findings or explanation for the patient's symptoms. No evidence of bowel obstruction or perforation. 2. Intrahepatic and extrahepatic biliary dilatation post cholecystectomy, likely physiologic. Correlation with liver function studies recommended. 3. Innumerable low-density renal lesions bilaterally, incompletely characterized without contrast, but likely cysts as correlated with prior ultrasound. Given history renal failure, findings suggest polycystic kidney disease. 4. Aortic Atherosclerosis (ICD10-I70.0). Electronically Signed   By: Richardean Sale M.D.   On: 09/22/2020 13:40   DG Chest 2 View  Result Date: 09/25/2020 CLINICAL DATA:  Hypercalcemia, shortness of breath EXAM: CHEST - 2 VIEW COMPARISON:  09/24/2019 FINDINGS: No focal consolidation. No pleural effusion or pneumothorax. Heart and mediastinal contours are unremarkable. Dual lead cardiac pacemaker. No acute osseous abnormality. IMPRESSION: No active cardiopulmonary disease. Electronically Signed   By: Kathreen Devoid   On: 09/25/2020 12:41   DG Chest 2 View  Result Date: 09/23/2020 CLINICAL DATA:  Chest pain EXAM: CHEST - 2 VIEW COMPARISON:  November 01, 2018 FINDINGS: Lungs are clear. Heart size and pulmonary vascularity  are normal. No adenopathy. Pacemaker leads are attached to the right atrium and right ventricle. No pneumothorax. No bone lesions. IMPRESSION: Pacemaker leads attached to right atrium and right ventricle. Heart size normal. Lungs clear. Electronically Signed   By: Lowella Grip III M.D.   On: 09/23/2020 11:34   NM Parathyroid W/Spect  Result Date: 09/25/2020 CLINICAL DATA:  Hyperparathyroidism EXAM: NM PARATHYROID SCINTIGRAPHY AND SPECT IMAGING TECHNIQUE: Following intravenous administration of radiopharmaceutical, early and 2-hour delayed planar images were obtained in the anterior projection. Delayed triplanar SPECT images were also obtained at 2 hours. RADIOPHARMACEUTICALS:  24.0 mCi Tc-16m Sestamibi IV COMPARISON:  None FINDINGS: Planar imaging: Normal initial distribution of tracer within the thyroid lobes, LEFT lobe slightly larger in size than RIGHT. Delayed image shows washout of tracer from both thyroid lobes with slightly asymmetric residual tracer at the mid LEFT lobe. SPECT imaging: No abnormal sestamibi retention identified at the expected positions of the parathyroid glands to suggest parathyroid adenoma. No ectopic localization of tracer in the mediastinum. IMPRESSION: Negative parathyroid scan. Electronically Signed   By: Lavonia Dana M.D.   On: 09/25/2020 14:29   ECHOCARDIOGRAM COMPLETE  Result Date: 09/14/2020    ECHOCARDIOGRAM REPORT   Patient Name:   MOMINA HUNTON Date of Exam: 09/14/2020 Medical Rec #:  [id number]        Height:       64.0 in Accession #:    [id number]       Weight:       167.0 lb Date of Birth:  04-Jul-1951        BSA:          1.812 m Patient Age:    44 years         BP:           100/60 mmHg Patient Gender: F                HR:           50 bpm. Exam Location:  [facility] Procedure: 2D Echo, Cardiac Doppler and Color Doppler Indications:     R06.02 SOB  History:         Patient has prior history of Echocardiogram examinations, most  recent 11/01/2018.  CAD and STEMI, Pacemaker, CKD,                  Arrythmias:SSS, Signs/Symptoms:Shortness of Breath and Syncope;                  Risk Factors:Hypertension, Diabetes, Dyslipidemia and Former                  Smoker.  Sonographer:     Jeneen Montgomery RDMS, RVT, RDCS Referring Phys:  [place]. Diagnosing Phys: Carlyle Dolly MD IMPRESSIONS  1. The endocardium in the apex is poorly visualized, cannot exclude possible apical hypokinesis and/or apical hypertrophy, consider contrast study. . Left ventricular ejection fraction, by estimation, is 55 to 60%. The left ventricle has normal function. Left ventricular endocardial border not optimally defined to evaluate regional wall motion. There is moderate left ventricular hypertrophy. Left ventricular diastolic parameters are consistent with Grade I diastolic dysfunction (impaired relaxation).  2. Right ventricular systolic function is normal. The right ventricular size is normal.  3. The mitral valve is normal in structure. No evidence of mitral valve regurgitation. No evidence of mitral stenosis.  4. The aortic valve is tricuspid. Aortic valve regurgitation is not visualized. No aortic stenosis is present.  5. The inferior vena cava is normal in size with greater than 50% respiratory variability, suggesting right atrial pressure of 3 mmHg. Comparison(s): Echocardiogram done 11/01/18 showed and EF of 55-60%. FINDINGS  Left Ventricle: The endocardium in the apex is poorly visualized, cannot exclude possible apical hypokinesis and/or apical hypertrophy, consider contrast study. Left ventricular ejection fraction, by estimation, is 55 to 60%. The left ventricle has normal function. Left ventricular endocardial border not optimally defined to evaluate regional wall motion. The left ventricular internal cavity size was normal in size. There is moderate left ventricular hypertrophy. Left ventricular diastolic parameters are consistent with Grade I diastolic  dysfunction (impaired relaxation). Normal left ventricular filling pressure. Right Ventricle: The right ventricular size is normal. No increase in right ventricular wall thickness. Right ventricular systolic function is normal. Left Atrium: Left atrial size was normal in size. Right Atrium: Right atrial size was normal in size. Pericardium: There is no evidence of pericardial effusion. Mitral Valve: The mitral valve is normal in structure. No evidence of mitral valve regurgitation. No evidence of mitral valve stenosis. Tricuspid Valve: The tricuspid valve is normal in structure. Tricuspid valve regurgitation is mild . No evidence of tricuspid stenosis. Aortic Valve: The aortic valve is tricuspid. Aortic valve regurgitation is not visualized. No aortic stenosis is present. Aortic valve mean gradient measures 2.8 mmHg. Aortic valve peak gradient measures 10.1 mmHg. Aortic valve area, by VTI measures 2.88  cm. Pulmonic Valve: The pulmonic valve was not well visualized. Pulmonic valve regurgitation is not visualized. No evidence of pulmonic stenosis. Aorta: The aortic root is normal in size and structure. Pulmonary Artery: Indeterminant PASP, inadequate TR jet. Venous: The inferior vena cava is normal in size with greater than 50% respiratory variability, suggesting right atrial pressure of 3 mmHg. IAS/Shunts: No atrial level shunt detected by color flow Doppler. Additional Comments: A pacer wire is visualized.  LEFT VENTRICLE PLAX 2D LVIDd:         4.51 cm     Diastology LVIDs:         2.96 cm     LV e' medial:    4.64 cm/s LV PW:         1.28 cm  LV E/e' medial:  12.0 LV IVS:        1.32 cm     LV e' lateral:   5.13 cm/s LVOT diam:     2.00 cm     LV E/e' lateral: 10.9 LV SV:         61 LV SV Index:   34 LVOT Area:     3.14 cm  LV Volumes (MOD) LV vol d, MOD A2C: 48.1 ml LV vol d, MOD A4C: 53.0 ml LV vol s, MOD A2C: 14.4 ml LV vol s, MOD A4C: 14.6 ml LV SV MOD A2C:     33.7 ml LV SV MOD A4C:     53.0 ml LV SV MOD  BP:      36.5 ml RIGHT VENTRICLE RV S prime:     10.30 cm/s TAPSE (M-mode): 1.6 cm LEFT ATRIUM             Index LA diam:        2.90 cm 1.60 cm/m LA Vol (A2C):   35.0 ml 19.32 ml/m LA Vol (A4C):   38.7 ml 21.36 ml/m LA Biplane Vol:         20.70 ml/m  AORTIC VALVE AV Area (Vmax):    2.14 cm AV Area (Vmean):   2.95 cm AV Area (VTI):     2.88 cm AV Vmax:           158.57 cm/s AV Vmean:          76.645 cm/s AV VTI:            0.213 m AV Peak Grad:      10.1 mmHg AV Mean Grad:      2.8 mmHg LVOT Vmax:         108.00 cm/s LVOT Vmean:        72.000 cm/s LVOT VTI:          0.195 m LVOT/AV VTI ratio: 0.92  AORTA Ao Root diam: 3.00 cm Ao Asc diam:  2.70 cm MITRAL VALVE MV Area (PHT):             SHUNTS MV Decel Time: 227 msec    Systemic VTI:  0.20 m MR Peak grad: 13.8 mmHg    Systemic Diam: 2.00 cm MR Vmax:      186.00 cm/s MV E velocity: 55.80 cm/s MV A velocity: 87.30 cm/s MV E/A ratio:  0.64 Carlyle Dolly MD Electronically signed by Carlyle Dolly MD Signature Date/Time: 09/14/2020/4:25:33 PM    Final       Subjective: Patient tolerated soft diet without nausea, vomiting or abdominal pain.  Reported some dark urine and questionable blood in urine this morning.  No dysuria, fever, chills.  Denies any other complaints.  Anxious to go home.  Discharge Exam:  Vitals:   09/28/20 2143 09/28/20 2313 09/29/20 0547 09/29/20 1222  BP: (!) 144/71 (!) 149/84 (!) 143/91 120/66  Pulse: 63 (!) 59 63 (!) 59  Resp: 14 16 16 20   Temp: 98.2 F (36.8 C) 99 F (37.2 C) 98.3 F (36.8 C) 98.1 F (36.7 C)  TempSrc: Oral Oral Oral Oral  SpO2: 97% 98% 96% 98%  Weight:      Height:        General exam: Pleasant middle-aged female, moderately built and nourished lying comfortably supine in bed.  Oral mucosa moist. Respiratory system: Clear to auscultation. Respiratory effort normal. Cardiovascular system: S1 & S2 heard, RRR. No JVD, murmurs, rubs, gallops or clicks. No  pedal edema.  Gastrointestinal system:  Abdomen is nondistended, soft and nontender. No organomegaly or masses felt. Normal bowel sounds heard. Central nervous system: Alert and oriented. No focal neurological deficits. Extremities: Symmetric 5 x 5 power. Skin: No rashes, lesions or ulcers Psychiatry: Judgement and insight appear normal. Mood & affect appropriate.      The results of significant diagnostics from this hospitalization (including imaging, microbiology, ancillary and laboratory) are listed below for reference.     Microbiology: Recent Results (from the past 240 hour(s))  Respiratory Panel by RT PCR (Flu A&B, Covid) - Nasopharyngeal Swab     Status: None   Collection Time: 09/22/20  8:25 PM   Specimen: Nasopharyngeal Swab  Result Value Ref Range Status   SARS Coronavirus 2 by RT PCR NEGATIVE NEGATIVE Final    Comment: (NOTE) SARS-CoV-2 target nucleic acids are NOT DETECTED.  The SARS-CoV-2 RNA is generally detectable in upper respiratoy specimens during the acute phase of infection. The lowest concentration of SARS-CoV-2 viral copies this assay can detect is 131 copies/mL. A negative result does not preclude SARS-Cov-2 infection and should not be used as the sole basis for treatment or other patient management decisions. A negative result may occur with  improper specimen collection/handling, submission of specimen other than nasopharyngeal swab, presence of viral mutation(s) within the areas targeted by this assay, and inadequate number of viral copies (<131 copies/mL). A negative result must be combined with clinical observations, patient history, and epidemiological information. The expected result is Negative.  Fact Sheet for Patients:  PinkCheek.be  Fact Sheet for Healthcare Providers:  GravelBags.it  This test is no t yet approved or cleared by the [place] FDA and  has been authorized for detection and/or diagnosis of SARS-CoV-2  by FDA under an Emergency Use Authorization (EUA). This EUA will remain  in effect (meaning this test can be used) for the duration of the COVID-19 declaration under Section 564(b)(1) of the Act, 21 U.S.C. section 360bbb-3(b)(1), unless the authorization is terminated or revoked sooner.     Influenza A by PCR NEGATIVE NEGATIVE Final   Influenza B by PCR NEGATIVE NEGATIVE Final    Comment: (NOTE) The Xpert Xpress SARS-CoV-2/FLU/RSV assay is intended as an aid in  the diagnosis of influenza from Nasopharyngeal swab specimens and  should not be used as a sole basis for treatment. Nasal washings and  aspirates are unacceptable for Xpert Xpress SARS-CoV-2/FLU/RSV  testing.  Fact Sheet for Patients: PinkCheek.be  Fact Sheet for Healthcare Providers: GravelBags.it  This test is not yet approved or cleared by the [place] FDA and  has been authorized for detection and/or diagnosis of SARS-CoV-2 by  FDA under an Emergency Use Authorization (EUA). This EUA will remain  in effect (meaning this test can be used) for the duration of the  Covid-19 declaration under Section 564(b)(1) of the Act, 21  U.S.C. section 360bbb-3(b)(1), unless the authorization is  terminated or revoked. Performed at [facility], [address], [place], [place] [address]   Culture, Urine     Status: Abnormal   Collection Time: 09/23/20  8:32 AM   Specimen: Urine, Catheterized  Result Value Ref Range Status   Specimen Description   Final    URINE, CATHETERIZED Performed at [place] [address] [place], [place] [address]    Special Requests   Final    NONE Performed at [facility], [place] [address], [place], [place] [address]    Culture (A)  Final    30,000 COLONIES/mL MULTIPLE SPECIES PRESENT, SUGGEST RECOLLECTION   Report Status 09/24/2020 FINAL  Final  Culture, blood (Routine X 2) w Reflex to ID  Panel     Status: None   Collection Time: 09/23/20 11:23 AM   Specimen: BLOOD RIGHT ARM  Result Value Ref Range Status   Specimen Description   Final    BLOOD RIGHT ARM Performed at [facility], [place] [address], [place], [place] [address]    Special Requests   Final    BOTTLES DRAWN AEROBIC AND ANAEROBIC Blood Culture adequate volume Performed at [facility] [address] [place], [place] [address]    Culture   Final    NO GROWTH 5 DAYS Performed at [facility], [place] [address] [place], [place] [address]    Report Status 09/28/2020 FINAL  Final  Culture, blood (Routine X 2) w Reflex to ID Panel     Status: None   Collection Time: 09/23/20 11:24 AM   Specimen: BLOOD RIGHT ARM  Result Value Ref Range Status   Specimen Description   Final    BLOOD RIGHT ARM Performed at [facility], [place] [address], [place], [place] [address]    Special Requests   Final    BOTTLES DRAWN AEROBIC AND ANAEROBIC Blood Culture adequate volume Performed at [facility] [address], [place], [place] [address]    Culture   Final    NO GROWTH 5 DAYS Performed at [facility], [place] [address] [place],  [address]    Report Status 09/28/2020 FINAL  Final     Labs: CBC: Recent Labs  Lab 09/24/20 0658 09/25/20 0515 09/26/20 0508  WBC 15.6* 13.3* 9.9  NEUTROABS 11.1*  --  6.0  HGB 13.8 14.1 13.5  HCT 39.8 40.2 39.4  MCV 89.4 89.7 90.4  PLT 188 166 009    Basic Metabolic Panel: Recent Labs  Lab 09/23/20 1123 09/24/20 0644 09/24/20 0658 09/25/20 0515 09/26/20 0508 09/27/20 0517 09/28/20 0504 09/29/20 0452  NA 138   < >  --  135 136 135 137 138  K 3.8   < >  --  3.2* 3.7 4.4 3.3* 3.5  CL 101   < >  --  103 105 106 107 106  CO2 22   < >  --  22 23 22 23 25   GLUCOSE 173*   < >  --  112* 101* 111* 92 97  BUN 31*   < >  --  33* 25* 18 17 17   CREATININE 1.79*   < >  --  2.09* 1.99* 1.99* 1.89* 1.74*  CALCIUM 13.1*    < >  --  10.6* 10.8* 10.7* 10.5* 11.2*  MG 2.1  --  2.0 2.1 2.0  --  2.0  --   PHOS 1.3*   < >  --  2.8 2.4* 3.0 2.7 2.2*   < > = values in this interval not displayed.    Liver Function Tests: Recent Labs  Lab 09/25/20 0515 09/26/20 0508 09/27/20 0517 09/28/20 0504 09/29/20 0452  ALBUMIN 3.4* 3.1* 3.2* 3.1* 3.3*    CBG: Recent Labs  Lab 09/28/20 1153 09/28/20 1711 09/28/20 2140 09/29/20 0755 09/29/20 1218  GLUCAP 124* 130* 100* 122* 136*    Urinalysis    Component Value Date/Time   COLORURINE YELLOW 09/29/2020 1001   APPEARANCEUR HAZY (A) 09/29/2020 1001   LABSPEC 1.008 09/29/2020 1001  PHURINE 8.0 09/29/2020 1001   GLUCOSEU 50 (A) 09/29/2020 1001   HGBUR LARGE (A) 09/29/2020 1001   BILIRUBINUR NEGATIVE 09/29/2020 1001   KETONESUR NEGATIVE 09/29/2020 1001   PROTEINUR NEGATIVE 09/29/2020 1001   NITRITE NEGATIVE 09/29/2020 1001   LEUKOCYTESUR SMALL (A) 09/29/2020 1001    I discussed in detail with patient's daughter, updated care and answered all questions.  Time coordinating discharge: 50 minutes  SIGNED:  Vernell Leep, MD, [place], [facility]. [facility]  To contact the attending provider between 7A-7P or the covering provider during after hours 7P-7A, please log into the web site [email or web address] and access using universal [place] password for that web site. If you do not have the password, please call the hospital operator.

## 2020-09-29 NOTE — Discharge Instructions (Signed)

## 2020-09-29 NOTE — Progress Notes (Addendum)
Patient is eating small amounts of solid food and indicates she is free of nausea and vomiting.  She feels ready to go home.  Gastric and duodenal biopsies are still pending.  I advised the patient that her recurrent vomiting could either be due to diabetic gastroparesis, or to her frequent use of THC.    (Even though she had tolerated THC for many years without nausea and vomiting, it is possible she has developed an intolerance as the years have gone by.)    Concerning possible diabetic gastroparesis, I suggested consideration of an outpatient gastric emptying scan.  However, the patient indicates that she cannot tolerate that test, which apparently has been tried on several occasions in the past.  Recommendations:  1.  I will contact the patient with her biopsy results when they are ready  2.  I do feel the patient is ready to go home, from the GI tract standpoint.  3.  Because the patient is on aspirin, I would resume omeprazole 20 mg daily as she was ostensibly using prior to admission.  4.  No other new medications are needed from the GI tract standpoint.  Specifically, as noted in yesterday's note, I would avoid the use of metoclopramide in this patient unless we are forced to do so as her case evolves over time.  5.  No special diet is needed for the patient at this point.  Depending on how she does as an outpatient, she may find that she is more tolerant of low residue foods, particularly if diabetic gastroparesis is present.  However, at this time, I do not think there is sufficient clarity as to her underlying diagnosis or how she fares on a day-to-day basis to feel that we have to institute such a dietary restriction.  Instead, I recommend to the patient that she give a 1 week trial of a low residue diet to see if that makes a difference, and if it does, she could then try gradually reintroducing fruits and vegetables back into her diet, to see how much she could get away with without  provoking symptoms.  6.  As noted above, I do not think it is practical to try to get a gastric emptying scan on this patient.  7.  No specific follow-up in my office is needed.  As previously noted, I recommend she discuss with her primary physician, colon cancer screening options.  She would probably be best served by stool studies rather than colonoscopy, because she might have a difficult time with the prep for colonoscopy.  If she does end up needing a colonoscopy, it might be easier for her to have it done in [place], where she resides, especially since that is where her primary physician is.  We will sign off.  Please call us if you have further questions concerning this patient.  Cleotis Nipper, M.D. Pager [phone] If no answer or after 5 PM call [phone]

## 2020-10-01 DIAGNOSIS — Z87891 Personal history of nicotine dependence: Secondary | ICD-10-CM | POA: Diagnosis not present

## 2020-10-01 DIAGNOSIS — E210 Primary hyperparathyroidism: Secondary | ICD-10-CM | POA: Diagnosis not present

## 2020-10-01 DIAGNOSIS — E1142 Type 2 diabetes mellitus with diabetic polyneuropathy: Secondary | ICD-10-CM | POA: Diagnosis not present

## 2020-10-01 DIAGNOSIS — I5032 Chronic diastolic (congestive) heart failure: Secondary | ICD-10-CM | POA: Diagnosis not present

## 2020-10-01 DIAGNOSIS — Z9181 History of falling: Secondary | ICD-10-CM | POA: Diagnosis not present

## 2020-10-01 DIAGNOSIS — F419 Anxiety disorder, unspecified: Secondary | ICD-10-CM | POA: Diagnosis not present

## 2020-10-01 DIAGNOSIS — R001 Bradycardia, unspecified: Secondary | ICD-10-CM | POA: Diagnosis not present

## 2020-10-01 DIAGNOSIS — Z794 Long term (current) use of insulin: Secondary | ICD-10-CM | POA: Diagnosis not present

## 2020-10-01 DIAGNOSIS — F329 Major depressive disorder, single episode, unspecified: Secondary | ICD-10-CM | POA: Diagnosis not present

## 2020-10-01 DIAGNOSIS — Z7982 Long term (current) use of aspirin: Secondary | ICD-10-CM | POA: Diagnosis not present

## 2020-10-01 DIAGNOSIS — I495 Sick sinus syndrome: Secondary | ICD-10-CM | POA: Diagnosis not present

## 2020-10-01 DIAGNOSIS — I2510 Atherosclerotic heart disease of native coronary artery without angina pectoris: Secondary | ICD-10-CM | POA: Diagnosis not present

## 2020-10-01 DIAGNOSIS — E876 Hypokalemia: Secondary | ICD-10-CM | POA: Diagnosis not present

## 2020-10-01 DIAGNOSIS — Z950 Presence of cardiac pacemaker: Secondary | ICD-10-CM | POA: Diagnosis not present

## 2020-10-01 DIAGNOSIS — I130 Hypertensive heart and chronic kidney disease with heart failure and stage 1 through stage 4 chronic kidney disease, or unspecified chronic kidney disease: Secondary | ICD-10-CM | POA: Diagnosis not present

## 2020-10-01 DIAGNOSIS — R112 Nausea with vomiting, unspecified: Secondary | ICD-10-CM | POA: Diagnosis not present

## 2020-10-01 DIAGNOSIS — M6281 Muscle weakness (generalized): Secondary | ICD-10-CM | POA: Diagnosis not present

## 2020-10-01 DIAGNOSIS — E1122 Type 2 diabetes mellitus with diabetic chronic kidney disease: Secondary | ICD-10-CM | POA: Diagnosis not present

## 2020-10-01 DIAGNOSIS — N1832 Chronic kidney disease, stage 3b: Secondary | ICD-10-CM | POA: Diagnosis not present

## 2020-10-01 DIAGNOSIS — K5909 Other constipation: Secondary | ICD-10-CM | POA: Diagnosis not present

## 2020-10-02 ENCOUNTER — Other Ambulatory Visit: Payer: Self-pay | Admitting: *Deleted

## 2020-10-02 NOTE — Patient Outreach (Addendum)
[facility]) Care Management  10/02/2020  Yevette Knust 27-Aug-1951 [id number]   EMMI Red Flag  Day#: 1 Date: 10/01/20 Red Alert Reason : Scheduled follow up? No    Outreach attempt #1 Subjective: Unsuccessful call attempt to patient preferred contact number, no answer able to leave a HIPAA compliant voicemail message for return call.   Plan Will plan return call in the next 4 business days if no return call on today.   Addendum Incoming call from Rosita Kea, daughter of patient and listed as designated party release.  Explained reason for the call and [facility] care management services.  Discussed EMMI call, [name] states that she is familiar because she answered the phone and call response with her mother bedside her.Scheduled follow up ? NO, [name] states patient discharged on Friday evening and she received call on Sunday night. She reports being able to schedule patient discharge follow up appointments with Dr. [place] Crane for 11/4 and Cardiology on 11/2. Social She discussed patient feeling pretty good on today, she is currently out at [place] will with another family member. She reports home health physical therapy visit on yesterday, and occupational therapy called for visit on today, but patient not at home, will return call later for visit.  Patient lives at home with her daughter [name], that is able to assist as needed. Patient uses a cane, or walker as needed, she reports patient having fall prior to admission no injury.  Patient requires some assistance with getting in and out of shower, but bathes herself.  Patient uses [employer] transportation for appointments and daughter accompanies her.   Conditions  Patient daughter discussed recent hospital admission for abdominal pain, nausea, hypercalcemia. She reports patient appetite has improved some no complaint of abdominal pain. She reports patient taking miralax twice daily and no bowel movement in 2 days, she plans to follow  up with Dr. [place] Crane on tomorrow as that will be 3 days and no bowel movement , she discussed that this has been an ongoing problem for more than a year.  She reports adjustments made in patient blood pressure medications, and she is now monitoring it at home unable to recall reading but states it was in normal range per monitor.   Medication  Patient daughter states patient medications are sent through [facility] in pill packages and she is able to manage.   Patient daughter states that they have everything under control now , she denies any new for complex care coordination needs at this time.  Explained that patient will receive another follow up call and depending on response to questions she may receive follow up call from care coordinator.    Plan Will plan case close, Assessed no further intervention needed.    Joylene Draft, RN, BSN  [place] Management Coordinator  [phone]- Mobile [phone]- Toll Free Main Office

## 2020-10-03 DIAGNOSIS — R112 Nausea with vomiting, unspecified: Secondary | ICD-10-CM | POA: Diagnosis not present

## 2020-10-03 DIAGNOSIS — I130 Hypertensive heart and chronic kidney disease with heart failure and stage 1 through stage 4 chronic kidney disease, or unspecified chronic kidney disease: Secondary | ICD-10-CM | POA: Diagnosis not present

## 2020-10-03 DIAGNOSIS — E210 Primary hyperparathyroidism: Secondary | ICD-10-CM | POA: Diagnosis not present

## 2020-10-03 DIAGNOSIS — E1142 Type 2 diabetes mellitus with diabetic polyneuropathy: Secondary | ICD-10-CM | POA: Diagnosis not present

## 2020-10-03 DIAGNOSIS — M6281 Muscle weakness (generalized): Secondary | ICD-10-CM | POA: Diagnosis not present

## 2020-10-03 DIAGNOSIS — K5909 Other constipation: Secondary | ICD-10-CM | POA: Diagnosis not present

## 2020-10-04 DIAGNOSIS — R112 Nausea with vomiting, unspecified: Secondary | ICD-10-CM | POA: Diagnosis not present

## 2020-10-04 DIAGNOSIS — K5909 Other constipation: Secondary | ICD-10-CM | POA: Diagnosis not present

## 2020-10-04 DIAGNOSIS — M6281 Muscle weakness (generalized): Secondary | ICD-10-CM | POA: Diagnosis not present

## 2020-10-04 DIAGNOSIS — E210 Primary hyperparathyroidism: Secondary | ICD-10-CM | POA: Diagnosis not present

## 2020-10-04 DIAGNOSIS — E1142 Type 2 diabetes mellitus with diabetic polyneuropathy: Secondary | ICD-10-CM | POA: Diagnosis not present

## 2020-10-04 DIAGNOSIS — I130 Hypertensive heart and chronic kidney disease with heart failure and stage 1 through stage 4 chronic kidney disease, or unspecified chronic kidney disease: Secondary | ICD-10-CM | POA: Diagnosis not present

## 2020-10-05 DIAGNOSIS — E1142 Type 2 diabetes mellitus with diabetic polyneuropathy: Secondary | ICD-10-CM | POA: Diagnosis not present

## 2020-10-05 DIAGNOSIS — E210 Primary hyperparathyroidism: Secondary | ICD-10-CM | POA: Diagnosis not present

## 2020-10-05 DIAGNOSIS — K5909 Other constipation: Secondary | ICD-10-CM | POA: Diagnosis not present

## 2020-10-05 DIAGNOSIS — I130 Hypertensive heart and chronic kidney disease with heart failure and stage 1 through stage 4 chronic kidney disease, or unspecified chronic kidney disease: Secondary | ICD-10-CM | POA: Diagnosis not present

## 2020-10-05 DIAGNOSIS — M6281 Muscle weakness (generalized): Secondary | ICD-10-CM | POA: Diagnosis not present

## 2020-10-05 DIAGNOSIS — R112 Nausea with vomiting, unspecified: Secondary | ICD-10-CM | POA: Diagnosis not present

## 2020-10-06 ENCOUNTER — Other Ambulatory Visit: Payer: Self-pay | Admitting: Internal Medicine

## 2020-10-06 DIAGNOSIS — K5909 Other constipation: Secondary | ICD-10-CM | POA: Diagnosis not present

## 2020-10-06 DIAGNOSIS — E1142 Type 2 diabetes mellitus with diabetic polyneuropathy: Secondary | ICD-10-CM | POA: Diagnosis not present

## 2020-10-06 DIAGNOSIS — R112 Nausea with vomiting, unspecified: Secondary | ICD-10-CM | POA: Diagnosis not present

## 2020-10-06 DIAGNOSIS — M6281 Muscle weakness (generalized): Secondary | ICD-10-CM | POA: Diagnosis not present

## 2020-10-06 DIAGNOSIS — E210 Primary hyperparathyroidism: Secondary | ICD-10-CM | POA: Diagnosis not present

## 2020-10-06 DIAGNOSIS — I130 Hypertensive heart and chronic kidney disease with heart failure and stage 1 through stage 4 chronic kidney disease, or unspecified chronic kidney disease: Secondary | ICD-10-CM | POA: Diagnosis not present

## 2020-10-09 DIAGNOSIS — M6281 Muscle weakness (generalized): Secondary | ICD-10-CM | POA: Diagnosis not present

## 2020-10-09 DIAGNOSIS — E1142 Type 2 diabetes mellitus with diabetic polyneuropathy: Secondary | ICD-10-CM | POA: Diagnosis not present

## 2020-10-09 DIAGNOSIS — R112 Nausea with vomiting, unspecified: Secondary | ICD-10-CM | POA: Diagnosis not present

## 2020-10-09 DIAGNOSIS — E210 Primary hyperparathyroidism: Secondary | ICD-10-CM | POA: Diagnosis not present

## 2020-10-09 DIAGNOSIS — K5909 Other constipation: Secondary | ICD-10-CM | POA: Diagnosis not present

## 2020-10-09 DIAGNOSIS — I130 Hypertensive heart and chronic kidney disease with heart failure and stage 1 through stage 4 chronic kidney disease, or unspecified chronic kidney disease: Secondary | ICD-10-CM | POA: Diagnosis not present

## 2020-10-09 NOTE — Progress Notes (Signed)
 Cardiology Office Note    Date:  10/10/2020   ID:  Melissa Lopez, DOB 06/03/1951, MRN [id number]  PCP:  Hall, John Z, MD  Cardiologist: No primary care provider on file. EPS: None  No chief complaint on file.   History of Present Illness:  Melissa Lopez is a 68 y.o. 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   Patient was last seen by Mr. Quinn 08/2020 complaining of dyspnea on exertion.  He updated echo which showed normal systolic function and grade 1 DD  Patient was discharged from the hospital 09/29/2020 with intractable nausea vomiting abdominal pain of unclear etiology question diabetic gastroparesis and dysmotility or bile reflux gastritis and ongoing THC use.  She also had hypercalcemia hypokalemia and hypophosphatemia.  Patient did have primary hyperparathyroidism.   Patient comes in for f/u with her daughter. Has chronic pain all joints. BP running high 180 before meds but then 116-118. Has fallen twice since home-became dizzy and then fell. Not when she first gets up but after she walks about 75 ft. Happened about 2 hrs after taking meds. No syncope. Was happening more often prior to hospitalization. Doesn't stay hydrated-doesn't like to drink water. . Coreg has been decreased from 25 mg bid to 6.25 mg bid and she is taking hydralazine 50 mg bid but discharge note says 25 mg once daily. Went to [employer] yest and bought fudge and candy and hid it under her bed.  Past Medical History:  Diagnosis Date  . CKD (chronic kidney disease) stage 3, GFR 30-59 ml/min (HCC) 10/31/2018   - per patient report  . Coronary artery disease    per patient report - 2 caths with non-obstructive CAD  . Essential hypertension   . H/O cardiac pacemaker  10/31/2018  . Sick sinus syndrome (HCC)   . Type 2 diabetes mellitus with complication, with long-term current use of insulin (HCC) 10/31/2018   with diabetic nephropathy    Past Surgical History:  Procedure Laterality Date  . BIOPSY  09/28/2020   Procedure: BIOPSY;  Surgeon: Buccini, Robert, MD;  [facility]: [facility];  Service: Endoscopy;;  . CARDIAC PACEMAKER PLACEMENT  2008   in [place] ([facility]) for sick sinus syndrome  . ESOPHAGOGASTRODUODENOSCOPY N/A 09/28/2020   Procedure: ESOPHAGOGASTRODUODENOSCOPY (EGD);  Surgeon: Buccini, Robert, MD;  [facility];  Service: Endoscopy;  Laterality: N/A;  . ESOPHAGOGASTRODUODENOSCOPY (EGD) WITH PROPOFOL N/A 11/10/2018   Procedure: ESOPHAGOGASTRODUODENOSCOPY (EGD) WITH PROPOFOL;  Surgeon: Schooler, Vincent, MD;  [facility];  Service: Endoscopy;  Laterality: N/A;  . LEFT HEART CATH AND CORONARY ANGIOGRAPHY N/A 10/31/2018   Procedure: LEFT HEART CATH AND CORONARY ANGIOGRAPHY;  Surgeon: Harding, David W, MD;  [facility];  Service: Cardiovascular;  Laterality: N/A;  . PACEMAKER GENERATOR CHANGE  09/2018   in [place] ([facility])    Current Medications: Current Meds  Medication Sig  . amLODipine (NORVASC) 10 MG tablet TAKE 1 TABLET BY MOUTH EVERY DAY  . aspirin 81 MG chewable tablet Chew 1 tablet (81 mg total) by mouth daily.  . buPROPion (WELLBUTRIN SR) 150 MG 12 hr tablet Take 150 mg by mouth daily.  . carvedilol (COREG) 6.25 MG tablet Take 1 tablet (6.25 mg total) by mouth 2 (two) times daily.  . escitalopram (  LEXAPRO) 20 MG tablet Take 20 mg by mouth daily.  Marland Kitchen gabapentin (NEURONTIN) 100 MG capsule Take 100 mg by mouth 3 (three) times daily.  . hydrALAZINE (APRESOLINE) 25 MG tablet Take 1 tablet (25 mg total) by mouth daily.  . insulin degludec (TRESIBA FLEXTOUCH) 100 UNIT/ML FlexTouch Pen Inject 5 Units into the skin daily.  Marland Kitchen lisinopril (ZESTRIL) 20 MG tablet Take 1 tablet (20 mg total) by mouth daily.  Marland Kitchen  LORazepam (ATIVAN) 1 MG tablet Take 1 mg by mouth daily as needed for anxiety.  . Multiple Vitamin (MULTIVITAMIN WITH MINERALS) TABS tablet Take 1 tablet by mouth daily.  . Nutritional Supplements (,FEEDING SUPPLEMENT, PROSOURCE PLUS) liquid Take 30 mLs by mouth daily.  Marland Kitchen omeprazole (PRILOSEC) 20 MG capsule Take 20 mg by mouth daily.  Marland Kitchen oxybutynin (DITROPAN-XL) 10 MG 24 hr tablet Take 10 mg by mouth at bedtime.  . polyethylene glycol (MIRALAX / GLYCOLAX) 17 g packet Take 17 g by mouth 2 (two) times daily.  . rosuvastatin (CRESTOR) 5 MG tablet Take one tablet daily  . traZODone (DESYREL) 100 MG tablet Take 100 mg by mouth at bedtime.  . [DISCONTINUED] amLODipine (NORVASC) 10 MG tablet TAKE 1 TABLET BY MOUTH EVERY DAY *NEEDS APPOINTMENT*  . [DISCONTINUED] carvedilol (COREG) 6.25 MG tablet Take 1 tablet (6.25 mg total) by mouth 2 (two) times daily.  . [DISCONTINUED] hydrALAZINE (APRESOLINE) 50 MG tablet TAKE 1 TABLET BY MOUTH EVERY 8 (EIGHT) HOURS. PLEASE MAKE OVERDUE APPT WITH DR. ALLRED BEFORE ANYMORE REFILLS. 1ST ATTEMPT  . [DISCONTINUED] lisinopril (ZESTRIL) 20 MG tablet Take 1 tablet (20 mg total) by mouth daily.  . [DISCONTINUED] rosuvastatin (CRESTOR) 5 MG tablet TAKE 1 TABLET BY MOUTH DAILY AT 6:00PM *NEEDS APPOINTMENT*     Allergies:   Oxycodone-acetaminophen, Contrast media [iodinated diagnostic agents], No known allergies, Oxycodone, and Xarelto  [rivaroxaban]   Social History   Socioeconomic History  . Marital status: Single    Spouse name: Not on file  . Number of children: 4  . Years of education: Not on file  . Highest education level: Not on file  Occupational History  . Not on file  Tobacco Use  . Smoking status: Former Smoker    Years: 3.00    Quit date: 12/09/1969    Years since quitting: 50.8  . Smokeless tobacco: Never Used  Substance and Sexual Activity  . Alcohol use: Not Currently  . Drug use: Never  . Sexual activity: Not on file  Other Topics Concern  . Not  on file  Social History Narrative   Moved from [place] to be with daughter in [place].   Now in [employer]   Social Determinants of Health   Financial Resource Strain:   . Difficulty of Paying Living Expenses: Not on file  Food Insecurity:   . Worried About Charity fundraiser in the Last Year: Not on file  . Ran Out of Food in the Last Year: Not on file  Transportation Needs:   . Lack of Transportation (Medical): Not on file  . Lack of Transportation (Non-Medical): Not on file  Physical Activity:   . Days of Exercise per Week: Not on file  . Minutes of Exercise per Session: Not on file  Stress:   . Feeling of Stress : Not on file  Social Connections:   . Frequency of Communication with Friends and Family: Not on file  . Frequency of Social Gatherings with Friends and Family: Not on file  . Attends Religious  Services: Not on file  . Active Member of Clubs or Organizations: Not on file  . Attends Club or Organization Meetings: Not on file  . Marital Status: Not on file     Family History:  The patient's   Family history is unknown by patient.   ROS:   Please see the history of present illness.    ROS All other systems reviewed and are negative.   PHYSICAL EXAM:   VS:  BP 116/68   Pulse (!) 43   Ht 5' 4" (1.626 m)   Wt 164 lb (74.4 kg)   SpO2 98%   BMI 28.15 kg/m   Physical Exam  GEN: Well nourished, well developed, in no acute distress  Neck: no JVD, carotid bruits, or masses Cardiac:RRR; no murmurs, rubs, or gallops  Respiratory:  clear to auscultation bilaterally, normal work of breathing GI: soft, nontender, nondistended, + BS Ext: without cyanosis, clubbing, or edema, Good distal pulses bilaterally Neuro:  Alert and Oriented x 3 Psych: euthymic mood, full affect  Wt Readings from Last 3 Encounters:  10/10/20 164 lb (74.4 kg)  09/28/20 156 lb 1.6 oz (70.8 kg)  08/11/20 167 lb (75.8 kg)      Studies/Labs Reviewed:   EKG:  EKG is not ordered today.    Recent  Labs: 09/22/2020: ALT 16; TSH 2.207 09/26/2020: Hemoglobin 13.5; Platelets 164 09/28/2020: Magnesium 2.0 09/29/2020: BUN 17; Creatinine, Ser 1.74; Potassium 3.5; Sodium 138   Lipid Panel    Component Value Date/Time   CHOL 153 11/01/2018 0516   TRIG 175 (H) 11/01/2018 0516   HDL 24 (L) 11/01/2018 0516   CHOLHDL 6.4 11/01/2018 0516   VLDL 35 11/01/2018 0516   LDLCALC 94 11/01/2018 0516    Additional studies/ records that were reviewed today include:  2D echo 09/14/2020 IMPRESSIONS     1. The endocardium in the apex is poorly visualized, cannot exclude  possible apical hypokinesis and/or apical hypertrophy, consider contrast  study. . Left ventricular ejection fraction, by estimation, is 55 to 60%.  The left ventricle has normal  function. Left ventricular endocardial border not optimally defined to  evaluate regional wall motion. There is moderate left ventricular  hypertrophy. Left ventricular diastolic parameters are consistent with  Grade I diastolic dysfunction (impaired  relaxation).   2. Right ventricular systolic function is normal. The right ventricular  size is normal.   3. The mitral valve is normal in structure. No evidence of mitral valve  regurgitation. No evidence of mitral stenosis.   4. The aortic valve is tricuspid. Aortic valve regurgitation is not  visualized. No aortic stenosis is present.   5. The inferior vena cava is normal in size with greater than 50%  respiratory variability, suggesting right atrial pressure of 3 mmHg.   Comparison(s): Echocardiogram done 11/01/18 showed and EF of 55-60%.      ASSESSMENT:    1. Coronary artery disease involving native coronary artery of native heart without angina pectoris   2. Cardiac pacemaker in situ   3. SOB (shortness of breath)   4. Essential hypertension   5. Stage 3 chronic kidney disease, unspecified whether stage 3a or 3b CKD (HCC)      PLAN:  In order of problems listed above:  CAD cath in  2019 mild diffuse disease see above for details no angina  Permanent pacemaker for symptomatic bradycardia followed by Dr. Taylor  Dyspnea on exertion stable  Hypertension blood pressure has been fluctuating.  Was running low when   she became dehydrated with recent hospitalization.  Coreg decreased in the hospital from 25 mg twice daily to 6.25 mg twice daily.  She is supposed to be on hydralazine 25 mg once a day but the pill pack has her on 50 mg twice daily or 3 times daily.  Reduce this to 25 mg once daily.  Patient has had 2 falls since she has been out of the hospital preceded by dizziness.  Patient's daughter will continue to monitor her blood pressures.  Encouraged her to stay hydrated and drink more water.  CKD stage III check be met today.    Medication Adjustments/Labs and Tests Ordered: Current medicines are reviewed at length with the patient today.  Concerns regarding medicines are outlined above.  Medication changes, Labs and Tests ordered today are listed in the Patient Instructions below. Patient Instructions  Medication Instructions:  Your physician has recommended you make the following change in your medication:   CHANGE: Hydralazine 38m daily  *If you need a refill on your cardiac medications before your next appointment, please call your pharmacy*   Lab Work: BMET Today  If you have labs (blood work) drawn today and your tests are completely normal, you will receive your results only by: .Marland KitchenMyChart Message (if you have MyChart) OR . A paper copy in the mail If you have any lab test that is abnormal or we need to change your treatment, we will call you to review the results.   Testing/Procedures: None   Follow-Up: At [facility] you and your health needs are our priority.  As part of our continuing mission to provide you with exceptional heart care, we have created designated Provider Care Teams.  These Care Teams include your primary Cardiologist  (physician) and Advanced Practice Providers (APPs -  Physician Assistants and Nurse Practitioners) who all work together to provide you with the care you need, when you need it.  We recommend signing up for the patient portal called "MyChart".  Sign up information is provided on this After Visit Summary.  MyChart is used to connect with patients for Virtual Visits (Telemedicine).  Patients are able to view lab/test results, encounter notes, upcoming appointments, etc.  Non-urgent messages can be sent to your provider as well.   To learn more about what you can do with MyChart, go to [email or web address]    Your next appointment:   3 week(s)  The format for your next appointment:   In Person  Provider:   MErmalinda Barrios PA-C      Signed, MErmalinda Barrios PA-C  10/10/2020 2:47 PM    [address]  [id number]: [phone] Fax: [phone]

## 2020-10-10 ENCOUNTER — Ambulatory Visit: Payer: Medicare Other | Admitting: Family Medicine

## 2020-10-10 ENCOUNTER — Other Ambulatory Visit (HOSPITAL_COMMUNITY)
Admission: RE | Admit: 2020-10-10 | Discharge: 2020-10-10 | Disposition: A | Discharge status: Home / Self Care | Payer: Medicare Other | Source: Ambulatory Visit | Attending: Physician Assistant | Admitting: Physician Assistant

## 2020-10-10 ENCOUNTER — Other Ambulatory Visit: Payer: Self-pay

## 2020-10-10 ENCOUNTER — Encounter: Payer: Self-pay | Admitting: Physician Assistant

## 2020-10-10 ENCOUNTER — Ambulatory Visit (INDEPENDENT_AMBULATORY_CARE_PROVIDER_SITE_OTHER): Payer: Medicare Other | Admitting: Physician Assistant

## 2020-10-10 VITALS — BP 116/68 | HR 43 | Ht 64.0 in | Wt 164.0 lb

## 2020-10-10 DIAGNOSIS — K5909 Other constipation: Secondary | ICD-10-CM | POA: Diagnosis not present

## 2020-10-10 DIAGNOSIS — N183 Chronic kidney disease, stage 3 unspecified: Secondary | ICD-10-CM | POA: Insufficient documentation

## 2020-10-10 DIAGNOSIS — M6281 Muscle weakness (generalized): Secondary | ICD-10-CM | POA: Diagnosis not present

## 2020-10-10 DIAGNOSIS — R112 Nausea with vomiting, unspecified: Secondary | ICD-10-CM | POA: Diagnosis not present

## 2020-10-10 DIAGNOSIS — I10 Essential (primary) hypertension: Secondary | ICD-10-CM | POA: Diagnosis not present

## 2020-10-10 DIAGNOSIS — R0602 Shortness of breath: Secondary | ICD-10-CM | POA: Insufficient documentation

## 2020-10-10 DIAGNOSIS — I2510 Atherosclerotic heart disease of native coronary artery without angina pectoris: Secondary | ICD-10-CM | POA: Diagnosis not present

## 2020-10-10 DIAGNOSIS — Z650 Conviction in civil and criminal proceedings without imprisonment: Secondary | ICD-10-CM | POA: Diagnosis not present

## 2020-10-10 DIAGNOSIS — E210 Primary hyperparathyroidism: Secondary | ICD-10-CM | POA: Diagnosis not present

## 2020-10-10 DIAGNOSIS — Z950 Presence of cardiac pacemaker: Secondary | ICD-10-CM | POA: Diagnosis not present

## 2020-10-10 DIAGNOSIS — I130 Hypertensive heart and chronic kidney disease with heart failure and stage 1 through stage 4 chronic kidney disease, or unspecified chronic kidney disease: Secondary | ICD-10-CM | POA: Diagnosis not present

## 2020-10-10 DIAGNOSIS — E1142 Type 2 diabetes mellitus with diabetic polyneuropathy: Secondary | ICD-10-CM | POA: Diagnosis not present

## 2020-10-10 LAB — BASIC METABOLIC PANEL
Anion gap: 6 (ref 5–15)
BUN: 23 mg/dL (ref 8–23)
CO2: 22 mmol/L (ref 22–32)
Calcium: 11.3 mg/dL — ABNORMAL HIGH (ref 8.9–10.3)
Chloride: 107 mmol/L (ref 98–111)
Creatinine, Ser: 1.83 mg/dL — ABNORMAL HIGH (ref 0.44–1.00)
GFR, Estimated: 30 mL/min — ABNORMAL LOW (ref >60)
Glucose, Bld: 131 mg/dL — ABNORMAL HIGH (ref 70–99)
Potassium: 3.7 mmol/L (ref 3.5–5.1)
Sodium: 135 mmol/L (ref 135–145)

## 2020-10-10 MED ORDER — HYDRALAZINE HCL 25 MG PO TABS
25.0000 mg | ORAL_TABLET | Freq: Every day | ORAL | 3 refills | Status: DC
Start: 2020-10-10 — End: 2020-10-11

## 2020-10-10 MED ORDER — ROSUVASTATIN CALCIUM 5 MG PO TABS
ORAL_TABLET | ORAL | 3 refills | Status: DC
Start: 2020-10-10 — End: 2022-01-01

## 2020-10-10 MED ORDER — LISINOPRIL 20 MG PO TABS: 20.0000 mg | ORAL_TABLET | Freq: Every day | ORAL | 3 refills | Status: DC

## 2020-10-10 MED ORDER — CARVEDILOL 6.25 MG PO TABS
6.2500 mg | ORAL_TABLET | Freq: Two times a day (BID) | ORAL | 3 refills | Status: DC
Start: 2020-10-10 — End: 2020-11-17

## 2020-10-10 MED ORDER — AMLODIPINE BESYLATE 10 MG PO TABS: ORAL_TABLET | ORAL | 3 refills | Status: DC

## 2020-10-10 NOTE — Patient Instructions (Signed)
Medication Instructions:  Your physician has recommended you make the following change in your medication:   CHANGE: Hydralazine 25mg  daily  *If you need a refill on your cardiac medications before your next appointment, please call your pharmacy*   Lab Work: BMET Today  If you have labs (blood work) drawn today and your tests are completely normal, you will receive your results only by: Marland Kitchen MyChart Message (if you have MyChart) OR . A paper copy in the mail If you have any lab test that is abnormal or we need to change your treatment, we will call you to review the results.   Testing/Procedures: None   Follow-Up: At [facility], you and your health needs are our priority.  As part of our continuing mission to provide you with exceptional heart care, we have created designated Provider Care Teams.  These Care Teams include your primary Cardiologist (physician) and Advanced Practice Providers (APPs -  Physician Assistants and Nurse Practitioners) who all work together to provide you with the care you need, when you need it.  We recommend signing up for the patient portal called "MyChart".  Sign up information is provided on this After Visit Summary.  MyChart is used to connect with patients for Virtual Visits (Telemedicine).  Patients are able to view lab/test results, encounter notes, upcoming appointments, etc.  Non-urgent messages can be sent to your provider as well.   To learn more about what you can do with MyChart, go to [email or web address].    Your next appointment:   3 week(s)  The format for your next appointment:   In Person  Provider:   Ermalinda Barrios, PA-C

## 2020-10-11 ENCOUNTER — Ambulatory Visit (INDEPENDENT_AMBULATORY_CARE_PROVIDER_SITE_OTHER): Payer: Medicare Other | Admitting: Podiatry

## 2020-10-11 ENCOUNTER — Encounter: Payer: Self-pay | Admitting: Podiatry

## 2020-10-11 DIAGNOSIS — E0821 Diabetes mellitus due to underlying condition with diabetic nephropathy: Secondary | ICD-10-CM | POA: Diagnosis not present

## 2020-10-11 DIAGNOSIS — N179 Acute kidney failure, unspecified: Secondary | ICD-10-CM

## 2020-10-11 DIAGNOSIS — M79674 Pain in right toe(s): Secondary | ICD-10-CM | POA: Diagnosis not present

## 2020-10-11 DIAGNOSIS — K5909 Other constipation: Secondary | ICD-10-CM | POA: Diagnosis not present

## 2020-10-11 DIAGNOSIS — M79675 Pain in left toe(s): Secondary | ICD-10-CM | POA: Diagnosis not present

## 2020-10-11 DIAGNOSIS — M6281 Muscle weakness (generalized): Secondary | ICD-10-CM | POA: Diagnosis not present

## 2020-10-11 DIAGNOSIS — I130 Hypertensive heart and chronic kidney disease with heart failure and stage 1 through stage 4 chronic kidney disease, or unspecified chronic kidney disease: Secondary | ICD-10-CM | POA: Diagnosis not present

## 2020-10-11 DIAGNOSIS — B351 Tinea unguium: Secondary | ICD-10-CM | POA: Diagnosis not present

## 2020-10-11 DIAGNOSIS — E210 Primary hyperparathyroidism: Secondary | ICD-10-CM | POA: Diagnosis not present

## 2020-10-11 DIAGNOSIS — R112 Nausea with vomiting, unspecified: Secondary | ICD-10-CM | POA: Diagnosis not present

## 2020-10-11 DIAGNOSIS — E1142 Type 2 diabetes mellitus with diabetic polyneuropathy: Secondary | ICD-10-CM | POA: Diagnosis not present

## 2020-10-11 NOTE — Progress Notes (Signed)
This patient returns to my office for at risk foot care.  This patient requires this care by a professional since this patient will be at risk due to having type 2 diabetesand CKD.  This patient is unable to cut nails himself since the patient cannot reach his nails.These nails are painful walking and wearing shoes.  This patient presents for at risk foot care today.  General Appearance  Alert, conversant and in no acute stress.  Vascular  Dorsalis pedis and posterior tibial  pulses are palpable  bilaterally.  Capillary return is within normal limits  bilaterally. Temperature is within normal limits  bilaterally.  Neurologic  Senn-Weinstein monofilament wire test diminished   bilaterally. Muscle power within normal limits bilaterally.  Nails Thick disfigured discolored nails with subungual debris  from hallux to fifth toes bilaterally. No evidence of bacterial infection or drainage bilaterally. Pincer hallux nails  Orthopedic  No limitations of motion  feet .  No crepitus or effusions noted.  No bony pathology or digital deformities noted.  Skin  normotropic skin with no porokeratosis noted bilaterally.  No signs of infections or ulcers noted.     Onychomycosis  Pain in right toes  Pain in left toes  Consent was obtained for treatment procedures.   Mechanical debridement of nails 1-5  bilaterally performed with a nail nipper.  Filed with dremel without incident.    Return office visit    3 months                  Told patient to return for periodic foot care and evaluation due to potential at risk complications.   Gardiner Barefoot DPM

## 2020-10-12 ENCOUNTER — Ambulatory Visit (INDEPENDENT_AMBULATORY_CARE_PROVIDER_SITE_OTHER): Payer: Medicare Other

## 2020-10-12 DIAGNOSIS — R32 Unspecified urinary incontinence: Secondary | ICD-10-CM | POA: Diagnosis not present

## 2020-10-12 DIAGNOSIS — M6281 Muscle weakness (generalized): Secondary | ICD-10-CM | POA: Diagnosis not present

## 2020-10-12 DIAGNOSIS — R112 Nausea with vomiting, unspecified: Secondary | ICD-10-CM | POA: Diagnosis not present

## 2020-10-12 DIAGNOSIS — I252 Old myocardial infarction: Secondary | ICD-10-CM | POA: Diagnosis not present

## 2020-10-12 DIAGNOSIS — I130 Hypertensive heart and chronic kidney disease with heart failure and stage 1 through stage 4 chronic kidney disease, or unspecified chronic kidney disease: Secondary | ICD-10-CM | POA: Diagnosis not present

## 2020-10-12 DIAGNOSIS — K219 Gastro-esophageal reflux disease without esophagitis: Secondary | ICD-10-CM | POA: Diagnosis not present

## 2020-10-12 DIAGNOSIS — E1022 Type 1 diabetes mellitus with diabetic chronic kidney disease: Secondary | ICD-10-CM | POA: Diagnosis not present

## 2020-10-12 DIAGNOSIS — Z96653 Presence of artificial knee joint, bilateral: Secondary | ICD-10-CM | POA: Diagnosis not present

## 2020-10-12 DIAGNOSIS — E1142 Type 2 diabetes mellitus with diabetic polyneuropathy: Secondary | ICD-10-CM | POA: Diagnosis not present

## 2020-10-12 DIAGNOSIS — I2510 Atherosclerotic heart disease of native coronary artery without angina pectoris: Secondary | ICD-10-CM | POA: Diagnosis not present

## 2020-10-12 DIAGNOSIS — K5909 Other constipation: Secondary | ICD-10-CM | POA: Diagnosis not present

## 2020-10-12 DIAGNOSIS — L02211 Cutaneous abscess of abdominal wall: Secondary | ICD-10-CM | POA: Diagnosis not present

## 2020-10-12 DIAGNOSIS — E210 Primary hyperparathyroidism: Secondary | ICD-10-CM | POA: Diagnosis not present

## 2020-10-12 DIAGNOSIS — Z0189 Encounter for other specified special examinations: Secondary | ICD-10-CM | POA: Diagnosis not present

## 2020-10-12 DIAGNOSIS — Z950 Presence of cardiac pacemaker: Secondary | ICD-10-CM | POA: Diagnosis not present

## 2020-10-12 DIAGNOSIS — G8929 Other chronic pain: Secondary | ICD-10-CM | POA: Diagnosis not present

## 2020-10-12 DIAGNOSIS — E782 Mixed hyperlipidemia: Secondary | ICD-10-CM | POA: Diagnosis not present

## 2020-10-12 DIAGNOSIS — N189 Chronic kidney disease, unspecified: Secondary | ICD-10-CM | POA: Diagnosis not present

## 2020-10-12 DIAGNOSIS — D72829 Elevated white blood cell count, unspecified: Secondary | ICD-10-CM | POA: Diagnosis not present

## 2020-10-12 DIAGNOSIS — I2584 Coronary atherosclerosis due to calcified coronary lesion: Secondary | ICD-10-CM | POA: Diagnosis not present

## 2020-10-12 LAB — CUP PACEART REMOTE DEVICE CHECK
Battery Remaining Longevity: 125 mo
Battery Remaining Percentage: 95.5 %
Battery Voltage: 2.99 V
Brady Statistic AP VP Percent: 59 %
Brady Statistic AP VS Percent: 39 %
Brady Statistic AS VP Percent: 1 %
Brady Statistic AS VS Percent: 2.1 %
Brady Statistic RA Percent Paced: 98 %
Brady Statistic RV Percent Paced: 59 %
Date Time Interrogation Session: 20211104020012
Implantable Lead Implant Date: 20110113
Implantable Lead Implant Date: 20110113
Implantable Lead Location: 753859
Implantable Lead Location: 753860
Implantable Pulse Generator Implant Date: 20191008
Lead Channel Impedance Value: 480 Ohm
Lead Channel Impedance Value: 650 Ohm
Lead Channel Pacing Threshold Amplitude: 0.625 V
Lead Channel Pacing Threshold Amplitude: 1.375 V
Lead Channel Pacing Threshold Pulse Width: 0.4 ms
Lead Channel Pacing Threshold Pulse Width: 0.4 ms
Lead Channel Sensing Intrinsic Amplitude: 12 mV
Lead Channel Sensing Intrinsic Amplitude: 4.8 mV
Lead Channel Setting Pacing Amplitude: 1.625
Lead Channel Setting Pacing Amplitude: 1.625
Lead Channel Setting Pacing Pulse Width: 0.4 ms
Lead Channel Setting Sensing Sensitivity: 2 mV
Pulse Gen Model: 2272
Pulse Gen Serial Number: 9064940

## 2020-10-12 NOTE — Progress Notes (Signed)
Remote pacemaker transmission.   

## 2020-10-16 DIAGNOSIS — I130 Hypertensive heart and chronic kidney disease with heart failure and stage 1 through stage 4 chronic kidney disease, or unspecified chronic kidney disease: Secondary | ICD-10-CM | POA: Diagnosis not present

## 2020-10-16 DIAGNOSIS — E1142 Type 2 diabetes mellitus with diabetic polyneuropathy: Secondary | ICD-10-CM | POA: Diagnosis not present

## 2020-10-16 DIAGNOSIS — E210 Primary hyperparathyroidism: Secondary | ICD-10-CM | POA: Diagnosis not present

## 2020-10-16 DIAGNOSIS — M6281 Muscle weakness (generalized): Secondary | ICD-10-CM | POA: Diagnosis not present

## 2020-10-16 DIAGNOSIS — R112 Nausea with vomiting, unspecified: Secondary | ICD-10-CM | POA: Diagnosis not present

## 2020-10-16 DIAGNOSIS — K5909 Other constipation: Secondary | ICD-10-CM | POA: Diagnosis not present

## 2020-10-17 DIAGNOSIS — K5909 Other constipation: Secondary | ICD-10-CM | POA: Diagnosis not present

## 2020-10-17 DIAGNOSIS — E1142 Type 2 diabetes mellitus with diabetic polyneuropathy: Secondary | ICD-10-CM | POA: Diagnosis not present

## 2020-10-17 DIAGNOSIS — I130 Hypertensive heart and chronic kidney disease with heart failure and stage 1 through stage 4 chronic kidney disease, or unspecified chronic kidney disease: Secondary | ICD-10-CM | POA: Diagnosis not present

## 2020-10-17 DIAGNOSIS — M6281 Muscle weakness (generalized): Secondary | ICD-10-CM | POA: Diagnosis not present

## 2020-10-17 DIAGNOSIS — R112 Nausea with vomiting, unspecified: Secondary | ICD-10-CM | POA: Diagnosis not present

## 2020-10-17 DIAGNOSIS — E210 Primary hyperparathyroidism: Secondary | ICD-10-CM | POA: Diagnosis not present

## 2020-10-17 NOTE — Progress Notes (Deleted)
Cardiology Office Note    Date:  10/17/2020   ID:  Melissa Lopez, DOB 02/18/1951, MRN [id number]  PCP:  Celene Squibb, MD  Cardiologist: No primary care provider on file. EPS: None  No chief complaint on file.   History of Present Illness:  Melissa Lopez is a 69 y.o. 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   Patient was last seen by Mr. Leonides Sake 08/2020 complaining of dyspnea on exertion.  He updated echo which showed normal systolic function and grade 1 DD   Patient was discharged from the hospital 09/29/2020 with intractable nausea vomiting abdominal pain of unclear etiology question diabetic gastroparesis and dysmotility or bile reflux gastritis and ongoing THC use.  She also had hypercalcemia hypokalemia and hypophosphatemia.  Patient did have primary hyperparathyroidism.    I saw the patient on 10/10/2020 at which time she was complaining of dizziness with falls after getting up and walking about 75 feet.  She was not staying hydrated.  Coreg had decreased from 25 mg twice daily to 6.25 mg twice daily and she was taking hydralazine 50 mg twice daily but was only supposed to be on 25 mg once daily.  I reduced to   25 mg once daily and encouraged her to stay hydrated.  Creatinine was 1.83.    Past Medical History:  Diagnosis Date  . CKD (chronic kidney disease) stage 3, GFR 30-59 ml/min (HCC) 10/31/2018   - per patient report  . Coronary artery disease    per patient report - 2 caths with non-obstructive CAD  . Essential hypertension   . H/O cardiac pacemaker 10/31/2018  . Sick sinus syndrome ([place])   . Type 2 diabetes mellitus with complication, with long-term current use of insulin ([place]) 10/31/2018   with diabetic nephropathy    Past  Surgical History:  Procedure Laterality Date  . BIOPSY  09/28/2020   Procedure: BIOPSY;  Surgeon: Ronald Lobo, MD;  [facility];  Service: Endoscopy;;  . CARDIAC PACEMAKER PLACEMENT  2008   in [place] ([facility] for sick [place] syndrome  . ESOPHAGOGASTRODUODENOSCOPY N/A 09/28/2020   Procedure: ESOPHAGOGASTRODUODENOSCOPY (EGD);  Surgeon: Ronald Lobo, MD;  Location: [facility];  Service: Endoscopy;  Laterality: N/A;  . ESOPHAGOGASTRODUODENOSCOPY (EGD) WITH PROPOFOL N/A 11/10/2018   Procedure: ESOPHAGOGASTRODUODENOSCOPY (EGD) WITH PROPOFOL;  Surgeon: Wilford Corner, MD;  Location: [facility];  Service: Endoscopy;  Laterality: N/A;  . LEFT HEART CATH AND CORONARY ANGIOGRAPHY N/A 10/31/2018   Procedure: LEFT HEART CATH AND CORONARY ANGIOGRAPHY;  Surgeon: Leonie Man, MD;  Location: [facility];  Service: Cardiovascular;  Laterality: N/A;  . PACEMAKER GENERATOR CHANGE  09/2018   in [place] ([facility]    Current Medications: No outpatient medications have been marked as taking for the 10/31/20 encounter (Appointment) with Imogene Burn, PA-C.     Allergies:   Oxycodone-acetaminophen, Contrast media [iodinated diagnostic agents], No known allergies, Oxycodone, and Xarelto  [rivaroxaban]   Social History   Socioeconomic History  . Marital status: Single    Spouse name: Not on file  . Number of children: 4  . Years of education: Not on file  . Highest education level: Not on file  Occupational History  . Not on file  Tobacco Use  . Smoking status:  Former Smoker    Years: 3.00    Quit date: 12/09/1969    Years since quitting: 50.8  . Smokeless tobacco: Never Used  Substance and Sexual Activity  . Alcohol use: Not Currently  . Drug use: Never  . Sexual activity: Not on file  Other Topics Concern  . Not on file  Social History Narrative   Moved from [place] to be with daughter in [place].   Now in [employer]   Social Determinants of Health   Financial  Resource Strain:   . Difficulty of Paying Living Expenses: Not on file  Food Insecurity:   . Worried About Charity fundraiser in the Last Year: Not on file  . Ran Out of Food in the Last Year: Not on file  Transportation Needs:   . Lack of Transportation (Medical): Not on file  . Lack of Transportation (Non-Medical): Not on file  Physical Activity:   . Days of Exercise per Week: Not on file  . Minutes of Exercise per Session: Not on file  Stress:   . Feeling of Stress : Not on file  Social Connections:   . Frequency of Communication with Friends and Family: Not on file  . Frequency of Social Gatherings with Friends and Family: Not on file  . Attends Religious Services: Not on file  . Active Member of Clubs or Organizations: Not on file  . Attends Archivist Meetings: Not on file  . Marital Status: Not on file     Family History:  The patient's ***Family history is unknown by patient.   ROS:   Please see the history of present illness.    ROS All other systems reviewed and are negative.   PHYSICAL EXAM:   VS:  There were no vitals taken for this visit.  Physical Exam  GEN: Well nourished, well developed, in no acute distress  HEENT: normal  Neck: no JVD, carotid bruits, or masses Cardiac:RRR; no murmurs, rubs, or gallops  Respiratory:  clear to auscultation bilaterally, normal work of breathing GI: soft, nontender, nondistended, + BS Ext: without cyanosis, clubbing, or edema, Good distal pulses bilaterally MS: no deformity or atrophy  Skin: warm and dry, no rash Neuro:  Alert and Oriented x 3, Strength and sensation are intact Psych: euthymic mood, full affect  Wt Readings from Last 3 Encounters:  10/10/20 164 lb (74.4 kg)  09/28/20 156 lb 1.6 oz (70.8 kg)  08/11/20 167 lb (75.8 kg)      Studies/Labs Reviewed:   EKG:  EKG is*** ordered today.  The ekg ordered today demonstrates ***  Recent Labs: 09/22/2020: ALT 16; TSH 2.207 09/26/2020: Hemoglobin  13.5; Platelets 164 09/28/2020: Magnesium 2.0 10/10/2020: BUN 23; Creatinine, Ser 1.83; Potassium 3.7; Sodium 135   Lipid Panel    Component Value Date/Time   CHOL 153 11/01/2018 0516   TRIG 175 (H) 11/01/2018 0516   HDL 24 (L) 11/01/2018 0516   CHOLHDL 6.4 11/01/2018 0516   VLDL 35 11/01/2018 0516   LDLCALC 94 11/01/2018 0516    Additional studies/ records that were reviewed today include:  EGD 09/28/2020:   Impression: - Normal larynx. - Normal esophagus. - Bilious gastric fluid. - Erythematous mucosa in the antrum. Biopsied. - Normal examined duodenum. Biopsied. - The presence of bile reflux suggests gastric dysmotility or bile reflux gastritis as possible causes for the patient's symptoms.            ASSESSMENT:    No diagnosis found.   PLAN:  In order of problems listed above:  CAD cath in 2019 mild diffuse disease, no angina  Hypertension blood pressure has been up and down but she has had recent falls so hydralazine decreased to 25 mg once daily last office visit.  Also asked to stay hydrated  Permanent pacemaker for symptomatic bradycardia followed by Dr. Lovena Le  CKD stage III creatinine 1.83 10/10/2020    Medication Adjustments/Labs and Tests Ordered: Current medicines are reviewed at length with the patient today.  Concerns regarding medicines are outlined above.  Medication changes, Labs and Tests ordered today are listed in the Patient Instructions below. There are no Patient Instructions on file for this visit.   Sumner Boast, PA-C  10/17/2020 12:30 PM    [facility] [address], [place], [place]  [address] Phone: [phone]; Fax: [phone]

## 2020-10-18 DIAGNOSIS — M6281 Muscle weakness (generalized): Secondary | ICD-10-CM | POA: Diagnosis not present

## 2020-10-18 DIAGNOSIS — R112 Nausea with vomiting, unspecified: Secondary | ICD-10-CM | POA: Diagnosis not present

## 2020-10-18 DIAGNOSIS — E210 Primary hyperparathyroidism: Secondary | ICD-10-CM | POA: Diagnosis not present

## 2020-10-18 DIAGNOSIS — E1142 Type 2 diabetes mellitus with diabetic polyneuropathy: Secondary | ICD-10-CM | POA: Diagnosis not present

## 2020-10-18 DIAGNOSIS — I130 Hypertensive heart and chronic kidney disease with heart failure and stage 1 through stage 4 chronic kidney disease, or unspecified chronic kidney disease: Secondary | ICD-10-CM | POA: Diagnosis not present

## 2020-10-18 DIAGNOSIS — K5909 Other constipation: Secondary | ICD-10-CM | POA: Diagnosis not present

## 2020-10-19 DIAGNOSIS — R112 Nausea with vomiting, unspecified: Secondary | ICD-10-CM | POA: Diagnosis not present

## 2020-10-19 DIAGNOSIS — I130 Hypertensive heart and chronic kidney disease with heart failure and stage 1 through stage 4 chronic kidney disease, or unspecified chronic kidney disease: Secondary | ICD-10-CM | POA: Diagnosis not present

## 2020-10-19 DIAGNOSIS — M6281 Muscle weakness (generalized): Secondary | ICD-10-CM | POA: Diagnosis not present

## 2020-10-19 DIAGNOSIS — E1142 Type 2 diabetes mellitus with diabetic polyneuropathy: Secondary | ICD-10-CM | POA: Diagnosis not present

## 2020-10-19 DIAGNOSIS — E210 Primary hyperparathyroidism: Secondary | ICD-10-CM | POA: Diagnosis not present

## 2020-10-19 DIAGNOSIS — K5909 Other constipation: Secondary | ICD-10-CM | POA: Diagnosis not present

## 2020-10-23 DIAGNOSIS — K5909 Other constipation: Secondary | ICD-10-CM | POA: Diagnosis not present

## 2020-10-23 DIAGNOSIS — E1142 Type 2 diabetes mellitus with diabetic polyneuropathy: Secondary | ICD-10-CM | POA: Diagnosis not present

## 2020-10-23 DIAGNOSIS — I130 Hypertensive heart and chronic kidney disease with heart failure and stage 1 through stage 4 chronic kidney disease, or unspecified chronic kidney disease: Secondary | ICD-10-CM | POA: Diagnosis not present

## 2020-10-23 DIAGNOSIS — M6281 Muscle weakness (generalized): Secondary | ICD-10-CM | POA: Diagnosis not present

## 2020-10-23 DIAGNOSIS — E210 Primary hyperparathyroidism: Secondary | ICD-10-CM | POA: Diagnosis not present

## 2020-10-23 DIAGNOSIS — R112 Nausea with vomiting, unspecified: Secondary | ICD-10-CM | POA: Diagnosis not present

## 2020-10-24 DIAGNOSIS — K5909 Other constipation: Secondary | ICD-10-CM | POA: Diagnosis not present

## 2020-10-24 DIAGNOSIS — E1142 Type 2 diabetes mellitus with diabetic polyneuropathy: Secondary | ICD-10-CM | POA: Diagnosis not present

## 2020-10-24 DIAGNOSIS — I130 Hypertensive heart and chronic kidney disease with heart failure and stage 1 through stage 4 chronic kidney disease, or unspecified chronic kidney disease: Secondary | ICD-10-CM | POA: Diagnosis not present

## 2020-10-24 DIAGNOSIS — E210 Primary hyperparathyroidism: Secondary | ICD-10-CM | POA: Diagnosis not present

## 2020-10-24 DIAGNOSIS — R112 Nausea with vomiting, unspecified: Secondary | ICD-10-CM | POA: Diagnosis not present

## 2020-10-24 DIAGNOSIS — M6281 Muscle weakness (generalized): Secondary | ICD-10-CM | POA: Diagnosis not present

## 2020-10-25 DIAGNOSIS — K5909 Other constipation: Secondary | ICD-10-CM | POA: Diagnosis not present

## 2020-10-25 DIAGNOSIS — E210 Primary hyperparathyroidism: Secondary | ICD-10-CM | POA: Diagnosis not present

## 2020-10-25 DIAGNOSIS — E1142 Type 2 diabetes mellitus with diabetic polyneuropathy: Secondary | ICD-10-CM | POA: Diagnosis not present

## 2020-10-25 DIAGNOSIS — R112 Nausea with vomiting, unspecified: Secondary | ICD-10-CM | POA: Diagnosis not present

## 2020-10-25 DIAGNOSIS — M6281 Muscle weakness (generalized): Secondary | ICD-10-CM | POA: Diagnosis not present

## 2020-10-25 DIAGNOSIS — I130 Hypertensive heart and chronic kidney disease with heart failure and stage 1 through stage 4 chronic kidney disease, or unspecified chronic kidney disease: Secondary | ICD-10-CM | POA: Diagnosis not present

## 2020-10-27 DIAGNOSIS — K5909 Other constipation: Secondary | ICD-10-CM | POA: Diagnosis not present

## 2020-10-27 DIAGNOSIS — E210 Primary hyperparathyroidism: Secondary | ICD-10-CM | POA: Diagnosis not present

## 2020-10-27 DIAGNOSIS — M6281 Muscle weakness (generalized): Secondary | ICD-10-CM | POA: Diagnosis not present

## 2020-10-27 DIAGNOSIS — I130 Hypertensive heart and chronic kidney disease with heart failure and stage 1 through stage 4 chronic kidney disease, or unspecified chronic kidney disease: Secondary | ICD-10-CM | POA: Diagnosis not present

## 2020-10-27 DIAGNOSIS — R112 Nausea with vomiting, unspecified: Secondary | ICD-10-CM | POA: Diagnosis not present

## 2020-10-27 DIAGNOSIS — E1142 Type 2 diabetes mellitus with diabetic polyneuropathy: Secondary | ICD-10-CM | POA: Diagnosis not present

## 2020-10-28 ENCOUNTER — Emergency Department (HOSPITAL_COMMUNITY)
Admission: EM | Admit: 2020-10-28 | Discharge: 2020-10-28 | Disposition: A | Discharge status: Home / Self Care | Payer: Medicare Other | Attending: Emergency Medicine | Admitting: Emergency Medicine

## 2020-10-28 ENCOUNTER — Encounter (HOSPITAL_COMMUNITY): Payer: Self-pay

## 2020-10-28 ENCOUNTER — Other Ambulatory Visit: Payer: Self-pay

## 2020-10-28 ENCOUNTER — Emergency Department (HOSPITAL_COMMUNITY): Payer: Medicare Other

## 2020-10-28 DIAGNOSIS — K838 Other specified diseases of biliary tract: Secondary | ICD-10-CM | POA: Diagnosis not present

## 2020-10-28 DIAGNOSIS — N281 Cyst of kidney, acquired: Secondary | ICD-10-CM | POA: Diagnosis not present

## 2020-10-28 DIAGNOSIS — Z794 Long term (current) use of insulin: Secondary | ICD-10-CM | POA: Insufficient documentation

## 2020-10-28 DIAGNOSIS — R1032 Left lower quadrant pain: Secondary | ICD-10-CM | POA: Diagnosis not present

## 2020-10-28 DIAGNOSIS — I2510 Atherosclerotic heart disease of native coronary artery without angina pectoris: Secondary | ICD-10-CM | POA: Diagnosis not present

## 2020-10-28 DIAGNOSIS — I130 Hypertensive heart and chronic kidney disease with heart failure and stage 1 through stage 4 chronic kidney disease, or unspecified chronic kidney disease: Secondary | ICD-10-CM | POA: Insufficient documentation

## 2020-10-28 DIAGNOSIS — Z87891 Personal history of nicotine dependence: Secondary | ICD-10-CM | POA: Insufficient documentation

## 2020-10-28 DIAGNOSIS — R0902 Hypoxemia: Secondary | ICD-10-CM | POA: Diagnosis not present

## 2020-10-28 DIAGNOSIS — R112 Nausea with vomiting, unspecified: Secondary | ICD-10-CM | POA: Insufficient documentation

## 2020-10-28 DIAGNOSIS — I313 Pericardial effusion (noninflammatory): Secondary | ICD-10-CM | POA: Diagnosis not present

## 2020-10-28 DIAGNOSIS — R52 Pain, unspecified: Secondary | ICD-10-CM | POA: Diagnosis not present

## 2020-10-28 DIAGNOSIS — Z79899 Other long term (current) drug therapy: Secondary | ICD-10-CM | POA: Diagnosis not present

## 2020-10-28 DIAGNOSIS — R197 Diarrhea, unspecified: Secondary | ICD-10-CM | POA: Insufficient documentation

## 2020-10-28 DIAGNOSIS — R109 Unspecified abdominal pain: Secondary | ICD-10-CM

## 2020-10-28 DIAGNOSIS — N3001 Acute cystitis with hematuria: Secondary | ICD-10-CM | POA: Insufficient documentation

## 2020-10-28 DIAGNOSIS — I5032 Chronic diastolic (congestive) heart failure: Secondary | ICD-10-CM | POA: Insufficient documentation

## 2020-10-28 DIAGNOSIS — N183 Chronic kidney disease, stage 3 unspecified: Secondary | ICD-10-CM | POA: Diagnosis not present

## 2020-10-28 DIAGNOSIS — Z7982 Long term (current) use of aspirin: Secondary | ICD-10-CM | POA: Diagnosis not present

## 2020-10-28 DIAGNOSIS — I3139 Other pericardial effusion (noninflammatory): Secondary | ICD-10-CM

## 2020-10-28 DIAGNOSIS — I10 Essential (primary) hypertension: Secondary | ICD-10-CM | POA: Diagnosis not present

## 2020-10-28 DIAGNOSIS — D3500 Benign neoplasm of unspecified adrenal gland: Secondary | ICD-10-CM | POA: Diagnosis not present

## 2020-10-28 DIAGNOSIS — Z950 Presence of cardiac pacemaker: Secondary | ICD-10-CM | POA: Insufficient documentation

## 2020-10-28 DIAGNOSIS — M4316 Spondylolisthesis, lumbar region: Secondary | ICD-10-CM | POA: Diagnosis not present

## 2020-10-28 DIAGNOSIS — R1084 Generalized abdominal pain: Secondary | ICD-10-CM | POA: Diagnosis not present

## 2020-10-28 DIAGNOSIS — E1122 Type 2 diabetes mellitus with diabetic chronic kidney disease: Secondary | ICD-10-CM | POA: Diagnosis not present

## 2020-10-28 DIAGNOSIS — R42 Dizziness and giddiness: Secondary | ICD-10-CM | POA: Diagnosis not present

## 2020-10-28 DIAGNOSIS — E278 Other specified disorders of adrenal gland: Secondary | ICD-10-CM | POA: Diagnosis not present

## 2020-10-28 LAB — CBC WITH DIFFERENTIAL/PLATELET
Abs Immature Granulocytes: 0.12 10*3/uL — ABNORMAL HIGH (ref 0.00–0.07)
Basophils Absolute: 0.1 10*3/uL (ref 0.0–0.1)
Basophils Relative: 0 %
Eosinophils Absolute: 0.2 10*3/uL (ref 0.0–0.5)
Eosinophils Relative: 1 %
HCT: 43.7 % (ref 36.0–46.0)
Hemoglobin: 14.9 g/dL (ref 12.0–15.0)
Immature Granulocytes: 1 %
Lymphocytes Relative: 6 %
Lymphs Abs: 1.3 10*3/uL (ref 0.7–4.0)
MCH: 31.4 pg (ref 26.0–34.0)
MCHC: 34.1 g/dL (ref 30.0–36.0)
MCV: 92 fL (ref 80.0–100.0)
Monocytes Absolute: 1.6 10*3/uL — ABNORMAL HIGH (ref 0.1–1.0)
Monocytes Relative: 7 %
Neutro Abs: 18.5 10*3/uL — ABNORMAL HIGH (ref 1.7–7.7)
Neutrophils Relative %: 85 %
Platelets: 215 10*3/uL (ref 150–400)
RBC: 4.75 MIL/uL (ref 3.87–5.11)
RDW: 12.3 % (ref 11.5–15.5)
WBC: 21.9 10*3/uL — ABNORMAL HIGH (ref 4.0–10.5)
nRBC: 0 % (ref 0.0–0.2)

## 2020-10-28 LAB — URINALYSIS, ROUTINE W REFLEX MICROSCOPIC
Bilirubin Urine: NEGATIVE
Glucose, UA: 50 mg/dL — AB
Ketones, ur: NEGATIVE mg/dL
Nitrite: NEGATIVE
Protein, ur: 30 mg/dL — AB
Specific Gravity, Urine: 1.01 (ref 1.005–1.030)
pH: 6 (ref 5.0–8.0)

## 2020-10-28 LAB — PHOSPHORUS: Phosphorus: 2.5 mg/dL (ref 2.5–4.6)

## 2020-10-28 LAB — COMPREHENSIVE METABOLIC PANEL
ALT: 12 U/L (ref 0–44)
AST: 19 U/L (ref 15–41)
Albumin: 3.7 g/dL (ref 3.5–5.0)
Alkaline Phosphatase: 101 U/L (ref 38–126)
Anion gap: 9 (ref 5–15)
BUN: 25 mg/dL — ABNORMAL HIGH (ref 8–23)
CO2: 21 mmol/L — ABNORMAL LOW (ref 22–32)
Calcium: 11.5 mg/dL — ABNORMAL HIGH (ref 8.9–10.3)
Chloride: 106 mmol/L (ref 98–111)
Creatinine, Ser: 1.98 mg/dL — ABNORMAL HIGH (ref 0.44–1.00)
GFR, Estimated: 27 mL/min — ABNORMAL LOW (ref >60)
Glucose, Bld: 133 mg/dL — ABNORMAL HIGH (ref 70–99)
Potassium: 4 mmol/L (ref 3.5–5.1)
Sodium: 136 mmol/L (ref 135–145)
Total Bilirubin: 1 mg/dL (ref 0.3–1.2)
Total Protein: 6.7 g/dL (ref 6.5–8.1)

## 2020-10-28 LAB — TROPONIN I (HIGH SENSITIVITY): Troponin I (High Sensitivity): 17 ng/L (ref <18)

## 2020-10-28 LAB — MAGNESIUM: Magnesium: 2.1 mg/dL (ref 1.7–2.4)

## 2020-10-28 MED ORDER — METOCLOPRAMIDE HCL 5 MG/ML IJ SOLN
5.0000 mg | Freq: Once | INTRAMUSCULAR | Status: AC
  Administered 2020-10-28: 5 mg via INTRAVENOUS
  Filled 2020-10-28: qty 2

## 2020-10-28 MED ORDER — CEPHALEXIN 500 MG PO CAPS: 500.0000 mg | ORAL_CAPSULE | Freq: Four times a day (QID) | ORAL | 0 refills | Status: AC

## 2020-10-28 MED ORDER — ONDANSETRON HCL 4 MG PO TABS: 4.0000 mg | ORAL_TABLET | Freq: Four times a day (QID) | ORAL | 0 refills | Status: DC

## 2020-10-28 MED ORDER — FENTANYL CITRATE (PF) 100 MCG/2ML IJ SOLN
25.0000 ug | Freq: Once | INTRAMUSCULAR | Status: AC
  Administered 2020-10-28: 25 ug via INTRAVENOUS
  Filled 2020-10-28: qty 2

## 2020-10-28 MED ORDER — SODIUM CHLORIDE 0.9 % IV BOLUS
1000.0000 mL | Freq: Once | INTRAVENOUS | Status: AC
  Administered 2020-10-28: 1000 mL via INTRAVENOUS

## 2020-10-28 MED ORDER — SODIUM CHLORIDE 0.9 % IV SOLN
1.0000 g | Freq: Once | INTRAVENOUS | Status: AC
  Administered 2020-10-28: 1 g via INTRAVENOUS
  Filled 2020-10-28: qty 10

## 2020-10-28 NOTE — Discharge Instructions (Addendum)
You were given a prescription for antibiotics. Please take the antibiotic prescription fully.   A culture was sent of your urine today to determine if there is any bacterial growth. If the results of the culture are positive and you require an antibiotic or a change of your prescribed antibiotic you will be contacted by the hospital. If the results are negative you will not be contacted.  There were some incidental findings on your CT scan today which showed cysts in your kidneys.  These should not cause you any pain and appear to be benign.  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  Usually these are not clinically significant.  You should follow-up with your regular doctor about this.  You were also found to have some fluid around your heart which was very minimal.  You will need to follow-up with your regular doctor about this as you may need to have an ultrasound of your heart to follow-up about this.    Please make an appointment to follow-up with your regular doctor in the next 3 to 5 days for reassessment.  If you have any new or worsening symptoms including fevers, inability to tolerate fluids, worsening pain, shortness of breath, chest pain or any other concerns please return to the emergency department immediately.

## 2020-10-28 NOTE — ED Triage Notes (Signed)
Pt reports abd pain, n/v/d x 3 days.  EMS reports pt orthostatic, bp 28'B systolic with standing.  Reports was admitted last month for same.  EMS gave 500 ml bolus nss and zofran 4mg  pta.

## 2020-10-28 NOTE — ED Provider Notes (Signed)
[facility] EMERGENCY DEPARTMENT Provider Note   CSN: [id number] Arrival date & time: 10/28/20  1733     History Chief Complaint  Patient presents with  . Abdominal Pain    Melissa Lopez is a 69 y.o. female.  HPI   Patient is a 69 year old female with a history of CKD, CAD, hypertension, pacemaker, sinus tach syndrome, diabetes, who presents to the emergency department today for evaluation of abdominal pain nausea vomiting and diarrhea for the last 3 days. States she has not been able to keep anything down including. Pain in the abdomen is located to the LLQ. Pain is intermittent in nature. States pain is sharp and rated 6/10. Denies fever.   She reports some dysuria and urinary frequency.   States she has not used THC since she was discharged from the hospital.   Past Medical History:  Diagnosis Date  . CKD (chronic kidney disease) stage 3, GFR 30-59 ml/min (HCC) 10/31/2018   - per patient report  . Coronary artery disease    per patient report - 2 caths with non-obstructive CAD  . Essential hypertension   . H/O cardiac pacemaker 10/31/2018  . Sick sinus syndrome ([place])   . Type 2 diabetes mellitus with complication, with long-term current use of insulin ([place]) 10/31/2018   with diabetic nephropathy    Patient Active Problem List   Diagnosis Date Noted  . Hyperparathyroidism, primary ([place]) 09/25/2020  . Emesis   . Hypophosphatemia   . Hypokalemia 09/22/2020  . Chronic diastolic CHF (congestive heart failure) ([place]) 09/22/2020  . Noninfective gastroenteritis and colitis, unspecified 04/13/2020  . Unilateral primary osteoarthritis, right knee 04/13/2020  . Arthritis 04/13/2020  . Bradycardia 04/13/2020  . Cardiomyopathy, 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[place]) 04/13/2020  . Class 1 obesity due to excess calories with body mass index (BMI) of 34.0 to 34.9 in adult 04/13/2020  . Acute kidney failure, unspecified ([place]) 04/29/2019  . Nausea with vomiting 11/10/2018  . Malnutrition of  moderate degree 11/09/2018  . AKI (acute kidney injury) ([place])   . PE (pulmonary thromboembolism) ([place])   . H/O cardiac pacemaker 10/31/2018  . Syncope 10/31/2018  . ST elevation - with non-obstructive CAD 10/31/2018  . Chest pain with high risk of acute coronary syndrome 10/31/2018  . 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[place]) 10/31/2018  . Type 2 diabetes mellitus with complication, with long-term current use of insulin ([place]) 10/31/2018  . CKD (chronic kidney disease) stage 3, GFR 30-59 ml/min (HCC) - per patient report 10/31/2018  . Presence of cardiac pacemaker 10/31/2018  . Essential hypertension   . Hypercalcemia 07/27/2015  . STEMI (ST elevation myocardial infarction) ([place]) 03/27/2012    Past Surgical History:  Procedure Laterality Date  . BIOPSY  09/28/2020   Procedure: BIOPSY;  Surgeon: Ronald Lobo, MD;  Location: [facility];  Service: Endoscopy;;  . CARDIAC PACEMAKER PLACEMENT  2008   in [place] for sick sinus syndrome  . ESOPHAGOGASTRODUODENOSCOPY N/A 09/28/2020   Procedure: ESOPHAGOGASTRODUODENOSCOPY (EGD);  Surgeon: Ronald Lobo, MD;  Location: [facility];  Service: Endoscopy;  Laterality: N/A;  . ESOPHAGOGASTRODUODENOSCOPY (EGD) WITH PROPOFOL N/A 11/10/2018   Procedure: ESOPHAGOGASTRODUODENOSCOPY (EGD) WITH PROPOFOL;  Surgeon: Wilford Corner, MD;  Location: [facility];  Service: Endoscopy;  Laterality: N/A;  . LEFT HEART CATH AND CORONARY ANGIOGRAPHY N/A 10/31/2018   Procedure: LEFT HEART CATH AND CORONARY ANGIOGRAPHY;  Surgeon: Leonie Man, MD;  Location: [facility];  Service: Cardiovascular;  Laterality: N/A;  . PACEMAKER GENERATOR CHANGE  09/2018  in [place]     OB History   No obstetric history on file.     Family History  Family history unknown: Yes    Social History   Tobacco Use  . Smoking status: Former Smoker    Years: 3.00    Quit date: 12/09/1969    Years since quitting: 50.9  . Smokeless  tobacco: Never Used  Substance Use Topics  . Alcohol use: Not Currently  . Drug use: Never    Home Medications Prior to Admission medications   Medication Sig Start Date End Date Taking? Authorizing Provider  amLODipine (NORVASC) 10 MG tablet TAKE 1 TABLET BY MOUTH EVERY DAY 10/10/20   Imogene Burn, PA-C  aspirin 81 MG chewable tablet Chew 1 tablet (81 mg total) by mouth daily. 11/13/18   Dessa Phi, DO  buPROPion (WELLBUTRIN SR) 150 MG 12 hr tablet Take 150 mg by mouth daily. 07/13/20   [provider]  carvedilol (COREG) 6.25 MG tablet Take 1 tablet (6.25 mg total) by mouth 2 (two) times daily. 10/10/20   Imogene Burn, PA-C  cephALEXin (KEFLEX) 500 MG capsule Take 1 capsule (500 mg total) by mouth 4 (four) times daily for 10 days. 10/28/20 11/07/20  Klaus Casteneda S, PA-C  escitalopram (LEXAPRO) 20 MG tablet Take 20 mg by mouth daily. 08/11/20   [provider]  gabapentin (NEURONTIN) 100 MG capsule Take 100 mg by mouth 3 (three) times daily. 08/11/20   [provider]  hydrALAZINE (APRESOLINE) 50 MG tablet Take 50 mg by mouth 3 (three) times daily. 10/11/20   [provider]  insulin degludec (TRESIBA FLEXTOUCH) 100 UNIT/ML FlexTouch Pen Inject 5 Units into the skin daily.    [provider]  lisinopril (ZESTRIL) 20 MG tablet Take 1 tablet (20 mg total) by mouth daily. 10/10/20 01/08/21  Imogene Burn, PA-C  LORazepam (ATIVAN) 1 MG tablet Take 1 mg by mouth daily as needed for anxiety. 05/10/20   [provider]  metoCLOPramide (REGLAN) 5 MG tablet Take by mouth. 10/11/20   [provider]  Multiple Vitamin (MULTIVITAMIN WITH MINERALS) TABS tablet Take 1 tablet by mouth daily. 09/30/20   Hongalgi, Lenis Dickinson, MD  Nutritional Supplements (,FEEDING SUPPLEMENT, PROSOURCE PLUS) liquid Take 30 mLs by mouth daily. 09/29/20   Hongalgi, Lenis Dickinson, MD  omeprazole (PRILOSEC) 20 MG capsule Take 20 mg by mouth daily.    [provider]   ondansetron (ZOFRAN) 4 MG tablet Take 1 tablet (4 mg total) by mouth every 6 (six) hours. 10/28/20   Jvon Meroney S, PA-C  oxybutynin (DITROPAN-XL) 10 MG 24 hr tablet Take 10 mg by mouth at bedtime.    [provider]  polyethylene glycol (MIRALAX / GLYCOLAX) 17 g packet Take 17 g by mouth 2 (two) times daily. 09/29/20   Modena Jansky, MD  rosuvastatin (CRESTOR) 5 MG tablet Take one tablet daily 10/10/20   Imogene Burn, PA-C  traZODone (DESYREL) 100 MG tablet Take 100 mg by mouth at bedtime.    [provider]    Allergies    Oxycodone-acetaminophen, Contrast media [iodinated diagnostic agents], No known allergies, Oxycodone, and Xarelto  [rivaroxaban]  Review of Systems   Review of Systems  Constitutional: Negative for chills and fever.  HENT: Negative for ear pain and sore throat.   Eyes: Negative for visual disturbance.  Respiratory: Negative for cough and shortness of breath.   Cardiovascular: Negative for chest pain.  Gastrointestinal: Positive for  abdominal pain, diarrhea, nausea and vomiting. Negative for constipation.  Genitourinary: Negative for dysuria and hematuria.  Musculoskeletal: Negative for back pain.  Skin: Negative for rash.  Neurological: Negative for headaches.  All other systems reviewed and are negative.   Physical Exam Updated Vital Signs BP 118/75   Pulse 60   Temp 97.9 F (36.6 C) (Oral)   Resp 19   Ht 5\' 4"  (1.626 m)   Wt 74 kg   SpO2 94%   BMI 28.00 kg/m   Physical Exam Vitals and nursing note reviewed.  Constitutional:      General: She is not in acute distress.    Appearance: She is well-developed.  HENT:     Head: Normocephalic and atraumatic.  Eyes:     Conjunctiva/sclera: Conjunctivae normal.  Cardiovascular:     Rate and Rhythm: Normal rate and regular rhythm.     Heart sounds: Normal heart sounds. No murmur heard.   Pulmonary:     Effort: Pulmonary effort is normal. No respiratory distress.      Breath sounds: Normal breath sounds. No wheezing, rhonchi or rales.  Abdominal:     General: Bowel sounds are normal.     Palpations: Abdomen is soft.     Tenderness: There is abdominal tenderness in the right lower quadrant and left lower quadrant. There is no guarding or rebound.  Musculoskeletal:     Cervical back: Neck supple.  Skin:    General: Skin is warm and dry.  Neurological:     Mental Status: She is alert.     ED Results / Procedures / Treatments   Labs (all labs ordered are listed, but only abnormal results are displayed) Labs Reviewed  CBC WITH DIFFERENTIAL/PLATELET - Abnormal; Notable for the following components:      Result Value   WBC 21.9 (*)    Neutro Abs 18.5 (*)    Monocytes Absolute 1.6 (*)    Abs Immature Granulocytes 0.12 (*)    All other components within normal limits  COMPREHENSIVE METABOLIC PANEL - Abnormal; Notable for the following components:   CO2 21 (*)    Glucose, Bld 133 (*)    BUN 25 (*)    Creatinine, Ser 1.98 (*)    Calcium 11.5 (*)    GFR, Estimated 27 (*)    All other components within normal limits  URINALYSIS, ROUTINE W REFLEX MICROSCOPIC - Abnormal; Notable for the following components:   APPearance HAZY (*)    Glucose, UA 50 (*)    Hgb urine dipstick SMALL (*)    Protein, ur 30 (*)    Leukocytes,Ua MODERATE (*)    Bacteria, UA RARE (*)    All other components within normal limits  URINE CULTURE  MAGNESIUM  PHOSPHORUS  TROPONIN I (HIGH SENSITIVITY)  TROPONIN I (HIGH SENSITIVITY)    EKG EKG Interpretation  Date/Time:  Saturday October 28 2020 17:45:57 EST Ventricular Rate:  60 PR Interval:    QRS Duration: 101 QT Interval:  482 QTC Calculation: 482 R Axis:   58 Text Interpretation: Sinus rhythm Prolonged PR interval Nonspecific repol abnormality, diffuse leads Borderline ST elevation, inferior leads Probable atrial paced rhythm Reconfirmed by Fredia Sorrow [phone]) on 10/28/2020 6:07:28 PM   Radiology CT  ABDOMEN PELVIS WO CONTRAST  Result Date: 10/28/2020 CLINICAL DATA:  Left lower quadrant pain. EXAM: CT ABDOMEN AND PELVIS WITHOUT CONTRAST TECHNIQUE: Multidetector CT imaging of the abdomen and pelvis was performed following the standard protocol without IV contrast. COMPARISON:  September 22, 2020 FINDINGS: Lower chest: A very small anterior pericardial effusion is seen. Hepatobiliary: No focal liver abnormality is seen. Mild intrahepatic biliary dilatation is noted within the right lobe. Status post cholecystectomy. Persistent common bile duct dilatation is seen (1.5 cm). Pancreas: Unremarkable. No pancreatic ductal dilatation or surrounding inflammatory changes. Spleen: Normal in size without focal abnormality. Adrenals/Urinary Tract: Stable bilateral subcentimeter low-attenuation adrenal gland nodules are seen. Kidneys are normal in size, without renal calculi or hydronephrosis. Innumerable bilateral renal cysts are present. Bladder is unremarkable. Stomach/Bowel: Stomach is within normal limits. The appendix is not clearly identified. No evidence of bowel wall thickening, distention, or inflammatory changes. Vascular/Lymphatic: There is moderate severity calcification of the abdominal aorta and bilateral common iliac arteries, without evidence of aneurysmal dilatation. No enlarged abdominal or pelvic lymph nodes. Reproductive: Status post hysterectomy. No adnexal masses. Other: No abdominal wall hernia or abnormality. No abdominopelvic ascites. Musculoskeletal: Approximately 2 mm to 3 mm retrolisthesis of the L3 vertebral body is seen on L4. Moderate to marked severity degenerative changes are also seen at this level. IMPRESSION: 1. Very small anterior pericardial effusion. 2. Stable bilateral subcentimeter low-attenuation adrenal gland nodules, likely consistent with adrenal adenomas. 3. Innumerable bilateral renal cysts. 4. Aortic atherosclerosis. Aortic Atherosclerosis (ICD10-I70.0). Electronically Signed    By: Virgina Norfolk M.D.   On: 10/28/2020 19:05    Procedures Procedures (including critical care time)  Medications Ordered in ED Medications  sodium chloride 0.9 % bolus 1,000 mL (0 mLs Intravenous Stopped 10/28/20 2153)  fentaNYL (SUBLIMAZE) injection 25 mcg (25 mcg Intravenous Given 10/28/20 1816)  metoCLOPramide (REGLAN) injection 5 mg (5 mg Intravenous Given 10/28/20 1819)  cefTRIAXone (ROCEPHIN) 1 g in sodium chloride 0.9 % 100 mL IVPB (1 g Intravenous New Bag/Given 10/28/20 2219)    ED Course  I have reviewed the triage vital signs and the nursing notes.  Pertinent labs & imaging results that were available during my care of the patient were reviewed by me and considered in my medical decision making (see chart for details).    MDM Rules/Calculators/A&P                          69 year old female presenting the emergency department today for evaluation of nausea vomiting diarrhea and abdominal pain that started 3 days ago.  She is also having some urinary symptoms as well.  Has had similar presentations in the past and required admission.  CBC showed leukocytosis at 21,000 CMP with low bicarb at 21, elevated BUN/creatinine at 25-1.90 which appears to be around her baseline.  LFTs are within normal limits. Ca elevated at 11.5 Troponin neg Magnesium neg Phosphorus neg  EKG with 58 Text Interpretation: Sinus rhythm Prolonged PR interval Nonspecific repol abnormality, diffuse leads Borderline ST elevation, inferior leads Probable atrial paced rhythm   CT abd/pelvis -  1. Very small anterior pericardial effusion. 2. Stable bilateral subcentimeter low-attenuation adrenal gland nodules, likely consistent with adrenal adenomas. 3. Innumerable bilateral renal cysts. 4. Aortic atherosclerosis. Aortic Atherosclerosis   Patient given IV fluids, antiemetics and analgesics.  On reassessment she feels improved.  She has had no further episodes of vomiting.  She was p.o. challenged and  passed without difficulty.  It does appear that she has a urinary tract infection.  She was given a dose of ceftriaxone here in the emergency department.  She is afebrile, nontoxic, nonseptic appearing. She was given antibiotics and Zofran for home.  Feel she is appropriate for  discharge home with close monitoring and follow-up.    Case was discussed with supervising physician, Dr. Bobby Rumpf personally evaluated the patient and is in agreement with the plan.  Final Clinical Impression(s) / ED Diagnoses Final diagnoses:  Abdominal pain, unspecified abdominal location  Acute cystitis with hematuria  Renal cyst  Adrenal adenoma, unspecified laterality  Pericardial effusion    Rx / DC Orders ED Discharge Orders         Ordered    cephALEXin (KEFLEX) 500 MG capsule  4 times daily        10/28/20 2258    ondansetron (ZOFRAN) 4 MG tablet  Every 6 hours        10/28/20 2258           Rodney Booze, PA-C 10/28/20 2259    Fredia Sorrow, MD 11/14/20 1426

## 2020-10-28 NOTE — ED Notes (Signed)
Pt tolerated PO Fluid Challenge without complication. EDP Notified.

## 2020-10-29 DIAGNOSIS — E1142 Type 2 diabetes mellitus with diabetic polyneuropathy: Secondary | ICD-10-CM | POA: Diagnosis not present

## 2020-10-29 DIAGNOSIS — I130 Hypertensive heart and chronic kidney disease with heart failure and stage 1 through stage 4 chronic kidney disease, or unspecified chronic kidney disease: Secondary | ICD-10-CM | POA: Diagnosis not present

## 2020-10-29 DIAGNOSIS — R112 Nausea with vomiting, unspecified: Secondary | ICD-10-CM | POA: Diagnosis not present

## 2020-10-29 DIAGNOSIS — E210 Primary hyperparathyroidism: Secondary | ICD-10-CM | POA: Diagnosis not present

## 2020-10-29 DIAGNOSIS — M6281 Muscle weakness (generalized): Secondary | ICD-10-CM | POA: Diagnosis not present

## 2020-10-29 DIAGNOSIS — K5909 Other constipation: Secondary | ICD-10-CM | POA: Diagnosis not present

## 2020-10-30 ENCOUNTER — Ambulatory Visit: Payer: Medicare Other | Admitting: "Endocrinology

## 2020-10-30 DIAGNOSIS — K5909 Other constipation: Secondary | ICD-10-CM | POA: Diagnosis not present

## 2020-10-30 DIAGNOSIS — R112 Nausea with vomiting, unspecified: Secondary | ICD-10-CM | POA: Diagnosis not present

## 2020-10-30 DIAGNOSIS — E1142 Type 2 diabetes mellitus with diabetic polyneuropathy: Secondary | ICD-10-CM | POA: Diagnosis not present

## 2020-10-30 DIAGNOSIS — I130 Hypertensive heart and chronic kidney disease with heart failure and stage 1 through stage 4 chronic kidney disease, or unspecified chronic kidney disease: Secondary | ICD-10-CM | POA: Diagnosis not present

## 2020-10-30 DIAGNOSIS — E210 Primary hyperparathyroidism: Secondary | ICD-10-CM | POA: Diagnosis not present

## 2020-10-30 DIAGNOSIS — M6281 Muscle weakness (generalized): Secondary | ICD-10-CM | POA: Diagnosis not present

## 2020-10-30 LAB — URINE CULTURE: Culture: NO GROWTH

## 2020-10-31 ENCOUNTER — Ambulatory Visit: Payer: Medicare Other | Admitting: Physician Assistant

## 2020-10-31 DIAGNOSIS — N1832 Chronic kidney disease, stage 3b: Secondary | ICD-10-CM | POA: Diagnosis not present

## 2020-10-31 DIAGNOSIS — E1122 Type 2 diabetes mellitus with diabetic chronic kidney disease: Secondary | ICD-10-CM | POA: Diagnosis not present

## 2020-10-31 DIAGNOSIS — F32A Depression, unspecified: Secondary | ICD-10-CM | POA: Diagnosis not present

## 2020-10-31 DIAGNOSIS — R634 Abnormal weight loss: Secondary | ICD-10-CM | POA: Diagnosis not present

## 2020-10-31 DIAGNOSIS — I2510 Atherosclerotic heart disease of native coronary artery without angina pectoris: Secondary | ICD-10-CM | POA: Diagnosis not present

## 2020-10-31 DIAGNOSIS — Z9181 History of falling: Secondary | ICD-10-CM | POA: Diagnosis not present

## 2020-10-31 DIAGNOSIS — F329 Major depressive disorder, single episode, unspecified: Secondary | ICD-10-CM | POA: Diagnosis not present

## 2020-10-31 DIAGNOSIS — R001 Bradycardia, unspecified: Secondary | ICD-10-CM | POA: Diagnosis not present

## 2020-10-31 DIAGNOSIS — F1211 Cannabis abuse, in remission: Secondary | ICD-10-CM | POA: Diagnosis not present

## 2020-10-31 DIAGNOSIS — I130 Hypertensive heart and chronic kidney disease with heart failure and stage 1 through stage 4 chronic kidney disease, or unspecified chronic kidney disease: Secondary | ICD-10-CM | POA: Diagnosis not present

## 2020-10-31 DIAGNOSIS — E876 Hypokalemia: Secondary | ICD-10-CM | POA: Diagnosis not present

## 2020-10-31 DIAGNOSIS — I495 Sick sinus syndrome: Secondary | ICD-10-CM | POA: Diagnosis not present

## 2020-10-31 DIAGNOSIS — E1165 Type 2 diabetes mellitus with hyperglycemia: Secondary | ICD-10-CM | POA: Diagnosis not present

## 2020-10-31 DIAGNOSIS — E785 Hyperlipidemia, unspecified: Secondary | ICD-10-CM | POA: Diagnosis not present

## 2020-10-31 DIAGNOSIS — M6281 Muscle weakness (generalized): Secondary | ICD-10-CM | POA: Diagnosis not present

## 2020-10-31 DIAGNOSIS — R112 Nausea with vomiting, unspecified: Secondary | ICD-10-CM | POA: Diagnosis not present

## 2020-10-31 DIAGNOSIS — E210 Primary hyperparathyroidism: Secondary | ICD-10-CM | POA: Diagnosis not present

## 2020-10-31 DIAGNOSIS — I5032 Chronic diastolic (congestive) heart failure: Secondary | ICD-10-CM | POA: Diagnosis not present

## 2020-10-31 DIAGNOSIS — K5909 Other constipation: Secondary | ICD-10-CM | POA: Diagnosis not present

## 2020-10-31 DIAGNOSIS — Z7982 Long term (current) use of aspirin: Secondary | ICD-10-CM | POA: Diagnosis not present

## 2020-10-31 DIAGNOSIS — Z87891 Personal history of nicotine dependence: Secondary | ICD-10-CM | POA: Diagnosis not present

## 2020-10-31 DIAGNOSIS — F419 Anxiety disorder, unspecified: Secondary | ICD-10-CM | POA: Diagnosis not present

## 2020-10-31 DIAGNOSIS — D72829 Elevated white blood cell count, unspecified: Secondary | ICD-10-CM | POA: Diagnosis not present

## 2020-10-31 DIAGNOSIS — E1142 Type 2 diabetes mellitus with diabetic polyneuropathy: Secondary | ICD-10-CM | POA: Diagnosis not present

## 2020-10-31 DIAGNOSIS — Z794 Long term (current) use of insulin: Secondary | ICD-10-CM | POA: Diagnosis not present

## 2020-10-31 DIAGNOSIS — Z950 Presence of cardiac pacemaker: Secondary | ICD-10-CM | POA: Diagnosis not present

## 2020-10-31 DIAGNOSIS — I700 Atherosclerosis of aorta: Secondary | ICD-10-CM | POA: Diagnosis not present

## 2020-11-01 DIAGNOSIS — I130 Hypertensive heart and chronic kidney disease with heart failure and stage 1 through stage 4 chronic kidney disease, or unspecified chronic kidney disease: Secondary | ICD-10-CM | POA: Diagnosis not present

## 2020-11-01 DIAGNOSIS — I2510 Atherosclerotic heart disease of native coronary artery without angina pectoris: Secondary | ICD-10-CM | POA: Diagnosis not present

## 2020-11-01 DIAGNOSIS — E1122 Type 2 diabetes mellitus with diabetic chronic kidney disease: Secondary | ICD-10-CM | POA: Diagnosis not present

## 2020-11-01 DIAGNOSIS — I5032 Chronic diastolic (congestive) heart failure: Secondary | ICD-10-CM | POA: Diagnosis not present

## 2020-11-01 DIAGNOSIS — E1142 Type 2 diabetes mellitus with diabetic polyneuropathy: Secondary | ICD-10-CM | POA: Diagnosis not present

## 2020-11-01 DIAGNOSIS — N1832 Chronic kidney disease, stage 3b: Secondary | ICD-10-CM | POA: Diagnosis not present

## 2020-11-07 ENCOUNTER — Ambulatory Visit: Payer: Medicare Other | Admitting: "Endocrinology

## 2020-11-09 ENCOUNTER — Emergency Department (HOSPITAL_COMMUNITY): Payer: Medicare Other

## 2020-11-09 ENCOUNTER — Inpatient Hospital Stay (HOSPITAL_COMMUNITY)
Admission: EM | Admit: 2020-11-09 | Discharge: 2020-11-17 | DRG: 287 | Disposition: A | Discharge status: Home Health | Payer: Medicare Other | Attending: Internal Medicine | Admitting: Internal Medicine

## 2020-11-09 ENCOUNTER — Encounter (HOSPITAL_COMMUNITY)
Admission: EM | Disposition: A | Discharge status: Home Health | Payer: Self-pay | Source: Home / Self Care | Attending: Internal Medicine

## 2020-11-09 ENCOUNTER — Other Ambulatory Visit: Payer: Self-pay

## 2020-11-09 ENCOUNTER — Inpatient Hospital Stay (HOSPITAL_COMMUNITY)
Admission: EM | Disposition: A | Discharge status: Home Health | Payer: Self-pay | Source: Home / Self Care | Attending: Internal Medicine

## 2020-11-09 ENCOUNTER — Encounter (HOSPITAL_COMMUNITY): Payer: Self-pay

## 2020-11-09 DIAGNOSIS — F419 Anxiety disorder, unspecified: Secondary | ICD-10-CM | POA: Diagnosis present

## 2020-11-09 DIAGNOSIS — E1122 Type 2 diabetes mellitus with diabetic chronic kidney disease: Secondary | ICD-10-CM | POA: Diagnosis present

## 2020-11-09 DIAGNOSIS — R5381 Other malaise: Secondary | ICD-10-CM | POA: Diagnosis not present

## 2020-11-09 DIAGNOSIS — Z7982 Long term (current) use of aspirin: Secondary | ICD-10-CM | POA: Diagnosis not present

## 2020-11-09 DIAGNOSIS — R079 Chest pain, unspecified: Principal | ICD-10-CM

## 2020-11-09 DIAGNOSIS — E1165 Type 2 diabetes mellitus with hyperglycemia: Secondary | ICD-10-CM | POA: Diagnosis not present

## 2020-11-09 DIAGNOSIS — Z87891 Personal history of nicotine dependence: Secondary | ICD-10-CM

## 2020-11-09 DIAGNOSIS — R1110 Vomiting, unspecified: Secondary | ICD-10-CM | POA: Diagnosis not present

## 2020-11-09 DIAGNOSIS — R778 Other specified abnormalities of plasma proteins: Secondary | ICD-10-CM | POA: Diagnosis present

## 2020-11-09 DIAGNOSIS — I213 ST elevation (STEMI) myocardial infarction of unspecified site: Secondary | ICD-10-CM | POA: Diagnosis present

## 2020-11-09 DIAGNOSIS — I10 Essential (primary) hypertension: Secondary | ICD-10-CM | POA: Diagnosis present

## 2020-11-09 DIAGNOSIS — K5909 Other constipation: Secondary | ICD-10-CM | POA: Diagnosis present

## 2020-11-09 DIAGNOSIS — Z86711 Personal history of pulmonary embolism: Secondary | ICD-10-CM | POA: Diagnosis not present

## 2020-11-09 DIAGNOSIS — Z79899 Other long term (current) drug therapy: Secondary | ICD-10-CM | POA: Diagnosis not present

## 2020-11-09 DIAGNOSIS — R634 Abnormal weight loss: Secondary | ICD-10-CM | POA: Diagnosis present

## 2020-11-09 DIAGNOSIS — R531 Weakness: Secondary | ICD-10-CM | POA: Diagnosis not present

## 2020-11-09 DIAGNOSIS — R9431 Abnormal electrocardiogram [ECG] [EKG]: Secondary | ICD-10-CM | POA: Diagnosis present

## 2020-11-09 DIAGNOSIS — E860 Dehydration: Secondary | ICD-10-CM

## 2020-11-09 DIAGNOSIS — I361 Nonrheumatic tricuspid (valve) insufficiency: Secondary | ICD-10-CM | POA: Diagnosis not present

## 2020-11-09 DIAGNOSIS — E876 Hypokalemia: Secondary | ICD-10-CM | POA: Diagnosis not present

## 2020-11-09 DIAGNOSIS — E0821 Diabetes mellitus due to underlying condition with diabetic nephropathy: Secondary | ICD-10-CM

## 2020-11-09 DIAGNOSIS — R112 Nausea with vomiting, unspecified: Secondary | ICD-10-CM | POA: Diagnosis not present

## 2020-11-09 DIAGNOSIS — Z20822 Contact with and (suspected) exposure to covid-19: Secondary | ICD-10-CM | POA: Diagnosis present

## 2020-11-09 DIAGNOSIS — N183 Chronic kidney disease, stage 3 unspecified: Secondary | ICD-10-CM | POA: Diagnosis present

## 2020-11-09 DIAGNOSIS — N1832 Chronic kidney disease, stage 3b: Secondary | ICD-10-CM | POA: Diagnosis not present

## 2020-11-09 DIAGNOSIS — Z950 Presence of cardiac pacemaker: Secondary | ICD-10-CM | POA: Diagnosis not present

## 2020-11-09 DIAGNOSIS — E785 Hyperlipidemia, unspecified: Secondary | ICD-10-CM | POA: Diagnosis present

## 2020-11-09 DIAGNOSIS — E1121 Type 2 diabetes mellitus with diabetic nephropathy: Secondary | ICD-10-CM | POA: Diagnosis present

## 2020-11-09 DIAGNOSIS — R0789 Other chest pain: Secondary | ICD-10-CM | POA: Diagnosis not present

## 2020-11-09 DIAGNOSIS — R197 Diarrhea, unspecified: Secondary | ICD-10-CM | POA: Diagnosis not present

## 2020-11-09 DIAGNOSIS — R1111 Vomiting without nausea: Secondary | ICD-10-CM | POA: Diagnosis not present

## 2020-11-09 DIAGNOSIS — I5032 Chronic diastolic (congestive) heart failure: Secondary | ICD-10-CM | POA: Diagnosis not present

## 2020-11-09 DIAGNOSIS — E118 Type 2 diabetes mellitus with unspecified complications: Secondary | ICD-10-CM | POA: Diagnosis not present

## 2020-11-09 DIAGNOSIS — F32A Depression, unspecified: Secondary | ICD-10-CM | POA: Diagnosis present

## 2020-11-09 DIAGNOSIS — I495 Sick sinus syndrome: Secondary | ICD-10-CM | POA: Diagnosis present

## 2020-11-09 DIAGNOSIS — Z794 Long term (current) use of insulin: Secondary | ICD-10-CM | POA: Diagnosis not present

## 2020-11-09 DIAGNOSIS — I2510 Atherosclerotic heart disease of native coronary artery without angina pectoris: Secondary | ICD-10-CM | POA: Diagnosis not present

## 2020-11-09 DIAGNOSIS — I491 Atrial premature depolarization: Secondary | ICD-10-CM | POA: Diagnosis not present

## 2020-11-09 DIAGNOSIS — D72829 Elevated white blood cell count, unspecified: Secondary | ICD-10-CM | POA: Diagnosis present

## 2020-11-09 DIAGNOSIS — I77810 Thoracic aortic ectasia: Secondary | ICD-10-CM | POA: Diagnosis present

## 2020-11-09 DIAGNOSIS — N281 Cyst of kidney, acquired: Secondary | ICD-10-CM | POA: Diagnosis not present

## 2020-11-09 DIAGNOSIS — R110 Nausea: Secondary | ICD-10-CM | POA: Diagnosis not present

## 2020-11-09 DIAGNOSIS — Z6826 Body mass index (BMI) 26.0-26.9, adult: Secondary | ICD-10-CM

## 2020-11-09 DIAGNOSIS — I517 Cardiomegaly: Secondary | ICD-10-CM | POA: Diagnosis not present

## 2020-11-09 DIAGNOSIS — E210 Primary hyperparathyroidism: Secondary | ICD-10-CM

## 2020-11-09 DIAGNOSIS — I130 Hypertensive heart and chronic kidney disease with heart failure and stage 1 through stage 4 chronic kidney disease, or unspecified chronic kidney disease: Secondary | ICD-10-CM | POA: Diagnosis present

## 2020-11-09 DIAGNOSIS — N2889 Other specified disorders of kidney and ureter: Secondary | ICD-10-CM | POA: Diagnosis not present

## 2020-11-09 DIAGNOSIS — I499 Cardiac arrhythmia, unspecified: Secondary | ICD-10-CM | POA: Diagnosis not present

## 2020-11-09 DIAGNOSIS — E1142 Type 2 diabetes mellitus with diabetic polyneuropathy: Secondary | ICD-10-CM | POA: Diagnosis not present

## 2020-11-09 HISTORY — PX: CORONARY/GRAFT ACUTE MI REVASCULARIZATION: CATH118305

## 2020-11-09 LAB — COMPREHENSIVE METABOLIC PANEL
ALT: 13 U/L (ref 0–44)
AST: 18 U/L (ref 15–41)
Albumin: 3.8 g/dL (ref 3.5–5.0)
Alkaline Phosphatase: 99 U/L (ref 38–126)
Anion gap: 13 (ref 5–15)
BUN: 31 mg/dL — ABNORMAL HIGH (ref 8–23)
CO2: 22 mmol/L (ref 22–32)
Calcium: 12.2 mg/dL — ABNORMAL HIGH (ref 8.9–10.3)
Chloride: 99 mmol/L (ref 98–111)
Creatinine, Ser: 1.76 mg/dL — ABNORMAL HIGH (ref 0.44–1.00)
GFR, Estimated: 31 mL/min — ABNORMAL LOW (ref >60)
Glucose, Bld: 243 mg/dL — ABNORMAL HIGH (ref 70–99)
Potassium: 3.1 mmol/L — ABNORMAL LOW (ref 3.5–5.1)
Sodium: 134 mmol/L — ABNORMAL LOW (ref 135–145)
Total Bilirubin: 0.7 mg/dL (ref 0.3–1.2)
Total Protein: 7.2 g/dL (ref 6.5–8.1)

## 2020-11-09 LAB — CBC WITH DIFFERENTIAL/PLATELET
Abs Immature Granulocytes: 0.22 10*3/uL — ABNORMAL HIGH (ref 0.00–0.07)
Basophils Absolute: 0.1 10*3/uL (ref 0.0–0.1)
Basophils Relative: 0 %
Eosinophils Absolute: 0 10*3/uL (ref 0.0–0.5)
Eosinophils Relative: 0 %
HCT: 44 % (ref 36.0–46.0)
Hemoglobin: 15.6 g/dL — ABNORMAL HIGH (ref 12.0–15.0)
Immature Granulocytes: 1 %
Lymphocytes Relative: 8 %
Lymphs Abs: 2.2 10*3/uL (ref 0.7–4.0)
MCH: 30.5 pg (ref 26.0–34.0)
MCHC: 35.5 g/dL (ref 30.0–36.0)
MCV: 85.9 fL (ref 80.0–100.0)
Monocytes Absolute: 1.6 10*3/uL — ABNORMAL HIGH (ref 0.1–1.0)
Monocytes Relative: 6 %
Neutro Abs: 24.5 10*3/uL — ABNORMAL HIGH (ref 1.7–7.7)
Neutrophils Relative %: 85 %
Platelets: 235 10*3/uL (ref 150–400)
RBC: 5.12 MIL/uL — ABNORMAL HIGH (ref 3.87–5.11)
RDW: 12.4 % (ref 11.5–15.5)
WBC: 28.6 10*3/uL — ABNORMAL HIGH (ref 4.0–10.5)
nRBC: 0 % (ref 0.0–0.2)

## 2020-11-09 LAB — GLUCOSE, CAPILLARY: Glucose-Capillary: 210 mg/dL — ABNORMAL HIGH (ref 70–99)

## 2020-11-09 LAB — BASIC METABOLIC PANEL
Anion gap: 12 (ref 5–15)
BUN: 23 mg/dL (ref 8–23)
CO2: 21 mmol/L — ABNORMAL LOW (ref 22–32)
Calcium: 11.6 mg/dL — ABNORMAL HIGH (ref 8.9–10.3)
Chloride: 104 mmol/L (ref 98–111)
Creatinine, Ser: 1.58 mg/dL — ABNORMAL HIGH (ref 0.44–1.00)
GFR, Estimated: 35 mL/min — ABNORMAL LOW (ref >60)
Glucose, Bld: 197 mg/dL — ABNORMAL HIGH (ref 70–99)
Potassium: 3.3 mmol/L — ABNORMAL LOW (ref 3.5–5.1)
Sodium: 137 mmol/L (ref 135–145)

## 2020-11-09 LAB — MAGNESIUM: Magnesium: 2.1 mg/dL (ref 1.7–2.4)

## 2020-11-09 LAB — URINALYSIS, ROUTINE W REFLEX MICROSCOPIC
Bacteria, UA: NONE SEEN
Bilirubin Urine: NEGATIVE
Glucose, UA: >=500 mg/dL — AB
Ketones, ur: NEGATIVE mg/dL
Leukocytes,Ua: NEGATIVE
Nitrite: NEGATIVE
Protein, ur: 30 mg/dL — AB
Specific Gravity, Urine: 1.008 (ref 1.005–1.030)
pH: 7 (ref 5.0–8.0)

## 2020-11-09 LAB — TROPONIN I (HIGH SENSITIVITY)
Troponin I (High Sensitivity): 53 ng/L — ABNORMAL HIGH (ref <18)
Troponin I (High Sensitivity): 65 ng/L — ABNORMAL HIGH (ref <18)

## 2020-11-09 LAB — CBC
HCT: 42.6 % (ref 36.0–46.0)
Hemoglobin: 15 g/dL (ref 12.0–15.0)
MCH: 30.6 pg (ref 26.0–34.0)
MCHC: 35.2 g/dL (ref 30.0–36.0)
MCV: 86.9 fL (ref 80.0–100.0)
Platelets: 221 10*3/uL (ref 150–400)
RBC: 4.9 MIL/uL (ref 3.87–5.11)
RDW: 12.5 % (ref 11.5–15.5)
WBC: 27.2 10*3/uL — ABNORMAL HIGH (ref 4.0–10.5)
nRBC: 0 % (ref 0.0–0.2)

## 2020-11-09 LAB — LACTIC ACID, PLASMA: Lactic Acid, Venous: 1.2 mmol/L (ref 0.5–1.9)

## 2020-11-09 LAB — PHOSPHORUS: Phosphorus: 2.3 mg/dL — ABNORMAL LOW (ref 2.5–4.6)

## 2020-11-09 LAB — LIPASE, BLOOD: Lipase: 21 U/L (ref 11–51)

## 2020-11-09 LAB — RESP PANEL BY RT-PCR (FLU A&B, COVID) ARPGX2
Influenza A by PCR: NEGATIVE
Influenza B by PCR: NEGATIVE
SARS Coronavirus 2 by RT PCR: NEGATIVE

## 2020-11-09 SURGERY — CORONARY/GRAFT ACUTE MI REVASCULARIZATION
Anesthesia: LOCAL

## 2020-11-09 MED ORDER — INSULIN ASPART 100 UNIT/ML ~~LOC~~ SOLN
0.0000 [IU] | Freq: Three times a day (TID) | SUBCUTANEOUS | Status: DC
  Administered 2020-11-10: 3 [IU] via SUBCUTANEOUS
  Administered 2020-11-10: 5 [IU] via SUBCUTANEOUS
  Administered 2020-11-10: 11 [IU] via SUBCUTANEOUS
  Administered 2020-11-11 – 2020-11-12 (×4): 3 [IU] via SUBCUTANEOUS
  Administered 2020-11-12: 2 [IU] via SUBCUTANEOUS
  Administered 2020-11-13: 3 [IU] via SUBCUTANEOUS
  Administered 2020-11-13 – 2020-11-14 (×3): 2 [IU] via SUBCUTANEOUS
  Administered 2020-11-14: 3 [IU] via SUBCUTANEOUS
  Administered 2020-11-15 (×2): 2 [IU] via SUBCUTANEOUS
  Administered 2020-11-15: 5 [IU] via SUBCUTANEOUS
  Administered 2020-11-16: 3 [IU] via SUBCUTANEOUS
  Administered 2020-11-16: 2 [IU] via SUBCUTANEOUS
  Administered 2020-11-16 – 2020-11-17 (×2): 3 [IU] via SUBCUTANEOUS

## 2020-11-09 MED ORDER — FENTANYL CITRATE (PF) 100 MCG/2ML IJ SOLN
INTRAMUSCULAR | Status: DC | PRN
  Administered 2020-11-09: 25 ug via INTRAVENOUS

## 2020-11-09 MED ORDER — BUPROPION HCL ER (SR) 150 MG PO TB12
150.0000 mg | ORAL_TABLET | Freq: Every day | ORAL | Status: DC
  Administered 2020-11-10 – 2020-11-16 (×7): 150 mg via ORAL
  Filled 2020-11-09 (×9): qty 1

## 2020-11-09 MED ORDER — INFLUENZA VAC A&B SA ADJ QUAD 0.5 ML IM PRSY
0.5000 mL | PREFILLED_SYRINGE | INTRAMUSCULAR | Status: DC
  Filled 2020-11-09: qty 0.5

## 2020-11-09 MED ORDER — SODIUM CHLORIDE 0.9 % IV SOLN: INTRAVENOUS | Status: DC

## 2020-11-09 MED ORDER — FAMOTIDINE IN NACL 20-0.9 MG/50ML-% IV SOLN
INTRAVENOUS | Status: AC
  Filled 2020-11-09: qty 50

## 2020-11-09 MED ORDER — LIDOCAINE HCL (PF) 1 % IJ SOLN
INTRAMUSCULAR | Status: AC
  Filled 2020-11-09: qty 30

## 2020-11-09 MED ORDER — SODIUM CHLORIDE 0.9% FLUSH
3.0000 mL | Freq: Two times a day (BID) | INTRAVENOUS | Status: DC
  Administered 2020-11-10 – 2020-11-17 (×4): 3 mL via INTRAVENOUS

## 2020-11-09 MED ORDER — INSULIN ASPART 100 UNIT/ML ~~LOC~~ SOLN: 0.0000 [IU] | Freq: Three times a day (TID) | SUBCUTANEOUS | Status: DC

## 2020-11-09 MED ORDER — SODIUM CHLORIDE 0.9 % IV BOLUS
INTRAVENOUS | Status: AC | PRN
  Administered 2020-11-09: 500 mL via INTRAVENOUS

## 2020-11-09 MED ORDER — CARVEDILOL 6.25 MG PO TABS: 6.2500 mg | ORAL_TABLET | Freq: Two times a day (BID) | ORAL | Status: DC

## 2020-11-09 MED ORDER — HEPARIN (PORCINE) IN NACL 1000-0.9 UT/500ML-% IV SOLN
INTRAVENOUS | Status: DC | PRN
  Administered 2020-11-09 (×2): 500 mL

## 2020-11-09 MED ORDER — VERAPAMIL HCL 2.5 MG/ML IV SOLN
INTRAVENOUS | Status: AC
  Filled 2020-11-09: qty 2

## 2020-11-09 MED ORDER — ONDANSETRON HCL 4 MG PO TABS: 4.0000 mg | ORAL_TABLET | Freq: Four times a day (QID) | ORAL | Status: DC

## 2020-11-09 MED ORDER — SODIUM CHLORIDE 0.9 % IV BOLUS
1000.0000 mL | Freq: Once | INTRAVENOUS | Status: AC
  Administered 2020-11-09: 1000 mL via INTRAVENOUS

## 2020-11-09 MED ORDER — HYDRALAZINE HCL 25 MG PO TABS
25.0000 mg | ORAL_TABLET | Freq: Every day | ORAL | Status: DC
  Administered 2020-11-09 – 2020-11-16 (×7): 25 mg via ORAL
  Filled 2020-11-09 (×7): qty 1

## 2020-11-09 MED ORDER — SODIUM CHLORIDE 0.9 % IV SOLN: 250.0000 mL | INTRAVENOUS | Status: DC | PRN

## 2020-11-09 MED ORDER — ASPIRIN 81 MG PO CHEW: 81.0000 mg | CHEWABLE_TABLET | Freq: Every day | ORAL | Status: DC

## 2020-11-09 MED ORDER — FENTANYL CITRATE (PF) 100 MCG/2ML IJ SOLN
INTRAMUSCULAR | Status: AC
  Filled 2020-11-09: qty 2

## 2020-11-09 MED ORDER — MIDAZOLAM HCL 2 MG/2ML IJ SOLN
INTRAMUSCULAR | Status: AC
  Filled 2020-11-09: qty 2

## 2020-11-09 MED ORDER — HEPARIN SODIUM (PORCINE) 5000 UNIT/ML IJ SOLN: 5000.0000 [IU] | Freq: Three times a day (TID) | INTRAMUSCULAR | Status: DC

## 2020-11-09 MED ORDER — POLYETHYLENE GLYCOL 3350 17 G PO PACK
17.0000 g | PACK | Freq: Two times a day (BID) | ORAL | Status: DC
  Administered 2020-11-12 – 2020-11-17 (×6): 17 g via ORAL
  Filled 2020-11-09 (×13): qty 1

## 2020-11-09 MED ORDER — ASPIRIN 81 MG PO CHEW
324.0000 mg | CHEWABLE_TABLET | Freq: Once | ORAL | Status: AC
  Administered 2020-11-09: 324 mg via ORAL
  Filled 2020-11-09: qty 4

## 2020-11-09 MED ORDER — ROSUVASTATIN CALCIUM 5 MG PO TABS: 5.0000 mg | ORAL_TABLET | Freq: Every day | ORAL | Status: DC

## 2020-11-09 MED ORDER — POTASSIUM CHLORIDE CRYS ER 20 MEQ PO TBCR
40.0000 meq | EXTENDED_RELEASE_TABLET | Freq: Once | ORAL | Status: AC
  Administered 2020-11-09: 40 meq via ORAL
  Filled 2020-11-09: qty 2

## 2020-11-09 MED ORDER — DIPHENHYDRAMINE HCL 50 MG/ML IJ SOLN
INTRAMUSCULAR | Status: AC
  Filled 2020-11-09: qty 1

## 2020-11-09 MED ORDER — METHYLPREDNISOLONE SODIUM SUCC 125 MG IJ SOLR
INTRAMUSCULAR | Status: AC
  Filled 2020-11-09: qty 2

## 2020-11-09 MED ORDER — ESCITALOPRAM OXALATE 20 MG PO TABS
20.0000 mg | ORAL_TABLET | Freq: Every day | ORAL | Status: DC
  Administered 2020-11-10 – 2020-11-16 (×7): 20 mg via ORAL
  Filled 2020-11-09 (×7): qty 1
  Filled 2020-11-09: qty 2

## 2020-11-09 MED ORDER — PNEUMOCOCCAL VAC POLYVALENT 25 MCG/0.5ML IJ INJ
0.5000 mL | INJECTION | INTRAMUSCULAR | Status: DC
  Filled 2020-11-09: qty 0.5

## 2020-11-09 MED ORDER — HEPARIN (PORCINE) IN NACL 1000-0.9 UT/500ML-% IV SOLN
INTRAVENOUS | Status: AC
  Filled 2020-11-09: qty 1000

## 2020-11-09 MED ORDER — NITROGLYCERIN IN D5W 200-5 MCG/ML-% IV SOLN
5.0000 ug/min | INTRAVENOUS | Status: DC
  Administered 2020-11-09: 5 ug/min via INTRAVENOUS
  Filled 2020-11-09: qty 250

## 2020-11-09 MED ORDER — MIDAZOLAM HCL 2 MG/2ML IJ SOLN
INTRAMUSCULAR | Status: DC | PRN
  Administered 2020-11-09: 1 mg via INTRAVENOUS

## 2020-11-09 MED ORDER — LIDOCAINE HCL (PF) 1 % IJ SOLN
INTRAMUSCULAR | Status: DC | PRN
  Administered 2020-11-09: 15 mL

## 2020-11-09 MED ORDER — ONDANSETRON HCL 4 MG/2ML IJ SOLN
4.0000 mg | Freq: Four times a day (QID) | INTRAMUSCULAR | Status: DC | PRN
  Administered 2020-11-10 – 2020-11-17 (×21): 4 mg via INTRAVENOUS
  Filled 2020-11-09 (×22): qty 2

## 2020-11-09 MED ORDER — SODIUM CHLORIDE 0.9% FLUSH
3.0000 mL | INTRAVENOUS | Status: DC | PRN
  Administered 2020-11-15: 3 mL via INTRAVENOUS

## 2020-11-09 MED ORDER — LORAZEPAM 1 MG PO TABS
1.0000 mg | ORAL_TABLET | Freq: Every day | ORAL | Status: DC | PRN
  Administered 2020-11-11 – 2020-11-16 (×2): 1 mg via ORAL
  Filled 2020-11-09 (×2): qty 1

## 2020-11-09 MED ORDER — INSULIN ASPART 100 UNIT/ML ~~LOC~~ SOLN
0.0000 [IU] | Freq: Every day | SUBCUTANEOUS | Status: DC
  Administered 2020-11-09: 2 [IU] via SUBCUTANEOUS

## 2020-11-09 MED ORDER — HEPARIN SODIUM (PORCINE) 5000 UNIT/ML IJ SOLN
5000.0000 [IU] | Freq: Three times a day (TID) | INTRAMUSCULAR | Status: DC
  Administered 2020-11-10 – 2020-11-17 (×22): 5000 [IU] via SUBCUTANEOUS
  Filled 2020-11-09 (×22): qty 1

## 2020-11-09 MED ORDER — ONDANSETRON HCL 4 MG/2ML IJ SOLN
4.0000 mg | Freq: Once | INTRAMUSCULAR | Status: AC
  Administered 2020-11-09: 4 mg via INTRAVENOUS
  Filled 2020-11-09: qty 2

## 2020-11-09 MED ORDER — METHYLPREDNISOLONE SODIUM SUCC 125 MG IJ SOLR
INTRAMUSCULAR | Status: DC | PRN
  Administered 2020-11-09: 125 mg via INTRAVENOUS

## 2020-11-09 MED ORDER — DIPHENHYDRAMINE HCL 50 MG/ML IJ SOLN
INTRAMUSCULAR | Status: DC | PRN
  Administered 2020-11-09: 25 mg via INTRAVENOUS

## 2020-11-09 MED ORDER — PANTOPRAZOLE SODIUM 40 MG PO TBEC
40.0000 mg | DELAYED_RELEASE_TABLET | Freq: Every day | ORAL | Status: DC
  Administered 2020-11-10 – 2020-11-16 (×7): 40 mg via ORAL
  Filled 2020-11-09 (×8): qty 1

## 2020-11-09 MED ORDER — TRAZODONE HCL 100 MG PO TABS
100.0000 mg | ORAL_TABLET | Freq: Every day | ORAL | Status: DC
  Administered 2020-11-09 – 2020-11-16 (×8): 100 mg via ORAL
  Filled 2020-11-09 (×2): qty 1
  Filled 2020-11-09: qty 2
  Filled 2020-11-09 (×5): qty 1

## 2020-11-09 MED ORDER — METOCLOPRAMIDE HCL 10 MG PO TABS
10.0000 mg | ORAL_TABLET | Freq: Three times a day (TID) | ORAL | Status: DC
  Administered 2020-11-10 – 2020-11-13 (×15): 10 mg via ORAL
  Filled 2020-11-09 (×17): qty 1

## 2020-11-09 MED ORDER — SODIUM CHLORIDE 0.9 % IV SOLN: INTRAVENOUS | Status: AC

## 2020-11-09 MED ORDER — ASPIRIN EC 81 MG PO TBEC
81.0000 mg | DELAYED_RELEASE_TABLET | Freq: Every day | ORAL | Status: DC
  Administered 2020-11-10 – 2020-11-16 (×7): 81 mg via ORAL
  Filled 2020-11-09 (×8): qty 1

## 2020-11-09 MED ORDER — HEPARIN SODIUM (PORCINE) 1000 UNIT/ML IJ SOLN
4000.0000 [IU] | Freq: Once | INTRAMUSCULAR | Status: AC
  Administered 2020-11-09: 4000 [IU] via INTRAVENOUS
  Filled 2020-11-09 (×2): qty 4

## 2020-11-09 MED ORDER — AMLODIPINE BESYLATE 10 MG PO TABS
10.0000 mg | ORAL_TABLET | Freq: Every day | ORAL | Status: DC
  Administered 2020-11-10 – 2020-11-16 (×7): 10 mg via ORAL
  Filled 2020-11-09 (×8): qty 1

## 2020-11-09 MED ORDER — IOHEXOL 350 MG/ML SOLN
INTRAVENOUS | Status: AC
  Filled 2020-11-09: qty 1

## 2020-11-09 MED ORDER — OXYBUTYNIN CHLORIDE ER 10 MG PO TB24
10.0000 mg | ORAL_TABLET | Freq: Every day | ORAL | Status: DC
  Administered 2020-11-10 – 2020-11-16 (×7): 10 mg via ORAL
  Filled 2020-11-09 (×8): qty 1

## 2020-11-09 MED ORDER — LABETALOL HCL 5 MG/ML IV SOLN: 10.0000 mg | INTRAVENOUS | Status: AC | PRN

## 2020-11-09 MED ORDER — ONDANSETRON HCL 4 MG/2ML IJ SOLN
INTRAMUSCULAR | Status: AC
  Filled 2020-11-09: qty 2

## 2020-11-09 MED ORDER — HEPARIN SODIUM (PORCINE) 1000 UNIT/ML IJ SOLN
INTRAMUSCULAR | Status: AC
  Filled 2020-11-09: qty 1

## 2020-11-09 MED ORDER — CEPHALEXIN 500 MG PO CAPS
500.0000 mg | ORAL_CAPSULE | Freq: Two times a day (BID) | ORAL | Status: DC
  Administered 2020-11-09 – 2020-11-16 (×15): 500 mg via ORAL
  Filled 2020-11-09 (×17): qty 1

## 2020-11-09 MED ORDER — ONDANSETRON HCL 4 MG/2ML IJ SOLN
INTRAMUSCULAR | Status: DC | PRN
  Administered 2020-11-09: 4 mg via INTRAVENOUS

## 2020-11-09 MED ORDER — LIP MEDEX EX OINT
TOPICAL_OINTMENT | CUTANEOUS | Status: DC | PRN
  Filled 2020-11-09: qty 7

## 2020-11-09 MED ORDER — HYDRALAZINE HCL 20 MG/ML IJ SOLN
10.0000 mg | INTRAMUSCULAR | Status: AC | PRN
  Filled 2020-11-09: qty 1

## 2020-11-09 SURGICAL SUPPLY — 13 items
CATH INFINITI JR4 5F (CATHETERS) ×2 IMPLANT
CATH VISTA GUIDE 6FR XBLAD3.5 (CATHETERS) ×2 IMPLANT
GLIDESHEATH SLEND SS 6F .021 (SHEATH) ×2 IMPLANT
GUIDEWIRE INQWIRE 1.5J.035X260 (WIRE) ×1 IMPLANT
INQWIRE 1.5J .035X260CM (WIRE) ×2
KIT ENCORE 26 ADVANTAGE (KITS) ×2 IMPLANT
KIT HEART LEFT (KITS) ×2 IMPLANT
KIT MICROPUNCTURE NIT STIFF (SHEATH) ×2 IMPLANT
PACK CARDIAC CATHETERIZATION (CUSTOM PROCEDURE TRAY) ×2 IMPLANT
SHEATH PINNACLE 6F 10CM (SHEATH) ×2 IMPLANT
SHEATH PROBE COVER 6X72 (BAG) ×2 IMPLANT
TRANSDUCER W/STOPCOCK (MISCELLANEOUS) ×2 IMPLANT
TUBING CIL FLEX 10 FLL-RA (TUBING) ×2 IMPLANT

## 2020-11-09 NOTE — ED Triage Notes (Signed)
Pt presents to ED for vomiting and diarrhea x 3 days. CBG 395.

## 2020-11-09 NOTE — Consult Note (Addendum)
[facility] Medical Consultation  [name] TAV:[id number] DOB: June 19, 1951 DOA: 11/09/2020 PCP: Celene Squibb, MD   Requesting physician: Dr. [name] Date of consultation: 11/09/2020 Reason for consultation: Management of medical issues  Impression/Recommendations Principal Problem:   Acute ST elevation myocardial infarction (STEMI) [facility]) Active Problems:   H/O cardiac pacemaker   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[place])   Type 2 diabetes mellitus with complication, with long-term current use of insulin (HCC)   CKD (chronic kidney disease) stage 3, GFR 30-59 ml/min (HCC) - per patient report   Essential hypertension   Nausea with vomiting   Hypercalcemia   STEMI (ST elevation myocardial infarction) ([place])   Hypokalemia   Chronic diastolic CHF (congestive heart failure) (HCC)   Hyperparathyroidism, primary ([place])   Chest pain of uncertain etiology  CAD Chest pain, ST elevation on EKG > Presented for nausea and vomiting as below but after going through CT scanner was noted to have ST elevations on her EKG and code STEMI was called and she was transported to Cath Lab, No obstructive CAD noted per sign out. - Per cardiology who is primary - Continuing Coreg and ASA  Hypertension - Per cardiology - Continuing amlodipine, Apresoline, Zestril - PRN Labetalol ordered  Chronic diastolic heart failure > EF 55-60%, G1DD in October 2021 > Repeating Echo being obtained - Per Cardiology  History of symptomatic bradycardia status post pacemaker - Per Cardiology  Intractable Nausea, vomiting > Extensive work-up in October found source likely due to bile reflux gastritis versus diabetic gastroparesis. Has quit THC since that time. > Has been unable to tolerate gastric emptying studies in the past per her report > Previous GI evaluation stated patient may not benefit from Reglan long-term unless she has symptoms without it and results with it > ?Is hypercalcemia  contributing - Supportive care - Reglan trial - Continue Zofran  Hypercalcemia Primary hyperparathyroidism > Calcium 12.2 in ED > This is a chronic issue (patient reports elevated calcium for least 15 years) which was addressed during previous admission patient states she prefer outpatient work-up, she will need to follow-up with general surgery.  Records reveal she has a known nodule per work-up in [place]. > With nausea and vomiting dehydration may be contributing to calcium elevation - IV fluids at 125 cc/h - Trend calcium level  Leukocytosis > WBC 28 in ED with left shift - Had similarly elevated white count during previous admission for nausea and vomiting, possibly reactive leukocytosis - No signs of infection on chest x-ray, urinalysis or CT abdomen - We will treat supportively for now - Trend fever curve and white count  CKD 3 Hypokalemia > Reports decreased p.o. intake.  Creatinine is stable at 1.76 > Potassium 3.1 - We will check mag and Phos - 40 mEq p.o. KCl - Trend electrolytes  Type 2 diabetes > Glucose 242 in ED > Tresiba 5 units daily at home - SSI with qhs coverage for now  Anxiety and depression - Continue home bupropion, escitalopram, trazodone, as needed lorazepam  We will followup again tomorrow. Please contact me [place] if we can be of assistance in the meanwhile. Thank you for this consultation.  Chief Complaint: Nausea, vomiting  HPI:  Melissa Lopez is a 69 year old female with past medical history of CAD, CHF, CKD 3, diabetes, hypertension, history of symptomatic bradycardia with pacemaker in place, primary hyperparathyroidism, PE, and recurrent nausea and vomiting who presented with several days of nausea vomiting and diarrhea.  Patient states her symptoms have  been ongoing for about a week and consist of nausea vomiting with decreased p.o. intake. She was seen in the ED on 11/20 with similar symptoms with left lower quadrant pain that was  intermittent.  She also had some dysuria and urinary frequency and was found to have findings consistent with a UTI at that time she was started on Keflex and discharged home after receiving IV fluids antiemetics and analgesics.  She reports this did not improve her symptoms and they have persisted since that time.  She has not taken any antiemetics at home per her report.  She states her symptoms had been improved including nausea vomiting and abdominal pain after her October admission which is described below.  She was admitted in October with similar symptoms with no clear etiology for her intractable nausea vomiting.  Top differentials were diabetic gastroparesis versus bile reflux gastritis versus ongoing THC use.  She has had multiple negative CTs including October admission, recent ED visit and ED visit today.  She had an EGD done during her admission in October which showed findings suggestive of bile reflux as above which was suggestive of the gastric dysmotility versus bile reflux gastritis as above.  She was discharged after she had resolution of her nausea vomiting and was tolerating diet.  Has a history of not tolerating gastric emptying studies per discharge summary.  ` She states she has not used any marijuana since her previous admission.  She denies fever, cough, shortness of breath, constipation, diarrhea.  Review of Systems:  As per HPI otherwise all other systems reviewed and are negative.  Past Medical History:  Diagnosis Date  . CKD (chronic kidney disease) stage 3, GFR 30-59 ml/min (HCC) 10/31/2018   - per patient report  . Coronary artery disease    per patient report - 2 caths with non-obstructive CAD  . Essential hypertension   . H/O cardiac pacemaker 10/31/2018  . Sick sinus syndrome ([place])   . Type 2 diabetes mellitus with complication, with long-term current use of insulin ([place]) 10/31/2018   with diabetic nephropathy   Past Surgical History:  Procedure Laterality Date   . BIOPSY  09/28/2020   Procedure: BIOPSY;  Surgeon: Ronald Lobo, MD;  Location: [facility];  Service: Endoscopy;;  . CARDIAC PACEMAKER PLACEMENT  2008   in [place] for sick sinus syndrome  . ESOPHAGOGASTRODUODENOSCOPY N/A 09/28/2020   Procedure: ESOPHAGOGASTRODUODENOSCOPY (EGD);  Surgeon: Ronald Lobo, MD;  Location: [facility];  Service: Endoscopy;  Laterality: N/A;  . ESOPHAGOGASTRODUODENOSCOPY (EGD) WITH PROPOFOL N/A 11/10/2018   Procedure: ESOPHAGOGASTRODUODENOSCOPY (EGD) WITH PROPOFOL;  Surgeon: Wilford Corner, MD;  Location: [facility];  Service: Endoscopy;  Laterality: N/A;  . LEFT HEART CATH AND CORONARY ANGIOGRAPHY N/A 10/31/2018   Procedure: LEFT HEART CATH AND CORONARY ANGIOGRAPHY;  Surgeon: Leonie Man, MD;  Location: [facility];  Service: Cardiovascular;  Laterality: N/A;  . PACEMAKER GENERATOR CHANGE  09/2018   in [place]   Social History:  reports that she quit smoking about 50 years ago. She quit after 3.00 years of use. She has never used smokeless tobacco. She reports previous alcohol use. She reports that she does not use drugs.  Allergies  Allergen Reactions  . Oxycodone-Acetaminophen Anaphylaxis  . Contrast Media [Iodinated Diagnostic Agents]     Contrast Dye - the one made out of shellfish, gives a really bad reaction - swelling  . No Known Allergies   . Oxycodone     Statused by Person:  EAST, CAMELIA(CE) on [phone]  . Xarelto  [Rivaroxaban] Swelling   Family History  Family history unknown: Yes    Prior to Admission medications   Medication Sig Start Date End Date Taking? Authorizing Provider  acetaminophen (TYLENOL) 650 MG CR tablet Take 1,300 mg by mouth every 8 (eight) hours as needed for pain.   Yes [provider]  amLODipine (NORVASC) 10 MG tablet TAKE 1 TABLET BY MOUTH EVERY DAY 10/10/20  Yes Imogene Burn, PA-C  aspirin 81 MG chewable tablet Chew 1 tablet (81 mg total) by mouth daily.  11/13/18  Yes Dessa Phi, DO  buPROPion [facility] SR) 150 MG 12 hr tablet Take 150 mg by mouth daily. 07/13/20  Yes [provider]  carvedilol (COREG) 6.25 MG tablet Take 1 tablet (6.25 mg total) by mouth 2 (two) times daily. 10/10/20  Yes Imogene Burn, PA-C  cephALEXin (KEFLEX) 250 MG capsule Take 500 mg by mouth 4 (four) times daily.   Yes [provider]  escitalopram (LEXAPRO) 20 MG tablet Take 20 mg by mouth daily. 08/11/20  Yes [provider]  gabapentin (NEURONTIN) 100 MG capsule Take 100 mg by mouth 3 (three) times daily. 08/11/20  Yes [provider]  hydrALAZINE (APRESOLINE) 25 MG tablet Take 25 mg by mouth at bedtime. 11/09/20  Yes [provider]  insulin degludec (TRESIBA FLEXTOUCH) 100 UNIT/ML FlexTouch Pen Inject 5 Units into the skin daily.   Yes [provider]  lisinopril (ZESTRIL) 20 MG tablet Take 1 tablet (20 mg total) by mouth daily. 10/10/20 01/08/21 Yes Imogene Burn, PA-C  LORazepam (ATIVAN) 1 MG tablet Take 1 mg by mouth daily as needed for anxiety. 05/10/20  Yes [provider]  Multiple Vitamin (MULTIVITAMIN WITH MINERALS) TABS tablet Take 1 tablet by mouth daily. 09/30/20  Yes Hongalgi, Lenis Dickinson, MD  Nutritional Supplements (,FEEDING SUPPLEMENT, PROSOURCE PLUS) liquid Take 30 mLs by mouth daily. 09/29/20  Yes Hongalgi, Lenis Dickinson, MD  omeprazole (PRILOSEC) 20 MG capsule Take 20 mg by mouth daily.   Yes [provider]  ondansetron (ZOFRAN) 4 MG tablet Take 1 tablet (4 mg total) by mouth every 6 (six) hours. 10/28/20  Yes Couture, Cortni S, PA-C  oxybutynin (DITROPAN-XL) 10 MG 24 hr tablet Take 10 mg by mouth daily.    Yes [provider]  polyethylene glycol (MIRALAX / GLYCOLAX) 17 g packet Take 17 g by mouth 2 (two) times daily. 09/29/20  Yes Hongalgi, Lenis Dickinson, MD  rosuvastatin (CRESTOR) 5 MG tablet Take one tablet daily 10/10/20  Yes Imogene Burn, PA-C  tiZANidine (ZANAFLEX) 2 MG tablet  Take 2 mg by mouth 3 (three) times daily.  10/30/20  Yes [provider]  traZODone (DESYREL) 100 MG tablet Take 100 mg by mouth at bedtime.   Yes [provider]  metoCLOPramide (REGLAN) 5 MG tablet Take by mouth. Patient not taking: Reported on 11/09/2020 10/11/20   [provider]   Physical Exam: Blood pressure (!) 168/108, pulse 78, temperature 99.2 F (37.3 C), temperature source Oral, resp. rate 15, height 5\' 4"  (1.626 m), weight 68.7 kg, SpO2 96 %. Vitals:   11/09/20 2130 11/09/20 2145  BP: (!) 145/101 (!) 168/108  Pulse: 74 78  Resp: 20 15  Temp:    SpO2: 96% 96%   Physical Exam Constitutional:      General: She is not in acute distress.    Appearance: Normal appearance. She is obese.  HENT:  Head: Normocephalic and atraumatic.     Mouth/Throat:     Mouth: Mucous membranes are moist.     Pharynx: Oropharynx is clear.  Eyes:     Extraocular Movements: Extraocular movements intact.     Pupils: Pupils are equal, round, and reactive to light.  Cardiovascular:     Rate and Rhythm: Normal rate and regular rhythm.     Pulses: Normal pulses.     Heart sounds: Normal heart sounds.  Pulmonary:     Effort: Pulmonary effort is normal. No respiratory distress.     Breath sounds: Normal breath sounds.  Abdominal:     General: Bowel sounds are normal. There is no distension.     Palpations: Abdomen is soft.     Tenderness: There is abdominal tenderness.  Musculoskeletal:        General: No swelling or deformity.  Skin:    General: Skin is warm and dry.  Neurological:     General: No focal deficit present.     Mental Status: Mental status is at baseline.    Labs on Admission:  Basic Metabolic Panel: Recent Labs  Lab 11/09/20 1250  NA 134*  K 3.1*  CL 99  CO2 22  GLUCOSE 243*  BUN 31*  CREATININE 1.76*  CALCIUM 12.2*   Liver Function Tests: Recent Labs  Lab 11/09/20 1250  AST 18  ALT 13  ALKPHOS 99  BILITOT 0.7  PROT 7.2   ALBUMIN 3.8   Recent Labs  Lab 11/09/20 1250  LIPASE 21   No results for input(s): AMMONIA in the last 168 hours. CBC: Recent Labs  Lab 11/09/20 1250  WBC 28.6*  NEUTROABS 24.5*  HGB 15.6*  HCT 44.0  MCV 85.9  PLT 235   Cardiac Enzymes: No results for input(s): CKTOTAL, CKMB, CKMBINDEX, TROPONINI in the last 168 hours. BNP: Invalid input(s): POCBNP CBG: No results for input(s): GLUCAP in the last 168 hours.  Radiological Exams on Admission: CT ABDOMEN PELVIS WO CONTRAST  Result Date: 11/09/2020 CLINICAL DATA:  Abdominal pain, nausea, vomiting and diarrhea for 3 days. EXAM: CT ABDOMEN AND PELVIS WITHOUT CONTRAST TECHNIQUE: Multidetector CT imaging of the abdomen and pelvis was performed following the standard protocol without IV contrast. COMPARISON:  10/28/2020 FINDINGS: Lower chest: Subpleural dependent atelectasis/edema mainly on the right side. No infiltrates or effusions. The heart is normal in size. No pericardial effusion. Moderate aortic calcifications. Hepatobiliary: No focal hepatic lesions are identified without contrast. The gallbladder is surgically absent. Stable mild intrahepatic and moderate extrahepatic biliary dilatation. The common bile duct measures approximately 16 mm in the porta hepatis and a maximum of 10 mm in the head of the pancreas. Pancreas: No mass, inflammation or ductal dilatation. Mild diffuse pancreatic atrophy. Spleen: Normal size.  No focal lesions. Adrenals/Urinary Tract: The adrenal glands are unremarkable. Stable numerous bilateral renal cyst several of which have rim like calcifications. There also speckled parenchymal calcifications throughout both kidneys. No obvious worrisome renal mass or hydroureteronephrosis. The bladder is unremarkable. Stomach/Bowel: The stomach, duodenum, small bowel and colon are grossly normal without oral contrast. No acute inflammatory changes, mass lesions or obstructive findings. Scattered colonic diverticulosis  without findings for acute diverticulitis. Vascular/Lymphatic: Stable moderate atherosclerotic calcifications involving the aorta and iliac arteries without focal aneurysm. No mesenteric or retroperitoneal mass or adenopathy. Reproductive: Surgically absent. Other: No pelvic mass or adenopathy. No free pelvic fluid collections. No inguinal mass or adenopathy. No abdominal wall hernia or subcutaneous lesions. Musculoskeletal: Stable degenerative changes involving the  spine and hips. No worrisome bone lesions. Moderate calcific tendinitis involving the hamstring tendons. There is also chondrocalcinosis noted at the hip joints and the pubic symphysis. IMPRESSION: 1. No acute abdominal/pelvic findings, mass lesions or adenopathy. 2. Stable numerous bilateral renal cysts. 3. Status post cholecystectomy with stable mild intrahepatic and moderate extrahepatic biliary dilatation. 4. Aortic atherosclerosis. Aortic Atherosclerosis (ICD10-I70.0). Electronically Signed   By: Marijo Sanes M.D.   On: 11/09/2020 16:40   CARDIAC CATHETERIZATION  Result Date: 11/09/2020  1st Mrg lesion is 100% stenosed.  Ost Ramus lesion is 65% stenosed.  Ost LAD to Prox LAD lesion is 15% stenosed with 60% stenosed side branch in Ost 1st Diag.  Ost Cx to Prox Cx lesion is 45% stenosed.  Diffuse mild CAD and very tortuous vessels. No true occlusive lesions. None  SUMMARY  Stable nonocclusive mild to moderate disease with no culprit lesion for ST elevation MI. ->  The true diagnosis is no longer STEMI -> very similar findings with that very similar presentation 2 years ago.  EF not assessed due to plan to conserve contrast.  Relatively low LVEDP-IV given 500 mL bolus. PLAN  Due to bed availability, she will be admitted to CT ICU, but could be transferred if necessary  We will consult [place] medicine service to assist with her severely elevated white blood cell count, already on antibiotics for possible UTI as well as profound nausea  vomiting and hyperglycemia. ->  Concern for possible HONK (hyperosmotic nonketotic ketoacidosis Glenetta Hew, MD   DG Chest Port 1 View  Result Date: 11/09/2020 CLINICAL DATA:  Pain. Additional history provided: Patient reports vomiting and diarrhea for 3 days. EXAM: PORTABLE CHEST 1 VIEW COMPARISON:  CT abdomen/pelvis performed earlier the same day 11/09/2020. Chest radiographs 09/25/2020. FINDINGS: Left chest dual lead implantable cardiac device. Cardiac monitoring leads overlie the chest. Unchanged cardiomegaly. Aortic atherosclerosis. No appreciable airspace consolidation or pulmonary edema. No evidence of pleural effusion or pneumothorax. No acute bony abnormality identified. IMPRESSION: No evidence of acute cardiopulmonary abnormality. Unchanged cardiomegaly. Aortic Atherosclerosis (ICD10-I70.0). Electronically Signed   By: Kellie Simmering DO   On: 11/09/2020 19:04    EKG: Independently reviewed.  Sinus rhythm, prolonged PR interval, ST elevations V3 V4 V5.  ST elevations possible in 3 and aVF.  Time spent: Approximately 55 minutes  [facility]  If 7PM-7AM, please contact night-coverage [email or web address] Password [facility] 11/09/2020, 9:56 PM

## 2020-11-09 NOTE — H&P (Signed)
° ° °  Patient with known history of pacemaker, diabetes, hypertension hyperlipidemia with previous evaluation for abnormal EKG, ST elevations but no occlusion back in November 2019 who presented any pain ER with nausea vomiting and poor p.o. intake over the last couple days.  Apparently has been treated for UTI.  Found to have elevated CBG.  EKG showed again possible ST elevation MI with ST elevations in V3 V4 V5 as well as I I, II I and aVF.  Although these EKGs look very similar to 2 years ago, the fact she is having intermittent ongoing chest pain, we decided it prudent to proceed with cardiac catheterization post PCI.  Full H&P to follow.   Glenetta Hew, MD

## 2020-11-09 NOTE — ED Provider Notes (Signed)
[facility] EMERGENCY DEPARTMENT Provider Note   CSN: [id number] Arrival date & time: 11/09/20  1118     History Chief Complaint  Patient presents with  . Emesis    Melissa Lopez is a 69 y.o. female.  Pt presents to the ED today with n/v/d.  Pt said sx have been going on for 3 days.  Pt was here on 11/20 for the same.  She was diagnosed with a UTI and was d/c home with abx.  Pt denies f/c.  She did have a CT scan on 11/20 which showed nothing acute.         Past Medical History:  Diagnosis Date  . CKD (chronic kidney disease) stage 3, GFR 30-59 ml/min (HCC) 10/31/2018   - per patient report  . Coronary artery disease    per patient report - 2 caths with non-obstructive CAD  . Essential hypertension   . H/O cardiac pacemaker 10/31/2018  . Sick sinus syndrome ([place])   . Type 2 diabetes mellitus with complication, with long-term current use of insulin ([place]) 10/31/2018   with diabetic nephropathy    Patient Active Problem List   Diagnosis Date Noted  . Chest pain of uncertain etiology 17/40/8144  . Hyperparathyroidism, primary ([place]) 09/25/2020  . Emesis   . Hypophosphatemia   . Hypokalemia 09/22/2020  . Chronic diastolic CHF (congestive heart failure) ([place]) 09/22/2020  . Noninfective gastroenteritis and colitis, unspecified 04/13/2020  . Unilateral primary osteoarthritis, right knee 04/13/2020  . Arthritis 04/13/2020  . Bradycardia 04/13/2020  . Cardiomyopathy, 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[place]) 04/13/2020  . Class 1 obesity due to excess calories with body mass index (BMI) of 34.0 to 34.9 in adult 04/13/2020  . Acute kidney failure, unspecified ([place]) 04/29/2019  . Nausea with vomiting 11/10/2018  . Malnutrition of moderate degree 11/09/2018  . AKI (acute kidney injury) ([place])   . PE (pulmonary thromboembolism) ([place])   . H/O cardiac pacemaker 10/31/2018  . Syncope 10/31/2018  . ST elevation on EKG without MI 10/31/2018  . Chest pain with high risk of acute coronary syndrome  10/31/2018  . 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[place]) 10/31/2018  . Type 2 diabetes mellitus with complication, with long-term current use of insulin ([place]) 10/31/2018  . CKD (chronic kidney disease) stage 3, GFR 30-59 ml/min (HCC) - per patient report 10/31/2018  . Presence of cardiac pacemaker 10/31/2018  . Essential hypertension   . Hypercalcemia 07/27/2015    Past Surgical History:  Procedure Laterality Date  . BIOPSY  09/28/2020   Procedure: BIOPSY;  Surgeon: Ronald Lobo, MD;  Location: [facility] ENDOSCOPY;  Service: Endoscopy;;  . CARDIAC PACEMAKER PLACEMENT  2008   in [place] for sick sinus syndrome  . CORONARY/GRAFT ACUTE MI REVASCULARIZATION N/A 11/09/2020   Procedure: CORONARY/GRAFT ACUTE MI REVASCULARIZATION;  Surgeon: Leonie Man, MD;  Location: [place] CV LAB;  Service: Cardiovascular;  Laterality: N/A;  . ESOPHAGOGASTRODUODENOSCOPY N/A 09/28/2020   Procedure: ESOPHAGOGASTRODUODENOSCOPY (EGD);  Surgeon: Ronald Lobo, MD;  Location: [facility];  Service: Endoscopy;  Laterality: N/A;  . ESOPHAGOGASTRODUODENOSCOPY (EGD) WITH PROPOFOL N/A 11/10/2018   Procedure: ESOPHAGOGASTRODUODENOSCOPY (EGD) WITH PROPOFOL;  Surgeon: Wilford Corner, MD;  Location: [facility];  Service: Endoscopy;  Laterality: N/A;  . LEFT HEART CATH AND CORONARY ANGIOGRAPHY N/A 10/31/2018   Procedure: LEFT HEART CATH AND CORONARY ANGIOGRAPHY;  Surgeon: Leonie Man, MD;  Location: [facility];  Service: Cardiovascular;  Laterality: N/A;  . PACEMAKER GENERATOR CHANGE  09/2018   in [place]  OB History   No obstetric history on file.     Family History  Family history unknown: Yes    Social History   Tobacco Use  . Smoking status: Former Smoker    Years: 3.00    Quit date: 12/09/1969    Years since quitting: 50.9  . Smokeless tobacco: Never Used  Substance Use Topics  . Alcohol use: Not Currently  . Drug use: Never    Home Medications Prior  to Admission medications   Medication Sig Start Date End Date Taking? Authorizing Provider  acetaminophen (TYLENOL) 650 MG CR tablet Take 1,300 mg by mouth every 8 (eight) hours as needed for pain.   Yes [provider]  amLODipine (NORVASC) 10 MG tablet TAKE 1 TABLET BY MOUTH EVERY DAY 10/10/20  Yes Imogene Burn, PA-C  aspirin 81 MG chewable tablet Chew 1 tablet (81 mg total) by mouth daily. 11/13/18  Yes Dessa Phi, DO  buPROPion [facility] SR) 150 MG 12 hr tablet Take 150 mg by mouth daily. 07/13/20  Yes [provider]  carvedilol (COREG) 6.25 MG tablet Take 1 tablet (6.25 mg total) by mouth 2 (two) times daily. 10/10/20  Yes Imogene Burn, PA-C  cephALEXin (KEFLEX) 250 MG capsule Take 500 mg by mouth 4 (four) times daily.   Yes [provider]  escitalopram (LEXAPRO) 20 MG tablet Take 20 mg by mouth daily. 08/11/20  Yes [provider]  gabapentin (NEURONTIN) 100 MG capsule Take 100 mg by mouth 3 (three) times daily. 08/11/20  Yes [provider]  hydrALAZINE (APRESOLINE) 25 MG tablet Take 25 mg by mouth at bedtime. 11/09/20  Yes [provider]  insulin degludec (TRESIBA FLEXTOUCH) 100 UNIT/ML FlexTouch Pen Inject 5 Units into the skin daily.   Yes [provider]  lisinopril (ZESTRIL) 20 MG tablet Take 1 tablet (20 mg total) by mouth daily. 10/10/20 01/08/21 Yes Imogene Burn, PA-C  LORazepam (ATIVAN) 1 MG tablet Take 1 mg by mouth daily as needed for anxiety. 05/10/20  Yes [provider]  Multiple Vitamin (MULTIVITAMIN WITH MINERALS) TABS tablet Take 1 tablet by mouth daily. 09/30/20  Yes Hongalgi, Lenis Dickinson, MD  Nutritional Supplements (,FEEDING SUPPLEMENT, PROSOURCE PLUS) liquid Take 30 mLs by mouth daily. 09/29/20  Yes Hongalgi, Lenis Dickinson, MD  omeprazole (PRILOSEC) 20 MG capsule Take 20 mg by mouth daily.   Yes [provider]  ondansetron (ZOFRAN) 4 MG tablet Take 1 tablet (4 mg total) by mouth every 6 (six)  hours. 10/28/20  Yes Couture, Cortni S, PA-C  oxybutynin (DITROPAN-XL) 10 MG 24 hr tablet Take 10 mg by mouth daily.    Yes [provider]  polyethylene glycol (MIRALAX / GLYCOLAX) 17 g packet Take 17 g by mouth 2 (two) times daily. 09/29/20  Yes Hongalgi, Lenis Dickinson, MD  rosuvastatin (CRESTOR) 5 MG tablet Take one tablet daily 10/10/20  Yes Imogene Burn, PA-C  tiZANidine (ZANAFLEX) 2 MG tablet Take 2 mg by mouth 3 (three) times daily.  10/30/20  Yes [provider]  traZODone (DESYREL) 100 MG tablet Take 100 mg by mouth at bedtime.   Yes [provider]  metoCLOPramide (REGLAN) 5 MG tablet Take by mouth. Patient not taking: Reported on 11/09/2020 10/11/20   [provider]    Allergies    Oxycodone-acetaminophen, Contrast media [iodinated diagnostic agents], No known allergies, Oxycodone, and Xarelto  [rivaroxaban]  Review of Systems   Review of Systems  Gastrointestinal: Positive for abdominal pain,  diarrhea, nausea and vomiting.  All other systems reviewed and are negative.   Physical Exam Updated Vital Signs BP (!) 161/130 (BP Location: Left Arm)   Pulse 78   Temp 99.1 F (37.3 C) (Oral)   Resp (!) 22   Ht 5\' 4"  (1.626 m)   Wt 68.7 kg   SpO2 99%   BMI 26.00 kg/m   Physical Exam Vitals and nursing note reviewed.  Constitutional:      Appearance: Normal appearance.  HENT:     Head: Normocephalic and atraumatic.     Right Ear: External ear normal.     Left Ear: External ear normal.     Nose: Nose normal.     Mouth/Throat:     Mouth: Mucous membranes are dry.  Eyes:     Extraocular Movements: Extraocular movements intact.     Conjunctiva/sclera: Conjunctivae normal.     Pupils: Pupils are equal, round, and reactive to light.  Cardiovascular:     Rate and Rhythm: Normal rate and regular rhythm.     Pulses: Normal pulses.     Heart sounds: Normal heart sounds.  Pulmonary:     Effort: Pulmonary effort is normal.     Breath sounds:  Normal breath sounds.  Abdominal:     General: Abdomen is flat. Bowel sounds are normal.     Palpations: Abdomen is soft.     Tenderness: There is generalized abdominal tenderness.  Musculoskeletal:        General: Normal range of motion.     Cervical back: Normal range of motion and neck supple.  Skin:    General: Skin is warm.     Capillary Refill: Capillary refill takes less than 2 seconds.  Neurological:     General: No focal deficit present.     Mental Status: She is alert and oriented to person, place, and time.  Psychiatric:        Mood and Affect: Mood normal.        Behavior: Behavior normal.     ED Results / Procedures / Treatments   Labs (all labs ordered are listed, but only abnormal results are displayed) Labs Reviewed  CBC WITH DIFFERENTIAL/PLATELET - Abnormal; Notable for the following components:      Result Value   WBC 28.6 (*)    RBC 5.12 (*)    Hemoglobin 15.6 (*)    Neutro Abs 24.5 (*)    Monocytes Absolute 1.6 (*)    Abs Immature Granulocytes 0.22 (*)    All other components within normal limits  COMPREHENSIVE METABOLIC PANEL - Abnormal; Notable for the following components:   Sodium 134 (*)    Potassium 3.1 (*)    Glucose, Bld 243 (*)    BUN 31 (*)    Creatinine, Ser 1.76 (*)    Calcium 12.2 (*)    GFR, Estimated 31 (*)    All other components within normal limits  URINALYSIS, ROUTINE W REFLEX MICROSCOPIC - Abnormal; Notable for the following components:   Color, Urine STRAW (*)    Glucose, UA >=500 (*)    Hgb urine dipstick SMALL (*)    Protein, ur 30 (*)    All other components within normal limits  BASIC METABOLIC PANEL - Abnormal; Notable for the following components:   Potassium 3.3 (*)    CO2 21 (*)    Glucose, Bld 197 (*)    Creatinine, Ser 1.58 (*)    Calcium 11.6 (*)    GFR, Estimated 35 (*)  All other components within normal limits  CBC - Abnormal; Notable for the following components:   WBC 27.2 (*)    All other components  within normal limits  COMPREHENSIVE METABOLIC PANEL - Abnormal; Notable for the following components:   Potassium 3.4 (*)    CO2 20 (*)    Glucose, Bld 228 (*)    BUN 24 (*)    Creatinine, Ser 1.51 (*)    Calcium 11.7 (*)    AST 14 (*)    GFR, Estimated 37 (*)    All other components within normal limits  CBC - Abnormal; Notable for the following components:   WBC 24.5 (*)    Hemoglobin 15.4 (*)    MCHC 36.1 (*)    All other components within normal limits  PHOSPHORUS - Abnormal; Notable for the following components:   Phosphorus 2.3 (*)    All other components within normal limits  GLUCOSE, CAPILLARY - Abnormal; Notable for the following components:   Glucose-Capillary 210 (*)    All other components within normal limits  GLUCOSE, CAPILLARY - Abnormal; Notable for the following components:   Glucose-Capillary 301 (*)    All other components within normal limits  GLUCOSE, CAPILLARY - Abnormal; Notable for the following components:   Glucose-Capillary 220 (*)    All other components within normal limits  TROPONIN I (HIGH SENSITIVITY) - Abnormal; Notable for the following components:   Troponin I (High Sensitivity) 53 (*)    All other components within normal limits  TROPONIN I (HIGH SENSITIVITY) - Abnormal; Notable for the following components:   Troponin I (High Sensitivity) 65 (*)    All other components within normal limits  TROPONIN I (HIGH SENSITIVITY) - Abnormal; Notable for the following components:   Troponin I (High Sensitivity) 57 (*)    All other components within normal limits  TROPONIN I (HIGH SENSITIVITY) - Abnormal; Notable for the following components:   Troponin I (High Sensitivity) 49 (*)    All other components within normal limits  CULTURE, BLOOD (ROUTINE X 2)  CULTURE, BLOOD (ROUTINE X 2)  RESP PANEL BY RT-PCR (FLU A&B, COVID) [id number]  URINE CULTURE  LIPASE, BLOOD  MAGNESIUM  LACTIC ACID, PLASMA    EKG EKG Interpretation  Date/Time:  Thursday  November 09 2020 18:02:08 EST Ventricular Rate:  92 PR Interval:    QRS Duration: 108 QT Interval:  378 QTC Calculation: 468 R Axis:   39 Text Interpretation: Sinus rhythm Prolonged PR interval Probable anterolateral infarct, acute ST elevation, consider inferior injury >>> Acute MI <<< since last tracing no significant change Confirmed by Daleen Bo [phone]) on 11/09/2020 7:17:37 PM   Radiology CT ABDOMEN PELVIS WO CONTRAST  Result Date: 11/09/2020 CLINICAL DATA:  Abdominal pain, nausea, vomiting and diarrhea for 3 days. EXAM: CT ABDOMEN AND PELVIS WITHOUT CONTRAST TECHNIQUE: Multidetector CT imaging of the abdomen and pelvis was performed following the standard protocol without IV contrast. COMPARISON:  10/28/2020 FINDINGS: Lower chest: Subpleural dependent atelectasis/edema mainly on the right side. No infiltrates or effusions. The heart is normal in size. No pericardial effusion. Moderate aortic calcifications. Hepatobiliary: No focal hepatic lesions are identified without contrast. The gallbladder is surgically absent. Stable mild intrahepatic and moderate extrahepatic biliary dilatation. The common bile duct measures approximately 16 mm in the porta hepatis and a maximum of 10 mm in the head of the pancreas. Pancreas: No mass, inflammation or ductal dilatation. Mild diffuse pancreatic atrophy. Spleen: Normal size.  No focal lesions. Adrenals/Urinary Tract: The  adrenal glands are unremarkable. Stable numerous bilateral renal cyst several of which have rim like calcifications. There also speckled parenchymal calcifications throughout both kidneys. No obvious worrisome renal mass or hydroureteronephrosis. The bladder is unremarkable. Stomach/Bowel: The stomach, duodenum, small bowel and colon are grossly normal without oral contrast. No acute inflammatory changes, mass lesions or obstructive findings. Scattered colonic diverticulosis without findings for acute diverticulitis. Vascular/Lymphatic:  Stable moderate atherosclerotic calcifications involving the aorta and iliac arteries without focal aneurysm. No mesenteric or retroperitoneal mass or adenopathy. Reproductive: Surgically absent. Other: No pelvic mass or adenopathy. No free pelvic fluid collections. No inguinal mass or adenopathy. No abdominal wall hernia or subcutaneous lesions. Musculoskeletal: Stable degenerative changes involving the spine and hips. No worrisome bone lesions. Moderate calcific tendinitis involving the hamstring tendons. There is also chondrocalcinosis noted at the hip joints and the pubic symphysis. IMPRESSION: 1. No acute abdominal/pelvic findings, mass lesions or adenopathy. 2. Stable numerous bilateral renal cysts. 3. Status post cholecystectomy with stable mild intrahepatic and moderate extrahepatic biliary dilatation. 4. Aortic atherosclerosis. Aortic Atherosclerosis (ICD10-I70.0). Electronically Signed   By: Marijo Sanes M.D.   On: 11/09/2020 16:40   CARDIAC CATHETERIZATION  Result Date: 11/09/2020  1st Mrg lesion is 100% stenosed.  Ost Ramus lesion is 65% stenosed.  Ost LAD to Prox LAD lesion is 15% stenosed with 60% stenosed side branch in Ost 1st Diag.  Ost Cx to Prox Cx lesion is 45% stenosed.  Diffuse mild CAD and very tortuous vessels. No true occlusive lesions. None  SUMMARY  Stable nonocclusive mild to moderate disease with no culprit lesion for ST elevation MI. ->  The true diagnosis is no longer STEMI -> very similar findings with that very similar presentation 2 years ago.  EF not assessed due to plan to conserve contrast.  Relatively low LVEDP-IV given 500 mL bolus. PLAN  Due to bed availability, she will be admitted to CT ICU, but could be transferred if necessary  We will consult [place] medicine service to assist with her severely elevated white blood cell count, already on antibiotics for possible UTI as well as profound nausea vomiting and hyperglycemia. ->  Concern for possible HONK  (hyperosmotic nonketotic ketoacidosis Glenetta Hew, MD   DG Chest Port 1 View  Result Date: 11/09/2020 CLINICAL DATA:  Pain. Additional history provided: Patient reports vomiting and diarrhea for 3 days. EXAM: PORTABLE CHEST 1 VIEW COMPARISON:  CT abdomen/pelvis performed earlier the same day 11/09/2020. Chest radiographs 09/25/2020. FINDINGS: Left chest dual lead implantable cardiac device. Cardiac monitoring leads overlie the chest. Unchanged cardiomegaly. Aortic atherosclerosis. No appreciable airspace consolidation or pulmonary edema. No evidence of pleural effusion or pneumothorax. No acute bony abnormality identified. IMPRESSION: No evidence of acute cardiopulmonary abnormality. Unchanged cardiomegaly. Aortic Atherosclerosis (ICD10-I70.0). Electronically Signed   By: Kellie Simmering DO   On: 11/09/2020 19:04    Procedures Procedures (including critical care time)  Medications Ordered in ED Medications  amLODipine (NORVASC) tablet 10 mg (10 mg Oral Given 11/10/20 0935)  buPROPion (WELLBUTRIN SR) 12 hr tablet 150 mg (150 mg Oral Given 11/10/20 0934)  cephALEXin (KEFLEX) capsule 500 mg (500 mg Oral Given 11/10/20 0934)  escitalopram (LEXAPRO) tablet 20 mg (20 mg Oral Given 11/10/20 0934)  hydrALAZINE (APRESOLINE) tablet 25 mg (25 mg Oral Given 11/09/20 2218)  LORazepam (ATIVAN) tablet 1 mg (has no administration in time range)  pantoprazole (PROTONIX) EC tablet 40 mg (40 mg Oral Given 11/10/20 0935)  oxybutynin (DITROPAN-XL) 24 hr tablet 10 mg (10 mg Oral  Given 11/10/20 0934)  polyethylene glycol (MIRALAX / GLYCOLAX) packet 17 g (17 g Oral Not Given 11/10/20 0936)  traZODone (DESYREL) tablet 100 mg (100 mg Oral Given 11/09/20 2222)  labetalol (NORMODYNE) injection 10 mg (has no administration in time range)  hydrALAZINE (APRESOLINE) injection 10 mg (has no administration in time range)  ondansetron (ZOFRAN) injection 4 mg (4 mg Intravenous Given 11/10/20 0851)  heparin injection 5,000 Units (5,000  Units Subcutaneous Given 11/10/20 0643)  0.9 %  sodium chloride infusion (0 mLs Intravenous Stopped 11/10/20 0839)  sodium chloride flush (NS) 0.9 % injection 3 mL (3 mLs Intravenous Given 11/10/20 0937)  sodium chloride flush (NS) 0.9 % injection 3 mL (has no administration in time range)  0.9 %  sodium chloride infusion (has no administration in time range)  pneumococcal 23 valent vaccine (PNEUMOVAX-23) injection 0.5 mL (has no administration in time range)  influenza vaccine adjuvanted (FLUAD) injection 0.5 mL (has no administration in time range)  lip balm (CARMEX) ointment ( Topical Given 11/09/20 2313)  aspirin EC tablet 81 mg (81 mg Oral Given 11/10/20 0935)  0.9 %  sodium chloride infusion ( Intravenous Rate/Dose Verify 11/10/20 0200)  insulin aspart (novoLOG) injection 0-15 Units (5 Units Subcutaneous Given 11/10/20 1155)  insulin aspart (novoLOG) injection 0-5 Units (2 Units Subcutaneous Given 11/09/20 2319)  metoCLOPramide (REGLAN) tablet 10 mg (10 mg Oral Given 11/10/20 1155)  Chlorhexidine Gluconate Cloth 2 % PADS 6 each (6 each Topical Given 11/10/20 0935)  carvedilol (COREG) tablet 12.5 mg (12.5 mg Oral Given 11/10/20 0938)  rosuvastatin (CRESTOR) tablet 20 mg (20 mg Oral Given 11/10/20 0934)  insulin glargine (LANTUS) injection 5 Units (5 Units Subcutaneous Given 11/10/20 1154)  perflutren lipid microspheres (DEFINITY) IV suspension (2 mLs Intravenous Given 11/10/20 1147)  ondansetron (ZOFRAN) injection 4 mg (4 mg Intravenous Given 11/09/20 1200)  sodium chloride 0.9 % bolus 1,000 mL (0 mLs Intravenous Stopping Infusion hung by another clincian 11/09/20 2045)  heparin sodium (porcine) injection 4,000 Units (4,000 Units Intravenous Given 11/09/20 1856)  aspirin chewable tablet 324 mg (324 mg Oral Given 11/09/20 1842)  sodium chloride 0.9 % bolus (0 mLs  Stopped 11/09/20 2045)  potassium chloride SA (KLOR-CON) CR tablet 40 mEq (40 mEq Oral Given 11/09/20 2310)  potassium chloride SA (KLOR-CON) CR  tablet 40 mEq (40 mEq Oral Given 11/10/20 1154)  phosphorus (K PHOS NEUTRAL) tablet 500 mg (500 mg Oral Given 11/10/20 1155)    ED Course  I have reviewed the triage vital signs and the nursing notes.  Pertinent labs & imaging results that were available during my care of the patient were reviewed by me and considered in my medical decision making (see chart for details).    MDM Rules/Calculators/A&P                          Pt given IVFs and zofran.  She still has abdominal pain with an elevated wbc.  CT abd/pelvis ordered.  Pt signed out to Dr. Eulis Foster at shift change.   Final Clinical Impression(s) / ED Diagnoses Final diagnoses:  ST elevation myocardial infarction (STEMI), unspecified artery [facility])    Rx / DC Orders ED Discharge Orders    None       Isla Pence, MD 11/10/20 1340

## 2020-11-09 NOTE — Progress Notes (Signed)
Daughter took patients clothes home. Glasses remain at bedside. Daughter states the patient should have a blue bag with a cell phone in it. No blue bag brought from Cath lab. Cath lab checked, no bag located. Emergency room to be called by Charge RN. Patients family will call Forestine Na as the patient was transported from [facility] to [facility] and it is uncertain if the patient had the bag in this facility.

## 2020-11-09 NOTE — ED Provider Notes (Signed)
3:53 PM-checkout from Dr. Gilford Raid to evaluate patient after CT imaging to determine source of abdominal pain which is apparently the presenting complaint.  Patient has elevated white count, she is not currently taking steroids.  She has been hypertensive here.  5:20 PM-CT scan is returned.  It was done without contrast.  It does not show any acute pathology.  6:15 PM-as I emerged from another room, attending to her critically ill child, the ED technician showed me 2 EKGs on this patient which appeared to be an acute STEMI, anterolaterally, possibly inferior.  6:22 PM-code STEMI has been paged out, and Dr. [name], agreed to take patient to the Cath lab.  6:40 PM-she continues to have chest pain.  She remains alert and cooperative.  Medications ordered to treat acute MI/STEMI.  Awaiting EMS transport to [facility] emergency department/Cath Lab.  Medications  nitroGLYCERIN 50 mg in dextrose 5 % 250 mL (0.2 mg/mL) infusion (has no administration in time range)  heparin sodium (porcine) injection 4,000 Units (has no administration in time range)  aspirin chewable tablet 324 mg (has no administration in time range)  ondansetron (ZOFRAN) injection 4 mg (4 mg Intravenous Given 11/09/20 1200)  sodium chloride 0.9 % bolus 1,000 mL (1,000 mLs Intravenous New Bag/Given 11/09/20 1200)    EKG Interpretation  Date/Time:  Thursday November 09 2020 17:57:54 EST Ventricular Rate:  96 PR Interval:    QRS Duration: 103 QT Interval:  350 QTC Calculation: 443 R Axis:   38 Text Interpretation: Sinus rhythm Prolonged PR interval Probable anterolateral infarct, acute ST elevation, consider inferior injury >>> Acute MI <<< Since last tracing now STEMI present Confirmed by Daleen Bo [phone]) on 11/09/2020 6:27:58 PM        .Critical Care Performed by: Daleen Bo, MD Authorized by: Daleen Bo, MD   Critical care provider statement:    Critical care time (minutes):  35   Critical care start  time:  11/09/2020 4:30 PM   Critical care end time:  11/09/2020 6:41 PM   Critical care time was exclusive of:  Separately billable procedures and treating other patients   Critical care was necessary to treat or prevent imminent or life-threatening deterioration of the following conditions:  Cardiac failure   Critical care was time spent personally by me on the following activities:  Blood draw for specimens, development of treatment plan with patient or surrogate, discussions with consultants, evaluation of patient's response to treatment, examination of patient, obtaining history from patient or surrogate, ordering and performing treatments and interventions, ordering and review of laboratory studies, pulse oximetry, re-evaluation of patient's condition, review of old charts and ordering and review of radiographic studies        Daleen Bo, MD 11/10/20 [phone]

## 2020-11-09 NOTE — H&P (Addendum)
Cardiology Admission History and Physical:   Patient ID: Melissa Lopez MRN: [id number]; DOB: [birth year]   Admission date: 11/09/2020  Primary Care Provider: Celene Squibb, MD [facility] Cardiologist: Carlyle Dolly, MD (was Dr. Rickard Rhymes)  [place] Electrophysiologist:  Thompson Grayer, MD /Dr. Lovena Le  Chief Complaint:  Chest pain, vomiting and diarrhea   Patient Profile:   Melissa Lopez is a 69 y.o. 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  recent complaints in Oct with DOE echo was done with normal EF and g1DD.  09/29/20 wsa hospitalized for intractable N,V,abd pain.  Possibly due to diabetic gastroparesis and dysmotility or bile relfux gastritis.  She also had hypercalcemia hypokalemia and hypophosphatemia.  Patient did have primary hyperparathyroidism.   History of Present Illness:   Melissa Lopez with above hx presented to ER today with N/V/D for 3 days.  She had been seen in ER 10/28/20 with same symptoms.  CT of abd with no acute issues in Nov   With today's visit repeat CT without acute pathology done without contrast.  But after CT her EKG was read as STEMI. anterolaterally possibly inf.  CODE STEMI was paged.  She was having  Chest pain.  She was transported emergently to [facility] cath lab.   Labs at [facility] K+ 3.1 glucose 243 Cr 1.76 WBC 28.6 Hgb 15.6  Troponin 53  COVID panel neg  U/a nitrite neg + glucose- had been treated for UTI  Blood cultures pending   PCXR IMPRESSION: No evidence of acute cardiopulmonary abnormality. Unchanged cardiomegaly. Aortic Atherosclerosis (ICD10-I70.0   EKG showed again possible ST elevation MI with ST elevations in V3 V4 V5 as well as I I, II I and aVF.  Although these EKGs look very similar  to 2 years ago, the fact she is having intermittent ongoing chest pain, we decided it prudent to proceed with cardiac catheterization    Past Medical History:  Diagnosis Date  . CKD (chronic kidney disease) stage 3, GFR 30-59 ml/min (HCC) 10/31/2018   - per patient report  . Coronary artery disease    per patient report - 2 caths with non-obstructive CAD  . Essential hypertension   . H/O cardiac pacemaker 10/31/2018  . Sick sinus syndrome ([place])   . Type 2 diabetes mellitus with complication, with long-term current use of insulin ([place]) 10/31/2018   with diabetic nephropathy    Past Surgical History:  Procedure Laterality Date  . BIOPSY  09/28/2020   Procedure: BIOPSY;  Surgeon: Ronald Lobo, MD;  Location: [facility];  Service: Endoscopy;;  . CARDIAC PACEMAKER PLACEMENT  2008   in [place] for sick [place] syndrome  . ESOPHAGOGASTRODUODENOSCOPY N/A 09/28/2020   Procedure: ESOPHAGOGASTRODUODENOSCOPY (EGD);  Surgeon: Ronald Lobo, MD;  Location: [facility];  Service: Endoscopy;  Laterality: N/A;  . ESOPHAGOGASTRODUODENOSCOPY (EGD) WITH PROPOFOL N/A 11/10/2018   Procedure: ESOPHAGOGASTRODUODENOSCOPY (EGD) WITH PROPOFOL;  Surgeon: Wilford Corner, MD;  Location: [facility];  Service: Endoscopy;  Laterality: N/A;  . LEFT HEART CATH AND CORONARY ANGIOGRAPHY N/A 10/31/2018   Procedure: LEFT HEART CATH AND CORONARY ANGIOGRAPHY;  Surgeon: Leonie Man, MD;  Location: [facility] CV [facility];  Service: Cardiovascular;  Laterality: N/A;  . PACEMAKER GENERATOR CHANGE  09/2018   in [place]     Medications  Prior to Admission: Prior to Admission medications   Medication Sig Start Date End Date Taking? Authorizing Provider  acetaminophen (TYLENOL) 650 MG CR tablet Take 1,300 mg by mouth every 8 (eight) hours as needed for pain.   Yes [provider]  amLODipine (NORVASC) 10 MG tablet TAKE 1 TABLET BY MOUTH EVERY DAY 10/10/20  Yes Imogene Burn, PA-C  aspirin  81 MG chewable tablet Chew 1 tablet (81 mg total) by mouth daily. 11/13/18  Yes Dessa Phi, DO  buPROPion [facility] SR) 150 MG 12 hr tablet Take 150 mg by mouth daily. 07/13/20  Yes [provider]  carvedilol (COREG) 6.25 MG tablet Take 1 tablet (6.25 mg total) by mouth 2 (two) times daily. 10/10/20  Yes Imogene Burn, PA-C  cephALEXin (KEFLEX) 250 MG capsule Take 500 mg by mouth 4 (four) times daily.   Yes [provider]  escitalopram (LEXAPRO) 20 MG tablet Take 20 mg by mouth daily. 08/11/20  Yes [provider]  gabapentin (NEURONTIN) 100 MG capsule Take 100 mg by mouth 3 (three) times daily. 08/11/20  Yes [provider]  hydrALAZINE (APRESOLINE) 25 MG tablet Take 25 mg by mouth at bedtime. 11/09/20  Yes [provider]  insulin degludec (TRESIBA FLEXTOUCH) 100 UNIT/ML FlexTouch Pen Inject 5 Units into the skin daily.   Yes [provider]  lisinopril (ZESTRIL) 20 MG tablet Take 1 tablet (20 mg total) by mouth daily. 10/10/20 01/08/21 Yes Imogene Burn, PA-C  LORazepam (ATIVAN) 1 MG tablet Take 1 mg by mouth daily as needed for anxiety. 05/10/20  Yes [provider]  Multiple Vitamin (MULTIVITAMIN WITH MINERALS) TABS tablet Take 1 tablet by mouth daily. 09/30/20  Yes Hongalgi, Lenis Dickinson, MD  Nutritional Supplements (,FEEDING SUPPLEMENT, PROSOURCE PLUS) liquid Take 30 mLs by mouth daily. 09/29/20  Yes Hongalgi, Lenis Dickinson, MD  omeprazole (PRILOSEC) 20 MG capsule Take 20 mg by mouth daily.   Yes [provider]  ondansetron (ZOFRAN) 4 MG tablet Take 1 tablet (4 mg total) by mouth every 6 (six) hours. 10/28/20  Yes Couture, Cortni S, PA-C  oxybutynin (DITROPAN-XL) 10 MG 24 hr tablet Take 10 mg by mouth daily.    Yes [provider]  polyethylene glycol (MIRALAX / GLYCOLAX) 17 g packet Take 17 g by mouth 2 (two) times daily. 09/29/20  Yes Hongalgi, Lenis Dickinson, MD  rosuvastatin (CRESTOR) 5 MG tablet Take one tablet daily 10/10/20   Yes Imogene Burn, PA-C  tiZANidine (ZANAFLEX) 2 MG tablet Take 2 mg by mouth 3 (three) times daily.  10/30/20  Yes [provider]  traZODone (DESYREL) 100 MG tablet Take 100 mg by mouth at bedtime.   Yes [provider]  metoCLOPramide (REGLAN) 5 MG tablet Take by mouth. Patient not taking: Reported on 11/09/2020 10/11/20   [provider]     Allergies:    Allergies  Allergen Reactions  . Oxycodone-Acetaminophen Anaphylaxis  . Contrast Media [Iodinated Diagnostic Agents]     Contrast Dye - the one made out of shellfish, gives a really bad reaction - swelling  . No Known Allergies   . Oxycodone     Statused by Person:  EAST, CAMELIA(CE) on [id number]  . Xarelto  [Rivaroxaban] Swelling    Social History:   Social History   Socioeconomic History  . Marital status: Single    Spouse name: Not on file  . Number of children: 4  . Years of education: Not on file  .  Highest education level: Not on file  Occupational History  . Not on file  Tobacco Use  . Smoking status: Former Smoker    Years: 3.00    Quit date: 12/09/1969    Years since quitting: 50.9  . Smokeless tobacco: Never Used  Substance and Sexual Activity  . Alcohol use: Not Currently  . Drug use: Never  . Sexual activity: Not on file  Other Topics Concern  . Not on file  Social History Narrative   Moved from [place] to be with daughter in [place].   Now in [employer]   Social Determinants of Health   Financial Resource Strain:   . Difficulty of Paying Living Expenses: Not on file  Food Insecurity:   . Worried About Charity fundraiser in the Last Year: Not on file  . Ran Out of Food in the Last Year: Not on file  Transportation Needs:   . Lack of Transportation (Medical): Not on file  . Lack of Transportation (Non-Medical): Not on file  Physical Activity:   . Days of Exercise per Week: Not on file  . Minutes of Exercise per Session: Not on file  Stress:   . Feeling of Stress : Not on  file  Social Connections:   . Frequency of Communication with Friends and Family: Not on file  . Frequency of Social Gatherings with Friends and Family: Not on file  . Attends Religious Services: Not on file  . Active Member of Clubs or Organizations: Not on file  . Attends Archivist Meetings: Not on file  . Marital Status: Not on file  Intimate Partner Violence:   . Fear of Current or Ex-Partner: Not on file  . Emotionally Abused: Not on file  . Physically Abused: Not on file  . Sexually Abused: Not on file    Family History:  The patient's Family history is unknown by patient.    ROS:  Please see the history of present illness.  General:no colds some fevers, no weight changes Skin:no rashes or ulcers HEENT:no blurred vision, no congestion CV:see HPI PUL:see HPI GI:+ diarrhea no constipation or melena, +indigestion, + N & V + abd pain GU:no hematuria, no dysuria- recent UTI treated MS:no joint pain, no claudication Neuro:no syncope, no lightheadedness Endo:+ diabetes, no thyroid disease  All other ROS reviewed and negative.     Physical Exam/Data:   Vitals:   11/09/20 2031 11/09/20 2035 11/09/20 2040 11/09/20 2045  BP: (!) 160/73 (!) 148/102    Pulse: 71 83 (!) 0 (!) 0  Resp: (!) 0 18 (!) 0 (!) 0  Temp:      TempSrc:      SpO2: 100% 92% (!) 0% (!) 0%  Weight:      Height:       No intake or output data in the 24 hours ending 11/09/20 2108 Last 3 Weights 11/09/2020 10/28/2020 10/10/2020  Weight (lbs) 163 lb 2.3 oz 163 lb 2.3 oz 164 lb  Weight (kg) 74 kg 74 kg 74.39 kg     Body mass index is 28 kg/m.  General:  Well nourished, well developed, in mild distress HEENT: normal Lymph: no adenopathy Neck: no JVD Endocrine:  No thryomegaly Vascular: No carotid bruits; pedal pulses 2+ bilaterally   Cardiac:  normal S1, S2; RRR; no murmur gallup rub or click Lungs:  clear to auscultation bilaterally, no wheezing, rhonchi or rales  Abd: soft, +tender diffuse,  no hepatomegaly  Ext: no lower ext edema Musculoskeletal:  No deformities, BUE and BLE strength normal and equal Skin: warm and dry  Neuro:  Alert and oriented X 3 MAE follows commands , no focal abnormalities noted Psych:  Normal affect     Relevant CV Studies: Echo 09/14/20 IMPRESSIONS    1. The endocardium in the apex is poorly visualized, cannot exclude  possible apical hypokinesis and/or apical hypertrophy, consider contrast  study. . Left ventricular ejection fraction, by estimation, is 55 to 60%.  The left ventricle has normal  function. Left ventricular endocardial border not optimally defined to  evaluate regional wall motion. There is moderate left ventricular  hypertrophy. Left ventricular diastolic parameters are consistent with  Grade I diastolic dysfunction (impaired  relaxation).  2. Right ventricular systolic function is normal. The right ventricular  size is normal.  3. The mitral valve is normal in structure. No evidence of mitral valve  regurgitation. No evidence of mitral stenosis.  4. The aortic valve is tricuspid. Aortic valve regurgitation is not  visualized. No aortic stenosis is present.  5. The inferior vena cava is normal in size with greater than 50%  respiratory variability, suggesting right atrial pressure of 3 mmHg.   Comparison(s): Echocardiogram done 11/01/18 showed and EF of 55-60%.   FINDINGS  Left Ventricle: The endocardium in the apex is poorly visualized, cannot  exclude possible apical hypokinesis and/or apical hypertrophy, consider  contrast study. Left ventricular ejection fraction, by estimation, is 55  to 60%. The left ventricle has  normal function. Left ventricular endocardial border not optimally defined  to evaluate regional wall motion. The left ventricular internal cavity  size was normal in size. There is moderate left ventricular hypertrophy.  Left ventricular diastolic  parameters are consistent with Grade I diastolic  dysfunction (impaired  relaxation). Normal left ventricular filling pressure.   Right Ventricle: The right ventricular size is normal. No increase in  right ventricular wall thickness. Right ventricular systolic function is  normal.   Left Atrium: Left atrial size was normal in size.   Right Atrium: Right atrial size was normal in size.   Pericardium: There is no evidence of pericardial effusion.   Mitral Valve: The mitral valve is normal in structure. No evidence of  mitral valve regurgitation. No evidence of mitral valve stenosis.   Tricuspid Valve: The tricuspid valve is normal in structure. Tricuspid  valve regurgitation is mild . No evidence of tricuspid stenosis.   Aortic Valve: The aortic valve is tricuspid. Aortic valve regurgitation is  not visualized. No aortic stenosis is present. Aortic valve mean gradient  measures 2.8 mmHg. Aortic valve peak gradient measures 10.1 mmHg. Aortic  valve area, by VTI measures 2.88  cm.   Pulmonic Valve: The pulmonic valve was not well visualized. Pulmonic valve  regurgitation is not visualized. No evidence of pulmonic stenosis.   Aorta: The aortic root is normal in size and structure.   Pulmonary Artery: Indeterminant PASP, inadequate TR jet.   Venous: The inferior vena cava is normal in size with greater than 50%  respiratory variability, suggesting right atrial pressure of 3 mmHg.   IAS/Shunts: No atrial level shunt detected by color flow Doppler.   Additional Comments: A pacer wire is visualized.     LEFT VENTRICLE  PLAX 2D  LVIDd:     4.51 cm   Diastology  LVIDs:     2.96 cm   LV e' medial:  4.64 cm/s  LV PW:     1.28 cm   LV E/e' medial: 12.0  LV IVS:    1.32 cm   LV e' lateral:  5.13 cm/s  LVOT diam:   2.00 cm   LV E/e' lateral: 10.9  LV SV:     61  LV SV Index:  34  LVOT Area:   3.14 cm   Laboratory Data:  High Sensitivity Troponin:   Recent Labs  Lab 10/28/20 2031  11/09/20 1827  TROPONINIHS 17 53*      Chemistry Recent Labs  Lab 11/09/20 1250  NA 134*  K 3.1*  CL 99  CO2 22  GLUCOSE 243*  BUN 31*  CREATININE 1.76*  CALCIUM 12.2*  GFRNONAA 31*  ANIONGAP 13    Recent Labs  Lab 11/09/20 1250  PROT 7.2  ALBUMIN 3.8  AST 18  ALT 13  ALKPHOS 99  BILITOT 0.7   Hematology Recent Labs  Lab 11/09/20 1250  WBC 28.6*  RBC 5.12*  HGB 15.6*  HCT 44.0  MCV 85.9  MCH 30.5  MCHC 35.5  RDW 12.4  PLT 235   BNPNo results for input(s): BNP, PROBNP in the last 168 hours.  DDimer No results for input(s): DDIMER in the last 168 hours.   Radiology/Studies:  CT ABDOMEN PELVIS WO CONTRAST  Result Date: 11/09/2020 CLINICAL DATA:  Abdominal pain, nausea, vomiting and diarrhea for 3 days. EXAM: CT ABDOMEN AND PELVIS WITHOUT CONTRAST TECHNIQUE: Multidetector CT imaging of the abdomen and pelvis was performed following the standard protocol without IV contrast. COMPARISON:  10/28/2020 FINDINGS: Lower chest: Subpleural dependent atelectasis/edema mainly on the right side. No infiltrates or effusions. The heart is normal in size. No pericardial effusion. Moderate aortic calcifications. Hepatobiliary: No focal hepatic lesions are identified without contrast. The gallbladder is surgically absent. Stable mild intrahepatic and moderate extrahepatic biliary dilatation. The common bile duct measures approximately 16 mm in the porta hepatis and a maximum of 10 mm in the head of the pancreas. Pancreas: No mass, inflammation or ductal dilatation. Mild diffuse pancreatic atrophy. Spleen: Normal size.  No focal lesions. Adrenals/Urinary Tract: The adrenal glands are unremarkable. Stable numerous bilateral renal cyst several of which have rim like calcifications. There also speckled parenchymal calcifications throughout both kidneys. No obvious worrisome renal mass or hydroureteronephrosis. The bladder is unremarkable. Stomach/Bowel: The stomach, duodenum, small bowel and  colon are grossly normal without oral contrast. No acute inflammatory changes, mass lesions or obstructive findings. Scattered colonic diverticulosis without findings for acute diverticulitis. Vascular/Lymphatic: Stable moderate atherosclerotic calcifications involving the aorta and iliac arteries without focal aneurysm. No mesenteric or retroperitoneal mass or adenopathy. Reproductive: Surgically absent. Other: No pelvic mass or adenopathy. No free pelvic fluid collections. No inguinal mass or adenopathy. No abdominal wall hernia or subcutaneous lesions. Musculoskeletal: Stable degenerative changes involving the spine and hips. No worrisome bone lesions. Moderate calcific tendinitis involving the hamstring tendons. There is also chondrocalcinosis noted at the hip joints and the pubic symphysis. IMPRESSION: 1. No acute abdominal/pelvic findings, mass lesions or adenopathy. 2. Stable numerous bilateral renal cysts. 3. Status post cholecystectomy with stable mild intrahepatic and moderate extrahepatic biliary dilatation. 4. Aortic atherosclerosis. Aortic Atherosclerosis (ICD10-I70.0). Electronically Signed   By: Marijo Sanes M.D.   On: 11/09/2020 16:40   DG Chest Port 1 View  Result Date: 11/09/2020 CLINICAL DATA:  Pain. Additional history provided: Patient reports vomiting and diarrhea for 3 days. EXAM: PORTABLE CHEST 1 VIEW COMPARISON:  CT abdomen/pelvis performed earlier the same day 11/09/2020. Chest radiographs 09/25/2020. FINDINGS: Left chest dual lead implantable cardiac device. Cardiac monitoring leads  overlie the chest. Unchanged cardiomegaly. Aortic atherosclerosis. No appreciable airspace consolidation or pulmonary edema. No evidence of pleural effusion or pneumothorax. No acute bony abnormality identified. IMPRESSION: No evidence of acute cardiopulmonary abnormality. Unchanged cardiomegaly. Aortic Atherosclerosis (ICD10-I70.0). Electronically Signed   By: Kellie Simmering DO   On: 11/09/2020 19:04      Assessment and Plan:   1. Acute chest pain with ST elevation V3-V5 and II,II,AVF emergently to cath lab   2.     CAD with prior cath 2019 with Ost Ramus lesion is 70% stenosed -relatively small caliber vessel. Not likely because of resting chest pain and ST elevations Ost 1st Diag lesion is 60% stenosed. -Not likely flow-limiting  Very small caliber (<52mm) 1st Mrg lesion appears to be 100% stenosed with faint collateralization. Too small for angioplasty or stenting.  On coreg, ASA    3. N/V and abd pain. Neg CT abd will ask [facility] to assist  4. Elevated WBC 28.6 , Neut 24.5  Blood cultures pending check lactic acid -u/a stable 5. DM-2 with hyperglycemia will ask [facility] to assist - SSI for now 6. HTN on amlodipine, apresoline zestril  7. HLD on crestor continue  8.  Hypokalemia will recheck with mg+ stat  9.  PPM last interrogation stable.  She could be transferred to progressive if bed needed.        TIMI Risk Score for ST  Elevation MI:   The patient's TIMI risk score is 4, which indicates a 7.3% risk of all cause mortality at 30 days.       Severity of Illness: The appropriate patient status for this patient is INPATIENT. Inpatient status is judged to be reasonable and necessary in order to provide the required intensity of service to ensure the patient's safety. The patient's presenting symptoms, physical exam findings, and initial radiographic and laboratory data in the context of their chronic comorbidities is felt to place them at high risk for further clinical deterioration. Furthermore, it is not anticipated that the patient will be medically stable for discharge from the hospital within 2 midnights of admission. The following factors support the patient status of inpatient.   " The patient's presenting symptoms include chest pain, N/V/D. " The worrisome physical exam findings include N/V/D abd pain. " The initial radiographic and laboratory data are worrisome because of  ST elevation on EKG. " The chronic co-morbidities include CAD, DM HTN .   * I certify that at the point of admission it is my clinical judgment that the patient will require inpatient hospital care spanning beyond 2 midnights from the point of admission due to high intensity of service, high risk for further deterioration and high frequency of surveillance required.*    For questions or updates, please contact [place] Please consult [email or web address] for contact info under     Signed, Cecilie Kicks, NP  11/09/2020 9:08 PM    ATTENDING ATTESTATION  I have seen, examined and evaluated the patient this PM along with Cecilie Kicks, NP-C.  After reviewing all the available data and chart, we discussed the patients laboratory, study & physical findings as well as symptoms in detail. I agree with her findings, examination as well as impression recommendations as per our discussion.    Attending adjustments noted in italics.    Patient with known history of pacemaker, diabetes, hypertension hyperlipidemia with previous evaluation for abnormal EKG, ST elevations but no occlusion back in November 2019 who presented any pain ER with nausea vomiting and poor  p.o. intake over the last couple days.  Apparently has been treated for UTI.  Found to have elevated CBG.  EKG showed again possible ST elevation MI with ST elevations in V3 V4 V5 as well as I I, II I and aVF.  Although these EKGs look very similar to 2 years ago, the fact she is having intermittent ongoing chest pain, we decided it prudent to proceed with cardiac catheterization & possible PCI.  Not sure if this is truly a STEMI - but EKG is concerning.      Glenetta Hew, M.D., M.S. Interventional Cardiologist   Pager # [phone] Phone # [phone] [address] [place], [place] [address]

## 2020-11-10 ENCOUNTER — Encounter (HOSPITAL_COMMUNITY): Payer: Self-pay | Admitting: Cardiology

## 2020-11-10 ENCOUNTER — Inpatient Hospital Stay (HOSPITAL_COMMUNITY): Payer: Medicare Other

## 2020-11-10 DIAGNOSIS — I5032 Chronic diastolic (congestive) heart failure: Secondary | ICD-10-CM | POA: Diagnosis not present

## 2020-11-10 DIAGNOSIS — R079 Chest pain, unspecified: Secondary | ICD-10-CM

## 2020-11-10 DIAGNOSIS — I361 Nonrheumatic tricuspid (valve) insufficiency: Secondary | ICD-10-CM

## 2020-11-10 DIAGNOSIS — I213 ST elevation (STEMI) myocardial infarction of unspecified site: Secondary | ICD-10-CM | POA: Diagnosis not present

## 2020-11-10 LAB — CBC
HCT: 42.7 % (ref 36.0–46.0)
Hemoglobin: 15.4 g/dL — ABNORMAL HIGH (ref 12.0–15.0)
MCH: 31 pg (ref 26.0–34.0)
MCHC: 36.1 g/dL — ABNORMAL HIGH (ref 30.0–36.0)
MCV: 86.1 fL (ref 80.0–100.0)
Platelets: 223 10*3/uL (ref 150–400)
RBC: 4.96 MIL/uL (ref 3.87–5.11)
RDW: 12.6 % (ref 11.5–15.5)
WBC: 24.5 10*3/uL — ABNORMAL HIGH (ref 4.0–10.5)
nRBC: 0 % (ref 0.0–0.2)

## 2020-11-10 LAB — ECHOCARDIOGRAM LIMITED
Area-P 1/2: 2.24 cm2
Height: 64 in
S' Lateral: 2.8 cm
Weight: 2423.3 oz

## 2020-11-10 LAB — COMPREHENSIVE METABOLIC PANEL
ALT: 13 U/L (ref 0–44)
AST: 14 U/L — ABNORMAL LOW (ref 15–41)
Albumin: 3.5 g/dL (ref 3.5–5.0)
Alkaline Phosphatase: 102 U/L (ref 38–126)
Anion gap: 11 (ref 5–15)
BUN: 24 mg/dL — ABNORMAL HIGH (ref 8–23)
CO2: 20 mmol/L — ABNORMAL LOW (ref 22–32)
Calcium: 11.7 mg/dL — ABNORMAL HIGH (ref 8.9–10.3)
Chloride: 106 mmol/L (ref 98–111)
Creatinine, Ser: 1.51 mg/dL — ABNORMAL HIGH (ref 0.44–1.00)
GFR, Estimated: 37 mL/min — ABNORMAL LOW (ref >60)
Glucose, Bld: 228 mg/dL — ABNORMAL HIGH (ref 70–99)
Potassium: 3.4 mmol/L — ABNORMAL LOW (ref 3.5–5.1)
Sodium: 137 mmol/L (ref 135–145)
Total Bilirubin: 0.7 mg/dL (ref 0.3–1.2)
Total Protein: 6.7 g/dL (ref 6.5–8.1)

## 2020-11-10 LAB — GLUCOSE, CAPILLARY
Glucose-Capillary: 301 mg/dL — ABNORMAL HIGH (ref 70–99)
Glucose-Capillary: 220 mg/dL — ABNORMAL HIGH (ref 70–99)
Glucose-Capillary: 164 mg/dL — ABNORMAL HIGH (ref 70–99)
Glucose-Capillary: 153 mg/dL — ABNORMAL HIGH (ref 70–99)

## 2020-11-10 LAB — TROPONIN I (HIGH SENSITIVITY)
Troponin I (High Sensitivity): 57 ng/L — ABNORMAL HIGH (ref <18)
Troponin I (High Sensitivity): 49 ng/L — ABNORMAL HIGH (ref <18)

## 2020-11-10 LAB — POCT ACTIVATED CLOTTING TIME: Activated Clotting Time: 160 seconds

## 2020-11-10 MED ORDER — K PHOS MONO-SOD PHOS DI & MONO 155-852-130 MG PO TABS
500.0000 mg | ORAL_TABLET | Freq: Once | ORAL | Status: AC
  Administered 2020-11-10: 500 mg via ORAL
  Filled 2020-11-10: qty 2

## 2020-11-10 MED ORDER — CHLORHEXIDINE GLUCONATE CLOTH 2 % EX PADS
6.0000 | MEDICATED_PAD | Freq: Every day | CUTANEOUS | Status: DC
  Administered 2020-11-10 – 2020-11-15 (×6): 6 via TOPICAL

## 2020-11-10 MED ORDER — PERFLUTREN LIPID MICROSPHERE
1.0000 mL | INTRAVENOUS | Status: AC | PRN
  Administered 2020-11-10: 2 mL via INTRAVENOUS
  Filled 2020-11-10: qty 10

## 2020-11-10 MED ORDER — INSULIN GLARGINE 100 UNIT/ML ~~LOC~~ SOLN
5.0000 [IU] | Freq: Every day | SUBCUTANEOUS | Status: DC
  Administered 2020-11-10 – 2020-11-16 (×7): 5 [IU] via SUBCUTANEOUS
  Filled 2020-11-10 (×8): qty 0.05

## 2020-11-10 MED ORDER — CARVEDILOL 12.5 MG PO TABS
12.5000 mg | ORAL_TABLET | Freq: Two times a day (BID) | ORAL | Status: DC
  Administered 2020-11-10 – 2020-11-16 (×14): 12.5 mg via ORAL
  Filled 2020-11-10 (×15): qty 1

## 2020-11-10 MED ORDER — INSULIN DEGLUDEC 100 UNIT/ML ~~LOC~~ SOPN
5.0000 [IU] | PEN_INJECTOR | Freq: Every day | SUBCUTANEOUS | Status: DC
  Filled 2020-11-10: qty 3

## 2020-11-10 MED ORDER — POTASSIUM CHLORIDE CRYS ER 20 MEQ PO TBCR
40.0000 meq | EXTENDED_RELEASE_TABLET | Freq: Once | ORAL | Status: AC
  Administered 2020-11-10: 40 meq via ORAL
  Filled 2020-11-10: qty 2

## 2020-11-10 MED ORDER — ROSUVASTATIN CALCIUM 20 MG PO TABS
20.0000 mg | ORAL_TABLET | Freq: Every day | ORAL | Status: DC
  Administered 2020-11-10 – 2020-11-16 (×7): 20 mg via ORAL
  Filled 2020-11-10 (×8): qty 1

## 2020-11-10 MED ORDER — PERFLUTREN LIPID MICROSPHERE
INTRAVENOUS | Status: AC
  Filled 2020-11-10: qty 20

## 2020-11-10 MED ORDER — ROSUVASTATIN CALCIUM 5 MG PO TABS: 10.0000 mg | ORAL_TABLET | Freq: Every day | ORAL | Status: DC

## 2020-11-10 MED FILL — Verapamil HCl IV Soln 2.5 MG/ML: INTRAVENOUS | Qty: 2 | Status: AC

## 2020-11-10 NOTE — Progress Notes (Signed)
  Echocardiogram 2D Echocardiogram has been performed.  Geoffery Lyons [place] 11/10/2020, 11:49 AM

## 2020-11-10 NOTE — Progress Notes (Signed)
Progress Note  Patient Name: Melissa Lopez Date of Encounter: 11/10/2020  [facility] Cardiologist: Carlyle Dolly, MD   Subjective   Feels nauseas this morning. Improving after receiving zofran. Denies chest pain or SOB. Right groin access site c/d/i.  Inpatient Medications    Scheduled Meds: . amLODipine  10 mg Oral Daily  . aspirin EC  81 mg Oral Daily  . buPROPion  150 mg Oral Daily  . carvedilol  6.25 mg Oral BID WC  . cephALEXin  500 mg Oral Q12H  . Chlorhexidine Gluconate Cloth  6 each Topical Daily  . escitalopram  20 mg Oral Daily  . heparin  5,000 Units Subcutaneous Q8H  . hydrALAZINE  25 mg Oral QHS  . influenza vaccine adjuvanted  0.5 mL Intramuscular Tomorrow-1000  . insulin aspart  0-15 Units Subcutaneous TID WC  . insulin aspart  0-5 Units Subcutaneous QHS  . metoCLOPramide  10 mg Oral TID AC & HS  . oxybutynin  10 mg Oral Daily  . pantoprazole  40 mg Oral Daily  . pneumococcal 23 valent vaccine  0.5 mL Intramuscular Tomorrow-1000  . polyethylene glycol  17 g Oral BID  . rosuvastatin  5 mg Oral Daily  . sodium chloride flush  3 mL Intravenous Q12H  . traZODone  100 mg Oral QHS   Continuous Infusions: . sodium chloride 125 mL/hr at 11/10/20 0600  . sodium chloride    . sodium chloride 125 mL/hr at 11/10/20 0200   PRN Meds: sodium chloride, lip balm, LORazepam, ondansetron (ZOFRAN) IV, sodium chloride flush   Vital Signs    Vitals:   11/10/20 0430 11/10/20 0500 11/10/20 0530 11/10/20 0600  BP: (!) 120/98 (!) 141/97 (!) 139/95   Pulse: 98 97 94 (!) 101  Resp: 15 15 18 16   Temp:      TempSrc:      SpO2: 96% 96% 96% 96%  Weight:      Height:        Intake/Output Summary (Last 24 hours) at 11/10/2020 0726 Last data filed at 11/10/2020 0600 Gross per 24 hour  Intake 1524.99 ml  Output 750 ml  Net 774.99 ml   Last 3 Weights 11/09/2020 11/09/2020 10/28/2020  Weight (lbs) 151 lb 7.3 oz 163 lb 2.3 oz 163 lb 2.3 oz  Weight (kg) 68.7 kg 74 kg  74 kg      Telemetry    NSR with occasional PVCs - Personally Reviewed  ECG    No new ecg - Personally Reviewed  Physical Exam   GEN: No acute distress.  Laying in bed. Neck: No JVD Cardiac: RRR, no murmurs, rubs, or gallops.  Respiratory: Clear to auscultation bilaterally. GI: Soft, nontender, non-distended  MS: No edema; No deformity. Right groin access site c/d/i Neuro:  Nonfocal  Psych: Normal affect   Labs    High Sensitivity Troponin:   Recent Labs  Lab 10/28/20 2031 11/09/20 1827 11/09/20 2215 11/10/20 0040 11/10/20 0426  TROPONINIHS 17 53* 65* 57* 49*      Chemistry Recent Labs  Lab 11/09/20 1250 11/09/20 2215 11/10/20 0040  NA 134* 137 137  K 3.1* 3.3* 3.4*  CL 99 104 106  CO2 22 21* 20*  GLUCOSE 243* 197* 228*  BUN 31* 23 24*  CREATININE 1.76* 1.58* 1.51*  CALCIUM 12.2* 11.6* 11.7*  PROT 7.2  --  6.7  ALBUMIN 3.8  --  3.5  AST 18  --  14*  ALT 13  --  13  ALKPHOS 99  --  102  BILITOT 0.7  --  0.7  GFRNONAA 31* 35* 37*  ANIONGAP 13 12 11      Hematology Recent Labs  Lab 11/09/20 1250 11/09/20 2215 11/10/20 0040  WBC 28.6* 27.2* 24.5*  RBC 5.12* 4.90 4.96  HGB 15.6* 15.0 15.4*  HCT 44.0 42.6 42.7  MCV 85.9 86.9 86.1  MCH 30.5 30.6 31.0  MCHC 35.5 35.2 36.1*  RDW 12.4 12.5 12.6  PLT 235 221 223    BNPNo results for input(s): BNP, PROBNP in the last 168 hours.   DDimer No results for input(s): DDIMER in the last 168 hours.   Radiology    CT ABDOMEN PELVIS WO CONTRAST  Result Date: 11/09/2020 CLINICAL DATA:  Abdominal pain, nausea, vomiting and diarrhea for 3 days. EXAM: CT ABDOMEN AND PELVIS WITHOUT CONTRAST TECHNIQUE: Multidetector CT imaging of the abdomen and pelvis was performed following the standard protocol without IV contrast. COMPARISON:  10/28/2020 FINDINGS: Lower chest: Subpleural dependent atelectasis/edema mainly on the right side. No infiltrates or effusions. The heart is normal in size. No pericardial effusion.  Moderate aortic calcifications. Hepatobiliary: No focal hepatic lesions are identified without contrast. The gallbladder is surgically absent. Stable mild intrahepatic and moderate extrahepatic biliary dilatation. The common bile duct measures approximately 16 mm in the porta hepatis and a maximum of 10 mm in the head of the pancreas. Pancreas: No mass, inflammation or ductal dilatation. Mild diffuse pancreatic atrophy. Spleen: Normal size.  No focal lesions. Adrenals/Urinary Tract: The adrenal glands are unremarkable. Stable numerous bilateral renal cyst several of which have rim like calcifications. There also speckled parenchymal calcifications throughout both kidneys. No obvious worrisome renal mass or hydroureteronephrosis. The bladder is unremarkable. Stomach/Bowel: The stomach, duodenum, small bowel and colon are grossly normal without oral contrast. No acute inflammatory changes, mass lesions or obstructive findings. Scattered colonic diverticulosis without findings for acute diverticulitis. Vascular/Lymphatic: Stable moderate atherosclerotic calcifications involving the aorta and iliac arteries without focal aneurysm. No mesenteric or retroperitoneal mass or adenopathy. Reproductive: Surgically absent. Other: No pelvic mass or adenopathy. No free pelvic fluid collections. No inguinal mass or adenopathy. No abdominal wall hernia or subcutaneous lesions. Musculoskeletal: Stable degenerative changes involving the spine and hips. No worrisome bone lesions. Moderate calcific tendinitis involving the hamstring tendons. There is also chondrocalcinosis noted at the hip joints and the pubic symphysis. IMPRESSION: 1. No acute abdominal/pelvic findings, mass lesions or adenopathy. 2. Stable numerous bilateral renal cysts. 3. Status post cholecystectomy with stable mild intrahepatic and moderate extrahepatic biliary dilatation. 4. Aortic atherosclerosis. Aortic Atherosclerosis (ICD10-I70.0). Electronically Signed   By:  Marijo Sanes M.D.   On: 11/09/2020 16:40   CARDIAC CATHETERIZATION  Result Date: 11/09/2020  1st Mrg lesion is 100% stenosed.  Ost Ramus lesion is 65% stenosed.  Ost LAD to Prox LAD lesion is 15% stenosed with 60% stenosed side branch in Ost 1st Diag.  Ost Cx to Prox Cx lesion is 45% stenosed.  Diffuse mild CAD and very tortuous vessels. No true occlusive lesions. None  SUMMARY  Stable nonocclusive mild to moderate disease with no culprit lesion for ST elevation MI. ->  The true diagnosis is no longer STEMI -> very similar findings with that very similar presentation 2 years ago.  EF not assessed due to plan to conserve contrast.  Relatively low LVEDP-IV given 500 mL bolus. PLAN  Due to bed availability, she will be admitted to CT ICU, but could be transferred if necessary  We will consult [facility]  medicine service to assist with her severely elevated white blood cell count, already on antibiotics for possible UTI as well as profound nausea vomiting and hyperglycemia. ->  Concern for possible HONK (hyperosmotic nonketotic ketoacidosis Glenetta Hew, MD   DG Chest Port 1 View  Result Date: 11/09/2020 CLINICAL DATA:  Pain. Additional history provided: Patient reports vomiting and diarrhea for 3 days. EXAM: PORTABLE CHEST 1 VIEW COMPARISON:  CT abdomen/pelvis performed earlier the same day 11/09/2020. Chest radiographs 09/25/2020. FINDINGS: Left chest dual lead implantable cardiac device. Cardiac monitoring leads overlie the chest. Unchanged cardiomegaly. Aortic atherosclerosis. No appreciable airspace consolidation or pulmonary edema. No evidence of pleural effusion or pneumothorax. No acute bony abnormality identified. IMPRESSION: No evidence of acute cardiopulmonary abnormality. Unchanged cardiomegaly. Aortic Atherosclerosis (ICD10-I70.0). Electronically Signed   By: Kellie Simmering DO   On: 11/09/2020 19:04    Cardiac Studies   LHC 11/09/20:  1st Mrg lesion is 100% stenosed.  Ost Ramus lesion is  65% stenosed.  Ost LAD to Prox LAD lesion is 15% stenosed with 60% stenosed side branch in Ost 1st Diag.  Ost Cx to Prox Cx lesion is 45% stenosed.  Diffuse mild CAD and very tortuous vessels. No true occlusive lesions. None   SUMMARY  Stable nonocclusive mild to moderate disease with no culprit lesion for ST elevation MI. ->  The true diagnosis is no longer STEMI -> very similar findings with that very similar presentation 2 years ago.  EF not assessed due to plan to conserve contrast.  Relatively low LVEDP-IV given 500 mL bolus.   PLAN  Due to bed availability, she will be admitted to CT ICU, but could be transferred if necessary  We will consult [place] medicine service to assist with her severely elevated white blood cell count, already on antibiotics for possible UTI as well as profound nausea vomiting and hyperglycemia. ->  Concern for possible HONK (hyperosmotic nonketotic ketoacidosis     TTE 09/14/20: IMPRESSIONS 1. The endocardium in the apex is poorly visualized, cannot exclude  possible apical hypokinesis and/or apical hypertrophy, consider contrast  study. . Left ventricular ejection fraction, by estimation, is 55 to 60%.  The left ventricle has normal  function. Left ventricular endocardial border not optimally defined to  evaluate regional wall motion. There is moderate left ventricular  hypertrophy. Left ventricular diastolic parameters are consistent with  Grade I diastolic dysfunction (impaired  relaxation).  2. Right ventricular systolic function is normal. The right ventricular  size is normal.  3. The mitral valve is normal in structure. No evidence of mitral valve  regurgitation. No evidence of mitral stenosis.  4. The aortic valve is tricuspid. Aortic valve regurgitation is not  visualized. No aortic stenosis is present.  5. The inferior vena cava is normal in size with greater than 50%  respiratory variability, suggesting right atrial pressure of  3 mmHg.   Comparison(s): Echocardiogram done 11/01/18 showed and EF of 55-60%.   LHC 42/68/3419:  LV end diastolic pressure is mildly elevated.  Ost Ramus lesion is 70% stenosed -relatively small caliber vessel. Not likely because of resting chest pain and ST elevations.  Ost 1st Diag lesion is 60% stenosed. -Not likely flow-limiting.  Very small caliber (<61mm) 1st Mrg lesion appears to be 100% stenosed with faint collateralization. Too small for angioplasty or stenting.   SUMMARY:  Angiographically mild diffuse disease, only potentially occluded vessels are very very small branch of the major obtuse marginal.  Not likely a culprit lesion for significant anginal  chest pain.  Mild to moderately elevated LVEDP.  LV gram not performed due to renal insufficiency.  RECOMMENDATIONS:  Based on presentation, she will be admitted to the ICU but would have her stepdown status.  Since were not clear of the etiology of her chest pain, will restart IV heparin 8 hours after sheath removal until we are able to exclude PE.  Also for the possible small branch occlusion.  Check 2D echocardiogram to exclude pericardial effusion with possible pericarditis as etiology.  Also need to evaluate her cause for syncope.  She did not have chest pain with syncope and denies agree with this chest pain so they probably are not related.  ? Check 2D echocardiogram ? Interrogate pacemaker ? Monitor for arrhythmias on telemetry  Recommend Aspirin 81mg  daily for moderate CAD.  With renal insufficiency, will need to consider having internal medicine/nephrology follow especially with her diabetes. -Quite likely multiple noncardiac issues.         Patient Profile     69 y.o. 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 Emergent cath performed which revealed stable moderate nonobstructive CAD without evidence of a culprit lesions. She was transferred to ICU due to bed shortage for continued medical management.  Assessment & Plan    #Moderate, nonobstructive CAD #Chest Pain: Patient presented with chest pain with STE in V3-V6 as well as inferior leads taken emergently to cath lab which revealed moderate, non-obstructive disease without culprit or lesions that merit intervention. Findings similar to previous cath in 2019. Now pursuing continued medical therapy. -Cath with mild-to-moderate nonobstructive disease with no lesions that merit intervention -Continue ASA 81mg  daily -Increase rosuvastatin to 20mg  daily -Increase coreg to 12.5mg  BID for better blood pressure control -Resume home zestril tomorrow if Cr remains stable  #Chronic Diastolic Heart Failure: Filling pressures low on cath. Appears compensated. NYHA class II symptoms. -Up-titrate coreg to 12.5mg  BID -Resume home zestril tomorrow pending stability in renal function -Follow-up TTE  #Hypertension: Elevated this morning. Has history of hypotension/dizziness with medication titration in the past. Will increase coreg and continue to monitor. 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 Reactive in the setting of nausea/vomiting vs infectious etiology.  -IM consulted, appreciate recommendations -Follow-up culture data  #CKDIII Cr at baseline.  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 -Management per internal medicine -Continue zofran and  reglan  #DMII: -Management per IM   For questions or updates, please contact [facility] Please consult [email or web address] for contact info under        Signed, Freada Bergeron, MD  11/10/2020, 7:26 AM

## 2020-11-10 NOTE — Progress Notes (Signed)
PROGRESS NOTE  Melissa Lopez  DOB: 08/11/1951  PCP: Celene Squibb, MD DGL:[id number]  DOA: 11/09/2020  LOS: 1 day   Chief Complaint  Patient presents with  . Emesis   Brief narrative: Melissa Lopez is a 69 y.o. 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 On 12/2, patient presented to the ED at [facility] with complaint of 2 weeks history of worsening nausea, vomiting and diarrhea associated with decreased oral intake.  Patient was admitted in October with similar symptoms with no clear etiology of her intractable nausea and vomiting.  She had multiple negative CT scans.  EGD showed bile reflux suggestive of gastric dysmotility versus reflux gastritis.  Her symptoms resolved and she was discharged.  She did not tolerate gastric emptying studies. On 11/20, patient returned back to the ED with similar symptoms and left lower quadrant pain.  She was noted to have UTI, started on Keflex and discharged to home. However her symptoms persisted and hence patient presented to the ED again on 12/2.  Marijuana use was also suspected to be one of the possible causes of her symptoms.  Patient states that she has not smoked marijuana for last 1 month.  In the ED, patient's blood pressure was elevated to 140s, otherwise stable. Labs with sodium 134, potassium 3.1, creatinine up at 1.76, WC count elevated 28.6 CT scan of abdomen did not show any acute findings. Initial troponin was negative.  However her EKG was concerning for ST elevation in multiple leads. Patient was transported emergently to: Cath Lab and underwent cardiac cath. Hospitalist service was consulted for comanagement of medical issues.  Subjective: Patient was seen and examined this morning. Pleasant elderly Caucasian female. Lying down in bed.  Not in distress.   Last emesis this morning.  Last episode of diarrhea 3 days ago.  Chart reviewed. Tachycardic to less than 110 this morning, blood pressure elevated to 160s, on room air Last set of labs from last night creatinine 1.51, potassium 3.4, troponin mildly elevated, WC count 24.5  Assessment/Plan: Acute chest pain with ST elevation in multiple EKG leads History of abnormal EKG and nonobstructive CAD -Apparently patient has abnormal EKG findings with ST elevation in the past as well.  She underwent cardiac cath in 2019 and had nonobstructive CAD. -This admission, with a presenting symptoms of chest pain and persistent ST elevation on EKG, she underwent cardiac cath again.  She was noted to have distal vessel stenosis, not flow-limiting and not amenable for angioplasty. -Continue Coreg and aspirin.  Intractable nausea and vomiting Chronic nausea and vomiting -Extensive work-up in October found source likely due to bile reflux gastritis versus diabetic gastroparesis. Has quit THC since that time. -Has been unable to tolerate gastric emptying studies in the past per her report -Previous GI evaluation stated patient may not benefit from Reglan long-term unless she has symptoms without it and results with it -Currently on Reglan trial with 10 mg 3 times daily scheduled -Continue as needed Zofran and supportive care. -On normal saline at 75 mill per hour. -Encourage oral intake.  Currently on clear liquid diet.  Chronic diastolic CHF Essential hypertension  -Echo from October 2021 with EF 55 to 60% and grade 1 diastolic dysfunction. -Home medications include Coreg 6.25 mg twice daily, amlodipine 10 mg daily, hydralazine 25 mg at bedtime, lisinopril 20 mg daily -Currently on  Coreg 12.5 milligrams twice daily and continuation of other medications. -Continue to monitor blood pressure and heart for symptoms. -As needed labetalol.  History of symptomatic bradycardia status post pacemaker - Per  Cardiology   Chronic hypercalcemia Primary hyperparathyroidism -Calcium 12.2 on presentation. -Patient reports elevated calcium level for last 15 years.  She apparently has a known nodule for work-up in [place].  However parathyroid scan in October 2021 was negative. -Continue IV fluid.  Continue to trend calcium level. Recent Labs  Lab 11/09/20 1250 11/09/20 2215 11/10/20 0040  CALCIUM 12.2* 11.6* 11.7*   Leukocytosis > WBC 28 in ED with left shift - Had similarly elevated white count during previous admission for nausea and vomiting, possibly reactive leukocytosis - No signs of infection on chest x-ray, urinalysis or CT abdomen - We will treat supportively for now - Trend fever curve and white count Recent Labs  Lab 11/09/20 1250 11/09/20 2215 11/10/20 0040  WBC 28.6* 27.2* 24.5*  LATICACIDVEN  --  1.2  --    CKD 3 -Creatinine at baseline. Recent Labs    09/25/20 0515 09/26/20 0508 09/27/20 0517 09/28/20 0504 09/29/20 0452 10/10/20 1504 10/28/20 1838 11/09/20 1250 11/09/20 2215 11/10/20 0040  BUN 33* 25* 18 17 17 23  25* 31* 23 24*  CREATININE 2.09* 1.99* 1.99* 1.89* 1.74* 1.83* 1.98* 1.76* 1.58* 1.51*   Hypokalemia/hypophosphatemia -Potassium level low at 3.4 and phosphorus level 2.3 on last blood work. -Oral replacement ordered.  Recheck tomorrow. Recent Labs  Lab 11/09/20 1250 11/09/20 2215 11/10/20 0040  K 3.1* 3.3* 3.4*  MG  --  2.1  --   PHOS  --  2.3*  --    Type 2 diabetes mellitus Hyperglycemia -Last A1c 6.5 on 10/17. -On Tresiba 5 units daily at home. -Blood sugar running high because of inability to use insulin -Currently sliding scale insulin with Accu-Cheks. -Blood sugar over 300 this morning.  I would resume [place]. Recent Labs  Lab 11/09/20 2317 11/10/20 0846 11/10/20 1148  GLUCAP 210* 301* 220*   Anxiety and depression - Continue home bupropion, escitalopram, trazodone, as needed lorazepam  Mobility: Uses walker at  home.   Code Status:   Code Status: Full Code  Nutritional status: Body mass index is 26 kg/m.     Diet Order            Diet clear liquid Room service appropriate? Yes; Fluid consistency: Thin  Diet effective now                 DVT prophylaxis: heparin injection 5,000 Units Start: 11/10/20 0600   Antimicrobials:  None Fluid: Normal saline at 75 mill per hour Family Communication:  Not at bedside   Infusions:  . sodium chloride    . sodium chloride 125 mL/hr at 11/10/20 0200    Scheduled Meds: . amLODipine  10 mg Oral Daily  . aspirin EC  81 mg Oral Daily  . buPROPion  150 mg Oral Daily  . carvedilol  12.5 mg Oral BID WC  . cephALEXin  500 mg Oral Q12H  . Chlorhexidine Gluconate Cloth  6 each Topical Daily  . escitalopram  20 mg Oral Daily  . heparin  5,000 Units Subcutaneous Q8H  . hydrALAZINE  25 mg Oral QHS  . influenza vaccine adjuvanted  0.5 mL Intramuscular Tomorrow-1000  . insulin aspart  0-15 Units Subcutaneous TID WC  . insulin aspart  0-5 Units Subcutaneous QHS  . insulin glargine  5 Units Subcutaneous Daily  .  metoCLOPramide  10 mg Oral TID AC & HS  . oxybutynin  10 mg Oral Daily  . pantoprazole  40 mg Oral Daily  . pneumococcal 23 valent vaccine  0.5 mL Intramuscular Tomorrow-1000  . polyethylene glycol  17 g Oral BID  . rosuvastatin  20 mg Oral Daily  . sodium chloride flush  3 mL Intravenous Q12H  . traZODone  100 mg Oral QHS    Antimicrobials: Anti-infectives (From admission, onward)   Start     Dose/Rate Route Frequency Ordered Stop   11/09/20 2215  cephALEXin (KEFLEX) capsule 500 mg        500 mg Oral Every 12 hours 11/09/20 2144        PRN meds: sodium chloride, lip balm, LORazepam, ondansetron (ZOFRAN) IV, perflutren lipid microspheres (DEFINITY) IV suspension, sodium chloride flush   Objective: Vitals:   11/10/20 1000 11/10/20 1100  BP: (!) 171/96 (!) 149/97  Pulse: 95 64  Resp: (!) 22 20  Temp:    SpO2: 97% 96%     Intake/Output Summary (Last 24 hours) at 11/10/2020 1248 Last data filed at 11/10/2020 0800 Gross per 24 hour  Intake 1771.22 ml  Output 750 ml  Net 1021.22 ml   Filed Weights   11/09/20 1121 11/09/20 2100  Weight: 74 kg 68.7 kg   Weight change:  Body mass index is 26 kg/m.   Physical Exam: General exam: Not in physical distress.  Still nauseous.  Emesis 1 time this morning  skin: No rashes, lesions or ulcers. HEENT: Atraumatic, normocephalic, no obvious bleeding Lungs: Clear to auscultation bilaterally CVS: Regular rate and rhythm, no murmur GI/Abd soft, nontender, nondistended, bowel sound present CNS: Alert, awake, oriented x3 Psychiatry: Depressed look Extremities: No pedal edema, no calf tenderness  Data Review: I have personally reviewed the laboratory data and studies available.  Recent Labs  Lab 11/09/20 1250 11/09/20 2215 11/10/20 0040  WBC 28.6* 27.2* 24.5*  NEUTROABS 24.5*  --   --   HGB 15.6* 15.0 15.4*  HCT 44.0 42.6 42.7  MCV 85.9 86.9 86.1  PLT 235 221 223   Recent Labs  Lab 11/09/20 1250 11/09/20 2215 11/10/20 0040  NA 134* 137 137  K 3.1* 3.3* 3.4*  CL 99 104 106  CO2 22 21* 20*  GLUCOSE 243* 197* 228*  BUN 31* 23 24*  CREATININE 1.76* 1.58* 1.51*  CALCIUM 12.2* 11.6* 11.7*  MG  --  2.1  --   PHOS  --  2.3*  --     F/u labs ordered.  Signed, Terrilee Croak, MD [facility] 11/10/2020

## 2020-11-10 NOTE — Progress Notes (Signed)
°   11/10/20 1220  Clinical Encounter Type  Visited With Patient  Visit Type Spiritual support  Referral From Nurse  Consult/Referral To Chaplain  Chaplain responded. Patient stated she was not feeling well today. Chaplain provided prayer and ministry of emotional support.This note was prepared by Jeanine Luz, M.Div..  For questions please contact by phone [phone].

## 2020-11-10 NOTE — Progress Notes (Signed)
Inpatient Diabetes Program Recommendations  AACE/ADA: New Consensus Statement on Inpatient Glycemic Control (2015)  Target Ranges:  Prepandial:   less than 140 mg/dL      Peak postprandial:   less than 180 mg/dL (1-2 hours)      Critically ill patients:  140 - 180 mg/dL   Lab Results  Component Value Date   GLUCAP 301 (H) 11/10/2020   HGBA1C 6.5 (H) 09/24/2020    Review of Glycemic Control Results for KASHIRA, BEHUNIN (MRN [id number]) as of 11/10/2020 11:03  Ref. Range 11/09/2020 23:17 11/10/2020 08:46  Glucose-Capillary Latest Ref Range: 70 - 99 mg/dL 210 (H) 301 (H)   Diabetes history: DM 2 Outpatient Diabetes medications: Tresiba 5 units daily Current orders for Inpatient glycemic control:  Lantus 5 units daily Novolog moderate tid with meals and HS  Inpatient Diabetes Program Recommendations:    Note blood sugar increased this morning.  Patient did receive Solumedrol 125 mg at 1947 on 12/2.  This could have influenced blood sugar this AM as well.  Agree with orders.  Will follow.   A1C in October was 6.5% indicating excellent control of DM.   Thanks,  Adah Perl, RN, BC-ADM Inpatient Diabetes Coordinator Pager [phone] (8a-5p)

## 2020-11-11 DIAGNOSIS — R079 Chest pain, unspecified: Secondary | ICD-10-CM | POA: Diagnosis not present

## 2020-11-11 DIAGNOSIS — I5032 Chronic diastolic (congestive) heart failure: Secondary | ICD-10-CM | POA: Diagnosis not present

## 2020-11-11 DIAGNOSIS — I10 Essential (primary) hypertension: Secondary | ICD-10-CM

## 2020-11-11 LAB — CBC WITH DIFFERENTIAL/PLATELET
Abs Immature Granulocytes: 0.14 10*3/uL — ABNORMAL HIGH (ref 0.00–0.07)
Basophils Absolute: 0.1 10*3/uL (ref 0.0–0.1)
Basophils Relative: 0 %
Eosinophils Absolute: 0 10*3/uL (ref 0.0–0.5)
Eosinophils Relative: 0 %
HCT: 41.3 % (ref 36.0–46.0)
Hemoglobin: 14.8 g/dL (ref 12.0–15.0)
Immature Granulocytes: 1 %
Lymphocytes Relative: 14 %
Lymphs Abs: 3.2 10*3/uL (ref 0.7–4.0)
MCH: 31.2 pg (ref 26.0–34.0)
MCHC: 35.8 g/dL (ref 30.0–36.0)
MCV: 87.1 fL (ref 80.0–100.0)
Monocytes Absolute: 1.3 10*3/uL — ABNORMAL HIGH (ref 0.1–1.0)
Monocytes Relative: 6 %
Neutro Abs: 19 10*3/uL — ABNORMAL HIGH (ref 1.7–7.7)
Neutrophils Relative %: 79 %
Platelets: 237 10*3/uL (ref 150–400)
RBC: 4.74 MIL/uL (ref 3.87–5.11)
RDW: 12.7 % (ref 11.5–15.5)
WBC: 23.7 10*3/uL — ABNORMAL HIGH (ref 4.0–10.5)
nRBC: 0 % (ref 0.0–0.2)

## 2020-11-11 LAB — URINE CULTURE: Culture: NO GROWTH

## 2020-11-11 LAB — GLUCOSE, CAPILLARY
Glucose-Capillary: 162 mg/dL — ABNORMAL HIGH (ref 70–99)
Glucose-Capillary: 172 mg/dL — ABNORMAL HIGH (ref 70–99)
Glucose-Capillary: 102 mg/dL — ABNORMAL HIGH (ref 70–99)
Glucose-Capillary: 158 mg/dL — ABNORMAL HIGH (ref 70–99)

## 2020-11-11 LAB — MAGNESIUM: Magnesium: 2.2 mg/dL (ref 1.7–2.4)

## 2020-11-11 LAB — BASIC METABOLIC PANEL
Anion gap: 10 (ref 5–15)
BUN: 24 mg/dL — ABNORMAL HIGH (ref 8–23)
CO2: 23 mmol/L (ref 22–32)
Calcium: 12.4 mg/dL — ABNORMAL HIGH (ref 8.9–10.3)
Chloride: 105 mmol/L (ref 98–111)
Creatinine, Ser: 1.63 mg/dL — ABNORMAL HIGH (ref 0.44–1.00)
GFR, Estimated: 34 mL/min — ABNORMAL LOW (ref >60)
Glucose, Bld: 185 mg/dL — ABNORMAL HIGH (ref 70–99)
Potassium: 3.5 mmol/L (ref 3.5–5.1)
Sodium: 138 mmol/L (ref 135–145)

## 2020-11-11 LAB — PHOSPHORUS: Phosphorus: 1.8 mg/dL — ABNORMAL LOW (ref 2.5–4.6)

## 2020-11-11 MED ORDER — HYDROCODONE-ACETAMINOPHEN 5-325 MG PO TABS
1.0000 | ORAL_TABLET | Freq: Four times a day (QID) | ORAL | Status: DC | PRN
  Administered 2020-11-11 – 2020-11-14 (×10): 1 via ORAL
  Filled 2020-11-11 (×11): qty 1

## 2020-11-11 MED ORDER — POTASSIUM PHOSPHATES 15 MMOLE/5ML IV SOLN
30.0000 mmol | Freq: Once | INTRAVENOUS | Status: AC
  Administered 2020-11-11: 30 mmol via INTRAVENOUS
  Filled 2020-11-11: qty 10

## 2020-11-11 NOTE — Progress Notes (Signed)
PROGRESS NOTE  Melissa Lopez  DOB: 12-04-1951  PCP: Celene Squibb, MD IEP:[id number]  DOA: 11/09/2020  LOS: 2 days   Chief Complaint  Patient presents with  . Emesis   Brief narrative: Melissa Lopez is a 69 y.o. 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 On 12/2, patient presented to the ED at [facility] with complaint of 2 weeks history of worsening nausea, vomiting and diarrhea associated with decreased oral intake.  Patient was admitted in October with similar symptoms with no clear etiology of her intractable nausea and vomiting.  She had multiple negative CT scans.  EGD showed bile reflux suggestive of gastric dysmotility versus reflux gastritis.  Her symptoms resolved and she was discharged.  She did not tolerate gastric emptying studies. On 11/20, patient returned back to the ED with similar symptoms and left lower quadrant pain.  She was noted to have UTI, started on Keflex and discharged to home. However her symptoms persisted and hence patient presented to the ED again on 12/2.  Marijuana use was also suspected to be one of the possible causes of her symptoms.  Patient states that she has not smoked marijuana for last 1 month.  In the ED, patient's blood pressure was elevated to 140s, otherwise stable. Labs with sodium 134, potassium 3.1, creatinine up at 1.76, WC count elevated 28.6 CT scan of abdomen did not show any acute findings. Initial troponin was negative.  However her EKG was concerning for ST elevation in multiple leads. Patient was transported emergently to: Cath Lab and underwent cardiac cath. Hospitalist service was consulted for comanagement of medical issues.  Subjective: Patient was seen and examined this morning.  Lying down in bed.  States that see vomited this morning as well.   No recurrence of diarrhea. Blood pressure has been fluctuating wildly from 83/54 at 3 AM this morning to 172/92 at 11 AM.  Assessment/Plan: Acute chest pain with ST elevation in multiple EKG leads History of abnormal EKG and nonobstructive CAD -Apparently patient has abnormal EKG findings with ST elevation in the past as well.  She underwent cardiac cath in 2019 and had nonobstructive CAD. -This admission, with a presenting symptoms of chest pain and persistent ST elevation on EKG, she underwent cardiac cath again.  She was noted to have distal vessel stenosis, not flow-limiting and not amenable for angioplasty. -Continue Coreg and aspirin.  Intractable nausea and vomiting Chronic nausea and vomiting -Extensive work-up in October found source likely due to bile reflux gastritis versus diabetic gastroparesis. Has quit THC since that time. -Has been unable to tolerate gastric emptying studies in the past per her report.  We may need to try another one on Monday. -Previous GI evaluation stated patient may not benefit from Reglan long-term unless she has symptoms without it and results with it -Currently on Reglan trial with 10 mg 3 times daily scheduled -Continue as needed Zofran and supportive care. -On normal saline at 75 mill per hour.  Continue the same. -Encourage oral intake.  Currently on clear liquid diet.  Chronic diastolic CHF Essential hypertension  -Echo from October 2021 with EF 55 to 60% and grade 1 diastolic dysfunction. -Home medications include Coreg 6.25 mg twice daily, amlodipine 10 mg daily, hydralazine 25 mg at bedtime, lisinopril 20 mg daily -Continue all.  Coreg dose was increased to 12.5 mg daily. -  Continue to monitor blood pressure and heart for symptoms. -As needed labetalol.  History of symptomatic bradycardia status post pacemaker - Per Cardiology   Chronic hypercalcemia Primary hyperparathyroidism -Calcium 12.2 on presentation.  Initially improved with IV  fluid, it is going up again.  Likely secondary to dehydration.  I will continue IV hydration. -Patient's daughter reports elevated calcium level for last 20 years.  She apparently has a known nodule for work-up in [place].  However parathyroid scan in October 2021 was negative. Recent Labs  Lab 11/09/20 1250 11/09/20 2215 11/10/20 0040 11/11/20 0111  CALCIUM 12.2* 11.6* 11.7* 12.4*   Leukocytosis -WBC 28 in ED with left shift -Had similarly elevated white count during previous admission for nausea and vomiting, possibly reactive leukocytosis -No signs of infection on chest x-ray, urinalysis or CT abdomen -We will treat supportively for now -Trend fever curve and white count Recent Labs  Lab 11/09/20 1250 11/09/20 2215 11/10/20 0040 11/11/20 0111  WBC 28.6* 27.2* 24.5* 23.7*  LATICACIDVEN  --  1.2  --   --    CKD 3 -Creatinine at baseline. Recent Labs    09/26/20 0508 09/27/20 0517 09/28/20 0504 09/29/20 0452 10/10/20 1504 10/28/20 1838 11/09/20 1250 11/09/20 2215 11/10/20 0040 11/11/20 0111  BUN 25* 18 17 17 23  25* 31* 23 24* 24*  CREATININE 1.99* 1.99* 1.89* 1.74* 1.83* 1.98* 1.76* 1.58* 1.51* 1.63*   Hypokalemia/hypophosphatemia -This morning was phosphorus level low at 1.8. -Possible replacement ordered. Recent Labs  Lab 11/09/20 1250 11/09/20 2215 11/10/20 0040 11/11/20 0111  K 3.1* 3.3* 3.4* 3.5  MG  --  2.1  --  2.2  PHOS  --  2.3*  --  1.8*   Type 2 diabetes mellitus Hyperglycemia -Last A1c 6.5 on 10/17. -On Tresiba 5 units daily at home. -Blood sugar running high because of inability to use insulin -Currently on Lantus 5 units daily along with sliding scale insulin Accu-Cheks. -Because of poor appetite, patient is at risk of hypoglycemia..  Continue to monitor. Recent Labs  Lab 11/10/20 1148 11/10/20 1706 11/10/20 2112 11/11/20 0607 11/11/20 1057  GLUCAP 220* 164* 153* 162* 172*   Anxiety and depression - Continue home bupropion,  escitalopram, trazodone, as needed lorazepam  Mobility: Uses walker at home.   Code Status:   Code Status: Full Code  Nutritional status: Body mass index is 26 kg/m.     Diet Order            Diet clear liquid Room service appropriate? Yes; Fluid consistency: Thin  Diet effective now                 DVT prophylaxis: heparin injection 5,000 Units Start: 11/10/20 0600   Antimicrobials:  None Fluid: Normal saline at 75 mill per hour Family Communication: : Updated patient's daughter Ms. Rosita Kea on the phone.   Infusions:  . sodium chloride    . sodium chloride 75 mL/hr at 11/11/20 0230  . potassium PHOSPHATE IVPB (in mmol)      Scheduled Meds: . amLODipine  10 mg Oral Daily  . aspirin EC  81 mg Oral Daily  . buPROPion  150 mg Oral Daily  . carvedilol  12.5 mg Oral BID WC  . cephALEXin  500 mg Oral Q12H  . Chlorhexidine Gluconate Cloth  6 each Topical Daily  . escitalopram  20 mg Oral Daily  . heparin  5,000 Units Subcutaneous Q8H  . hydrALAZINE  25 mg Oral QHS  . influenza vaccine adjuvanted  0.5 mL Intramuscular Tomorrow-1000  . insulin aspart  0-15 Units Subcutaneous TID WC  . insulin aspart  0-5 Units Subcutaneous QHS  . insulin glargine  5 Units Subcutaneous Daily  . metoCLOPramide  10 mg Oral TID AC & HS  . oxybutynin  10 mg Oral Daily  . pantoprazole  40 mg Oral Daily  . pneumococcal 23 valent vaccine  0.5 mL Intramuscular Tomorrow-1000  . polyethylene glycol  17 g Oral BID  . rosuvastatin  20 mg Oral Daily  . sodium chloride flush  3 mL Intravenous Q12H  . traZODone  100 mg Oral QHS    Antimicrobials: Anti-infectives (From admission, onward)   Start     Dose/Rate Route Frequency Ordered Stop   11/09/20 2215  cephALEXin (KEFLEX) capsule 500 mg        500 mg Oral Every 12 hours 11/09/20 2144        PRN meds: sodium chloride, HYDROcodone-acetaminophen, lip balm, LORazepam, ondansetron (ZOFRAN) IV, sodium chloride flush   Objective: Vitals:    11/11/20 0732 11/11/20 1058  BP: (!) 170/99 (!) 172/92  Pulse: 77 69  Resp: 19 18  Temp: 98 F (36.7 C) (!) 97.5 F (36.4 C)  SpO2: 96% 100%    Intake/Output Summary (Last 24 hours) at 11/11/2020 1354 Last data filed at 11/11/2020 0858 Gross per 24 hour  Intake 240 ml  Output 2250 ml  Net -2010 ml   Filed Weights   11/09/20 1121 11/09/20 2100  Weight: 74 kg 68.7 kg   Weight change:  Body mass index is 26 kg/m.   Physical Exam: General exam: Not in physical distress.  Frustrated because of persistent vomiting skin: No rashes, lesions or ulcers. HEENT: Atraumatic, normocephalic, no obvious bleeding Lungs: Clear to auscultation bilaterally CVS: Regular rate and rhythm, no murmur GI/Abd soft, nontender, nondistended, bowel sound present CNS: Alert, awake, oriented x3 Psychiatry: Depressed look Extremities: No pedal edema, no calf tenderness  Data Review: I have personally reviewed the laboratory data and studies available.  Recent Labs  Lab 11/09/20 1250 11/09/20 2215 11/10/20 0040 11/11/20 0111  WBC 28.6* 27.2* 24.5* 23.7*  NEUTROABS 24.5*  --   --  19.0*  HGB 15.6* 15.0 15.4* 14.8  HCT 44.0 42.6 42.7 41.3  MCV 85.9 86.9 86.1 87.1  PLT 235 221 223 237   Recent Labs  Lab 11/09/20 1250 11/09/20 2215 11/10/20 0040 11/11/20 0111  NA 134* 137 137 138  K 3.1* 3.3* 3.4* 3.5  CL 99 104 106 105  CO2 22 21* 20* 23  GLUCOSE 243* 197* 228* 185*  BUN 31* 23 24* 24*  CREATININE 1.76* 1.58* 1.51* 1.63*  CALCIUM 12.2* 11.6* 11.7* 12.4*  MG  --  2.1  --  2.2  PHOS  --  2.3*  --  1.8*    F/u labs ordered.  Signed, Terrilee Croak, MD [facility] 11/11/2020

## 2020-11-11 NOTE — Progress Notes (Signed)
Progress Note  Patient Name: Melissa Lopez Date of Encounter: 11/11/2020  [facility] Cardiologist: Carlyle Dolly, MD   Subjective   Stable kidney function (creatinine 1.8 > 1.6 > 1.5 > 1.6).  Labile BP, down to 83/54 overnight but up to 172/92 this morning.  SPO2 100% on room air.  Echo yesterday showed normal biventricular function, no significant valvular disease.  Inpatient Medications    Scheduled Meds: . amLODipine  10 mg Oral Daily  . aspirin EC  81 mg Oral Daily  . buPROPion  150 mg Oral Daily  . carvedilol  12.5 mg Oral BID WC  . cephALEXin  500 mg Oral Q12H  . Chlorhexidine Gluconate Cloth  6 each Topical Daily  . escitalopram  20 mg Oral Daily  . heparin  5,000 Units Subcutaneous Q8H  . hydrALAZINE  25 mg Oral QHS  . influenza vaccine adjuvanted  0.5 mL Intramuscular Tomorrow-1000  . insulin aspart  0-15 Units Subcutaneous TID WC  . insulin aspart  0-5 Units Subcutaneous QHS  . insulin glargine  5 Units Subcutaneous Daily  . metoCLOPramide  10 mg Oral TID AC & HS  . oxybutynin  10 mg Oral Daily  . pantoprazole  40 mg Oral Daily  . pneumococcal 23 valent vaccine  0.5 mL Intramuscular Tomorrow-1000  . polyethylene glycol  17 g Oral BID  . rosuvastatin  20 mg Oral Daily  . sodium chloride flush  3 mL Intravenous Q12H  . traZODone  100 mg Oral QHS   Continuous Infusions: . sodium chloride    . sodium chloride 75 mL/hr at 11/11/20 0230   PRN Meds: sodium chloride, HYDROcodone-acetaminophen, lip balm, LORazepam, ondansetron (ZOFRAN) IV, sodium chloride flush   Vital Signs    Vitals:   11/11/20 0319 11/11/20 0345 11/11/20 0732 11/11/20 1058  BP: (!) 83/54 122/89 (!) 170/99 (!) 172/92  Pulse: 72  77 69  Resp: 18  19 18   Temp: 98.3 F (36.8 C)  98 F (36.7 C) (!) 97.5 F (36.4 C)  TempSrc: Oral  Oral Oral  SpO2: 98%  96% 100%  Weight:      Height:        Intake/Output Summary (Last 24 hours) at 11/11/2020 1302 Last data filed at 11/11/2020  0858 Gross per 24 hour  Intake 240 ml  Output 2250 ml  Net -2010 ml   Last 3 Weights 11/09/2020 11/09/2020 10/28/2020  Weight (lbs) 151 lb 7.3 oz 163 lb 2.3 oz 163 lb 2.3 oz  Weight (kg) 68.7 kg 74 kg 74 kg      Telemetry    NSR with occasional PVCs - Personally Reviewed  ECG    No new ecg - Personally Reviewed  Physical Exam   GEN: No acute distress.  Laying in bed. Neck: No JVD Cardiac: RRR, no murmurs, rubs, or gallops.  Respiratory: Clear to auscultation bilaterally. GI: Soft, nontender, non-distended  MS: No edema; No deformity. Right groin access site c/d/i Neuro:  Nonfocal  Psych: Normal affect   Labs    High Sensitivity Troponin:   Recent Labs  Lab 10/28/20 2031 11/09/20 1827 11/09/20 2215 11/10/20 0040 11/10/20 0426  TROPONINIHS 17 53* 65* 57* 49*      Chemistry Recent Labs  Lab 11/09/20 1250 11/09/20 1250 11/09/20 2215 11/10/20 0040 11/11/20 0111  NA 134*   < > 137 137 138  K 3.1*   < > 3.3* 3.4* 3.5  CL 99   < > 104 106 105  CO2  22   < > 21* 20* 23  GLUCOSE 243*   < > 197* 228* 185*  BUN 31*   < > 23 24* 24*  CREATININE 1.76*   < > 1.58* 1.51* 1.63*  CALCIUM 12.2*   < > 11.6* 11.7* 12.4*  PROT 7.2  --   --  6.7  --   ALBUMIN 3.8  --   --  3.5  --   AST 18  --   --  14*  --   ALT 13  --   --  13  --   ALKPHOS 99  --   --  102  --   BILITOT 0.7  --   --  0.7  --   GFRNONAA 31*   < > 35* 37* 34*  ANIONGAP 13   < > 12 11 10    < > = values in this interval not displayed.     Hematology Recent Labs  Lab 11/09/20 2215 11/10/20 0040 11/11/20 0111  WBC 27.2* 24.5* 23.7*  RBC 4.90 4.96 4.74  HGB 15.0 15.4* 14.8  HCT 42.6 42.7 41.3  MCV 86.9 86.1 87.1  MCH 30.6 31.0 31.2  MCHC 35.2 36.1* 35.8  RDW 12.5 12.6 12.7  PLT 221 223 237    BNPNo results for input(s): BNP, PROBNP in the last 168 hours.   DDimer No results for input(s): DDIMER in the last 168 hours.   Radiology    CT ABDOMEN PELVIS WO CONTRAST  Result Date:  11/09/2020 CLINICAL DATA:  Abdominal pain, nausea, vomiting and diarrhea for 3 days. EXAM: CT ABDOMEN AND PELVIS WITHOUT CONTRAST TECHNIQUE: Multidetector CT imaging of the abdomen and pelvis was performed following the standard protocol without IV contrast. COMPARISON:  10/28/2020 FINDINGS: Lower chest: Subpleural dependent atelectasis/edema mainly on the right side. No infiltrates or effusions. The heart is normal in size. No pericardial effusion. Moderate aortic calcifications. Hepatobiliary: No focal hepatic lesions are identified without contrast. The gallbladder is surgically absent. Stable mild intrahepatic and moderate extrahepatic biliary dilatation. The common bile duct measures approximately 16 mm in the porta hepatis and a maximum of 10 mm in the head of the pancreas. Pancreas: No mass, inflammation or ductal dilatation. Mild diffuse pancreatic atrophy. Spleen: Normal size.  No focal lesions. Adrenals/Urinary Tract: The adrenal glands are unremarkable. Stable numerous bilateral renal cyst several of which have rim like calcifications. There also speckled parenchymal calcifications throughout both kidneys. No obvious worrisome renal mass or hydroureteronephrosis. The bladder is unremarkable. Stomach/Bowel: The stomach, duodenum, small bowel and colon are grossly normal without oral contrast. No acute inflammatory changes, mass lesions or obstructive findings. Scattered colonic diverticulosis without findings for acute diverticulitis. Vascular/Lymphatic: Stable moderate atherosclerotic calcifications involving the aorta and iliac arteries without focal aneurysm. No mesenteric or retroperitoneal mass or adenopathy. Reproductive: Surgically absent. Other: No pelvic mass or adenopathy. No free pelvic fluid collections. No inguinal mass or adenopathy. No abdominal wall hernia or subcutaneous lesions. Musculoskeletal: Stable degenerative changes involving the spine and hips. No worrisome bone lesions. Moderate  calcific tendinitis involving the hamstring tendons. There is also chondrocalcinosis noted at the hip joints and the pubic symphysis. IMPRESSION: 1. No acute abdominal/pelvic findings, mass lesions or adenopathy. 2. Stable numerous bilateral renal cysts. 3. Status post cholecystectomy with stable mild intrahepatic and moderate extrahepatic biliary dilatation. 4. Aortic atherosclerosis. Aortic Atherosclerosis (ICD10-I70.0). Electronically Signed   By: Marijo Sanes M.D.   On: 11/09/2020 16:40   CARDIAC CATHETERIZATION  Result Date: 11/09/2020  1st  Mrg lesion is 100% stenosed.  Ost Ramus lesion is 65% stenosed.  Ost LAD to Prox LAD lesion is 15% stenosed with 60% stenosed side branch in Ost 1st Diag.  Ost Cx to Prox Cx lesion is 45% stenosed.  Diffuse mild CAD and very tortuous vessels. No true occlusive lesions. None  SUMMARY  Stable nonocclusive mild to moderate disease with no culprit lesion for ST elevation MI. ->  The true diagnosis is no longer STEMI -> very similar findings with that very similar presentation 2 years ago.  EF not assessed due to plan to conserve contrast.  Relatively low LVEDP-IV given 500 mL bolus. PLAN  Due to bed availability, she will be admitted to CT ICU, but could be transferred if necessary  We will consult [place] medicine service to assist with her severely elevated white blood cell count, already on antibiotics for possible UTI as well as profound nausea vomiting and hyperglycemia. ->  Concern for possible HONK (hyperosmotic nonketotic ketoacidosis Glenetta Hew, MD   DG Chest Port 1 View  Result Date: 11/09/2020 CLINICAL DATA:  Pain. Additional history provided: Patient reports vomiting and diarrhea for 3 days. EXAM: PORTABLE CHEST 1 VIEW COMPARISON:  CT abdomen/pelvis performed earlier the same day 11/09/2020. Chest radiographs 09/25/2020. FINDINGS: Left chest dual lead implantable cardiac device. Cardiac monitoring leads overlie the chest. Unchanged cardiomegaly.  Aortic atherosclerosis. No appreciable airspace consolidation or pulmonary edema. No evidence of pleural effusion or pneumothorax. No acute bony abnormality identified. IMPRESSION: No evidence of acute cardiopulmonary abnormality. Unchanged cardiomegaly. Aortic Atherosclerosis (ICD10-I70.0). Electronically Signed   By: Kellie Simmering DO   On: 11/09/2020 19:04   ECHOCARDIOGRAM LIMITED  Result Date: 11/10/2020    ECHOCARDIOGRAM LIMITED REPORT   Patient Name:   INELL MIMBS Date of Exam: 11/10/2020 Medical Rec #:  [id number]        Height:       64.0 in Accession #:    [id number]       Weight:       151.5 lb Date of Birth:  09/09/1951        BSA:          1.738 m Patient Age:    37 years         BP:           159/102 mmHg Patient Gender: F                HR:           71 bpm. Exam Location:  Inpatient Procedure: Limited Echo, Cardiac Doppler, Color Doppler and Intracardiac            Opacification Agent Indications:    Chest Pain 786.50 / R07.9  History:        Patient has prior history of Echocardiogram examinations, most                 recent 09/14/2020. CAD, Pacemaker; Risk Factors:Hypertension and                 Former Smoker.  Sonographer:    Vickie Epley RDCS Referring Phys: [place]  1. Left ventricular ejection fraction, by estimation, is 60 to 65%. The left ventricle has normal function. The left ventricle has no regional wall motion abnormalities. Left ventricular diastolic parameters are consistent with Grade I diastolic dysfunction (impaired relaxation).  2. Right ventricular systolic function is normal. The right ventricular size is normal. There is normal pulmonary artery systolic pressure.  3. The mitral valve was not well visualized. No evidence of mitral valve regurgitation. No evidence of mitral stenosis.  4. There is mild thickening of the aortic valve. Aortic valve regurgitation is not visualized. No aortic stenosis is present.  5. Aortic dilatation noted. There is  moderate dilatation of the ascending aorta, measuring 41 mm. FINDINGS  Left Ventricle: Left ventricular ejection fraction, by estimation, is 60 to 65%. The left ventricle has normal function. The left ventricle has no regional wall motion abnormalities. Definity contrast agent was given IV to delineate the left ventricular  endocardial borders. The left ventricular internal cavity size was normal in size. Left ventricular diastolic parameters are consistent with Grade I diastolic dysfunction (impaired relaxation). Right Ventricle: The right ventricular size is normal. No increase in right ventricular wall thickness. Right ventricular systolic function is normal. There is normal pulmonary artery systolic pressure. The tricuspid regurgitant velocity is 2.49 m/s, and  with an assumed right atrial pressure of 3 mmHg, the estimated right ventricular systolic pressure is 70.3 mmHg. Left Atrium: Left atrial size was normal in size. Right Atrium: Right atrial size was normal in size. Pericardium: There is no evidence of pericardial effusion. Mitral Valve: The mitral valve was not well visualized. No evidence of mitral valve stenosis. Tricuspid Valve: The tricuspid valve is normal in structure. Tricuspid valve regurgitation is mild. Aortic Valve: There is mild thickening of the aortic valve. Aortic valve regurgitation is not visualized. No aortic stenosis is present. Pulmonic Valve: The pulmonic valve was normal in structure. Pulmonic valve regurgitation is trivial. Aorta: Aortic dilatation noted. There is moderate dilatation of the ascending aorta, measuring 41 mm. IAS/Shunts: The atrial septum is grossly normal. LEFT VENTRICLE PLAX 2D LVIDd:         3.90 cm  Diastology LVIDs:         2.80 cm  LV e' medial:    2.06 cm/s LV PW:         1.20 cm  LV E/e' medial:  19.1 LV IVS:        1.20 cm  LV e' lateral:   4.88 cm/s LVOT diam:     1.80 cm  LV E/e' lateral: 8.1 LV SV:         46 LV SV Index:   26 LVOT Area:     2.54 cm  LEFT  ATRIUM         Index LA diam:    2.80 cm 1.61 cm/m  AORTIC VALVE LVOT Vmax:   106.00 cm/s LVOT Vmean:  68.700 cm/s LVOT VTI:    0.180 m  AORTA Ao Root diam: 3.70 cm Ao Asc diam:  4.10 cm MITRAL VALVE               TRICUSPID VALVE MV Area (PHT): 2.24 cm    TR Peak grad:   24.8 mmHg MV Decel Time: 338 msec    TR Vmax:        249.00 cm/s MV E velocity: 39.40 cm/s MV A velocity: 85.70 cm/s  SHUNTS MV E/A ratio:  0.46        Systemic VTI:  0.18 m                            Systemic Diam: 1.80 cm Mertie Moores MD Electronically signed by Mertie Moores MD Signature Date/Time: 11/10/2020/2:56:41 PM    Final     Cardiac Studies   LHC 11/09/20:  1st Mrg  lesion is 100% stenosed.  Ost Ramus lesion is 65% stenosed.  Ost LAD to Prox LAD lesion is 15% stenosed with 60% stenosed side branch in Ost 1st Diag.  Ost Cx to Prox Cx lesion is 45% stenosed.  Diffuse mild CAD and very tortuous vessels. No true occlusive lesions. None   SUMMARY  Stable nonocclusive mild to moderate disease with no culprit lesion for ST elevation MI. ->  The true diagnosis is no longer STEMI -> very similar findings with that very similar presentation 2 years ago.  EF not assessed due to plan to conserve contrast.  Relatively low LVEDP-IV given 500 mL bolus.   PLAN  Due to bed availability, she will be admitted to CT ICU, but could be transferred if necessary  We will consult [place] medicine service to assist with her severely elevated white blood cell count, already on antibiotics for possible UTI as well as profound nausea vomiting and hyperglycemia. ->  Concern for possible HONK (hyperosmotic nonketotic ketoacidosis     TTE 09/14/20: IMPRESSIONS 1. The endocardium in the apex is poorly visualized, cannot exclude  possible apical hypokinesis and/or apical hypertrophy, consider contrast  study. . Left ventricular ejection fraction, by estimation, is 55 to 60%.  The left ventricle has normal  function. Left ventricular  endocardial border not optimally defined to  evaluate regional wall motion. There is moderate left ventricular  hypertrophy. Left ventricular diastolic parameters are consistent with  Grade I diastolic dysfunction (impaired  relaxation).  2. Right ventricular systolic function is normal. The right ventricular  size is normal.  3. The mitral valve is normal in structure. No evidence of mitral valve  regurgitation. No evidence of mitral stenosis.  4. The aortic valve is tricuspid. Aortic valve regurgitation is not  visualized. No aortic stenosis is present.  5. The inferior vena cava is normal in size with greater than 50%  respiratory variability, suggesting right atrial pressure of 3 mmHg.   Comparison(s): Echocardiogram done 11/01/18 showed and EF of 55-60%.   LHC 95/08/3266:  LV end diastolic pressure is mildly elevated.  Ost Ramus lesion is 70% stenosed -relatively small caliber vessel. Not likely because of resting chest pain and ST elevations.  Ost 1st Diag lesion is 60% stenosed. -Not likely flow-limiting.  Very small caliber (<23mm) 1st Mrg lesion appears to be 100% stenosed with faint collateralization. Too small for angioplasty or stenting.   SUMMARY:  Angiographically mild diffuse disease, only potentially occluded vessels are very very small branch of the major obtuse marginal.  Not likely a culprit lesion for significant anginal chest pain.  Mild to moderately elevated LVEDP.  LV gram not performed due to renal insufficiency.  RECOMMENDATIONS:  Based on presentation, she will be admitted to the ICU but would have her stepdown status.  Since were not clear of the etiology of her chest pain, will restart IV heparin 8 hours after sheath removal until we are able to exclude PE.  Also for the possible small branch occlusion.  Check 2D echocardiogram to exclude pericardial effusion with possible pericarditis as etiology.  Also need to evaluate her cause for syncope.   She did not have chest pain with syncope and denies agree with this chest pain so they probably are not related.  ? Check 2D echocardiogram ? Interrogate pacemaker ? Monitor for arrhythmias on telemetry  Recommend Aspirin 81mg  daily for moderate CAD.  With renal insufficiency, will need to consider having internal medicine/nephrology follow especially with her diabetes. -Quite likely multiple  noncardiac issues.         Patient Profile     70 y.o. 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 Emergent cath performed which revealed stable moderate nonobstructive CAD without evidence of a culprit lesions. She was transferred to ICU due to bed shortage for continued medical management.  Assessment & Plan    #Moderate, nonobstructive CAD #Chest Pain: Patient presented with chest pain with STE in V3-V6 as well as inferior leads taken emergently to cath lab which revealed moderate, non-obstructive disease without culprit or lesions that merit intervention. Findings similar to previous cath in 2019. Now pursuing continued medical therapy.  Echo 12/3 showed normal biventricular function, no significant valvular disease. -Cath with mild-to-moderate nonobstructive disease with no lesions that merit intervention -Continue ASA 81mg  daily -Continue rosuvastatin to 20mg  daily -Continue coreg 12.5mg  BID f -Resume home zestril tomorrow if Cr remains stable  #Chronic Diastolic Heart Failure: Filling pressures low on cath. Appears compensated. NYHA class II symptoms. -Continue carvedilol 12.5mg  BID -Resume home zestril  pending stability in renal function -Echo yesterday showed normal biventricular function, grade 1 diastolic dysfunction, no  significant valvular disease.  #Hypertension: Elevated this morning. Has history of hypotension/dizziness with medication titration in the past. Will increase coreg and continue to monitor. 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 Reactive in the setting of nausea/vomiting vs infectious etiology.  -IM consulted, appreciate recommendations -Follow-up culture data  #CKDIII Cr at baseline.  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 -Management per internal medicine -Continue zofran and reglan  #DMII: -Management per IM   For questions or updates, please contact [facility] Please consult [email or web address] for contact info under        Signed, Donato Heinz, MD  11/11/2020, 1:02 PM

## 2020-11-11 NOTE — Progress Notes (Signed)
Mobility Specialist: Progress Note   11/11/20 1335  Mobility  Activity Ambulated in hall  Level of Assistance Contact guard assist, steadying assist  Assistive Device Front wheel walker  Distance Ambulated (ft) 60 ft  Mobility Response Tolerated fair  Mobility performed by Mobility specialist  Bed Position High-fowlers  $Mobility charge 1 Mobility   Pre-Mobility (Sitting EOB): 68 HR, 156/69 BP, 97% SpO2 Post-Mobility (Supine): 60 HR, 139/90 BP, 99% SpO2  Pt to BSC upon entering room. Pt's knees buckled after standing up from [facility] so I had pt sit back on EOB for a minute. Pt did not experience knee buckling during ambulation. After returning to room pt said her leg felt "rubbery." Pt said she would like to try to walk farther tomorrow.   [facility] Melissa Lopez Mobility Specialist

## 2020-11-12 DIAGNOSIS — N1832 Chronic kidney disease, stage 3b: Secondary | ICD-10-CM | POA: Diagnosis not present

## 2020-11-12 DIAGNOSIS — I10 Essential (primary) hypertension: Secondary | ICD-10-CM | POA: Diagnosis not present

## 2020-11-12 DIAGNOSIS — I5032 Chronic diastolic (congestive) heart failure: Secondary | ICD-10-CM | POA: Diagnosis not present

## 2020-11-12 DIAGNOSIS — R079 Chest pain, unspecified: Secondary | ICD-10-CM | POA: Diagnosis not present

## 2020-11-12 LAB — BASIC METABOLIC PANEL
Anion gap: 8 (ref 5–15)
BUN: 24 mg/dL — ABNORMAL HIGH (ref 8–23)
CO2: 21 mmol/L — ABNORMAL LOW (ref 22–32)
Calcium: 10.5 mg/dL — ABNORMAL HIGH (ref 8.9–10.3)
Chloride: 105 mmol/L (ref 98–111)
Creatinine, Ser: 1.51 mg/dL — ABNORMAL HIGH (ref 0.44–1.00)
GFR, Estimated: 37 mL/min — ABNORMAL LOW (ref >60)
Glucose, Bld: 132 mg/dL — ABNORMAL HIGH (ref 70–99)
Potassium: 3.3 mmol/L — ABNORMAL LOW (ref 3.5–5.1)
Sodium: 134 mmol/L — ABNORMAL LOW (ref 135–145)

## 2020-11-12 LAB — GLUCOSE, CAPILLARY
Glucose-Capillary: 151 mg/dL — ABNORMAL HIGH (ref 70–99)
Glucose-Capillary: 158 mg/dL — ABNORMAL HIGH (ref 70–99)
Glucose-Capillary: 138 mg/dL — ABNORMAL HIGH (ref 70–99)
Glucose-Capillary: 170 mg/dL — ABNORMAL HIGH (ref 70–99)

## 2020-11-12 MED ORDER — DEXTROSE-NACL 5-0.9 % IV SOLN: INTRAVENOUS | Status: DC

## 2020-11-12 MED ORDER — K PHOS MONO-SOD PHOS DI & MONO 155-852-130 MG PO TABS
250.0000 mg | ORAL_TABLET | Freq: Three times a day (TID) | ORAL | Status: DC
Start: 2020-11-12 — End: 2020-11-12

## 2020-11-12 MED ORDER — POTASSIUM CHLORIDE CRYS ER 20 MEQ PO TBCR
40.0000 meq | EXTENDED_RELEASE_TABLET | Freq: Once | ORAL | Status: AC
  Administered 2020-11-12: 40 meq via ORAL
  Filled 2020-11-12: qty 2

## 2020-11-12 NOTE — Plan of Care (Signed)
  Problem: Nutritional: Goal: Maintenance of adequate nutrition will improve Outcome: Not Progressing   

## 2020-11-12 NOTE — Progress Notes (Signed)
PROGRESS NOTE  Melissa Lopez  DOB: [birth year]  PCP: Celene Squibb, MD MVH:[id number]  DOA: 11/09/2020  LOS: 3 days   Chief Complaint  Patient presents with  . Emesis   Brief narrative: Melissa Lopez is a 69 y.o. 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 On 12/2, patient presented to the ED at [facility] with complaint of 2 weeks history of worsening nausea, vomiting and diarrhea associated with decreased oral intake.  Patient was admitted in October with similar symptoms with no clear etiology of her intractable nausea and vomiting.  She had multiple negative CT scans.  EGD showed bile reflux suggestive of gastric dysmotility versus reflux gastritis.  Her symptoms resolved and she was discharged.  She did not tolerate gastric emptying studies. On 11/20, patient returned back to the ED with similar symptoms and left lower quadrant pain.  She was noted to have UTI, started on Keflex and discharged to home. However her symptoms persisted and hence patient presented to the ED again on 12/2.  Marijuana use was also suspected to be one of the possible causes of her symptoms.  Patient states that she has not smoked marijuana for last 1 month.  In the ED, patient's blood pressure was elevated to 140s, otherwise stable. Labs with sodium 134, potassium 3.1, creatinine up at 1.76, WC count elevated 28.6 CT scan of abdomen did not show any acute findings. Initial troponin was negative.  However her EKG was concerning for ST elevation in multiple leads. Patient was transported emergently to: Cath Lab and underwent cardiac cath. Hospitalist service was consulted for comanagement of medical issues.  Subjective: Patient was seen and examined this morning.  Elderly Caucasian female.  Looks tired.  Sleeping.  Continues to  have nausea.  Vomited 1 time this morning.  Has not been able to tolerate renal food or hydration.  Assessment/Plan: Acute chest pain with ST elevation in multiple EKG leads History of abnormal EKG and nonobstructive CAD -Apparently patient has abnormal EKG findings with ST elevation in the past as well.  She underwent cardiac cath in 2019 and had nonobstructive CAD. -This admission, with a presenting symptoms of chest pain and persistent ST elevation on EKG, she underwent cardiac cath again.  She was noted to have distal vessel stenosis, not flow-limiting and not amenable for angioplasty. -Continue Coreg and aspirin.  Intractable nausea and vomiting Chronic nausea and vomiting -Extensive work-up in October found source likely due to bile reflux gastritis versus diabetic gastroparesis. Has quit THC since that time. -Has been unable to tolerate gastric emptying studies in the past per her report.  We may need to try another one on Monday. -Previous GI evaluation stated patient may not benefit from Reglan long-term unless she has symptoms without it and results with it -Currently on Reglan trial with 10 mg 3 times daily scheduled -Continue as needed Zofran and supportive care. -On normal saline at 75 mill per hour.  Because of her poor appetite, I would switch fluid to D5 NS at 100/h -Encourage oral intake.  Currently on clear liquid diet but she does not feel like eating. -We will try to obtain gastric emptying study tomorrow.  Chronic diastolic CHF Essential hypertension  -Echo from October 2021 with EF 55 to 60% and grade 1 diastolic dysfunction. -Home medications include Coreg 6.25 mg twice daily, amlodipine 10 mg  daily, hydralazine 25 mg at bedtime, lisinopril 20 mg daily -Continue all.  Coreg dose was increased to 12.5 mg daily. -Continue to monitor blood pressure and heart for symptoms. -As needed labetalol.  History of symptomatic bradycardia status post pacemaker - Per Cardiology    Chronic hypercalcemia Primary hyperparathyroidism -Calcium 12.2 on presentation.  Initially improved with IV fluid, it is going up again.  Likely secondary to dehydration.  I will continue IV hydration. -Patient's daughter reports elevated calcium level for last 20 years.  She apparently has a known nodule for work-up in [place].  However parathyroid scan in October 2021 was negative. Recent Labs  Lab 11/09/20 1250 11/09/20 2215 11/10/20 0040 11/11/20 0111  CALCIUM 12.2* 11.6* 11.7* 12.4*   Leukocytosis -WBC 28 in ED with left shift -Had similarly elevated white count during previous admission for nausea and vomiting, possibly reactive leukocytosis -No signs of infection on chest x-ray, urinalysis or CT abdomen -We will treat supportively for now -Trend fever curve and white count. -Repeat labs tomorrow. Recent Labs  Lab 11/09/20 1250 11/09/20 2215 11/10/20 0040 11/11/20 0111  WBC 28.6* 27.2* 24.5* 23.7*  LATICACIDVEN  --  1.2  --   --    CKD 3 -Creatinine at baseline. -No labs today.  Repeat tomorrow. Recent Labs    09/26/20 0508 09/27/20 0517 09/28/20 0504 09/29/20 0452 10/10/20 1504 10/28/20 1838 11/09/20 1250 11/09/20 2215 11/10/20 0040 11/11/20 0111  BUN 25* 18 17 17 23  25* 31* 23 24* 24*  CREATININE 1.99* 1.99* 1.89* 1.74* 1.83* 1.98* 1.76* 1.58* 1.51* 1.63*   Hypokalemia/hypophosphatemia -This morning was phosphorus level low at 1.8. -Phosphorus replacement given yesterday.  Repeat tomorrow.. Recent Labs  Lab 11/09/20 1250 11/09/20 2215 11/10/20 0040 11/11/20 0111  K 3.1* 3.3* 3.4* 3.5  MG  --  2.1  --  2.2  PHOS  --  2.3*  --  1.8*   Type 2 diabetes mellitus Hyperglycemia -Last A1c 6.5 on 10/17. -On Tresiba 5 units daily at home. -Blood sugar running high because of inability to use insulin -Currently on Lantus 5 units daily along with sliding scale insulin Accu-Cheks. -Because of poor appetite, patient is at risk of hypoglycemia..   Continue to monitor. Recent Labs  Lab 11/11/20 1057 11/11/20 1605 11/11/20 2050 11/12/20 0535 11/12/20 1100  GLUCAP 172* 102* 158* 151* 158*   Anxiety and depression - Continue home bupropion, escitalopram, trazodone, as needed lorazepam  Mobility: Uses walker at home.   Code Status:   Code Status: Full Code  Nutritional status: Body mass index is 26 kg/m.     Diet Order            Diet clear liquid Room service appropriate? Yes; Fluid consistency: Thin  Diet effective now                 DVT prophylaxis: heparin injection 5,000 Units Start: 11/10/20 0600   Antimicrobials:  None Fluid: Switch fluid to D5 NS at 100/h. Family Communication: : Updated patient's daughter Ms. Rosita Kea on the phone.   Infusions:  . sodium chloride    . sodium chloride 75 mL/hr at 11/12/20 1116    Scheduled Meds: . amLODipine  10 mg Oral Daily  . aspirin EC  81 mg Oral Daily  . buPROPion  150 mg Oral Daily  . carvedilol  12.5 mg Oral BID WC  . cephALEXin  500 mg Oral Q12H  . Chlorhexidine Gluconate Cloth  6 each Topical Daily  . escitalopram  20 mg Oral Daily  . heparin  5,000 Units Subcutaneous Q8H  . hydrALAZINE  25 mg Oral QHS  . influenza vaccine adjuvanted  0.5 mL Intramuscular Tomorrow-1000  . insulin aspart  0-15 Units Subcutaneous TID WC  . insulin aspart  0-5 Units Subcutaneous QHS  . insulin glargine  5 Units Subcutaneous Daily  . metoCLOPramide  10 mg Oral TID AC & HS  . oxybutynin  10 mg Oral Daily  . pantoprazole  40 mg Oral Daily  . pneumococcal 23 valent vaccine  0.5 mL Intramuscular Tomorrow-1000  . polyethylene glycol  17 g Oral BID  . rosuvastatin  20 mg Oral Daily  . sodium chloride flush  3 mL Intravenous Q12H  . traZODone  100 mg Oral QHS    Antimicrobials: Anti-infectives (From admission, onward)   Start     Dose/Rate Route Frequency Ordered Stop   11/09/20 2215  cephALEXin (KEFLEX) capsule 500 mg        500 mg Oral Every 12 hours 11/09/20  2144        PRN meds: sodium chloride, HYDROcodone-acetaminophen, lip balm, LORazepam, ondansetron (ZOFRAN) IV, sodium chloride flush   Objective: Vitals:   11/12/20 0736 11/12/20 1059  BP: 127/79 114/75  Pulse: 61 60  Resp: 15 17  Temp: 98.3 F (36.8 C) 98.2 F (36.8 C)  SpO2: 98% 97%    Intake/Output Summary (Last 24 hours) at 11/12/2020 1416 Last data filed at 11/12/2020 4383 Gross per 24 hour  Intake 500 ml  Output 500 ml  Net 0 ml   Filed Weights   11/09/20 1121 11/09/20 2100  Weight: 74 kg 68.7 kg   Weight change:  Body mass index is 26 kg/m.   Physical Exam: General exam: Not in physical distress.  Feels weak.  Frustrated because of persistent vomiting skin: No rashes, lesions or ulcers. HEENT: Atraumatic, normocephalic, no obvious bleeding Lungs: Clear to auscultation bilaterally CVS: Regular rate and rhythm, no murmur GI/Abd soft, nontender, nondistended, bowel sound present CNS: Alert, awake, oriented x3 Psychiatry: Depressed look Extremities: No pedal edema, no calf tenderness  Data Review: I have personally reviewed the laboratory data and studies available.  Recent Labs  Lab 11/09/20 1250 11/09/20 2215 11/10/20 0040 11/11/20 0111  WBC 28.6* 27.2* 24.5* 23.7*  NEUTROABS 24.5*  --   --  19.0*  HGB 15.6* 15.0 15.4* 14.8  HCT 44.0 42.6 42.7 41.3  MCV 85.9 86.9 86.1 87.1  PLT 235 221 223 237   Recent Labs  Lab 11/09/20 1250 11/09/20 2215 11/10/20 0040 11/11/20 0111  NA 134* 137 137 138  K 3.1* 3.3* 3.4* 3.5  CL 99 104 106 105  CO2 22 21* 20* 23  GLUCOSE 243* 197* 228* 185*  BUN 31* 23 24* 24*  CREATININE 1.76* 1.58* 1.51* 1.63*  CALCIUM 12.2* 11.6* 11.7* 12.4*  MG  --  2.1  --  2.2  PHOS  --  2.3*  --  1.8*    F/u labs ordered.  Signed, Terrilee Croak, MD [facility] 11/12/2020

## 2020-11-12 NOTE — Progress Notes (Signed)
Mobility Specialist: Progress Note   11/12/20 1543  Mobility  Activity Refused mobility   Assisted pt to [facility] but pt refused mobility due to pain level, RN present.   [facility] Day Mobility Specialist

## 2020-11-12 NOTE — Progress Notes (Signed)
Progress Note  Patient Name: Melissa Lopez Date of Encounter: 11/12/2020  [facility] Cardiologist: Carlyle Dolly, MD   Subjective   BP improved, 114/75 today.  SpO2 97% on RA.  Continue to have nausea, denies any vomiting today  Inpatient Medications    Scheduled Meds: . amLODipine  10 mg Oral Daily  . aspirin EC  81 mg Oral Daily  . buPROPion  150 mg Oral Daily  . carvedilol  12.5 mg Oral BID WC  . cephALEXin  500 mg Oral Q12H  . Chlorhexidine Gluconate Cloth  6 each Topical Daily  . escitalopram  20 mg Oral Daily  . heparin  5,000 Units Subcutaneous Q8H  . hydrALAZINE  25 mg Oral QHS  . influenza vaccine adjuvanted  0.5 mL Intramuscular Tomorrow-1000  . insulin aspart  0-15 Units Subcutaneous TID WC  . insulin aspart  0-5 Units Subcutaneous QHS  . insulin glargine  5 Units Subcutaneous Daily  . metoCLOPramide  10 mg Oral TID AC & HS  . oxybutynin  10 mg Oral Daily  . pantoprazole  40 mg Oral Daily  . pneumococcal 23 valent vaccine  0.5 mL Intramuscular Tomorrow-1000  . polyethylene glycol  17 g Oral BID  . rosuvastatin  20 mg Oral Daily  . sodium chloride flush  3 mL Intravenous Q12H  . traZODone  100 mg Oral QHS   Continuous Infusions: . sodium chloride    . sodium chloride 75 mL/hr at 11/12/20 1116   PRN Meds: sodium chloride, HYDROcodone-acetaminophen, lip balm, LORazepam, ondansetron (ZOFRAN) IV, sodium chloride flush   Vital Signs    Vitals:   11/11/20 2304 11/12/20 0311 11/12/20 0736 11/12/20 1059  BP: (!) 172/91 (!) 145/85 127/79 114/75  Pulse: 62 68 61 60  Resp: 18 16 15 17   Temp: 98.7 F (37.1 C) 98.7 F (37.1 C) 98.3 F (36.8 C) 98.2 F (36.8 C)  TempSrc: Oral Oral Oral Oral  SpO2: 98% 96% 98% 97%  Weight:      Height:        Intake/Output Summary (Last 24 hours) at 11/12/2020 1344 Last data filed at 11/12/2020 7026 Gross per 24 hour  Intake 500 ml  Output 500 ml  Net 0 ml   Last 3 Weights 11/09/2020 11/09/2020 10/28/2020   Weight (lbs) 151 lb 7.3 oz 163 lb 2.3 oz 163 lb 2.3 oz  Weight (kg) 68.7 kg 74 kg 74 kg      Telemetry    NSR with occasional PVCs - Personally Reviewed  ECG    No new ecg - Personally Reviewed  Physical Exam   GEN: No acute distress.  Laying in bed. Neck: No JVD Cardiac: RRR, no murmurs, rubs, or gallops.  Respiratory: Clear to auscultation bilaterally. GI: Soft, nontender, non-distended  MS: No edema; No deformity. Right groin access site c/d/i Neuro:  Nonfocal  Psych: Normal affect   Labs    High Sensitivity Troponin:   Recent Labs  Lab 10/28/20 2031 11/09/20 1827 11/09/20 2215 11/10/20 0040 11/10/20 0426  TROPONINIHS 17 53* 65* 57* 49*      Chemistry Recent Labs  Lab 11/09/20 1250 11/09/20 1250 11/09/20 2215 11/10/20 0040 11/11/20 0111  NA 134*   < > 137 137 138  K 3.1*   < > 3.3* 3.4* 3.5  CL 99   < > 104 106 105  CO2 22   < > 21* 20* 23  GLUCOSE 243*   < > 197* 228* 185*  BUN 31*   < >  23 24* 24*  CREATININE 1.76*   < > 1.58* 1.51* 1.63*  CALCIUM 12.2*   < > 11.6* 11.7* 12.4*  PROT 7.2  --   --  6.7  --   ALBUMIN 3.8  --   --  3.5  --   AST 18  --   --  14*  --   ALT 13  --   --  13  --   ALKPHOS 99  --   --  102  --   BILITOT 0.7  --   --  0.7  --   GFRNONAA 31*   < > 35* 37* 34*  ANIONGAP 13   < > 12 11 10    < > = values in this interval not displayed.     Hematology Recent Labs  Lab 11/09/20 2215 11/10/20 0040 11/11/20 0111  WBC 27.2* 24.5* 23.7*  RBC 4.90 4.96 4.74  HGB 15.0 15.4* 14.8  HCT 42.6 42.7 41.3  MCV 86.9 86.1 87.1  MCH 30.6 31.0 31.2  MCHC 35.2 36.1* 35.8  RDW 12.5 12.6 12.7  PLT 221 223 237    BNPNo results for input(s): BNP, PROBNP in the last 168 hours.   DDimer No results for input(s): DDIMER in the last 168 hours.   Radiology    No results found.  Cardiac Studies   LHC 11/09/20:  1st Mrg lesion is 100% stenosed.  Ost Ramus lesion is 65% stenosed.  Ost LAD to Prox LAD lesion is 15% stenosed with  60% stenosed side branch in Ost 1st Diag.  Ost Cx to Prox Cx lesion is 45% stenosed.  Diffuse mild CAD and very tortuous vessels. No true occlusive lesions. None   SUMMARY  Stable nonocclusive mild to moderate disease with no culprit lesion for ST elevation MI. ->  The true diagnosis is no longer STEMI -> very similar findings with that very similar presentation 2 years ago.  EF not assessed due to plan to conserve contrast.  Relatively low LVEDP-IV given 500 mL bolus.   PLAN  Due to bed availability, she will be admitted to CT ICU, but could be transferred if necessary  We will consult [place] medicine service to assist with her severely elevated white blood cell count, already on antibiotics for possible UTI as well as profound nausea vomiting and hyperglycemia. ->  Concern for possible HONK (hyperosmotic nonketotic ketoacidosis     TTE 09/14/20: IMPRESSIONS 1. The endocardium in the apex is poorly visualized, cannot exclude  possible apical hypokinesis and/or apical hypertrophy, consider contrast  study. . Left ventricular ejection fraction, by estimation, is 55 to 60%.  The left ventricle has normal  function. Left ventricular endocardial border not optimally defined to  evaluate regional wall motion. There is moderate left ventricular  hypertrophy. Left ventricular diastolic parameters are consistent with  Grade I diastolic dysfunction (impaired  relaxation).  2. Right ventricular systolic function is normal. The right ventricular  size is normal.  3. The mitral valve is normal in structure. No evidence of mitral valve  regurgitation. No evidence of mitral stenosis.  4. The aortic valve is tricuspid. Aortic valve regurgitation is not  visualized. No aortic stenosis is present.  5. The inferior vena cava is normal in size with greater than 50%  respiratory variability, suggesting right atrial pressure of 3 mmHg.   Comparison(s): Echocardiogram done 11/01/18 showed  and EF of 55-60%.   LHC 29/93/7169:  LV end diastolic pressure is mildly elevated.  Ost Ramus lesion  is 70% stenosed -relatively small caliber vessel. Not likely because of resting chest pain and ST elevations.  Ost 1st Diag lesion is 60% stenosed. -Not likely flow-limiting.  Very small caliber (<77mm) 1st Mrg lesion appears to be 100% stenosed with faint collateralization. Too small for angioplasty or stenting.   SUMMARY:  Angiographically mild diffuse disease, only potentially occluded vessels are very very small branch of the major obtuse marginal.  Not likely a culprit lesion for significant anginal chest pain.  Mild to moderately elevated LVEDP.  LV gram not performed due to renal insufficiency.  RECOMMENDATIONS:  Based on presentation, she will be admitted to the ICU but would have her stepdown status.  Since were not clear of the etiology of her chest pain, will restart IV heparin 8 hours after sheath removal until we are able to exclude PE.  Also for the possible small branch occlusion.  Check 2D echocardiogram to exclude pericardial effusion with possible pericarditis as etiology.  Also need to evaluate her cause for syncope.  She did not have chest pain with syncope and denies agree with this chest pain so they probably are not related.  ? Check 2D echocardiogram ? Interrogate pacemaker ? Monitor for arrhythmias on telemetry  Recommend Aspirin 81mg  daily for moderate CAD.  With renal insufficiency, will need to consider having internal medicine/nephrology follow especially with her diabetes. -Quite likely multiple noncardiac issues.         Patient Profile     69 y.o. female with history of moderate CAD (cath in 2019 mild diffuse disease), symptomatic bradycardia s/p [employer] PPM, CKD stage III, HTN, type II DM with nephropathy who presented to the ER with nausea, vomiting and diarrhea found to have STE in V3-V6 as well as inferior leads with concern for  STEMI. Emergent cath performed which revealed stable moderate nonobstructive CAD without evidence of a culprit lesions. She was transferred to ICU due to bed shortage for continued medical management.  Assessment & Plan    #Moderate, nonobstructive CAD #Chest Pain: Patient presented with chest pain with STE in V3-V6 as well as inferior leads taken emergently to cath lab which revealed moderate, non-obstructive disease without culprit or lesions that merit intervention. Findings similar to previous cath in 2019. Now pursuing continued medical therapy.  Echo 12/3 showed normal biventricular function, no significant valvular disease. -Cath with mild-to-moderate nonobstructive disease with no lesions that merit intervention -Continue ASA 81mg  daily -Continue rosuvastatin 20mg  daily -Continue coreg 12.5mg  BID  -Have held lisinopril, can resume if stable Cr  #Chronic Diastolic Heart Failure: Filling pressures low on cath. Appears compensated. NYHA class II symptoms. -Continue carvedilol 12.5mg  BID -Resume home zestril  pending stability in renal function -Echo yesterday showed normal biventricular function, grade 1 diastolic dysfunction, no significant valvular disease.  #Hypertension: Improved. Has history of hypotension/dizziness with medication titration in the past. Increased coreg and continue to monitor. 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 Reactive in the setting of nausea/vomiting vs infectious etiology.  -IM consulted, appreciate recommendations -Follow-up culture data  #CKDIII Cr at baseline.  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 -Management per internal  medicine -Continue zofran and reglan  #DMII: -Management per IM   For questions or updates, please contact [place]  Please consult [email or web address] for contact info under        Signed, Donato Heinz, MD  11/12/2020, 1:44 PM

## 2020-11-13 DIAGNOSIS — I10 Essential (primary) hypertension: Secondary | ICD-10-CM

## 2020-11-13 DIAGNOSIS — R079 Chest pain, unspecified: Secondary | ICD-10-CM | POA: Diagnosis not present

## 2020-11-13 LAB — CBC WITH DIFFERENTIAL/PLATELET
Abs Immature Granulocytes: 0.08 10*3/uL — ABNORMAL HIGH (ref 0.00–0.07)
Basophils Absolute: 0 10*3/uL (ref 0.0–0.1)
Basophils Relative: 0 %
Eosinophils Absolute: 0.1 10*3/uL (ref 0.0–0.5)
Eosinophils Relative: 1 %
HCT: 40 % (ref 36.0–46.0)
Hemoglobin: 13.8 g/dL (ref 12.0–15.0)
Immature Granulocytes: 1 %
Lymphocytes Relative: 23 %
Lymphs Abs: 3.2 10*3/uL (ref 0.7–4.0)
MCH: 30.3 pg (ref 26.0–34.0)
MCHC: 34.5 g/dL (ref 30.0–36.0)
MCV: 87.7 fL (ref 80.0–100.0)
Monocytes Absolute: 1.1 10*3/uL — ABNORMAL HIGH (ref 0.1–1.0)
Monocytes Relative: 8 %
Neutro Abs: 9.4 10*3/uL — ABNORMAL HIGH (ref 1.7–7.7)
Neutrophils Relative %: 67 %
Platelets: 206 10*3/uL (ref 150–400)
RBC: 4.56 MIL/uL (ref 3.87–5.11)
RDW: 12 % (ref 11.5–15.5)
WBC: 13.9 10*3/uL — ABNORMAL HIGH (ref 4.0–10.5)
nRBC: 0 % (ref 0.0–0.2)

## 2020-11-13 LAB — GLUCOSE, CAPILLARY
Glucose-Capillary: 159 mg/dL — ABNORMAL HIGH (ref 70–99)
Glucose-Capillary: 165 mg/dL — ABNORMAL HIGH (ref 70–99)
Glucose-Capillary: 133 mg/dL — ABNORMAL HIGH (ref 70–99)
Glucose-Capillary: 183 mg/dL — ABNORMAL HIGH (ref 70–99)

## 2020-11-13 LAB — BASIC METABOLIC PANEL
Anion gap: 9 (ref 5–15)
BUN: 19 mg/dL (ref 8–23)
CO2: 23 mmol/L (ref 22–32)
Calcium: 11 mg/dL — ABNORMAL HIGH (ref 8.9–10.3)
Chloride: 105 mmol/L (ref 98–111)
Creatinine, Ser: 1.71 mg/dL — ABNORMAL HIGH (ref 0.44–1.00)
GFR, Estimated: 32 mL/min — ABNORMAL LOW (ref >60)
Glucose, Bld: 167 mg/dL — ABNORMAL HIGH (ref 70–99)
Potassium: 3.8 mmol/L (ref 3.5–5.1)
Sodium: 137 mmol/L (ref 135–145)

## 2020-11-13 LAB — MAGNESIUM: Magnesium: 2 mg/dL (ref 1.7–2.4)

## 2020-11-13 LAB — PHOSPHORUS: Phosphorus: 2.2 mg/dL — ABNORMAL LOW (ref 2.5–4.6)

## 2020-11-13 MED ORDER — LISINOPRIL 10 MG PO TABS
20.0000 mg | ORAL_TABLET | Freq: Every day | ORAL | Status: DC
  Administered 2020-11-13 – 2020-11-16 (×4): 20 mg via ORAL
  Filled 2020-11-13 (×5): qty 2

## 2020-11-13 MED ORDER — METOCLOPRAMIDE HCL 5 MG/ML IJ SOLN
10.0000 mg | Freq: Three times a day (TID) | INTRAMUSCULAR | Status: DC | PRN
  Administered 2020-11-14 (×3): 10 mg via INTRAVENOUS
  Filled 2020-11-13 (×3): qty 2

## 2020-11-13 MED ORDER — K PHOS MONO-SOD PHOS DI & MONO 155-852-130 MG PO TABS
500.0000 mg | ORAL_TABLET | Freq: Once | ORAL | Status: AC
  Administered 2020-11-13: 500 mg via ORAL
  Filled 2020-11-13: qty 2

## 2020-11-13 NOTE — Care Management Important Message (Signed)
Important Message  Patient Details  Name: Melissa Lopez MRN: [id number] Date of Birth: Nov 27, 1951   Medicare Important Message Given:  Yes     Shelda Altes 11/13/2020, 10:23 AM

## 2020-11-13 NOTE — Progress Notes (Signed)
PROGRESS NOTE  Melissa Lopez  DOB: [birth year]  PCP: Celene Squibb, MD WSF:[id number]  DOA: 11/09/2020  LOS: 4 days   Chief Complaint  Patient presents with  . Emesis   Brief narrative: Melissa Lopez is a 69 y.o. 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 On 12/2, patient presented to the ED at [facility] with complaint of 2 weeks history of worsening nausea, vomiting and diarrhea associated with decreased oral intake.  Patient was admitted in October with similar symptoms with no clear etiology of her intractable nausea and vomiting.  She had multiple negative CT scans.  EGD showed bile reflux suggestive of gastric dysmotility versus reflux gastritis.  Her symptoms resolved and she was discharged.  She did not tolerate gastric emptying studies. On 11/20, patient returned back to the ED with similar symptoms and left lower quadrant pain.  She was noted to have UTI, started on Keflex and discharged to home. However her symptoms persisted and hence patient presented to the ED again on 12/2.  Marijuana use was also suspected to be one of the possible causes of her symptoms.  Patient states that she has not smoked marijuana for last 1 month.  In the ED, patient's blood pressure was elevated to 140s, otherwise stable. Labs with sodium 134, potassium 3.1, creatinine up at 1.76, WC count elevated 28.6 CT scan of abdomen did not show any acute findings. Initial troponin was negative.  However her EKG was concerning for ST elevation in multiple leads. Patient was transported emergently to: Cath Lab and underwent cardiac cath. Hospitalist service was consulted for comanagement of medical issues.  Subjective: Patient was seen and examined this morning.  Lying on bed.  Continues to have nausea.  Threw up again this  morning.  Does not have good appetite.  Physically less active as well.  Assessment/Plan: Acute chest pain with ST elevation in multiple EKG leads History of abnormal EKG and nonobstructive CAD -Apparently patient has abnormal EKG findings with ST elevation in the past as well.  She underwent cardiac cath in 2019 and had nonobstructive CAD. -This admission, with a presenting symptoms of chest pain and persistent ST elevation on EKG, she underwent cardiac cath again.  She was noted to have distal vessel stenosis, not flow-limiting and not amenable for angioplasty. -Continue Coreg and aspirin.  Intractable nausea and vomiting Chronic nausea and vomiting -Extensive work-up in October found source likely due to bile reflux gastritis versus diabetic gastroparesis. Has quit THC since that time. -Has been unable to tolerate gastric emptying studies in the past per her report.  I ordered for another one today. -Previous GI evaluation stated patient may not benefit from Reglan long-term unless she has symptoms without it and results with it -Currently on Reglan trial with 10 mg 3 times daily scheduled -Continue as needed Zofran and supportive care. -On D5 NS at 100 mill per hour.  Continue the same.   -Encourage oral intake.  Currently on clear liquid diet but she does not feel like eating. -We will try to obtain gastric emptying study tomorrow.  Chronic diastolic CHF Essential hypertension  -Echo from October 2021 with EF 55 to 60% and grade 1 diastolic dysfunction. -Home medications include Coreg 6.25 mg twice daily, amlodipine 10 mg daily, hydralazine 25 mg at bedtime, lisinopril 20 mg daily -Continue all.  Coreg dose was  increased to 12.5 mg daily. -Continue to monitor blood pressure and heart for symptoms. -As needed labetalol.  History of symptomatic bradycardia status post pacemaker - Per Cardiology   Chronic hypercalcemia Primary hyperparathyroidism -Calcium 12.2 on presentation.   Initially improved with IV fluid, it is going up again.  Likely secondary to dehydration.  I will continue IV hydration. -Patient's daughter reports elevated calcium level for last 20 years.  She apparently has a known nodule for work-up in [place].  However parathyroid scan in October 2021 was negative. Recent Labs  Lab 11/09/20 2215 11/10/20 0040 11/11/20 0111 11/12/20 1436 11/13/20 0328  CALCIUM 11.6* 11.7* 12.4* 10.5* 11.0*   Leukocytosis -On arrival, patient had WBC 28 with left shift -Had similarly elevated white count during previous admission for nausea and vomiting, possibly reactive leukocytosis -No signs of infection on chest x-ray, urinalysis or CT abdomen -We will treat supportively for now -Trend fever curve and white count. -Repeat labs tomorrow. Recent Labs  Lab 11/09/20 1250 11/09/20 2215 11/10/20 0040 11/11/20 0111 11/13/20 0328  WBC 28.6* 27.2* 24.5* 23.7* 13.9*  LATICACIDVEN  --  1.2  --   --   --    CKD 3b -Creatinine at baseline. Recent Labs    09/28/20 0504 09/29/20 0452 10/10/20 1504 10/28/20 1838 11/09/20 1250 11/09/20 2215 11/10/20 0040 11/11/20 0111 11/12/20 1436 11/13/20 0328  BUN 17 17 23  25* 31* 23 24* 24* 24* 19  CREATININE 1.89* 1.74* 1.83* 1.98* 1.76* 1.58* 1.51* 1.63* 1.51* 1.71*   Hypokalemia/hypophosphatemia -Phosphorus level remains low at 2.2 this morning.  Oral potassium phosphate replacement ordered. Recent Labs  Lab 11/09/20 2215 11/10/20 0040 11/11/20 0111 11/12/20 1436 11/13/20 0328  K 3.3* 3.4* 3.5 3.3* 3.8  MG 2.1  --  2.2  --  2.0  PHOS 2.3*  --  1.8*  --  2.2*   Type 2 diabetes mellitus Hyperglycemia -Last A1c 6.5 on 10/17. -On Tresiba 5 units daily at home. -Blood sugar running high because of inability to use insulin -Currently on Lantus 5 units daily along with sliding scale insulin Accu-Cheks. -Because of poor appetite, patient is at risk of hypoglycemia..  Continue to monitor. Recent Labs  Lab  11/12/20 1100 11/12/20 1605 11/12/20 2042 11/13/20 0544 11/13/20 1116  GLUCAP 158* 138* 170* 159* 165*   Anxiety and depression - Continue home bupropion, escitalopram, trazodone, as needed lorazepam  Mobility: Uses walker at home.   Code Status:   Code Status: Full Code  Nutritional status: Body mass index is 26 kg/m.     Diet Order    None      DVT prophylaxis: heparin injection 5,000 Units Start: 11/10/20 0600   Antimicrobials:  None Fluid: Continue D5 NS at 100/h. Family Communication: : Updated patient's daughter Ms. Melissa Lopez on the phone.   Infusions:  . sodium chloride    . dextrose 5 % and 0.9% NaCl 100 mL/hr at 11/13/20 0045    Scheduled Meds: . amLODipine  10 mg Oral Daily  . aspirin EC  81 mg Oral Daily  . buPROPion  150 mg Oral Daily  . carvedilol  12.5 mg Oral BID WC  . cephALEXin  500 mg Oral Q12H  . Chlorhexidine Gluconate Cloth  6 each Topical Daily  . escitalopram  20 mg Oral Daily  . heparin  5,000 Units Subcutaneous Q8H  . hydrALAZINE  25 mg Oral QHS  . influenza vaccine adjuvanted  0.5 mL Intramuscular Tomorrow-1000  . insulin aspart  0-15 Units  Subcutaneous TID WC  . insulin aspart  0-5 Units Subcutaneous QHS  . insulin glargine  5 Units Subcutaneous Daily  . metoCLOPramide  10 mg Oral TID AC & HS  . oxybutynin  10 mg Oral Daily  . pantoprazole  40 mg Oral Daily  . pneumococcal 23 valent vaccine  0.5 mL Intramuscular Tomorrow-1000  . polyethylene glycol  17 g Oral BID  . rosuvastatin  20 mg Oral Daily  . sodium chloride flush  3 mL Intravenous Q12H  . traZODone  100 mg Oral QHS    Antimicrobials: Anti-infectives (From admission, onward)   Start     Dose/Rate Route Frequency Ordered Stop   11/09/20 2215  cephALEXin (KEFLEX) capsule 500 mg        500 mg Oral Every 12 hours 11/09/20 2144        PRN meds: sodium chloride, HYDROcodone-acetaminophen, lip balm, LORazepam, ondansetron (ZOFRAN) IV, sodium chloride flush    Objective: Vitals:   11/13/20 0700 11/13/20 1114  BP: (!) 161/90 133/80  Pulse: 62 61  Resp: 17 19  Temp: 98.2 F (36.8 C) 98 F (36.7 C)  SpO2: 99% 99%    Intake/Output Summary (Last 24 hours) at 11/13/2020 1336 Last data filed at 11/13/2020 0559 Gross per 24 hour  Intake 120 ml  Output 750 ml  Net -630 ml   Filed Weights   11/09/20 1121 11/09/20 2100  Weight: 74 kg 68.7 kg   Weight change:  Body mass index is 26 kg/m.   Physical Exam: General exam: Not in physical distress.  Feels physically weak skin: No rashes, lesions or ulcers. HEENT: Atraumatic, normocephalic, no obvious bleeding Lungs: Clear to auscultation bilaterally CVS: Regular rate and rhythm, no murmur GI/Abd soft, mild epigastric tenderness present, nondistended, bowel sound present CNS: Alert, awake, oriented x3 Psychiatry: Depressed look Extremities: No pedal edema, no calf tenderness  Data Review: I have personally reviewed the laboratory data and studies available.  Recent Labs  Lab 11/09/20 1250 11/09/20 2215 11/10/20 0040 11/11/20 0111 11/13/20 0328  WBC 28.6* 27.2* 24.5* 23.7* 13.9*  NEUTROABS 24.5*  --   --  19.0* 9.4*  HGB 15.6* 15.0 15.4* 14.8 13.8  HCT 44.0 42.6 42.7 41.3 40.0  MCV 85.9 86.9 86.1 87.1 87.7  PLT 235 221 223 237 206   Recent Labs  Lab 11/09/20 2215 11/10/20 0040 11/11/20 0111 11/12/20 1436 11/13/20 0328  NA 137 137 138 134* 137  K 3.3* 3.4* 3.5 3.3* 3.8  CL 104 106 105 105 105  CO2 21* 20* 23 21* 23  GLUCOSE 197* 228* 185* 132* 167*  BUN 23 24* 24* 24* 19  CREATININE 1.58* 1.51* 1.63* 1.51* 1.71*  CALCIUM 11.6* 11.7* 12.4* 10.5* 11.0*  MG 2.1  --  2.2  --  2.0  PHOS 2.3*  --  1.8*  --  2.2*    F/u labs ordered.  Signed, Terrilee Croak, MD [facility] 11/13/2020

## 2020-11-13 NOTE — Progress Notes (Signed)
Mobility Specialist - Progress Note   11/13/20 1058  Mobility  Activity Refused mobility   Refused ambulation due to weakness despite importance of ambulating daily being explained to the pt.   Pricilla Handler Mobility Specialist Mobility Specialist Phone: [phone]

## 2020-11-13 NOTE — Progress Notes (Addendum)
Progress Note  Patient Name: Melissa Lopez Date of Encounter: 11/13/2020  Primary Cardiologist: Carlyle Dolly, MD  Subjective   Continues to note nausea, abdominal pain. Not as bad as yesterday. Notes outline little motivation to get OOB when asked to ambulate. She reports she just feels very tired. No CP, SOB or problems at cath site. She says gastric emptying study was postponed until tomorrow.  Inpatient Medications    Scheduled Meds: . amLODipine  10 mg Oral Daily  . aspirin EC  81 mg Oral Daily  . buPROPion  150 mg Oral Daily  . carvedilol  12.5 mg Oral BID WC  . cephALEXin  500 mg Oral Q12H  . Chlorhexidine Gluconate Cloth  6 each Topical Daily  . escitalopram  20 mg Oral Daily  . heparin  5,000 Units Subcutaneous Q8H  . hydrALAZINE  25 mg Oral QHS  . influenza vaccine adjuvanted  0.5 mL Intramuscular Tomorrow-1000  . insulin aspart  0-15 Units Subcutaneous TID WC  . insulin aspart  0-5 Units Subcutaneous QHS  . insulin glargine  5 Units Subcutaneous Daily  . metoCLOPramide  10 mg Oral TID AC & HS  . oxybutynin  10 mg Oral Daily  . pantoprazole  40 mg Oral Daily  . pneumococcal 23 valent vaccine  0.5 mL Intramuscular Tomorrow-1000  . polyethylene glycol  17 g Oral BID  . rosuvastatin  20 mg Oral Daily  . sodium chloride flush  3 mL Intravenous Q12H  . traZODone  100 mg Oral QHS   Continuous Infusions: . sodium chloride    . dextrose 5 % and 0.9% NaCl 100 mL/hr at 11/13/20 0045   PRN Meds: sodium chloride, HYDROcodone-acetaminophen, lip balm, LORazepam, ondansetron (ZOFRAN) IV, sodium chloride flush   Vital Signs    Vitals:   11/12/20 2317 11/13/20 0352 11/13/20 0700 11/13/20 1114  BP: (!) 166/77 (!) 167/97 (!) 161/90 133/80  Pulse: (!) 59 60 62 61  Resp: 18  17 19   Temp: 98.7 F (37.1 C) 97.7 F (36.5 C) 98.2 F (36.8 C) 98 F (36.7 C)  TempSrc: Oral Oral Oral Oral  SpO2: 98% 100% 99% 99%  Weight:      Height:        Intake/Output Summary  (Last 24 hours) at 11/13/2020 1125 Last data filed at 11/13/2020 0559 Gross per 24 hour  Intake 120 ml  Output 750 ml  Net -630 ml   Last 3 Weights 11/09/2020 11/09/2020 10/28/2020  Weight (lbs) 151 lb 7.3 oz 163 lb 2.3 oz 163 lb 2.3 oz  Weight (kg) 68.7 kg 74 kg 74 kg     Telemetry    Atrial paced, v sensed primarily, occasional PVC - Personally Reviewed   Physical Exam   GEN: No acute distress, fatigued appearing HEENT: Normocephalic, atraumatic, sclera non-icteric. Neck: No JVD or bruits. Cardiac: RRR no murmurs, rubs, or gallops.  Radials/DP/PT 1+ and equal bilaterally.  Respiratory: Clear to auscultation bilaterally. Breathing is unlabored. GI: Soft, nontender, non-distended, BS +x 4. MS: no deformity. Extremities: No clubbing or cyanosis. No edema. Distal pedal pulses are 2+ and equal bilaterally. Right groin cath site without hematoma or bruit. Minor ecchymosis noted Neuro:  AAOx3. Follows commands. Psych:  Responds to questions appropriately with a normal affect.  Labs    High Sensitivity Troponin:   Recent Labs  Lab 10/28/20 2031 11/09/20 1827 11/09/20 2215 11/10/20 0040 11/10/20 0426  TROPONINIHS 17 53* 65* 57* 49*      Cardiac EnzymesNo results  for input(s): TROPONINI in the last 168 hours. No results for input(s): TROPIPOC in the last 168 hours.   Chemistry Recent Labs  Lab 11/09/20 1250 11/09/20 2215 11/10/20 0040 11/10/20 0040 11/11/20 0111 11/12/20 1436 11/13/20 0328  NA 134*   < > 137   < > 138 134* 137  K 3.1*   < > 3.4*   < > 3.5 3.3* 3.8  CL 99   < > 106   < > 105 105 105  CO2 22   < > 20*   < > 23 21* 23  GLUCOSE 243*   < > 228*   < > 185* 132* 167*  BUN 31*   < > 24*   < > 24* 24* 19  CREATININE 1.76*   < > 1.51*   < > 1.63* 1.51* 1.71*  CALCIUM 12.2*   < > 11.7*   < > 12.4* 10.5* 11.0*  PROT 7.2  --  6.7  --   --   --   --   ALBUMIN 3.8  --  3.5  --   --   --   --   AST 18  --  14*  --   --   --   --   ALT 13  --  13  --   --   --    --   ALKPHOS 99  --  102  --   --   --   --   BILITOT 0.7  --  0.7  --   --   --   --   GFRNONAA 31*   < > 37*   < > 34* 37* 32*  ANIONGAP 13   < > 11   < > 10 8 9    < > = values in this interval not displayed.     Hematology Recent Labs  Lab 11/10/20 0040 11/11/20 0111 11/13/20 0328  WBC 24.5* 23.7* 13.9*  RBC 4.96 4.74 4.56  HGB 15.4* 14.8 13.8  HCT 42.7 41.3 40.0  MCV 86.1 87.1 87.7  MCH 31.0 31.2 30.3  MCHC 36.1* 35.8 34.5  RDW 12.6 12.7 12.0  PLT 223 237 206    BNPNo results for input(s): BNP, PROBNP in the last 168 hours.   DDimer No results for input(s): DDIMER in the last 168 hours.   Radiology    No results found.  Cardiac Studies   2D Echo 11/10/20 1. Left ventricular ejection fraction, by estimation, is 60 to 65%. The  left ventricle has normal function. The left ventricle has no regional  wall motion abnormalities. Left ventricular diastolic parameters are  consistent with Grade I diastolic  dysfunction (impaired relaxation).  2. Right ventricular systolic function is normal. The right ventricular  size is normal. There is normal pulmonary artery systolic pressure.  3. The mitral valve was not well visualized. No evidence of mitral valve  regurgitation. No evidence of mitral stenosis.  4. There is mild thickening of the aortic valve. Aortic valve  regurgitation is not visualized. No aortic stenosis is present.  5. Aortic dilatation noted. There is moderate dilatation of the ascending  aorta, measuring 41 mm.   Cath 11/09/20  1st Mrg lesion is 100% stenosed.  Ost Ramus lesion is 65% stenosed.  Ost LAD to Prox LAD lesion is 15% stenosed with 60% stenosed side branch in Ost 1st Diag.  Ost Cx to Prox Cx lesion is 45% stenosed.  Diffuse mild CAD and very tortuous vessels. No  true occlusive lesions. None   SUMMARY  Stable nonocclusive mild to moderate disease with no culprit lesion for ST elevation MI. ->  The true diagnosis is no longer STEMI  -> very similar findings with that very similar presentation 2 years ago.  EF not assessed due to plan to conserve contrast.  Relatively low LVEDP-IV given 500 mL bolus.   PLAN  Due to bed availability, she will be admitted to CT ICU, but could be transferred if necessary  We will consult [facility] medicine service to assist with her severely elevated white blood cell count, already on antibiotics for possible UTI as well as profound nausea vomiting and hyperglycemia. ->  Concern for possible HONK (hyperosmotic nonketotic ketoacidosis  Glenetta Hew, MD   Patient Profile     69 y.o. female with moderate CAD (nonobstructive in 2019), symptomatic bradycardia with St. Jude PPM, CKD stage III, HTN, DM type 2 with nephropathy, prior n/v felt due to diabetic gastroparesis and dysmotility or bile relfux gastritis, hypercalcemia, hypokalemia, hypophosphatemia, primary hyperparathyroidism, chronic diastolic CHF. She presented to the ER with nausea, vomiting and diarrhea found to have STE in V3-V6 as well as inferior leads with concern for STEMI. Emergent cath performed which revealed stable moderate nonobstructive CAD without evidence of a culprit lesions. She was transferred to ICU due to bed shortage for continued medical management.  Assessment & Plan    1. Moderate nonobstructive CAD - presented with concern for ST elevation and low-level troponin elevation - cath as outlined above showing moderate, non-obstructive disease without culprit or lesions that merit intervention. Findings similar to previous cath in 2019. Recommended to continue continued medical therapy.  Echo 12/3 showed normal biventricular function, no significant valvular disease - continue ASA, statin, BB - crestor titrated and will need OP labs arranged at follow-up  2. Leukocytosis, intractable nausea, vomiting, diarrhea  - appreciate IM management  - gastric emptying study planned - treating supportively at present time -  on abx still from admission, appreciate IM input on this - at this point remains crux of issues  3. Chronic diastolic CHF - filling pressures low on cath, appears compensated - echo stable - continue BB - no current need for standing diuretic  4. Essential HTN - has history of hypotension/dizziness with medication titration in the past - carvedilol increased 12/3 this admission which we will continue - continue amlodipine 10mg  daily - continue hydralazine 25mg  QHS (home dose) - see below re: lisinopril - BP 133/80 this am  5. CKD stage III - baseline Cr appears variable from 1.5-2 range recently, 1.51 yesterday -> 1.71 today - will discuss resumption of home lisinopril with Dr. Acie Fredrickson   6. SSS s/p St. Jude PPM - stable, f/u as OP  7. Electrolyte disturbances with hypokalemia, hypophosphatemia, hypercalcemia - appreciate IM assistance  8. Dilation of ascending aorta - can consider OP imaging and follow-up  Tentatively scheduled f/u 12/21 and placed on AVS. Cardiac issues remain stable; ongoing w/u in progress for GI issues. Will see if IM is willing to take on their service.  For questions or updates, please contact [place] Please consult [email or web address] for contact info under Cardiology/STEMI.  Signed, Charlie Pitter, PA-C 11/13/2020, 11:25 AM    Attending Note:   The patient was seen and examined.  Agree with assessment and plan as noted above.  Changes made to the above note as needed.  Patient seen and independently examined with .   We discussed all aspects of the encounter.  I agree with the assessment and plan as stated above.  Troponin elevation: Had a heart catheterization several days ago which revealed nonobstructive coronary artery disease.  Continue medical therapy.  2.  Chronic diastolic congestive heart failure.  She had low filling pressures at the time of cath.  She appears to be well compensated.  3.  Hypertension: Blood pressure seems to be mildly  elevated.  We will restart her lisinopril.  Her creatinine appears to be stable.  4.  Mild aortic dilatation: She will follow up with Dr. Harl Bowie.   I have spent a total of 40 minutes with patient reviewing hospital  notes , telemetry, EKGs, labs and examining patient as well as establishing an assessment and plan that was discussed with the patient. > 50% of time was spent in direct patient care.  [facility] will sign off.   Medication Recommendations:  Cont current meds.   Titrate BP meds as needed.  Other recommendations (labs, testing, etc):   Follow up as an outpatient:  With Dr Harl Bowie    Thayer Headings, Brooke Bonito., MD, [facility] 11/13/2020, 3:06 PM [address],  [place] Pager [phone]

## 2020-11-14 ENCOUNTER — Inpatient Hospital Stay (HOSPITAL_COMMUNITY): Payer: Medicare Other

## 2020-11-14 LAB — CBC WITH DIFFERENTIAL/PLATELET
Abs Immature Granulocytes: 0.07 10*3/uL (ref 0.00–0.07)
Basophils Absolute: 0 10*3/uL (ref 0.0–0.1)
Basophils Relative: 0 %
Eosinophils Absolute: 0.2 10*3/uL (ref 0.0–0.5)
Eosinophils Relative: 1 %
HCT: 40.9 % (ref 36.0–46.0)
Hemoglobin: 14.8 g/dL (ref 12.0–15.0)
Immature Granulocytes: 1 %
Lymphocytes Relative: 27 %
Lymphs Abs: 3.5 10*3/uL (ref 0.7–4.0)
MCH: 31.1 pg (ref 26.0–34.0)
MCHC: 36.2 g/dL — ABNORMAL HIGH (ref 30.0–36.0)
MCV: 85.9 fL (ref 80.0–100.0)
Monocytes Absolute: 1.1 10*3/uL — ABNORMAL HIGH (ref 0.1–1.0)
Monocytes Relative: 9 %
Neutro Abs: 8 10*3/uL — ABNORMAL HIGH (ref 1.7–7.7)
Neutrophils Relative %: 62 %
Platelets: 200 10*3/uL (ref 150–400)
RBC: 4.76 MIL/uL (ref 3.87–5.11)
RDW: 12.1 % (ref 11.5–15.5)
WBC: 12.9 10*3/uL — ABNORMAL HIGH (ref 4.0–10.5)
nRBC: 0 % (ref 0.0–0.2)

## 2020-11-14 LAB — BASIC METABOLIC PANEL
Anion gap: 8 (ref 5–15)
BUN: 16 mg/dL (ref 8–23)
CO2: 22 mmol/L (ref 22–32)
Calcium: 10.9 mg/dL — ABNORMAL HIGH (ref 8.9–10.3)
Chloride: 107 mmol/L (ref 98–111)
Creatinine, Ser: 1.54 mg/dL — ABNORMAL HIGH (ref 0.44–1.00)
GFR, Estimated: 36 mL/min — ABNORMAL LOW (ref >60)
Glucose, Bld: 176 mg/dL — ABNORMAL HIGH (ref 70–99)
Potassium: 3.2 mmol/L — ABNORMAL LOW (ref 3.5–5.1)
Sodium: 137 mmol/L (ref 135–145)

## 2020-11-14 LAB — GLUCOSE, CAPILLARY
Glucose-Capillary: 166 mg/dL — ABNORMAL HIGH (ref 70–99)
Glucose-Capillary: 152 mg/dL — ABNORMAL HIGH (ref 70–99)
Glucose-Capillary: 141 mg/dL — ABNORMAL HIGH (ref 70–99)
Glucose-Capillary: 113 mg/dL — ABNORMAL HIGH (ref 70–99)
Glucose-Capillary: 119 mg/dL — ABNORMAL HIGH (ref 70–99)

## 2020-11-14 MED ORDER — POTASSIUM CHLORIDE CRYS ER 20 MEQ PO TBCR
40.0000 meq | EXTENDED_RELEASE_TABLET | Freq: Once | ORAL | Status: AC
  Administered 2020-11-14: 40 meq via ORAL
  Filled 2020-11-14: qty 2

## 2020-11-14 MED ORDER — SODIUM CHLORIDE 0.9 % IV SOLN: 250.0000 mg | Freq: Three times a day (TID) | INTRAVENOUS | Status: DC

## 2020-11-14 MED ORDER — SODIUM CHLORIDE 0.9 % IV SOLN
3.0000 mg/kg | Freq: Three times a day (TID) | INTRAVENOUS | Status: DC
  Administered 2020-11-14 – 2020-11-16 (×5): 205 mg via INTRAVENOUS
  Filled 2020-11-14 (×9): qty 4.1

## 2020-11-14 NOTE — Progress Notes (Signed)
Mobility Specialist - Progress Note   11/14/20 1413  Mobility  Activity Refused mobility   Refused mobility due to weakness. She expressed understanding that her weakness may worsen the longer she doesn't ambulate, but still refused.   Pricilla Handler Mobility Specialist Mobility Specialist Phone: [phone]

## 2020-11-14 NOTE — Progress Notes (Addendum)
PROGRESS NOTE  Melissa Lopez  DOB: [birth year]  PCP: Celene Squibb, MD NGE:[id number]  DOA: 11/09/2020  LOS: 5 days   Chief Complaint  Patient presents with  . Emesis   Brief narrative: Melissa Lopez is a 69 y.o. 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 On 12/2, patient presented to the ED at [facility] with complaint of 2 weeks history of worsening nausea, vomiting and diarrhea associated with decreased oral intake.  Patient was admitted in October with similar symptoms with no clear etiology of her intractable nausea and vomiting.  She had multiple negative CT scans.  EGD showed bile reflux suggestive of gastric dysmotility versus reflux gastritis.  Her symptoms resolved and she was discharged.  She did not tolerate gastric emptying studies.  On 11/20, patient returned back to the ED with similar symptoms and left lower quadrant pain.  She was noted to have UTI, started on Keflex and discharged to home. However her symptoms persisted and hence patient presented to the ED again on 12/2.  Marijuana use was also suspected to be one of the possible causes of her symptoms.  Patient states that she has not smoked marijuana for last 1 month.  In the ED, patient's blood pressure was elevated to 140s, otherwise stable. CT scan of abdomen did not show any acute findings. Initial troponin was negative.  However her EKG was concerning for ST elevation in multiple leads. Patient was transported emergently to: Cath Lab and underwent cardiac cath. Hospitalist service was consulted for comanagement of medical issues. Cardiac cath was unremarkable and hence patient was transferred to hospitalist service as the primary team. Despite continued use of scheduled Reglan, and as needed IV Zofran, patient continues to have  significant nausea, vomiting.  Subjective: Patient was seen and examined this morning.  Lying down in bed.  Continues to have nausea.  She wanted to try regular diet today but threw up 3 times this morning.  Assessment/Plan: Acute chest pain with ST elevation in multiple EKG leads History of abnormal EKG and nonobstructive CAD -Apparently patient has abnormal EKG findings with ST elevation in the past as well.  She underwent cardiac cath in 2019 and had nonobstructive CAD. -This admission, with a presenting symptoms of chest pain and persistent ST elevation on EKG, she underwent cardiac cath again.  She was noted to have distal vessel stenosis, not flow-limiting and not amenable for angioplasty. -Continue Coreg and aspirin.  Intractable nausea and vomiting Chronic nausea and vomiting -Extensive work-up in October found source likely due to bile reflux gastritis versus diabetic gastroparesis. Has quit THC since that time. -Gastric emptying study was tried today but apparently patient could not complete the study. -Currently on IV Reglan and IV Zofran as needed. -On D5 NS at 100 mill per hour.  Continue the same.   -Encourage oral intake.  -GI consult was called today.  Chronic diastolic CHF Essential hypertension  -Echo from October 2021 with EF 55 to 60% and grade 1 diastolic dysfunction. -Home medications include Coreg, amlodipine, hydralazine, lisinopril.   -Continue all.  Coreg dose was increased to 12.5 mg daily. -Continue to monitor blood pressure and heart for symptoms. -As needed labetalol.  History of symptomatic bradycardia status post pacemaker - Per Cardiology   Chronic hypercalcemia Primary hyperparathyroidism -Calcium 12.2 on presentation.  Gradually improving with IV fluid. -Patient's daughter  reports elevated calcium level for last 20 years.  She apparently has a known nodule for work-up in [place].  However parathyroid scan in October 2021 was  negative. Recent Labs  Lab 11/10/20 0040 11/11/20 0111 11/12/20 1436 11/13/20 0328 11/14/20 0119  CALCIUM 11.7* 12.4* 10.5* 11.0* 10.9*   Leukocytosis -On arrival, patient had WBC 28 with left shift -Had similarly elevated white count during previous admission for nausea and vomiting, possibly reactive leukocytosis -No signs of infection on chest x-ray, urinalysis or CT abdomen -We will treat supportively for now -Trend fever curve and white count. -Repeat labs tomorrow. Recent Labs  Lab 11/09/20 2215 11/10/20 0040 11/11/20 0111 11/13/20 0328 11/14/20 0119  WBC 27.2* 24.5* 23.7* 13.9* 12.9*  LATICACIDVEN 1.2  --   --   --   --    CKD 3b -Creatinine at baseline. Recent Labs    09/29/20 0452 10/10/20 1504 10/28/20 1838 11/09/20 1250 11/09/20 2215 11/10/20 0040 11/11/20 0111 11/12/20 1436 11/13/20 0328 11/14/20 0119  BUN 17 23 25* 31* 23 24* 24* 24* 19 16  CREATININE 1.74* 1.83* 1.98* 1.76* 1.58* 1.51* 1.63* 1.51* 1.71* 1.54*   Hypokalemia/hypophosphatemia -Potassium low at 3.2 this morning.  I would order for IV replacement. -Recheck labs tomorrow. Recent Labs  Lab 11/09/20 2215 11/09/20 2215 11/10/20 0040 11/11/20 0111 11/12/20 1436 11/13/20 0328 11/14/20 0119  K 3.3*   < > 3.4* 3.5 3.3* 3.8 3.2*  MG 2.1  --   --  2.2  --  2.0  --   PHOS 2.3*  --   --  1.8*  --  2.2*  --    < > = values in this interval not displayed.   Type 2 diabetes mellitus Hyperglycemia -Last A1c 6.5 on 10/17. -On Tresiba 5 units daily at home. -Blood sugar running high because of inability to use insulin -Currently on Lantus 5 units daily along with sliding scale insulin Accu-Cheks. -Because of poor appetite, patient is at risk of hypoglycemia..  Continue to monitor. Recent Labs  Lab 11/13/20 1646 11/13/20 2151 11/14/20 0340 11/14/20 0629 11/14/20 1143  GLUCAP 133* 183* 166* 152* 141*   Anxiety and depression - Continue home bupropion, escitalopram, trazodone, as  needed lorazepam  Mobility: Uses walker at home.   Code Status:   Code Status: Full Code  Nutritional status: Body mass index is 26 kg/m.     Diet Order            Diet regular Room service appropriate? Yes; Fluid consistency: Thin  Diet effective now                 DVT prophylaxis: heparin injection 5,000 Units Start: 11/10/20 0600   Antimicrobials:  None Fluid: Continue D5 NS at 100/h. Family Communication: : Updated patient's daughter Ms. Rosita Kea on the phone.   Infusions:  . sodium chloride    . dextrose 5 % and 0.9% NaCl 100 mL/hr at 11/14/20 0700    Scheduled Meds: . amLODipine  10 mg Oral Daily  . aspirin EC  81 mg Oral Daily  . buPROPion  150 mg Oral Daily  . carvedilol  12.5 mg Oral BID WC  . cephALEXin  500 mg Oral Q12H  . Chlorhexidine Gluconate Cloth  6 each Topical Daily  . escitalopram  20 mg Oral Daily  . heparin  5,000 Units Subcutaneous Q8H  . hydrALAZINE  25 mg Oral QHS  . influenza vaccine adjuvanted  0.5 mL Intramuscular Tomorrow-1000  . insulin aspart  0-15 Units Subcutaneous TID WC  . insulin aspart  0-5 Units Subcutaneous QHS  . insulin glargine  5 Units Subcutaneous Daily  . lisinopril  20 mg Oral Daily  . oxybutynin  10 mg Oral Daily  . pantoprazole  40 mg Oral Daily  . pneumococcal 23 valent vaccine  0.5 mL Intramuscular Tomorrow-1000  . polyethylene glycol  17 g Oral BID  . potassium chloride  40 mEq Oral Once  . rosuvastatin  20 mg Oral Daily  . sodium chloride flush  3 mL Intravenous Q12H  . traZODone  100 mg Oral QHS    Antimicrobials: Anti-infectives (From admission, onward)   Start     Dose/Rate Route Frequency Ordered Stop   11/09/20 2215  cephALEXin (KEFLEX) capsule 500 mg        500 mg Oral Every 12 hours 11/09/20 2144        PRN meds: sodium chloride, HYDROcodone-acetaminophen, lip balm, LORazepam, metoCLOPramide (REGLAN) injection, ondansetron (ZOFRAN) IV, sodium chloride flush   Objective: Vitals:    11/14/20 1046 11/14/20 1148  BP: (!) 160/89 (!) 144/85  Pulse: (!) 58 60  Resp: 18 18  Temp: (!) 97.2 F (36.2 C) 98.9 F (37.2 C)  SpO2: 96% 99%    Intake/Output Summary (Last 24 hours) at 11/14/2020 1410 Last data filed at 11/14/2020 0717 Gross per 24 hour  Intake 1000 ml  Output 2100 ml  Net -1100 ml   Filed Weights   11/09/20 1121 11/09/20 2100  Weight: 74 kg 68.7 kg   Weight change:  Body mass index is 26 kg/m.   Physical Exam: General exam: Not in physical distress.  Continues to feel tired. skin: No rashes, lesions or ulcers. HEENT: Atraumatic, normocephalic, no obvious bleeding Lungs: Clear to auscultation bilaterally CVS: Regular rate and rhythm, no murmur GI/Abd soft, mild epigastric tenderness present, nondistended, bowel sound present CNS: Alert, awake, oriented x3 Psychiatry: Depressed look Extremities: No pedal edema, no calf tenderness  Data Review: I have personally reviewed the laboratory data and studies available.  Recent Labs  Lab 11/09/20 1250 11/09/20 1250 11/09/20 2215 11/10/20 0040 11/11/20 0111 11/13/20 0328 11/14/20 0119  WBC 28.6*   < > 27.2* 24.5* 23.7* 13.9* 12.9*  NEUTROABS 24.5*  --   --   --  19.0* 9.4* 8.0*  HGB 15.6*   < > 15.0 15.4* 14.8 13.8 14.8  HCT 44.0   < > 42.6 42.7 41.3 40.0 40.9  MCV 85.9   < > 86.9 86.1 87.1 87.7 85.9  PLT 235   < > 221 223 237 206 200   < > = values in this interval not displayed.   Recent Labs  Lab 11/09/20 2215 11/09/20 2215 11/10/20 0040 11/11/20 0111 11/12/20 1436 11/13/20 0328 11/14/20 0119  NA 137   < > 137 138 134* 137 137  K 3.3*   < > 3.4* 3.5 3.3* 3.8 3.2*  CL 104   < > 106 105 105 105 107  CO2 21*   < > 20* 23 21* 23 22  GLUCOSE 197*   < > 228* 185* 132* 167* 176*  BUN 23   < > 24* 24* 24* 19 16  CREATININE 1.58*   < > 1.51* 1.63* 1.51* 1.71* 1.54*  CALCIUM 11.6*   < > 11.7* 12.4* 10.5* 11.0* 10.9*  MG 2.1  --   --  2.2  --  2.0  --   PHOS 2.3*  --   --  1.8*  --  2.2*  --     < > = values in this interval not displayed.    F/u labs ordered.  Signed, Terrilee Croak, MD [facility] 11/14/2020

## 2020-11-14 NOTE — Consult Note (Addendum)
Referring Provider:  [facility]         Primary Care Physician:  Celene Squibb, MD Primary Gastroenterologist:   unassigned          We were asked to see this patient for:    Nausea and vomiting               ASSESSMENT / PLAN:   # 69 yo female with multiple medical problems not limited to longstanding diabetes, chronic nausea / vomiting, bradycardia s/p pacemaker, primary hyperparathyroidism, PE, CKD CAD, chronic diastolic heart failure, HTN  # Admitted with nausea, vomiting and diarrhea. Patient is a very limited historian. While diarrhea may be new she has chronic nausea / vomiting. EGD by [facility] in 2019 and again October 2021 without significant findings. Symptoms felt to be secondary to gastroparesis and / or THC. She hasn't smoked marijuana since October but continues to vomit 2-3 times a week. Reports an involuntary 100 pound weight loss ( ? over two years). Non-contrast CT scans have been unrevealing.   --Currently getting reglan 10 mg TID and Zofran as needed.  On clear liquid diet.  --In setting of same symptoms it seems unlikely that repeat EGD would provide additional information.  --Suspect gastroparesis related to diabetes but Vicodin three times a day is likely to further inhibiting gastric emptying.   Patient's RN just told me that patient went down for GES this am but vomited after two bites of egg so study cancelled.  --Will try Erythromycin to "jump start" gastric emptying unless she had allergies precluding its use. -- Her GFR is < 40, would consider reducing Reglan form 10 mg to 5 mg since it is excreted mainly through the kidneys.   # Leukocytosis, WBC 28 K on arrival with left shift. Had similar elevation in WBC during admission for nausea / vomiting in October. Infectious workup has been negative. WBC down to 12.9.   # Diarrhea?  Mentioned in ED but patient says she has chronic constipation and takes Miralax as needed.   # Biliary duct dilation on CT scan,  stable.  Normal LFTs. She is s/p cholecystectomy. The degree of duct dilation seems a little more than would expect from post cholecystectomy state.   # Mild pancreatic atrophy on CT scan. This may be non-specific finding but in some cases can be from chronic pancreatitis.   # Colon cancer screening. Patient doesn't think she has ever had a colonoscopy but [facility] note mentions she had one in [place] several years ago. She denies rectal bleeding. She is not anemic. No colon masses seen on non-contrast CTscan.     Attending Physician Note   I have taken a history, examined the patient and reviewed the chart. I agree with the Advanced Practitioner's note, impression and recommendations.  *Chronic difficulties with N/V. Gastroparesis suspected but not proven. THC related N/V suspected and patient relates THC abstinence since October. N/V exacerbated by her acute illness. Adding erythromycin IV q8h. Avoiding opioids and anticholinergic meds would be very helpful. Optimal control of blood glucose. GES usually cannot be completed during times with active N/V. Clear liquids, advance to full liquids as tolerated.  *Chronic constipation with recent diarrhea. *Biliary dilation post cholecystectomy with normal LFTs, likely physiologic post cholecystectomy. *Mild pancreatic atrophy, unlikely of clinical significance.  Lucio Edward, MD [facility]     HPI:  Chief Complaint: nausea and vomiting  Melissa Lopez is a 69 y.o. female with a pmh significant for, not necessarily limited to:  longstanding diabetes, chronic nausea / vomiting, bradycardia s/p pacemaker, primary hyperparathyroidism, PE, CKD CAD, chronic diastolic heart failure, HTN   Patient has a history of chronic nausea / vomiting previously evaluated in the hospital by [facility]. In late December 2019 EGD  showed only gastritis. She was advised to stop smoking marijuana. She was seen again in late October of this year by [facility] for nausea and vomiting. EGD notable for bile reflux. Gastric emptying study was ordered but cancelled as it was decided to be done outpatient. She has not followed up with [facility].   ** Patient is a very limited historian, much of the history come from chart.   Patient was in ED 11/20 with nausea, vomiting, diarrhea and WBC of 22K.   Non-contrast CT scan without acute findings. Diagnosed with UTI and sent home on abx. Urine culture was negative.   Patient returned to ED 11/09/20 with similar symptoms. CT scan repeated and showed status post cholecystectomy with stable mild intrahepatic and moderate extrahepatic biliary dilatation. She had chest pain. EKG showed possible ST elevation MI but findings similar to two years prior. Cardiology evaluated and took for emergent cardiac cath. There was stable nonocclusive mild to moderate disease without culprit lesion for ST elevation so didn't have a STEMI. Non-contrast CT scan shows absent gallbladder. Stable.  Mild intrahepatic and moderate extrahepatic biliary dilatation. The common bile duct measures approximately 16 mm in the porta hepatis and a maximum of 10 mm in the head of the pancreas. Liver tests and lipase are normal.   Hgb normal.    PREVIOUS ENDOSCOPIC EVALUATIONS / PERTINENT STUDIES    Dec 2019 EGD for nausea / vomiting -Normal esophagus. - Z-line regular, 40 cm from the incisors. - Acute gastritis. - Small hiatal hernia. - Mucosal changes in the duodenum. - No specimens collected-  09/28/20 EGD for nausea / vomiting Normal larynx. - Normal esophagus. - Bilious gastric fluid. - Erythematous mucosa in the antrum. Biopsied. - Normal examined duodenum. Biopsied. - The presence of bile reflux suggests gastric dysmotility or bile reflux gastritis as possible causes for the patient's symptoms.  A. DUODENUM,  BIOPSY:  - Duodenal mucosa with no specific histopathologic changes  - Negative for increased intraepithelial lymphocytes or villous  architectural changes   B. STOMACH, ANTRUM, BIOPSY:  - Gastric antral mucosa with moderate nonspecific reactive gastropathy  - Warthin Starry stain is negative for Helicobacter pylori   62/13/08 Non-contrast CT scan  IMPRESSION: 1. Very small anterior pericardial effusion. 2. Stable bilateral subcentimeter low-attenuation adrenal gland nodules, likely consistent with adrenal adenomas. 3. Innumerable bilateral renal cysts. 4. Aortic atherosclerosis   11/09/20 Non-contrast CT scan  IMPRESSION: 1. No acute abdominal/pelvic findings, mass lesions or adenopathy. 2. Stable numerous bilateral renal cysts. 3. Status post cholecystectomy with stable mild intrahepatic and moderate extrahepatic biliary dilatation. 4. Aortic atherosclerosis   Past Medical History:  Diagnosis Date  . CKD (chronic kidney disease) stage 3, GFR 30-59 ml/min (HCC) 10/31/2018   - per patient report  . Coronary artery disease    per patient report - 2 caths with non-obstructive CAD  . Essential hypertension   . H/O cardiac pacemaker 10/31/2018  . Sick sinus syndrome ([place])   . Type 2 diabetes mellitus with complication, with long-term current use of insulin ([place]) 10/31/2018   with diabetic nephropathy    Past  Surgical History:  Procedure Laterality Date  . BIOPSY  09/28/2020   Procedure: BIOPSY;  Surgeon: Ronald Lobo, MD;  Location: [facility];  Service: Endoscopy;;  . CARDIAC PACEMAKER PLACEMENT  2008   in [place] for sick [place] syndrome  . CORONARY/GRAFT ACUTE MI REVASCULARIZATION N/A 11/09/2020   Procedure: CORONARY/GRAFT ACUTE MI REVASCULARIZATION;  Surgeon: Leonie Man, MD;  Location: [facility];  Service: Cardiovascular;  Laterality: N/A;  . ESOPHAGOGASTRODUODENOSCOPY N/A 09/28/2020   Procedure: ESOPHAGOGASTRODUODENOSCOPY (EGD);  Surgeon:  Ronald Lobo, MD;  Location: [facility];  Service: Endoscopy;  Laterality: N/A;  . ESOPHAGOGASTRODUODENOSCOPY (EGD) WITH PROPOFOL N/A 11/10/2018   Procedure: ESOPHAGOGASTRODUODENOSCOPY (EGD) WITH PROPOFOL;  Surgeon: Wilford Corner, MD;  Location: [facility];  Service: Endoscopy;  Laterality: N/A;  . LEFT HEART CATH AND CORONARY ANGIOGRAPHY N/A 10/31/2018   Procedure: LEFT HEART CATH AND CORONARY ANGIOGRAPHY;  Surgeon: Leonie Man, MD;  Location: [facility];  Service: Cardiovascular;  Laterality: N/A;  . PACEMAKER GENERATOR CHANGE  09/2018   in [place]    Prior to Admission medications   Medication Sig Start Date End Date Taking? Authorizing Provider  acetaminophen (TYLENOL) 650 MG CR tablet Take 1,300 mg by mouth every 8 (eight) hours as needed for pain.   Yes [provider]  amLODipine (NORVASC) 10 MG tablet TAKE 1 TABLET BY MOUTH EVERY DAY 10/10/20  Yes Imogene Burn, PA-C  aspirin 81 MG chewable tablet Chew 1 tablet (81 mg total) by mouth daily. 11/13/18  Yes Dessa Phi, DO  buPROPion [facility] SR) 150 MG 12 hr tablet Take 150 mg by mouth daily. 07/13/20  Yes [provider]  carvedilol (COREG) 6.25 MG tablet Take 1 tablet (6.25 mg total) by mouth 2 (two) times daily. 10/10/20  Yes Imogene Burn, PA-C  cephALEXin (KEFLEX) 250 MG capsule Take 500 mg by mouth 4 (four) times daily.   Yes [provider]  escitalopram (LEXAPRO) 20 MG tablet Take 20 mg by mouth daily. 08/11/20  Yes [provider]  gabapentin (NEURONTIN) 100 MG capsule Take 100 mg by mouth 3 (three) times daily. 08/11/20  Yes [provider]  hydrALAZINE (APRESOLINE) 25 MG tablet Take 25 mg by mouth at bedtime. 11/09/20  Yes [provider]  insulin degludec (TRESIBA FLEXTOUCH) 100 UNIT/ML FlexTouch Pen Inject 5 Units into the skin daily.   Yes [provider]  lisinopril (ZESTRIL) 20 MG tablet Take 1 tablet (20 mg total) by mouth  daily. 10/10/20 01/08/21 Yes Imogene Burn, PA-C  LORazepam (ATIVAN) 1 MG tablet Take 1 mg by mouth daily as needed for anxiety. 05/10/20  Yes [provider]  Multiple Vitamin (MULTIVITAMIN WITH MINERALS) TABS tablet Take 1 tablet by mouth daily. 09/30/20  Yes Hongalgi, Lenis Dickinson, MD  Nutritional Supplements (,FEEDING SUPPLEMENT, PROSOURCE PLUS) liquid Take 30 mLs by mouth daily. 09/29/20  Yes Hongalgi, Lenis Dickinson, MD  omeprazole (PRILOSEC) 20 MG capsule Take 20 mg by mouth daily.   Yes [provider]  ondansetron (ZOFRAN) 4 MG tablet Take 1 tablet (4 mg total) by mouth every 6 (six) hours. 10/28/20  Yes Couture, Cortni S, PA-C  oxybutynin (DITROPAN-XL) 10 MG 24 hr tablet Take 10 mg by mouth daily.    Yes [provider]  polyethylene glycol (MIRALAX / GLYCOLAX) 17 g packet Take 17 g by mouth 2 (two) times daily. 09/29/20  Yes Hongalgi, Lenis Dickinson, MD  rosuvastatin (CRESTOR) 5 MG tablet Take one tablet  daily 10/10/20  Yes Imogene Burn, PA-C  tiZANidine (ZANAFLEX) 2 MG tablet Take 2 mg by mouth 3 (three) times daily.  10/30/20  Yes [provider]  traZODone (DESYREL) 100 MG tablet Take 100 mg by mouth at bedtime.   Yes [provider]    Current Facility-Administered Medications  Medication Dose Route Frequency Provider Last Rate Last Admin  . 0.9 %  sodium chloride infusion  250 mL Intravenous PRN Leonie Man, MD      . amLODipine (NORVASC) tablet 10 mg  10 mg Oral Daily Leonie Man, MD   10 mg at 11/14/20 1011  . aspirin EC tablet 81 mg  81 mg Oral Daily Isaiah Serge, NP   81 mg at 11/14/20 1011  . buPROPion (WELLBUTRIN SR) 12 hr tablet 150 mg  150 mg Oral Daily Leonie Man, MD   150 mg at 11/14/20 1016  . carvedilol (COREG) tablet 12.5 mg  12.5 mg Oral BID WC Freada Bergeron, MD   12.5 mg at 11/14/20 1010  . cephALEXin (KEFLEX) capsule 500 mg  500 mg Oral Q12H Leonie Man, MD   500 mg at 11/14/20 1012  . Chlorhexidine  Gluconate Cloth 2 % PADS 6 each  6 each Topical Daily Arnoldo Lenis, MD   6 each at 11/14/20 1012  . dextrose 5 %-0.9 % sodium chloride infusion   Intravenous Continuous Dahal, Binaya, MD 100 mL/hr at 11/14/20 0700 Rate Verify at 11/14/20 0700  . escitalopram (LEXAPRO) tablet 20 mg  20 mg Oral Daily Leonie Man, MD   20 mg at 11/14/20 1011  . heparin injection 5,000 Units  5,000 Units Subcutaneous Q8H Leonie Man, MD   5,000 Units at 11/14/20 1443  . hydrALAZINE (APRESOLINE) tablet 25 mg  25 mg Oral QHS Leonie Man, MD   25 mg at 11/13/20 2114  . HYDROcodone-acetaminophen (NORCO/VICODIN) 5-325 MG per tablet 1 tablet  1 tablet Oral Q6H PRN Terrilee Croak, MD   1 tablet at 11/13/20 2114  . influenza vaccine adjuvanted (FLUAD) injection 0.5 mL  0.5 mL Intramuscular Tomorrow-1000 Arnoldo Lenis, MD      . insulin aspart (novoLOG) injection 0-15 Units  0-15 Units Subcutaneous TID WC Marcelyn Bruins, MD   2 Units at 11/14/20 1223  . insulin aspart (novoLOG) injection 0-5 Units  0-5 Units Subcutaneous QHS Marcelyn Bruins, MD   2 Units at 11/09/20 2319  . insulin glargine (LANTUS) injection 5 Units  5 Units Subcutaneous Daily Leonie Man, MD   5 Units at 11/14/20 1012  . lip balm (CARMEX) ointment   Topical PRN Leonie Man, MD   Given at 11/09/20 2313  . lisinopril (ZESTRIL) tablet 20 mg  20 mg Oral Daily Nahser, Wonda Cheng, MD   20 mg at 11/14/20 1010  . LORazepam (ATIVAN) tablet 1 mg  1 mg Oral Daily PRN Leonie Man, MD   1 mg at 11/11/20 0233  . metoCLOPramide (REGLAN) injection 10 mg  10 mg Intravenous Q8H PRN Dahal, Marlowe Aschoff, MD   10 mg at 11/14/20 1453  . ondansetron (ZOFRAN) injection 4 mg  4 mg Intravenous Q6H PRN Leonie Man, MD   4 mg at 11/14/20 1228  . oxybutynin (DITROPAN-XL) 24 hr tablet 10 mg  10 mg Oral Daily Leonie Man, MD   10 mg at 11/14/20 1013  . pantoprazole (PROTONIX) EC tablet 40 mg  40 mg Oral Daily [name],  Leonie Green, MD   40 mg at  11/14/20 1010  . pneumococcal 23 valent vaccine (PNEUMOVAX-23) injection 0.5 mL  0.5 mL Intramuscular Tomorrow-1000 Arnoldo Lenis, MD      . polyethylene glycol (MIRALAX / GLYCOLAX) packet 17 g  17 g Oral BID Leonie Man, MD   17 g at 11/13/20 8099  . rosuvastatin (CRESTOR) tablet 20 mg  20 mg Oral Daily Freada Bergeron, MD   20 mg at 11/14/20 1010  . sodium chloride flush (NS) 0.9 % injection 3 mL  3 mL Intravenous Q12H Leonie Man, MD   3 mL at 11/10/20 2041  . sodium chloride flush (NS) 0.9 % injection 3 mL  3 mL Intravenous PRN Leonie Man, MD      . traZODone (DESYREL) tablet 100 mg  100 mg Oral QHS Leonie Man, MD   100 mg at 11/13/20 2114    Allergies as of 11/09/2020 - Review Complete 11/09/2020  Allergen Reaction Noted  . Oxycodone-acetaminophen Anaphylaxis 10/31/2018  . Contrast media [iodinated diagnostic agents]  04/13/2020  . No known allergies  04/13/2020  . Oxycodone  04/13/2020  . Xarelto  [rivaroxaban] Swelling 04/13/2020    Family History  Family history unknown: Yes    Social History   Socioeconomic History  . Marital status: Single    Spouse name: Not on file  . Number of children: 4  . Years of education: Not on file  . Highest education level: Not on file  Occupational History  . Not on file  Tobacco Use  . Smoking status: Former Smoker    Years: 3.00    Quit date: 12/09/1969    Years since quitting: 50.9  . Smokeless tobacco: Never Used  Substance and Sexual Activity  . Alcohol use: Not Currently  . Drug use: Never  . Sexual activity: Not on file  Other Topics Concern  . Not on file  Social History Narrative   Moved from [place] to be with daughter in [place].   Now in [employer]   Social Determinants of Health   Financial Resource Strain:   . Difficulty of Paying Living Expenses: Not on file  Food Insecurity:   . Worried About Charity fundraiser in the Last Year: Not on file  . Ran Out of Food in the Last Year: Not on  file  Transportation Needs:   . Lack of Transportation (Medical): Not on file  . Lack of Transportation (Non-Medical): Not on file  Physical Activity:   . Days of Exercise per Week: Not on file  . Minutes of Exercise per Session: Not on file  Stress:   . Feeling of Stress : Not on file  Social Connections:   . Frequency of Communication with Friends and Family: Not on file  . Frequency of Social Gatherings with Friends and Family: Not on file  . Attends Religious Services: Not on file  . Active Member of Clubs or Organizations: Not on file  . Attends Archivist Meetings: Not on file  . Marital Status: Not on file  Intimate Partner Violence:   . Fear of Current or Ex-Partner: Not on file  . Emotionally Abused: Not on file  . Physically Abused: Not on file  . Sexually Abused: Not on file    Review of Systems: All systems reviewed and negative except where noted in HPI.    OBJECTIVE:    Physical Exam: Vital signs in last 24 hours: Temp:  [97.2  F (36.2 C)-98.9 F (37.2 C)] 98.9 F (37.2 C) (12/07 1148) Pulse Rate:  [58-65] 60 (12/07 1148) Resp:  [18] 18 (12/07 1148) BP: (142-162)/(79-94) 144/85 (12/07 1148) SpO2:  [94 %-99 %] 99 % (12/07 1148) Last BM Date: 11/08/20 General:   Alert, well-developed, female in NAD Psych:  Pleasant, cooperative. Normal mood and affect. Eyes:  Pupils equal, sclera clear, no icterus.   Conjunctiva pink. Ears:  Normal auditory acuity. Nose:  No deformity, discharge,  or lesions. Neck:  Supple; no masses Lungs:  Clear throughout to auscultation.   No wheezes, crackles, or rhonchi.  Heart:  Regular rate and rhythm; no lower extremity edema Abdomen:  Soft, non-distended, nontender, BS active, no palp mass   Rectal:  Deferred  Msk:  Symmetrical without gross deformities. . Neurologic:  Alert and  oriented x4;  grossly normal neurologically. Skin:  Intact without significant lesions or rashes.  Filed Weights   11/09/20 1121  11/09/20 2100  Weight: 74 kg 68.7 kg     Scheduled inpatient medications . amLODipine  10 mg Oral Daily  . aspirin EC  81 mg Oral Daily  . buPROPion  150 mg Oral Daily  . carvedilol  12.5 mg Oral BID WC  . cephALEXin  500 mg Oral Q12H  . Chlorhexidine Gluconate Cloth  6 each Topical Daily  . escitalopram  20 mg Oral Daily  . heparin  5,000 Units Subcutaneous Q8H  . hydrALAZINE  25 mg Oral QHS  . influenza vaccine adjuvanted  0.5 mL Intramuscular Tomorrow-1000  . insulin aspart  0-15 Units Subcutaneous TID WC  . insulin aspart  0-5 Units Subcutaneous QHS  . insulin glargine  5 Units Subcutaneous Daily  . lisinopril  20 mg Oral Daily  . oxybutynin  10 mg Oral Daily  . pantoprazole  40 mg Oral Daily  . pneumococcal 23 valent vaccine  0.5 mL Intramuscular Tomorrow-1000  . polyethylene glycol  17 g Oral BID  . rosuvastatin  20 mg Oral Daily  . sodium chloride flush  3 mL Intravenous Q12H  . traZODone  100 mg Oral QHS      Intake/Output from previous day: 12/06 0701 - 12/07 0700 In: 1000 [I.V.:1000] Out: 700 [Urine:700] Intake/Output this shift: Total I/O In: -  Out: 1400 [Urine:1400]   Lab Results: Recent Labs    11/13/20 0328 11/14/20 0119  WBC 13.9* 12.9*  HGB 13.8 14.8  HCT 40.0 40.9  PLT 206 200   BMET Recent Labs    11/12/20 1436 11/13/20 0328 11/14/20 0119  NA 134* 137 137  K 3.3* 3.8 3.2*  CL 105 105 107  CO2 21* 23 22  GLUCOSE 132* 167* 176*  BUN 24* 19 16  CREATININE 1.51* 1.71* 1.54*  CALCIUM 10.5* 11.0* 10.9*   LFT No results for input(s): PROT, ALBUMIN, AST, ALT, ALKPHOS, BILITOT, BILIDIR, IBILI in the last 72 hours. PT/INR No results for input(s): LABPROT, INR in the last 72 hours. Hepatitis Panel No results for input(s): HEPBSAG, HCVAB, HEPAIGM, HEPBIGM in the last 72 hours.   . CBC Latest Ref Rng & Units 11/14/2020 11/13/2020 11/11/2020  WBC 4.0 - 10.5 K/uL 12.9(H) 13.9(H) 23.7(H)  Hemoglobin 12.0 - 15.0 g/dL 14.8 13.8 14.8   Hematocrit 36 - 46 % 40.9 40.0 41.3  Platelets 150 - 400 K/uL 200 206 237    . CMP Latest Ref Rng & Units 11/14/2020 11/13/2020 11/12/2020  Glucose 70 - 99 mg/dL 176(H) 167(H) 132(H)  BUN 8 - 23 mg/dL 16  19 24(H)  Creatinine 0.44 - 1.00 mg/dL 1.54(H) 1.71(H) 1.51(H)  Sodium 135 - 145 mmol/L 137 137 134(L)  Potassium 3.5 - 5.1 mmol/L 3.2(L) 3.8 3.3(L)  Chloride 98 - 111 mmol/L 107 105 105  CO2 22 - 32 mmol/L 22 23 21(L)  Calcium 8.9 - 10.3 mg/dL 10.9(H) 11.0(H) 10.5(H)  Total Protein 6.5 - 8.1 g/dL - - -  Total Bilirubin 0.3 - 1.2 mg/dL - - -  Alkaline Phos 38 - 126 U/L - - -  AST 15 - 41 U/L - - -  ALT 0 - 44 U/L - - -   Studies/Results: No results found.  Active Problems:   H/O cardiac pacemaker   ST elevation on EKG without MI   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[place])   Type 2 diabetes mellitus with complication, with long-term current use of insulin (HCC)   CKD (chronic kidney disease) stage 3, GFR 30-59 ml/min (HCC) - per patient report   Essential hypertension   Nausea with vomiting   Hypercalcemia   Hypokalemia   Chronic diastolic CHF (congestive heart failure) ([place])   Hyperparathyroidism, primary ([place])   Chest pain of uncertain etiology   Primary hypertension    Tye Savoy, NP-C @  11/14/2020, 3:45 PM

## 2020-11-15 LAB — PHOSPHORUS: Phosphorus: 2.3 mg/dL — ABNORMAL LOW (ref 2.5–4.6)

## 2020-11-15 LAB — GLUCOSE, CAPILLARY
Glucose-Capillary: 139 mg/dL — ABNORMAL HIGH (ref 70–99)
Glucose-Capillary: 227 mg/dL — ABNORMAL HIGH (ref 70–99)
Glucose-Capillary: 125 mg/dL — ABNORMAL HIGH (ref 70–99)
Glucose-Capillary: 129 mg/dL — ABNORMAL HIGH (ref 70–99)

## 2020-11-15 LAB — CBC WITH DIFFERENTIAL/PLATELET
Abs Immature Granulocytes: 0.06 10*3/uL (ref 0.00–0.07)
Basophils Absolute: 0 10*3/uL (ref 0.0–0.1)
Basophils Relative: 0 %
Eosinophils Absolute: 0.2 10*3/uL (ref 0.0–0.5)
Eosinophils Relative: 2 %
HCT: 39.2 % (ref 36.0–46.0)
Hemoglobin: 13.6 g/dL (ref 12.0–15.0)
Immature Granulocytes: 0 %
Lymphocytes Relative: 28 %
Lymphs Abs: 3.7 10*3/uL (ref 0.7–4.0)
MCH: 30.4 pg (ref 26.0–34.0)
MCHC: 34.7 g/dL (ref 30.0–36.0)
MCV: 87.7 fL (ref 80.0–100.0)
Monocytes Absolute: 1.4 10*3/uL — ABNORMAL HIGH (ref 0.1–1.0)
Monocytes Relative: 10 %
Neutro Abs: 8 10*3/uL — ABNORMAL HIGH (ref 1.7–7.7)
Neutrophils Relative %: 60 %
Platelets: 198 10*3/uL (ref 150–400)
RBC: 4.47 MIL/uL (ref 3.87–5.11)
RDW: 12.1 % (ref 11.5–15.5)
WBC: 13.4 10*3/uL — ABNORMAL HIGH (ref 4.0–10.5)
nRBC: 0 % (ref 0.0–0.2)

## 2020-11-15 LAB — BASIC METABOLIC PANEL
Anion gap: 9 (ref 5–15)
BUN: 17 mg/dL (ref 8–23)
CO2: 22 mmol/L (ref 22–32)
Calcium: 10.9 mg/dL — ABNORMAL HIGH (ref 8.9–10.3)
Chloride: 106 mmol/L (ref 98–111)
Creatinine, Ser: 1.53 mg/dL — ABNORMAL HIGH (ref 0.44–1.00)
GFR, Estimated: 37 mL/min — ABNORMAL LOW (ref >60)
Glucose, Bld: 135 mg/dL — ABNORMAL HIGH (ref 70–99)
Potassium: 3.4 mmol/L — ABNORMAL LOW (ref 3.5–5.1)
Sodium: 137 mmol/L (ref 135–145)

## 2020-11-15 LAB — MAGNESIUM: Magnesium: 1.7 mg/dL (ref 1.7–2.4)

## 2020-11-15 MED ORDER — POTASSIUM CHLORIDE 10 MEQ/100ML IV SOLN
10.0000 meq | INTRAVENOUS | Status: AC
  Administered 2020-11-15 (×2): 10 meq via INTRAVENOUS
  Filled 2020-11-15 (×2): qty 100

## 2020-11-15 MED ORDER — METOCLOPRAMIDE HCL 5 MG/ML IJ SOLN
5.0000 mg | Freq: Three times a day (TID) | INTRAMUSCULAR | Status: DC | PRN
  Administered 2020-11-15 – 2020-11-17 (×4): 5 mg via INTRAVENOUS
  Filled 2020-11-15 (×4): qty 2

## 2020-11-15 MED ORDER — POTASSIUM PHOSPHATES 15 MMOLE/5ML IV SOLN
30.0000 mmol | Freq: Once | INTRAVENOUS | Status: AC
  Administered 2020-11-15: 30 mmol via INTRAVENOUS
  Filled 2020-11-15: qty 10

## 2020-11-15 MED ORDER — HYDROCODONE-ACETAMINOPHEN 5-325 MG PO TABS
1.0000 | ORAL_TABLET | Freq: Three times a day (TID) | ORAL | Status: DC | PRN
  Administered 2020-11-15 (×3): 1 via ORAL
  Filled 2020-11-15 (×3): qty 1

## 2020-11-15 MED ORDER — MAGNESIUM SULFATE 2 GM/50ML IV SOLN
2.0000 g | Freq: Once | INTRAVENOUS | Status: AC
  Administered 2020-11-15: 2 g via INTRAVENOUS
  Filled 2020-11-15: qty 50

## 2020-11-15 NOTE — Progress Notes (Signed)
Mobility Specialist: Progress Note   11/15/20 1756  Mobility  Activity Ambulated in hall  Level of Assistance Minimal assist, patient does 75% or more  Assistive Device Front wheel walker  Distance Ambulated (ft) 50 ft  Mobility Response Tolerated well  Mobility performed by Mobility specialist  $Mobility charge 1 Mobility   Pre-Mobility: 62 HR Post-Mobility: 71 HR  Pt c/o pain in her R arm from IV, RN notified. Pt was asx during ambulation. Encouraged pt to continue ambulation tomorrow.   [facility] Kammy Klett Mobility Specialist

## 2020-11-15 NOTE — Progress Notes (Addendum)
Daily Rounding Note  11/15/2020, 10:44 AM  LOS: 6 days   SUBJECTIVE:   Chief complaint:  Nausea, vomiting    Pt telling me she has not had any nausea for almost 24 hours.  However she has received 3 doses of Zofran throughout yesterday and 1 this morning.  Got a dose of Reglan 5 mg IV this morning, this is also ordered as needed. No BM yet today. Tolerating some solid food looks like a full liquid diet on the tray in the room.   OBJECTIVE:         Vital signs in last 24 hours:    Temp:  [97.2 F (36.2 C)-98.9 F (37.2 C)] 97.9 F (36.6 C) (12/08 0836) Pulse Rate:  [58-67] 58 (12/08 0836) Resp:  [18] 18 (12/08 0836) BP: (135-161)/(85-100) 135/92 (12/08 0836) SpO2:  [96 %-100 %] 96 % (12/08 0836) Last BM Date: 11/09/20 Filed Weights   11/09/20 1121 11/09/20 2100  Weight: 74 kg 68.7 kg   General: Lethargic but awakens easily. Heart: RRR Chest: No labored breathing or cough. Abdomen: Soft, nontender, nondistended.  Bowel sounds active Extremities: No CCE. Neuro/Psych: Sleepy but awakens easily.  Moves all 4 limbs, strength not tested.  No tremors.  Intake/Output from previous day: 12/07 0701 - 12/08 0700 In: 1699.8 [P.O.:240; I.V.:1359.8; IV Piggyback:100] Out: 1400 [Urine:1400]  Intake/Output this shift: Total I/O In: -  Out: 400 [Urine:400]  Lab Results: Recent Labs    11/13/20 0328 11/14/20 0119 11/15/20 0149  WBC 13.9* 12.9* 13.4*  HGB 13.8 14.8 13.6  HCT 40.0 40.9 39.2  PLT 206 200 198   BMET Recent Labs    11/13/20 0328 11/14/20 0119 11/15/20 0149  NA 137 137 137  K 3.8 3.2* 3.4*  CL 105 107 106  CO2 23 22 22   GLUCOSE 167* 176* 135*  BUN 19 16 17   CREATININE 1.71* 1.54* 1.53*  CALCIUM 11.0* 10.9* 10.9*    Studies/Results: No results found.   Scheduled Meds: . amLODipine  10 mg Oral Daily  . aspirin EC  81 mg Oral Daily  . buPROPion  150 mg Oral Daily  . carvedilol  12.5 mg  Oral BID WC  . cephALEXin  500 mg Oral Q12H  . Chlorhexidine Gluconate Cloth  6 each Topical Daily  . escitalopram  20 mg Oral Daily  . heparin  5,000 Units Subcutaneous Q8H  . hydrALAZINE  25 mg Oral QHS  . influenza vaccine adjuvanted  0.5 mL Intramuscular Tomorrow-1000  . insulin aspart  0-15 Units Subcutaneous TID WC  . insulin aspart  0-5 Units Subcutaneous QHS  . insulin glargine  5 Units Subcutaneous Daily  . lisinopril  20 mg Oral Daily  . oxybutynin  10 mg Oral Daily  . pantoprazole  40 mg Oral Daily  . pneumococcal 23 valent vaccine  0.5 mL Intramuscular Tomorrow-1000  . polyethylene glycol  17 g Oral BID  . rosuvastatin  20 mg Oral Daily  . sodium chloride flush  3 mL Intravenous Q12H  . traZODone  100 mg Oral QHS   Continuous Infusions: . sodium chloride    . dextrose 5 % and 0.9% NaCl 100 mL/hr at 11/15/20 0555  . erythromycin 205 mg (11/15/20 0556)  . magnesium sulfate bolus IVPB    . potassium chloride 10 mEq (11/15/20 0954)  . potassium PHOSPHATE IVPB (in mmol)     PRN Meds:.sodium chloride, HYDROcodone-acetaminophen, lip balm, LORazepam, metoCLOPramide (REGLAN) injection, ondansetron (  ZOFRAN) IV, sodium chloride flush   ASSESMENT:   *   N/V.  Weight loss. Previous EGDs 2019 (small HH, gastritis, mucosal changes in duodenum, no biopsy), 10/21 (gastric erythema, bilious gastric fluid, normal duodenum, path: Nonspecific reactive gastropathy, no H. pylori.  No duodenal pathology). Suspect Gastroparesis secondary to chronic Vicodin, IDDM, anticholinergic (Ditropan) suspected.  No THC since 09/2020.   Erythromycin, Protonix 40 mg po/day added. Metoclopramide, zofran prn in place. Home meds include omeprazole 20 mg/day but no other meds. Situation is a little bit confusing because the patient says her nausea is better but that is in the setting of receiving prn low-dose Reglan and Zofran.  *   CKD, stage III.  *   Chronic constipation w some recent loose stools.   Constipation not surprising in setting tid narcotics.  Continues on scheduled MiraLAX. Previous colonoscopy in [place] somewhere around 2015 or before.    *   S/P cholecystectomy.  Stable mild intrahepatic and moderate extrahepatic biliary duct dilatation.  Pancreatic atrophy.  LFTs, lipase normal.  *    IDDM.  *   Hypokalemia.   PLAN   *   As patient is telling me her nausea is better, did not go through with ordering standing doses of low-dose Reglan but this may be an option going forward.  *    Advance to carb modified diet.  No plans for upper endoscopy.Azucena Freed  11/15/2020, 10:44 AM Phone [phone]    Attending Physician Note   I have taken an interval history, reviewed the chart and examined the patient. I agree with the Advanced Practitioner's note, impression and recommendations.   Lucio Edward, MD [facility]

## 2020-11-15 NOTE — Progress Notes (Signed)
PROGRESS NOTE  [name]  DOB: November 15, 1951  PCP: Celene Squibb, MD VOH:[id number]  DOA: 11/09/2020  LOS: 6 days   Chief Complaint  Patient presents with  . Emesis   Brief narrative: Melissa Lopez is a 69 y.o. 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 On 12/2, patient presented to the ED at [facility] with complaint of 2 weeks history of worsening nausea, vomiting and diarrhea associated with decreased oral intake.  Patient was admitted in October with similar symptoms with no clear etiology of her intractable nausea and vomiting.  She had multiple negative CT scans.  EGD showed bile reflux suggestive of gastric dysmotility versus reflux gastritis.  Her symptoms resolved and she was discharged.  She did not tolerate gastric emptying studies.  On 11/20, patient returned back to the ED with similar symptoms and left lower quadrant pain.  She was noted to have UTI, started on Keflex and discharged to home. However her symptoms persisted and hence patient presented to the ED again on 12/2.  Marijuana use was also suspected to be one of the possible causes of her symptoms.  Patient states that she has not smoked marijuana for last 1 month.  In the ED, patient's blood pressure was elevated to 140s, otherwise stable. CT scan of abdomen did not show any acute findings. Initial troponin was negative.  However her EKG was concerning for ST elevation in multiple leads. Patient was transported emergently to: Cath Lab and underwent cardiac cath. Hospitalist service was consulted for comanagement of medical issues. Cardiac cath was unremarkable and hence patient was transferred to hospitalist service as the primary team. Despite continued use of scheduled Reglan, and as needed IV Zofran, patient continues to have  significant nausea, vomiting.  GI consult was called and patient was started on IV erythromycin.  Subjective: Patient was seen and examined this morning.  Last vomiting was yesterday.  She is trying clear liquid diet.  Assessment/Plan: Intractable nausea and vomiting Chronic nausea and vomiting -Extensive work-up in October found source likely due to bile reflux gastritis versus diabetic gastroparesis. Has quit THC since that time. -12/7, gastric emptying study was tried today but patient could not complete the study. -GI consultation appreciated.  Currently on erythromycin IV 3 times daily.  Also reduced dose of Reglan as needed. -Encouraged to minimize the use of opiate which could make nausea vomiting worse. -Continue D5 NS at 100/h. -Encourage oral intake.  Currently on clear liquid diet.  Advance as tolerated.  Acute chest pain with ST elevation in multiple EKG leads History of abnormal EKG and nonobstructive CAD -Apparently patient has abnormal EKG findings with ST elevation in the past as well.  She underwent cardiac cath in 2019 and had nonobstructive CAD. -This admission, with a presenting symptoms of chest pain and persistent ST elevation on EKG, she underwent cardiac cath again.  She was noted to have distal vessel stenosis, not flow-limiting and not amenable for angioplasty. -Continue Coreg and aspirin.  Chronic diastolic CHF Essential hypertension  -Echo from October 2021 with EF 55 to 60% and grade 1 diastolic dysfunction. -Home medications include Coreg, amlodipine, hydralazine, lisinopril.   -Continue all.  Coreg dose was increased to 12.5 mg daily. -Continue to monitor blood pressure and heart for symptoms. -As needed labetalol.  History of symptomatic bradycardia status post pacemaker - Per Cardiology  Chronic hypercalcemia Primary hyperparathyroidism -Calcium 12.2 on presentation.  Gradually improving with IV fluid. -Patient's daughter reports elevated  calcium level for last 20 years.  She apparently has a known nodule for work-up in [place].  However parathyroid scan in October 2021 was negative. Recent Labs  Lab 11/11/20 0111 11/12/20 1436 11/13/20 0328 11/14/20 0119 11/15/20 0149  CALCIUM 12.4* 10.5* 11.0* 10.9* 10.9*   Leukocytosis -On arrival, patient had WBC 28 with left shift -Had similarly elevated white count during previous admission for nausea and vomiting, possibly reactive leukocytosis -No signs of infection on chest x-ray, urinalysis or CT abdomen -No fever.  WBC count trending down by self without antibiotics. Recent Labs  Lab 11/09/20 2215 11/09/20 2215 11/10/20 0040 11/11/20 0111 11/13/20 0328 11/14/20 0119 11/15/20 0149  WBC 27.2*   < > 24.5* 23.7* 13.9* 12.9* 13.4*  LATICACIDVEN 1.2  --   --   --   --   --   --    < > = values in this interval not displayed.   CKD 3b -Creatinine at baseline. Recent Labs    10/10/20 1504 10/28/20 1838 11/09/20 1250 11/09/20 2215 11/10/20 0040 11/11/20 0111 11/12/20 1436 11/13/20 0328 11/14/20 0119 11/15/20 0149  BUN 23 25* 31* 23 24* 24* 24* 19 16 17   CREATININE 1.83* 1.98* 1.76* 1.58* 1.51* 1.63* 1.51* 1.71* 1.54* 1.53*   Hypokalemia/hypophosphatemia/hypomagnesemia -Potassium, magnesium and phosphorus are all low this morning.  IV replacements ordered.  Unable to tolerate oral replacement.  Recheck tomorrow.  Recent Labs  Lab 11/09/20 2215 11/10/20 0040 11/11/20 0111 11/12/20 1436 11/13/20 0328 11/14/20 0119 11/15/20 0149  K 3.3*   < > 3.5 3.3* 3.8 3.2* 3.4*  MG 2.1  --  2.2  --  2.0  --  1.7  PHOS 2.3*  --  1.8*  --  2.2*  --  2.3*   < > = values in this interval not displayed.   Type 2 diabetes mellitus Hyperglycemia -Last A1c 6.5 on 10/17. -On Tresiba 5 units daily at home. -Currently on dextrose drip because of poor oral intake. -Blood sugar control with Lantus 5 units daily and sliding scale insulin.  Continue the same. Recent Labs   Lab 11/14/20 0629 11/14/20 1143 11/14/20 1658 11/14/20 2129 11/15/20 0607  GLUCAP 152* 141* 113* 119* 139*   Anxiety and depression - Continue home bupropion, escitalopram, trazodone, as needed lorazepam  Mobility: Uses walker at home.   Code Status:   Code Status: Full Code  Nutritional status: Body mass index is 26 kg/m.     Diet Order            Diet regular Room service appropriate? Yes; Fluid consistency: Thin  Diet effective now                 DVT prophylaxis: heparin injection 5,000 Units Start: 11/10/20 0600   Antimicrobials:  None Fluid: Continue D5 NS at 100/h. Family Communication:  None at bedside   Infusions:  . sodium chloride    . dextrose 5 % and 0.9% NaCl 100 mL/hr at 11/15/20 0555  . erythromycin 205 mg (11/15/20 0556)  . magnesium sulfate bolus IVPB    . potassium chloride 10 mEq (11/15/20 0954)  . potassium PHOSPHATE IVPB (in mmol)      Scheduled Meds: . amLODipine  10 mg Oral Daily  . aspirin EC  81 mg Oral Daily  . buPROPion  150 mg Oral Daily  . carvedilol  12.5 mg Oral BID WC  .  cephALEXin  500 mg Oral Q12H  . Chlorhexidine Gluconate Cloth  6 each Topical Daily  . escitalopram  20 mg Oral Daily  . heparin  5,000 Units Subcutaneous Q8H  . hydrALAZINE  25 mg Oral QHS  . influenza vaccine adjuvanted  0.5 mL Intramuscular Tomorrow-1000  . insulin aspart  0-15 Units Subcutaneous TID WC  . insulin aspart  0-5 Units Subcutaneous QHS  . insulin glargine  5 Units Subcutaneous Daily  . lisinopril  20 mg Oral Daily  . oxybutynin  10 mg Oral Daily  . pantoprazole  40 mg Oral Daily  . pneumococcal 23 valent vaccine  0.5 mL Intramuscular Tomorrow-1000  . polyethylene glycol  17 g Oral BID  . rosuvastatin  20 mg Oral Daily  . sodium chloride flush  3 mL Intravenous Q12H  . traZODone  100 mg Oral QHS    Antimicrobials: Anti-infectives (From admission, onward)   Start     Dose/Rate Route Frequency Ordered Stop   11/15/20 0800   erythromycin 250 mg in sodium chloride 0.9 % 100 mL IVPB  Status:  Discontinued        250 mg 100 mL/hr over 60 Minutes Intravenous 3 times daily before meals 11/14/20 1703 11/14/20 1711   11/14/20 1900  erythromycin 205 mg in sodium chloride 0.9 % 100 mL IVPB        3 mg/kg  68.7 kg 100 mL/hr over 60 Minutes Intravenous Every 8 hours 11/14/20 1711     11/09/20 2215  cephALEXin (KEFLEX) capsule 500 mg        500 mg Oral Every 12 hours 11/09/20 2144        PRN meds: sodium chloride, HYDROcodone-acetaminophen, lip balm, LORazepam, metoCLOPramide (REGLAN) injection, ondansetron (ZOFRAN) IV, sodium chloride flush   Objective: Vitals:   11/14/20 2346 11/15/20 0836  BP: (!) 141/100 (!) 135/92  Pulse: 67 (!) 58  Resp: 18 18  Temp: (!) 97.5 F (36.4 C) 97.9 F (36.6 C)  SpO2: 100% 96%    Intake/Output Summary (Last 24 hours) at 11/15/2020 1005 Last data filed at 11/15/2020 0838 Gross per 24 hour  Intake 1699.82 ml  Output 400 ml  Net 1299.82 ml   Filed Weights   11/09/20 1121 11/09/20 2100  Weight: 74 kg 68.7 kg   Weight change:  Body mass index is 26 kg/m.   Physical Exam: General exam: Not in physical distress.  Continues to feel tired skin: No rashes, lesions or ulcers. HEENT: Atraumatic, normocephalic, no obvious bleeding Lungs: Clear to auscultation bilaterally CVS: Regular rate and rhythm, no murmur GI/Abd soft, mild epigastric tenderness present, nondistended, bowel sound present CNS: Alert, awake, oriented x3 Psychiatry: Depressed look Extremities: No pedal edema, no calf tenderness  Data Review: I have personally reviewed the laboratory data and studies available.  Recent Labs  Lab 11/09/20 1250 11/09/20 2215 11/10/20 0040 11/11/20 0111 11/13/20 0328 11/14/20 0119 11/15/20 0149  WBC 28.6*   < > 24.5* 23.7* 13.9* 12.9* 13.4*  NEUTROABS 24.5*  --   --  19.0* 9.4* 8.0* 8.0*  HGB 15.6*   < > 15.4* 14.8 13.8 14.8 13.6  HCT 44.0   < > 42.7 41.3 40.0 40.9  39.2  MCV 85.9   < > 86.1 87.1 87.7 85.9 87.7  PLT 235   < > 223 237 206 200 198   < > = values in this interval not displayed.   Recent Labs  Lab 11/09/20 2215 11/10/20 0040 11/11/20 0111 11/12/20 1436 11/13/20 0328  11/14/20 0119 11/15/20 0149  NA 137   < > 138 134* 137 137 137  K 3.3*   < > 3.5 3.3* 3.8 3.2* 3.4*  CL 104   < > 105 105 105 107 106  CO2 21*   < > 23 21* 23 22 22   GLUCOSE 197*   < > 185* 132* 167* 176* 135*  BUN 23   < > 24* 24* 19 16 17   CREATININE 1.58*   < > 1.63* 1.51* 1.71* 1.54* 1.53*  CALCIUM 11.6*   < > 12.4* 10.5* 11.0* 10.9* 10.9*  MG 2.1  --  2.2  --  2.0  --  1.7  PHOS 2.3*  --  1.8*  --  2.2*  --  2.3*   < > = values in this interval not displayed.    F/u labs ordered.  Signed, Terrilee Croak, MD [facility] 11/15/2020

## 2020-11-16 DIAGNOSIS — R634 Abnormal weight loss: Secondary | ICD-10-CM

## 2020-11-16 LAB — CULTURE, BLOOD (ROUTINE X 2)
Culture: NO GROWTH
Culture: NO GROWTH
Special Requests: ADEQUATE
Special Requests: ADEQUATE

## 2020-11-16 LAB — GLUCOSE, CAPILLARY
Glucose-Capillary: 171 mg/dL — ABNORMAL HIGH (ref 70–99)
Glucose-Capillary: 180 mg/dL — ABNORMAL HIGH (ref 70–99)
Glucose-Capillary: 145 mg/dL — ABNORMAL HIGH (ref 70–99)
Glucose-Capillary: 140 mg/dL — ABNORMAL HIGH (ref 70–99)

## 2020-11-16 MED ORDER — ERYTHROMYCIN ETHYLSUCCINATE 200 MG/5ML PO SUSR
200.0000 mg | Freq: Three times a day (TID) | ORAL | Status: DC
  Administered 2020-11-16 (×2): 200 mg via ORAL
  Filled 2020-11-16 (×6): qty 5

## 2020-11-16 NOTE — Progress Notes (Signed)
Mobility Specialist: Progress Note   11/16/20 1416  Mobility  Activity Ambulated in hall  Level of Assistance Modified independent, requires aide device or extra time  Assistive Device Front wheel walker  Distance Ambulated (ft) 140 ft  Mobility Response Tolerated well  Mobility performed by Mobility specialist  $Mobility charge 1 Mobility    See previous note.   [facility] Anhad Sheeley Mobility Specialist

## 2020-11-16 NOTE — Progress Notes (Addendum)
Mobility Specialist: Progress Note  Pre-Mobility: 64 HR, 125/84 BP Post-Mobility: 66 HR, 126/84 BP  Pt was asx during ambulation. Pt walked to the nurses station and back to room. Pt in bed with call bell at her side.   [facility] Jabrea Kallstrom Mobility Specialist

## 2020-11-16 NOTE — Progress Notes (Addendum)
Daily Rounding Note  11/16/2020, 8:37 AM  LOS: 7 days   SUBJECTIVE:   Chief complaint:  Nausea, vomiting.     N/V have not been issue for at least 48 hours.  No BM yest, small brown stool this AM. Wants to go home.    OBJECTIVE:         Vital signs in last 24 hours:    Temp:  [97.9 F (36.6 C)-98 F (36.7 C)] 98 F (36.7 C) (12/09 0808) Pulse Rate:  [60-62] 60 (12/09 0808) Resp:  [16-20] 16 (12/09 0808) BP: (112-149)/(69-98) 116/69 (12/09 0808) SpO2:  [93 %-99 %] 93 % (12/09 0808) Last BM Date: 11/09/20 Filed Weights   11/09/20 1121 11/09/20 2100  Weight: 74 kg 68.7 kg   General: looks chronically ill but not acutely ill   Heart: RRR Chest: clear bil.   Abdomen: soft,NT, ND.  BS normal quality but reduced frequency.    Extremities: no CCE Neuro/Psych:  Pleasant, cooperative, not confused.    Intake/Output from previous day: 12/08 0701 - 12/09 0700 In: 3591.3 [P.O.:820; I.V.:2044.3; IV Piggyback:727] Out: 1975 [KVQQV:9563]  Intake/Output this shift: No intake/output data recorded.  Lab Results: Recent Labs    11/14/20 0119 11/15/20 0149  WBC 12.9* 13.4*  HGB 14.8 13.6  HCT 40.9 39.2  PLT 200 198   BMET Recent Labs    11/14/20 0119 11/15/20 0149  NA 137 137  K 3.2* 3.4*  CL 107 106  CO2 22 22  GLUCOSE 176* 135*  BUN 16 17  CREATININE 1.54* 1.53*  CALCIUM 10.9* 10.9*    Studies/Results: No results found.   Scheduled Meds: . amLODipine  10 mg Oral Daily  . aspirin EC  81 mg Oral Daily  . buPROPion  150 mg Oral Daily  . carvedilol  12.5 mg Oral BID WC  . cephALEXin  500 mg Oral Q12H  . Chlorhexidine Gluconate Cloth  6 each Topical Daily  . escitalopram  20 mg Oral Daily  . heparin  5,000 Units Subcutaneous Q8H  . hydrALAZINE  25 mg Oral QHS  . influenza vaccine adjuvanted  0.5 mL Intramuscular Tomorrow-1000  . insulin aspart  0-15 Units Subcutaneous TID WC  . insulin aspart  0-5  Units Subcutaneous QHS  . insulin glargine  5 Units Subcutaneous Daily  . lisinopril  20 mg Oral Daily  . oxybutynin  10 mg Oral Daily  . pantoprazole  40 mg Oral Daily  . pneumococcal 23 valent vaccine  0.5 mL Intramuscular Tomorrow-1000  . polyethylene glycol  17 g Oral BID  . rosuvastatin  20 mg Oral Daily  . sodium chloride flush  3 mL Intravenous Q12H  . traZODone  100 mg Oral QHS   Continuous Infusions: . sodium chloride    . dextrose 5 % and 0.9% NaCl 100 mL/hr at 11/16/20 0438  . erythromycin 205 mg (11/16/20 0618)   PRN Meds:.sodium chloride, HYDROcodone-acetaminophen, lip balm, LORazepam, metoCLOPramide (REGLAN) injection, ondansetron (ZOFRAN) IV, sodium chloride flush   ASSESMENT:   *  N/V, weight loss.   Suspect multifactorial gastroparesis, but this is not confirmed.   Erythromycin (scheduled) and Protonix in place   *   Leukocytosis.  Negative CXR and UA/urine clx.  28.6 >> 13.4.   Keflex d 8.  IV Erythromycin d 3 (for gastroparesis) remains in place.      *   Chronic constipation.  In setting chronic narcotics.  Miralax bid in place.    *  CKD 3.    *    S/P cholecystectomy.  Stable mild intrahepatic and moderate extrahepatic biliary duct dilatation.  Pancreatic atrophy.  LFTs, lipase normal.  *   IDDM  *    Hypokalemia, improved but persists.     PLAN   *  From GI view, OK to discharge.  Will switch erythromycin to oral x 2 weeks, then stop.  See orders. Outpatient GI follow up not needed at this time. Follow up with PCP.  GI signing off.     Melissa Lopez  11/16/2020, 8:37 AM Phone [phone]    Attending Physician Note   I have taken a history, examined the patient and reviewed the chart. I agree with the Advanced Practitioner's note, impression and recommendations.   Nausea, vomiting resolved. Weight loss, multifactorial. Erythromycin tid ac for 2 weeks and then stop. Protonix qd ongoing. OK for discharge from GI standpoint.   Chronic  constipation. Miralax bid in place.   GI signing off.    Lucio Edward, MD [facility]

## 2020-11-16 NOTE — Progress Notes (Signed)
PROGRESS NOTE  Melissa Lopez  DOB: Jan 11, 1951  PCP: Celene Squibb, MD WHQ:[id number]  DOA: 11/09/2020  LOS: 7 days   Chief Complaint  Patient presents with  . Emesis   Brief narrative: Melissa Lopez is a 69 y.o. 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 On 12/2, patient presented to the ED at [facility] with complaint of 2 weeks history of worsening nausea, vomiting and diarrhea associated with decreased oral intake.  Patient was admitted in October with similar symptoms with no clear etiology of her intractable nausea and vomiting.  She had multiple negative CT scans.  EGD showed bile reflux suggestive of gastric dysmotility versus reflux gastritis.  Her symptoms resolved and she was discharged.  She did not tolerate gastric emptying studies.  On 11/20, patient returned back to the ED with similar symptoms and left lower quadrant pain.  She was noted to have UTI, started on Keflex and discharged to home. However her symptoms persisted and hence patient presented to the ED again on 12/2.  Marijuana use was also suspected to be one of the possible causes of her symptoms.  Patient states that she has not smoked marijuana for last 1 month.  In the ED, patient's blood pressure was elevated to 140s, otherwise stable. CT scan of abdomen did not show any acute findings. Initial troponin was negative.  However her EKG was concerning for ST elevation in multiple leads. Patient was transported emergently to: Cath Lab and underwent cardiac cath. Hospitalist service was consulted for comanagement of medical issues. Cardiac cath was unremarkable and hence patient was transferred to hospitalist service as the primary team. Despite continued use of scheduled Reglan, and as needed IV Zofran, patient continues to have  significant nausea, vomiting.  GI consult was called and patient was started on IV erythromycin.  Subjective: Patient was seen and examined this morning.  Feels better.  Last vomiting was 36 hours ago.  Has been tolerating clear liquid diet.  Wants to try soft diet today.  Assessment/Plan: Intractable nausea and vomiting Chronic nausea and vomiting -Extensive work-up in October found source likely due to bile reflux gastritis versus diabetic gastroparesis. Has quit THC since that time. -12/7, gastric emptying study was tried today but patient could not complete the study. -GI consultation appreciated.  Patient was started erythromycin IV 3 times daily.  Also reduced dose of Reglan as needed.Encouraged to minimize the use of opiate which could make nausea vomiting worse. -Patient feels better, last vomiting was 36 hours ago.  Wants to try soft food today. -Continue D5 NS at 100/h. -Encourage oral intake.  Advance as tolerated.  Acute chest pain with ST elevation in multiple EKG leads History of abnormal EKG and nonobstructive CAD -Apparently patient has abnormal EKG findings with ST elevation in the past as well.  She underwent cardiac cath in 2019 and had nonobstructive CAD. -This admission, with a presenting symptoms of chest pain and persistent ST elevation on EKG, she underwent cardiac cath again.  She was noted to have distal vessel stenosis, not flow-limiting and not amenable for angioplasty. -Continue Coreg and aspirin.  Chronic diastolic CHF Essential hypertension  -Echo from October 2021 with EF 55 to 60% and grade 1 diastolic dysfunction. -Home medications include Coreg, amlodipine, hydralazine, lisinopril.   -Continue all.  Coreg dose was increased to 12.5 mg daily. -Continue to  monitor blood pressure and heart for symptoms. -As needed labetalol.  History of symptomatic bradycardia status post pacemaker - Per Cardiology   Chronic hypercalcemia Primary  hyperparathyroidism -Calcium 12.2 on presentation.  Gradually improving with IV fluid. -Patient's daughter reports elevated calcium level for last 20 years.  She apparently has a known nodule for work-up in [place].  However parathyroid scan in October 2021 was negative. Recent Labs  Lab 11/11/20 0111 11/12/20 1436 11/13/20 0328 11/14/20 0119 11/15/20 0149  CALCIUM 12.4* 10.5* 11.0* 10.9* 10.9*   Leukocytosis -On arrival, patient had WBC 28 with left shift -Had similarly elevated white count during previous admission for nausea and vomiting, possibly reactive leukocytosis -No signs of infection on chest x-ray, urinalysis or CT abdomen -No fever.  WBC count trending down by self without antibiotics. -unclear why WBC count is slightly up today.  Continue to monitor Recent Labs  Lab 11/09/20 2215 11/10/20 0040 11/11/20 0111 11/13/20 0328 11/14/20 0119 11/15/20 0149  WBC 27.2* 24.5* 23.7* 13.9* 12.9* 13.4*  LATICACIDVEN 1.2  --   --   --   --   --    CKD 3b -Creatinine at baseline. Recent Labs    10/10/20 1504 10/28/20 1838 11/09/20 1250 11/09/20 2215 11/10/20 0040 11/11/20 0111 11/12/20 1436 11/13/20 0328 11/14/20 0119 11/15/20 0149  BUN 23 25* 31* 23 24* 24* 24* 19 16 17   CREATININE 1.83* 1.98* 1.76* 1.58* 1.51* 1.63* 1.51* 1.71* 1.54* 1.53*   Hypokalemia/hypophosphatemia/hypomagnesemia -Potassium, magnesium and phosphorus levels are being monitored.  Repeat labs tomorrow.   Recent Labs  Lab 11/09/20 2215 11/10/20 0040 11/11/20 0111 11/12/20 1436 11/13/20 0328 11/14/20 0119 11/15/20 0149  K 3.3*   < > 3.5 3.3* 3.8 3.2* 3.4*  MG 2.1  --  2.2  --  2.0  --  1.7  PHOS 2.3*  --  1.8*  --  2.2*  --  2.3*   < > = values in this interval not displayed.   Type 2 diabetes mellitus Hyperglycemia -Last A1c 6.5 on 10/17. -On Tresiba 5 units daily at home. -Currently on dextrose drip because of poor oral intake. -Blood sugar control with Lantus 5 units daily  and sliding scale insulin.  Continue the same. -I would slow down on dextrose drip today. Recent Labs  Lab 11/15/20 0607 11/15/20 1141 11/15/20 1627 11/15/20 2142 11/16/20 0651  GLUCAP 139* 227* 125* 129* 171*   Anxiety and depression - Continue home bupropion, escitalopram, trazodone, as needed lorazepam  Mobility: Uses walker at home.   Code Status:   Code Status: Full Code  Nutritional status: Body mass index is 26 kg/m.     Diet Order            Diet Carb Modified Fluid consistency: Thin; Room service appropriate? Yes  Diet effective now                 DVT prophylaxis: heparin injection 5,000 Units Start: 11/10/20 0600   Antimicrobials:  None Fluid: Continue D5 NS at 50 mill per hour Family Communication:  None at bedside   Infusions:  . sodium chloride    . dextrose 5 % and 0.9% NaCl 100 mL/hr at 11/16/20 0438  . erythromycin 205 mg (11/16/20 0618)    Scheduled Meds: . amLODipine  10 mg Oral Daily  . aspirin EC  81 mg Oral Daily  . buPROPion  150 mg Oral Daily  . carvedilol  12.5 mg Oral BID WC  . cephALEXin  500 mg Oral  Q12H  . Chlorhexidine Gluconate Cloth  6 each Topical Daily  . escitalopram  20 mg Oral Daily  . heparin  5,000 Units Subcutaneous Q8H  . hydrALAZINE  25 mg Oral QHS  . influenza vaccine adjuvanted  0.5 mL Intramuscular Tomorrow-1000  . insulin aspart  0-15 Units Subcutaneous TID WC  . insulin aspart  0-5 Units Subcutaneous QHS  . insulin glargine  5 Units Subcutaneous Daily  . lisinopril  20 mg Oral Daily  . oxybutynin  10 mg Oral Daily  . pantoprazole  40 mg Oral Daily  . pneumococcal 23 valent vaccine  0.5 mL Intramuscular Tomorrow-1000  . polyethylene glycol  17 g Oral BID  . rosuvastatin  20 mg Oral Daily  . sodium chloride flush  3 mL Intravenous Q12H  . traZODone  100 mg Oral QHS    Antimicrobials: Anti-infectives (From admission, onward)   Start     Dose/Rate Route Frequency Ordered Stop   11/15/20 0800   erythromycin 250 mg in sodium chloride 0.9 % 100 mL IVPB  Status:  Discontinued        250 mg 100 mL/hr over 60 Minutes Intravenous 3 times daily before meals 11/14/20 1703 11/14/20 1711   11/14/20 1900  erythromycin 205 mg in sodium chloride 0.9 % 100 mL IVPB        3 mg/kg  68.7 kg 100 mL/hr over 60 Minutes Intravenous Every 8 hours 11/14/20 1711     11/09/20 2215  cephALEXin (KEFLEX) capsule 500 mg        500 mg Oral Every 12 hours 11/09/20 2144        PRN meds: sodium chloride, HYDROcodone-acetaminophen, lip balm, LORazepam, metoCLOPramide (REGLAN) injection, ondansetron (ZOFRAN) IV, sodium chloride flush   Objective: Vitals:   11/16/20 0447 11/16/20 0808  BP: (!) 143/88 116/69  Pulse: 61 60  Resp: 18 16  Temp: 97.9 F (36.6 C) 98 F (36.7 C)  SpO2: 98% 93%    Intake/Output Summary (Last 24 hours) at 11/16/2020 1051 Last data filed at 11/16/2020 0924 Gross per 24 hour  Intake 3591.3 ml  Output 1775 ml  Net 1816.3 ml   Filed Weights   11/09/20 1121 11/09/20 2100  Weight: 74 kg 68.7 kg   Weight change:  Body mass index is 26 kg/m.   Physical Exam: General exam: Not in physical distress.  Feels better this morning. skin: No rashes, lesions or ulcers. HEENT: Atraumatic, normocephalic, no obvious bleeding Lungs: Clear to auscultation bilaterally CVS: Regular rate and rhythm, no murmur GI/Abd soft, mild epigastric tenderness present, nondistended, bowel sound present CNS: Alert, awake, oriented x3 Psychiatry: Mood appropriate Extremities: No pedal edema, no calf tenderness  Data Review: I have personally reviewed the laboratory data and studies available.  Recent Labs  Lab 11/09/20 1250 11/09/20 2215 11/10/20 0040 11/11/20 0111 11/13/20 0328 11/14/20 0119 11/15/20 0149  WBC 28.6*   < > 24.5* 23.7* 13.9* 12.9* 13.4*  NEUTROABS 24.5*  --   --  19.0* 9.4* 8.0* 8.0*  HGB 15.6*   < > 15.4* 14.8 13.8 14.8 13.6  HCT 44.0   < > 42.7 41.3 40.0 40.9 39.2  MCV  85.9   < > 86.1 87.1 87.7 85.9 87.7  PLT 235   < > 223 237 206 200 198   < > = values in this interval not displayed.   Recent Labs  Lab 11/09/20 2215 11/10/20 0040 11/11/20 0111 11/12/20 1436 11/13/20 0328 11/14/20 0119 11/15/20 0149  NA 137   < >  138 134* 137 137 137  K 3.3*   < > 3.5 3.3* 3.8 3.2* 3.4*  CL 104   < > 105 105 105 107 106  CO2 21*   < > 23 21* 23 22 22   GLUCOSE 197*   < > 185* 132* 167* 176* 135*  BUN 23   < > 24* 24* 19 16 17   CREATININE 1.58*   < > 1.63* 1.51* 1.71* 1.54* 1.53*  CALCIUM 11.6*   < > 12.4* 10.5* 11.0* 10.9* 10.9*  MG 2.1  --  2.2  --  2.0  --  1.7  PHOS 2.3*  --  1.8*  --  2.2*  --  2.3*   < > = values in this interval not displayed.    F/u labs ordered.  Signed, Terrilee Croak, MD [facility] 11/16/2020

## 2020-11-17 LAB — BASIC METABOLIC PANEL
Anion gap: 9 (ref 5–15)
BUN: 10 mg/dL (ref 8–23)
CO2: 21 mmol/L — ABNORMAL LOW (ref 22–32)
Calcium: 11.2 mg/dL — ABNORMAL HIGH (ref 8.9–10.3)
Chloride: 105 mmol/L (ref 98–111)
Creatinine, Ser: 1.54 mg/dL — ABNORMAL HIGH (ref 0.44–1.00)
GFR, Estimated: 36 mL/min — ABNORMAL LOW (ref >60)
Glucose, Bld: 125 mg/dL — ABNORMAL HIGH (ref 70–99)
Potassium: 3.2 mmol/L — ABNORMAL LOW (ref 3.5–5.1)
Sodium: 135 mmol/L (ref 135–145)

## 2020-11-17 LAB — GLUCOSE, CAPILLARY
Glucose-Capillary: 167 mg/dL — ABNORMAL HIGH (ref 70–99)
Glucose-Capillary: 155 mg/dL — ABNORMAL HIGH (ref 70–99)

## 2020-11-17 LAB — MAGNESIUM: Magnesium: 2.1 mg/dL (ref 1.7–2.4)

## 2020-11-17 LAB — PHOSPHORUS: Phosphorus: 2.4 mg/dL — ABNORMAL LOW (ref 2.5–4.6)

## 2020-11-17 MED ORDER — K PHOS MONO-SOD PHOS DI & MONO 155-852-130 MG PO TABS
500.0000 mg | ORAL_TABLET | ORAL | Status: AC
  Filled 2020-11-17 (×2): qty 2

## 2020-11-17 MED ORDER — METOCLOPRAMIDE HCL 5 MG PO TABS: 5.0000 mg | ORAL_TABLET | Freq: Three times a day (TID) | ORAL | 0 refills | Status: DC

## 2020-11-17 MED ORDER — ERYTHROMYCIN ETHYLSUCCINATE 200 MG/5ML PO SUSR: 200.0000 mg | Freq: Three times a day (TID) | ORAL | 0 refills | Status: AC

## 2020-11-17 MED ORDER — CARVEDILOL 12.5 MG PO TABS: 12.5000 mg | ORAL_TABLET | Freq: Two times a day (BID) | ORAL | 2 refills | Status: DC

## 2020-11-17 NOTE — Consult Note (Signed)
   [facility] CM Inpatient Consult   11/17/2020  Melissa Lopez [birth year] [id number]   Patient chart reviewed for potential [facility] CM) services due to high unplanned readmission risk score. Per chart review, [facility] CM care coordinator spoke with patient's daughter in October 2021, explaining the [facility] CM services and assessed for chronic care management needs. Services were declined at that time.  Patient will receive EMMI discharge calls upon transition home.   Of note, [facility] Care Management services does not replace or interfere with any services that are arranged by inpatient case management or social work.  Netta Cedars, MSN, [facility] Liaison Nurse Mobile Phone [phone]  Toll free office [phone]

## 2020-11-17 NOTE — Discharge Summary (Signed)
Physician Discharge Summary  Melissa Lopez JME:[id number] DOB: 01/30/1951 DOA: 11/09/2020  PCP: Celene Squibb, MD  Admit date: 11/09/2020 Discharge date: 11/17/2020  Admitted From: Home Discharge disposition: Home   Code Status: Full Code  Diet Recommendation: Soft diet for next few days before advancing to regular consistency diet with calorie and salt restriction  Discharge Diagnosis:   Principal Problem:   Intractable nausea and vomiting Active Problems:   H/O cardiac pacemaker   ST elevation on EKG without MI   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[place])   Type 2 diabetes mellitus with complication, with long-term current use of insulin ([place])   CKD (chronic kidney disease) stage 3, GFR 30-59 ml/min (HCC) - per patient report   Essential hypertension   Hypercalcemia   Hypokalemia   Chronic diastolic CHF (congestive heart failure) (HCC)   Hyperparathyroidism, primary ([place])   Chest pain of uncertain etiology   Primary hypertension   Loss of weight   History of Present Illness / Brief narrative:  Melissa Lopez is a 69 y.o. 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 On 12/2, patient presented to the ED at [facility] with complaint of 2 weeks history of worsening nausea, vomiting and diarrhea associated with decreased oral intake.  Patient was admitted in October with similar symptoms with no clear etiology of her intractable nausea and vomiting.  She had multiple negative CT scans.  EGD showed bile reflux suggestive of gastric dysmotility versus reflux gastritis.  Her symptoms resolved and she was discharged.  She did not tolerate gastric emptying studies.  On 11/20, patient returned back to the ED with similar symptoms and left lower quadrant pain.  She was noted to have UTI, started on Keflex  and discharged to home. However her symptoms persisted and hence patient presented to the ED again on 12/2.  Marijuana use was also suspected to be one of the possible causes of her symptoms.  Patient states that she has not smoked marijuana for last 1 month.  In the ED, patient's blood pressure was elevated to 140s, otherwise stable. CT scan of abdomen did not show any acute findings. Initial troponin was negative.  However her EKG was concerning for ST elevation in multiple leads. Patient was transported emergently to: Cath Lab and underwent cardiac cath. Hospitalist service was consulted for comanagement of medical issues. Cardiac cath was unremarkable and hence patient was transferred to hospitalist service as the primary team. Despite continued use of scheduled Reglan, and as needed IV Zofran, patient continues to have significant nausea, vomiting.  GI consult was called and patient was started on IV erythromycin. Patient has had relief with erythromycin and is ready to go home.  Subjective:  Seen and examined this morning. She wants to go home.  Apparently she did not tell me that she had vomited this morning was I later on learned from the nurse.  This is the first time she vomited in 48 hours. Overall she feels better than presentation.  Hospital Course:  Intractable nausea and vomiting Chronic nausea and vomiting -Extensive work-up in October found source likely due to bile reflux gastritis versus diabetic gastroparesis.Has quit THC since that time. -12/7, gastric emptying study was tried today but patient could not complete the study. -GI consultation appreciated.  Patient was started erythromycin IV 3 times daily. -She feels better overall.  She  threw up 1 time this morning in 48 hours  but still wants to go home.   -Per GI recommendation, will discharge on oral erythromycin for 2 weeks and continue as needed Reglan and Zofran.   -Adequately hydrated. -At home, recommend to  stay on soft diet for next few days before advancing to regular consistency diet with calorie and salt restriction  Acute chest pain with ST elevation in multiple EKG leads History of abnormal EKG and nonobstructive CAD -Apparently patient has abnormal EKG findings with ST elevation in the past as well.  She underwent cardiac cath in 2019 and had nonobstructive CAD. -This admission, with a presenting symptoms of chest pain and persistent ST elevation on EKG, she underwent cardiac cath again.  She was noted to have distal vessel stenosis, not flow-limiting and not amenable for angioplasty. -Continue Coreg and aspirin.  Chronic diastolic CHF Essential hypertension  -Echo from October 2021 with EF 55 to 60% and grade 1 diastolic dysfunction. -Home medications include Coreg, amlodipine, hydralazine, lisinopril.   -Continue all.  Coreg dose was increased to 12.5 mg daily. -Prescription given for increased dose of Coreg.  History of symptomatic bradycardia status post pacemaker - Per Cardiology   Chronic hypercalcemia Primary hyperparathyroidism -Calcium 12.2 on presentation.  Gradually improving with IV fluid. -Patient's daughter reports elevated calcium level for last 20 years.  She apparently has a known nodule for work-up in [place].  However parathyroid scan in October 2021 was negative. -Calcium continues to run slightly higher than normal range which is at baseline. Recent Labs  Lab 11/12/20 1436 11/13/20 0328 11/14/20 0119 11/15/20 0149 11/17/20 0130  CALCIUM 10.5* 11.0* 10.9* 10.9* 11.2*   Leukocytosis -On arrival, patient had WBC 28 with left shift -Had similarlyelevated WBC count during previous admission for nausea and vomiting, possibly reactive leukocytosis. -No signs of infection on chest x-ray,urinalysis or CT abdomen -WBC improving gradually. Recent Labs  Lab 11/11/20 0111 11/13/20 0328 11/14/20 0119 11/15/20 0149  WBC 23.7* 13.9* 12.9* 13.4*   CKD  3b -Creatinine at baseline. Recent Labs    10/28/20 1838 11/09/20 1250 11/09/20 2215 11/10/20 0040 11/11/20 0111 11/12/20 1436 11/13/20 0328 11/14/20 0119 11/15/20 0149 11/17/20 0130  BUN 25* 31* 23 24* 24* 24* 19 16 17 10   CREATININE 1.98* 1.76* 1.58* 1.51* 1.63* 1.51* 1.71* 1.54* 1.53* 1.54*   Hypokalemia/hypophosphatemia/hypomagnesemia -Potassium, magnesium and phosphorus levels are being monitored.  -Replacement given this morning for low potassium and phosphorus. Recent Labs  Lab 11/11/20 0111 11/12/20 1436 11/13/20 0328 11/14/20 0119 11/15/20 0149 11/17/20 0130  K 3.5 3.3* 3.8 3.2* 3.4* 3.2*  MG 2.2  --  2.0  --  1.7 2.1  PHOS 1.8*  --  2.2*  --  2.3* 2.4*   Type 2 diabetes mellitus Hyperglycemia -Last A1c 6.5 on 10/17. -OnTresiba 5 units daily at home. -Resume the same post discharge. Recent Labs  Lab 11/16/20 0651 11/16/20 1134 11/16/20 1644 11/16/20 2128 11/17/20 0529  GLUCAP 171* 180* 145* 140* 167*   Anxiety and depression -Continue home bupropion, escitalopram, trazodone, as needed lorazepam  Stable for discharge to home today.  Wound care: Incision (Closed) 10/31/18 Groin Right (Active)  Date First Assessed/Time First Assessed: 10/31/18 1600   Location: Groin  Location Orientation: Right  Present on Admission: No    Assessments 10/31/2018  4:00 PM 11/03/2018  2:00 PM  Dressing Type Gauze (Comment);Transparent dressing Gauze (Comment)  Dressing Clean;Dry;Intact Clean;Dry;Intact  Site / Wound Assessment Dressing in place / Unable to assess --  Closure Other (Comment) --     No Linked orders to display    Discharge Exam:   Vitals:   11/16/20 1927 11/16/20 2304 11/17/20 0300 11/17/20 0936  BP: (!) 116/58 130/72 (!) 156/84 (!) 147/88  Pulse: 68 68 62 60  Resp: 20 18 18 18   Temp: 97.9 F (36.6 C) 98.7 F (37.1 C) 98.7 F (37.1 C) 97.8 F (36.6 C)  TempSrc: Oral Oral Oral Oral  SpO2: 98% 97% 97% 98%  Weight:      Height:         Body mass index is 26 kg/m.  General exam: Pleasant, elderly.  Not in distress.  Feels ready to go home Skin: No rashes, lesions or ulcers. HEENT: Atraumatic, normocephalic, no obvious bleeding Lungs: Clear to auscultation bilaterally CVS: Regular rate and rhythm, no murmur GI/Abd soft, nontender, nondistended, bowel sound present CNS: Alert, awake monitor x3 Psychiatry: Mood appropriate Extremities: No pedal edema, no calf tenderness  Follow ups:   Discharge Instructions    Diet general   Complete by: As directed    Soft diet for next few days before advancing to regular consistency diet with calorie and salt restriction   Increase activity slowly   Complete by: As directed       Follow-up Information    Verta Ellen., NP Follow up.   Specialty: Cardiology Why: [place] office - Tuesday Nov 28, 2020 10:30 AM (Arrive by 10:15 AM). Jonni Sanger is one of the nurse practitioners that works with our team. Contact information: [place] [address] [phone]        Celene Squibb, MD Follow up.   Specialty: Internal Medicine Contact information: [address] [place] [facility] [address] [phone]        Thompson Grayer, MD .   Specialty: Cardiology Contact information: [place] [address] [phone]        Arnoldo Lenis, MD .   Specialty: Cardiology Contact information: [address] [place] [address] [phone]        Ladene Artist, MD Follow up.   Specialty: Gastroenterology Contact information: [address] [place] [address] [phone]               Recommendations for Outpatient Follow-Up:   1. Follow-up with PCP as an outpatient 2. Follow-up with GI as an outpatient  Discharge Instructions:  Follow with Primary MD Celene Squibb, MD in 7 days   Get CBC/BMP checked in next visit within 1 week by PCP or SNF MD ( we routinely change or  add medications that can affect your baseline labs and fluid status, therefore we recommend that you get the mentioned basic workup next visit with your PCP, your PCP may decide not to get them or add new tests based on their clinical decision)  On your next visit with your PCP, please Get Medicines reviewed and adjusted.  Please request your PCP  to go over all Hospital Tests and Procedure/Radiological results at the follow up, please get all Hospital records sent to your Prim MD by signing hospital release before you go home.  Activity: As tolerated with Full fall precautions use walker/cane & assistance as needed  For Heart failure patients - Check your Weight same time everyday, if you gain over 2 pounds, or you develop in leg swelling, experience more shortness of breath or chest pain, call your Primary  MD immediately. Follow Cardiac Low Salt Diet and 1.5 lit/day fluid restriction.  If you have smoked or chewed Tobacco in the last 2 yrs please stop smoking, stop any regular Alcohol  and or any Recreational drug use.  If you experience worsening of your admission symptoms, develop shortness of breath, life threatening emergency, suicidal or homicidal thoughts you must seek medical attention immediately by calling 911 or calling your MD immediately  if symptoms less severe.  You Must read complete instructions/literature along with all the possible adverse reactions/side effects for all the Medicines you take and that have been prescribed to you. Take any new Medicines after you have completely understood and accpet all the possible adverse reactions/side effects.   Do not drive, operate heavy machinery, perform activities at heights, swimming or participation in water activities or provide baby sitting services if your were admitted for syncope or siezures until you have seen by Primary MD or a Neurologist and advised to do so again.  Do not drive when taking Pain medications.  Do not take more  than prescribed Pain, Sleep and Anxiety Medications  Wear Seat belts while driving.   Please note You were cared for by a hospitalist during your hospital stay. If you have any questions about your discharge medications or the care you received while you were in the hospital after you are discharged, you can call the unit and asked to speak with the hospitalist on call if the hospitalist that took care of you is not available. Once you are discharged, your primary care physician will handle any further medical issues. Please note that NO REFILLS for any discharge medications will be authorized once you are discharged, as it is imperative that you return to your primary care physician (or establish a relationship with a primary care physician if you do not have one) for your aftercare needs so that they can reassess your need for medications and monitor your lab values.    Allergies as of 11/17/2020      Reactions   Contrast Media [iodinated Diagnostic Agents] Swelling   Contrast Dye - the one made out of shellfish, gives a really bad reaction - swelling   Oxycodone-acetaminophen Anaphylaxis   Xarelto  [rivaroxaban] Swelling   Oxycodone Other (See Comments)   Statused by Person:  EAST, CAMELIA(CE) on [phone]      Medication List    STOP taking these medications   cephALEXin 250 MG capsule Commonly known as: KEFLEX     TAKE these medications   (feeding supplement) PROSource Plus liquid Take 30 mLs by mouth daily.   acetaminophen 650 MG CR tablet Commonly known as: TYLENOL Take 1,300 mg by mouth every 8 (eight) hours as needed for pain.   amLODipine 10 MG tablet Commonly known as: NORVASC TAKE 1 TABLET BY MOUTH EVERY DAY   aspirin 81 MG chewable tablet Chew 1 tablet (81 mg total) by mouth daily.   buPROPion 150 MG 12 hr tablet Commonly known as: WELLBUTRIN SR Take 150 mg by mouth daily.   carvedilol 12.5 MG tablet Commonly known as: COREG Take 1 tablet (12.5 mg  total) by mouth 2 (two) times daily with a meal. What changed:   medication strength  how much to take  when to take this   erythromycin ethylsuccinate 200 MG/5ML suspension Commonly known as: EES Take 5 mLs (200 mg total) by mouth 3 (three) times daily for 14 days.   escitalopram 20 MG tablet Commonly known as: LEXAPRO Take 20  mg by mouth daily.   gabapentin 100 MG capsule Commonly known as: NEURONTIN Take 100 mg by mouth 3 (three) times daily.   hydrALAZINE 25 MG tablet Commonly known as: APRESOLINE Take 25 mg by mouth at bedtime.   lisinopril 20 MG tablet Commonly known as: ZESTRIL Take 1 tablet (20 mg total) by mouth daily.   LORazepam 1 MG tablet Commonly known as: ATIVAN Take 1 mg by mouth daily as needed for anxiety.   metoCLOPramide 5 MG tablet Commonly known as: Reglan Take 1 tablet (5 mg total) by mouth 3 (three) times daily for 14 days.   multivitamin with minerals Tabs tablet Take 1 tablet by mouth daily.   omeprazole 20 MG capsule Commonly known as: PRILOSEC Take 20 mg by mouth daily.   ondansetron 4 MG tablet Commonly known as: ZOFRAN Take 1 tablet (4 mg total) by mouth every 6 (six) hours.   oxybutynin 10 MG 24 hr tablet Commonly known as: DITROPAN-XL Take 10 mg by mouth daily.   polyethylene glycol 17 g packet Commonly known as: MIRALAX / GLYCOLAX Take 17 g by mouth 2 (two) times daily.   rosuvastatin 5 MG tablet Commonly known as: CRESTOR Take one tablet daily   tiZANidine 2 MG tablet Commonly known as: ZANAFLEX Take 2 mg by mouth 3 (three) times daily.   traZODone 100 MG tablet Commonly known as: DESYREL Take 100 mg by mouth at bedtime.   Tyler Aas FlexTouch 100 UNIT/ML FlexTouch Pen Generic drug: insulin degludec Inject 5 Units into the skin daily.       Time coordinating discharge: 35 minutes  The results of significant diagnostics from this hospitalization (including imaging, microbiology, ancillary and laboratory) are  listed below for reference.    Procedures and Diagnostic Studies:   CT ABDOMEN PELVIS WO CONTRAST  Result Date: 11/09/2020 CLINICAL DATA:  Abdominal pain, nausea, vomiting and diarrhea for 3 days. EXAM: CT ABDOMEN AND PELVIS WITHOUT CONTRAST TECHNIQUE: Multidetector CT imaging of the abdomen and pelvis was performed following the standard protocol without IV contrast. COMPARISON:  10/28/2020 FINDINGS: Lower chest: Subpleural dependent atelectasis/edema mainly on the right side. No infiltrates or effusions. The heart is normal in size. No pericardial effusion. Moderate aortic calcifications. Hepatobiliary: No focal hepatic lesions are identified without contrast. The gallbladder is surgically absent. Stable mild intrahepatic and moderate extrahepatic biliary dilatation. The common bile duct measures approximately 16 mm in the porta hepatis and a maximum of 10 mm in the head of the pancreas. Pancreas: No mass, inflammation or ductal dilatation. Mild diffuse pancreatic atrophy. Spleen: Normal size.  No focal lesions. Adrenals/Urinary Tract: The adrenal glands are unremarkable. Stable numerous bilateral renal cyst several of which have rim like calcifications. There also speckled parenchymal calcifications throughout both kidneys. No obvious worrisome renal mass or hydroureteronephrosis. The bladder is unremarkable. Stomach/Bowel: The stomach, duodenum, small bowel and colon are grossly normal without oral contrast. No acute inflammatory changes, mass lesions or obstructive findings. Scattered colonic diverticulosis without findings for acute diverticulitis. Vascular/Lymphatic: Stable moderate atherosclerotic calcifications involving the aorta and iliac arteries without focal aneurysm. No mesenteric or retroperitoneal mass or adenopathy. Reproductive: Surgically absent. Other: No pelvic mass or adenopathy. No free pelvic fluid collections. No inguinal mass or adenopathy. No abdominal wall hernia or subcutaneous  lesions. Musculoskeletal: Stable degenerative changes involving the spine and hips. No worrisome bone lesions. Moderate calcific tendinitis involving the hamstring tendons. There is also chondrocalcinosis noted at the hip joints and the pubic symphysis. IMPRESSION: 1. No acute abdominal/pelvic  findings, mass lesions or adenopathy. 2. Stable numerous bilateral renal cysts. 3. Status post cholecystectomy with stable mild intrahepatic and moderate extrahepatic biliary dilatation. 4. Aortic atherosclerosis. Aortic Atherosclerosis (ICD10-I70.0). Electronically Signed   By: Marijo Sanes M.D.   On: 11/09/2020 16:40   CARDIAC CATHETERIZATION  Result Date: 11/09/2020  1st Mrg lesion is 100% stenosed.  Ost Ramus lesion is 65% stenosed.  Ost LAD to Prox LAD lesion is 15% stenosed with 60% stenosed side branch in Ost 1st Diag.  Ost Cx to Prox Cx lesion is 45% stenosed.  Diffuse mild CAD and very tortuous vessels. No true occlusive lesions. None  SUMMARY  Stable nonocclusive mild to moderate disease with no culprit lesion for ST elevation MI. ->  The true diagnosis is no longer STEMI -> very similar findings with that very similar presentation 2 years ago.  EF not assessed due to plan to conserve contrast.  Relatively low LVEDP-IV given 500 mL bolus. PLAN  Due to bed availability, she will be admitted to CT ICU, but could be transferred if necessary  We will consult [place] medicine service to assist with her severely elevated white blood cell count, already on antibiotics for possible UTI as well as profound nausea vomiting and hyperglycemia. ->  Concern for possible HONK (hyperosmotic nonketotic ketoacidosis Glenetta Hew, MD   DG Chest Port 1 View  Result Date: 11/09/2020 CLINICAL DATA:  Pain. Additional history provided: Patient reports vomiting and diarrhea for 3 days. EXAM: PORTABLE CHEST 1 VIEW COMPARISON:  CT abdomen/pelvis performed earlier the same day 11/09/2020. Chest radiographs 09/25/2020. FINDINGS:  Left chest dual lead implantable cardiac device. Cardiac monitoring leads overlie the chest. Unchanged cardiomegaly. Aortic atherosclerosis. No appreciable airspace consolidation or pulmonary edema. No evidence of pleural effusion or pneumothorax. No acute bony abnormality identified. IMPRESSION: No evidence of acute cardiopulmonary abnormality. Unchanged cardiomegaly. Aortic Atherosclerosis (ICD10-I70.0). Electronically Signed   By: Kellie Simmering DO   On: 11/09/2020 19:04   ECHOCARDIOGRAM LIMITED  Result Date: 11/10/2020    ECHOCARDIOGRAM LIMITED REPORT   Patient Name:   Melissa Lopez Date of Exam: 11/10/2020 Medical Rec #:  [id number]        Height:       64.0 in Accession #:    [id number]       Weight:       151.5 lb Date of Birth:  Jul 25, 1951        BSA:          1.738 m Patient Age:    69 years         BP:           159/102 mmHg Patient Gender: F                HR:           71 bpm. Exam Location:  Inpatient Procedure: Limited Echo, Cardiac Doppler, Color Doppler and Intracardiac            Opacification Agent Indications:    Chest Pain 786.50 / R07.9  History:        Patient has prior history of Echocardiogram examinations, most                 recent 09/14/2020. CAD, Pacemaker; Risk Factors:Hypertension and                 Former Smoker.  Sonographer:    Vickie Epley RDCS Referring Phys: [place]  1. Left ventricular ejection fraction, by  estimation, is 60 to 65%. The left ventricle has normal function. The left ventricle has no regional wall motion abnormalities. Left ventricular diastolic parameters are consistent with Grade I diastolic dysfunction (impaired relaxation).  2. Right ventricular systolic function is normal. The right ventricular size is normal. There is normal pulmonary artery systolic pressure.  3. The mitral valve was not well visualized. No evidence of mitral valve regurgitation. No evidence of mitral stenosis.  4. There is mild thickening of the aortic valve.  Aortic valve regurgitation is not visualized. No aortic stenosis is present.  5. Aortic dilatation noted. There is moderate dilatation of the ascending aorta, measuring 41 mm. FINDINGS  Left Ventricle: Left ventricular ejection fraction, by estimation, is 60 to 65%. The left ventricle has normal function. The left ventricle has no regional wall motion abnormalities. Definity contrast agent was given IV to delineate the left ventricular  endocardial borders. The left ventricular internal cavity size was normal in size. Left ventricular diastolic parameters are consistent with Grade I diastolic dysfunction (impaired relaxation). Right Ventricle: The right ventricular size is normal. No increase in right ventricular wall thickness. Right ventricular systolic function is normal. There is normal pulmonary artery systolic pressure. The tricuspid regurgitant velocity is 2.49 m/s, and  with an assumed right atrial pressure of 3 mmHg, the estimated right ventricular systolic pressure is 50.3 mmHg. Left Atrium: Left atrial size was normal in size. Right Atrium: Right atrial size was normal in size. Pericardium: There is no evidence of pericardial effusion. Mitral Valve: The mitral valve was not well visualized. No evidence of mitral valve stenosis. Tricuspid Valve: The tricuspid valve is normal in structure. Tricuspid valve regurgitation is mild. Aortic Valve: There is mild thickening of the aortic valve. Aortic valve regurgitation is not visualized. No aortic stenosis is present. Pulmonic Valve: The pulmonic valve was normal in structure. Pulmonic valve regurgitation is trivial. Aorta: Aortic dilatation noted. There is moderate dilatation of the ascending aorta, measuring 41 mm. IAS/Shunts: The atrial septum is grossly normal. LEFT VENTRICLE PLAX 2D LVIDd:         3.90 cm  Diastology LVIDs:         2.80 cm  LV e' medial:    2.06 cm/s LV PW:         1.20 cm  LV E/e' medial:  19.1 LV IVS:        1.20 cm  LV e' lateral:   4.88  cm/s LVOT diam:     1.80 cm  LV E/e' lateral: 8.1 LV SV:         46 LV SV Index:   26 LVOT Area:     2.54 cm  LEFT ATRIUM         Index LA diam:    2.80 cm 1.61 cm/m  AORTIC VALVE LVOT Vmax:   106.00 cm/s LVOT Vmean:  68.700 cm/s LVOT VTI:    0.180 m  AORTA Ao Root diam: 3.70 cm Ao Asc diam:  4.10 cm MITRAL VALVE               TRICUSPID VALVE MV Area (PHT): 2.24 cm    TR Peak grad:   24.8 mmHg MV Decel Time: 338 msec    TR Vmax:        249.00 cm/s MV E velocity: 39.40 cm/s MV A velocity: 85.70 cm/s  SHUNTS MV E/A ratio:  0.46        Systemic VTI:  0.18 m  Systemic Diam: 1.80 cm Mertie Moores MD Electronically signed by Mertie Moores MD Signature Date/Time: 11/10/2020/2:56:41 PM    Final      Labs:   Basic Metabolic Panel: Recent Labs  Lab 11/11/20 0111 11/12/20 1436 11/13/20 0328 11/14/20 0119 11/15/20 0149 11/17/20 0130  NA 138 134* 137 137 137 135  K 3.5 3.3* 3.8 3.2* 3.4* 3.2*  CL 105 105 105 107 106 105  CO2 23 21* 23 22 22  21*  GLUCOSE 185* 132* 167* 176* 135* 125*  BUN 24* 24* 19 16 17 10   CREATININE 1.63* 1.51* 1.71* 1.54* 1.53* 1.54*  CALCIUM 12.4* 10.5* 11.0* 10.9* 10.9* 11.2*  MG 2.2  --  2.0  --  1.7 2.1  PHOS 1.8*  --  2.2*  --  2.3* 2.4*   GFR Estimated Creatinine Clearance: 32.8 mL/min (A) (by C-G formula based on SCr of 1.54 mg/dL (H)). Liver Function Tests: No results for input(s): AST, ALT, ALKPHOS, BILITOT, PROT, ALBUMIN in the last 168 hours. No results for input(s): LIPASE, AMYLASE in the last 168 hours. No results for input(s): AMMONIA in the last 168 hours. Coagulation profile No results for input(s): INR, PROTIME in the last 168 hours.  CBC: Recent Labs  Lab 11/11/20 0111 11/13/20 0328 11/14/20 0119 11/15/20 0149  WBC 23.7* 13.9* 12.9* 13.4*  NEUTROABS 19.0* 9.4* 8.0* 8.0*  HGB 14.8 13.8 14.8 13.6  HCT 41.3 40.0 40.9 39.2  MCV 87.1 87.7 85.9 87.7  PLT 237 206 200 198   Cardiac Enzymes: No results for input(s):  CKTOTAL, CKMB, CKMBINDEX, TROPONINI in the last 168 hours. BNP: Invalid input(s): POCBNP CBG: Recent Labs  Lab 11/16/20 0651 11/16/20 1134 11/16/20 1644 11/16/20 2128 11/17/20 0529  GLUCAP 171* 180* 145* 140* 167*   D-Dimer No results for input(s): DDIMER in the last 72 hours. Hgb A1c No results for input(s): HGBA1C in the last 72 hours. Lipid Profile No results for input(s): CHOL, HDL, LDLCALC, TRIG, CHOLHDL, LDLDIRECT in the last 72 hours. Thyroid function studies No results for input(s): TSH, T4TOTAL, T3FREE, THYROIDAB in the last 72 hours.  Invalid input(s): FREET3 Anemia work up No results for input(s): VITAMINB12, FOLATE, FERRITIN, TIBC, IRON, RETICCTPCT in the last 72 hours. Microbiology Recent Results (from the past 240 hour(s))  Culture, blood (routine x 2)     Status: None   Collection Time: 11/09/20 12:51 PM   Specimen: BLOOD  Result Value Ref Range Status   Specimen Description BLOOD BLOOD LEFT HAND  Final   Special Requests   Final    BOTTLES DRAWN AEROBIC ONLY Blood Culture adequate volume   Culture   Final    NO GROWTH 7 DAYS Performed at [facility], [address] [place], [place] [address]    Report Status 11/16/2020 FINAL  Final  Culture, blood (routine x 2)     Status: None   Collection Time: 11/09/20  1:17 PM   Specimen: BLOOD  Result Value Ref Range Status   Specimen Description BLOOD RIGHT HAND  Final   Special Requests   Final    BOTTLES DRAWN AEROBIC ONLY Blood Culture adequate volume   Culture   Final    NO GROWTH 7 DAYS Performed at [facility], [address] [place], [place] [address]    Report Status 11/16/2020 FINAL  Final  Urine culture     Status: None   Collection Time: 11/09/20  2:41 PM   Specimen: Urine, Catheterized  Result Value Ref Range Status   Specimen  Description   Final    URINE, CATHETERIZED Performed at [facility], [address], [place], [place] [address]    Special Requests   Final    NONE Performed at  [facility], [address], [place], [place] [address]    Culture   Final    NO GROWTH Performed at [facility], [place] [address] [place], [place] [address]    Report Status 11/11/2020 FINAL  Final  Resp Panel by RT-PCR (Flu A&B, Covid) Nasopharyngeal Swab     Status: None   Collection Time: 11/09/20  6:49 PM   Specimen: Nasopharyngeal Swab; Nasopharyngeal(NP) swabs in vial transport medium  Result Value Ref Range Status   SARS Coronavirus 2 by RT PCR NEGATIVE NEGATIVE Final    Comment: (NOTE) SARS-CoV-2 target nucleic acids are NOT DETECTED.  The SARS-CoV-2 RNA is generally detectable in upper respiratory specimens during the acute phase of infection. The lowest concentration of SARS-CoV-2 viral copies this assay can detect is 138 copies/mL. A negative result does not preclude SARS-Cov-2 infection and should not be used as the sole basis for treatment or other patient management decisions. A negative result may occur with  improper specimen collection/handling, submission of specimen other than nasopharyngeal swab, presence of viral mutation(s) within the areas targeted by this assay, and inadequate number of viral copies(<138 copies/mL). A negative result must be combined with clinical observations, patient history, and epidemiological information. The expected result is Negative.  Fact Sheet for Patients:  EntrepreneurPulse.com.au  Fact Sheet for Healthcare Providers:  IncredibleEmployment.be  This test is no t yet approved or cleared by the [place] FDA and  has been authorized for detection and/or diagnosis of SARS-CoV-2 by FDA under an Emergency Use Authorization (EUA). This EUA will remain  in effect (meaning this test can be used) for the duration of the COVID-19 declaration under Section 564(b)(1) of the Act, 21 U.S.C.section 360bbb-3(b)(1), unless the authorization is terminated  or revoked sooner.       Influenza A  by PCR NEGATIVE NEGATIVE Final   Influenza B by PCR NEGATIVE NEGATIVE Final    Comment: (NOTE) The Xpert Xpress SARS-CoV-2/FLU/RSV plus assay is intended as an aid in the diagnosis of influenza from Nasopharyngeal swab specimens and should not be used as a sole basis for treatment. Nasal washings and aspirates are unacceptable for Xpert Xpress SARS-CoV-2/FLU/RSV testing.  Fact Sheet for Patients: EntrepreneurPulse.com.au  Fact Sheet for Healthcare Providers: IncredibleEmployment.be  This test is not yet approved or cleared by the [place] FDA and has been authorized for detection and/or diagnosis of SARS-CoV-2 by FDA under an Emergency Use Authorization (EUA). This EUA will remain in effect (meaning this test can be used) for the duration of the COVID-19 declaration under Section 564(b)(1) of the Act, 21 U.S.C. section 360bbb-3(b)(1), unless the authorization is terminated or revoked.  Performed at [facility], [address], [place], [place] [address]      Signed: Terrilee Croak  [facility] 11/17/2020, 11:02 AM

## 2020-11-17 NOTE — Progress Notes (Signed)
Mobility Specialist: Progress Note   11/17/20 1348  Mobility  Activity Ambulated in hall  Level of Assistance Modified independent, requires aide device or extra time  Assistive Device Front wheel walker  Distance Ambulated (ft) 110 ft  Mobility Response Tolerated well  Mobility performed by Mobility specialist  $Mobility charge 1 Mobility   Pre-Mobility: 69 HR, 97%  SpO2 Post-Mobility: 62 HR, 97% SpO2  Pt was asx during ambulation and is eager to discharge home.   [facility] Melissa Lopez Mobility Specialist

## 2020-11-17 NOTE — Progress Notes (Signed)
Patient given discharge instructions and stated understanding. 

## 2020-11-17 NOTE — TOC Transition Note (Signed)
Transition of Care (TOC) - CM/SW Discharge Note Marvetta Gibbons RN, BSN Transitions of Care Unit 4E- RN Case Manager See Treatment Team for direct phone #    Patient Details  Name: Melissa Lopez MRN: [id number] Date of Birth: 1951/11/04  Transition of Care [facility]) CM/SW Contact:  Dawayne Patricia, RN Phone Number: 11/17/2020, 12:33 PM   Clinical Narrative:    Pt stable for transition home today, orders to resume [facility] services have been placed- notified [place] with [facility] for resumption of HHRN/PT/aide- they will contact pt post discharge for restart of [place] needs.    Final next level of care: [place] Barriers to Discharge: No Barriers Identified   Patient Goals and CMS Choice Patient states their goals for this hospitalization and ongoing recovery are:: return home      Discharge Placement                Home  With [facility]        Discharge Plan and Services   Discharge Planning Services: CM Consult Post Acute Care Choice: Home Health,Resumption of Svcs/PTA Provider            DME Agency: NA       HH Arranged: RN,PT,Nurse's Aide [place]: [place] ([facility]) Date HH Agency Contacted: 11/17/20 Time [place]: 1232 Representative spoke with at [place]: [place] ([place]) Interventions     Readmission Risk Interventions Readmission Risk Prevention Plan 11/17/2020  Transportation Screening Complete  PCP or Specialist Appt within 3-5 Days Complete  HRI or [place] Complete  Social Work Consult for [place] Planning/Counseling Complete  Palliative Care Screening Not Applicable  Medication Review Press photographer) Complete

## 2020-11-17 NOTE — Progress Notes (Incomplete)
Initial Nutrition Assessment  DOCUMENTATION CODES:      INTERVENTION:  ***   NUTRITION DIAGNOSIS:     related to   as evidenced by  .  ***  GOAL:      ***  MONITOR:      REASON FOR ASSESSMENT:        ASSESSMENT:    Pt is a 69 y.o. 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 On 12/2, pt presented to [facility] with c/o worsening N/V/D associated with decreased po intake x 2 weeks.  Per chart review, last vomiting was 36 hours ago and pt is feeling better.   Labs reviewed: CBG's x 24 hours: 125-227, potassium 3.2  Medications reviewed and include: SSI, lantus, miralax, protonix, D5 @ 50 mL/hr.    NUTRITION - FOCUSED PHYSICAL EXAM:  {RD Focused Exam List:21252}  Diet Order:   Diet Order            Diet Carb Modified Fluid consistency: Thin; Room service appropriate? Yes  Diet effective now                 EDUCATION NEEDS:      Skin:  Skin Assessment: Reviewed RN Assessment  Last BM:  11/09/20  Height:   Ht Readings from Last 1 Encounters:  11/09/20 5\' 4"  (1.626 m)    Weight:   Wt Readings from Last 1 Encounters:  11/09/20 68.7 kg    Ideal Body Weight:  54.5 kg  BMI:  Body mass index is 26 kg/m.  Estimated Nutritional Needs:   Kcal:     Protein:     Fluid:       Ronnald Nian, Dietetic Intern Pager: [phone] If unavailable: [phone]

## 2020-11-17 NOTE — Plan of Care (Signed)
Problem: Education: Goal: Knowledge of General Education information will improve Description: Including pain rating scale, medication(s)/side effects and non-pharmacologic comfort measures 11/17/2020 1218 by Lurline Idol, RN Outcome: Adequate for Discharge 11/17/2020 1218 by Lurline Idol, RN Outcome: Adequate for Discharge   Problem: Health Behavior/Discharge Planning: Goal: Ability to manage health-related needs will improve 11/17/2020 1218 by Lurline Idol, RN Outcome: Adequate for Discharge 11/17/2020 1218 by Lurline Idol, RN Outcome: Adequate for Discharge   Problem: Clinical Measurements: Goal: Ability to maintain clinical measurements within normal limits will improve 11/17/2020 1218 by Lurline Idol, RN Outcome: Adequate for Discharge 11/17/2020 1218 by Lurline Idol, RN Outcome: Adequate for Discharge Goal: Will remain free from infection 11/17/2020 1218 by Lurline Idol, RN Outcome: Adequate for Discharge 11/17/2020 1218 by Lurline Idol, RN Outcome: Adequate for Discharge Goal: Respiratory complications will improve 11/17/2020 1218 by Lurline Idol, RN Outcome: Adequate for Discharge 11/17/2020 1218 by Lurline Idol, RN Outcome: Adequate for Discharge Goal: Cardiovascular complication will be avoided 11/17/2020 1218 by Lurline Idol, RN Outcome: Adequate for Discharge 11/17/2020 1218 by Lurline Idol, RN Outcome: Adequate for Discharge   Problem: Education: Goal: Ability to describe self-care measures that may prevent or decrease complications (Diabetes Survival Skills Education) will improve 11/17/2020 1218 by Lurline Idol, RN Outcome: Adequate for Discharge 11/17/2020 1218 by Lurline Idol, RN Outcome: Adequate for Discharge Goal: Individualized Educational Video(s) 11/17/2020 1218 by Lurline Idol, RN Outcome: Adequate for Discharge 11/17/2020 1218 by Lurline Idol, RN Outcome: Adequate for Discharge   Problem: Fluid Volume: Goal:  Ability to maintain a balanced intake and output will improve 11/17/2020 1218 by Lurline Idol, RN Outcome: Adequate for Discharge 11/17/2020 1218 by Lurline Idol, RN Outcome: Adequate for Discharge   Problem: Health Behavior/Discharge Planning: Goal: Ability to identify and utilize available resources and services will improve 11/17/2020 1218 by Lurline Idol, RN Outcome: Adequate for Discharge 11/17/2020 1218 by Lurline Idol, RN Outcome: Adequate for Discharge Goal: Ability to manage health-related needs will improve 11/17/2020 1218 by Lurline Idol, RN Outcome: Adequate for Discharge 11/17/2020 1218 by Lurline Idol, RN Outcome: Adequate for Discharge   Problem: Metabolic: Goal: Ability to maintain appropriate glucose levels will improve 11/17/2020 1218 by Lurline Idol, RN Outcome: Adequate for Discharge 11/17/2020 1218 by Lurline Idol, RN Outcome: Adequate for Discharge   Problem: Nutritional: Goal: Maintenance of adequate nutrition will improve 11/17/2020 1218 by Lurline Idol, RN Outcome: Adequate for Discharge 11/17/2020 1218 by Lurline Idol, RN Outcome: Adequate for Discharge Goal: Progress toward achieving an optimal weight will improve 11/17/2020 1218 by Lurline Idol, RN Outcome: Adequate for Discharge 11/17/2020 1218 by Lurline Idol, RN Outcome: Adequate for Discharge   Problem: Skin Integrity: Goal: Risk for impaired skin integrity will decrease 11/17/2020 1218 by Lurline Idol, RN Outcome: Adequate for Discharge 11/17/2020 1218 by Lurline Idol, RN Outcome: Adequate for Discharge   Problem: Tissue Perfusion: Goal: Adequacy of tissue perfusion will improve 11/17/2020 1218 by Lurline Idol, RN Outcome: Adequate for Discharge 11/17/2020 1218 by Lurline Idol, RN Outcome: Adequate for Discharge   Problem: Education: Goal: Knowledge of General Education information will improve Description: Including pain rating scale,  medication(s)/side effects and non-pharmacologic comfort measures 11/17/2020 1218 by Lurline Idol, RN Outcome: Adequate for Discharge 11/17/2020 1218 by Lurline Idol, RN Outcome: Adequate for Discharge   Problem: Health Behavior/Discharge Planning: Goal:  Ability to manage health-related needs will improve 11/17/2020 1218 by Lurline Idol, RN Outcome: Adequate for Discharge 11/17/2020 1218 by Lurline Idol, RN Outcome: Adequate for Discharge   Problem: Clinical Measurements: Goal: Ability to maintain clinical measurements within normal limits will improve 11/17/2020 1218 by Lurline Idol, RN Outcome: Adequate for Discharge 11/17/2020 1218 by Lurline Idol, RN Outcome: Adequate for Discharge Goal: Will remain free from infection 11/17/2020 1218 by Lurline Idol, RN Outcome: Adequate for Discharge 11/17/2020 1218 by Lurline Idol, RN Outcome: Adequate for Discharge Goal: Diagnostic test results will improve 11/17/2020 1218 by Lurline Idol, RN Outcome: Adequate for Discharge 11/17/2020 1218 by Lurline Idol, RN Outcome: Adequate for Discharge Goal: Respiratory complications will improve 11/17/2020 1218 by Lurline Idol, RN Outcome: Adequate for Discharge 11/17/2020 1218 by Lurline Idol, RN Outcome: Adequate for Discharge Goal: Cardiovascular complication will be avoided 11/17/2020 1218 by Lurline Idol, RN Outcome: Adequate for Discharge 11/17/2020 1218 by Lurline Idol, RN Outcome: Adequate for Discharge   Problem: Activity: Goal: Risk for activity intolerance will decrease 11/17/2020 1218 by Lurline Idol, RN Outcome: Adequate for Discharge 11/17/2020 1218 by Lurline Idol, RN Outcome: Adequate for Discharge   Problem: Nutrition: Goal: Adequate nutrition will be maintained 11/17/2020 1218 by Lurline Idol, RN Outcome: Adequate for Discharge 11/17/2020 1218 by Lurline Idol, RN Outcome: Adequate for Discharge   Problem: Coping: Goal: Level of  anxiety will decrease 11/17/2020 1218 by Lurline Idol, RN Outcome: Adequate for Discharge 11/17/2020 1218 by Lurline Idol, RN Outcome: Adequate for Discharge   Problem: Elimination: Goal: Will not experience complications related to bowel motility 11/17/2020 1218 by Lurline Idol, RN Outcome: Adequate for Discharge 11/17/2020 1218 by Lurline Idol, RN Outcome: Adequate for Discharge Goal: Will not experience complications related to urinary retention 11/17/2020 1218 by Lurline Idol, RN Outcome: Adequate for Discharge 11/17/2020 1218 by Lurline Idol, RN Outcome: Adequate for Discharge   Problem: Pain Managment: Goal: General experience of comfort will improve 11/17/2020 1218 by Lurline Idol, RN Outcome: Adequate for Discharge 11/17/2020 1218 by Lurline Idol, RN Outcome: Adequate for Discharge   Problem: Safety: Goal: Ability to remain free from injury will improve 11/17/2020 1218 by Lurline Idol, RN Outcome: Adequate for Discharge 11/17/2020 1218 by Lurline Idol, RN Outcome: Adequate for Discharge   Problem: Skin Integrity: Goal: Risk for impaired skin integrity will decrease 11/17/2020 1218 by Lurline Idol, RN Outcome: Adequate for Discharge 11/17/2020 1218 by Lurline Idol, RN Outcome: Adequate for Discharge

## 2020-11-17 NOTE — Plan of Care (Signed)
Plan of care reviewed.  Problem: Nutrition: Poor appetite due to persistent nausea. She threw up one time with clear yellowish mucous emesis.  Goal: Adequate nutrition will be maintained Outcome: Not progressing: Continue to have nausea. Reglan and Zofran given PRN.    Problem: Clinical Measurements: Room air, no resrpiatory distress noted. Goal: Respiratory complications will improve Outcome: Progressing   Problem: Clinical Measurements: has permanent pacemaker. EKG shows Atrial and Ventricular paced. HR 60s. BP stable Goal: Cardiovascular complication will be avoided Outcome: Progressing   Problem: Activity: High fall risk. Lethargy and unsteady gait. Requires one standby assistance. Goal: Risk for activity intolerance will decrease Outcome: Progressing   Problem: Clinical Measurements: Remains afebrile. On Keflex and Erythromycin PO. Goal: Will remain free from infection Outcome: Progressing  Problem: Elimination: Constipation. Her last bowel movement was on 11/09/2020 which is 8 days ago. Miralax given.  Goal: Will not experience complications related to bowel motility Outcome: Not Progressing. Pt sated she has passed minimal of gas but no BM. We will discuss with team at am for plan of care.   Kennyth Lose, RN

## 2020-11-21 ENCOUNTER — Ambulatory Visit: Payer: Medicare Other | Admitting: "Endocrinology

## 2020-11-21 DIAGNOSIS — E1142 Type 2 diabetes mellitus with diabetic polyneuropathy: Secondary | ICD-10-CM | POA: Diagnosis not present

## 2020-11-21 DIAGNOSIS — I130 Hypertensive heart and chronic kidney disease with heart failure and stage 1 through stage 4 chronic kidney disease, or unspecified chronic kidney disease: Secondary | ICD-10-CM | POA: Diagnosis not present

## 2020-11-21 DIAGNOSIS — I5032 Chronic diastolic (congestive) heart failure: Secondary | ICD-10-CM | POA: Diagnosis not present

## 2020-11-21 DIAGNOSIS — I2510 Atherosclerotic heart disease of native coronary artery without angina pectoris: Secondary | ICD-10-CM | POA: Diagnosis not present

## 2020-11-21 DIAGNOSIS — N1832 Chronic kidney disease, stage 3b: Secondary | ICD-10-CM | POA: Diagnosis not present

## 2020-11-21 DIAGNOSIS — E1122 Type 2 diabetes mellitus with diabetic chronic kidney disease: Secondary | ICD-10-CM | POA: Diagnosis not present

## 2020-11-22 DIAGNOSIS — I130 Hypertensive heart and chronic kidney disease with heart failure and stage 1 through stage 4 chronic kidney disease, or unspecified chronic kidney disease: Secondary | ICD-10-CM | POA: Diagnosis not present

## 2020-11-22 DIAGNOSIS — I5032 Chronic diastolic (congestive) heart failure: Secondary | ICD-10-CM | POA: Diagnosis not present

## 2020-11-22 DIAGNOSIS — N1832 Chronic kidney disease, stage 3b: Secondary | ICD-10-CM | POA: Diagnosis not present

## 2020-11-22 DIAGNOSIS — I2510 Atherosclerotic heart disease of native coronary artery without angina pectoris: Secondary | ICD-10-CM | POA: Diagnosis not present

## 2020-11-22 DIAGNOSIS — E1122 Type 2 diabetes mellitus with diabetic chronic kidney disease: Secondary | ICD-10-CM | POA: Diagnosis not present

## 2020-11-22 DIAGNOSIS — E1142 Type 2 diabetes mellitus with diabetic polyneuropathy: Secondary | ICD-10-CM | POA: Diagnosis not present

## 2020-11-23 ENCOUNTER — Other Ambulatory Visit: Payer: Self-pay | Admitting: *Deleted

## 2020-11-23 NOTE — Patient Outreach (Signed)
[facility]) Care Management  11/23/2020  Melissa Lopez September 18, 1951 [id number]  Red EMMI Flag   Day: 4 Date:11/22/20 Red Alert Reason : Who reached   Patient  Questions about discharge papers?   Yes  Unfilled prescriptions?   Yes  Lost interest in things?   Yes  Sad/hopeless/anxious/empty?   Yes    Patient with admission at [facility] 12/2-12/16/21 Dx: Intractable nausea vomiting, Acute chest pain,Cardiac Cath nonobstructive CAD,  Outreach Attempt #1 Subjective Unsuccessful call attempt to patient , no answer able to leave a HIPAA compliant voicemail message for return call.   Plan Will send unsuccessful outreach letter, and plan return call in the next 4 business days.    Joylene Draft, RN, BSN  [place] Management Coordinator  [phone]- Mobile [phone]- Toll Free Main Office

## 2020-11-27 NOTE — Progress Notes (Signed)
This is a telemedicine visit Patient location: Home Provider location: Office  Cardiology Office Note  Date: 11/28/2020   ID: Melissa Lopez, DOB [birth year], MRN [id number]  PCP:  Celene Squibb, MD  Cardiologist:  Carlyle Dolly, MD Electrophysiologist:  Thompson Grayer, MD   Chief Complaint: Follow-up CAD, SLE, HTN, CKD 3  History of Present Illness: Melissa Lopez is a 69 y.o. female with a history of CAD, cardiac pacemaker,  SOB, HTN, CKD stage III, DM 2.  Last encounter on 10/10/2020 with Estella Husk, PA-C.  She had recently been discharged from hospital on 09/29/2020 with intractable nausea, vomiting, abdominal pain of unclear etiology.  She also had hypercalcemia, hypokalemia, hypophosphatemia.  Did have primary hyperparathyroidism.  She was continue with chronic pain in all of her joints.  Blood pressure has been running high but recently systolic ranging in the 102-725 range.  She had fallen twice since discharge to home due to dizziness.  Stated the dizziness usually happens about 2 hours after taking medications.  Coreg had been decreased to 6.25 mg p.o. twice daily and was taking hydralazine 50 mg twice daily but discharge notes stated hydralazine to be taken 25 mg once daily only.  A follow-up BMP was ordered.  Recent admission after presenting to the ED at [facility] with complaints of 2-week history of worsening nausea, vomiting and diarrhea associated with decreased oral intake.  She had been admitted October with similar symptoms with no clear etiology of intractable nausea and vomiting.  She had multiple negative CT scans.  EGD showed bile reflux suggestive of gastric dysmotility versus reflux gastritis.  Her symptoms resolved and she was discharged.  In emergency department patient's blood pressure was elevated to 140s.  CT scan of the abdomen did not show any acute findings.  Initial troponin was negative.  EKG however was concerning for ST elevation in multiple leads.   She was transported emergently to the Cath Lab and underwent cardiac catheterization.  Cardiac catheterization was unremarkable and hence patient was transferred to hospital service as primary team.  She was started on Reglan and Zofran.  GI was consulted.  She was started on erythromycin which relieved her symptoms she was eventually discharged.  Spoke to the patient and family member.  Family member states she has been doing well and she has been trying to hydrate her with multiple fluids.  Blood pressure today is stable at 118/85 and heart rate of 66.  Family member states she denies any shortness of breath or chest pain.  Nausea, vomiting, and diarrhea have subsided.  Denies any issues since cardiac catheterization.   Past Medical History:  Diagnosis Date  . CKD (chronic kidney disease) stage 3, GFR 30-59 ml/min (HCC) 10/31/2018   - per patient report  . Coronary artery disease    per patient report - 2 caths with non-obstructive CAD  . Essential hypertension   . H/O cardiac pacemaker 10/31/2018  . Sick sinus syndrome ([place])   . Type 2 diabetes mellitus with complication, with long-term current use of insulin ([place]) 10/31/2018   with diabetic nephropathy    Past Surgical History:  Procedure Laterality Date  . BIOPSY  09/28/2020   Procedure: BIOPSY;  Surgeon: Ronald Lobo, MD;  Location: [facility];  Service: Endoscopy;;  . CARDIAC PACEMAKER PLACEMENT  2008   in [place] for sick sinus syndrome  . CORONARY/GRAFT ACUTE MI REVASCULARIZATION N/A 11/09/2020   Procedure: CORONARY/GRAFT ACUTE MI REVASCULARIZATION;  Surgeon: Leonie Man, MD;  Location:  [facility] INVASIVE CV LAB;  Service: Cardiovascular;  Laterality: N/A;  . ESOPHAGOGASTRODUODENOSCOPY N/A 09/28/2020   Procedure: ESOPHAGOGASTRODUODENOSCOPY (EGD);  Surgeon: Ronald Lobo, MD;  Location: [facility];  Service: Endoscopy;  Laterality: N/A;  . ESOPHAGOGASTRODUODENOSCOPY (EGD) WITH PROPOFOL N/A 11/10/2018   Procedure:  ESOPHAGOGASTRODUODENOSCOPY (EGD) WITH PROPOFOL;  Surgeon: Wilford Corner, MD;  Location: [facility];  Service: Endoscopy;  Laterality: N/A;  . LEFT HEART CATH AND CORONARY ANGIOGRAPHY N/A 10/31/2018   Procedure: LEFT HEART CATH AND CORONARY ANGIOGRAPHY;  Surgeon: Leonie Man, MD;  Location: [place] CV LAB;  Service: Cardiovascular;  Laterality: N/A;  . PACEMAKER GENERATOR CHANGE  09/2018   in [place]    Current Outpatient Medications  Medication Sig Dispense Refill  . acetaminophen (TYLENOL) 650 MG CR tablet Take 1,300 mg by mouth every 8 (eight) hours as needed for pain.    Marland Kitchen amLODipine (NORVASC) 10 MG tablet TAKE 1 TABLET BY MOUTH EVERY DAY 90 tablet 3  . aspirin 81 MG chewable tablet Chew 1 tablet (81 mg total) by mouth daily. 30 tablet 0  . buPROPion (WELLBUTRIN SR) 150 MG 12 hr tablet Take 150 mg by mouth daily.    . carvedilol (COREG) 12.5 MG tablet Take 1 tablet (12.5 mg total) by mouth 2 (two) times daily with a meal. 60 tablet 2  . escitalopram (LEXAPRO) 20 MG tablet Take 20 mg by mouth daily.    Marland Kitchen gabapentin (NEURONTIN) 100 MG capsule Take 100 mg by mouth 3 (three) times daily.    . hydrALAZINE (APRESOLINE) 25 MG tablet Take 25 mg by mouth at bedtime.    . insulin degludec (TRESIBA FLEXTOUCH) 100 UNIT/ML FlexTouch Pen Inject 5 Units into the skin daily.    Marland Kitchen lisinopril (ZESTRIL) 20 MG tablet Take 1 tablet (20 mg total) by mouth daily. 90 tablet 3  . LORazepam (ATIVAN) 1 MG tablet Take 1 mg by mouth daily as needed for anxiety.    . metoCLOPramide (REGLAN) 5 MG tablet Take 1 tablet (5 mg total) by mouth 3 (three) times daily for 14 days. 42 tablet 0  . Multiple Vitamin (MULTIVITAMIN WITH MINERALS) TABS tablet Take 1 tablet by mouth daily.    Marland Kitchen omeprazole (PRILOSEC) 20 MG capsule Take 20 mg by mouth daily.    Marland Kitchen oxybutynin (DITROPAN-XL) 10 MG 24 hr tablet Take 10 mg by mouth daily.     . polyethylene glycol (MIRALAX / GLYCOLAX) 17 g packet Take 17 g by mouth 2  (two) times daily. 14 each 0  . rosuvastatin (CRESTOR) 5 MG tablet Take one tablet daily 90 tablet 3  . traZODone (DESYREL) 100 MG tablet Take 100 mg by mouth at bedtime.    Marland Kitchen erythromycin ethylsuccinate (EES) 200 MG/5ML suspension Take 5 mLs (200 mg total) by mouth 3 (three) times daily for 14 days. (Patient not taking: Reported on 11/28/2020) 210 mL 0  . Nutritional Supplements (,FEEDING SUPPLEMENT, PROSOURCE PLUS) liquid Take 30 mLs by mouth daily. 887 mL 0  . ondansetron (ZOFRAN) 4 MG tablet Take 1 tablet (4 mg total) by mouth every 6 (six) hours. (Patient not taking: Reported on 11/28/2020) 12 tablet 0   No current facility-administered medications for this visit.   Allergies:  Contrast media [iodinated diagnostic agents], Oxycodone-acetaminophen, Xarelto  [rivaroxaban], and Oxycodone   Social History: The patient  reports that she quit smoking about 51 years ago. She quit after 3.00 years of use. She has never used smokeless tobacco. She reports previous alcohol use. She  reports that she does not use drugs.   Family History: The patient's family history includes Bone cancer in her mother; Breast cancer in her mother and sister; Diabetes in her brother; Heart attack (age of onset: 62) in her father; Heart disease in her brother; Other in her sister.   ROS:  Please see the history of present illness. Otherwise, complete review of systems is positive for none.  All other systems are reviewed and negative.   Physical Exam: VS:  BP 118/85   Pulse 66   Ht 5\' 4"  (1.626 m)   Wt 145 lb (65.8 kg)   BMI 24.89 kg/m , BMI Body mass index is 24.89 kg/m.  Wt Readings from Last 3 Encounters:  11/28/20 145 lb (65.8 kg)  11/09/20 151 lb 7.3 oz (68.7 kg)  10/28/20 163 lb 2.3 oz (74 kg)   Patient had a normal speech pattern.  No evidence of cough, wheezing, dyspnea.  ECG: No EKG today due to telemedicine visit  Recent Labwork: 09/22/2020: TSH 2.207 11/10/2020: ALT 13; AST 14 11/15/2020:  Hemoglobin 13.6; Platelets 198 11/17/2020: BUN 10; Creatinine, Ser 1.54; Magnesium 2.1; Potassium 3.2; Sodium 135     Component Value Date/Time   CHOL 153 11/01/2018 0516   TRIG 175 (H) 11/01/2018 0516   HDL 24 (L) 11/01/2018 0516   CHOLHDL 6.4 11/01/2018 0516   VLDL 35 11/01/2018 0516   LDLCALC 94 11/01/2018 0516    Other Studies Reviewed Today:  November 09, 2020 CORONARY/GRAFT ACUTE MI REVASCULARIZATION      1st Mrg lesion is 100% stenosed.  Ost Ramus lesion is 65% stenosed.  Ost LAD to Prox LAD lesion is 15% stenosed with 60% stenosed side branch in Ost 1st Diag.  Ost Cx to Prox Cx lesion is 45% stenosed.  Diffuse mild CAD and very tortuous vessels. No true occlusive lesions. None   SUMMARY  Stable nonocclusive mild to moderate disease with no culprit lesion for ST elevation MI. ->  The true diagnosis is no longer STEMI -> very similar findings with that very similar presentation 2 years ago.  EF not assessed due to plan to conserve contrast.  Relatively low LVEDP-IV given 500 mL bolus.   PLAN  Due to bed availability, she will be admitted to CT ICU, but could be transferred if necessary  We will consult [facility] medicine service to assist with her severely elevated white blood cell count, already on antibiotics for possible UTI as well as profound nausea vomiting and hyperglycemia. ->  Concern for possible HONK (hyperosmotic nonketotic ketoacidosis  Diagnostic Dominance: Right         2D echo 09/14/2020 IMPRESSIONS  1. The endocardium in the apex is poorly visualized, cannot exclude  possible apical hypokinesis and/or apical hypertrophy, consider contrast  study. . Left ventricular ejection fraction, by estimation, is 55 to 60%.  The left ventricle has normal  function. Left ventricular endocardial border not optimally defined to  evaluate regional wall motion. There is moderate left ventricular  hypertrophy. Left ventricular diastolic parameters are  consistent with  Grade I diastolic dysfunction (impaired  relaxation).  2. Right ventricular systolic function is normal. The right ventricular  size is normal.  3. The mitral valve is normal in structure. No evidence of mitral valve  regurgitation. No evidence of mitral stenosis.  4. The aortic valve is tricuspid. Aortic valve regurgitation is not  visualized. No aortic stenosis is present.  5. The inferior vena cava is normal in size with greater than 50%  respiratory variability, suggesting right atrial pressure of 3 mmHg.   Comparison(s): Echocardiogram done 11/01/18 showed and EF of 55-60%.   Assessment and Plan:  1. CAD in native artery   2. Cardiac pacemaker in situ   3. SOB (shortness of breath)   4. Essential hypertension   5. Stage 3b chronic kidney disease ([place])    1. CAD in native artery No anginal or exertional symptoms.  Recent cardiac catheterization on November 09, 2020 showed first marginal lesion 100%, ostial ramus 65%, ostial LAD to proximal LAD 15%, with 60% stenosis side branch and ostial first diagonal.  Ostial circumflex to proximal circumflex, 45% stenosed.  Diffuse mild CAD and very tortuous vessels.  No true occlusive lesions.  Continue aspirin 81 mg daily.  Continue carvedilol 6.25 mg p.o. twice daily.  Continue Crestor 5 mg p.o. daily.   2. Cardiac pacemaker in situ Recent remote pacemaker check by Dr. Rayann Heman on 10/22/2020 showed normal device function, battery status, lead stable, histograms reviewed and appropriate.  3. SOB (shortness of breath) No recent shortness of breath or dyspnea on exertion.  4. Essential hypertension Blood pressure well controlled on current therapy.  Continue amlodipine 10 mg daily, lisinopril 20 mg daily, hydralazine 25 mg p.o. at bedtime  5. Stage 3b chronic kidney disease ([place]) Creatinine on 11/17/2020 1.54.  GFR 36.  Medication Adjustments/Labs and Tests Ordered: Current medicines are reviewed at length with the  patient today.  Concerns regarding medicines are outlined above.   Spoke to patient via telemedicine visit 10 minutes   Disposition: Follow-up with Dr. Harl Bowie or APP 3 months  Signed, Levell July, NP 11/28/2020 11:13 AM    [facility] [place], [place], [place] [address] Phone: [phone]; Fax: [phone]

## 2020-11-28 ENCOUNTER — Encounter: Payer: Self-pay | Admitting: Family Medicine

## 2020-11-28 ENCOUNTER — Telehealth (INDEPENDENT_AMBULATORY_CARE_PROVIDER_SITE_OTHER): Payer: Medicare Other | Admitting: Family Medicine

## 2020-11-28 ENCOUNTER — Other Ambulatory Visit: Payer: Self-pay | Admitting: *Deleted

## 2020-11-28 ENCOUNTER — Telehealth: Payer: Self-pay | Admitting: Family Medicine

## 2020-11-28 VITALS — BP 118/85 | HR 66 | Ht 64.0 in | Wt 145.0 lb

## 2020-11-28 DIAGNOSIS — I10 Essential (primary) hypertension: Secondary | ICD-10-CM

## 2020-11-28 DIAGNOSIS — I2510 Atherosclerotic heart disease of native coronary artery without angina pectoris: Secondary | ICD-10-CM

## 2020-11-28 DIAGNOSIS — E1142 Type 2 diabetes mellitus with diabetic polyneuropathy: Secondary | ICD-10-CM | POA: Diagnosis not present

## 2020-11-28 DIAGNOSIS — R0602 Shortness of breath: Secondary | ICD-10-CM

## 2020-11-28 DIAGNOSIS — I130 Hypertensive heart and chronic kidney disease with heart failure and stage 1 through stage 4 chronic kidney disease, or unspecified chronic kidney disease: Secondary | ICD-10-CM | POA: Diagnosis not present

## 2020-11-28 DIAGNOSIS — E1122 Type 2 diabetes mellitus with diabetic chronic kidney disease: Secondary | ICD-10-CM | POA: Diagnosis not present

## 2020-11-28 DIAGNOSIS — Z950 Presence of cardiac pacemaker: Secondary | ICD-10-CM

## 2020-11-28 DIAGNOSIS — N1832 Chronic kidney disease, stage 3b: Secondary | ICD-10-CM | POA: Diagnosis not present

## 2020-11-28 DIAGNOSIS — I5032 Chronic diastolic (congestive) heart failure: Secondary | ICD-10-CM | POA: Diagnosis not present

## 2020-11-28 NOTE — Telephone Encounter (Signed)
  Patient Consent for Virtual Visit         Melissa Lopez has provided verbal consent on 11/28/2020 for a virtual visit (video or telephone).   CONSENT FOR VIRTUAL VISIT FOR:  Melissa Lopez  By participating in this virtual visit I agree to the following:  I hereby voluntarily request, consent and authorize [facility] and its employed or contracted physicians, physician assistants, nurse practitioners or other licensed health care professionals (the Practitioner), to provide me with telemedicine health care services (the "Services") as deemed necessary by the treating Practitioner. I acknowledge and consent to receive the Services by the Practitioner via telemedicine. I understand that the telemedicine visit will involve communicating with the Practitioner through live audiovisual communication technology and the disclosure of certain medical information by electronic transmission. I acknowledge that I have been given the opportunity to request an in-person assessment or other available alternative prior to the telemedicine visit and am voluntarily participating in the telemedicine visit.  I understand that I have the right to withhold or withdraw my consent to the use of telemedicine in the course of my care at any time, without affecting my right to future care or treatment, and that the Practitioner or I may terminate the telemedicine visit at any time. I understand that I have the right to inspect all information obtained and/or recorded in the course of the telemedicine visit and may receive copies of available information for a reasonable fee.  I understand that some of the potential risks of receiving the Services via telemedicine include:  Marland Kitchen Delay or interruption in medical evaluation due to technological equipment failure or disruption; . Information transmitted may not be sufficient (e.g. poor resolution of images) to allow for appropriate medical decision making by the  Practitioner; and/or  . In rare instances, security protocols could fail, causing a breach of personal health information.  Furthermore, I acknowledge that it is my responsibility to provide information about my medical history, conditions and care that is complete and accurate to the best of my ability. I acknowledge that Practitioner's advice, recommendations, and/or decision may be based on factors not within their control, such as incomplete or inaccurate data provided by me or distortions of diagnostic images or specimens that may result from electronic transmissions. I understand that the practice of medicine is not an exact science and that Practitioner makes no warranties or guarantees regarding treatment outcomes. I acknowledge that a copy of this consent can be made available to me via my patient portal ([facility]), or I can request a printed copy by calling the office of [facility].    I understand that my insurance will be billed for this visit.   I have read or had this consent read to me. . I understand the contents of this consent, which adequately explains the benefits and risks of the Services being provided via telemedicine.  . I have been provided ample opportunity to ask questions regarding this consent and the Services and have had my questions answered to my satisfaction. . I give my informed consent for the services to be provided through the use of telemedicine in my medical care

## 2020-11-28 NOTE — Patient Outreach (Signed)
[facility]) Care Management  11/28/2020  Melissa Lopez [birth year] [id number]   Red EMMI Flag   Day: 4 Date:11/22/20 Red Alert Reason  Who reached  Patient  Questions about discharge papers?  Yes  Unfilled prescriptions?  Yes  Lost interest in things?  Yes  Sad/hopeless/anxious/empty?  Yes   Patient with admission at [facility] 12/2-12/16/21 Dx: Intractable nausea vomiting, Acute chest pain,Cardiac Cath nonobstructive CAD,   Outreach attempt #2 Subjective: Outreach call to patient, person answering phone identified as Melissa Lopez , daughter on designated party release , HIPAA verified . Melissa Lopez request return call as [facility] currently visiting.   Returned call to patient , spoke with daughter Melissa Lopez, explained reason for the call and EMMI automated discharge calls.  Addressed EMMI red reasons : Unfilled prescriptions,Question on discharge papers? Yes  daughter discussed not having prescription for antibiotic ordered at discharge she states he was not sent to pharmacy, she report having Coreg , and reglan prescribed at discharge.  Daughter states that she is unsure what is going on but medication will cost $1400 and they can't afford that. She is agreeable to assistance with following up with this. She states that she hasn't followed up with Melissa Lopez only [place].  She reports they have a lot going on now and unable to talk further is agreeable to return call. She states patient is doing okay she had visit with cardiology on today.   Care Coordination  Placed call to [place], representative states that insurance is not covering  Erythromycin liquid prescribed on 12/10 he reports prior authorization may be needed and has been sent , and will send again today.  Placed secure chat to provider Dr. Terrilee Croak regarding patient has not being able to start Erythromycin as prescribed due to cost , communicated  [place] faxing prior authorization to prescribing provider at discharge.  Communication from Dr. Pietro Cassis requested contacting GI , sent return message per GI note 12/9  no outpatient GI follow up recommended.  Placed call to Melissa Lopez office to discuss concern regarding patient not being able to receive Erythromycin prescribed at discharge on 11/17/20  ( to start 12/10- end date 12/24)  due to cost of $1400, only able to leave message as office closed early today.   Further EMMI red responses:  Lost interest in things ? Yes, feeling sad hopeless, lost, per daughter patient resting at this time unable to address with patient.    Social  Patient lives at home with her daughter Melissa Lopez that is able to assist as needed.  [facility] is [facility] with [facility] as prior to admission and had visit on today.  Patient uses a walker at home, patient uses [employer] transportation and daughter able to go with her.   Conditions.  Daughter discussed patient recent hospital admission for intractable  nausea vomiting, abdominal pain , chest pain. She denies patient having complaint of chest pain. She reports that her appetite is not back to usual yet, but no vomiting. Daughter concerned symptoms may return if she doesn't get antibiotic prescribed.   Medication Patient daughter assist patient with medication organization, she received pill packaging on medications and uses [facility] for new prescription.  Patient daughter able to review medication changes at cardiology visit on today.   Appointments  Cardiology 12/21 Melissa Lopez follow up, appointment not scheduled yet daughter agreeable to scheduling.   Patient daughter states that are managing well at home if  they can just get prescriptions filled.    Plan Will plan follow up call to patient in the next 2 business days regarding prescription. Will follow up with PCP office as needed.    Melissa Draft, RN, BSN  [facility]  Management Coordinator  [phone]- Mobile [phone]- Toll Free Main Office

## 2020-11-28 NOTE — Addendum Note (Signed)
Addended by: Merlene Laughter on: 11/28/2020 11:25 AM   Modules accepted: Orders

## 2020-11-28 NOTE — Patient Instructions (Addendum)
Medication Instructions:  Your physician recommends that you continue on your current medications as directed. Please refer to the Current Medication list given to you today.   Labwork: None  Testing/Procedures: None  Follow-Up: Your physician recommends that you schedule a follow-up appointment in: 3 months  Any Other Special Instructions Will Be Listed Below (If Applicable).     If you need a refill on your cardiac medications before your next appointment, please call your pharmacy.   

## 2020-11-29 ENCOUNTER — Other Ambulatory Visit: Payer: Self-pay | Admitting: *Deleted

## 2020-11-29 DIAGNOSIS — I5032 Chronic diastolic (congestive) heart failure: Secondary | ICD-10-CM | POA: Diagnosis not present

## 2020-11-29 DIAGNOSIS — I2510 Atherosclerotic heart disease of native coronary artery without angina pectoris: Secondary | ICD-10-CM | POA: Diagnosis not present

## 2020-11-29 DIAGNOSIS — E1122 Type 2 diabetes mellitus with diabetic chronic kidney disease: Secondary | ICD-10-CM | POA: Diagnosis not present

## 2020-11-29 DIAGNOSIS — N1832 Chronic kidney disease, stage 3b: Secondary | ICD-10-CM | POA: Diagnosis not present

## 2020-11-29 DIAGNOSIS — I130 Hypertensive heart and chronic kidney disease with heart failure and stage 1 through stage 4 chronic kidney disease, or unspecified chronic kidney disease: Secondary | ICD-10-CM | POA: Diagnosis not present

## 2020-11-29 DIAGNOSIS — E1142 Type 2 diabetes mellitus with diabetic polyneuropathy: Secondary | ICD-10-CM | POA: Diagnosis not present

## 2020-11-29 NOTE — Addendum Note (Signed)
Addended by: Joylene Draft A on: 11/29/2020 08:35 AM   Modules accepted: Orders

## 2020-11-29 NOTE — Patient Outreach (Signed)
[facility]) Care Management  11/29/2020  Melissa Lopez [birth year] [id number]  Referral from Joylene Draft, RN for medication assistance sent to [facility].  [facility] Management Assistant [phone]

## 2020-11-29 NOTE — Patient Outreach (Signed)
[facility]) Care Management  11/29/2020  Parker Sawatzky [birth year] [id number]   Red EMMI Flag Care Coordination    Day: 4 Date:11/22/20 Red Alert Reason  Who reached  Patient  Questions about discharge papers?  Yes  Unfilled prescriptions?  Yes  Lost interest in things?  Yes  Sad/hopeless/anxious/empty?  Yes   Patient with admission at [facility] 12/2-12/16/21 Dx: Intractable nausea vomiting, Acute chest pain,Cardiac Cath nonobstructive CAD,  Subjective Outreach call to Dr. Nevada Crane office regarding patient being  unable due to cost to receive  Erythromycin prescribed at discharge due to cost, able to leave a message on nurse line.  [facility] care management pharmacy consult has been placed.  Follow up call to Rosita Kea daughter of patient Sausha Raymond that assist with managing patient care.  She reports patient is doing well on today, reviewed with [name] EMMI responses not addressed at visit on yesterday, [name] states that she answered EMMI call and provided responses. Lost interest in things, sad, hopeless ? [name] states patient has history of anxiety depression and takes medication and is doing okay now. She states tends to get a little down at around this time of year as her sister passed during this time of year.   Discussed with [name] call placed to Dr. Nevada Crane office and [facility] pharmacy consult placed to assist regarding prescribed medication Erythromycin.Reinforced with her scheduling post discharge visit with provider.  Call to [facility] Pharmacist , Josh states when trying to run prescription for  Erythromycin suspension through receives message  needs prior authorization contact provider, possibly related to cost of medication.    Plan Will plan follow up call to patient daughter when additional information from provider office or pharmacy with in the next 4 business days.    Joylene Draft, RN, BSN  [place] Management  Coordinator  [phone]- Mobile [phone]- Toll Free Main Office

## 2020-11-30 DIAGNOSIS — I5032 Chronic diastolic (congestive) heart failure: Secondary | ICD-10-CM | POA: Diagnosis not present

## 2020-11-30 DIAGNOSIS — E210 Primary hyperparathyroidism: Secondary | ICD-10-CM | POA: Diagnosis not present

## 2020-11-30 DIAGNOSIS — N1832 Chronic kidney disease, stage 3b: Secondary | ICD-10-CM | POA: Diagnosis not present

## 2020-11-30 DIAGNOSIS — Z794 Long term (current) use of insulin: Secondary | ICD-10-CM | POA: Diagnosis not present

## 2020-11-30 DIAGNOSIS — E876 Hypokalemia: Secondary | ICD-10-CM | POA: Diagnosis not present

## 2020-11-30 DIAGNOSIS — Z87891 Personal history of nicotine dependence: Secondary | ICD-10-CM | POA: Diagnosis not present

## 2020-11-30 DIAGNOSIS — I130 Hypertensive heart and chronic kidney disease with heart failure and stage 1 through stage 4 chronic kidney disease, or unspecified chronic kidney disease: Secondary | ICD-10-CM | POA: Diagnosis not present

## 2020-11-30 DIAGNOSIS — E1142 Type 2 diabetes mellitus with diabetic polyneuropathy: Secondary | ICD-10-CM | POA: Diagnosis not present

## 2020-11-30 DIAGNOSIS — I495 Sick sinus syndrome: Secondary | ICD-10-CM | POA: Diagnosis not present

## 2020-11-30 DIAGNOSIS — E1122 Type 2 diabetes mellitus with diabetic chronic kidney disease: Secondary | ICD-10-CM | POA: Diagnosis not present

## 2020-11-30 DIAGNOSIS — M6281 Muscle weakness (generalized): Secondary | ICD-10-CM | POA: Diagnosis not present

## 2020-11-30 DIAGNOSIS — Z9181 History of falling: Secondary | ICD-10-CM | POA: Diagnosis not present

## 2020-11-30 DIAGNOSIS — F419 Anxiety disorder, unspecified: Secondary | ICD-10-CM | POA: Diagnosis not present

## 2020-11-30 DIAGNOSIS — F329 Major depressive disorder, single episode, unspecified: Secondary | ICD-10-CM | POA: Diagnosis not present

## 2020-11-30 DIAGNOSIS — Z950 Presence of cardiac pacemaker: Secondary | ICD-10-CM | POA: Diagnosis not present

## 2020-11-30 DIAGNOSIS — K5909 Other constipation: Secondary | ICD-10-CM | POA: Diagnosis not present

## 2020-11-30 DIAGNOSIS — R001 Bradycardia, unspecified: Secondary | ICD-10-CM | POA: Diagnosis not present

## 2020-11-30 DIAGNOSIS — I2510 Atherosclerotic heart disease of native coronary artery without angina pectoris: Secondary | ICD-10-CM | POA: Diagnosis not present

## 2020-11-30 DIAGNOSIS — R112 Nausea with vomiting, unspecified: Secondary | ICD-10-CM | POA: Diagnosis not present

## 2020-11-30 DIAGNOSIS — Z7982 Long term (current) use of aspirin: Secondary | ICD-10-CM | POA: Diagnosis not present

## 2020-12-04 ENCOUNTER — Other Ambulatory Visit: Payer: Self-pay | Admitting: *Deleted

## 2020-12-04 NOTE — Patient Outreach (Signed)
[facility]) Care Management  12/04/2020  Brya Simerly 1951-09-19 [id number]   Red EMMI  Follow up call    Day: 4 Date:11/22/20 Red Alert Reason  Who reached  Patient  Questions about discharge papers?  Yes  Unfilled prescriptions?  Yes  Lost interest in things?  Yes  Sad/hopeless/anxious/empty?  Yes    Outreach Attempt Unsuccessful follow up call to patient/daughter Rosita Kea., no answer able to leave a HIPAA compliant message for return call.    Plan If no return call will plan outreach attempt in the next 4 business days.    Joylene Draft, RN, BSN  [place] Management Coordinator  [phone]- Mobile [phone]- Toll Free Main Office

## 2020-12-07 ENCOUNTER — Other Ambulatory Visit: Payer: Self-pay | Admitting: *Deleted

## 2020-12-07 ENCOUNTER — Encounter: Payer: Self-pay | Admitting: *Deleted

## 2020-12-07 NOTE — Patient Outreach (Signed)
[facility]) Care Management  12/07/2020  Morrigan Wickens 1951/06/30 [id number]   Red EMMI  Follow up call   Patient with admission at [facility] 12/2-12/16/21 Dx: Intractable nausea vomiting, Acute chest pain,Cardiac Cath nonobstructive CAD,   Day: 4 Date:11/22/20 Red Alert Reason: Who reached  Patient  Questions about discharge papers?  Yes  Unfilled prescriptions?  Yes  Lost interest in things?  Yes  Sad/hopeless/anxious/empty?  Yes   Outreach Attempt #2 Successful outreach call to patient/daughter, [name] daughter states that she hasn't heard from MD regarding medication prescribed at discharge that she has not been able to receive.  Discussed with daughter regarding importance of patient post discharge follow up with provider for review of medication and plan.  she states that she has not scheduled appointment. I offered to call provider during this call to arrange an appointment she states that she will do it.  [name] states patient has wheelchair for use if needed, no steps to exit home at back door .Patient has medicaid transportation services that she has used in the past to transport by wheelchair accessible Lucianne Lei.   [name]  reports patient has fallen x 2 this week, no injury did not hit her head. Patient using walker for mobility, she has home health physical therapy visit on today. She reports monitoring patient blood pressure last reading 120/81, blood sugar 130's.  Daughter concerned with patient decreased appetite, intake, she is tolerating liquids well, no nausea or vomiting.  patient states that she is just not hungry, denies abdominal pain.    Again discussed [facility] care management services for managing chronic conditions, daughter/patient states they will let me know if agreeable to ongoing follow up. Agreeable to follow up call in the next week.    Plan Will plan follow up EMMI call in the next week as patient and daughter agreeable.  Stressed importance of MD follow up, and notifying of worsening symptoms, falls concern for injury.    Joylene Draft, RN, BSN  [place] Management Coordinator  [phone]- Mobile [phone]- Toll Free Main Office

## 2020-12-14 DIAGNOSIS — R112 Nausea with vomiting, unspecified: Secondary | ICD-10-CM | POA: Diagnosis not present

## 2020-12-14 DIAGNOSIS — R1084 Generalized abdominal pain: Secondary | ICD-10-CM | POA: Diagnosis not present

## 2020-12-19 ENCOUNTER — Other Ambulatory Visit: Payer: Self-pay | Admitting: *Deleted

## 2020-12-19 NOTE — Patient Outreach (Addendum)
[facility]) Care Management  12/19/2020  Melissa Lopez 09/25/1951 [id number]   EMMI Red flag Alert Bettina Gavia up  Day: 4 Date:11/22/20 Red Alert Reason Who reached  Patient  Questions about discharge papers?  Yes  Unfilled prescriptions?  Yes  Lost interest in things?  Yes  Sad/hopeless/anxious/empty?  Yes   Patient with admission at [facility] 12/2-12/16/21 Dx: Intractable nausea vomiting, Acute chest pain,Cardiac Cath nonobstructive CAD,  Subjective: Unsuccessful outreach call to patient/daughter for follow up on EMMI red alert and engagement for ongoing [facility] care management services for chronic conditions.    Plan If no return call on today , will plan follow up outreach in the next 4 business days.    Joylene Draft, RN, BSN  [place] Management Coordinator  [phone]- Mobile [phone]- Toll Free Main Office

## 2020-12-22 ENCOUNTER — Other Ambulatory Visit: Payer: Self-pay | Admitting: *Deleted

## 2020-12-22 NOTE — Patient Outreach (Signed)
[facility]) Care Management  12/22/2020  Melissa Lopez 16-Jul-1951 [id number]   EMMI Red flag Alert Bettina Gavia up  Day: 4 Date:11/22/20 Red Alert Reason Who reached  Patient  Questions about discharge papers?  Yes  Unfilled prescriptions?  Yes  Lost interest in things?  Yes  Sad/hopeless/anxious/empty?  Yes   Patient with admission at [facility] 12/2-12/16/21 Dx: Intractable nausea vomiting, Acute chest pain,Cardiac Cath nonobstructive CAD,    Outreach  Subjective; Successful outreach call to patient daughter Melissa Lopez, designated party release. Discussed recent EMMI call response of unfilled prescription, [name] states that they now have the prescription in pill form instead of liquid as prescribed at discharge.  She reports that patient is doing much better, she has improved with appetite and intake. She reports that she is moving around much better. She states patient has recent telehealth visit with Dr. Nevada Crane.   Discussed [facility] care management services  for ongoing education and support  on chronic conditions, daughter declines needs at this time states they are managing well at home.   Plan Will plan case closure to [facility] care management services at this time.  EMMI red flag alerts have been addressed.   Joylene Draft, RN, BSN  [place] Management Coordinator  [phone]- Mobile [phone]- Toll Free Main Office

## 2020-12-27 ENCOUNTER — Ambulatory Visit: Payer: Medicare Other | Admitting: "Endocrinology

## 2021-01-11 ENCOUNTER — Ambulatory Visit (INDEPENDENT_AMBULATORY_CARE_PROVIDER_SITE_OTHER): Payer: Medicare (Managed Care)

## 2021-01-11 DIAGNOSIS — I495 Sick sinus syndrome: Secondary | ICD-10-CM | POA: Diagnosis not present

## 2021-01-11 LAB — CUP PACEART REMOTE DEVICE CHECK
Battery Remaining Longevity: 126 mo
Battery Remaining Percentage: 95.5 %
Battery Voltage: 2.99 V
Brady Statistic AP VP Percent: 58 %
Brady Statistic AP VS Percent: 39 %
Brady Statistic AS VP Percent: 1 %
Brady Statistic AS VS Percent: 2.5 %
Brady Statistic RA Percent Paced: 97 %
Brady Statistic RV Percent Paced: 58 %
Date Time Interrogation Session: 20220203020013
Implantable Lead Implant Date: 20110113
Implantable Lead Implant Date: 20110113
Implantable Lead Location: 753859
Implantable Lead Location: 753860
Implantable Pulse Generator Implant Date: 20191008
Lead Channel Impedance Value: 460 Ohm
Lead Channel Impedance Value: 630 Ohm
Lead Channel Pacing Threshold Amplitude: 0.625 V
Lead Channel Pacing Threshold Amplitude: 1 V
Lead Channel Pacing Threshold Pulse Width: 0.4 ms
Lead Channel Pacing Threshold Pulse Width: 0.4 ms
Lead Channel Sensing Intrinsic Amplitude: 12 mV
Lead Channel Sensing Intrinsic Amplitude: 3.9 mV
Lead Channel Setting Pacing Amplitude: 1.25 V
Lead Channel Setting Pacing Amplitude: 1.625
Lead Channel Setting Pacing Pulse Width: 0.4 ms
Lead Channel Setting Sensing Sensitivity: 2 mV
Pulse Gen Model: 2272
Pulse Gen Serial Number: 9064940

## 2021-01-17 ENCOUNTER — Ambulatory Visit: Payer: Medicare Other | Admitting: Podiatry

## 2021-01-17 NOTE — Progress Notes (Signed)
Remote pacemaker transmission.   

## 2021-03-07 ENCOUNTER — Ambulatory Visit: Payer: Medicare Other | Admitting: Cardiology

## 2021-03-07 NOTE — Progress Notes (Deleted)
Clinical Summary Melissa Lopez is a 70 y.o.female former patient of Dr Bronson Ing, this is our first visit together.  1. CAD - 2019 cath as reported below, essentially small vessel and mild to  disease treated medically -12/ 2021 cath stable disease -11/2020 echo LVEF 60-65%, no WMAs, grade I dd    2. 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 CKD 3 Past Medical History:  Diagnosis Date  . CKD (chronic kidney disease) stage 3, GFR 30-59 ml/min (HCC) 10/31/2018   - per patient report  . Coronary artery disease    per patient report - 2 caths with non-obstructive CAD  . Essential hypertension   . H/O cardiac pacemaker 10/31/2018  . Sick sinus syndrome ([place])   . Type 2 diabetes mellitus with complication, with long-term current use of insulin ([place]) 10/31/2018   with diabetic nephropathy     Allergies  Allergen Reactions  . Contrast Media [Iodinated Diagnostic Agents] Swelling    Contrast Dye - the one made out of shellfish, gives a really bad reaction - swelling  . Oxycodone-Acetaminophen Anaphylaxis  . Xarelto  [Rivaroxaban] Swelling  . Oxycodone Other (See Comments)    Statused by Person:  Lopez, CAMELIA(CE) on [id number]     Current Outpatient Medications  Medication Sig Dispense Refill  . acetaminophen (TYLENOL) 650 MG CR tablet Take 1,300 mg by mouth every 8 (eight) hours as needed for pain.    Marland Kitchen amLODipine (NORVASC) 10 MG tablet TAKE 1 TABLET BY MOUTH EVERY DAY 90 tablet 3  . aspirin 81 MG chewable tablet Chew 1 tablet (81 mg total) by mouth daily. 30 tablet 0  . buPROPion (WELLBUTRIN SR) 150 MG 12 hr tablet Take 150 mg by mouth daily.    . carvedilol (COREG) 6.25 MG tablet Take 6.25 mg by mouth 2 (two) times daily with a meal.    . escitalopram (LEXAPRO) 20 MG tablet Take 20 mg by mouth daily.    Marland Kitchen gabapentin (NEURONTIN) 100 MG capsule Take 100 mg by mouth 3 (three) times daily.    . hydrALAZINE (APRESOLINE) 25 MG tablet Take 25 mg by mouth at  bedtime.    . insulin degludec (TRESIBA FLEXTOUCH) 100 UNIT/ML FlexTouch Pen Inject 5 Units into the skin daily.    Marland Kitchen lisinopril (ZESTRIL) 20 MG tablet Take 1 tablet (20 mg total) by mouth daily. 90 tablet 3  . LORazepam (ATIVAN) 1 MG tablet Take 1 mg by mouth daily as needed for anxiety.    . metoCLOPramide (REGLAN) 5 MG tablet Take 1 tablet (5 mg total) by mouth 3 (three) times daily for 14 days. 42 tablet 0  . Multiple Vitamin (MULTIVITAMIN WITH MINERALS) TABS tablet Take 1 tablet by mouth daily.    . Nutritional Supplements (,FEEDING SUPPLEMENT, PROSOURCE PLUS) liquid Take 30 mLs by mouth daily. 887 mL 0  . omeprazole (PRILOSEC) 20 MG capsule Take 20 mg by mouth daily.    . ondansetron (ZOFRAN) 4 MG tablet Take 1 tablet (4 mg total) by mouth every 6 (six) hours. (Patient not taking: Reported on 11/28/2020) 12 tablet 0  . oxybutynin (DITROPAN-XL) 10 MG 24 hr tablet Take 10 mg by mouth daily.     . polyethylene glycol (MIRALAX / GLYCOLAX) 17 g packet Take 17 g by mouth 2 (two) times daily. 14 each 0  . rosuvastatin (CRESTOR) 5 MG tablet Take one tablet daily 90 tablet 3  . traZODone (DESYREL) 100 MG tablet Take 100 mg by  mouth at bedtime.     No current facility-administered medications for this visit.     Past Surgical History:  Procedure Laterality Date  . BIOPSY  09/28/2020   Procedure: BIOPSY;  Surgeon: Ronald Lobo, MD;  Location: [facility];  Service: Endoscopy;;  . CARDIAC PACEMAKER PLACEMENT  2008   in [place] for sick sinus syndrome  . CORONARY/GRAFT ACUTE MI REVASCULARIZATION N/A 11/09/2020   Procedure: CORONARY/GRAFT ACUTE MI REVASCULARIZATION;  Surgeon: Leonie Man, MD;  Location: [place];  Service: Cardiovascular;  Laterality: N/A;  . ESOPHAGOGASTRODUODENOSCOPY N/A 09/28/2020   Procedure: ESOPHAGOGASTRODUODENOSCOPY (EGD);  Surgeon: Ronald Lobo, MD;  Location: [facility];  Service: Endoscopy;  Laterality: N/A;  . ESOPHAGOGASTRODUODENOSCOPY  (EGD) WITH PROPOFOL N/A 11/10/2018   Procedure: ESOPHAGOGASTRODUODENOSCOPY (EGD) WITH PROPOFOL;  Surgeon: Wilford Corner, MD;  Location: [facility];  Service: Endoscopy;  Laterality: N/A;  . LEFT HEART CATH AND CORONARY ANGIOGRAPHY N/A 10/31/2018   Procedure: LEFT HEART CATH AND CORONARY ANGIOGRAPHY;  Surgeon: Leonie Man, MD;  Location: [place] [facility];  Service: Cardiovascular;  Laterality: N/A;  . PACEMAKER GENERATOR CHANGE  09/2018   in [place]     Allergies  Allergen Reactions  . Contrast Media [Iodinated Diagnostic Agents] Swelling    Contrast Dye - the one made out of shellfish, gives a really bad reaction - swelling  . Oxycodone-Acetaminophen Anaphylaxis  . Xarelto  [Rivaroxaban] Swelling  . Oxycodone Other (See Comments)    Statused by Person:  Lopez, CAMELIA(CE) on [id number]      Family History  Problem Relation Age of Onset  . Breast cancer Mother   . Bone cancer Mother   . Heart attack Father 62  . Breast cancer Sister   . Heart disease Brother        stents  . Other Sister        MVA  . Diabetes Brother      Social History Ms. Goswick reports that she quit smoking about 51 years ago. She quit after 3.00 years of use. She has never used smokeless tobacco. Ms. Seckel reports previous alcohol use.   Review of Systems CONSTITUTIONAL: No weight loss, fever, chills, weakness or fatigue.  HEENT: Eyes: No visual loss, blurred vision, double vision or yellow sclerae.No hearing loss, sneezing, congestion, runny nose or sore throat.  SKIN: No rash or itching.  CARDIOVASCULAR:  RESPIRATORY: No shortness of breath, cough or sputum.  GASTROINTESTINAL: No anorexia, nausea, vomiting or diarrhea. No abdominal pain or blood.  GENITOURINARY: No burning on urination, no polyuria NEUROLOGICAL: No headache, dizziness, syncope, paralysis, ataxia, numbness or tingling in the extremities. No change in bowel or bladder control.  MUSCULOSKELETAL: No  muscle, back pain, joint pain or stiffness.  LYMPHATICS: No enlarged nodes. No history of splenectomy.  PSYCHIATRIC: No history of depression or anxiety.  ENDOCRINOLOGIC: No reports of sweating, cold or heat intolerance. No polyuria or polydipsia.  Marland Kitchen   Physical Examination There were no vitals filed for this visit. There were no vitals filed for this visit.  Gen: resting comfortably, no acute distress HEENT: no scleral icterus, pupils equal round and reactive, no palptable cervical adenopathy,  CV Resp: Clear to auscultation bilaterally GI: abdomen is soft, non-tender, non-distended, normal bowel sounds, no hepatosplenomegaly MSK: extremities are warm, no edema.  Skin: warm, no rash Neuro:  no focal deficits Psych: appropriate affect   Diagnostic Studies Cardiac catheterization 56/21/3086:  LV end diastolic pressure is mildly elevated.  Ost Ramus  lesion is 70% stenosed -relatively small caliber vessel. Not likely because of resting chest pain and ST elevations.  Ost 1st Diag lesion is 60% stenosed. -Not likely flow-limiting.  Very small caliber (<12mm) 1st Mrg lesion appears to be 100% stenosed with faint collateralization. Too small for angioplasty or stenting.  SUMMARY:  Angiographically mild diffuse disease, only potentially occluded vessels are very very small Melissa Lopez of the major obtuse marginal. Not likely a culprit lesion for significant anginal chest pain.  Mild to moderately elevated LVEDP. LV gram not performed due to renal insufficiency.  Echocardiogram 10/31/2018: Study Conclusions  - Left ventricle: The cavity size was normal. Wall thickness was increased in a pattern of severe LVH. Systolic function was normal. The estimated ejection fraction was in the range of 55% to 60%. There is hypokinesis of the apical myocardium. Doppler parameters are consistent with abnormal left ventricular relaxation (grade 1 diastolic dysfunction). - Pericardium,  extracardiac: A trivial pericardial effusion was identified.  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  11/2020 cath  1st Mrg lesion is 100% stenosed.  Ost Ramus lesion is 65% stenosed.  Ost LAD to Prox LAD lesion is 15% stenosed with 60% stenosed side Melissa Lopez in Ost 1st Diag.  Ost Cx to Prox Cx lesion is 45% stenosed.  Diffuse mild CAD and very tortuous vessels. No true occlusive lesions. None   SUMMARY  Stable nonocclusive mild to moderate disease with no culprit lesion for ST elevation MI. ->  The true diagnosis is no longer STEMI -> very similar findings with that very similar presentation 2 years ago.  EF not assessed due to plan to conserve contrast.  Relatively low LVEDP-IV given 500 mL bolus.   PLAN  Due to bed availability, she will be admitted to CT ICU, but could be transferred if necessary  We will consult [facility] medicine service to assist with her severely elevated white blood cell count, already on antibiotics for possible UTI as well as profound nausea vomiting and hyperglycemia. ->  Concern for possible HONK (hyperosmotic nonketotic ketoacidosis    Assessment and Plan        Arnoldo Lenis, M.D., F.A.C.C.

## 2021-03-13 ENCOUNTER — Other Ambulatory Visit: Payer: Self-pay | Admitting: Internal Medicine

## 2021-03-13 ENCOUNTER — Ambulatory Visit
Admission: RE | Admit: 2021-03-13 | Discharge: 2021-03-13 | Disposition: A | Discharge status: Home / Self Care | Payer: Medicare (Managed Care) | Source: Ambulatory Visit | Attending: Internal Medicine | Admitting: Internal Medicine

## 2021-03-13 ENCOUNTER — Other Ambulatory Visit: Payer: Self-pay

## 2021-03-13 DIAGNOSIS — S0990XA Unspecified injury of head, initial encounter: Secondary | ICD-10-CM

## 2021-04-06 ENCOUNTER — Other Ambulatory Visit: Payer: Self-pay | Admitting: Internal Medicine

## 2021-04-06 DIAGNOSIS — W19XXXA Unspecified fall, initial encounter: Secondary | ICD-10-CM

## 2021-04-06 DIAGNOSIS — S0990XA Unspecified injury of head, initial encounter: Secondary | ICD-10-CM

## 2021-04-11 ENCOUNTER — Other Ambulatory Visit: Payer: Self-pay

## 2021-04-11 ENCOUNTER — Emergency Department (HOSPITAL_COMMUNITY)
Admission: EM | Admit: 2021-04-11 | Discharge: 2021-04-11 | Disposition: A | Discharge status: Home / Self Care | Payer: Medicare (Managed Care) | Attending: Emergency Medicine | Admitting: Emergency Medicine

## 2021-04-11 ENCOUNTER — Ambulatory Visit
Admission: RE | Admit: 2021-04-11 | Discharge: 2021-04-11 | Disposition: A | Discharge status: Home / Self Care | Payer: Medicare (Managed Care) | Source: Ambulatory Visit | Attending: Internal Medicine | Admitting: Internal Medicine

## 2021-04-11 DIAGNOSIS — S0512XA Contusion of eyeball and orbital tissues, left eye, initial encounter: Secondary | ICD-10-CM | POA: Insufficient documentation

## 2021-04-11 DIAGNOSIS — W19XXXA Unspecified fall, initial encounter: Secondary | ICD-10-CM | POA: Diagnosis not present

## 2021-04-11 DIAGNOSIS — R55 Syncope and collapse: Secondary | ICD-10-CM | POA: Insufficient documentation

## 2021-04-11 DIAGNOSIS — Z5321 Procedure and treatment not carried out due to patient leaving prior to being seen by health care provider: Secondary | ICD-10-CM | POA: Insufficient documentation

## 2021-04-11 DIAGNOSIS — S0592XA Unspecified injury of left eye and orbit, initial encounter: Secondary | ICD-10-CM | POA: Diagnosis present

## 2021-04-11 DIAGNOSIS — S0990XA Unspecified injury of head, initial encounter: Secondary | ICD-10-CM

## 2021-04-11 NOTE — ED Notes (Signed)
Pt called [facility] for [facility] to come pick her up to go home w/o being seen. She stated she was waiting a long time and hadn't seen anyone yet. Moving pt OTF.

## 2021-04-11 NOTE — ED Triage Notes (Signed)
Pt had fall 2 days ago preceded by dizziness. +LOC. Went for CT at [place] today and was sent here for further eval. Intermittent dizziness continues today. Hematoma over L eye. Denies blood thinners.

## 2021-04-12 ENCOUNTER — Ambulatory Visit (INDEPENDENT_AMBULATORY_CARE_PROVIDER_SITE_OTHER): Payer: Medicare (Managed Care)

## 2021-04-12 DIAGNOSIS — I495 Sick sinus syndrome: Secondary | ICD-10-CM | POA: Diagnosis not present

## 2021-04-12 LAB — CUP PACEART REMOTE DEVICE CHECK
Battery Remaining Longevity: 125 mo
Battery Remaining Percentage: 95.5 %
Battery Voltage: 2.99 V
Brady Statistic AP VP Percent: 60 %
Brady Statistic AP VS Percent: 37 %
Brady Statistic AS VP Percent: 1 %
Brady Statistic AS VS Percent: 2.3 %
Brady Statistic RA Percent Paced: 97 %
Brady Statistic RV Percent Paced: 60 %
Date Time Interrogation Session: 20220505020025
Implantable Lead Implant Date: 20110113
Implantable Lead Implant Date: 20110113
Implantable Lead Location: 753859
Implantable Lead Location: 753860
Implantable Pulse Generator Implant Date: 20191008
Lead Channel Impedance Value: 480 Ohm
Lead Channel Impedance Value: 690 Ohm
Lead Channel Pacing Threshold Amplitude: 0.75 V
Lead Channel Pacing Threshold Amplitude: 1.125 V
Lead Channel Pacing Threshold Pulse Width: 0.4 ms
Lead Channel Pacing Threshold Pulse Width: 0.4 ms
Lead Channel Sensing Intrinsic Amplitude: 12 mV
Lead Channel Sensing Intrinsic Amplitude: 4.8 mV
Lead Channel Setting Pacing Amplitude: 1.375
Lead Channel Setting Pacing Amplitude: 1.75 V
Lead Channel Setting Pacing Pulse Width: 0.4 ms
Lead Channel Setting Sensing Sensitivity: 2 mV
Pulse Gen Model: 2272
Pulse Gen Serial Number: 9064940

## 2021-04-16 ENCOUNTER — Encounter: Payer: Self-pay | Admitting: Diagnostic Neuroimaging

## 2021-04-16 ENCOUNTER — Ambulatory Visit (INDEPENDENT_AMBULATORY_CARE_PROVIDER_SITE_OTHER): Payer: Medicare (Managed Care) | Admitting: Diagnostic Neuroimaging

## 2021-04-16 VITALS — BP 116/77 | HR 62 | Ht 64.0 in | Wt 150.0 lb

## 2021-04-16 DIAGNOSIS — R42 Dizziness and giddiness: Secondary | ICD-10-CM

## 2021-04-16 DIAGNOSIS — R269 Unspecified abnormalities of gait and mobility: Secondary | ICD-10-CM | POA: Diagnosis not present

## 2021-04-16 NOTE — Progress Notes (Signed)
[facility]  PATIENT: Melissa Lopez DOB: 1951-04-08  REFERRING CLINICIAN: Addison Lank, MD HISTORY FROM: patient  REASON FOR VISIT: new consult    HISTORICAL  CHIEF COMPLAINT:  Chief Complaint  Patient presents with  . Recurrent falls    Rm 7 New Pt dgtr- [name]  "three major falls in < 2 weeks preceded by dizziness/knees buckling, doesn't respond and doesn't remember;  hit head; CT scans ok; freq falls have occurred in past; has done PT several times"     HISTORY OF PRESENT ILLNESS:   70 year old female here for evaluation of frequent falls.  History of coronary disease, sick sinus syndrome, pacemaker, diabetes, hypertension, gastroparesis, chronic nausea and vomiting, chronic THC use, chronic pain.  November 2019 patient was living in [place] with daughter and moved to [place] to be closer to family.  Since that time she has had being more falls.  In December 2019 she had a heart attack.  Symptoms continue to worsen since that time.  Now she is having falls every 2 to 3 months, sometimes several times a week.  She also has had 60 to 80 pound weight loss over the past few years.  She has had continued physical decline.  Recently she fell in the middle the night when she was using her bedside commode, striking her left forehead and eye on the bed rail.      REVIEW OF SYSTEMS: Full 14 system review of systems performed and negative with exception of: As per HPI.  ALLERGIES: Allergies  Allergen Reactions  . Contrast Media [Iodinated Diagnostic Agents] Swelling    Contrast Dye - the one made out of shellfish, gives a really bad reaction - swelling  . Oxycodone-Acetaminophen Anaphylaxis  . Xarelto  [Rivaroxaban] Swelling  . Oxycodone Other (See Comments)    Statused by Person:  EAST, CAMELIA(CE) on [phone]    HOME MEDICATIONS: Outpatient Medications Prior to Visit  Medication Sig Dispense Refill  . acetaminophen (TYLENOL) 650 MG CR tablet  Take 1,300 mg by mouth every 8 (eight) hours as needed for pain.    Marland Kitchen aspirin 81 MG chewable tablet Chew 1 tablet (81 mg total) by mouth daily. 30 tablet 0  . buPROPion (WELLBUTRIN SR) 150 MG 12 hr tablet Take 150 mg by mouth daily.    . carvedilol (COREG) 6.25 MG tablet Take 6.25 mg by mouth 2 (two) times daily with a meal.    . escitalopram (LEXAPRO) 20 MG tablet Take 20 mg by mouth daily.    Marland Kitchen gabapentin (NEURONTIN) 100 MG capsule Take 100 mg by mouth 3 (three) times daily.    . hydrALAZINE (APRESOLINE) 25 MG tablet Take 25 mg by mouth at bedtime.    . insulin degludec (TRESIBA FLEXTOUCH) 100 UNIT/ML FlexTouch Pen Inject 5 Units into the skin daily.    Marland Kitchen lisinopril (ZESTRIL) 20 MG tablet Take 1 tablet (20 mg total) by mouth daily. 90 tablet 3  . metoCLOPramide (REGLAN) 5 MG tablet Take 1 tablet (5 mg total) by mouth 3 (three) times daily for 14 days. 42 tablet 0  . Multiple Vitamin (MULTIVITAMIN WITH MINERALS) TABS tablet Take 1 tablet by mouth daily.    . Nutritional Supplements (,FEEDING SUPPLEMENT, PROSOURCE PLUS) liquid Take 30 mLs by mouth daily. 887 mL 0  . omeprazole (PRILOSEC) 20 MG capsule Take 20 mg by mouth daily.    . ondansetron (ZOFRAN) 4 MG tablet Take 1 tablet (4 mg total) by mouth every 6 (six) hours. 12  tablet 0  . oxybutynin (DITROPAN-XL) 10 MG 24 hr tablet Take 10 mg by mouth daily.     . polyethylene glycol (MIRALAX / GLYCOLAX) 17 g packet Take 17 g by mouth 2 (two) times daily. 14 each 0  . rosuvastatin (CRESTOR) 5 MG tablet Take one tablet daily 90 tablet 3  . traZODone (DESYREL) 100 MG tablet Take 100 mg by mouth at bedtime.    Marland Kitchen amLODipine (NORVASC) 10 MG tablet TAKE 1 TABLET BY MOUTH EVERY DAY (Patient not taking: Reported on 04/16/2021) 90 tablet 3  . LORazepam (ATIVAN) 1 MG tablet Take 1 mg by mouth daily as needed for anxiety. (Patient not taking: Reported on 04/16/2021)     No facility-administered medications prior to visit.    PAST MEDICAL HISTORY: Past Medical  History:  Diagnosis Date  . CKD (chronic kidney disease) stage 3, GFR 30-59 ml/min (HCC) 10/31/2018   - per patient report  . Coronary artery disease    per patient report - 2 caths with non-obstructive CAD  . Essential hypertension   . Frequent falls   . H/O cardiac pacemaker 10/31/2018   implanted 05/2010  . Sick sinus syndrome ([place])   . Type 2 diabetes mellitus with complication, with long-term current use of insulin ([place]) 10/31/2018   with diabetic nephropathy    PAST SURGICAL HISTORY: Past Surgical History:  Procedure Laterality Date  . BIOPSY  09/28/2020   Procedure: BIOPSY;  Surgeon: Ronald Lobo, MD;  Location: [facility];  Service: Endoscopy;;  . CARDIAC PACEMAKER PLACEMENT  2008   in [place] for sick sinus syndrome  . CORONARY/GRAFT ACUTE MI REVASCULARIZATION N/A 11/09/2020   Procedure: CORONARY/GRAFT ACUTE MI REVASCULARIZATION;  Surgeon: Leonie Man, MD;  Location: [facility];  Service: Cardiovascular;  Laterality: N/A;  . ESOPHAGOGASTRODUODENOSCOPY N/A 09/28/2020   Procedure: ESOPHAGOGASTRODUODENOSCOPY (EGD);  Surgeon: Ronald Lobo, MD;  Location: [facility];  Service: Endoscopy;  Laterality: N/A;  . ESOPHAGOGASTRODUODENOSCOPY (EGD) WITH PROPOFOL N/A 11/10/2018   Procedure: ESOPHAGOGASTRODUODENOSCOPY (EGD) WITH PROPOFOL;  Surgeon: Wilford Corner, MD;  Location: [facility];  Service: Endoscopy;  Laterality: N/A;  . LEFT HEART CATH AND CORONARY ANGIOGRAPHY N/A 10/31/2018   Procedure: LEFT HEART CATH AND CORONARY ANGIOGRAPHY;  Surgeon: Leonie Man, MD;  Location: [facility];  Service: Cardiovascular;  Laterality: N/A;  . PACEMAKER GENERATOR CHANGE  09/2018   in [place]    FAMILY HISTORY: Family History  Problem Relation Age of Onset  . Breast cancer Mother   . Bone cancer Mother   . Heart attack Father 9  . Breast cancer Sister   . Heart disease Brother        stents  . Other Sister        MVA  . Diabetes  Brother     SOCIAL HISTORY: Social History   Socioeconomic History  . Marital status: Single    Spouse name: Not on file  . Number of children: 4  . Years of education: Not on file  . Highest education level: Some college, no degree  Occupational History    Comment: retired  Tobacco Use  . Smoking status: Former Smoker    Years: 3.00    Quit date: 12/09/1969    Years since quitting: 51.3  . Smokeless tobacco: Never Used  Substance and Sexual Activity  . Alcohol use: Not Currently  . Drug use: Not Currently    Comment: as teenager  . Sexual activity: Not on file  Other Topics Concern  . Not on file  Social History Narrative   04/16/21 Moved from [place] and lives with daughter, [name] in [place].   Now in [employer]   Social Determinants of Health   Financial Resource Strain: Not on file  Food Insecurity: Not on file  Transportation Needs: Not on file  Physical Activity: Not on file  Stress: Not on file  Social Connections: Not on file  Intimate Partner Violence: Not on file     PHYSICAL EXAM  GENERAL EXAM/CONSTITUTIONAL: Vitals:  Vitals:   04/16/21 0912  BP: 116/77  Pulse: 62  Weight: 150 lb (68 kg)  Height: 5\' 4"  (1.626 m)     Body mass index is 25.75 kg/m. Wt Readings from Last 3 Encounters:  04/16/21 150 lb (68 kg)  11/28/20 145 lb (65.8 kg)  11/09/20 151 lb 7.3 oz (68.7 kg)     Patient is in no distress; well developed, nourished and groomed; neck is supple  CARDIOVASCULAR:  Examination of carotid arteries is normal; no carotid bruits  Regular rate and rhythm, no murmurs  Examination of peripheral vascular system by observation and palpation is normal  EYES:  Ophthalmoscopic exam of optic discs and posterior segments is normal; no papilledema or hemorrhages  No exam data present  MUSCULOSKELETAL:  Gait, strength, tone, movements noted in Neurologic exam below  NEUROLOGIC: MENTAL STATUS:  No flowsheet data found.  awake, alert, oriented to  person, place and time  recent and remote memory intact  normal attention and concentration  language fluent, comprehension intact, naming intact  fund of knowledge appropriate  CRANIAL NERVE:   2nd - no papilledema on fundoscopic exam  2nd, 3rd, 4th, 6th - pupils equal and reactive to light, visual fields full to confrontation, extraocular muscles intact, no nystagmus; BLURRED VISION FROM LEFT EYE; SIG EDEMA AND ERYTHEMA IN LEFT SCLERA AND PERI-ORBITAL REGION  5th - facial sensation symmetric  7th - facial strength symmetric  8th - hearing intact  9th - palate elevates symmetrically, uvula midline  11th - shoulder shrug symmetric  12th - tongue protrusion midline  ORO-LINGUAL DYSKINESIAS  MOTOR:   normal bulk and tone, full strength in the BUE  BLE 3 PROX, 4 DISTAL  SENSORY:   normal and symmetric to light touch, temperature, vibration; EXCEPT DECR IN LEFT HAND AND BILATERAL FEET  COORDINATION:   finger-nose-finger, fine finger movements SLOW  REFLEXES:   deep tendon reflexes 1+ and symmetric  GAIT/STATION:   SLOW TO RISE; USES CANE; VERY UNSTEADY; SHORT SHUFFLING STEPS; SLOW TURNING     DIAGNOSTIC DATA (LABS, IMAGING, TESTING) - I reviewed patient records, labs, notes, testing and imaging myself where available.  Lab Results  Component Value Date   WBC 13.4 (H) 11/15/2020   HGB 13.6 11/15/2020   HCT 39.2 11/15/2020   MCV 87.7 11/15/2020   PLT 198 11/15/2020      Component Value Date/Time   NA 135 11/17/2020 0130   K 3.2 (L) 11/17/2020 0130   CL 105 11/17/2020 0130   CO2 21 (L) 11/17/2020 0130   GLUCOSE 125 (H) 11/17/2020 0130   BUN 10 11/17/2020 0130   CREATININE 1.54 (H) 11/17/2020 0130   CALCIUM 11.2 (H) 11/17/2020 0130   CALCIUM 13.5 (HH) 09/22/2020 2032   PROT 6.7 11/10/2020 0040   ALBUMIN 3.5 11/10/2020 0040   AST 14 (L) 11/10/2020 0040   ALT 13 11/10/2020 0040   ALKPHOS 102 11/10/2020 0040   BILITOT 0.7 11/10/2020 0040  GFRNONAA 36 (L) 11/17/2020 0130   GFRAA 34 (L) 11/12/2018 0342   Lab Results  Component Value Date   CHOL 153 11/01/2018   HDL 24 (L) 11/01/2018   LDLCALC 94 11/01/2018   TRIG 175 (H) 11/01/2018   CHOLHDL 6.4 11/01/2018   Lab Results  Component Value Date   HGBA1C 6.5 (H) 09/24/2020   No results found for: VITAMINB12 Lab Results  Component Value Date   TSH 2.207 09/22/2020    04/11/21 CT head [I reviewed images myself and agree with interpretation. -VRP]  1. No acute intracranial abnormalities. 2. Moderate chronic microvascular ischemic change. Old right thalamic lacunar infarct. 3. Significant left periorbital hemorrhage/swelling. No injury to the left globe or postseptal orbit. No fractures.     ASSESSMENT AND PLAN  70 y.o. year old female here with:  Dx:  1. Gait difficulty   2. 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   I spent 67 minutes of face-to-face and non-face-to-face time with patient.  This included previsit chart review, lab review, study review, order entry, electronic health record documentation, patient education.      Penni Bombard, MD 02/09/3544, 6:25 AM Certified in Neurology, Neurophysiology and Neuroimaging  [facility] [address] [place], [place] [address] [phone]

## 2021-04-16 NOTE — Patient Instructions (Addendum)
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

## 2021-05-02 NOTE — Progress Notes (Signed)
Remote pacemaker transmission.   

## 2021-05-24 ENCOUNTER — Ambulatory Visit
Admission: RE | Admit: 2021-05-24 | Discharge: 2021-05-24 | Disposition: A | Discharge status: Home / Self Care | Payer: Medicare (Managed Care) | Source: Ambulatory Visit | Attending: Internal Medicine | Admitting: Internal Medicine

## 2021-05-24 ENCOUNTER — Other Ambulatory Visit: Payer: Self-pay | Admitting: Internal Medicine

## 2021-05-24 DIAGNOSIS — M545 Low back pain, unspecified: Secondary | ICD-10-CM

## 2021-05-25 ENCOUNTER — Ambulatory Visit: Payer: Medicare (Managed Care) | Admitting: Cardiology

## 2021-05-28 ENCOUNTER — Encounter: Payer: Medicare (Managed Care) | Admitting: Student

## 2021-06-12 ENCOUNTER — Emergency Department (HOSPITAL_COMMUNITY): Payer: Medicare (Managed Care)

## 2021-06-12 ENCOUNTER — Inpatient Hospital Stay (HOSPITAL_COMMUNITY)
Admission: EM | Admit: 2021-06-12 | Discharge: 2021-06-18 | DRG: 074 | Disposition: A | Discharge status: Home / Self Care | Payer: Medicare (Managed Care) | Attending: Internal Medicine | Admitting: Internal Medicine

## 2021-06-12 ENCOUNTER — Other Ambulatory Visit: Payer: Self-pay

## 2021-06-12 DIAGNOSIS — F419 Anxiety disorder, unspecified: Secondary | ICD-10-CM | POA: Diagnosis present

## 2021-06-12 DIAGNOSIS — D649 Anemia, unspecified: Secondary | ICD-10-CM | POA: Diagnosis present

## 2021-06-12 DIAGNOSIS — N3000 Acute cystitis without hematuria: Secondary | ICD-10-CM

## 2021-06-12 DIAGNOSIS — F32A Depression, unspecified: Secondary | ICD-10-CM | POA: Diagnosis present

## 2021-06-12 DIAGNOSIS — N2889 Other specified disorders of kidney and ureter: Secondary | ICD-10-CM | POA: Diagnosis present

## 2021-06-12 DIAGNOSIS — F0390 Unspecified dementia without behavioral disturbance: Secondary | ICD-10-CM | POA: Diagnosis present

## 2021-06-12 DIAGNOSIS — W19XXXA Unspecified fall, initial encounter: Secondary | ICD-10-CM

## 2021-06-12 DIAGNOSIS — E876 Hypokalemia: Secondary | ICD-10-CM | POA: Diagnosis present

## 2021-06-12 DIAGNOSIS — Z794 Long term (current) use of insulin: Secondary | ICD-10-CM

## 2021-06-12 DIAGNOSIS — Z79899 Other long term (current) drug therapy: Secondary | ICD-10-CM

## 2021-06-12 DIAGNOSIS — I69398 Other sequelae of cerebral infarction: Secondary | ICD-10-CM

## 2021-06-12 DIAGNOSIS — E872 Acidosis: Secondary | ICD-10-CM | POA: Diagnosis not present

## 2021-06-12 DIAGNOSIS — I5032 Chronic diastolic (congestive) heart failure: Secondary | ICD-10-CM | POA: Diagnosis present

## 2021-06-12 DIAGNOSIS — I951 Orthostatic hypotension: Secondary | ICD-10-CM | POA: Diagnosis present

## 2021-06-12 DIAGNOSIS — N3281 Overactive bladder: Secondary | ICD-10-CM | POA: Diagnosis present

## 2021-06-12 DIAGNOSIS — E1122 Type 2 diabetes mellitus with diabetic chronic kidney disease: Secondary | ICD-10-CM | POA: Diagnosis present

## 2021-06-12 DIAGNOSIS — N179 Acute kidney failure, unspecified: Secondary | ICD-10-CM

## 2021-06-12 DIAGNOSIS — Z87891 Personal history of nicotine dependence: Secondary | ICD-10-CM

## 2021-06-12 DIAGNOSIS — R292 Abnormal reflex: Secondary | ICD-10-CM | POA: Diagnosis present

## 2021-06-12 DIAGNOSIS — E861 Hypovolemia: Secondary | ICD-10-CM | POA: Diagnosis present

## 2021-06-12 DIAGNOSIS — F05 Delirium due to known physiological condition: Secondary | ICD-10-CM | POA: Diagnosis not present

## 2021-06-12 DIAGNOSIS — Z20822 Contact with and (suspected) exposure to covid-19: Secondary | ICD-10-CM | POA: Diagnosis present

## 2021-06-12 DIAGNOSIS — R52 Pain, unspecified: Secondary | ICD-10-CM

## 2021-06-12 DIAGNOSIS — N3941 Urge incontinence: Secondary | ICD-10-CM | POA: Diagnosis present

## 2021-06-12 DIAGNOSIS — R55 Syncope and collapse: Secondary | ICD-10-CM | POA: Diagnosis present

## 2021-06-12 DIAGNOSIS — Z888 Allergy status to other drugs, medicaments and biological substances status: Secondary | ICD-10-CM

## 2021-06-12 DIAGNOSIS — Z9181 History of falling: Secondary | ICD-10-CM

## 2021-06-12 DIAGNOSIS — E1143 Type 2 diabetes mellitus with diabetic autonomic (poly)neuropathy: Secondary | ICD-10-CM | POA: Diagnosis not present

## 2021-06-12 DIAGNOSIS — Y9301 Activity, walking, marching and hiking: Secondary | ICD-10-CM | POA: Diagnosis present

## 2021-06-12 DIAGNOSIS — Z9049 Acquired absence of other specified parts of digestive tract: Secondary | ICD-10-CM

## 2021-06-12 DIAGNOSIS — N183 Chronic kidney disease, stage 3 unspecified: Secondary | ICD-10-CM

## 2021-06-12 DIAGNOSIS — Z91041 Radiographic dye allergy status: Secondary | ICD-10-CM

## 2021-06-12 DIAGNOSIS — G9349 Other encephalopathy: Secondary | ICD-10-CM | POA: Diagnosis present

## 2021-06-12 DIAGNOSIS — Z950 Presence of cardiac pacemaker: Secondary | ICD-10-CM

## 2021-06-12 DIAGNOSIS — Z885 Allergy status to narcotic agent status: Secondary | ICD-10-CM

## 2021-06-12 DIAGNOSIS — W1839XA Other fall on same level, initial encounter: Secondary | ICD-10-CM | POA: Diagnosis present

## 2021-06-12 DIAGNOSIS — E1165 Type 2 diabetes mellitus with hyperglycemia: Secondary | ICD-10-CM | POA: Diagnosis present

## 2021-06-12 DIAGNOSIS — R569 Unspecified convulsions: Secondary | ICD-10-CM | POA: Diagnosis not present

## 2021-06-12 DIAGNOSIS — N1832 Chronic kidney disease, stage 3b: Secondary | ICD-10-CM | POA: Diagnosis present

## 2021-06-12 DIAGNOSIS — Z87442 Personal history of urinary calculi: Secondary | ICD-10-CM

## 2021-06-12 DIAGNOSIS — Y92531 Health care provider office as the place of occurrence of the external cause: Secondary | ICD-10-CM

## 2021-06-12 DIAGNOSIS — E210 Primary hyperparathyroidism: Secondary | ICD-10-CM | POA: Diagnosis present

## 2021-06-12 DIAGNOSIS — I2510 Atherosclerotic heart disease of native coronary artery without angina pectoris: Secondary | ICD-10-CM | POA: Diagnosis present

## 2021-06-12 DIAGNOSIS — G259 Extrapyramidal and movement disorder, unspecified: Secondary | ICD-10-CM | POA: Diagnosis not present

## 2021-06-12 DIAGNOSIS — E860 Dehydration: Secondary | ICD-10-CM | POA: Diagnosis present

## 2021-06-12 DIAGNOSIS — R112 Nausea with vomiting, unspecified: Secondary | ICD-10-CM

## 2021-06-12 DIAGNOSIS — R200 Anesthesia of skin: Secondary | ICD-10-CM | POA: Diagnosis present

## 2021-06-12 DIAGNOSIS — G2401 Drug induced subacute dyskinesia: Secondary | ICD-10-CM | POA: Diagnosis present

## 2021-06-12 DIAGNOSIS — Z8249 Family history of ischemic heart disease and other diseases of the circulatory system: Secondary | ICD-10-CM

## 2021-06-12 DIAGNOSIS — Z833 Family history of diabetes mellitus: Secondary | ICD-10-CM

## 2021-06-12 DIAGNOSIS — I130 Hypertensive heart and chronic kidney disease with heart failure and stage 1 through stage 4 chronic kidney disease, or unspecified chronic kidney disease: Secondary | ICD-10-CM | POA: Diagnosis present

## 2021-06-12 DIAGNOSIS — Z7982 Long term (current) use of aspirin: Secondary | ICD-10-CM

## 2021-06-12 DIAGNOSIS — K3184 Gastroparesis: Secondary | ICD-10-CM | POA: Diagnosis present

## 2021-06-12 DIAGNOSIS — E785 Hyperlipidemia, unspecified: Secondary | ICD-10-CM | POA: Diagnosis present

## 2021-06-12 DIAGNOSIS — I495 Sick sinus syndrome: Secondary | ICD-10-CM | POA: Diagnosis present

## 2021-06-12 DIAGNOSIS — N281 Cyst of kidney, acquired: Secondary | ICD-10-CM | POA: Diagnosis present

## 2021-06-12 DIAGNOSIS — M48061 Spinal stenosis, lumbar region without neurogenic claudication: Secondary | ICD-10-CM

## 2021-06-12 DIAGNOSIS — D631 Anemia in chronic kidney disease: Secondary | ICD-10-CM

## 2021-06-12 DIAGNOSIS — R296 Repeated falls: Secondary | ICD-10-CM

## 2021-06-12 DIAGNOSIS — K5900 Constipation, unspecified: Secondary | ICD-10-CM | POA: Diagnosis present

## 2021-06-12 LAB — COMPREHENSIVE METABOLIC PANEL
ALT: 12 U/L (ref 0–44)
AST: 13 U/L — ABNORMAL LOW (ref 15–41)
Albumin: 3.6 g/dL (ref 3.5–5.0)
Alkaline Phosphatase: 65 U/L (ref 38–126)
Anion gap: 6 (ref 5–15)
BUN: 34 mg/dL — ABNORMAL HIGH (ref 8–23)
CO2: 22 mmol/L (ref 22–32)
Calcium: 11.7 mg/dL — ABNORMAL HIGH (ref 8.9–10.3)
Chloride: 108 mmol/L (ref 98–111)
Creatinine, Ser: 2.51 mg/dL — ABNORMAL HIGH (ref 0.44–1.00)
GFR, Estimated: 20 mL/min — ABNORMAL LOW (ref >60)
Glucose, Bld: 101 mg/dL — ABNORMAL HIGH (ref 70–99)
Potassium: 3.9 mmol/L (ref 3.5–5.1)
Sodium: 136 mmol/L (ref 135–145)
Total Bilirubin: 0.7 mg/dL (ref 0.3–1.2)
Total Protein: 6.8 g/dL (ref 6.5–8.1)

## 2021-06-12 LAB — CBC WITH DIFFERENTIAL/PLATELET
Abs Immature Granulocytes: 0.03 10*3/uL (ref 0.00–0.07)
Basophils Absolute: 0.1 10*3/uL (ref 0.0–0.1)
Basophils Relative: 1 %
Eosinophils Absolute: 0.2 10*3/uL (ref 0.0–0.5)
Eosinophils Relative: 2 %
HCT: 40.2 % (ref 36.0–46.0)
Hemoglobin: 13.4 g/dL (ref 12.0–15.0)
Immature Granulocytes: 0 %
Lymphocytes Relative: 33 %
Lymphs Abs: 4.6 10*3/uL — ABNORMAL HIGH (ref 0.7–4.0)
MCH: 31.3 pg (ref 26.0–34.0)
MCHC: 33.3 g/dL (ref 30.0–36.0)
MCV: 93.9 fL (ref 80.0–100.0)
Monocytes Absolute: 1.2 10*3/uL — ABNORMAL HIGH (ref 0.1–1.0)
Monocytes Relative: 8 %
Neutro Abs: 8 10*3/uL — ABNORMAL HIGH (ref 1.7–7.7)
Neutrophils Relative %: 56 %
Platelets: 220 10*3/uL (ref 150–400)
RBC: 4.28 MIL/uL (ref 3.87–5.11)
RDW: 12.7 % (ref 11.5–15.5)
WBC: 14.1 10*3/uL — ABNORMAL HIGH (ref 4.0–10.5)
nRBC: 0 % (ref 0.0–0.2)

## 2021-06-12 NOTE — ED Provider Notes (Addendum)
Emergency Medicine Provider Triage Evaluation Note  Melissa Lopez , a 70 y.o. female  was evaluated in triage.  Pt complains of back pain due to slipped disc.  History of dementia at baseline, person who dropped her off had multiple falls.  Reports no red flag such as fever, bowel or bladder complaints.  Review of Systems  Positive: Back pain Negative: Fever, bowel or bladder complaints  Physical Exam  BP 110/81 (BP Location: Left Arm)   Pulse (!) 54   Temp 97.6 F (36.4 C) (Oral)   Resp 15   SpO2 100%  Gen:   Awake, no distress   Resp:  Normal effort  MSK:   Moves extremities without difficulty  Other:    Medical Decision Making  Medically screening exam initiated at 3:36 PM.  Appropriate orders placed.  Jamaira Sherk was informed that the remainder of the evaluation will be completed by another provider, this initial triage assessment does not replace that evaluation, and the importance of remaining in the ED until their evaluation is complete.  5:44 PM PCP on the line, received 500 ml bolus in office previously diagnosed with orthostatic hypotension. Labs were obtained as well which were [facility]. Over the weekend patient has had 9 falls, family is describing, confusion after the fall. Some suspicion for seizure activity??? Although patient has been by neurology in the past.   [place]    Janeece Fitting, PA-C 06/12/21 1538    [address] [place], PA-C 06/12/21 1746    Lucrezia Starch, MD 06/13/21 [phone]

## 2021-06-12 NOTE — ED Triage Notes (Signed)
Pt with hx of dementia sts that she is here for eval of lower back pain related to a slipped disc. Person who dropped her off reports multiple falls recently as well.

## 2021-06-12 NOTE — ED Notes (Signed)
When there are updates call Rosita Kea (Daughter) at the number listed

## 2021-06-12 NOTE — Progress Notes (Deleted)
Electrophysiology Office Note Date: 06/12/2021  ID:  Melissa Lopez, DOB Dec 18, 1950, MRN [id number]  PCP[facility] Primary Cardiologist: Carlyle Dolly, MD Electrophysiologist: Thompson Grayer, MD   CC: Pacemaker follow-up  Melissa Lopez is a 70 y.o. female seen today for Thompson Grayer, MD for routine electrophysiology followup.  Since last being seen in our clinic the patient reports doing ***.   Seen in ED 06/12/21 due to back pain. Reported at least 9 falls over the weekend. Family describes confusion after the falls. Imaging negative for acute traumating injury.    she denies chest pain, palpitations, dyspnea, PND, orthopnea, nausea, vomiting, dizziness, syncope, edema, weight gain, or early satiety.  Device History: [employer] Surveyor, minerals PPM implanted 2011, gen change 2019 for SSS  Past Medical History:  Diagnosis Date   CKD (chronic kidney disease) stage 3, GFR 30-59 ml/min ([place]) 10/31/2018   - per patient report   Coronary artery disease    per patient report - 2 caths with non-obstructive CAD   Essential hypertension    Frequent falls    H/O cardiac pacemaker 10/31/2018   implanted 05/2010   Sick sinus syndrome ([place])    Type 2 diabetes mellitus with complication, with long-term current use of insulin ([place]) 10/31/2018   with diabetic nephropathy   Past Surgical History:  Procedure Laterality Date   BIOPSY  09/28/2020   Procedure: BIOPSY;  Surgeon: Ronald Lobo, MD;  [facility];  Service: Endoscopy;;   CARDIAC PACEMAKER PLACEMENT  2008   in [place] for sick sinus syndrome   CORONARY/GRAFT ACUTE MI REVASCULARIZATION N/A 11/09/2020   Procedure: CORONARY/GRAFT ACUTE MI REVASCULARIZATION;  Surgeon: Leonie Man, MD;  Location: [facility];  Service: Cardiovascular;  Laterality: N/A;   ESOPHAGOGASTRODUODENOSCOPY N/A 09/28/2020   Procedure: ESOPHAGOGASTRODUODENOSCOPY (EGD);  Surgeon: Ronald Lobo,  MD;  Location: [facility];  Service: Endoscopy;  Laterality: N/A;   ESOPHAGOGASTRODUODENOSCOPY (EGD) WITH PROPOFOL N/A 11/10/2018   Procedure: ESOPHAGOGASTRODUODENOSCOPY (EGD) WITH PROPOFOL;  Surgeon: Wilford Corner, MD;  Location: [facility];  Service: Endoscopy;  Laterality: N/A;   LEFT HEART CATH AND CORONARY ANGIOGRAPHY N/A 10/31/2018   Procedure: LEFT HEART CATH AND CORONARY ANGIOGRAPHY;  Surgeon: Leonie Man, MD;  Location: [facility];  Service: Cardiovascular;  Laterality: N/A;   PACEMAKER GENERATOR CHANGE  09/2018   in [place]    Current Outpatient Medications  Medication Sig Dispense Refill   acetaminophen (TYLENOL) 650 MG CR tablet Take 1,300 mg by mouth every 8 (eight) hours as needed for pain.     amLODipine (NORVASC) 10 MG tablet TAKE 1 TABLET BY MOUTH EVERY DAY (Patient not taking: Reported on 04/16/2021) 90 tablet 3   aspirin 81 MG chewable tablet Chew 1 tablet (81 mg total) by mouth daily. 30 tablet 0   buPROPion (WELLBUTRIN SR) 150 MG 12 hr tablet Take 150 mg by mouth daily.     carvedilol (COREG) 6.25 MG tablet Take 6.25 mg by mouth 2 (two) times daily with a meal.     escitalopram (LEXAPRO) 20 MG tablet Take 20 mg by mouth daily.     gabapentin (NEURONTIN) 100 MG capsule Take 100 mg by mouth 3 (three) times daily.     hydrALAZINE (APRESOLINE) 25 MG tablet Take 25 mg by mouth at bedtime.     insulin degludec (TRESIBA FLEXTOUCH) 100 UNIT/ML FlexTouch Pen Inject 5 Units into the skin daily.     lisinopril (ZESTRIL)  20 MG tablet Take 1 tablet (20 mg total) by mouth daily. 90 tablet 3   LORazepam (ATIVAN) 1 MG tablet Take 1 mg by mouth daily as needed for anxiety. (Patient not taking: Reported on 04/16/2021)     metoCLOPramide (REGLAN) 5 MG tablet Take 1 tablet (5 mg total) by mouth 3 (three) times daily for 14 days. 42 tablet 0   Multiple Vitamin (MULTIVITAMIN WITH MINERALS) TABS tablet Take 1 tablet by mouth daily.     Nutritional Supplements (,FEEDING  SUPPLEMENT, PROSOURCE PLUS) liquid Take 30 mLs by mouth daily. 887 mL 0   omeprazole (PRILOSEC) 20 MG capsule Take 20 mg by mouth daily.     ondansetron (ZOFRAN) 4 MG tablet Take 1 tablet (4 mg total) by mouth every 6 (six) hours. 12 tablet 0   oxybutynin (DITROPAN-XL) 10 MG 24 hr tablet Take 10 mg by mouth daily.      polyethylene glycol (MIRALAX / GLYCOLAX) 17 g packet Take 17 g by mouth 2 (two) times daily. 14 each 0   rosuvastatin (CRESTOR) 5 MG tablet Take one tablet daily 90 tablet 3   traZODone (DESYREL) 100 MG tablet Take 100 mg by mouth at bedtime.     No current facility-administered medications for this visit.    Allergies:   Contrast media [iodinated diagnostic agents], Oxycodone-acetaminophen, Xarelto  [rivaroxaban], and Oxycodone   Social History: Social History   Socioeconomic History   Marital status: Single    Spouse name: Not on file   Number of children: 4   Years of education: Not on file   Highest education level: Some college, no degree  Occupational History    Comment: retired  Tobacco Use   Smoking status: Former    Years: 3.00    Pack years: 0.00    Types: Cigarettes    Quit date: 12/09/1969    Years since quitting: 51.5   Smokeless tobacco: Never  Substance and Sexual Activity   Alcohol use: Not Currently   Drug use: Not Currently    Comment: as teenager   Sexual activity: Not on file  Other Topics Concern   Not on file  Social History Narrative   04/16/21 Moved from [place] and lives with daughter, [name] in [place].   Now in [employer]   Social Determinants of Health   Financial Resource Strain: Not on file  Food Insecurity: Not on file  Transportation Needs: Not on file  Physical Activity: Not on file  Stress: Not on file  Social Connections: Not on file  Intimate Partner Violence: Not on file    Family History: Family History  Problem Relation Age of Onset   Breast cancer Mother    Bone cancer Mother    Heart attack Father 56   Breast cancer  Sister    Heart disease Brother        stents   Other Sister        MVA   Diabetes Brother      Review of Systems: All other systems reviewed and are otherwise negative except as noted above.  Physical Exam: There were no vitals filed for this visit.   GEN- The patient is well appearing, alert and oriented x 3 today.   HEENT: normocephalic, atraumatic; sclera clear, conjunctiva pink; hearing intact; oropharynx clear; neck supple  Lungs- Clear to ausculation bilaterally, normal work of breathing.  No wheezes, rales, rhonchi Heart- Regular rate and rhythm, no murmurs, rubs or gallops  GI- soft, non-tender, non-distended, bowel sounds  present  Extremities- no clubbing or cyanosis. No edema MS- no significant deformity or atrophy Skin- warm and dry, no rash or lesion; PPM pocket well healed Psych- euthymic mood, full affect Neuro- strength and sensation are intact  PPM Interrogation- reviewed in detail today,  See PACEART report  EKG:  EKG is not ordered today. The ekg ordered 04/13/21 shows ***  Recent Labs: 09/22/2020: TSH 2.207 11/17/2020: Magnesium 2.1 06/12/2021: ALT 12; BUN 34; Creatinine, Ser 2.51; Hemoglobin 13.4; Platelets 220; Potassium 3.9; Sodium 136   Wt Readings from Last 3 Encounters:  04/16/21 150 lb (68 kg)  11/28/20 145 lb (65.8 kg)  11/09/20 151 lb 7.3 oz (68.7 kg)     Other studies Reviewed: Additional studies/ records that were reviewed today include: Previous EP office notes, Previous remote checks, Most recent labwork.   Assessment and Plan:  1. Sick sinus syndrome s/p St. Jude PPM  Normal PPM function See Pace Art report No changes today  2. HTN Stable  Current medicines are reviewed at length with the patient today.   The patient {ACTIONS; HAS/DOES NOT HAVE:19233} concerns regarding her medicines.  The following changes were made today:  {NONE DEFAULTED:18576}  Labs/ tests ordered today include: *** No orders of the defined types were placed  in this encounter.    Disposition:   Follow up with {Blank single:19197::"Dr. Allred","Dr. Arlan Organ. Klein","Dr. Camnitz","Dr. Lambert","EP APP"} in *** {Blank single:19197::"Months","Weeks"}    Signed, Shirley Friar, PA-C  06/12/2021 9:15 PM  [place] [address] [phone] (office) [phone] (fax)

## 2021-06-13 ENCOUNTER — Encounter (HOSPITAL_COMMUNITY): Payer: Self-pay | Admitting: Internal Medicine

## 2021-06-13 ENCOUNTER — Encounter: Payer: Medicare (Managed Care) | Admitting: Student

## 2021-06-13 ENCOUNTER — Observation Stay (HOSPITAL_COMMUNITY): Payer: Medicare (Managed Care)

## 2021-06-13 ENCOUNTER — Emergency Department (HOSPITAL_COMMUNITY): Payer: Medicare (Managed Care)

## 2021-06-13 DIAGNOSIS — I361 Nonrheumatic tricuspid (valve) insufficiency: Secondary | ICD-10-CM | POA: Diagnosis not present

## 2021-06-13 DIAGNOSIS — R55 Syncope and collapse: Secondary | ICD-10-CM | POA: Diagnosis not present

## 2021-06-13 DIAGNOSIS — N179 Acute kidney failure, unspecified: Secondary | ICD-10-CM | POA: Diagnosis not present

## 2021-06-13 DIAGNOSIS — I951 Orthostatic hypotension: Secondary | ICD-10-CM | POA: Diagnosis not present

## 2021-06-13 DIAGNOSIS — R296 Repeated falls: Secondary | ICD-10-CM | POA: Diagnosis not present

## 2021-06-13 DIAGNOSIS — R569 Unspecified convulsions: Secondary | ICD-10-CM

## 2021-06-13 LAB — COMPREHENSIVE METABOLIC PANEL
ALT: 11 U/L (ref 0–44)
AST: 16 U/L (ref 15–41)
Albumin: 3.9 g/dL (ref 3.5–5.0)
Alkaline Phosphatase: 78 U/L (ref 38–126)
Anion gap: 9 (ref 5–15)
BUN: 21 mg/dL (ref 8–23)
CO2: 21 mmol/L — ABNORMAL LOW (ref 22–32)
Calcium: 12.7 mg/dL — ABNORMAL HIGH (ref 8.9–10.3)
Chloride: 110 mmol/L (ref 98–111)
Creatinine, Ser: 1.86 mg/dL — ABNORMAL HIGH (ref 0.44–1.00)
GFR, Estimated: 29 mL/min — ABNORMAL LOW (ref >60)
Glucose, Bld: 106 mg/dL — ABNORMAL HIGH (ref 70–99)
Potassium: 4.1 mmol/L (ref 3.5–5.1)
Sodium: 140 mmol/L (ref 135–145)
Total Bilirubin: 0.8 mg/dL (ref 0.3–1.2)
Total Protein: 7.4 g/dL (ref 6.5–8.1)

## 2021-06-13 LAB — CBC WITH DIFFERENTIAL/PLATELET
Abs Immature Granulocytes: 0.02 10*3/uL (ref 0.00–0.07)
Basophils Absolute: 0.1 10*3/uL (ref 0.0–0.1)
Basophils Relative: 1 %
Eosinophils Absolute: 0.1 10*3/uL (ref 0.0–0.5)
Eosinophils Relative: 1 %
HCT: 44 % (ref 36.0–46.0)
Hemoglobin: 14.7 g/dL (ref 12.0–15.0)
Immature Granulocytes: 0 %
Lymphocytes Relative: 25 %
Lymphs Abs: 3 10*3/uL (ref 0.7–4.0)
MCH: 31.1 pg (ref 26.0–34.0)
MCHC: 33.4 g/dL (ref 30.0–36.0)
MCV: 93 fL (ref 80.0–100.0)
Monocytes Absolute: 0.9 10*3/uL (ref 0.1–1.0)
Monocytes Relative: 7 %
Neutro Abs: 8.1 10*3/uL — ABNORMAL HIGH (ref 1.7–7.7)
Neutrophils Relative %: 66 %
Platelets: 230 10*3/uL (ref 150–400)
RBC: 4.73 MIL/uL (ref 3.87–5.11)
RDW: 12.3 % (ref 11.5–15.5)
WBC: 12.2 10*3/uL — ABNORMAL HIGH (ref 4.0–10.5)
nRBC: 0 % (ref 0.0–0.2)

## 2021-06-13 LAB — URINALYSIS, ROUTINE W REFLEX MICROSCOPIC
Bilirubin Urine: NEGATIVE
Glucose, UA: 50 mg/dL — AB
Hgb urine dipstick: NEGATIVE
Ketones, ur: NEGATIVE mg/dL
Nitrite: NEGATIVE
Protein, ur: NEGATIVE mg/dL
Specific Gravity, Urine: 1.01 (ref 1.005–1.030)
pH: 6 (ref 5.0–8.0)

## 2021-06-13 LAB — CREATININE, URINE, RANDOM: Creatinine, Urine: 22.84 mg/dL

## 2021-06-13 LAB — ECHOCARDIOGRAM COMPLETE
Height: 65 in
S' Lateral: 3.3 cm
Weight: 2240 oz

## 2021-06-13 LAB — GLUCOSE, CAPILLARY: Glucose-Capillary: 122 mg/dL — ABNORMAL HIGH (ref 70–99)

## 2021-06-13 LAB — SODIUM, URINE, RANDOM: Sodium, Ur: 134 mmol/L

## 2021-06-13 LAB — RESP PANEL BY RT-PCR (FLU A&B, COVID) ARPGX2
Influenza A by PCR: NEGATIVE
Influenza B by PCR: NEGATIVE
SARS Coronavirus 2 by RT PCR: NEGATIVE

## 2021-06-13 LAB — MAGNESIUM: Magnesium: 2.3 mg/dL (ref 1.7–2.4)

## 2021-06-13 LAB — CK: Total CK: 40 U/L (ref 38–234)

## 2021-06-13 LAB — CBG MONITORING, ED: Glucose-Capillary: 85 mg/dL (ref 70–99)

## 2021-06-13 LAB — PHOSPHORUS: Phosphorus: 2.2 mg/dL — ABNORMAL LOW (ref 2.5–4.6)

## 2021-06-13 LAB — TSH: TSH: 1.622 u[IU]/mL (ref 0.350–4.500)

## 2021-06-13 LAB — CORTISOL-AM, BLOOD: Cortisol - AM: 13.2 ug/dL (ref 6.7–22.6)

## 2021-06-13 MED ORDER — SODIUM CHLORIDE 0.9 % IV SOLN: Freq: Once | INTRAVENOUS | Status: AC

## 2021-06-13 MED ORDER — LACTATED RINGERS IV SOLN: INTRAVENOUS | Status: DC

## 2021-06-13 MED ORDER — CARVEDILOL 6.25 MG PO TABS
6.2500 mg | ORAL_TABLET | Freq: Two times a day (BID) | ORAL | Status: DC
  Administered 2021-06-13 – 2021-06-18 (×9): 6.25 mg via ORAL
  Filled 2021-06-13 (×7): qty 1
  Filled 2021-06-13: qty 2
  Filled 2021-06-13 (×2): qty 1

## 2021-06-13 MED ORDER — SODIUM CHLORIDE 0.9 % IV BOLUS: 1000.0000 mL | Freq: Once | INTRAVENOUS | Status: DC

## 2021-06-13 MED ORDER — INSULIN ASPART 100 UNIT/ML IJ SOLN
0.0000 [IU] | Freq: Three times a day (TID) | INTRAMUSCULAR | Status: DC
  Administered 2021-06-14: 1 [IU] via SUBCUTANEOUS
  Administered 2021-06-14: 5 [IU] via SUBCUTANEOUS
  Administered 2021-06-14: 3 [IU] via SUBCUTANEOUS
  Administered 2021-06-15 (×3): 1 [IU] via SUBCUTANEOUS
  Administered 2021-06-16 – 2021-06-17 (×5): 2 [IU] via SUBCUTANEOUS
  Administered 2021-06-18: 1 [IU] via SUBCUTANEOUS
  Administered 2021-06-18: 7 [IU] via SUBCUTANEOUS

## 2021-06-13 MED ORDER — ONDANSETRON HCL 4 MG PO TABS
4.0000 mg | ORAL_TABLET | Freq: Once | ORAL | Status: AC
  Administered 2021-06-13: 4 mg via ORAL
  Filled 2021-06-13: qty 1

## 2021-06-13 MED ORDER — ONDANSETRON 4 MG PO TBDP
4.0000 mg | ORAL_TABLET | Freq: Once | ORAL | Status: AC
  Administered 2021-06-13: 4 mg via ORAL
  Filled 2021-06-13: qty 1

## 2021-06-13 MED ORDER — ROSUVASTATIN CALCIUM 5 MG PO TABS
5.0000 mg | ORAL_TABLET | Freq: Every day | ORAL | Status: DC
  Administered 2021-06-14 – 2021-06-18 (×5): 5 mg via ORAL
  Filled 2021-06-13 (×5): qty 1

## 2021-06-13 MED ORDER — HYDROMORPHONE HCL 2 MG PO TABS
1.0000 mg | ORAL_TABLET | ORAL | Status: DC | PRN
  Administered 2021-06-13 – 2021-06-14 (×3): 1 mg via ORAL
  Filled 2021-06-13 (×3): qty 1

## 2021-06-13 MED ORDER — ENOXAPARIN SODIUM 30 MG/0.3ML IJ SOSY
30.0000 mg | PREFILLED_SYRINGE | INTRAMUSCULAR | Status: DC
  Administered 2021-06-13 – 2021-06-15 (×3): 30 mg via SUBCUTANEOUS
  Filled 2021-06-13 (×3): qty 0.3

## 2021-06-13 MED ORDER — ASPIRIN 81 MG PO CHEW
81.0000 mg | CHEWABLE_TABLET | Freq: Every day | ORAL | Status: DC
  Administered 2021-06-14 – 2021-06-18 (×5): 81 mg via ORAL
  Filled 2021-06-13 (×5): qty 1

## 2021-06-13 MED ORDER — SODIUM CHLORIDE 0.9 % IV SOLN
1.0000 g | Freq: Once | INTRAVENOUS | Status: AC
  Administered 2021-06-13: 1 g via INTRAVENOUS
  Filled 2021-06-13: qty 10

## 2021-06-13 MED ORDER — OXYBUTYNIN CHLORIDE ER 10 MG PO TB24
10.0000 mg | ORAL_TABLET | Freq: Every day | ORAL | Status: DC
  Administered 2021-06-14 – 2021-06-18 (×5): 10 mg via ORAL
  Filled 2021-06-13 (×5): qty 1

## 2021-06-13 MED ORDER — ESCITALOPRAM OXALATE 10 MG PO TABS
20.0000 mg | ORAL_TABLET | Freq: Every day | ORAL | Status: DC
  Administered 2021-06-15 – 2021-06-18 (×4): 20 mg via ORAL
  Filled 2021-06-13 (×4): qty 2

## 2021-06-13 MED ORDER — FENTANYL CITRATE (PF) 100 MCG/2ML IJ SOLN
50.0000 ug | Freq: Once | INTRAMUSCULAR | Status: AC
Start: 2021-06-13 — End: 2021-06-13
  Administered 2021-06-13: 50 ug via INTRAVENOUS
  Filled 2021-06-13: qty 2

## 2021-06-13 NOTE — Hospital Course (Addendum)
C/o hip pain andleg overnight -left.morepainfulthan her bellypain Some dizzinessforasecondwhen standing -abdominal bvinder is helping  To do -Order for pain -vicodin?  Denosumab -sendhomewith

## 2021-06-13 NOTE — Progress Notes (Addendum)
Patient arrived to 4NP07 alert to self, place and time. Pt voice that her left hip hurt  for the last few weeks n take vicodin at home and unsure why [facility] sent her here. Pt had one ep of emesis, no prn antienemic on MAR. Patient also w/ elevated b/p. Please see doc flow sheet. No siezure activity noted at this time. MD paged and will come to assess patient. Repositioned pt to right side to eleviate pain per her request.   POC: Frequent rounds, Monitor sz activity, LOC,  pain nausea and VS.

## 2021-06-13 NOTE — Progress Notes (Signed)
Patient is unable to have MRI scan due to the device leads being NOT compatible for MRI.  MRI staff notified.

## 2021-06-13 NOTE — ED Provider Notes (Signed)
[facility] EMERGENCY DEPARTMENT Provider Note   CSN: [id number] Arrival date & time: 06/12/21  1456     History Chief Complaint  Patient presents with   Back Pain    Melissa Lopez is a 70 y.o. female with a history of CKD stage III, CAD, HTN, sick sinus syndrome s/p pacemaker, chronic diastolic CHF, primary hyperparathyroidism who presents to the emergency department from her PCP's office with a chief complaint of fall.  Most of the history is provided by the patient's daughter, [name].  For the last 8 months, the patient has been having recurrent episodes while walking.  She states that all of a sudden while the patient is ambulating that all of her muscles will tighten and starts shaking in all 4 extremities.  Her eyes will roll back into her head and she will fall.  [name] reports that her body shakes so violently that family can even hold onto her due to the intensity of the shaking.  She reports that all of these episodes happen while ambulating and she has been sporadically having 1 episode every few months.  She states that the episodes will typically only last a few seconds to no more than 30 seconds.  The patient is typically able to tell her that she will have an episode and will be able to say I am falling and knows that an episode is about to occur.  She does not lose consciousness.  She has never had any urinary or fecal incontinence with these episodes.  She does report that sometimes she will have some mild confusion where her memory will be coming and going for about an hour to where she may briefly stutter and seems confused, but the symptoms will always resolve.  However, over the last 3 days, the patient has had 10 episodes of similar symptoms.  However, only 6 of these have been while standing or ambulating.  She had 2 while she was lying flat and 1 while sitting.  She then had another episode while she was seen at her PCPs offices today for evaluation.  She  does note that the patient was told that she had orthostatic hypotension when she was evaluated in the office today and was treated with 500 cc of IV fluids.  After the PCPs appointment, the patient was using her walker to ambulate to the pace bus when she had another episode of shaking and fell.  [name] reports that the patient has injured herself multiple times since these episodes have worsened.  Yesterday, she fell and lip hit her left eye when she stood up from the toilet.  Last night, she fell and hit the back of her head.  However, [name] was more concerned because yesterday when she was ambulating to the bathroom with her walker.  [name] was walking behind the patient.  When she was about 10 feet from the bathroom door she had a shaking episode (typically when she has these episodes her back will arch and she will fall backward), she fell forward onto a tray.  [name] states that her whole body was held up against the tray and was shaking.  She was eventually able to be lowered to the ground by [name].  However, when she got back on her feet she seemed lucid and okay, but when the patient came out of the bathroom she cannot remember any of the details regarding the fall that had just occurred, which was new.  Over the last 2 weeks, she  reports that she has fallen and hit in her head hard at least 2 times.  [name] does report that the patient has some baseline "forgetfulness", but has had increasing confusion over the last few days.  One specific example is earlier in the day the patient called Lori's sister, Abigail Butts, and thought that she was calling [name].  She kept calling Assunta Curtis despite being corrected by Abigail Butts.  [name] reports that she has had good p.o. intake.  She is diligent about making sure that she is drinking fluids and multiple ensures throughout the day.  She denies any recent medication changes.  No fever, chills, shortness of breath.  About a month ago, the patient did begin having some pain in her  left hip and low back when they went to [facility].  She did not have a fall or an injury at that time.  She reports that she was putting all of her weight on the opposite leg and then began to have pain in the left hip.  Pain has persisted since that time.  The patient ambulates with a walker at baseline, but they were given a wheelchair when she was seen at her PCPs office yesterday.  She was initially evaluated by neurology in May 2022.  She has not had any MRI imaging.  The patient has a pacemaker in place.  She is followed by cardiology.  On my evaluation, the patient is endorsing low back pain.  She also reports that she has been voiding more frequently than baseline.  She has no other complaints at this time including fever, chills, cough, chest pain, shortness of breath, abdominal pain, headache, numbness, weakness, dizziness, lightheadedness.   The patient states that when she is about to start having an episode of shaking that she will feel it come on.  She will often call out to let family know that it is about to occur.  She reports that she will have the shaking all over that she cannot control and then will feel weakness in her legs and it will seem as if her knee is giving out.   The history is provided by the patient, medical records and a relative. No language interpreter was used.      Past Medical History:  Diagnosis Date   CKD (chronic kidney disease) stage 3, GFR 30-59 ml/min (HCC) 10/31/2018   - per patient report   Coronary artery disease    per patient report - 2 caths with non-obstructive CAD   Essential hypertension    Frequent falls    H/O cardiac pacemaker 10/31/2018   implanted 05/2010   Sick sinus syndrome ([place])    Type 2 diabetes mellitus with complication, with long-term current use of insulin ([place]) 10/31/2018   with diabetic nephropathy    Patient Active Problem List   Diagnosis Date Noted   Seizure-like activity ([place]) 06/13/2021   Loss of weight     Primary hypertension    Chest pain of uncertain etiology 63/89/3734   Hyperparathyroidism, primary ([place]) 09/25/2020   Emesis    Hypophosphatemia    Hypokalemia 09/22/2020   Chronic diastolic CHF (congestive heart failure) ([place]) 09/22/2020   Noninfective gastroenteritis and colitis, unspecified 04/13/2020   Unilateral primary osteoarthritis, right knee 04/13/2020   Arthritis 04/13/2020   Bradycardia 04/13/2020   Cardiomyopathy, 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[place]) 04/13/2020   Class 1 obesity due to excess calories with body mass index (BMI) of 34.0 to 34.9 in adult 04/13/2020   Acute kidney failure, unspecified ([place]) 04/29/2019  Intractable nausea and vomiting 11/10/2018   Malnutrition of moderate degree 11/09/2018   AKI (acute kidney injury) ([place])    PE (pulmonary thromboembolism) ([place])    H/O cardiac pacemaker 10/31/2018   Syncope 10/31/2018   ST elevation on EKG without MI 10/31/2018   Chest pain with high risk of acute coronary syndrome 10/31/2018   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[place]) 10/31/2018   Type 2 diabetes mellitus with complication, with long-term current use of insulin ([place]) 10/31/2018   CKD (chronic kidney disease) stage 3, GFR 30-59 ml/min (HCC) - per patient report 10/31/2018   Presence of cardiac pacemaker 10/31/2018   Essential hypertension    Hypercalcemia 07/27/2015    Past Surgical History:  Procedure Laterality Date   BIOPSY  09/28/2020   Procedure: BIOPSY;  Surgeon: Ronald Lobo, MD;  Location: [facility];  Service: Endoscopy;;   CARDIAC PACEMAKER PLACEMENT  2008   in [place] for sick sinus syndrome   CORONARY/GRAFT ACUTE MI REVASCULARIZATION N/A 11/09/2020   Procedure: CORONARY/GRAFT ACUTE MI REVASCULARIZATION;  Surgeon: Leonie Man, MD;  Location: [facility];  Service: Cardiovascular;  Laterality: N/A;   ESOPHAGOGASTRODUODENOSCOPY N/A 09/28/2020   Procedure: ESOPHAGOGASTRODUODENOSCOPY (EGD);  Surgeon: Ronald Lobo, MD;   Location: [facility];  Service: Endoscopy;  Laterality: N/A;   ESOPHAGOGASTRODUODENOSCOPY (EGD) WITH PROPOFOL N/A 11/10/2018   Procedure: ESOPHAGOGASTRODUODENOSCOPY (EGD) WITH PROPOFOL;  Surgeon: Wilford Corner, MD;  Location: [facility];  Service: Endoscopy;  Laterality: N/A;   LEFT HEART CATH AND CORONARY ANGIOGRAPHY N/A 10/31/2018   Procedure: LEFT HEART CATH AND CORONARY ANGIOGRAPHY;  Surgeon: Leonie Man, MD;  Location: [facility];  Service: Cardiovascular;  Laterality: N/A;   PACEMAKER GENERATOR CHANGE  09/2018   in [place]     OB History   No obstetric history on file.     Family History  Problem Relation Age of Onset   Breast cancer Mother    Bone cancer Mother    Heart attack Father 31   Breast cancer Sister    Heart disease Brother        stents   Other Sister        MVA   Diabetes Brother     Social History   Tobacco Use   Smoking status: Former    Years: 3.00    Pack years: 0.00    Types: Cigarettes    Quit date: 12/09/1969    Years since quitting: 51.5   Smokeless tobacco: Never  Substance Use Topics   Alcohol use: Not Currently   Drug use: Not Currently    Comment: as teenager    Home Medications Prior to Admission medications   Medication Sig Start Date End Date Taking? Authorizing Provider  acetaminophen (TYLENOL) 650 MG CR tablet Take 1,300 mg by mouth every 8 (eight) hours as needed for pain.    [provider]  amLODipine (NORVASC) 10 MG tablet TAKE 1 TABLET BY MOUTH EVERY DAY Patient not taking: Reported on 04/16/2021 10/10/20   Imogene Burn, PA-C  aspirin 81 MG chewable tablet Chew 1 tablet (81 mg total) by mouth daily. 11/13/18   Dessa Phi, DO  buPROPion (WELLBUTRIN SR) 150 MG 12 hr tablet Take 150 mg by mouth daily. 07/13/20   [provider]  carvedilol (COREG) 6.25 MG tablet Take 6.25 mg by mouth 2 (two) times daily with a meal.    [provider]  escitalopram (LEXAPRO) 20 MG tablet  Take 20 mg  by mouth daily. 08/11/20   [provider]  gabapentin (NEURONTIN) 100 MG capsule Take 100 mg by mouth 3 (three) times daily. 08/11/20   [provider]  hydrALAZINE (APRESOLINE) 25 MG tablet Take 25 mg by mouth at bedtime. 11/09/20   [provider]  insulin degludec (TRESIBA FLEXTOUCH) 100 UNIT/ML FlexTouch Pen Inject 5 Units into the skin daily.    [provider]  lisinopril (ZESTRIL) 20 MG tablet Take 1 tablet (20 mg total) by mouth daily. 10/10/20 01/08/21  Imogene Burn, PA-C  LORazepam (ATIVAN) 1 MG tablet Take 1 mg by mouth daily as needed for anxiety. Patient not taking: Reported on 04/16/2021 05/10/20   [provider]  metoCLOPramide (REGLAN) 5 MG tablet Take 1 tablet (5 mg total) by mouth 3 (three) times daily for 14 days. 11/17/20 12/01/20  Terrilee Croak, MD  Multiple Vitamin (MULTIVITAMIN WITH MINERALS) TABS tablet Take 1 tablet by mouth daily. 09/30/20   Hongalgi, Lenis Dickinson, MD  Nutritional Supplements (,FEEDING SUPPLEMENT, PROSOURCE PLUS) liquid Take 30 mLs by mouth daily. 09/29/20   Hongalgi, Lenis Dickinson, MD  omeprazole (PRILOSEC) 20 MG capsule Take 20 mg by mouth daily.    [provider]  ondansetron (ZOFRAN) 4 MG tablet Take 1 tablet (4 mg total) by mouth every 6 (six) hours. 10/28/20   Couture, Cortni S, PA-C  oxybutynin (DITROPAN-XL) 10 MG 24 hr tablet Take 10 mg by mouth daily.     [provider]  polyethylene glycol (MIRALAX / GLYCOLAX) 17 g packet Take 17 g by mouth 2 (two) times daily. 09/29/20   Modena Jansky, MD  rosuvastatin (CRESTOR) 5 MG tablet Take one tablet daily 10/10/20   Imogene Burn, PA-C  traZODone (DESYREL) 100 MG tablet Take 100 mg by mouth at bedtime.    [provider]    Allergies    Contrast media [iodinated diagnostic agents], Oxycodone-acetaminophen, Xarelto  [rivaroxaban], and Oxycodone  Review of Systems   Review of Systems  Constitutional:  Negative for activity  change, chills and fever.  HENT:  Negative for congestion, sore throat and tinnitus.   Eyes:  Negative for visual disturbance.  Respiratory:  Negative for cough, shortness of breath and wheezing.   Cardiovascular:  Negative for chest pain and palpitations.  Gastrointestinal:  Negative for abdominal pain, constipation, diarrhea, nausea and vomiting.  Genitourinary:  Positive for frequency. Negative for dysuria, genital sores, menstrual problem and urgency.  Musculoskeletal:  Positive for arthralgias and myalgias. Negative for back pain, gait problem, neck pain and neck stiffness.  Skin:  Positive for wound. Negative for rash.  Allergic/Immunologic: Negative for immunocompromised state.  Neurological:  Positive for tremors and weakness. Negative for dizziness, syncope, light-headedness, numbness and headaches.  Psychiatric/Behavioral:  Negative for confusion.    Physical Exam Updated Vital Signs BP (!) 152/95   Pulse 63   Temp 98 F (36.7 C) (Oral)   Resp 17   Ht 5\' 5"  (1.651 m)   Wt 63.5 kg   SpO2 98%   BMI 23.30 kg/m   Physical Exam Vitals and nursing note reviewed.  Constitutional:      General: She is not in acute distress. HENT:     Head: Normocephalic.     Comments: Ecchymosis noted to the left periorbital region.    Mouth/Throat:     Comments: Lips are dry and cracked. Eyes:     Extraocular Movements: Extraocular movements intact.     Conjunctiva/sclera: Conjunctivae normal.  Pupils: Pupils are equal, round, and reactive to light.  Cardiovascular:     Rate and Rhythm: Normal rate and regular rhythm.     Heart sounds: No murmur heard.   No friction rub. No gallop.     Comments: Peripheral pulses are intact. Pulmonary:     Effort: Pulmonary effort is normal. No respiratory distress.     Breath sounds: No stridor. No wheezing, rhonchi or rales.  Chest:     Chest wall: No tenderness.  Abdominal:     General: There is no distension.     Palpations: Abdomen is  soft.     Tenderness: There is abdominal tenderness.     Comments: Tender to palpation in the left lower quadrant and suprapubic regions.  No wounds noted to the abdominal wall.  Abdomen is soft and nondistended.  No tenderness over McBurney's point.  No CVA tenderness bilaterally.  Negative Murphy sign.  Musculoskeletal:     Cervical back: Neck supple.     Right lower leg: No edema.     Left lower leg: No edema.     Comments: Tender to palpation to the spinous processes of the lumbar spine.  No crepitus or step-offs.  No tenderness to the thoracic or cervical spinous processes.  Negative straight leg raise bilaterally.  Skin:    General: Skin is warm.     Capillary Refill: Capillary refill takes 2 to 3 seconds.     Findings: No rash.  Neurological:     Mental Status: She is alert.     Comments: Alert and oriented x3.  GCS 15.  5-5 strength against resistance of the bilateral upper and lower extremities.  Sensation is intact and equal to the bilateral upper and lower extremities.  Psychiatric:        Behavior: Behavior normal.    ED Results / Procedures / Treatments   Labs (all labs ordered are listed, but only abnormal results are displayed) Labs Reviewed  CBC WITH DIFFERENTIAL/PLATELET - Abnormal; Notable for the following components:      Result Value   WBC 14.1 (*)    Neutro Abs 8.0 (*)    Lymphs Abs 4.6 (*)    Monocytes Absolute 1.2 (*)    All other components within normal limits  COMPREHENSIVE METABOLIC PANEL - Abnormal; Notable for the following components:   Glucose, Bld 101 (*)    BUN 34 (*)    Creatinine, Ser 2.51 (*)    Calcium 11.7 (*)    AST 13 (*)    GFR, Estimated 20 (*)    All other components within normal limits  URINALYSIS, ROUTINE W REFLEX MICROSCOPIC - Abnormal; Notable for the following components:   APPearance HAZY (*)    Glucose, UA 50 (*)    Leukocytes,Ua LARGE (*)    Bacteria, UA RARE (*)    All other components within normal limits  RESP PANEL  BY RT-PCR (FLU A&B, COVID) ARPGX2  URINE CULTURE  CK  TSH  CORTISOL-AM, BLOOD    EKG None  Radiology CT ABDOMEN PELVIS WO CONTRAST  Result Date: 06/13/2021 CLINICAL DATA:  Trauma/fall, back pain EXAM: CT CHEST, ABDOMEN AND PELVIS WITHOUT CONTRAST TECHNIQUE: Multidetector CT imaging of the chest, abdomen and pelvis was performed following the standard protocol without IV contrast. COMPARISON:  CT abdomen/pelvis dated 11/09/2020. FINDINGS: CT CHEST FINDINGS Cardiovascular: The heart is normal in size. Trace pericardial fluid. No evidence of thoracic aortic aneurysm. Mild atherosclerotic calcifications of the aortic arch. Mild coronary atherosclerosis  of the LAD and left circumflex. Left subclavian pacemaker. Mediastinum/Nodes: No suspicious mediastinal lymphadenopathy. Visualized thyroid is unremarkable. Lungs/Pleura: Minimal dependent atelectasis in the bilateral lower lobes. Mild centrilobular emphysematous changes, upper lung predominant. No focal consolidation. No suspicious pulmonary nodules. No pleural effusion or pneumothorax. Musculoskeletal: Mild degenerative changes of the lower thoracic spine. No fracture is seen. Bilateral clavicles, scapulae, and ribs are intact. Sternum is intact. CT ABDOMEN PELVIS FINDINGS Hepatobiliary: Unenhanced liver is grossly unremarkable. Pneumobilia. Status post cholecystectomy. No intrahepatic ductal dilatation. Dilated common duct, measuring 17 mm, although smoothly tapering at the ampulla, likely postsurgical. Pancreas: Within normal limits. Spleen: Within normal limits. Adrenals/Urinary Tract: Adrenal glands are within normal limits. Multiple bilateral renal cysts of varying sizes and complexities, with peripheral rim calcifications and possible parenchymal calcifications, poorly evaluated on unenhanced CT but grossly unremarkable. Representative cyst measures 3.0 cm in the medial right lower kidney (series 4/image 31). No hydronephrosis. Bladder is within normal  limits. Stomach/Bowel: Stomach is within normal limits. No evidence of bowel obstruction. Appendix is not discretely visualized. No colonic wall thickening or inflammatory changes. Vascular/Lymphatic: No evidence of abdominal aortic aneurysm. Atherosclerotic calcifications of the abdominal aorta and branch vessels. No suspicious abdominopelvic lymphadenopathy. Reproductive: Status post hysterectomy. No adnexal masses. Other: No abdominopelvic ascites. No hemoperitoneum or free air. Musculoskeletal: No fracture is seen. Visualized bony pelvis appears intact. Dedicated lumbar spine CT was imaged and will be reported separately. IMPRESSION: No evidence of traumatic injury to the chest, abdomen, or pelvis. Status post cholecystectomy. Dilated common duct, measuring 17 mm, although smoothly tapering at the ampulla, likely postsurgical. Numerous bilateral renal cysts of varying sizes and complexity, with scattered peripheral calcifications, poorly evaluated unenhanced CT but grossly unchanged. Dedicated lumbar spine CT was imaged and will be reported separately. Electronically Signed   By: Julian Hy M.D.   On: 06/13/2021 03:41   CT Head Wo Contrast  Result Date: 06/13/2021 CLINICAL DATA:  Head trauma.  Fall. EXAM: CT HEAD WITHOUT CONTRAST CT MAXILLOFACIAL WITHOUT CONTRAST CT CERVICAL SPINE WITHOUT CONTRAST TECHNIQUE: Multidetector CT imaging of the head, cervical spine, and maxillofacial structures were performed using the standard protocol without intravenous contrast. Multiplanar CT image reconstructions of the cervical spine and maxillofacial structures were also generated. COMPARISON:  None. FINDINGS: CT HEAD FINDINGS Brain: There is no mass, hemorrhage or extra-axial collection. There is generalized atrophy without lobar predilection. There is hypoattenuation of the periventricular white matter, most commonly indicating chronic ischemic microangiopathy. Old right basal ganglia small vessel infarct.  Vascular: No abnormal hyperdensity of the major intracranial arteries or dural venous sinuses. No intracranial atherosclerosis. Skull: The visualized skull base, calvarium and extracranial soft tissues are normal. CT MAXILLOFACIAL FINDINGS Osseous: --Complex facial fracture types: No Le[place], zygomaticomaxillary complex or nasoorbitoethmoidal fracture. --Simple fracture types: None. --Mandible: No fracture or dislocation. Orbits: The globes are intact. Normal appearance of the intra- and extraconal fat. Symmetric extraocular muscles and optic nerves. Sinuses: No fluid levels or advanced mucosal thickening. Soft tissues: Normal visualized extracranial soft tissues. CT CERVICAL SPINE FINDINGS Alignment: No static subluxation. Facets are aligned. Occipital condyles and the lateral masses of C1-C2 are aligned. Skull base and vertebrae: No acute fracture. Soft tissues and spinal canal: No prevertebral fluid or swelling. No visible canal hematoma. Disc levels: No advanced spinal canal or neural foraminal stenosis. Upper chest: No pneumothorax, pulmonary nodule or pleural effusion. Other: Normal visualized paraspinal cervical soft tissues. IMPRESSION: 1. Chronic ischemic microangiopathy and generalized atrophy without acute intracranial abnormality. 2. No facial fracture. 3.  No acute fracture or static subluxation of the cervical spine. Electronically Signed   By: Ulyses Jarred M.D.   On: 06/13/2021 03:45   CT Chest Wo Contrast  Result Date: 06/13/2021 CLINICAL DATA:  Trauma/fall, back pain EXAM: CT CHEST, ABDOMEN AND PELVIS WITHOUT CONTRAST TECHNIQUE: Multidetector CT imaging of the chest, abdomen and pelvis was performed following the standard protocol without IV contrast. COMPARISON:  CT abdomen/pelvis dated 11/09/2020. FINDINGS: CT CHEST FINDINGS Cardiovascular: The heart is normal in size. Trace pericardial fluid. No evidence of thoracic aortic aneurysm. Mild atherosclerotic calcifications of the aortic arch. Mild  coronary atherosclerosis of the LAD and left circumflex. Left subclavian pacemaker. Mediastinum/Nodes: No suspicious mediastinal lymphadenopathy. Visualized thyroid is unremarkable. Lungs/Pleura: Minimal dependent atelectasis in the bilateral lower lobes. Mild centrilobular emphysematous changes, upper lung predominant. No focal consolidation. No suspicious pulmonary nodules. No pleural effusion or pneumothorax. Musculoskeletal: Mild degenerative changes of the lower thoracic spine. No fracture is seen. Bilateral clavicles, scapulae, and ribs are intact. Sternum is intact. CT ABDOMEN PELVIS FINDINGS Hepatobiliary: Unenhanced liver is grossly unremarkable. Pneumobilia. Status post cholecystectomy. No intrahepatic ductal dilatation. Dilated common duct, measuring 17 mm, although smoothly tapering at the ampulla, likely postsurgical. Pancreas: Within normal limits. Spleen: Within normal limits. Adrenals/Urinary Tract: Adrenal glands are within normal limits. Multiple bilateral renal cysts of varying sizes and complexities, with peripheral rim calcifications and possible parenchymal calcifications, poorly evaluated on unenhanced CT but grossly unremarkable. Representative cyst measures 3.0 cm in the medial right lower kidney (series 4/image 31). No hydronephrosis. Bladder is within normal limits. Stomach/Bowel: Stomach is within normal limits. No evidence of bowel obstruction. Appendix is not discretely visualized. No colonic wall thickening or inflammatory changes. Vascular/Lymphatic: No evidence of abdominal aortic aneurysm. Atherosclerotic calcifications of the abdominal aorta and branch vessels. No suspicious abdominopelvic lymphadenopathy. Reproductive: Status post hysterectomy. No adnexal masses. Other: No abdominopelvic ascites. No hemoperitoneum or free air. Musculoskeletal: No fracture is seen. Visualized bony pelvis appears intact. Dedicated lumbar spine CT was imaged and will be reported separately.  IMPRESSION: No evidence of traumatic injury to the chest, abdomen, or pelvis. Status post cholecystectomy. Dilated common duct, measuring 17 mm, although smoothly tapering at the ampulla, likely postsurgical. Numerous bilateral renal cysts of varying sizes and complexity, with scattered peripheral calcifications, poorly evaluated unenhanced CT but grossly unchanged. Dedicated lumbar spine CT was imaged and will be reported separately. Electronically Signed   By: Julian Hy M.D.   On: 06/13/2021 03:41   CT CERVICAL SPINE WO CONTRAST  Result Date: 06/13/2021 CLINICAL DATA:  Head trauma.  Fall. EXAM: CT HEAD WITHOUT CONTRAST CT MAXILLOFACIAL WITHOUT CONTRAST CT CERVICAL SPINE WITHOUT CONTRAST TECHNIQUE: Multidetector CT imaging of the head, cervical spine, and maxillofacial structures were performed using the standard protocol without intravenous contrast. Multiplanar CT image reconstructions of the cervical spine and maxillofacial structures were also generated. COMPARISON:  None. FINDINGS: CT HEAD FINDINGS Brain: There is no mass, hemorrhage or extra-axial collection. There is generalized atrophy without lobar predilection. There is hypoattenuation of the periventricular white matter, most commonly indicating chronic ischemic microangiopathy. Old right basal ganglia small vessel infarct. Vascular: No abnormal hyperdensity of the major intracranial arteries or dural venous sinuses. No intracranial atherosclerosis. Skull: The visualized skull base, calvarium and extracranial soft tissues are normal. CT MAXILLOFACIAL FINDINGS Osseous: --Complex facial fracture types: No LeFort, zygomaticomaxillary complex or nasoorbitoethmoidal fracture. --Simple fracture types: None. --Mandible: No fracture or dislocation. Orbits: The globes are intact. Normal appearance of the intra- and extraconal fat. Symmetric extraocular  muscles and optic nerves. Sinuses: No fluid levels or advanced mucosal thickening. Soft tissues:  Normal visualized extracranial soft tissues. CT CERVICAL SPINE FINDINGS Alignment: No static subluxation. Facets are aligned. Occipital condyles and the lateral masses of C1-C2 are aligned. Skull base and vertebrae: No acute fracture. Soft tissues and spinal canal: No prevertebral fluid or swelling. No visible canal hematoma. Disc levels: No advanced spinal canal or neural foraminal stenosis. Upper chest: No pneumothorax, pulmonary nodule or pleural effusion. Other: Normal visualized paraspinal cervical soft tissues. IMPRESSION: 1. Chronic ischemic microangiopathy and generalized atrophy without acute intracranial abnormality. 2. No facial fracture. 3. No acute fracture or static subluxation of the cervical spine. Electronically Signed   By: Ulyses Jarred M.D.   On: 06/13/2021 03:45   CT L-SPINE NO CHARGE  Result Date: 06/13/2021 CLINICAL DATA:  Fall EXAM: CT LUMBAR SPINE WITHOUT CONTRAST TECHNIQUE: Multidetector CT imaging of the lumbar spine was performed without intravenous contrast administration. Multiplanar CT image reconstructions were also generated. COMPARISON:  None. FINDINGS: Segmentation: 5 lumbar type vertebrae. Alignment: Normal. Vertebrae: No acute fracture or focal pathologic process. Paraspinal and other soft tissues: Negative. Disc levels: L3-4: Moderate spinal canal stenosis. Moderate right and mild left neural foraminal stenosis. L4-5: Small disc bulge. No spinal canal or neural foraminal stenosis. L5-S1: Moderate facet hypertrophy and small disc bulge. Severe bilateral neural foraminal stenosis. IMPRESSION: 1. No acute fracture or static subluxation of the lumbar spine. 2. L3-4 moderate spinal canal stenosis and moderate right and mild left neural foraminal stenosis. 3. Severe bilateral L5-S1 neural foraminal stenosis. Electronically Signed   By: Ulyses Jarred M.D.   On: 06/13/2021 03:38   DG HIP UNILAT WITH PELVIS 2-3 VIEWS LEFT  Result Date: 06/12/2021 CLINICAL DATA:  Fall this morning.  Left lateral hip and left hip joint pain. EXAM: DG HIP (WITH OR WITHOUT PELVIS) 2-3V LEFT COMPARISON:  X-ray pelvis 05/24/2021. CT abdomen pelvis 11/09/2020 FINDINGS: There is no evidence of hip fracture or dislocation of the left hip. No definite acute displaced fracture or diastasis of the bones of the pelvis on frontal view. No definite acute displaced fracture or dislocation of the right hip on frontal view. Markedly limited evaluation of the sacrum due to overlying bowel gas. Degenerative changes of the visualized lower lumbar spine. There is no evidence of severe left hip arthropathy. No aggressive appearing focal bone abnormality. IMPRESSION: Negative for acute traumatic injury. Electronically Signed   By: Iven Finn M.D.   On: 06/12/2021 18:30   CT Maxillofacial Wo Contrast  Result Date: 06/13/2021 CLINICAL DATA:  Head trauma.  Fall. EXAM: CT HEAD WITHOUT CONTRAST CT MAXILLOFACIAL WITHOUT CONTRAST CT CERVICAL SPINE WITHOUT CONTRAST TECHNIQUE: Multidetector CT imaging of the head, cervical spine, and maxillofacial structures were performed using the standard protocol without intravenous contrast. Multiplanar CT image reconstructions of the cervical spine and maxillofacial structures were also generated. COMPARISON:  None. FINDINGS: CT HEAD FINDINGS Brain: There is no mass, hemorrhage or extra-axial collection. There is generalized atrophy without lobar predilection. There is hypoattenuation of the periventricular white matter, most commonly indicating chronic ischemic microangiopathy. Old right basal ganglia small vessel infarct. Vascular: No abnormal hyperdensity of the major intracranial arteries or dural venous sinuses. No intracranial atherosclerosis. Skull: The visualized skull base, calvarium and extracranial soft tissues are normal. CT MAXILLOFACIAL FINDINGS Osseous: --Complex facial fracture types: No LeFort, zygomaticomaxillary complex or nasoorbitoethmoidal fracture. --Simple fracture types:  None. --Mandible: No fracture or dislocation. Orbits: The globes are intact. Normal appearance of the intra-  and extraconal fat. Symmetric extraocular muscles and optic nerves. Sinuses: No fluid levels or advanced mucosal thickening. Soft tissues: Normal visualized extracranial soft tissues. CT CERVICAL SPINE FINDINGS Alignment: No static subluxation. Facets are aligned. Occipital condyles and the lateral masses of C1-C2 are aligned. Skull base and vertebrae: No acute fracture. Soft tissues and spinal canal: No prevertebral fluid or swelling. No visible canal hematoma. Disc levels: No advanced spinal canal or neural foraminal stenosis. Upper chest: No pneumothorax, pulmonary nodule or pleural effusion. Other: Normal visualized paraspinal cervical soft tissues. IMPRESSION: 1. Chronic ischemic microangiopathy and generalized atrophy without acute intracranial abnormality. 2. No facial fracture. 3. No acute fracture or static subluxation of the cervical spine. Electronically Signed   By: Ulyses Jarred M.D.   On: 06/13/2021 03:45    Procedures Procedures   Medications Ordered in ED Medications  fentaNYL (SUBLIMAZE) injection 50 mcg (50 mcg Intravenous Given 06/13/21 0232)  ondansetron (ZOFRAN-ODT) disintegrating tablet 4 mg (4 mg Oral Given 06/13/21 0233)  cefTRIAXone (ROCEPHIN) 1 g in sodium chloride 0.9 % 100 mL IVPB (0 g Intravenous Stopped 06/13/21 0600)  0.9 %  sodium chloride infusion ( Intravenous New Bag/Given 06/13/21 1540)    ED Course  I have reviewed the triage vital signs and the nursing notes.  Pertinent labs & imaging results that were available during my care of the patient were reviewed by me and considered in my medical decision making (see chart for details).    MDM Rules/Calculators/A&P                          70 year old female with a history of CKD stage III, CAD, HTN, sick sinus syndrome s/p pacemaker, chronic diastolic CHF, primary hyperparathyroidism who presents to the emergency  department with recurrent episodes of frequent falls.  She has been having these episodes for the last 8 months, but they have been infrequent--1 every 2 to 3 months.  However, over the last 3 days, she has had greater than 10 episodes that have occurred with standing, sitting, and laying flat.  She will have episodes of shaking of all 4 extremities that will last for no longer than 30 seconds.  She does not lose consciousness.  She has no incontinence.  Intermittent bradycardia.  She has mild hypertension.  No hypoxia or tachypnea.  The patient was discussed and independently evaluated with Dr. Leonette Monarch, attending physician.  No focal neurologic deficits on my exam.  Labs and imaging of been reviewed and independently interpreted by me.  In regards to the patient's recurrent falls, no acute fractures or dislocations.  She does have some severe bilateral L5-S1 neural foraminal stenosis and L3-L4 moderate spinal canal stenosis.  She has numerous bilateral renal cysts of varying size and complexity.  CT head with chronic ischemic micro angiopathy and generalized atrophy without acute intracranial abnormality.  No fractures of the face, scalp, or spine.  Symptoms in the patient's legs could be secondary to changes seen on CT lumbar spine, but this would not explain changes in the patient's upper extremities.  BUN/creatinine 34/2.51 up from 10/1.54 in December.  Question prerenal azotemia.  She already received 500 cc of fluids in her PCPs office earlier and does have a history of chronic diastolic CHF.  We will start the patient on normal saline infusion and gentle hydration.  She has a leukocytosis of 14.  UA with large leukocyte esterase and pyuria.  We will send for urine culture.  She was  given a dose of Rocephin for UTI.  No other metabolic derangements.  Given concern for myalgias, CK was obtained and is normal.  Given her symptoms, was concern for movement disorders versus atypical seizures and consulted  neurology.  The patient was seen and evaluated by Dr. Lorrin Goodell. He recommends MRI brain and C-spine without contrast as well as rEEG in AM TSH, cortisol a.m., and orthostatic vitals x1.  He also recommends discontinuing metoclopramide if okay with primary team.  Consult appreciated.  Patient will require further work-up and evaluation in the inpatient setting.  Consulted the hospitalist team and Dr. Cyd Silence will accept the patient for admission. The patient appears reasonably stabilized for admission considering the current resources, flow, and capabilities available in the ED at this time, and I doubt any other [facility] requiring further screening and/or treatment in the ED prior to admission.   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

## 2021-06-13 NOTE — Progress Notes (Signed)
Pts pacer leads are not MR compatible. Unable to proceed with any MRI.

## 2021-06-13 NOTE — Progress Notes (Signed)
CSW received a call from patients social worker, Ananda at [place]. CSW provided Education officer, museum with an update on patient. Social workers number is [phone]

## 2021-06-13 NOTE — H&P (Addendum)
Date: 06/13/2021               Patient Name:  Melissa Lopez MRN: [id number]  DOB: 05-25-1951 Age / Sex: 70 y.o., female   PCP: [facility] [place]         Medical Service: Internal Medicine Teaching Service         Attending Physician: Dr. Velna Ochs, MD    First Contact: Wayland Denis, MD Pager: [phone]  Second Contact: Tamsen Snider, MD Pager: Cherly Hensen [phone]        After Hours (After 5p/  First Contact Pager: [phone]  weekends / holidays): Second Contact Pager: [phone]   SUBJECTIVE   Chief Complaint: Shaking and falls  History of Present Illness: Melissa Lopez is a 70 y.o. female with a pertinent PMH of CKD stage 3, nonobstructive CAD (cath 11/2020), HTN, SSS s/p pacemaker, hypercalcemia, primary hyperparathyroidism, T2DM, who presents to [facility] with shaking and falls.  Considering the patient does have some underlying difficulties with mentation, much of the patient's history was provided by the patient's daughter.  She states that roughly 8 months ago the patient first started developing recurrent episodes of shaking and falling.  She states that these episodes would happen roughly once a month but would be sporadic.  Episodes would occur while ambulating for a few steps.  The patient and her daughter describe these episodes of sudden weakness of her knees.  Although the patient states that she may have lost consciousness once or twice, the patient's daughter states that her eyes are open the entire time, although she does appear to have stuttered speech and slowing of her mentation.  During the episode she also has significant full body shaking that her daughter describes as shivering without tongue biting or urinary incontinence. The episodes last roughly 5 to 30 seconds.  The patient will snap out of the episode and immediately realize that she has fallen but will appear mildly confused. The confusion can last up to 1 hour at times. The patient endorses  knowing that these episodes will occur prior to their onset stating that she will feel a sudden weakness in her knees and will call out to her daughters stating "I am about to fall".  During these episodes she denies any chest pain, shortness of breath, palpitations, anxiety/distress, dizziness/lightheadedness, changes in vision or sensation.  These episodes have recently progressed over the last week, in which she is experiencing up to 10 episodes in the last 3 days.  Recently she has had 2 of the following episodes while she was sitting or laying down which is different from her previous episodes.  Patient does have some baseline cognitive impairment.  The daughter states that she has recently had some difficulties with forgetting things, concentration, and some confusion.  This has been going on for roughly a year.  She has been evaluated by neurology for her symptoms who stated that she is likely suffering from orthostatic hypotension.  At that time many of her blood pressure medications have been discontinued.   Patient has a history of depression/anxiety, for which she is taking many centrally acting medications including Lexapro, trazodone, Wellbutrin, gabapentin which could be contributing to her presenting symptoms.  Furthermore she does have a significant history of diabetes complicated by gastroparesis and her Reglan was recently discontinued.  Otherwise, patient does have a history of hyperparathyroidism with significant constipation requiring daily laxative use to relieve her symptoms that could be contributing to her symptoms.  Otherwise, she denies any fevers, chills, shortness of breath, cough, dysuria, hematuria, urinary continence, focal neurologic deficits, recent travel, sick contacts or new medications.  She denies any significant alcohol use or illicit drug use.   Medications: Current Outpatient Medications on File Prior to Encounter  Medication Sig Dispense Refill   acetaminophen  (TYLENOL) 650 MG CR tablet Take 1,300 mg by mouth every 8 (eight) hours as needed for pain.     amLODipine (NORVASC) 10 MG tablet TAKE 1 TABLET BY MOUTH EVERY DAY (Patient not taking: Reported on 04/16/2021) 90 tablet 3   aspirin 81 MG chewable tablet Chew 1 tablet (81 mg total) by mouth daily. 30 tablet 0   buPROPion (WELLBUTRIN SR) 150 MG 12 hr tablet Take 150 mg by mouth daily.     carvedilol (COREG) 6.25 MG tablet Take 6.25 mg by mouth 2 (two) times daily with a meal.     escitalopram (LEXAPRO) 20 MG tablet Take 20 mg by mouth daily.     gabapentin (NEURONTIN) 100 MG capsule Take 100 mg by mouth 3 (three) times daily.     hydrALAZINE (APRESOLINE) 25 MG tablet Take 25 mg by mouth at bedtime.     insulin degludec (TRESIBA FLEXTOUCH) 100 UNIT/ML FlexTouch Pen Inject 5 Units into the skin daily.     lisinopril (ZESTRIL) 20 MG tablet Take 1 tablet (20 mg total) by mouth daily. 90 tablet 3   LORazepam (ATIVAN) 1 MG tablet Take 1 mg by mouth daily as needed for anxiety. (Patient not taking: Reported on 04/16/2021)     metoCLOPramide (REGLAN) 5 MG tablet Take 1 tablet (5 mg total) by mouth 3 (three) times daily for 14 days. 42 tablet 0   Multiple Vitamin (MULTIVITAMIN WITH MINERALS) TABS tablet Take 1 tablet by mouth daily.     Nutritional Supplements (,FEEDING SUPPLEMENT, PROSOURCE PLUS) liquid Take 30 mLs by mouth daily. 887 mL 0   omeprazole (PRILOSEC) 20 MG capsule Take 20 mg by mouth daily.     ondansetron (ZOFRAN) 4 MG tablet Take 1 tablet (4 mg total) by mouth every 6 (six) hours. 12 tablet 0   oxybutynin (DITROPAN-XL) 10 MG 24 hr tablet Take 10 mg by mouth daily.      polyethylene glycol (MIRALAX / GLYCOLAX) 17 g packet Take 17 g by mouth 2 (two) times daily. 14 each 0   rosuvastatin (CRESTOR) 5 MG tablet Take one tablet daily 90 tablet 3   traZODone (DESYREL) 100 MG tablet Take 100 mg by mouth at bedtime.       Past Medical History:  Past Medical History:  Diagnosis Date   CKD (chronic  kidney disease) stage 3, GFR 30-59 ml/min ([place]) 10/31/2018   - per patient report   Coronary artery disease    per patient report - 2 caths with non-obstructive CAD   Essential hypertension    Frequent falls    H/O cardiac pacemaker 10/31/2018   implanted 05/2010   Sick sinus syndrome ([place])    Type 2 diabetes mellitus with complication, with long-term current use of insulin ([place]) 10/31/2018   with diabetic nephropathy    Social:  Lives - [place] with daughter Occupation - retired Research officer, trade [employer] - daughter [name]) Level of function - walks with a walker PCP - [facility] Substance use - former tobacco use and quit 51 years ago, no ETOH, or illicit drugs.  Family History: Family History  Problem Relation Age of Onset   Breast cancer Mother    Bone  cancer Mother    Heart attack Father 31   Breast cancer Sister    Heart disease Brother        stents   Other Sister        MVA   Diabetes Brother     Allergies: Allergies as of 06/12/2021 - Review Complete 06/12/2021  Allergen Reaction Noted   Contrast media [iodinated diagnostic agents] Swelling 04/13/2020   Oxycodone-acetaminophen Anaphylaxis 10/31/2018   Xarelto  [rivaroxaban] Swelling 04/13/2020   Oxycodone Other (See Comments) 04/13/2020    Review of Systems: A complete ROS was negative except as per HPI.   OBJECTIVE:   Physical Exam: Blood pressure (!) 162/95, pulse 64, temperature 98 F (36.7 C), temperature source Oral, resp. rate 18, height 5\' 5"  (1.651 m), weight 63.5 kg, SpO2 98 %. Physical Exam Constitutional:      General: She is not in acute distress.    Appearance: She is not ill-appearing.     Comments: Slow mentation and appears older than stated age  HENT:     Head: Normocephalic and atraumatic.  Eyes:     Extraocular Movements: Extraocular movements intact.  Cardiovascular:     Rate and Rhythm: Normal rate and regular rhythm.     Pulses: Normal pulses.     Heart sounds: Normal heart sounds. No  murmur heard.   No gallop.  Pulmonary:     Effort: Pulmonary effort is normal.     Breath sounds: Normal breath sounds.  Abdominal:     General: Abdomen is flat. Bowel sounds are normal. There is no distension.  Musculoskeletal:        General: No swelling. Normal range of motion.     Cervical back: Normal range of motion.  Skin:    General: Skin is warm and dry.  Neurological:     General: No focal deficit present.     Mental Status: She is lethargic.     Cranial Nerves: No dysarthria.     Sensory: No sensory deficit.     Motor: Weakness (generalized weakness) present.     Coordination: Coordination is intact.     Comments: No tremor or ridgidity    Labs: CBC    Component Value Date/Time   WBC 14.1 (H) 06/12/2021 1539   RBC 4.28 06/12/2021 1539   HGB 13.4 06/12/2021 1539   HCT 40.2 06/12/2021 1539   PLT 220 06/12/2021 1539   MCV 93.9 06/12/2021 1539   MCH 31.3 06/12/2021 1539   MCHC 33.3 06/12/2021 1539   RDW 12.7 06/12/2021 1539   LYMPHSABS 4.6 (H) 06/12/2021 1539   MONOABS 1.2 (H) 06/12/2021 1539   EOSABS 0.2 06/12/2021 1539   BASOSABS 0.1 06/12/2021 1539     CMP     Component Value Date/Time   NA 136 06/12/2021 1539   K 3.9 06/12/2021 1539   CL 108 06/12/2021 1539   CO2 22 06/12/2021 1539   GLUCOSE 101 (H) 06/12/2021 1539   BUN 34 (H) 06/12/2021 1539   CREATININE 2.51 (H) 06/12/2021 1539   CALCIUM 11.7 (H) 06/12/2021 1539   CALCIUM 13.5 (HH) 09/22/2020 2032   PROT 6.8 06/12/2021 1539   ALBUMIN 3.6 06/12/2021 1539   AST 13 (L) 06/12/2021 1539   ALT 12 06/12/2021 1539   ALKPHOS 65 06/12/2021 1539   BILITOT 0.7 06/12/2021 1539   GFRNONAA 20 (L) 06/12/2021 1539   GFRAA 34 (L) 11/12/2018 0342    Imaging: CT ABDOMEN PELVIS WO CONTRAST CT Chest Wo Contrast Result Date:  06/13/2021 No evidence of traumatic injury to the chest, abdomen, or pelvis. Status post cholecystectomy. Dilated common duct, measuring 17 mm, although smoothly tapering at the ampulla,  likely postsurgical. Numerous bilateral renal cysts of varying sizes and complexity, with scattered peripheral calcifications, poorly evaluated unenhanced CT but grossly unchanged.   CT Head Wo Contrast CT CERVICAL SPINE WO CONTRAST CT Maxillofacial Wo Contrast Result Date: 06/13/2021 1. Chronic ischemic microangiopathy and generalized atrophy without acute intracranial abnormality.  2. No facial fracture.  3. No acute fracture or static subluxation of the cervical spine.   CT L-SPINE NO CHARGE Result Date: 06/13/2021 1. No acute fracture or static subluxation of the lumbar spine.  2. L3-4 moderate spinal canal stenosis and moderate right and mild left neural foraminal stenosis.  3. Severe bilateral L5-S1 neural foraminal stenosis.   DG HIP UNILAT WITH PELVIS 2-3 VIEWS LEFT Result Date: 06/12/2021  Negative for acute traumatic injury.    EKG: personally reviewed my interpretation is sinus rhythm, atrial pacing with t-wave inversions inferior/lateral leads consistent with prior EKGs.  ASSESSMENT & PLAN:   Active Problems:   Syncope   Seizure-like activity (HCC)   Ahlaya Ende is a 70 y.o. with pertinent PMH of CKD stage III, CAD, HTN, sick sinus syndrome s/p pacemaker, HFpEF, primary hyperparathyroidism, hypercalcemia who presented with shaking and falls and admit for further evaluation and management on hospital day 0   #Seizure-like activity #Presyncope/syncope Presents with signs and symptoms concerning for syncope versus seizure-like activity.  Has significant risk factors for syncope including multiple centrally acting medications, diabetes with possible autonomic dysregulation, and multiple antihypertensive medications which could be contributing to her underlying presentation.  Although the fact that the patient does not experience any dizziness or lightheadedness prior to each episode, makes orthostatic hypotension questionable.  However, patient does have a significant  history of sick sinus syndrome status post pacemaker and nonobstructive coronary artery disease.  Therefore, is difficult to rule out structural heart disease as a contributor to her symptoms.  Alternatively, recent CT scan without contrast does show many areas of hypoattenuation in the periventricular white matter consistent with chronic ischemic microangiopathy and an old right basal ganglia infarct.  There is no evidence of acute ischemic infarct and is difficult to determine if her previous ischemic events are contributing to her underlying mentation and syncopal/presyncopal spells.  EEG was performed on admission without any evidence of seizure-like activity.  Neurology has been consulted and recommend follow-up with an MRI. -Orthostatic vitals pending -Limit centrally acting medications - EEG negative, may need continuous EEG -Echocardiogram to evaluate for structural heart disease -Cardiac telemetry for underlying arrhythmias -We will get metabolic panel and electrolytes -We will need an MRI or follow-up CT scan if possible -Received neurology's recommendations.  #Acute on Chronic Kidney Disease (Stage IIIB:  Patient presents with acute elevation in creatinine to 2.51 from a baseline of 1.5 and a GFR of 20 from a baseline of 36.  She appears to have baseline CKD stage IIIb likely secondary to underlying diabetes and hypertension.  Patient likely has prerenal azotemia in setting of decreased p.o. intake, but random urine sodium and creatinine are pending.  CT abdomen pelvis did not show any acute urinary issues she does however have multiple bilateral renal cysts and varying size and complexity is which may need further evaluation.  She has received 1 L of IV fluids in the ED and then encouraged to take n.p.o. fluids.  Repeat metabolic panel pending to see if kidney function  is improved.  Urinalysis however does show some rare bacteria and large leuks but otherwise bland urine sediment.  She was  initially given 1 dose of ceftriaxone in the ED, although she does not have any significant urinary symptoms to correlate with UTI.  Patient does not have any emergent need for renal placement therapy at this time. -Random urine sodium and creatinine -Repeat metabolic panel for kidney function -Strict ins and outs to trend urine output -Avoid nephrotoxic medications  #Leukocytosis: Patient presented with mild leukocytosis of 14.  She is otherwise hemodynamic stable, afebrile, with normal heart rate.  She does have some baseline cognitive deficits but otherwise does not appear to have any systemic signs of infection.  Patient was given a dose of ceftriaxone in the ED due to concern for possible urinary tract infection.  Patient does not express any symptoms of urinary tract infection nor does her daughter endorse any past symptoms of dysuria.  Therefore, I will hold off on further treatment for her urinary tract infection.  I suspect her leukocytosis will improve with repeat CBC.  If not, patient may benefit further evaluation for infectious etiologies.  #T2DM Patient has a history of type 2 diabetes on Tresiba 5 units daily.  Her last hemoglobin A1c was 6.5.  She does however have significant complications associated with her diabetes including diabetic nephropathy and gastroparesis.  She was recently taken off of Reglan due to concerns for possible changes in her mentation -We will start sliding scale insulin and CBG monitoring  #HTN Patient has a significant history of hypertension on multiple blood pressure medications including amlodipine 10 mg, carvedilol 6.25 mg twice daily, hydralazine 25 mg nightly, lisinopril 20 mg daily.  Patient's daughter states that the amlodipine and the hydralazine were recently discontinued due to concern for orthostatic hypotension.  Patient blood pressure today is 162/95.  Will likely restart her carvedilol today and plan to restart lisinopril in the near future if  her kidney function improves.  #CAD #HLD: Patient has a history of hyperlipidemia CAD.  Patient had EKG findings several years back concerning for NSTEMI.  Cardiac catheterization was done and showed nonobstructive CAD.  Patient is currently on rosuvastatin 5 mg daily. -Continue rosuvastatin 5 mg daily  #HFpEF: Patient has a history of heart failure with preserved ejection fraction.  Her last echo was performed in 11/27/2020 and showed an EF of 60 to 65% with grade 1 diastolic dysfunction.  Patient appears volume down on exam.  Furthermore she is not on any diuretic therapy at home.  #Sick sinus syndrome Patient has sick sinus syndrome status post pacemaker.  Recent interrogation was performed in the past several months without any problems.  #Urge incontinence: Patient admits to having some incontinence in the past.  Patient's daughter states that she feels like she is unable to make it to the toilet in time.  I will continue her oxybutynin while admitted, but it is important to keep in mind this may be contributing to some changes in mentation. -Continue home oxybutynin.  Diet: Heart Healthy VTE: Heparin IVF: LR,75cc/hr Code: Full  Anticipated Discharge Location:  TBD Barriers to Discharge: work up  Dispo: Admit patient to Observation with expected length of stay less than 2 midnights.   Lawerance Cruel, D.O.  Internal Medicine Resident, PGY-3 [facility]  Pager: [phone] 12:43 PM, 06/13/2021   Please contact the on call pager after 5 pm and on weekends at [phone].

## 2021-06-13 NOTE — Progress Notes (Signed)
  Echocardiogram 2D Echocardiogram has been performed.  Melissa Lopez 06/13/2021, 5:30 PM

## 2021-06-13 NOTE — ED Notes (Signed)
Per MRI, Pt Unable to have MRI d/t leads are not comparable.

## 2021-06-13 NOTE — Consult Note (Addendum)
NEUROLOGY CONSULTATION NOTE   Date of service: June 13, 2021 Patient Name: Melissa Lopez MRN:  [id number] DOB:  06/05/1951 Reason for consult: "falls and shaking" Requesting Provider: Fatima Blank, * _ _ _   _ __   _ __ _ _  __ __   _ __   __ _  History of Present Illness  Melissa Lopez is a 70 y.o. female with PMH significant for CKD3, CAD, HTN, pacemaker, sick sinus syndrome, DM2, sick sinus syndrome s/p PPM, chronic dCHF, hyperparathyroidism who presents to the ED for episode of shaking and fall.  Patient is a poor historian and she reports that she is here for falls. Had a couple today and thinks that her knees buckle and give out.  Per discussion with daughter, has been having these for 8 months. They would happen sometimes 1-2 a week, sometimes one every couple months.  Over the last 3 days, she has had about 10 of these. Has had 6 while standing up, 2 while laying flat on her bed, one sitting in the chair, one in the afternoon. She was told that this is due to dehydration and from her heart. She believed that since they were happening while she was standing up, now that these are happening while she is sitting down or lying flat, she is questioning if these truly are orthostatic or 2/2 dehydration.  Patient has very poor recollection of these events per daughter. She reports that her legs are weak and knees give out. Daughter reports that all of a sudden she starts shaking, her entire body stiffens up and back arches up, eyes roll to the back and she passes out. The entire thing lasts about 10 sec or less. Very hard to get to her in time and stop her from falling. She is back to herself immediately. Today when this happened, she did not even remember that she fell.  No prior hx of seizures, no CNS surgery, no CNS malignancy, no significant EtOH use, endorses prior hx of strokes, no hx of ICH, no significant head injury with LOC prior to these.  ROS   Constitutional Denies  weight loss, fever and chills.  HEENT Denies changes in vision and hearing.   Respiratory Denies SOB and cough.   CV Denies palpitations and CP   GI Denies abdominal pain, nausea, vomiting and diarrhea. Endorses constipation  GU Denies dysuria but endorses urinary frequency and incontinence.  MSK Endorses myalgia and joint pain.   Skin Denies rash and pruritus.  Neurological Denies headache and syncope.  Psychiatric Denies recent changes in mood. Denies anxiety and depression.   Past History   Past Medical History:  Diagnosis Date   CKD (chronic kidney disease) stage 3, GFR 30-59 ml/min (HCC) 10/31/2018   - per patient report   Coronary artery disease    per patient report - 2 caths with non-obstructive CAD   Essential hypertension    Frequent falls    H/O cardiac pacemaker 10/31/2018   implanted 05/2010   Sick sinus syndrome ([place])    Type 2 diabetes mellitus with complication, with long-term current use of insulin ([place]) 10/31/2018   with diabetic nephropathy   Past Surgical History:  Procedure Laterality Date   BIOPSY  09/28/2020   Procedure: BIOPSY;  Surgeon: Ronald Lobo, MD;  Location: [facility];  Service: Endoscopy;;   CARDIAC PACEMAKER PLACEMENT  2008   in [place] for sick sinus syndrome   CORONARY/GRAFT ACUTE MI REVASCULARIZATION N/A 11/09/2020  Procedure: CORONARY/GRAFT ACUTE MI REVASCULARIZATION;  Surgeon: Leonie Man, MD;  Location: [facility];  Service: Cardiovascular;  Laterality: N/A;   ESOPHAGOGASTRODUODENOSCOPY N/A 09/28/2020   Procedure: ESOPHAGOGASTRODUODENOSCOPY (EGD);  Surgeon: Ronald Lobo, MD;  Location: [facility];  Service: Endoscopy;  Laterality: N/A;   ESOPHAGOGASTRODUODENOSCOPY (EGD) WITH PROPOFOL N/A 11/10/2018   Procedure: ESOPHAGOGASTRODUODENOSCOPY (EGD) WITH PROPOFOL;  Surgeon: Wilford Corner, MD;  Location: [facility];  Service: Endoscopy;  Laterality: N/A;   LEFT HEART CATH AND CORONARY ANGIOGRAPHY N/A  10/31/2018   Procedure: LEFT HEART CATH AND CORONARY ANGIOGRAPHY;  Surgeon: Leonie Man, MD;  Location: [facility];  Service: Cardiovascular;  Laterality: N/A;   PACEMAKER GENERATOR CHANGE  09/2018   in [place]   Family History  Problem Relation Age of Onset   Breast cancer Mother    Bone cancer Mother    Heart attack Father 60   Breast cancer Sister    Heart disease Brother        stents   Other Sister        MVA   Diabetes Brother    Social History   Socioeconomic History   Marital status: Single    Spouse name: Not on file   Number of children: 4   Years of education: Not on file   Highest education level: Some college, no degree  Occupational History    Comment: retired  Tobacco Use   Smoking status: Former    Years: 3.00    Pack years: 0.00    Types: Cigarettes    Quit date: 12/09/1969    Years since quitting: 51.5   Smokeless tobacco: Never  Substance and Sexual Activity   Alcohol use: Not Currently   Drug use: Not Currently    Comment: as teenager   Sexual activity: Not on file  Other Topics Concern   Not on file  Social History Narrative   04/16/21 Moved from [place] and lives with daughter, Melissa Lopez in [place].   Now in [employer]   Social Determinants of Health   Financial Resource Strain: Not on file  Food Insecurity: Not on file  Transportation Needs: Not on file  Physical Activity: Not on file  Stress: Not on file  Social Connections: Not on file   Allergies  Allergen Reactions   Contrast Media [Iodinated Diagnostic Agents] Swelling    Contrast Dye - the one made out of shellfish, gives a really bad reaction - swelling   Oxycodone-Acetaminophen Anaphylaxis   Xarelto  [Rivaroxaban] Swelling   Oxycodone Other (See Comments)    Statused by Person:  EAST, CAMELIA(CE) on [phone]    Medications  (Not in a hospital admission)    Vitals   Vitals:   06/13/21 0008 06/13/21 0040 06/13/21 0146 06/13/21 0215  BP: (!) 158/102 (!)  198/104 (!) 168/104 (!) 168/109  Pulse: 64 64 60 60  Resp: 20 17 14 15   Temp:      TempSrc:      SpO2: 100% 100% 100% 99%  Weight:  63.5 kg    Height:  5\' 5"  (1.651 m)       Body mass index is 23.3 kg/m.  Physical Exam   General: Laying comfortably in bed; in no acute distress.  HENT: Normal oropharynx and mucosa. Normal external appearance of ears and nose.  Neck: Supple, no pain or tenderness  CV: No JVD. No peripheral edema.  Pulmonary: Symmetric Chest rise. Normal respiratory effort.  Abdomen: Soft to touch, non-tender.  Ext: No cyanosis, edema, or deformity  Skin: No rash. Normal palpation of skin.   Musculoskeletal: Normal digits and nails by inspection. No clubbing.   Neurologic Examination  Mental status/Cognition: Alert, oriented to self, place, month and year, good attention.  Speech/language: Fluent, comprehension intact, object naming intact, repetition intact. Cranial nerves:   CN II Pupils equal and reactive to light, no VF deficits    CN III,IV,VI EOM intact, no gaze preference or deviation, no nystagmus    CN V normal sensation in V1, V2, and V3 segments bilaterally    CN VII no asymmetry, no nasolabial fold flattening    CN VIII normal hearing to speech    CN IX & X normal palatal elevation, no uvular deviation    CN XI 5/5 head turn and 5/5 shoulder shrug bilaterally    CN XII midline tongue protrusion    Motor:  Muscle bulk: poor, tone: paratonia and orofacial dystonic movement, pronator drift none tremor none Mvmt Root Nerve  Muscle Right Left Comments  SA C5/6 Ax Deltoid 4+ 4+   EF C5/6 Mc Biceps 5 5   EE C6/7/8 Rad Triceps 5 5   WF C6/7 Med FCR     WE C7/8 PIN ECU     F Ab C8/T1 U ADM/FDI 5 5   HF L1/2/3 Fem Illopsoas 4 4   KE L2/3/4 Fem Quad 5 5   DF L4/5 D Peron Tib Ant 5 5   PF S1/2 Tibial Grc/Sol 5 5    Reflexes:  Right Left Comments  Pectoralis      Biceps (C5/6) 2+ 2+   Brachioradialis (C5/6) 2+ 2+    Triceps (C6/7) 2+ 2+     Patellar (L3/4) 3 3 With spread   Achilles (S1) 2 2    Hoffman + +    Plantar mute mute   Jaw jerk    Sensation:  Light touch intact   Pin prick Mildly decreased in BL upper extremities and BL lower extremities   Temperature Decreased in BL upper and lower ext   Vibration   Proprioception    Coordination/Complex Motor:  - Finger to Nose intact BL - Heel to shin intact BL - Rapid alternating movement are slowed. - Gait: unsafe to assess given weakness.  Labs   CBC:  Recent Labs  Lab 06/12/21 1539  WBC 14.1*  NEUTROABS 8.0*  HGB 13.4  HCT 40.2  MCV 93.9  PLT 235    Basic Metabolic Panel:  Lab Results  Component Value Date   NA 136 06/12/2021   K 3.9 06/12/2021   CO2 22 06/12/2021   GLUCOSE 101 (H) 06/12/2021   BUN 34 (H) 06/12/2021   CREATININE 2.51 (H) 06/12/2021   CALCIUM 11.7 (H) 06/12/2021   GFRNONAA 20 (L) 06/12/2021   GFRAA 34 (L) 11/12/2018   Lipid Panel:  Lab Results  Component Value Date   LDLCALC 94 11/01/2018   HgbA1c:  Lab Results  Component Value Date   HGBA1C 6.5 (H) 09/24/2020   Urine Drug Screen: No results found for: LABOPIA, COCAINSCRNUR, LABBENZ, AMPHETMU, THCU, LABBARB  Alcohol Level No results found for: [place]  CT Head without contrast: CTH was negative for a large hypodensity concerning for a large territory infarct or hyperdensity concerning for an ICH.  MRI Brain: Pending  MRI C spine: Pending  Labs with elevated Creatinine to 2.51. Baseline creatinine of 1.54.  Impression   Melissa Lopez is a 70 y.o. female with PMH significant for CKD3,  CAD, HTN, pacemaker, sick sinus syndrome, DM2, sick sinus syndrome s/p PPM, chronic dCHF, hyperparathyroidism who presents to the ED for episode of shaking -> stiffness and eyes roll back --> fall and return to her baseline all in 10 secs. These have been attributed to orhostasis and dehydration in the past and I do think that these are likely 2/2 the same. However, has had 10 in the last  3 days, 3 of which happened while she was lying flat and so questioning if this is truly orthostatic and if there is an alternate etiology.  I suspect that the frequent occurrence of these episode over the last couple of days is likely due to AKI and potential dehydration.  On exam, has hyperreflexia and decreased pinprick in BL upper and lower extremities. Also proximal greater than distal weakness in all extremities. Does endorse some cord signs on review of systems including urinary incontinence +  urgency + frequency and unable to completely empty her bladder, constipation. Denies lhermittes sign, no saddle anesthesia.  Of note, exam also demonstrates orofacial dyskinetic movements which I suspect are from metoclopramide.  Impression: Falls Orofacial dystonia Orthostatic hypotension Convulsive syncope AKI on CKD 3 Rule out cord compression Rule out seizures  Recommendations  - MRI Brain and C spine without contrast - rEEG in AM - TSH and Cortisol AM to evaluate for hormonal causes of orthostasis. - Orthostatic vitals x 1. - recommend discontinuing Metoclopramide if okay with primary team.   Non-Pharmacologic Recs for Orthostatic Hypotension 1) Lifestyle modification. These measures include:  - Arising slowly, in stages, from supine to seated to standing. This maneuver is most important in the morning, when orthostatic tolerance is lowest.  - Avoiding straining, coughing, and walking in hot weather.These activities reduce venous return and worsen orthostatic hypotension.  - Maintaining hydration and avoiding over-heating.  - Raising the head of the bed 30 to 45 degrees decreases renal perfusion, thereby activating the renin-angiotensin-aldosterone system and decreasing nocturnal diuresis, which can be pronounced in these patients.  These changes relieve orthostatic hypotension by expanding extracellular fluid volume and may reduce end organ damage by reducing supine HTN.  2)  Exercise - walking 30 minutes a day, exercise in a swimming pool, exercise in a recumbent or seated position (using a stationary bike or rowing machine)  3) Abdominal binders   4) Compression Stockings  5) Increased salt and water intake to 2 L to 2.5 L of water a day  6) Modification of meals:  - Avoiding large meals  - Ingesting meals low in carbohydrates  - Alcohol should be avoided during the day as it is a vasodilator  - Drink water with meals  - Avoiding activities or sudden standing immediately after eating  7) Physical Countermaneuvers during daily activities: leg crossing, standing on tip toes, squatting  ______________________________________________________________________   Thank you for the opportunity to take part in the care of this patient. If you have any further questions, please contact the neurology consultation attending.  Signed,  [place] Pager Number [phone] _ _ _   _ __   _ __ _ _  __ __   _ __   __ _

## 2021-06-13 NOTE — ED Notes (Signed)
Spoke to pt and provided nursing care update

## 2021-06-13 NOTE — ED Notes (Signed)
Pt is a poor historian and does not know what kind of pacemaker she has.

## 2021-06-13 NOTE — Plan of Care (Signed)
  Problem: Education: Goal: Knowledge of General Education information will improve Description Including pain rating scale, medication(s)/side effects and non-pharmacologic comfort measures Outcome: Progressing   

## 2021-06-13 NOTE — Procedures (Signed)
Patient Name: Jaylan Duggar  MRN: [id number]  Epilepsy Attending: Lora Havens  Referring Physician/Provider: Dr Donnetta Simpers Date: 06/13/2021 Duration: 23.08 mins  Patient history: 70 year old female who presents to the ED for episode of shaking -> stiffness and eyes roll back --> fall and return to her baseline all in 10 secs. EEG to evaluate for seizures.  Level of alertness: Awake  AEDs during EEG study: None  Technical aspects: This EEG study was done with scalp electrodes positioned according to the 10-20 International system of electrode placement. Electrical activity was acquired at a sampling rate of 500Hz  and reviewed with a high frequency filter of 70Hz  and a low frequency filter of 1Hz . EEG data were recorded continuously and digitally stored.   Description: The posterior dominant rhythm consists of 8Hz  activity of moderate voltage (25-35 uV) seen predominantly in posterior head regions, symmetric and reactive to eye opening and eye closing. EEG showed intermittent generalized 3 to 6 Hz theta-delta slowing. Hyperventilation and photic stimulation were not performed.     ABNORMALITY - Continuous slow, generalized  IMPRESSION: This study is suggestive of mild diffuse encephalopathy, nonspecific etiology. No seizures or epileptiform discharges were seen throughout the recording.  Katurah Karapetian Barbra Sarks

## 2021-06-13 NOTE — Progress Notes (Signed)
EEG completed, results pending. 

## 2021-06-14 ENCOUNTER — Observation Stay (HOSPITAL_COMMUNITY): Payer: Medicare (Managed Care)

## 2021-06-14 ENCOUNTER — Inpatient Hospital Stay (HOSPITAL_COMMUNITY): Payer: Medicare (Managed Care)

## 2021-06-14 DIAGNOSIS — N3000 Acute cystitis without hematuria: Secondary | ICD-10-CM | POA: Diagnosis present

## 2021-06-14 DIAGNOSIS — F0390 Unspecified dementia without behavioral disturbance: Secondary | ICD-10-CM | POA: Diagnosis present

## 2021-06-14 DIAGNOSIS — N179 Acute kidney failure, unspecified: Secondary | ICD-10-CM

## 2021-06-14 DIAGNOSIS — N1832 Chronic kidney disease, stage 3b: Secondary | ICD-10-CM | POA: Diagnosis present

## 2021-06-14 DIAGNOSIS — D631 Anemia in chronic kidney disease: Secondary | ICD-10-CM | POA: Diagnosis not present

## 2021-06-14 DIAGNOSIS — D649 Anemia, unspecified: Secondary | ICD-10-CM | POA: Diagnosis present

## 2021-06-14 DIAGNOSIS — I951 Orthostatic hypotension: Secondary | ICD-10-CM

## 2021-06-14 DIAGNOSIS — Y92531 Health care provider office as the place of occurrence of the external cause: Secondary | ICD-10-CM | POA: Diagnosis not present

## 2021-06-14 DIAGNOSIS — R112 Nausea with vomiting, unspecified: Secondary | ICD-10-CM

## 2021-06-14 DIAGNOSIS — F32A Depression, unspecified: Secondary | ICD-10-CM | POA: Diagnosis present

## 2021-06-14 DIAGNOSIS — I69398 Other sequelae of cerebral infarction: Secondary | ICD-10-CM | POA: Diagnosis not present

## 2021-06-14 DIAGNOSIS — W1839XA Other fall on same level, initial encounter: Secondary | ICD-10-CM | POA: Diagnosis present

## 2021-06-14 DIAGNOSIS — Z20822 Contact with and (suspected) exposure to covid-19: Secondary | ICD-10-CM | POA: Diagnosis present

## 2021-06-14 DIAGNOSIS — R200 Anesthesia of skin: Secondary | ICD-10-CM | POA: Diagnosis present

## 2021-06-14 DIAGNOSIS — R569 Unspecified convulsions: Secondary | ICD-10-CM

## 2021-06-14 DIAGNOSIS — E876 Hypokalemia: Secondary | ICD-10-CM | POA: Diagnosis present

## 2021-06-14 DIAGNOSIS — I5032 Chronic diastolic (congestive) heart failure: Secondary | ICD-10-CM | POA: Diagnosis present

## 2021-06-14 DIAGNOSIS — E1122 Type 2 diabetes mellitus with diabetic chronic kidney disease: Secondary | ICD-10-CM | POA: Diagnosis present

## 2021-06-14 DIAGNOSIS — R292 Abnormal reflex: Secondary | ICD-10-CM | POA: Diagnosis present

## 2021-06-14 DIAGNOSIS — E1165 Type 2 diabetes mellitus with hyperglycemia: Secondary | ICD-10-CM | POA: Diagnosis present

## 2021-06-14 DIAGNOSIS — G9349 Other encephalopathy: Secondary | ICD-10-CM | POA: Diagnosis present

## 2021-06-14 DIAGNOSIS — E872 Acidosis: Secondary | ICD-10-CM | POA: Diagnosis not present

## 2021-06-14 DIAGNOSIS — E210 Primary hyperparathyroidism: Secondary | ICD-10-CM | POA: Diagnosis present

## 2021-06-14 DIAGNOSIS — I495 Sick sinus syndrome: Secondary | ICD-10-CM | POA: Diagnosis present

## 2021-06-14 DIAGNOSIS — N183 Chronic kidney disease, stage 3 unspecified: Secondary | ICD-10-CM | POA: Diagnosis not present

## 2021-06-14 DIAGNOSIS — E1143 Type 2 diabetes mellitus with diabetic autonomic (poly)neuropathy: Secondary | ICD-10-CM | POA: Diagnosis present

## 2021-06-14 DIAGNOSIS — F05 Delirium due to known physiological condition: Secondary | ICD-10-CM | POA: Diagnosis not present

## 2021-06-14 DIAGNOSIS — Z9181 History of falling: Secondary | ICD-10-CM | POA: Diagnosis not present

## 2021-06-14 DIAGNOSIS — G259 Extrapyramidal and movement disorder, unspecified: Secondary | ICD-10-CM | POA: Diagnosis present

## 2021-06-14 DIAGNOSIS — Y9301 Activity, walking, marching and hiking: Secondary | ICD-10-CM | POA: Diagnosis present

## 2021-06-14 DIAGNOSIS — I130 Hypertensive heart and chronic kidney disease with heart failure and stage 1 through stage 4 chronic kidney disease, or unspecified chronic kidney disease: Secondary | ICD-10-CM | POA: Diagnosis present

## 2021-06-14 LAB — COMPREHENSIVE METABOLIC PANEL
ALT: 29 U/L (ref 0–44)
ALT: 28 U/L (ref 0–44)
AST: 40 U/L (ref 15–41)
AST: 41 U/L (ref 15–41)
Albumin: 4.5 g/dL (ref 3.5–5.0)
Albumin: 4.2 g/dL (ref 3.5–5.0)
Alkaline Phosphatase: 88 U/L (ref 38–126)
Alkaline Phosphatase: 83 U/L (ref 38–126)
Anion gap: 15 (ref 5–15)
Anion gap: 12 (ref 5–15)
BUN: 25 mg/dL — ABNORMAL HIGH (ref 8–23)
BUN: 27 mg/dL — ABNORMAL HIGH (ref 8–23)
CO2: 18 mmol/L — ABNORMAL LOW (ref 22–32)
CO2: 17 mmol/L — ABNORMAL LOW (ref 22–32)
Calcium: 14.1 mg/dL (ref 8.9–10.3)
Calcium: 13.2 mg/dL (ref 8.9–10.3)
Chloride: 104 mmol/L (ref 98–111)
Chloride: 108 mmol/L (ref 98–111)
Creatinine, Ser: 2.03 mg/dL — ABNORMAL HIGH (ref 0.44–1.00)
Creatinine, Ser: 1.89 mg/dL — ABNORMAL HIGH (ref 0.44–1.00)
GFR, Estimated: 26 mL/min — ABNORMAL LOW (ref >60)
GFR, Estimated: 28 mL/min — ABNORMAL LOW (ref >60)
Glucose, Bld: 265 mg/dL — ABNORMAL HIGH (ref 70–99)
Glucose, Bld: 179 mg/dL — ABNORMAL HIGH (ref 70–99)
Potassium: 3.5 mmol/L (ref 3.5–5.1)
Potassium: 3.5 mmol/L (ref 3.5–5.1)
Sodium: 137 mmol/L (ref 135–145)
Sodium: 137 mmol/L (ref 135–145)
Total Bilirubin: 1.9 mg/dL — ABNORMAL HIGH (ref 0.3–1.2)
Total Bilirubin: 1.2 mg/dL (ref 0.3–1.2)
Total Protein: 8.6 g/dL — ABNORMAL HIGH (ref 6.5–8.1)
Total Protein: 7.7 g/dL (ref 6.5–8.1)

## 2021-06-14 LAB — URINE CULTURE: Culture: >=100000 — AB

## 2021-06-14 LAB — CBC
HCT: 49.2 % — ABNORMAL HIGH (ref 36.0–46.0)
Hemoglobin: 16.6 g/dL — ABNORMAL HIGH (ref 12.0–15.0)
MCH: 31.7 pg (ref 26.0–34.0)
MCHC: 33.7 g/dL (ref 30.0–36.0)
MCV: 94.1 fL (ref 80.0–100.0)
Platelets: 224 10*3/uL (ref 150–400)
RBC: 5.23 MIL/uL — ABNORMAL HIGH (ref 3.87–5.11)
RDW: 12.1 % (ref 11.5–15.5)
WBC: 18.8 10*3/uL — ABNORMAL HIGH (ref 4.0–10.5)
nRBC: 0 % (ref 0.0–0.2)

## 2021-06-14 LAB — GLUCOSE, CAPILLARY
Glucose-Capillary: 130 mg/dL — ABNORMAL HIGH (ref 70–99)
Glucose-Capillary: 137 mg/dL — ABNORMAL HIGH (ref 70–99)
Glucose-Capillary: 148 mg/dL — ABNORMAL HIGH (ref 70–99)
Glucose-Capillary: 243 mg/dL — ABNORMAL HIGH (ref 70–99)
Glucose-Capillary: 262 mg/dL — ABNORMAL HIGH (ref 70–99)

## 2021-06-14 LAB — VITAMIN D 25 HYDROXY (VIT D DEFICIENCY, FRACTURES): Vit D, 25-Hydroxy: 91.45 ng/mL (ref 30–100)

## 2021-06-14 MED ORDER — CALCITONIN (SALMON) 200 UNIT/ML IJ SOLN
4.0000 [IU]/kg | Freq: Two times a day (BID) | INTRAMUSCULAR | Status: AC
  Administered 2021-06-14 – 2021-06-15 (×4): 254 [IU] via INTRAMUSCULAR
  Filled 2021-06-14 (×4): qty 1.27

## 2021-06-14 MED ORDER — ERYTHROMYCIN ETHYLSUCCINATE 200 MG/5ML PO SUSR
400.0000 mg | Freq: Three times a day (TID) | ORAL | Status: DC
  Administered 2021-06-14 – 2021-06-15 (×4): 400 mg via ORAL
  Filled 2021-06-14 (×6): qty 10

## 2021-06-14 MED ORDER — CHLORHEXIDINE GLUCONATE CLOTH 2 % EX PADS
6.0000 | MEDICATED_PAD | Freq: Every day | CUTANEOUS | Status: DC
  Administered 2021-06-14 – 2021-06-18 (×5): 6 via TOPICAL

## 2021-06-14 MED ORDER — ONDANSETRON HCL 4 MG/2ML IJ SOLN
4.0000 mg | Freq: Four times a day (QID) | INTRAMUSCULAR | Status: DC | PRN
  Administered 2021-06-14 (×3): 4 mg via INTRAVENOUS
  Filled 2021-06-14 (×4): qty 2

## 2021-06-14 MED ORDER — ONDANSETRON HCL 4 MG/2ML IJ SOLN
4.0000 mg | Freq: Once | INTRAMUSCULAR | Status: AC
  Administered 2021-06-14: 4 mg via INTRAVENOUS
  Filled 2021-06-14: qty 2

## 2021-06-14 MED ORDER — SODIUM CHLORIDE 0.9 % IV SOLN: Freq: Once | INTRAVENOUS | Status: AC

## 2021-06-14 MED ORDER — SODIUM CHLORIDE 0.9 % IV SOLN: INTRAVENOUS | Status: DC

## 2021-06-14 MED ORDER — AMLODIPINE BESYLATE 10 MG PO TABS
10.0000 mg | ORAL_TABLET | Freq: Every day | ORAL | Status: DC
  Administered 2021-06-14 – 2021-06-18 (×5): 10 mg via ORAL
  Filled 2021-06-14 (×5): qty 1

## 2021-06-14 MED ORDER — FAMOTIDINE 20 MG PO TABS
20.0000 mg | ORAL_TABLET | Freq: Two times a day (BID) | ORAL | Status: DC
  Administered 2021-06-14 (×2): 20 mg via ORAL
  Filled 2021-06-14 (×2): qty 1

## 2021-06-14 MED ORDER — LABETALOL HCL 5 MG/ML IV SOLN
5.0000 mg | INTRAVENOUS | Status: DC | PRN
  Administered 2021-06-14 – 2021-06-16 (×5): 5 mg via INTRAVENOUS
  Filled 2021-06-14 (×5): qty 4

## 2021-06-14 MED ORDER — SODIUM CHLORIDE 0.9 % IV SOLN
1.0000 g | INTRAVENOUS | Status: DC
  Administered 2021-06-14 – 2021-06-18 (×5): 1 g via INTRAVENOUS
  Filled 2021-06-14 (×2): qty 1
  Filled 2021-06-14 (×3): qty 10
  Filled 2021-06-14: qty 1
  Filled 2021-06-14: qty 10

## 2021-06-14 MED ORDER — SENNOSIDES-DOCUSATE SODIUM 8.6-50 MG PO TABS: 1.0000 | ORAL_TABLET | Freq: Every evening | ORAL | Status: DC | PRN

## 2021-06-14 MED ORDER — LORAZEPAM 2 MG/ML IJ SOLN: 1.0000 mg | Freq: Once | INTRAMUSCULAR | Status: AC

## 2021-06-14 MED ORDER — LORAZEPAM 2 MG/ML IJ SOLN
INTRAMUSCULAR | Status: AC
  Administered 2021-06-14: 1 mg via INTRAVENOUS
  Filled 2021-06-14: qty 1

## 2021-06-14 MED ORDER — PANTOPRAZOLE SODIUM 20 MG PO TBEC
20.0000 mg | DELAYED_RELEASE_TABLET | Freq: Every day | ORAL | Status: DC
  Administered 2021-06-14 – 2021-06-18 (×5): 20 mg via ORAL
  Filled 2021-06-14 (×5): qty 1

## 2021-06-14 NOTE — Progress Notes (Signed)
Patient continue to c/o nausea and appears restless. Pt had 1 small green emesis with food particles noted. She also c/o left hip pain 10/10. I asked her if she has fallen at home prior, she statesd that she did not fall but then voice that she fell yesterday but not today. B/P elevated. MD notified. Order for nausea received.  PIV also noted infiltrated. PT is difficult stick. IV team consulted.

## 2021-06-14 NOTE — Progress Notes (Addendum)
Request for MRI regarding the patient's PPM, noting the he system is not MRI conditional Dr. Rayann Heman discussed case with neurology and felt the the MRI is the best way to get the information they need Dr. Rayann Heman reviewed pacemaker system and last transmission She is not pacer dependent though  has high pacing percentages. Discussed with [facility] rep as well.  Dr. Rayann Heman felt reasonable to proceed given the necessity of the MRI scan. Dr. Rayann Heman discussed with the patient that her PPM system is not MRI conditional though he could not guarantee that there would be no complication with her PPM  felt the risk of PPM complication was low. The patient was agreeable to proceed with MRI  [employer] industry representative will interrogate the devic pre-post MRI and manage programming with EP direction.  D/w MRI nursing manager and they are aware and nursing monitoring will be done throughout the scan as usual.  Tommye Standard, PA-C    I have seen, examined the patient, and reviewed the above assessment and plan.  Changes to above are made where necessary.  I have spoken with Dr Leonel Ramsay who feels that MRI is crucial to diagnosis and management of possible spinal injury. The patient is well known to me.  She is not pacemaker dependant.  Her device is functioning normally.  Given the concern for a significant spinal cord issue which could have very serious consequences if not further evaluated, I think that it is prudent to proceed with MRI.  This would be "off label" given that her PPM is not a MRI conditional system.  Dr Leonel Ramsay is concerned about using cardiac CT as an alternative due to her very severe contrast reaction previously.  I had a long discussion with the patient regarding risks of MRI and her pacemaker.  There have been multiple studies which support safe MRI in patients with similar devices.  MRI in patients with non compatible pacing systems has become routine and standard of care in many  institutions.  Risks of the procedure including pacemaker system failure, damage to the device/ leads, injury to the heart, arrhythmia, and death were discussed.  The patient understands risks and wishes to proceed at this time.  We will arrange for [employer] industry reps to be available with the MRI team for this procedure.  Thompson Grayer MD, [place] 06/14/2021

## 2021-06-14 NOTE — Evaluation (Signed)
Physical Therapy Evaluation Patient Details Name: Melissa Lopez MRN: [id number] DOB: May 11, 1951 Today's Date: 06/14/2021   History of Present Illness  The pt is a 70 yo female presenting 7/5 with low back pain and reports of multiple falls (9 over course of weekend) with possible seizure activity. PMH includes: CKD III, CAD, HTN, DM II, pacemaker placement, CHF, and primary hyperparathyroidism   Clinical Impression  Pt in bed upon arrival of PT, agreeable to evaluation at this time. Prior to admission the pt was mobilizing with use of RW and assist from her daughter in the home, but with use of WC while attending pace program during the day. However, per the pt's chart, she has recently had large increase in frequency of falls. The pt now presents with limitations in functional mobility, strength, power, dynamic stability, endurance, and safety awareness due to above dx, and will continue to benefit from skilled PT to address these deficits. The pt was able to complete multiple sit-stand transfers and pivot transfers with minA of 1-2 for safety and use of RW for BUE support. The pt demos poor strength and power in BLE, and requires assist to maintain stability at this time. The pt will continue to benefit from skilled PT acutely as well as in home setting following d/c.      Follow Up Recommendations Home health PT;Supervision for mobility/OOB    Equipment Recommendations  None recommended by PT (pt well equipped)    Recommendations for Other Services       Precautions / Restrictions Precautions Precautions: Fall Precaution Comments: significant history of falls Restrictions Weight Bearing Restrictions: No      Mobility  Bed Mobility Overal bed mobility: Needs Assistance Bed Mobility: Rolling;Sidelying to Sit;Sit to Supine Rolling: Supervision Sidelying to sit: Supervision   Sit to supine: Supervision   General bed mobility comments: supervision with heavy use of bed rails to  pull to sit. no overt LOB from sitting, pt able to scoot to EOB    Transfers Overall transfer level: Needs assistance Equipment used: Rolling walker (2 wheeled);1 person hand held assist Transfers: Sit to/from [facility] Sit to Stand: Min assist;+2 safety/equipment Stand pivot transfers: Min assist;+2 safety/equipment       General transfer comment: minA to power up either with HHA or with RW for BUE support. The pt needs increased time to power up, cues for hand positioning, and had episode of incontinence with first stand. sit-stand and stand pivot completed x5 in session  Ambulation/Gait Ambulation/Gait assistance: Min assist;+2 safety/equipment Gait Distance (Feet): 5 Feet Assistive device: Rolling walker (2 wheeled) Gait Pattern/deviations: Step-to pattern;Decreased stride length;Shuffle;Trunk flexed;Narrow base of support Gait velocity: decreased Gait velocity interpretation: <1.31 ft/sec, indicative of household ambulator General Gait Details: small steps with minimal clearance, minA to manage RW and steady. no episodes of shaking or LOB during this session  Stairs            Wheelchair Mobility    Modified Rankin (Stroke Patients Only)       Balance Overall balance assessment: Needs assistance Sitting-balance support: Feet supported Sitting balance-Leahy Scale: Fair     Standing balance support: Bilateral upper extremity supported;During functional activity Standing balance-Leahy Scale: Poor Standing balance comment: reliant on UE support                             Pertinent Vitals/Pain Pain Assessment: Faces Faces Pain Scale: Hurts even more Pain Location: L hip Pain  Descriptors / Indicators: Sore;Restless;Discomfort Pain Intervention(s): Limited activity within patient's tolerance;Monitored during session;Repositioned    Home Living Family/patient expects to be discharged to:: Private residence Living Arrangements:  Children;Other relatives Available Help at Discharge: Family;Available 24 hours/day Type of Home: House Home Access: Stairs to enter Entrance Stairs-Rails: Right Entrance Stairs-Number of Steps: 2 Home Layout: Multi-level;Able to live on main level with bedroom/bathroom Home Equipment: Grab bars - tub/shower;Shower seat;Walker - 2 wheels;Cane - single point;Wheelchair - manual Additional Comments: info from prior charting, pt states her WC is from pace, has a RW for use at home    Prior Function Level of Independence: Needs assistance   Gait / Transfers Assistance Needed: use of RW for gait at home, assistance from daughter for mobility.  ADL's / Homemaking Assistance Needed: pt states daughter assists with dressing, bathing. pt attends pace during the day. presume daughter assists with IADLs  Comments: pt unreliable historian at this time     Hand Dominance   Dominant Hand: Right    Extremity/Trunk Assessment   Upper Extremity Assessment Upper Extremity Assessment: Defer to OT evaluation    Lower Extremity Assessment Lower Extremity Assessment: Generalized weakness    Cervical / Trunk Assessment Cervical / Trunk Assessment: Kyphotic  Communication   Communication: No difficulties  Cognition Arousal/Alertness: Awake/alert Behavior During Therapy: Restless;Flat affect Overall Cognitive Status: Impaired/Different from baseline Area of Impairment: Orientation;Attention;Memory;Following commands;Safety/judgement;Awareness;Problem solving                 Orientation Level: Disoriented to;Time;Situation (pt unable to name date/month, unable to name what brought her to hospital) Current Attention Level: Focused Memory: Decreased short-term memory Following Commands: Follows one step commands inconsistently;Follows one step commands with increased time Safety/Judgement: Decreased awareness of deficits;Decreased awareness of safety   Problem Solving: Slow  processing;Decreased initiation;Requires verbal cues General Comments: pt with increased time for processing, needing cues for technique/sequencing at times as well as to maintain attention to task. The pt demos decreased awareness, (looking for button to call for RN on IV pole despite stating it is on the call bell). pt needing max cues for safety      General Comments General comments (skin integrity, edema, etc.): HR to 135 bpm with gait, SpO2 99% on RA. BP 156/123 (135) supine in bed prior to session; 144/112 (122) post standing/pivot; 162/127 (139) sitting in recliner at end of session. RN alerted    Exercises     Assessment/Plan    PT Assessment Patient needs continued PT services  PT Problem List Decreased strength;Decreased range of motion;Decreased activity tolerance;Decreased balance;Decreased mobility;Decreased coordination;Decreased cognition;Decreased safety awareness       PT Treatment Interventions DME instruction;Gait training;Stair training;Functional mobility training;Therapeutic activities;Balance training;Therapeutic exercise;Patient/family education    PT Goals (Current goals can be found in the Care Plan section)  Acute Rehab PT Goals Patient Stated Goal: to lay down in bed PT Goal Formulation: With patient Time For Goal Achievement: 06/28/21 Potential to Achieve Goals: Fair    Frequency Min 4X/week   Barriers to discharge        Co-evaluation PT/OT/SLP Co-Evaluation/Treatment: Yes Reason for Co-Treatment: For patient/therapist safety;Necessary to address cognition/behavior during functional activity;To address functional/ADL transfers PT goals addressed during session: Mobility/safety with mobility;Balance;Proper use of DME         AM-PAC PT "6 Clicks" Mobility  Outcome Measure Help needed turning from your back to your side while in a flat bed without using bedrails?: A Little Help needed moving from lying on your back to  sitting on the side of a  flat bed without using bedrails?: A Little Help needed moving to and from a bed to a chair (including a wheelchair)?: A Little Help needed standing up from a chair using your arms (e.g., wheelchair or bedside chair)?: A Little Help needed to walk in hospital room?: A Little Help needed climbing 3-5 steps with a railing? : A Lot 6 Click Score: 17    End of Session Equipment Utilized During Treatment: Gait belt Activity Tolerance: Patient limited by fatigue;Patient tolerated treatment well Patient left: in bed;with call bell/phone within reach;with bed alarm set Nurse Communication: Mobility status PT Visit Diagnosis: Other abnormalities of gait and mobility (R26.89);Repeated falls (R29.6);Muscle weakness (generalized) (M62.81)    Time: 1127-1201 PT Time Calculation (min) (ACUTE ONLY): 34 min   Charges:   PT Evaluation $PT Eval Moderate Complexity: 1 Mod          Karma Ganja, PT, DPT   Acute Rehabilitation Department Pager #: [phone]  Otho Bellows 06/14/2021, 12:25 PM

## 2021-06-14 NOTE — Progress Notes (Signed)
Patient c/o nausea with 1 small ep of greenish ememsis, B/P still elevated. Pt still c/o the urge to have a BM but unable to. MD notified. Dr. Howie Ill at bedside to assess.

## 2021-06-14 NOTE — Progress Notes (Signed)
Occupational Therapy Evaluation  PTA pt lives at home with daughter who assists as needed with mobility and ADL tasks @ RW level. Pt with multiple falls PTA. During session, pt complained of feeling dizzy. See BP below. Recommend direct assistance with ADL and mobility on DC with HHOT follow up. Will follow acutely.     06/14/21 1400  OT Visit Information  Last OT Received On 06/14/21  Assistance Needed +1  PT/OT/SLP Co-Evaluation/Treatment Yes  Reason for Co-Treatment For patient/therapist safety  OT goals addressed during session ADL's and self-care  History of Present Illness The pt is a 70 yo female presenting 7/5 with low back pain and reports of multiple falls (9 over course of weekend) with possible seizure activity. PMH includes: CKD III, CAD, HTN, DM II, pacemaker placement, CHF, and primary hyperparathyroidism  Precautions  Precautions Fall  Precaution Comments significant history of falls  Home Living  Family/patient expects to be discharged to: Private residence  Living Arrangements Children;Other relatives  Available Help at Discharge Family;Available 24 hours/day  Type of Home House  Home Access Stairs to enter  Entrance Stairs-Number of Steps 2  Entrance Stairs-Rails Right  Home Layout Multi-level;Able to live on main level with bedroom/bathroom  Bathroom Shower/Tub Tub/shower unit;Curtain  Corporate treasurer Yes  Home Equipment Grab bars - tub/shower;Shower seat;Walker - 2 wheels;Cane - single point;Wheelchair - manual  Additional Comments info from prior charting, pt states her WC is from pace, has a RW for use at home  Prior Function  Level of Independence Needs assistance  Gait / Transfers Assistance Needed use of RW for gait at home, assistance from daughter for mobility.  ADL's / Homemaking Assistance Needed pt states daughter assists with dressing, bathing. pt attends pace during the day. presume daughter assists with IADLs   Comments pt unreliable historian at this time; per chart takes bus [facility]  Communication  Communication No difficulties  Pain Assessment  Pain Assessment Faces  Faces Pain Scale 6  Pain Location L hip  Pain Descriptors / Indicators Sore;Restless;Discomfort  Pain Intervention(s) Limited activity within patient's tolerance  Cognition  Arousal/Alertness Awake/alert  Behavior During Therapy Restless;Flat affect  Overall Cognitive Status Impaired/Different from baseline  Area of Impairment Orientation;Attention;Memory;Following commands;Safety/judgement;Awareness;Problem solving  Orientation Level Disoriented to;Time;Situation (pt unable to name date/month, unable to name what brought her to hospital)  Current Attention Level Focused  Memory Decreased short-term memory  Following Commands Follows one step commands inconsistently;Follows one step commands with increased time  Safety/Judgement Decreased awareness of deficits;Decreased awareness of safety  Problem Solving Slow processing;Decreased initiation;Requires verbal cues  General Comments pt with increased time for processing, needing cues for technique/sequencing at times as well as to maintain attention to task. The pt demos decreased awareness, (looking for button to call for RN on IV pole despite stating it is on the call bell). pt needing max cues for safety  Upper Extremity Assessment  Upper Extremity Assessment Generalized weakness  Lower Extremity Assessment  Lower Extremity Assessment Defer to PT evaluation  Cervical / Trunk Assessment  Cervical / Trunk Assessment Kyphotic  ADL  Overall ADL's  Needs assistance/impaired  Grooming Minimal assistance;Sitting  Upper Body Bathing Minimal assistance;Sitting  Lower Body Bathing Moderate assistance;Sit to/from stand  Upper Body Dressing  Minimal assistance;Sitting  Lower Body Dressing Moderate assistance;Sit to/from Retail buyer Minimal assistance;+2 for  safety/equipment;BSC  Toileting- Clothing Manipulation and Hygiene Moderate assistance  Functional mobility during ADLs Minimal assistance  Vision- History  Baseline  Vision/History Wears glasses  Wears Glasses At all times  Patient Visual Report No change from baseline  Bed Mobility  Overal bed mobility Needs Assistance  Bed Mobility Rolling;Sidelying to Sit;Sit to Supine  Rolling Supervision  Sidelying to sit Supervision  Sit to supine Supervision  General bed mobility comments supervision with heavy use of bed rails to pull to sit. no overt LOB from sitting, pt able to scoot to EOB  Transfers  Overall transfer level Needs assistance  Equipment used Rolling walker (2 wheeled);1 person hand held assist  Transfers Sit to/from [employer] Transfers  Sit to Stand Min assist;+2 safety/equipment  Stand pivot transfers Min assist;+2 safety/equipment  General transfer comment minA to power up either with HHA or with RW for BUE support. The pt needs increased time to power up, cues for hand positioning, and had episode of incontinence with first stand. sit-stand and stand pivot completed x5 in session  Balance  Overall balance assessment Needs assistance;History of Falls  Sitting-balance support Feet supported  Sitting balance-Leahy Scale Fair  Standing balance support Bilateral upper extremity supported;During functional activity  Standing balance-Leahy Scale Poor  Standing balance comment reliant on UE support  General Comments  General comments (skin integrity, edema, etc.) HR to 135 bpm with gait, SpO2 99% on RA. BP 156/123 (135) supine in bed prior to session; 144/112 (122) post standing/pivot; 162/127 (139) sitting in recliner at end of session. RN alerted  OT - End of Session  Equipment Utilized During Treatment Gait belt;Rolling walker  Activity Tolerance Patient tolerated treatment well  Patient left in bed;with call bell/phone within reach;with bed alarm set (innitially left  in chair however pt attempting to get back to bed on her own)  Nurse Communication Mobility status  OT Assessment  OT Recommendation/Assessment Patient needs continued OT Services  OT Visit Diagnosis Unsteadiness on feet (R26.81);Repeated falls (R29.6);Muscle weakness (generalized) (M62.81);Other symptoms and signs involving cognitive function;Dizziness and giddiness (R42);Pain  Pain - Right/Left Left  Pain - part of body Hip  OT Problem List Decreased strength;Decreased activity tolerance;Impaired balance (sitting and/or standing);Decreased cognition;Decreased safety awareness;Decreased coordination;Decreased knowledge of use of DME or AE;Cardiopulmonary status limiting activity;Pain  OT Plan  OT Frequency (ACUTE ONLY) Min 2X/week  OT Treatment/Interventions (ACUTE ONLY) Self-care/ADL training;Therapeutic exercise;Neuromuscular education;Energy conservation;DME and/or AE instruction;Therapeutic activities;Cognitive remediation/compensation;Patient/family education;Balance training  AM-PAC OT "6 Clicks" Daily Activity Outcome Measure (Version 2)  Help from another person eating meals? 3  Help from another person taking care of personal grooming? 3  Help from another person toileting, which includes using toliet, bedpan, or urinal? 2  Help from another person bathing (including washing, rinsing, drying)? 2  Help from another person to put on and taking off regular upper body clothing? 3  Help from another person to put on and taking off regular lower body clothing? 2  6 Click Score 15  OT Recommendation  Follow Up Recommendations Home health OT;Supervision/Assistance - 24 hour (Direct S with all ADL adn mobility; Home Health Aide)  OT Equipment None recommended by OT (need to clarify)  Individuals Consulted  Consulted and Agree with Results and Recommendations Patient unable/family or caregiver not available  Acute Rehab OT Goals  Patient Stated Goal to lay down in bed  OT Goal Formulation  With patient  Time For Goal Achievement 06/28/21  Potential to Achieve Goals Good  OT Time Calculation  OT Start Time (ACUTE ONLY) 1127  OT Stop Time (ACUTE ONLY) 1201  OT Time Calculation (min) 34 min  OT  General Charges  $OT Visit 1 Visit  OT Evaluation  $OT Eval Moderate Complexity 1 Mod  Written Expression  Dominant Hand Right  Maurie Boettcher, OT/L   Acute OT Clinical Specialist Acute Rehabilitation Services Pager [phone] Office [phone]

## 2021-06-14 NOTE — Consult Note (Addendum)
[facility] Renal Consultation Note  Requesting MD:  Indication for Consultation: acute on chronic renal failure, Maintenance of euvolemia, assessment and treatment of acid/base abnormalities, treatment and assessment of anemia.  HPI:  Melissa Lopez is a 70 y.o. female. That has a history of chronic renal failure stage 3   baseline creatinine can be seen to be about 1.7 - 2 mg/dl    History of coronary artery disease and Hypertension, diabetes and history of primary hyperparathyroidism. She was admitted 06/12/21 with falls and possible seizure. Patient appears to have a documented history of dementia. There appears to be some relationship to orthostatic hypotension although this felt to be a less likely contributor to the falls. A complete neurological evaluation is being undertaken. The blood pressure has been quite elevated on admission and the primary service is actively in the process of adding additional medications.  Patient appears to have diastolic heart failure as well as history of pacemaker for sick sinus rhythm. In 2019 the patient was seen by nephrology -there was acute renal failure from contrast administration.  Work up for HTN  7/6 showed an increased cortisol 13.2 and TSH 1.622. She was seen by Dr Burnett Sheng in 5/20 for an evaluation of hypercalcemia and chronic renal failure  Cr 2 mg/dl   She has had hypercalcemia for > 10 years.  It looks like the PTH has been 140 in 07/27/15 with Ca 10.9  that is consistent with primary hyperparathyroidism.  BP 167/147   HR  96    T 97.6        no recorded urine output  Na 137  K 3.5 Cl 104 C02 18 Glc 265 BUN 25 Cr 2.03 Ca 14.1     Meds   Amlodipine10 mg, Aspirin 81 mg, calcitonin 254 units , carvedilol 6.25 mg bid, lexapro 20 mg daily, pepcid 20mg  daily, insulin ss, oxybutynin 10 mg, crestor 5 mg. IV Rocephin.  CT.   Multiple bilateral renal cysts with some rim calcifications  poorly evaluated on unenhanced CT. No hydro.   Atherosclerotic calcifications. Chronic ischemic microangiopathy with generalized cerebral atrophy.          Creatinine, Ser  Date/Time Value Ref Range Status  06/14/2021 09:08 AM 2.03 (H) 0.44 - 1.00 mg/dL Final  06/13/2021 05:52 PM 1.86 (H) 0.44 - 1.00 mg/dL Final  06/12/2021 03:39 PM 2.51 (H) 0.44 - 1.00 mg/dL Final  11/17/2020 01:30 AM 1.54 (H) 0.44 - 1.00 mg/dL Final  11/15/2020 01:49 AM 1.53 (H) 0.44 - 1.00 mg/dL Final  11/14/2020 01:19 AM 1.54 (H) 0.44 - 1.00 mg/dL Final  11/13/2020 03:28 AM 1.71 (H) 0.44 - 1.00 mg/dL Final  11/12/2020 02:36 PM 1.51 (H) 0.44 - 1.00 mg/dL Final  11/11/2020 01:11 AM 1.63 (H) 0.44 - 1.00 mg/dL Final  11/10/2020 12:40 AM 1.51 (H) 0.44 - 1.00 mg/dL Final  11/09/2020 10:15 PM 1.58 (H) 0.44 - 1.00 mg/dL Final  11/09/2020 12:50 PM 1.76 (H) 0.44 - 1.00 mg/dL Final  10/28/2020 06:38 PM 1.98 (H) 0.44 - 1.00 mg/dL Final  10/10/2020 03:04 PM 1.83 (H) 0.44 - 1.00 mg/dL Final  09/29/2020 04:52 AM 1.74 (H) 0.44 - 1.00 mg/dL Final  09/28/2020 05:04 AM 1.89 (H) 0.44 - 1.00 mg/dL Final  09/27/2020 05:17 AM 1.99 (H) 0.44 - 1.00 mg/dL Final  09/26/2020 05:08 AM 1.99 (H) 0.44 - 1.00 mg/dL Final  09/25/2020 05:15 AM 2.09 (H) 0.44 - 1.00 mg/dL Final  09/24/2020 06:44 AM 1.88 (H) 0.44 - 1.00 mg/dL Final  09/23/2020  11:23 AM 1.79 (H) 0.44 - 1.00 mg/dL Final  09/22/2020 12:28 PM 1.56 (H) 0.44 - 1.00 mg/dL Final  11/12/2018 03:42 AM 1.76 (H) 0.44 - 1.00 mg/dL Final  11/10/2018 06:27 AM 1.85 (H) 0.44 - 1.00 mg/dL Final  11/08/2018 03:53 AM 1.98 (H) 0.44 - 1.00 mg/dL Final  11/07/2018 03:09 AM 2.10 (H) 0.44 - 1.00 mg/dL Final  11/06/2018 07:27 AM 1.96 (H) 0.44 - 1.00 mg/dL Final  11/05/2018 03:43 AM 1.85 (H) 0.44 - 1.00 mg/dL Final  11/04/2018 04:18 AM 1.85 (H) 0.44 - 1.00 mg/dL Final  11/03/2018 04:30 AM 1.97 (H) 0.44 - 1.00 mg/dL Final  11/02/2018 03:33 AM 2.10 (H) 0.44 - 1.00 mg/dL Final  11/01/2018 05:16 AM 2.85 (H) 0.44 - 1.00 mg/dL Final  10/31/2018 03:00 PM  3.64 (H) 0.44 - 1.00 mg/dL Final  10/31/2018 01:21 PM 3.80 (H) 0.44 - 1.00 mg/dL Final  10/31/2018 12:55 PM 2.50 (H) 0.44 - 1.00 mg/dL Final  10/31/2018 12:49 PM 2.71 (H) 0.44 - 1.00 mg/dL Final     PMHx:   Past Medical History:  Diagnosis Date   CKD (chronic kidney disease) stage 3, GFR 30-59 ml/min ([place]) 10/31/2018   - per patient report   Coronary artery disease    per patient report - 2 caths with non-obstructive CAD   Essential hypertension    Frequent falls    H/O cardiac pacemaker 10/31/2018   implanted 05/2010   Sick sinus syndrome ([place])    Type 2 diabetes mellitus with complication, with long-term current use of insulin ([place]) 10/31/2018   with diabetic nephropathy    Past Surgical History:  Procedure Laterality Date   BIOPSY  09/28/2020   Procedure: BIOPSY;  Surgeon: Ronald Lobo, MD;  Location: [facility];  Service: Endoscopy;;   CARDIAC PACEMAKER PLACEMENT  2008   in [place] for sick sinus syndrome   CORONARY/GRAFT ACUTE MI REVASCULARIZATION N/A 11/09/2020   Procedure: CORONARY/GRAFT ACUTE MI REVASCULARIZATION;  Surgeon: Leonie Man, MD;  Location: [facility];  Service: Cardiovascular;  Laterality: N/A;   ESOPHAGOGASTRODUODENOSCOPY N/A 09/28/2020   Procedure: ESOPHAGOGASTRODUODENOSCOPY (EGD);  Surgeon: Ronald Lobo, MD;  Location: [facility];  Service: Endoscopy;  Laterality: N/A;   ESOPHAGOGASTRODUODENOSCOPY (EGD) WITH PROPOFOL N/A 11/10/2018   Procedure: ESOPHAGOGASTRODUODENOSCOPY (EGD) WITH PROPOFOL;  Surgeon: Wilford Corner, MD;  Location: [facility];  Service: Endoscopy;  Laterality: N/A;   LEFT HEART CATH AND CORONARY ANGIOGRAPHY N/A 10/31/2018   Procedure: LEFT HEART CATH AND CORONARY ANGIOGRAPHY;  Surgeon: Leonie Man, MD;  Location: [facility];  Service: Cardiovascular;  Laterality: N/A;   PACEMAKER GENERATOR CHANGE  09/2018   in [place]    Family Hx:  Family History  Problem Relation Age of Onset    Breast cancer Mother    Bone cancer Mother    Heart attack Father 45   Breast cancer Sister    Heart disease Brother        stents   Other Sister        MVA   Diabetes Brother     Social History:  reports that she quit smoking about 51 years ago. She has never used smokeless tobacco. She reports previous alcohol use. She reports previous drug use.  Allergies:  Allergies  Allergen Reactions   Contrast Media [Iodinated Diagnostic Agents] Swelling    Contrast Dye - the one made out of shellfish, gives a really bad reaction - swelling   Oxycodone-Acetaminophen Anaphylaxis   Xarelto  [  Rivaroxaban] Swelling   Oxycodone Other (See Comments)    Statused by Person:  EAST, CAMELIA(CE) on [phone]    Medications: Prior to Admission medications   Medication Sig Start Date End Date Taking? Authorizing Provider  acetaminophen (TYLENOL) 325 MG tablet Take 650 mg by mouth 2 (two) times daily.   Yes [provider]  aspirin 81 MG chewable tablet Chew 1 tablet (81 mg total) by mouth daily. 11/13/18  Yes Dessa Phi, DO  buPROPion [facility] SR) 150 MG 12 hr tablet Take 150 mg by mouth daily. 07/13/20  Yes [provider]  carvedilol (COREG) 6.25 MG tablet Take 6.25 mg by mouth 2 (two) times daily with a meal.   Yes [provider]  Cholecalciferol 1.25 MG (50000 UT) capsule Take 50,000 Units by mouth every Sunday.   Yes [provider]  docusate sodium (COLACE) 100 MG capsule Take 100 mg by mouth 2 (two) times daily.   Yes [provider]  escitalopram (LEXAPRO) 20 MG tablet Take 20 mg by mouth daily. 08/11/20  Yes [provider]  gabapentin (NEURONTIN) 100 MG capsule Take 100 mg by mouth 3 (three) times daily. 08/11/20  Yes [provider]  HYDROcodone-Acetaminophen 5-300 MG TABS Take 1 tablet by mouth 2 (two) times daily. 01/23/21  Yes [provider]  lisinopril (ZESTRIL) 20 MG tablet Take 1 tablet (20 mg total) by mouth  daily. 10/10/20 06/14/21 Yes Imogene Burn, PA-C  Multiple Vitamin (MULTIVITAMIN WITH MINERALS) TABS tablet Take 1 tablet by mouth daily. 09/30/20  Yes Hongalgi, Lenis Dickinson, MD  oxybutynin (DITROPAN-XL) 5 MG 24 hr tablet Take 5 mg by mouth daily.   Yes [provider]  rosuvastatin (CRESTOR) 5 MG tablet Take one tablet daily Patient taking differently: Take 5 mg by mouth at bedtime. 10/10/20  Yes Imogene Burn, PA-C  traZODone (DESYREL) 50 MG tablet Take 50 mg by mouth at bedtime.   Yes [provider]  amLODipine (NORVASC) 10 MG tablet TAKE 1 TABLET BY MOUTH EVERY DAY Patient not taking: No sig reported 10/10/20   Imogene Burn, PA-C  insulin degludec (TRESIBA FLEXTOUCH) 100 UNIT/ML FlexTouch Pen Inject 5 Units into the skin daily.    [provider]  metoCLOPramide (REGLAN) 5 MG tablet Take 1 tablet (5 mg total) by mouth 3 (three) times daily for 14 days. 11/17/20 12/01/20  Terrilee Croak, MD  Nutritional Supplements (,FEEDING SUPPLEMENT, PROSOURCE PLUS) liquid Take 30 mLs by mouth daily. 09/29/20   Hongalgi, Lenis Dickinson, MD  ondansetron (ZOFRAN) 4 MG tablet Take 1 tablet (4 mg total) by mouth every 6 (six) hours. 10/28/20   Couture, Cortni S, PA-C  polyethylene glycol (MIRALAX / GLYCOLAX) 17 g packet Take 17 g by mouth 2 (two) times daily. 09/29/20   Modena Jansky, MD      Labs:  Results for orders placed or performed during the hospital encounter of 06/12/21 (from the past 48 hour(s))  CBC with Differential     Status: Abnormal   Collection Time: 06/12/21  3:39 PM  Result Value Ref Range   WBC 14.1 (H) 4.0 - 10.5 K/uL   RBC 4.28 3.87 - 5.11 MIL/uL   Hemoglobin 13.4 12.0 - 15.0 g/dL   HCT 40.2 36.0 - 46.0 %   MCV 93.9 80.0 - 100.0 fL   MCH 31.3 26.0 - 34.0 pg   MCHC 33.3 30.0 - 36.0 g/dL   RDW 12.7 11.5 - 15.5 %   Platelets 220 150 -  400 K/uL   nRBC 0.0 0.0 - 0.2 %   Neutrophils Relative % 56 %   Neutro Abs 8.0 (H) 1.7 - 7.7 K/uL   Lymphocytes Relative  33 %   Lymphs Abs 4.6 (H) 0.7 - 4.0 K/uL   Monocytes Relative 8 %   Monocytes Absolute 1.2 (H) 0.1 - 1.0 K/uL   Eosinophils Relative 2 %   Eosinophils Absolute 0.2 0.0 - 0.5 K/uL   Basophils Relative 1 %   Basophils Absolute 0.1 0.0 - 0.1 K/uL   Immature Granulocytes 0 %   Abs Immature Granulocytes 0.03 0.00 - 0.07 K/uL    Comment: Performed at [facility] [address], [place], [place] [address]  Comprehensive metabolic panel     Status: Abnormal   Collection Time: 06/12/21  3:39 PM  Result Value Ref Range   Sodium 136 135 - 145 mmol/L   Potassium 3.9 3.5 - 5.1 mmol/L   Chloride 108 98 - 111 mmol/L   CO2 22 22 - 32 mmol/L   Glucose, Bld 101 (H) 70 - 99 mg/dL    Comment: Glucose reference range applies only to samples taken after fasting for at least 8 hours.   BUN 34 (H) 8 - 23 mg/dL   Creatinine, Ser 2.51 (H) 0.44 - 1.00 mg/dL   Calcium 11.7 (H) 8.9 - 10.3 mg/dL   Total Protein 6.8 6.5 - 8.1 g/dL   Albumin 3.6 3.5 - 5.0 g/dL   AST 13 (L) 15 - 41 U/L   ALT 12 0 - 44 U/L   Alkaline Phosphatase 65 38 - 126 U/L   Total Bilirubin 0.7 0.3 - 1.2 mg/dL   GFR, Estimated 20 (L) >60 mL/min    Comment: (NOTE) Calculated using the CKD-EPI Creatinine Equation (2021)    Anion gap 6 5 - 15    Comment: Performed at [facility], [place] [address], [place], [place] [address]  Urinalysis, Routine w reflex microscopic     Status: Abnormal   Collection Time: 06/13/21 12:44 AM  Result Value Ref Range   Color, Urine YELLOW YELLOW   APPearance HAZY (A) CLEAR   Specific Gravity, Urine 1.010 1.005 - 1.030   pH 6.0 5.0 - 8.0   Glucose, UA 50 (A) NEGATIVE mg/dL   Hgb urine dipstick NEGATIVE NEGATIVE   Bilirubin Urine NEGATIVE NEGATIVE   Ketones, ur NEGATIVE NEGATIVE mg/dL   Protein, ur NEGATIVE NEGATIVE mg/dL   Nitrite NEGATIVE NEGATIVE   Leukocytes,Ua LARGE (A) NEGATIVE   RBC / HPF 0-5 0 - 5 RBC/hpf   WBC, UA 21-50 0 - 5 WBC/hpf   Bacteria, UA RARE (A) NONE SEEN   Squamous  Epithelial / LPF 6-10 0 - 5    Comment: Performed at [facility], [address] [place], [place] [address]  Urine culture     Status: Abnormal   Collection Time: 06/13/21 12:44 AM   Specimen: Urine, Random  Result Value Ref Range   Specimen Description URINE, RANDOM    Special Requests      ADDED 0216 Performed at [facility], [place] [address], [place],  [address]    Culture (A)     >=100,000 COLONIES/mL MULTIPLE SPECIES PRESENT, SUGGEST RECOLLECTION   Report Status 06/14/2021 FINAL   Resp Panel by RT-PCR (Flu A&B, Covid) Nasopharyngeal Swab     Status: None   Collection Time: 06/13/21  2:42 AM   Specimen: Nasopharyngeal Swab; Nasopharyngeal(NP) swabs in vial transport  medium  Result Value Ref Range   SARS Coronavirus 2 by RT PCR NEGATIVE NEGATIVE    Comment: (NOTE) SARS-CoV-2 target nucleic acids are NOT DETECTED.  The SARS-CoV-2 RNA is generally detectable in upper respiratory specimens during the acute phase of infection. The lowest concentration of SARS-CoV-2 viral copies this assay can detect is 138 copies/mL. A negative result does not preclude SARS-Cov-2 infection and should not be used as the sole basis for treatment or other patient management decisions. A negative result may occur with  improper specimen collection/handling, submission of specimen other than nasopharyngeal swab, presence of viral mutation(s) within the areas targeted by this assay, and inadequate number of viral copies(<138 copies/mL). A negative result must be combined with clinical observations, patient history, and epidemiological information. The expected result is Negative.  Fact Sheet for Patients:  EntrepreneurPulse.com.au  Fact Sheet for Healthcare Providers:  IncredibleEmployment.be  This test is no t yet approved or cleared by the [place] FDA and  has been authorized for detection and/or diagnosis of SARS-CoV-2 by FDA under an  Emergency Use Authorization (EUA). This EUA will remain  in effect (meaning this test can be used) for the duration of the COVID-19 declaration under Section 564(b)(1) of the Act, 21 U.S.C.section 360bbb-3(b)(1), unless the authorization is terminated  or revoked sooner.       Influenza A by PCR NEGATIVE NEGATIVE   Influenza B by PCR NEGATIVE NEGATIVE    Comment: (NOTE) The Xpert Xpress SARS-CoV-2/FLU/RSV plus assay is intended as an aid in the diagnosis of influenza from Nasopharyngeal swab specimens and should not be used as a sole basis for treatment. Nasal washings and aspirates are unacceptable for Xpert Xpress SARS-CoV-2/FLU/RSV testing.  Fact Sheet for Patients: EntrepreneurPulse.com.au  Fact Sheet for Healthcare Providers: IncredibleEmployment.be  This test is not yet approved or cleared by the [place] FDA and has been authorized for detection and/or diagnosis of SARS-CoV-2 by FDA under an Emergency Use Authorization (EUA). This EUA will remain in effect (meaning this test can be used) for the duration of the COVID-19 declaration under Section 564(b)(1) of the Act, 21 U.S.C. section 360bbb-3(b)(1), unless the authorization is terminated or revoked.  Performed at  [facility], [place] [address], [place], [place] [address]   CK     Status: None   Collection Time: 06/13/21  5:04 AM  Result Value Ref Range   Total CK 40 38 - 234 U/L    Comment: Performed at [facility], [place] [address], [place], [place] [address]  TSH     Status: None   Collection Time: 06/13/21  7:47 AM  Result Value Ref Range   TSH 1.622 0.350 - 4.500 uIU/mL    Comment: Performed by a 3rd Generation assay with a functional sensitivity of <=0.01 uIU/mL. Performed at [facility], [place] [address], [place], [place] [address]   Cortisol-am, blood     Status: None   Collection Time: 06/13/21  7:47 AM  Result Value Ref Range   Cortisol - AM 13.2  6.7 - 22.6 ug/dL    Comment: Performed at [facility], [place] [address], [place], [place] [address]  Sodium, urine, random     Status: None   Collection Time: 06/13/21  3:53 PM  Result Value Ref Range   Sodium, Ur 134 mmol/L    Comment: Performed at [facility] [address] [place], [place] [address]  Creatinine, urine, random     Status:  None   Collection Time: 06/13/21  3:53 PM  Result Value Ref Range   Creatinine, Urine 22.84 mg/dL    Comment: Performed at [facility], [place] [address] [place], [place] [address]  Magnesium     Status: None   Collection Time: 06/13/21  5:52 PM  Result Value Ref Range   Magnesium 2.3 1.7 - 2.4 mg/dL    Comment: Performed at [facility], [place] [address], [place], [place] [address]  Comprehensive metabolic panel     Status: Abnormal   Collection Time: 06/13/21  5:52 PM  Result Value Ref Range   Sodium 140 135 - 145 mmol/L   Potassium 4.1 3.5 - 5.1 mmol/L   Chloride 110 98 - 111 mmol/L   CO2 21 (L) 22 - 32 mmol/L   Glucose, Bld 106 (H) 70 - 99 mg/dL    Comment: Glucose reference range applies only to samples taken after fasting for at least 8 hours.   BUN 21 8 - 23 mg/dL   Creatinine, Ser 1.86 (H) 0.44 - 1.00 mg/dL   Calcium 12.7 (H) 8.9 - 10.3 mg/dL   Total Protein 7.4 6.5 - 8.1 g/dL   Albumin 3.9 3.5 - 5.0 g/dL   AST 16 15 - 41 U/L   ALT 11 0 - 44 U/L   Alkaline Phosphatase 78 38 - 126 U/L   Total Bilirubin 0.8 0.3 - 1.2 mg/dL   GFR, Estimated 29 (L) >60 mL/min    Comment: (NOTE) Calculated using the CKD-EPI Creatinine Equation (2021)    Anion gap 9 5 - 15    Comment: Performed at [facility] [address] [place], [place] [address]  CBC WITH DIFFERENTIAL     Status: Abnormal   Collection Time: 06/13/21  5:52 PM  Result Value Ref Range   WBC 12.2 (H) 4.0 - 10.5 K/uL   RBC 4.73 3.87 - 5.11 MIL/uL   Hemoglobin 14.7 12.0 - 15.0 g/dL   HCT 44.0 36.0 - 46.0 %   MCV 93.0 80.0 - 100.0 fL   MCH 31.1 26.0  - 34.0 pg   MCHC 33.4 30.0 - 36.0 g/dL   RDW 12.3 11.5 - 15.5 %   Platelets 230 150 - 400 K/uL   nRBC 0.0 0.0 - 0.2 %   Neutrophils Relative % 66 %   Neutro Abs 8.1 (H) 1.7 - 7.7 K/uL   Lymphocytes Relative 25 %   Lymphs Abs 3.0 0.7 - 4.0 K/uL   Monocytes Relative 7 %   Monocytes Absolute 0.9 0.1 - 1.0 K/uL   Eosinophils Relative 1 %   Eosinophils Absolute 0.1 0.0 - 0.5 K/uL   Basophils Relative 1 %   Basophils Absolute 0.1 0.0 - 0.1 K/uL   Immature Granulocytes 0 %   Abs Immature Granulocytes 0.02 0.00 - 0.07 K/uL    Comment: Performed at [facility] [address], [place], [place] [address]  Phosphorus     Status: Abnormal   Collection Time: 06/13/21  5:52 PM  Result Value Ref Range   Phosphorus 2.2 (L) 2.5 - 4.6 mg/dL    Comment: Performed at [facility] [address] [place], [place] [address]  CBG monitoring, ED     Status: None   Collection Time: 06/13/21  6:17 PM  Result Value Ref Range   Glucose-Capillary 85 70 - 99 mg/dL    Comment: Glucose reference range applies only to samples taken after fasting for  at least 8 hours.  Glucose, capillary     Status: Abnormal   Collection Time: 06/13/21 11:52 PM  Result Value Ref Range   Glucose-Capillary 122 (H) 70 - 99 mg/dL    Comment: Glucose reference range applies only to samples taken after fasting for at least 8 hours.  CBC     Status: Abnormal   Collection Time: 06/14/21  3:53 AM  Result Value Ref Range   WBC 18.8 (H) 4.0 - 10.5 K/uL   RBC 5.23 (H) 3.87 - 5.11 MIL/uL   Hemoglobin 16.6 (H) 12.0 - 15.0 g/dL   HCT 49.2 (H) 36.0 - 46.0 %   MCV 94.1 80.0 - 100.0 fL   MCH 31.7 26.0 - 34.0 pg   MCHC 33.7 30.0 - 36.0 g/dL   RDW 12.1 11.5 - 15.5 %   Platelets 224 150 - 400 K/uL   nRBC 0.0 0.0 - 0.2 %    Comment: Performed at [facility], [place] [address], [place], [place] [address]  Glucose, capillary     Status: Abnormal   Collection Time: 06/14/21  8:34 AM  Result Value Ref Range    Glucose-Capillary 262 (H) 70 - 99 mg/dL    Comment: Glucose reference range applies only to samples taken after fasting for at least 8 hours.   Comment 1 Notify RN    Comment 2 Document in Chart   Comprehensive metabolic panel     Status: Abnormal   Collection Time: 06/14/21  9:08 AM  Result Value Ref Range   Sodium 137 135 - 145 mmol/L   Potassium 3.5 3.5 - 5.1 mmol/L   Chloride 104 98 - 111 mmol/L   CO2 18 (L) 22 - 32 mmol/L   Glucose, Bld 265 (H) 70 - 99 mg/dL    Comment: Glucose reference range applies only to samples taken after fasting for at least 8 hours.   BUN 25 (H) 8 - 23 mg/dL   Creatinine, Ser 2.03 (H) 0.44 - 1.00 mg/dL   Calcium 14.1 (HH) 8.9 - 10.3 mg/dL    Comment: CRITICAL RESULT CALLED TO, READ BACK BY AND VERIFIED WITH: L.RUDDY RN 1059 06/14/21 MCCORMICK K    Total Protein 8.6 (H) 6.5 - 8.1 g/dL   Albumin 4.5 3.5 - 5.0 g/dL   AST 40 15 - 41 U/L   ALT 29 0 - 44 U/L   Alkaline Phosphatase 88 38 - 126 U/L   Total Bilirubin 1.9 (H) 0.3 - 1.2 mg/dL   GFR, Estimated 26 (L) >60 mL/min    Comment: (NOTE) Calculated using the CKD-EPI Creatinine Equation (2021)    Anion gap 15 5 - 15    Comment: Performed at [facility], [place] [address] [place], [place] [address]  Glucose, capillary     Status: Abnormal   Collection Time: 06/14/21 11:23 AM  Result Value Ref Range   Glucose-Capillary 243 (H) 70 - 99 mg/dL    Comment: Glucose reference range applies only to samples taken after fasting for at least 8 hours.   Comment 1 Notify RN    Comment 2 Document in Chart      ROS: Negative except for HPI    Physical Exam: Vitals:   06/14/21 0734 06/14/21 1126  BP: (!) 190/115 (!) 209/135  Pulse: 90 (!) 113  Resp: 19 17  Temp: 97.6 F (36.4 C) 97.6 F (36.4 C)  SpO2: 96% 93%     General: non distressed appears comfortable at rest HEENT:  NCAT  mucus membranes moist Eyes: EOMI   Neck:  JVP not elevated  supple  no thyromegaly no adenopathy Heart: tachycardia  with no rubs or gallops Lungs: clear no wheezes or rales Abdomen: soft non distended  normal bowel sounds Extremities: no cyanosis or clubbing no edema Skin: no lesion or rashes Neuro: grossly intact  some disorientation  Assessment/Plan: 1.Renal-  renal function appears to vary but she is not that much changed from baseline. She has multiple cysts.  there is a renal ultrasound in 2019 that show fairly generous sized kidney but not excessive. This could be polycystic kidney disease and will order an ultrasound to compare. The urine sediment is a little cloudy and this could be infected. She is currently receiving antibiotics.  There is possibly calcifications in cysts that may be a consequence of her primary hyperparathyroidism 2. Hypertension/volume  - This is a difficult question to answer and try to assist. She has resistant hypertension and has been taking 4 antihypertensive medications for many years. However there is the question of orthostasis and falls. This is concerning. Her meds are now being added back by her primary physician and would need careful titration with attention to orthostatic measurements.  There is asymmetry to the kidneys and this could be consistent with renal vascular disease certainly unilateral renal artery stenosis. I shall repeat the ultrasound but look at doppers to evaluate flow.  3. Hypercalcemia  a PTH was increased in 2016  this is consistent with primary hyperparathyroidism continue micacalcin and iv fluids at this point.   Sherril Croon 06/14/2021, 2:10 PM

## 2021-06-14 NOTE — Progress Notes (Addendum)
Subjective:   ON: Patient has several episodes of non bloody emesis and nausea overnight. Patient was given IV Zofran which reportedly helped. Patient had Bps elevated to 782N systolic. Patient reportedly complained of abdominal pain and constipation but abdomen soft and non distended on exam.  Patient seen on morning rounds. Patient endorses feeling terrible. She endorses nausea and small amount of emesis this morning. She denies abdominal pain, dysuria. She reports an episode of shaking while she has been hospitalized but was unable to tell us when the event occurred or details of the event. She states she does not notice the orofacial dyskinetic movements and it does not bother her.  1100 Patient with improvement in nausea without further emesis.   Objective:  Vital signs in last 24 hours: Vitals:   06/14/21 0337 06/14/21 0338 06/14/21 0402 06/14/21 0734  BP: (!) 161/123 (!) 191/117 (!) 168/68 (!) 190/115  Pulse: 77 76  90  Resp: (!) 25 (!) 22  19  Temp: 97.7 F (36.5 C)   97.6 F (36.4 C)  TempSrc: Oral   Oral  SpO2: 99% 99%  96%  Weight:      Height:        Intake/Output Summary (Last 24 hours) at 06/14/2021 0949 Last data filed at 06/14/2021 0700 Gross per 24 hour  Intake 1356.13 ml  Output --  Net 1356.13 ml    CBC Latest Ref Rng & Units 06/14/2021 06/13/2021 06/12/2021  WBC 4.0 - 10.5 K/uL 18.8(H) 12.2(H) 14.1(H)  Hemoglobin 12.0 - 15.0 g/dL 16.6(H) 14.7 13.4  Hematocrit 36.0 - 46.0 % 49.2(H) 44.0 40.2  Platelets 150 - 400 K/uL 224 230 220    BMP Latest Ref Rng & Units 06/13/2021 06/12/2021 11/17/2020  Glucose 70 - 99 mg/dL 106(H) 101(H) 125(H)  BUN 8 - 23 mg/dL 21 34(H) 10  Creatinine 0.44 - 1.00 mg/dL 1.86(H) 2.51(H) 1.54(H)  Sodium 135 - 145 mmol/L 140 136 135  Potassium 3.5 - 5.1 mmol/L 4.1 3.9 3.2(L)  Chloride 98 - 111 mmol/L 110 108 105  CO2 22 - 32 mmol/L 21(L) 22 21(L)  Calcium 8.9 - 10.3 mg/dL 12.7(H) 11.7(H) 11.2(H)   Phosphorus [[id number]] (Abnormal)  Collected: 06/13/21 1752  Specimen: Blood Updated: 06/13/21 1831   Phosphorus 2.2 Low  mg/dL    Magnesium [[id number]] Collected: 06/13/21 1752  Specimen: Blood Updated: 06/13/21 1831   Magnesium 2.3 mg/dL    Urinalysis, Routine w reflex microscopic [[id number]] (Abnormal) Collected: 06/13/21 0044  Specimen: Urine Updated: 06/13/21 0106   Color, Urine YELLOW   APPearance HAZY Abnormal    Specific Gravity, Urine 1.010   pH 6.0   Glucose, UA 50 Abnormal  mg/dL    Hgb urine dipstick NEGATIVE   Bilirubin Urine NEGATIVE   Ketones, ur NEGATIVE mg/dL    Protein, ur NEGATIVE mg/dL    Nitrite NEGATIVE   Leukocytes,Ua LARGE Abnormal    RBC / HPF 0-5 RBC/hpf    WBC, UA 21-50 WBC/hpf    Bacteria, UA RARE Abnormal    Squamous Epithelial / LPF 6-10   Sodium, urine, random [[id number]] Collected: 06/13/21 1553  Specimen: Urine, Clean Catch Updated: 06/13/21 1918   Sodium, Ur 134 mmol/L   Creatinine, urine, random [[id number]] Collected: 06/13/21 1553  Specimen: Urine, Clean Catch Updated: 06/13/21 1918   Creatinine, Urine 22.84 mg/dL    Urine culture [[id number]] (Abnormal) Collected: 06/13/21 0044  Specimen: Urine, Random Updated: 06/14/21 0802   Specimen Description URINE, RANDOM   Special Requests --   ADDED  [id number]  Performed at [facility], [place] [address] [place], [place] [address]    Culture >=100,000 COLONIES/mL MULTIPLE SPECIES PRESENT, SUGGEST RECOLLECTION Abnormal    Report Status 06/14/2021 FINAL   Resp Panel by RT-PCR (Flu A&B, Covid) Nasopharyngeal Swab [[id number]] Collected: 06/13/21 0242  Specimen: Nasopharyngeal(NP) swabs in vial transport medium from Nasopharyngeal Swab Updated: 06/13/21 0341   SARS Coronavirus 2 by RT PCR NEGATIVE   Influenza A by PCR NEGATIVE   Influenza B by PCR NEGATIVE   TSH [[id number]] Collected: 06/13/21 0747  Specimen: Blood Updated: 06/13/21 0855   TSH 1.622 uIU/mL    Cortisol-am, blood [[id number]] Collected: 06/13/21 0747  Specimen:  Blood Updated: 06/13/21 0925   Cortisol - AM 13.2 ug/dL    EEG Study 7/6: IMPRESSION: This study is suggestive of mild diffuse encephalopathy, nonspecific etiology. No seizures or epileptiform discharges were seen throughout the recording. [name]   CT ABDOMEN PELVIS WO CONTRAST  Result Date: 06/13/2021 CLINICAL DATA:  Trauma/fall, back pain EXAM: CT CHEST, ABDOMEN AND PELVIS WITHOUT CONTRAST TECHNIQUE:IMPRESSION: No evidence of traumatic injury to the chest, abdomen, or pelvis. Status post cholecystectomy. Dilated common duct, measuring 17 mm, although smoothly tapering at the ampulla, likely postsurgical. Numerous bilateral renal cysts of varying sizes and complexity, with scattered peripheral calcifications, poorly evaluated unenhanced CT but grossly unchanged. Dedicated lumbar spine CT was imaged and will be reported separately. Electronically Signed   By: Julian Hy M.D.   On: 06/13/2021 03:41   CT Head Wo Contrast  Result Date: 06/13/2021 CLINICAL DATA:  Head trauma.  Fall. EXAM: CT HEAD WITHOUT CONTRAST CT MAXILLOFACIAL WITHOUT CONTRAST CT CERVICAL SPINE WITHOUT CONTRAST TECHNIQUE: IMPRESSION: 1. Chronic ischemic microangiopathy and generalized atrophy without acute intracranial abnormality. 2. No facial fracture. 3. No acute fracture or static subluxation of the cervical spine. Electronically Signed   By: Ulyses Jarred M.D.   On: 06/13/2021 03:45   CT Chest Wo Contrast  Result Date: 06/13/2021 CLINICAL DATA:  Trauma/fall, back pain EXAM: CT CHEST, ABDOMEN AND PELVIS WITHOUT CONTRAST TECHNIQUE: IMPRESSION: No evidence of traumatic injury to the chest, abdomen, or pelvis. Status post cholecystectomy. Dilated common duct, measuring 17 mm, although smoothly tapering at the ampulla, likely postsurgical. Numerous bilateral renal cysts of varying sizes and complexity, with scattered peripheral calcifications, poorly evaluated unenhanced CT but grossly unchanged. Dedicated lumbar spine  CT was imaged and will be reported separately. Electronically Signed   By: Julian Hy M.D.   On: 06/13/2021 03:41   CT CERVICAL SPINE WO CONTRAST  Result Date: 06/13/2021 CLINICAL DATA:  Head trauma.  Fall. EXAM: CT HEAD WITHOUT CONTRAST CT MAXILLOFACIAL WITHOUT CONTRAST CT CERVICAL SPINE WITHOUT CONTRAST TECHNIQUE: IMPRESSION: 1. Chronic ischemic microangiopathy and generalized atrophy without acute intracranial abnormality. 2. No facial fracture. 3. No acute fracture or static subluxation of the cervical spine. Electronically Signed   By: Ulyses Jarred M.D.   On: 06/13/2021 03:45   CT L-SPINE NO CHARGE  Result Date: 06/13/2021 CLINICAL DATA:  Fall EXAM: CT LUMBAR SPINE WITHOUT CONTRAST TECHNIQUE: IMPRESSION: 1. No acute fracture or static subluxation of the lumbar spine. 2. L3-4 moderate spinal canal stenosis and moderate right and mild left neural foraminal stenosis. 3. Severe bilateral L5-S1 neural foraminal stenosis. Electronically Signed   By: Ulyses Jarred M.D.   On: 06/13/2021 03:38   ECHOCARDIOGRAM COMPLETE  Result Date: 06/13/2021 IMPRESSIONS  1. 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 Left ventricular ejection fraction, by  estimation, is 50 to 55%. The left ventricle has low normal function. The left ventricle has no regional wall motion abnormalities. There is severe eccentric left ventricular hypertrophy of the apical segment. Left ventricular diastolic parameters are indeterminate.  2. Right ventricular systolic function is normal. The right ventricular size is normal. There is normal pulmonary artery systolic pressure.  3. A small pericardial effusion is present. There is no evidence of cardiac tamponade.  4. The mitral valve is grossly normal. Trivial mitral valve regurgitation.  5. The aortic valve has an indeterminant number of cusps. There is moderate calcification of the aortic valve. There is mild thickening of the aortic valve. Aortic valve  regurgitation is not visualized. Mild aortic valve sclerosis is present, with no evidence of aortic valve stenosis.  6. Aortic dilatation noted. There is mild dilatation of the ascending aorta, measuring 39 mm.  7. The inferior vena cava is normal in size with greater than 50% respiratory variability, suggesting right atrial pressure of 3 mmHg. Comparison(s): Prior images reviewed side by side. Changes from prior study are noted. 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 EF low normal, potentially worse at apex but cannot be fully evaluated due to hypertrophy. Visually appears similar to prior. If device is MRI compatible, would consider cardiac MRI for further evaluation. No apical LV gradients measured on this study, but gradients cann be excluded as a potential etiology of syncope based on current images. Buford Dresser MD Electronically signed by Buford Dresser MD Signature Date/Time: 06/13/2021/6:55:31 PM    Final    DG HIP UNILAT WITH PELVIS 2-3 VIEWS LEFT  Result Date: 06/12/2021 CLINICAL DATA:  Fall this morning. Left lateral hip and left hip joint pain. EXAM: DG HIP (WITH OR WITHOUT PELVIS) 2-3V LEFT COMPARISON:  X-ray pelvis 05/24/2021. IMPRESSION: Negative for acute traumatic injury. Electronically Signed   By: Iven Finn M.D.   On: 06/12/2021 18:30   CT Maxillofacial Wo Contrast  Result Date: 06/13/2021 CLINICAL DATA:  Head trauma.  Fall. EXAM: CT HEAD WITHOUT CONTRAST CT MAXILLOFACIAL WITHOUT CONTRAST CT CERVICAL SPINE WITHOUT CONTRAST TECHNIQUE:IMPRESSION: 1. Chronic ischemic microangiopathy and generalized atrophy without acute intracranial abnormality. 2. No facial fracture. 3. No acute fracture or static subluxation of the cervical spine. Electronically Signed   By: Ulyses Jarred M.D.   On: 06/13/2021 03:45     KUB 7/7: FINDINGS: The bowel gas pattern is normal. No radio-opaque calculi  or other significant radiographic abnormality are seen. IMPRESSION: Negative exam.  By: Inge Rise M.D    Physical Exam: General: Appears uncomfortable, elderly appearing white female HENT: normocephalic, atraumatic EYES: tracks examiner, EOM intact all cardinal directions, no scleral icterus CV: tachycardic, normal rhythm, no murmurs, rubs, gallops. When patient was sat up in bed she began to tremble and stated feeling very unwell. Pulmonary: increased work of breathing, sating well on RA, lung clear to auscultation, no rales, wheezes, rhonchi Abdominal: non-distended, soft, tenderness to palpation supra pubic area, normal BS Neurological: Patient is awake, alert, oriented to self and place but not time (did not know year). Patient noted to have postural tremor. No resting tremor, no action tremor. Reflexes: brisk 3+ reflexes brachioradialis, patellar, achilles, with several beats of clonus on L foot. Negative babinski bilaterally  Assessment/Plan:  Active Problems:   Syncope   Seizure-like activity [facility])  Ms. Daliyah Sramek is a 70 yo female with PMHx of CKD stage 3, nonobstructive CAD (cath 11/2020), HTN, SSS s/p  pacemaker, hypercalcemia, primary hyperparathyroidism, T2DM with diabetic nephropathy and gastroparesis, who presented to [facility] with shaking and falls. Patient was found to have AKI, hypercalcemia, and possible UTI. Patient was admitted for further workup and evaluation of shaking spells.  #Seizure-like activity vs Presyncope/syncope # hx of frequent falls #hyperreflexia on exam Presents with signs and symptoms concerning for syncope versus seizure-like activity.  Has significant risk factors for syncope including multiple centrally acting medications, diabetes with possible autonomic dysregulation, and multiple antihypertensive medications which could be contributing to her underlying presentation. Patient does have a significant history of sick sinus syndrome status post  pacemaker therefore structural heart disease  or arrhythmia could potentially be causing her symptoms. Recent CT scan without contrast without evidence of acute ischemic infarct or ICH. EEG was performed on admission without any evidence of subclinical seizures. Neurology feels these shaking episodes are likely due to underlying medical condition/dehydration and not a neurological condition. We have tried to obtain MRI Brain and Spine but patient's pacemaker leads are not compatible, would need to turn off leads temporarily. Patient has allergy to contrast used in myelograms (40 years ago patient was hospitalized after contrast and spent time in ICU, family could not remember exact symptoms of allergy) and therefore myelograms of spine could not be completed. Orthostatics were positive for hypotension with drop in BP systolic greater than 20 (140->98). Given the patients T5TD complications: gastroparesis, nephropathy, urge urinary incontinence, the patient's hypotensive symptoms are likely due to ANS dysfunction 2/2 T2DM. Patient has also been vomiting with fluid losses which could contribute to hypotensive symptoms. Patient was receiving 61mL/hr LR overnight 7/6. Patient has possible UTI infection given large leukocytes and rare bacteria on UA with suprapubic pain which could contribute to hypotensive symptoms.  Spoke with representative for pacemaker from [place]. Discussed case and representative went over possible risks of procedure including possible but less likely risk of lead dislodgement, arrhythmias, leads getting too hot. Representative Mickel Baas) will be able to interrogate device and be present during imaging procedure. Spoke with radiology (Dr. Armandina Gemma) who stated they would proceed with MRI imaging if representative could be present for procedure. Radiology will try to minimize patient exposure. Neurology (Dr. Leonel Ramsay) would like MRI spine cervical and thoracic as well as MRI brain due  to concerns for CNS lesion given hyperreflexia on exam and ANS symptoms. Risks and benefits were discussed with patient's daughter, Laniya Friedl. Daughter feels that it will be worth the risk to assess brain/spine for causes of patient's symptoms. Her priority is to determine the cause of seizure-like episodes. -Neurology consulted appreciate recs -Plan to order MRI Brain and MRI Spine (Cervical and thoracic)  -maintenance fluids 250cc bolus  -treating UTI as below -repeat orthostatics after fluid replacement   #HTN Patient has a significant history of hypertension on multiple blood pressure medications including amlodipine 10 mg, carvedilol 6.25 mg twice daily, hydralazine 25 mg nightly, lisinopril 20 mg daily.  Patient's daughter states that the amlodipine and the hydralazine were recently discontinued due to concern for orthostatic hypotension. Patient's BP is elevated to 322G-254Y systolic. Patient will need fluids for hypercalcemia and to replace GI losses though with elevated BP, we are monitoring carefully. -coreg 6.25mg  -amlodipine 10mg  -PRN labetalol with parameters   #Hypercalcemia Ca 14.1 with history of primary hyperparathyroidism and hx of hypercalcemia. Per daughter patient's normal calcium is 10-11. She states when patient is vomiting the calcium will increase as she becomes dehydrated. She has seen surgery for here primary hyperparathyroidism  several years ago in [place] and no surgery was warranted at that time.  -250cc bolus IV fluids -recheck Ca afternoon 7/7 -calcitonin -vitamin D level -PTH level -consider surgery consult for parathyroidectomy if Ca continues to increase or patient becomes symptomatic.    #Acute on Chronic Kidney Disease (Stage IIIB #diabetic nephropathy Patient presents with acute elevation in creatinine to 2.51 from a baseline of 1.5 and a GFR of 20 from a baseline of 36.  She appears to have baseline CKD stage IIIb likely secondary to  underlying diabetes and hypertension. Random urine sodium and creatinine are normal.  CT abdomen pelvis did not show any acute urinary issues, she does however have multiple bilateral renal cysts and varying size and complexity is which may need further evaluation.  She has received 1 L of IV fluids in the ED and received maintenance fluids. Repeat CMP showed improvement in Cr to 1.86 with fluids. Cr back up to 2.03. Treating for UTI as above.  -Consult nephrology for concern for possible renal complications from primary hyperparathyroidism, appreciate recs  -250cc bolus IV fluids -Strict ins and outs to trend urine output -Avoid nephrotoxic medications -Daily kidney function and electrolytes  #UTI #leukocytosis #urge urinary incontinence Patient presented with mild leukocytosis of 14 on admission. WBC increased to 18.8 on 7/7. Patient was given a dose of ceftriaxone in the ED due to concern for possible urinary tract infection. UA with large leukocytes and rare bacteria. Patient with supra pubic pain 7/7. Urine culture came back with multiple specimens. Patient has history of overactive bladder and is on oxybutynin at home. -urine culture repeat clean catch  -ceftriaxone for 10 days (end date 7/15) -oxybutynin for overactive bladder   #Nausea/Vomiting Patient developed nausea and vomiting in the ED. Per daughter, patient often has n/v at home and it occurs when she has not eaten. At home patient will take pepcid with improvement in symptoms. Nausea and vomiting improved on pepcid. Pateint expressed concerns about feeling constipated despite two large Bms overnight and soft non distended abdomen. KUB with normal bowel gas pattern, no radio-opaque calculi. Patient's high calcium of 14 may be contributing to n/v. Patient also has UTI which may be contributing as well. -Pepcid 20mg  BID with prn Zofran 4mg  -250cc bolus IV fluids   #Sick sinus syndrome Patient has sick sinus syndrome status post  pacemaker.  Recent interrogation was performed in the past several months without any problems.    #T2DM Patient has a history of type 2 diabetes on Tresiba 5 units daily.  Her last hemoglobin A1c was 6.5.  She does however have significant complications associated with her diabetes including diabetic nephropathy and gastroparesis.  She was recently taken off of Reglan due to concerns for possible changes in her mentation -sliding scale insulin and CBG monitoring  #Prior CVA R Thalamic lacunar stroke Residual left sided numbness per most recent Neurology note (Dr. Andrey Spearman). Patient had previous fall, hitting her left forehead and eye per his note.She has had several falls last few months as well as 60-80 pound weight loss.CTH showed moderate chronic microvascular ischemic change. Old right thalamic lacunar infarct. Significant left periorbital hemorrhage/swelling. No injury to the left globe or postseptal orbit. No fractures. No acute intracranial abnormalities.  -continue aspirin 81mg   #CAD nonobstructive #HLD Patient has a history of hyperlipidemia CAD. Patient had EKG findings several years back concerning for NSTEMI. Cardiac catheterization was done and showed nonobstructive CAD. Patient is currently on rosuvastatin 5 mg daily. -Continue rosuvastatin  5 mg daily  #HFpEF: Patient has a history of heart failure with preserved ejection fraction.  Her last echo was performed in 11/27/2020 and showed an EF of 60 to 65% with grade 1 diastolic dysfunction.  Patient appears volume down on exam.  Furthermore she is not on any diuretic therapy at home  Diet: Heart Healthy VTE: Heparin IVF: none Code: Full  Prior to Admission Living Arrangement: Home, lives with daughter who provides support Anticipated Discharge Location: Home vs SNF Barriers to Discharge: continuing diagnostic workup of shaking spells, hyperreflexia, treating hypercalcemia and AKI Dispo: Anticipated discharge in  approximately 2-3 day(s).   Wayland Denis, MD 06/14/2021, 9:25 AM Pager: [phone] After 5pm on weekdays and 1pm on weekends: On Call pager [phone]

## 2021-06-14 NOTE — Progress Notes (Signed)
Patient denied any pain or nausea at this time and voices that she feels like she has to poop but unable to despite 2 BMs. Patient then has one large BM with RN assistant. B/P still elevated (please see doc flow) pos. D/t constant moving in bed. Manual b/p check of 168/86. Attending MD notified of findings.

## 2021-06-14 NOTE — Progress Notes (Signed)
Subjective: No further episodes  Exam: Vitals:   06/14/21 0402 06/14/21 0734  BP: (!) 168/68 (!) 190/115  Pulse:  90  Resp:  19  Temp:  97.6 F (36.4 C)  SpO2:  96%   Gen: In bed, NAD Resp: non-labored breathing, no acute distress Abd: soft, nt  Neuro: MS: awake, alert, not oriented CN:VFF, EOMI Motor: She has mild proximal greater than distal weakness Sensory: Endorses intact sensation DTR:3+ throughout  Impression: 70 year old female with recurrent episodes of stiffening/LOC.  I suspect that these episodes are related to orthostasis rather than seizure.  I am concerned about her progressively worsening gait, urinary difficulties, in the setting of hyperreflexia and I do think that cervical spine imaging is merited.  After discussion with radiology and internal medicine team, the decision has been made to proceed with MRI of the cervical and thoracic spine.  Recommendations: 1) MRI cervical and thoracic spine 2) neurology will continue to follow  Roland Rack, MD [facility] [phone]  If 7pm- 7am, please page neurology on call as listed in [place].

## 2021-06-15 ENCOUNTER — Inpatient Hospital Stay (HOSPITAL_COMMUNITY): Payer: Medicare (Managed Care)

## 2021-06-15 DIAGNOSIS — D631 Anemia in chronic kidney disease: Secondary | ICD-10-CM

## 2021-06-15 DIAGNOSIS — G259 Extrapyramidal and movement disorder, unspecified: Secondary | ICD-10-CM

## 2021-06-15 DIAGNOSIS — E210 Primary hyperparathyroidism: Secondary | ICD-10-CM

## 2021-06-15 DIAGNOSIS — N183 Chronic kidney disease, stage 3 unspecified: Secondary | ICD-10-CM

## 2021-06-15 LAB — GLUCOSE, CAPILLARY
Glucose-Capillary: 149 mg/dL — ABNORMAL HIGH (ref 70–99)
Glucose-Capillary: 131 mg/dL — ABNORMAL HIGH (ref 70–99)
Glucose-Capillary: 143 mg/dL — ABNORMAL HIGH (ref 70–99)
Glucose-Capillary: 114 mg/dL — ABNORMAL HIGH (ref 70–99)

## 2021-06-15 LAB — COMPREHENSIVE METABOLIC PANEL
ALT: 22 U/L (ref 0–44)
AST: 31 U/L (ref 15–41)
Albumin: 3.8 g/dL (ref 3.5–5.0)
Alkaline Phosphatase: 74 U/L (ref 38–126)
Anion gap: 10 (ref 5–15)
BUN: 25 mg/dL — ABNORMAL HIGH (ref 8–23)
CO2: 18 mmol/L — ABNORMAL LOW (ref 22–32)
Calcium: 11.8 mg/dL — ABNORMAL HIGH (ref 8.9–10.3)
Chloride: 111 mmol/L (ref 98–111)
Creatinine, Ser: 1.65 mg/dL — ABNORMAL HIGH (ref 0.44–1.00)
GFR, Estimated: 33 mL/min — ABNORMAL LOW (ref >60)
Glucose, Bld: 144 mg/dL — ABNORMAL HIGH (ref 70–99)
Potassium: 3.6 mmol/L (ref 3.5–5.1)
Sodium: 139 mmol/L (ref 135–145)
Total Bilirubin: 1.3 mg/dL — ABNORMAL HIGH (ref 0.3–1.2)
Total Protein: 6.8 g/dL (ref 6.5–8.1)

## 2021-06-15 LAB — TECHNOLOGIST SMEAR REVIEW

## 2021-06-15 LAB — CBC
HCT: 40.2 % (ref 36.0–46.0)
Hemoglobin: 14.4 g/dL (ref 12.0–15.0)
MCH: 31.5 pg (ref 26.0–34.0)
MCHC: 35.8 g/dL (ref 30.0–36.0)
MCV: 88 fL (ref 80.0–100.0)
Platelets: 189 10*3/uL (ref 150–400)
RBC: 4.57 MIL/uL (ref 3.87–5.11)
RDW: 12.7 % (ref 11.5–15.5)
WBC: 27.2 10*3/uL — ABNORMAL HIGH (ref 4.0–10.5)
nRBC: 0 % (ref 0.0–0.2)

## 2021-06-15 LAB — URINE CULTURE: Culture: NO GROWTH

## 2021-06-15 LAB — PARATHYROID HORMONE, INTACT (NO CA): PTH: 100 pg/mL — ABNORMAL HIGH (ref 15–65)

## 2021-06-15 MED ORDER — CINACALCET HCL 30 MG PO TABS
30.0000 mg | ORAL_TABLET | Freq: Two times a day (BID) | ORAL | Status: DC
  Administered 2021-06-15 – 2021-06-18 (×6): 30 mg via ORAL
  Filled 2021-06-15 (×7): qty 1

## 2021-06-15 MED ORDER — SODIUM CHLORIDE 0.9 % IV SOLN
8.0000 mg | Freq: Once | INTRAVENOUS | Status: AC
  Administered 2021-06-15: 8 mg via INTRAVENOUS
  Filled 2021-06-15: qty 4

## 2021-06-15 MED ORDER — TRAZODONE HCL 50 MG PO TABS
50.0000 mg | ORAL_TABLET | Freq: Every day | ORAL | Status: DC
  Administered 2021-06-15 – 2021-06-17 (×3): 50 mg via ORAL
  Filled 2021-06-15 (×3): qty 1

## 2021-06-15 MED ORDER — LACTATED RINGERS IV BOLUS
1000.0000 mL | Freq: Once | INTRAVENOUS | Status: AC
  Administered 2021-06-15: 1000 mL via INTRAVENOUS

## 2021-06-15 MED ORDER — ONDANSETRON HCL 4 MG/2ML IJ SOLN
4.0000 mg | Freq: Four times a day (QID) | INTRAMUSCULAR | Status: DC | PRN
  Administered 2021-06-16 – 2021-06-18 (×3): 4 mg via INTRAVENOUS
  Filled 2021-06-15 (×4): qty 2

## 2021-06-15 MED ORDER — FAMOTIDINE 20 MG PO TABS
10.0000 mg | ORAL_TABLET | Freq: Every day | ORAL | Status: DC
  Administered 2021-06-15: 10 mg via ORAL
  Filled 2021-06-15: qty 1

## 2021-06-15 MED ORDER — GABAPENTIN 100 MG PO CAPS
100.0000 mg | ORAL_CAPSULE | Freq: Three times a day (TID) | ORAL | Status: DC
  Administered 2021-06-15 – 2021-06-18 (×9): 100 mg via ORAL
  Filled 2021-06-15 (×10): qty 1

## 2021-06-15 MED ORDER — SODIUM CHLORIDE 0.9 % IV SOLN: INTRAVENOUS | Status: AC

## 2021-06-15 NOTE — Progress Notes (Signed)
Subjective: MRI is negative.   Exam: Vitals:   06/15/21 0728 06/15/21 1137  BP: (!) 179/105 (!) 153/103  Pulse: 98 98  Resp: 19 20  Temp: 98.5 F (36.9 C) 98 F (36.7 C)  SpO2: 96% 98%   Gen: In bed, NAD Resp: non-labored breathing, no acute distress Abd: soft, nt  Neuro: MS: awake, alert, not oriented CN:VFF, EOMI Motor: She has mild proximal greater than distal weakness Sensory: Endorses intact sensation DTR:3+ throughout, she is actually brisker today with some faciculations.   Impression: 70 year old female with recurrent episodes of stiffening/LOC.  I suspect that these episodes are related to orthostasis rather than seizure, certainly her hypercalcemia is likely playing a role as well. With negative imaging, I feel that her symptoms are explained by her metabolic derangements at this time and I would focus on treating these.   Recommendations: 1) Management of hypercalcemia per IM 2) Neurology will be available as needed.    Roland Rack, MD [facility] [phone]  If 7pm- 7am, please page neurology on call as listed in [place].

## 2021-06-15 NOTE — Progress Notes (Signed)
[facility] Progress Note    Assessment/ Plan:   AKI on CKD3b, improved -underlying CKD likely related to h/o AKI, hypercalcemia, HTN, and DM. 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 Baseline Cr seems to hover around 2 -c/w IVF -Avoid nephrotoxic medications including NSAIDs and iodinated intravenous contrast exposure unless the latter is absolutely indicated.  Preferred narcotic agents for pain control are hydromorphone, fentanyl, and methadone. Morphine should not be used. Avoid Baclofen and avoid oral sodium phosphate and magnesium citrate based laxatives / bowel preps. Continue strict Input and Output monitoring. Will monitor the patient closely with you and intervene or adjust therapy as indicated by changes in clinical status/labs   Hypercalcemia, h/o primary hyperparathyroidism -c/w IVF and calcitonin. PTH drawn 7/7-pending. Calcium improving -will check a PTHrP for completion of work up -will add cinacalcet 30mg  BID, will take about 2-4 days to work and should be efficacious during the duration of treatment. Overall, will need a parathyroidectomy  Syncope, orthostatic hypotension -autonomic dysfunction 2/2 DM but was noted to have hyperreflexia on admit -per neuro and primary. Monitor orthostats regularly, on BB  H/o HTN -on amlodipine and coreg. Orthostatic hypotension as above so will need to carefully adjust meds  DM2 with hyperglycemia -mgmt per primary service  Gean Quint, MD [facility]  Subjective:   Had an episode fo n/v which resolved. No complaitns otherwise/acute events. Ca down to 11.8 today   Objective:   BP (!) 179/105 (BP Location: Left Arm)   Pulse 98   Temp 98.5 F (36.9 C) (Oral)   Resp 19   Ht 5\' 5"  (1.651 m)   Wt 63.5 kg   SpO2 96%   BMI 23.30 kg/m   Intake/Output Summary (Last 24 hours) at 06/15/2021  1055 Last data filed at 06/15/2021 0440 Gross per 24 hour  Intake 3086.73 ml  Output 1655 ml  Net 1431.73 ml   Weight change:   Physical Exam: Gen:nad SWH:[id number] dry mucosal membranes Resp:s1s2, rrr Abd:cta bl Ext:no edema Neuro: globally weak, slow to respond, moves all ext spontaneously  Imaging: DG Abd 1 View  Result Date: 06/14/2021 CLINICAL DATA:  Intractable nausea and vomiting. EXAM: ABDOMEN - 1 VIEW COMPARISON:  None. FINDINGS: The bowel gas pattern is normal. No radio-opaque calculi or other significant radiographic abnormality are seen. IMPRESSION: Negative exam. Electronically Signed   By: Inge Rise M.D.   On: 06/14/2021 09:12   MR CERVICAL SPINE WO CONTRAST  Result Date: 06/14/2021 CLINICAL DATA:  Cord compression. EXAM: MRI CERVICAL AND THORACIC SPINE WITHOUT CONTRAST TECHNIQUE: Multiecho pulse sequences of the cervical spine, to include the craniocervical junction and cervicothoracic junction, and the thoracic spine, were obtained without intravenous contrast. COMPARISON:  Cervical spine CT 06/13/2021.  Chest CT 06/13/2021. FINDINGS: Abbreviated examinations were performed consisting of sagittal T2 imaging of the cervical and thoracic spine to limit the scan time given the patient's pacemaker. Cervical spine sequences are mildly motion degraded despite multiple attempts at repeat imaging. There is trace anterolisthesis of C7 on T1. Thoracic vertebral alignment is normal. Within limitations of motion artifact, the cervical spinal cord appears normal in signal and morphology. The thoracic spinal cord is also normal in signal and morphology, and the conus medullaris terminates at T12-L1. Detailed assessment of degenerative changes is limited on this abbreviated examination. There is severe disc space narrowing at C5-6 and mild-to-moderate narrowing at C6-7  with associated degenerative endplate changes. A broad-based posterior disc osteophyte complex at C5-6 results in likely  mild spinal stenosis without evidence of cord compression. There is mild disc bulging at C6-7 without evidence of significant spinal stenosis. Disc bulging and uncovertebral spurring result in suspected mild-to-moderate right and potentially severe left neural foraminal stenosis at C5-6 as well as suspected mild right and moderate left neural foraminal stenosis at C6-7. In the thoracic spine, there is mild disc bulging in the mid and lower thoracic spine which is greatest at T8-9 without associated spinal stenosis or cord compression. There is moderate asymmetric left-sided facet arthrosis in the mid to lower thoracic spine with mild left neural foraminal stenosis at T8-9 and T9-10. IMPRESSION: 1. Abbreviated, sagittal only examination. 2. No evidence of cord compression or definite cord signal abnormality in the cervical or thoracic spine. 3. Cervical and thoracic disc degeneration most notable at C5-6 where there is mild spinal stenosis and asymmetrically severe left neural foraminal stenosis. 4. No thoracic spinal stenosis. Electronically Signed   By: Logan Bores M.D.   On: 06/14/2021 17:22   MR THORACIC SPINE WO CONTRAST  Result Date: 06/14/2021 CLINICAL DATA:  Cord compression. EXAM: MRI CERVICAL AND THORACIC SPINE WITHOUT CONTRAST TECHNIQUE: Multiecho pulse sequences of the cervical spine, to include the craniocervical junction and cervicothoracic junction, and the thoracic spine, were obtained without intravenous contrast. COMPARISON:  Cervical spine CT 06/13/2021.  Chest CT 06/13/2021. FINDINGS: Abbreviated examinations were performed consisting of sagittal T2 imaging of the cervical and thoracic spine to limit the scan time given the patient's pacemaker. Cervical spine sequences are mildly motion degraded despite multiple attempts at repeat imaging. There is trace anterolisthesis of C7 on T1. Thoracic vertebral alignment is normal. Within limitations of motion artifact, the cervical spinal cord appears  normal in signal and morphology. The thoracic spinal cord is also normal in signal and morphology, and the conus medullaris terminates at T12-L1. Detailed assessment of degenerative changes is limited on this abbreviated examination. There is severe disc space narrowing at C5-6 and mild-to-moderate narrowing at C6-7 with associated degenerative endplate changes. A broad-based posterior disc osteophyte complex at C5-6 results in likely mild spinal stenosis without evidence of cord compression. There is mild disc bulging at C6-7 without evidence of significant spinal stenosis. Disc bulging and uncovertebral spurring result in suspected mild-to-moderate right and potentially severe left neural foraminal stenosis at C5-6 as well as suspected mild right and moderate left neural foraminal stenosis at C6-7. In the thoracic spine, there is mild disc bulging in the mid and lower thoracic spine which is greatest at T8-9 without associated spinal stenosis or cord compression. There is moderate asymmetric left-sided facet arthrosis in the mid to lower thoracic spine with mild left neural foraminal stenosis at T8-9 and T9-10. IMPRESSION: 1. Abbreviated, sagittal only examination. 2. No evidence of cord compression or definite cord signal abnormality in the cervical or thoracic spine. 3. Cervical and thoracic disc degeneration most notable at C5-6 where there is mild spinal stenosis and asymmetrically severe left neural foraminal stenosis. 4. No thoracic spinal stenosis. Electronically Signed   By: Logan Bores M.D.   On: 06/14/2021 17:22   ECHOCARDIOGRAM COMPLETE  Result Date: 06/13/2021    ECHOCARDIOGRAM REPORT   Patient Name:   SHAI RASMUSSEN Date of Exam: 06/13/2021 Medical Rec #:  [id number]        Height:       65.0 in Accession #:    [id number]  Weight:       140.0 lb Date of Birth:  1951/04/15        BSA:          1.700 m Patient Age:    70 years         BP:           162/95 mmHg Patient Gender: F                 HR:           64 bpm. Exam Location:  Inpatient Procedure: 2D Echo Indications:    syncope  History:        Patient has prior history of Echocardiogram examinations, most                 recent 11/10/2020. Hypertrophic Cardiomyopathy, CAD, Pacemaker,                 chronic kidney disease; Risk Factors:Hypertension and Diabetes.  Sonographer:    Johny Chess Referring Phys: [id number] [place]  1. 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 Left ventricular ejection fraction, by estimation, is 50 to 55%. The left ventricle has low normal function. The left ventricle has no regional wall motion abnormalities. There is severe eccentric left ventricular hypertrophy of the apical segment. Left ventricular diastolic parameters are indeterminate.  2. Right ventricular systolic function is normal. The right ventricular size is normal. There is normal pulmonary artery systolic pressure.  3. A small pericardial effusion is present. There is no evidence of cardiac tamponade.  4. The mitral valve is grossly normal. Trivial mitral valve regurgitation.  5. The aortic valve has an indeterminant number of cusps. There is moderate calcification of the aortic valve. There is mild thickening of the aortic valve. Aortic valve regurgitation is not visualized. Mild aortic valve sclerosis is present, with no evidence of aortic valve stenosis.  6. Aortic dilatation noted. There is mild dilatation of the ascending aorta, measuring 39 mm.  7. The inferior vena cava is normal in size with greater than 50% respiratory variability, suggesting right atrial pressure of 3 mmHg. Comparison(s): Prior images reviewed side by side. Changes from prior study are noted. 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 EF low normal, potentially worse at apex but cannot be fully evaluated due to hypertrophy. Visually  appears similar to prior. If device is MRI compatible, would consider cardiac MRI for further evaluation. No apical LV gradients measured on this study, but gradients cann be excluded as a potential etiology of syncope based on current images. 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 Left ventricular ejection fraction, by estimation, is 50 to 55%. The left ventricle has low normal function. The left ventricle has no regional wall motion abnormalities. The left ventricular internal cavity size was small. There is severe eccentric left ventricular hypertrophy of the apical segment. Left ventricular diastolic parameters are indeterminate. Right Ventricle: The right ventricular size is normal. Right vetricular wall thickness was not well visualized. Right ventricular systolic function is normal. There is normal pulmonary artery systolic pressure. The tricuspid regurgitant velocity is 2.06 m/s, and with an assumed right atrial pressure of 3 mmHg, the estimated right ventricular systolic pressure is 38.2 mmHg. Left Atrium: Left atrial size was normal in size. Right Atrium: Right atrial size was normal in size. Pericardium: A small pericardial effusion is  present. There is no evidence of cardiac tamponade. Mitral Valve: The mitral valve is grossly normal. Trivial mitral valve regurgitation. Tricuspid Valve: The tricuspid valve is normal in structure. Tricuspid valve regurgitation is mild . No evidence of tricuspid stenosis. Aortic Valve: The aortic valve has an indeterminant number of cusps. There is moderate calcification of the aortic valve. There is mild thickening of the aortic valve. There is mild aortic valve annular calcification. Aortic valve regurgitation is not visualized. Mild aortic valve sclerosis is present, with no evidence of aortic valve stenosis. Pulmonic Valve: The pulmonic valve was not well visualized. Pulmonic valve regurgitation is not  visualized. Aorta: Aortic dilatation noted. There is mild dilatation of the ascending aorta, measuring 39 mm. Venous: The inferior vena cava is normal in size with greater than 50% respiratory variability, suggesting right atrial pressure of 3 mmHg. IAS/Shunts: The atrial septum is grossly normal. Additional Comments: A device lead is visualized.  LEFT VENTRICLE PLAX 2D LVIDd:         4.10 cm  Diastology LVIDs:         3.30 cm  LV e' medial:    5.87 cm/s LV PW:         1.60 cm  LV E/e' medial:  8.0 LV IVS:        1.60 cm  LV e' lateral:   5.44 cm/s LVOT diam:     1.70 cm  LV E/e' lateral: 8.6 LV SV:         46 LV SV Index:   27 LVOT Area:     2.27 cm  RIGHT VENTRICLE            IVC RV S prime:     9.79 cm/s  IVC diam: 1.40 cm LEFT ATRIUM             Index       RIGHT ATRIUM          Index LA diam:        3.10 cm 1.82 cm/m  RA Area:     8.78 cm LA Vol (A2C):   32.0 ml 18.82 ml/m RA Volume:   16.40 ml 9.65 ml/m LA Vol (A4C):   26.9 ml 15.82 ml/m LA Biplane Vol: 30.0 ml 17.65 ml/m  AORTIC VALVE LVOT Vmax:   108.00 cm/s LVOT Vmean:  67.700 cm/s LVOT VTI:    0.204 m  AORTA Ao Root diam: 2.90 cm Ao Asc diam:  3.90 cm MV E velocity: 46.70 cm/s   TRICUSPID VALVE MV A velocity: 117.00 cm/s  TR Peak grad:   17.0 mmHg MV E/A ratio:  0.40         TR Vmax:        206.00 cm/s                              SHUNTS                             Systemic VTI:  0.20 m                             Systemic Diam: 1.70 cm Buford Dresser MD Electronically signed by Buford Dresser MD Signature Date/Time: 06/13/2021/6:55:31 PM    Final    VAS US RENAL ARTERY DUPLEX  Result Date: 06/15/2021 ABDOMINAL VISCERAL Patient Name:  SAPNA  Kirwan  Date of Exam:   06/15/2021 Medical Rec #: [id number]         Accession #:    [id number] Date of Birth: [birth year]         Patient Gender: F Patient Age:   7Y Exam Location:  [facility] Procedure:      VAS US RENAL ARTERY DUPLEX Referring Phys: [id number] MARTIN WEBB  -------------------------------------------------------------------------------- Indications: Hypertension, anemia, chronic renal failure stage 3 High Risk Factors: Diabetes, past history of smoking, coronary artery disease. Limitations: Air/bowel gas. Comparison Study: No prior study Performing Technologist: Maudry Mayhew MHA, RDMS, RVT, RDCS  Examination Guidelines: A complete evaluation includes B-mode imaging, spectral Doppler, color Doppler, and power Doppler as needed of all accessible portions of each vessel. Bilateral testing is considered an integral part of a complete examination. Limited examinations for reoccurring indications may be performed as noted.  Duplex Findings: +------------------+--------+--------+------------------------+----------------+ Mesenteric        PSV cm/sEDV cm/s         Plaque             Comments     +------------------+--------+--------+------------------------+----------------+ Aorta Prox           52            Plaque vs thrombus in      3.0cm AP                                       area of aneurysm/ectasis    diameter     +------------------+--------+--------+------------------------+----------------+ Celiac Artery       174                                                    Origin                                                                     +------------------+--------+--------+------------------------+----------------+ SMA Proximal         74      20                                            +------------------+--------+--------+------------------------+----------------+ IMA                  61                                                    +------------------+--------+--------+------------------------+----------------+    +------------------+--------+--------+-------+ Right Renal ArteryPSV cm/sEDV cm/sComment +------------------+--------+--------+-------+ Origin               63      18            +------------------+--------+--------+-------+ Proximal             56  20           +------------------+--------+--------+-------+ Mid                  52      15           +------------------+--------+--------+-------+ Distal               42      15           +------------------+--------+--------+-------+ +-----------------+--------+--------+-------+ Left Renal ArteryPSV cm/sEDV cm/sComment +-----------------+--------+--------+-------+ Origin             127      40           +-----------------+--------+--------+-------+ Proximal           152      40           +-----------------+--------+--------+-------+ Mid                140      41           +-----------------+--------+--------+-------+ Distal             134      32           +-----------------+--------+--------+-------+ +------------+--------+--------+----+-----------+--------+--------+----+ Right KidneyPSV cm/sEDV cm/sRI  Left KidneyPSV cm/sEDV cm/sRI   +------------+--------+--------+----+-----------+--------+--------+----+ Upper Pole  15      5       0.68Upper Pole 22      7       0.70 +------------+--------+--------+----+-----------+--------+--------+----+ Mid         27      9       0.68Mid        22      8       0.62 +------------+--------+--------+----+-----------+--------+--------+----+ Lower Pole  13      6       0.55Lower Pole 19      8       0.59 +------------+--------+--------+----+-----------+--------+--------+----+ Hilar       17      7       0.62Hilar      25      8       0.69 +------------+--------+--------+----+-----------+--------+--------+----+ +------------------+---------+------------------+---------+ Right Kidney               Left Kidney                 +------------------+---------+------------------+---------+ RAR                        RAR                         +------------------+---------+------------------+---------+ RAR  (manual)      1.2      RAR (manual)      2.9       +------------------+---------+------------------+---------+ Cortex            19/7 cm/sCortex            20/8 cm/s +------------------+---------+------------------+---------+ Cortex thickness           Corex thickness             +------------------+---------+------------------+---------+ Kidney length (cm)10.80    Kidney length (cm)13.20     +------------------+---------+------------------+---------+  Summary: Largest Aortic Diameter: 3.0 cm in the proximal segment with plaque vs thrombus, suggestive of ectasis, borderline aneurysmal.  Renal:  Right: No evidence of right renal artery stenosis. RRV flow present.  Multiple cysts noted, largest measuring 4.3cm. Echogenic area        of the lower pole noted, suggestive of possible calculus. Left:  1-59% stenosis of the left renal artery. LRV flow present.        Multiple cysts noted, largest measuring 2.9cm. Mesenteric: Normal Celiac artery , Superior Mesenteric artery and Inferior Mesenteric artery findings.  *See table(s) above for measurements and observations.     Preliminary     Labs: BMET Recent Labs  Lab 06/12/21 1539 06/13/21 1752 06/14/21 0908 06/14/21 1422 06/15/21 0037  NA 136 140 137 137 139  K 3.9 4.1 3.5 3.5 3.6  CL 108 110 104 108 111  CO2 22 21* 18* 17* 18*  GLUCOSE 101* 106* 265* 179* 144*  BUN 34* 21 25* 27* 25*  CREATININE 2.51* 1.86* 2.03* 1.89* 1.65*  CALCIUM 11.7* 12.7* 14.1* 13.2* 11.8*  PHOS  --  2.2*  --   --   --    CBC Recent Labs  Lab 06/12/21 1539 06/13/21 1752 06/14/21 0353 06/15/21 0438  WBC 14.1* 12.2* 18.8* 27.2*  NEUTROABS 8.0* 8.1*  --   --   HGB 13.4 14.7 16.6* 14.4  HCT 40.2 44.0 49.2* 40.2  MCV 93.9 93.0 94.1 88.0  PLT 220 230 224 189    Medications:     amLODipine  10 mg Oral Daily   aspirin  81 mg Oral Daily   calcitonin  4 Units/kg Intramuscular BID   carvedilol  6.25 mg Oral BID WC   Chlorhexidine Gluconate  Cloth  6 each Topical Daily   enoxaparin (LOVENOX) injection  30 mg Subcutaneous Q24H   erythromycin ethylsuccinate  400 mg Oral Q8H   escitalopram  20 mg Oral Daily   famotidine  10 mg Oral Daily   insulin aspart  0-9 Units Subcutaneous TID WC   oxybutynin  10 mg Oral Daily   pantoprazole  20 mg Oral Daily   rosuvastatin  5 mg Oral Daily      Gean Quint, MD [facility] 06/15/2021, 10:55 AM

## 2021-06-15 NOTE — Progress Notes (Signed)
Urine leaking around the 14 FR foley catheter, notified MD, orders received to replace foley catheter with larger size.  Will restart 24 hour urine collection.

## 2021-06-15 NOTE — Progress Notes (Signed)
Renal artery duplex completed. Refer to "CV Proc" under chart review to view preliminary results.  06/15/2021 10:41 AM Kelby Aline., MHA, RVT, RDCS, RDMS

## 2021-06-15 NOTE — Progress Notes (Signed)
Physical Therapy Treatment Patient Details Name: Melissa Lopez MRN: [id number] DOB: 1951-10-01 Today's Date: 06/15/2021    History of Present Illness The pt is a 70 yo female presenting 7/5 with low back pain and reports of multiple falls (9 over course of weekend) with possible seizure activity. PMH includes: CKD III, CAD, HTN, DM II, pacemaker placement, CHF, and primary hyperparathyroidism    PT Comments    The pt was eager to mobilize this session, but reports continued limitations due to pain in her L hip with all mobility. The pt was able to complete multiple sit-stand transfers with minA, VC for hand placement and RW to steady. The pt completed 15 ft x2 with use of RW, but required seated rest due to HR increased to 138bpm with activity, pain in L hip. The pt will continue to benefit from skilled PT to progress OOB mobility tolerance, stability, and safety awareness, but will be safe to return home with family support once medically ready.    Follow Up Recommendations  Home health PT;Supervision for mobility/OOB     Equipment Recommendations  None recommended by PT (pt well equipped)    Recommendations for Other Services       Precautions / Restrictions Precautions Precautions: Fall Precaution Comments: significant history of falls Restrictions Weight Bearing Restrictions: No    Mobility  Bed Mobility Overal bed mobility: Needs Assistance Bed Mobility: Rolling;Sidelying to Sit;Sit to Supine Rolling: Min guard Sidelying to sit: Min guard   Sit to supine: Min guard   General bed mobility comments: pt able to complete without assist, but minG for safety    Transfers Overall transfer level: Needs assistance Equipment used: Rolling walker (2 wheeled) Transfers: Sit to/from [facility] Sit to Stand: Min assist Stand pivot transfers: Min assist       General transfer comment: minA to power up and maintain balance while pt reaching for UE support on  RW. The pt needs cues to decrease posterior lean. minA to manage safety with pivoting  Ambulation/Gait Ambulation/Gait assistance: Min assist Gait Distance (Feet): 15 Feet (+ 15 ft) Assistive device: Rolling walker (2 wheeled) Gait Pattern/deviations: Step-through pattern;Decreased stride length;Shuffle;Trunk flexed;Narrow base of support Gait velocity: decreased Gait velocity interpretation: <1.31 ft/sec, indicative of household ambulator General Gait Details: small steps with minimal clearance, minA to manage RW and steady. no episodes of shaking or LOB during this session       Balance Overall balance assessment: Needs assistance;History of Falls Sitting-balance support: Feet supported Sitting balance-Leahy Scale: Fair Sitting balance - Comments: R leaning in sitting due to pain in L hip   Standing balance support: Bilateral upper extremity supported;During functional activity Standing balance-Leahy Scale: Poor Standing balance comment: reliant on UE support                            Cognition Arousal/Alertness: Awake/alert Behavior During Therapy: Restless;Flat affect Overall Cognitive Status: Impaired/Different from baseline Area of Impairment: Orientation;Attention;Memory;Following commands;Safety/judgement;Awareness;Problem solving                 Orientation Level: Disoriented to;Time;Situation (pt unable to name date/month, unable to name what brought her to hospital) Current Attention Level: Focused Memory: Decreased short-term memory Following Commands: Follows one step commands inconsistently;Follows one step commands with increased time Safety/Judgement: Decreased awareness of deficits;Decreased awareness of safety   Problem Solving: Slow processing;Decreased initiation;Requires verbal cues General Comments: pt needing increased time for processing, needing cues for safety, technique. Pt  able to follow simple commands, at times needing repeated  cues to answer questions, but was calm and redirectable through the session      Exercises      General Comments General comments (skin integrity, edema, etc.): HR to 138bpm with ambulation in room, recovers well with sitting rest      Pertinent Vitals/Pain Pain Assessment: Faces Faces Pain Scale: Hurts whole lot Pain Location: L hip with mobility Pain Descriptors / Indicators: Sore;Restless;Discomfort Pain Intervention(s): Limited activity within patient's tolerance;Monitored during session;Repositioned     PT Goals (current goals can now be found in the care plan section) Acute Rehab PT Goals Patient Stated Goal: to lay down in bed PT Goal Formulation: With patient Time For Goal Achievement: 06/28/21 Potential to Achieve Goals: Fair Progress towards PT goals: Progressing toward goals    Frequency    Min 4X/week      PT Plan Current plan remains appropriate       AM-PAC PT "6 Clicks" Mobility   Outcome Measure  Help needed turning from your back to your side while in a flat bed without using bedrails?: A Little Help needed moving from lying on your back to sitting on the side of a flat bed without using bedrails?: A Little Help needed moving to and from a bed to a chair (including a wheelchair)?: A Little Help needed standing up from a chair using your arms (e.g., wheelchair or bedside chair)?: A Little Help needed to walk in hospital room?: A Little Help needed climbing 3-5 steps with a railing? : A Lot 6 Click Score: 17    End of Session Equipment Utilized During Treatment: Gait belt Activity Tolerance: Patient tolerated treatment well;Patient limited by pain Patient left: in bed;with call bell/phone within reach;with bed alarm set Nurse Communication: Mobility status PT Visit Diagnosis: Other abnormalities of gait and mobility (R26.89);Repeated falls (R29.6);Muscle weakness (generalized) (M62.81)     Time: 1610-9604 PT Time Calculation (min) (ACUTE  ONLY): 24 min  Charges:  $Gait Training: 8-22 mins $Therapeutic Activity: 8-22 mins                     Karma Ganja, PT, DPT   Acute Rehabilitation Department Pager #: [phone]   Otho Bellows 06/15/2021, 1:23 PM

## 2021-06-15 NOTE — Progress Notes (Signed)
Occupational Therapy Treatment Patient Details Name: Melissa Lopez MRN: [id number] DOB: 12-Dec-1950 Today's Date: 06/15/2021    History of present illness The pt is a 70 yo female presenting 7/5 with low back pain and reports of multiple falls (9 over course of weekend) with possible seizure activity. PMH includes: CKD III, CAD, HTN, DM II, pacemaker placement, CHF, and primary hyperparathyroidism   OT comments  Pt attempting to get out of bed with Posey attached, asking to use BSC. Transferred to [facility] with Min A. A to complete hygiene. After standing to clean pt said "I have to sit" and complained of not feeling well but did not experience a syncopal episode. Pt with increased "shaking" today.  Unable to take BP in standing at this time. May benefit from use of TED hose. Will continue to follow acutely.   Follow Up Recommendations  Home health OT;Supervision/Assistance - 24 hour    Equipment Recommendations  None recommended by OT    Recommendations for Other Services      Precautions / Restrictions Precautions Precautions: Fall Precaution Comments: significant history of falls Restrictions Weight Bearing Restrictions: No       Mobility Bed Mobility Overal bed mobility: Needs Assistance Bed Mobility: Rolling;Sidelying to Sit;Sit to Supine Rolling: Min guard Sidelying to sit: Min guard   Sit to supine: Min guard   General bed mobility comments: pt able to complete without assist, but minG for safety    Transfers Overall transfer level: Needs assistance Equipment used: Rolling walker (2 wheeled) Transfers: Sit to/from [facility] Sit to Stand: Min assist Stand pivot transfers: Min assist       General transfer comment: minA to power up and maintain balance while pt reaching for UE support on RW. The pt needs cues to decrease posterior lean. minA to manage safety with pivoting    Balance Overall balance assessment: Needs assistance;History of  Falls Sitting-balance support: Feet supported Sitting balance-Leahy Scale: Fair Sitting balance - Comments: R leaning in sitting due to pain in L hip   Standing balance support: Bilateral upper extremity supported;During functional activity Standing balance-Leahy Scale: Poor Standing balance comment: reliant on UE support                           ADL either performed or assessed with clinical judgement   ADL                           Toilet Transfer: Minimal assistance;BSC;Stand-pivot   Toileting- Clothing Manipulation and Hygiene: Moderate assistance         General ADL Comments: Increased "shaking"/unsteadiness during toilet trnafer today     Vision       Perception     Praxis      Cognition Arousal/Alertness: Awake/alert Behavior During Therapy: Restless;Flat affect Overall Cognitive Status: Impaired/Different from baseline Area of Impairment: Orientation;Attention;Memory;Following commands;Safety/judgement;Awareness;Problem solving                 Orientation Level: Disoriented to;Time;Situation (pt unable to name date/month, unable to name what brought her to hospital) Current Attention Level: Focused Memory: Decreased short-term memory Following Commands: Follows one step commands inconsistently;Follows one step commands with increased time Safety/Judgement: Decreased awareness of deficits;Decreased awareness of safety   Problem Solving: Slow processing;Decreased initiation;Requires verbal cues General Comments: repeatedly trying to get OOB with posey bed alarm belt on and all rails up  Exercises     Shoulder Instructions       General Comments Max HR 130s    Pertinent Vitals/ Pain       Pain Assessment: Faces Faces Pain Scale: Hurts whole lot Pain Location: L hip with mobility Pain Descriptors / Indicators: Sore;Restless;Discomfort Pain Intervention(s): Limited activity within patient's tolerance  Home Living                                           Prior Functioning/Environment              Frequency  Min 2X/week        Progress Toward Goals  OT Goals(current goals can now be found in the care plan section)  Progress towards OT goals: Progressing toward goals  Acute Rehab OT Goals Patient Stated Goal: to use the bathroom OT Goal Formulation: With patient Time For Goal Achievement: 06/28/21 Potential to Achieve Goals: Good ADL Goals Pt Will Transfer to Toilet: with supervision;ambulating Pt Will Perform Toileting - Clothing Manipulation and hygiene: with supervision;sit to/from stand Additional ADL Goal #1: Pt/family will verbalize 3 strateiges to reduce risk of falls  Plan Discharge plan remains appropriate    Co-evaluation                 AM-PAC OT "6 Clicks" Daily Activity     Outcome Measure   Help from another person eating meals?: A Little Help from another person taking care of personal grooming?: A Little Help from another person toileting, which includes using toliet, bedpan, or urinal?: A Lot Help from another person bathing (including washing, rinsing, drying)?: A Lot Help from another person to put on and taking off regular upper body clothing?: A Little Help from another person to put on and taking off regular lower body clothing?: A Lot 6 Click Score: 15    End of Session    OT Visit Diagnosis: Unsteadiness on feet (R26.81);Repeated falls (R29.6);Muscle weakness (generalized) (M62.81);Other symptoms and signs involving cognitive function;Dizziness and giddiness (R42);Pain Pain - Right/Left: Left Pain - part of body: Hip   Activity Tolerance Patient tolerated treatment well   Patient Left in bed;with call bell/phone within reach;with bed alarm set   Nurse Communication Mobility status (encourage use of BSC)        Time: 0768-0881 OT Time Calculation (min): 20 min  Charges: OT General Charges $OT Visit: 1 Visit OT  Treatments $Self Care/Home Management : 8-22 mins  Maurie Boettcher, OT/L   Acute OT Clinical Specialist [place] Pager [phone] Office [phone]    [facility] 06/15/2021, 2:47 PM

## 2021-06-15 NOTE — Progress Notes (Addendum)
Subjective:   ON: Patient has single episode of non bloody emesis and nausea overnight. Patient was given IV Zofran. Patient had Bps elevated to 412I systolic. Patient's foley leaking, replaced with larger foley, urine collection restarted.  Patient was agitated, pulling at IVs. Re-direction would only settle her for a few moments before she again tries to get out of bed. She endorses abdominal pain and points to suprapubic region. She knows she is in the hospital and that she lives with her daughter at home. She does not know the year.   Objective:  Vital signs in last 24 hours: Vitals:   06/15/21 0017 06/15/21 0206 06/15/21 0356 06/15/21 0728  BP: (!) 128/105 (!) 153/112 (!) 175/92   Pulse: 90 84 79   Resp: (!) 23 (!) 23 17   Temp:   98.4 F (36.9 C) 98.5 F (36.9 C)  TempSrc:   Oral Oral  SpO2: 97% 96% 95%   Weight:      Height:        Intake/Output Summary (Last 24 hours) at 06/15/2021 0737 Last data filed at 06/15/2021 0440 Gross per 24 hour  Intake 3086.73 ml  Output 1655 ml  Net 1431.73 ml    CBC Latest Ref Rng & Units 06/15/2021 06/14/2021 06/13/2021  WBC 4.0 - 10.5 K/uL 27.2(H) 18.8(H) 12.2(H)  Hemoglobin 12.0 - 15.0 g/dL 14.4 16.6(H) 14.7  Hematocrit 36.0 - 46.0 % 40.2 49.2(H) 44.0  Platelets 150 - 400 K/uL 189 224 230    BMP Latest Ref Rng & Units 06/15/2021 06/14/2021 06/14/2021  Glucose 70 - 99 mg/dL 144(H) 179(H) 265(H)  BUN 8 - 23 mg/dL 25(H) 27(H) 25(H)  Creatinine 0.44 - 1.00 mg/dL 1.65(H) 1.89(H) 2.03(H)  Sodium 135 - 145 mmol/L 139 137 137  Potassium 3.5 - 5.1 mmol/L 3.6 3.5 3.5  Chloride 98 - 111 mmol/L 111 108 104  CO2 22 - 32 mmol/L 18(L) 17(L) 18(L)  Calcium 8.9 - 10.3 mg/dL 11.8(H) 13.2(HH) 14.1(HH)    Parathyroid hormone, intact (no Ca) [[id number]] (Abnormal) Collected: 06/14/21 1422  Specimen: Blood Updated: 06/15/21 0637   PTH 100 High  pg/mL   CBC [[id number]] (Abnormal)    VITAMIN D 25 Hydroxy (Vit-D Deficiency, Fractures) [[id number]] Collected:  06/14/21 1422  Specimen: Blood Updated: 06/14/21 1623   Vit D, 25-Hydroxy 91.45 ng/mL    KUB 7/7: FINDINGS: The bowel gas pattern is normal. No radio-opaque calculi or other significant radiographic abnormality are seen. IMPRESSION: Negative exam.  By: Inge Rise M.D  MRI [place] WITHOUT CONTRAST 06/14/2021 IMPRESSION: 1. Abbreviated, sagittal only examination. 2. No evidence of cord compression or definite cord signal abnormality in the cervical or thoracic spine. 3. Cervical and thoracic disc degeneration most notable at C5-6 where there is mild spinal stenosis and asymmetrically severe left neural foraminal stenosis. 4. No thoracic spinal stenosis.   By: Logan Bores M.D.   MRI [place] WITHOUT CONTRAST 06/14/2021 IMPRESSION: 1. Abbreviated, sagittal only examination. 2. No evidence of cord compression or definite cord signal abnormality in the cervical or thoracic spine. 3. Cervical and thoracic disc degeneration most notable at C5-6 where there is mild spinal stenosis and asymmetrically severe left neural foraminal stenosis. 4. No thoracic spinal stenosis.   Physical Exam:  General: Appears uncomfortable, elderly appearing white female HENT: normocephalic, atraumatic EYES: tracks examiner, EOM intact all cardinal directions, no scleral icterus CV: tachycardic, normal rhythm, no murmurs, rubs, gallops.  Pulmonary: sating well on RA, lung clear to  auscultation, no rales, wheezes, rhonchi Abdominal: non-distended, soft, tenderness to palpation supra pubic area, normal BS Neurological: Patient is awake, alert, oriented to self and place but not time (did not know year). Patient noted to have postural tremor. No resting tremor, no action tremor. Reflexes: brisk 3+ reflexes biceps, patellar, achilles, with few beats of clonus on L foot. Negative babinski bilaterally.  Motor: 5/5 BUE, LLE 5/5, RLE hip flexion  4/5.  Assessment/Plan:  Active Problems:   Syncope   Seizure-like activity (HCC)   Orthostasis  Ms. Tila Millirons is a 70 yo female with PMHx of CKD stage 3, nonobstructive CAD (cath 11/2020), HTN, SSS s/p pacemaker, hypercalcemia, primary hyperparathyroidism, T2DM with diabetic nephropathy and gastroparesis, who presented to [facility] with shaking and falls. Patient was found to have AKI, hypercalcemia, hyperreflexia and possible UTI. Patient was admitted for further workup and evaluation of shaking spells.  # Convulsive syncope # Orthostatic hypotension # hx of frequent falls Presents with signs and symptoms concerning for syncope with seizure like activity.  Has significant risk factors for syncope including multiple centrally acting medications, diabetes with possible autonomic dysregulation, and multiple antihypertensive medications which could be contributing to her underlying presentation. Recent CT scan without contrast without evidence of acute ischemic infarct or ICH. EEG was performed on admission without any evidence of subclinical seizures. MRI cervical and thoracic spine without compression or lesion. Neurology feels these shaking episodes are likely due to underlying medical condition/dehydration and not a neurological condition. Orthostatics were positive for hypotension with drop in BP systolic greater than 20 (140->98). Given the patients T9QZ complications: gastroparesis, nephropathy, urge urinary incontinence, the patient's hypotensive symptoms are likely due to ANS dysfunction 2/2 T2DM. Patient has also been vomiting with fluid losses which could contribute to hypotensive symptoms. Patient received 4mL/hr LR overnight 7/6 and started on 250cc/hr on 7/7. Patient has possible UTI infection which could contribute to hypotensive symptoms.  -Neurology consulted appreciate recs -maintenance fluids 150cc/hr -treating UTI as below -repeat orthostatics    #hyperreflexia on exam Patient  has hyperreflexia on exam with some UMN findings concerning for CNS lesion. MRI cervical and thoracic spine negative for cord compression or cord signal abnormality/lesion. Patient does have hx of prior R thalamic stroke, unlikely hyperreflexia is from small lacunar stroke? Patient with normal reflexes on exam at outpatient neurology visit with Dr.Vikram Penumalli on 04/16/2021. Hypercalcemia does not cause hyperreflexia, hypocalcemia can cause hyperreflexia.  -Neurology consulted appreciate rec - MRI Brain? Neurology does not feel that benefit of obtaining MRI would outweigh the risks of patient undergoing long MRI study with incompatible pacemaker in place.   # Resistant HTN Patient has a significant history of hypertension on multiple blood pressure medications including amlodipine 10 mg, carvedilol 6.25 mg twice daily, hydralazine 25 mg nightly, lisinopril 20 mg daily.  Patient's daughter states that the amlodipine and the hydralazine were recently discontinued due to concern for orthostatic hypotension. Patient's BP is elevated to 009Q systolic. Patient will need fluids for hypercalcemia and to replace GI losses though with elevated BP, we are monitoring carefully. -coreg 6.25mg  -amlodipine 10mg  -PRN labetalol with parameters   #Hypercalcemia Hx primary hyperparathyroidism Hx of constipation, kidney stones Ca 14.1 with history of primary hyperparathyroidism and hx of hypercalcemia. Per daughter patient's normal calcium is 10-11. She states when patient is vomiting, her calcium will increase as she becomes dehydrated. She has seen surgery for her primary hyperparathyroidism several years ago in [place] and they noted a small nodule on her parathyroid  gland and surveillance was recommended. PTH elevated to 100 and vitamin D normal at 91, consistent with primary hyperparathyroidism. Patient's Ca on 7/8 has improved to 11.8. Ultimately patient will need parathyroidectomy. -150cc/hr IV  fluids -Cinacalcet 30mg  BID -PTHrP -calcitonin -24hr urine calcium pending -general surgery consult for parathyroidectomy given elevated calcium to 14.1 this admission and previous admission in Oct 2021 for hypercalcemia with calcium elevated to 13.5. Additionally, patient has hx of symptoms consistent with hypercalcemia including nausea vomiting, constipation, as well as history of kidney stones and peripheral kidney calcifications on imaging. NM Parathyroid scan w/spect in October 2021 negative for parathyroid adenoma.  -if patient does not want to proceed with surgery then patient may benefit from starting [place] for calcium management.   #Acute on Chronic Kidney Disease (Stage IIIB #diabetic nephropathy Patient presents with acute elevation in creatinine to 2.51 from a baseline of 1.5 and a GFR of 20 from a baseline of 36.  She appears to have baseline CKD stage IIIb likely secondary to underlying diabetes and hypertension. Random urine sodium and creatinine are normal.  CT abdomen pelvis did not show any acute urinary issues, she does however have multiple bilateral renal cysts and varying size and complexity is which may need further evaluation.  She has received 1 L of IV fluids in the ED and received maintenance fluids. Repeat CMP showed improvement in Cr to 1.65 on 7/8. Treating for UTI as above. Renal US showed no R artery stenosis with 1-59% stenosis of L renal artery as well as multiple cysts, largest 4.3cm and possible calculus. 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 -Nephrology following appreciate recs  -Cinacalcet 30mg  BID -150cc/hr IV fluids -Strict ins and outs to trend urine output -Avoid nephrotoxic medications -Daily kidney function and electrolytes  #UTI #urge urinary incontinence Patient presented with mild leukocytosis of 14 on admission. WBC increased to 18.8 on 7/7. Patient was given a dose of ceftriaxone in the ED due to concern for possible urinary tract  infection. UA with large leukocytes and rare bacteria. Patient with supra pubic pain 7/7. Urine culture came back with multiple specimens. Patient has history of overactive bladder and is on oxybutynin at home. On 7/8 worsening leukocytosis to 27.2. -urine culture repeat clean catch pending -ceftriaxone for 10 days (end date 7/15) -oxybutynin for overactive bladder  #leukocytosis Patient with worsening leukocytosis to 27.2 on 7/8 despite treating UTI. No other signs of infectious sources. Malignancy is on the differential. CT ABDPEL showed a dilated common duct 33mm, and radiology feels this is likely post surgical, however we would like general surgery's opinion on the possibility of new obstruction from malignancy. NM Parathyroid scan w/ spect in October 2021 negative for parathyroid adenoma.  -blood smear -discuss CT Abs findings with general surgery   #Nausea/Vomiting Patient developed nausea and vomiting in the ED. Per daughter, patient often has n/v at home and it occurs when she has not eaten. At home patient will take pepcid with improvement in symptoms. Nausea and vomiting improved on pepcid. Pateint expressed concerns about feeling constipated despite two large Bms overnight and soft non distended abdomen. KUB with normal bowel gas pattern, no radio-opaque calculi. Patient's high calcium of 14 may be contributing to n/v. Patient also has UTI which may be contributing as well. Patient still having n/v. -Pepcid 10mg  daily with prn Zofran 4mg  -150cc/hr IV fluids   #Sick sinus syndrome Patient has sick sinus syndrome status post pacemaker.  Recent interrogation was performed in the past several months  without any problems.    #T2DM #gastroparesis Patient has a history of type 2 diabetes on Tresiba 5 units daily.  Her last hemoglobin A1c was 6.5.  She does however have significant complications associated with her diabetes including diabetic nephropathy and gastroparesis.  She was  recently taken off of Reglan due to concerns for possible changes in her mentation -sliding scale insulin and CBG monitoring -erythromycin ethylsuccinate for gastroparesis   #Prior CVA R Thalamic lacunar stroke Residual left sided numbness per most recent Neurology note (Dr. Andrey Spearman). Patient had previous fall, hitting her left forehead and eye per his note.She has had several falls last few months as well as 60-80 pound weight loss.CTH showed moderate chronic microvascular ischemic change. Old right thalamic lacunar infarct. Significant left periorbital hemorrhage/swelling. No injury to the left globe or postseptal orbit. No fractures. No acute intracranial abnormalities.  -continue aspirin 81mg   #CAD nonobstructive #HLD Patient has a history of hyperlipidemia CAD. Patient had EKG findings several years back concerning for NSTEMI. Cardiac catheterization was done and showed nonobstructive CAD. Patient is currently on rosuvastatin 5 mg daily. -Continue rosuvastatin 5 mg daily  #HFpEF: Patient has a history of heart failure with preserved ejection fraction.  Her last echo was performed in 11/27/2020 and showed an EF of 60 to 65% with grade 1 diastolic dysfunction.  Patient appears volume down on exam.  Furthermore she is not on any diuretic therapy at home  Diet: Heart Healthy VTE: Heparin IVF: 150cc/hr Code: Full  Prior to Admission Living Arrangement: Home, lives with daughter who provides support Anticipated Discharge Location: Home vs SNF Barriers to Discharge: continuing diagnostic workup of shaking spells, hyperreflexia, treating hypercalcemia and AKI Dispo: Anticipated discharge in approximately 2-3 day(s).   Wayland Denis, MD 06/15/2021, 7:37 AM Pager: [phone] After 5pm on weekdays and 1pm on weekends: On Call pager [phone]

## 2021-06-16 LAB — BASIC METABOLIC PANEL
Anion gap: 11 (ref 5–15)
BUN: 21 mg/dL (ref 8–23)
CO2: 21 mmol/L — ABNORMAL LOW (ref 22–32)
Calcium: 11.2 mg/dL — ABNORMAL HIGH (ref 8.9–10.3)
Chloride: 109 mmol/L (ref 98–111)
Creatinine, Ser: 1.45 mg/dL — ABNORMAL HIGH (ref 0.44–1.00)
GFR, Estimated: 39 mL/min — ABNORMAL LOW (ref >60)
Glucose, Bld: 158 mg/dL — ABNORMAL HIGH (ref 70–99)
Potassium: 2.4 mmol/L — CL (ref 3.5–5.1)
Sodium: 141 mmol/L (ref 135–145)

## 2021-06-16 LAB — CBC
HCT: 40 % (ref 36.0–46.0)
Hemoglobin: 14.5 g/dL (ref 12.0–15.0)
MCH: 31.3 pg (ref 26.0–34.0)
MCHC: 36.3 g/dL — ABNORMAL HIGH (ref 30.0–36.0)
MCV: 86.2 fL (ref 80.0–100.0)
Platelets: 140 10*3/uL — ABNORMAL LOW (ref 150–400)
RBC: 4.64 MIL/uL (ref 3.87–5.11)
RDW: 12.4 % (ref 11.5–15.5)
WBC: 24.9 10*3/uL — ABNORMAL HIGH (ref 4.0–10.5)
nRBC: 0 % (ref 0.0–0.2)

## 2021-06-16 LAB — COMPREHENSIVE METABOLIC PANEL
ALT: 23 U/L (ref 0–44)
AST: 28 U/L (ref 15–41)
Albumin: 3.6 g/dL (ref 3.5–5.0)
Alkaline Phosphatase: 72 U/L (ref 38–126)
Anion gap: 11 (ref 5–15)
BUN: 24 mg/dL — ABNORMAL HIGH (ref 8–23)
CO2: 19 mmol/L — ABNORMAL LOW (ref 22–32)
Calcium: 11.1 mg/dL — ABNORMAL HIGH (ref 8.9–10.3)
Chloride: 109 mmol/L (ref 98–111)
Creatinine, Ser: 1.47 mg/dL — ABNORMAL HIGH (ref 0.44–1.00)
GFR, Estimated: 38 mL/min — ABNORMAL LOW (ref >60)
Glucose, Bld: 193 mg/dL — ABNORMAL HIGH (ref 70–99)
Potassium: 3 mmol/L — ABNORMAL LOW (ref 3.5–5.1)
Sodium: 139 mmol/L (ref 135–145)
Total Bilirubin: 1.3 mg/dL — ABNORMAL HIGH (ref 0.3–1.2)
Total Protein: 6.5 g/dL (ref 6.5–8.1)

## 2021-06-16 LAB — GLUCOSE, CAPILLARY
Glucose-Capillary: 157 mg/dL — ABNORMAL HIGH (ref 70–99)
Glucose-Capillary: 188 mg/dL — ABNORMAL HIGH (ref 70–99)
Glucose-Capillary: 159 mg/dL — ABNORMAL HIGH (ref 70–99)
Glucose-Capillary: 142 mg/dL — ABNORMAL HIGH (ref 70–99)

## 2021-06-16 LAB — MAGNESIUM
Magnesium: 1.6 mg/dL — ABNORMAL LOW (ref 1.7–2.4)
Magnesium: 2.2 mg/dL (ref 1.7–2.4)

## 2021-06-16 MED ORDER — ERYTHROMYCIN ETHYLSUCCINATE 400 MG/5ML PO SUSR
400.0000 mg | Freq: Three times a day (TID) | ORAL | Status: AC
  Administered 2021-06-16 – 2021-06-17 (×4): 400 mg via ORAL
  Filled 2021-06-16 (×5): qty 5

## 2021-06-16 MED ORDER — MAGNESIUM SULFATE 2 GM/50ML IV SOLN
2.0000 g | Freq: Once | INTRAVENOUS | Status: AC
  Administered 2021-06-16: 2 g via INTRAVENOUS
  Filled 2021-06-16: qty 50

## 2021-06-16 MED ORDER — ENOXAPARIN SODIUM 40 MG/0.4ML IJ SOSY
40.0000 mg | PREFILLED_SYRINGE | INTRAMUSCULAR | Status: DC
  Administered 2021-06-16: 40 mg via SUBCUTANEOUS
  Filled 2021-06-16 (×2): qty 0.4

## 2021-06-16 MED ORDER — FAMOTIDINE 20 MG PO TABS
20.0000 mg | ORAL_TABLET | Freq: Every day | ORAL | Status: DC
  Administered 2021-06-16 – 2021-06-18 (×3): 20 mg via ORAL
  Filled 2021-06-16 (×3): qty 1

## 2021-06-16 MED ORDER — POTASSIUM CHLORIDE CRYS ER 20 MEQ PO TBCR
40.0000 meq | EXTENDED_RELEASE_TABLET | Freq: Two times a day (BID) | ORAL | Status: AC
  Administered 2021-06-16 (×2): 40 meq via ORAL
  Filled 2021-06-16 (×2): qty 2

## 2021-06-16 MED ORDER — POTASSIUM CHLORIDE 10 MEQ/100ML IV SOLN
10.0000 meq | INTRAVENOUS | Status: AC
  Administered 2021-06-16 (×6): 10 meq via INTRAVENOUS
  Filled 2021-06-16 (×4): qty 100

## 2021-06-16 NOTE — Progress Notes (Signed)
[facility] Progress Note    Assessment/ Plan:   AKI on CKD3b, improved -underlying CKD likely related to h/o AKI, hypercalcemia, HTN, and DM. 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 Baseline Cr seems to hover around 2 -c/w IVF -Avoid nephrotoxic medications including NSAIDs and iodinated intravenous contrast exposure unless the latter is absolutely indicated.  Preferred narcotic agents for pain control are hydromorphone, fentanyl, and methadone. Morphine should not be used. Avoid Baclofen and avoid oral sodium phosphate and magnesium citrate based laxatives / bowel preps. Continue strict Input and Output monitoring. Will monitor the patient closely with you and intervene or adjust therapy as indicated by changes in clinical status/labs   Hypercalcemia, h/o primary hyperparathyroidism -c/w IVF and calcitonin. PTH drawn 7/7-100. Calcium improving -PTHrP pending from 7/8 for completion of work up -sensipar 30mg  bid added 7/8 -overall needs a parathyroidectomy, surgery consulted per primary service  Syncope, orthostatic hypotension -autonomic dysfunction 2/2 DM but was noted to have hyperreflexia on admit -per neuro and primary. Monitor orthostats regularly, on BB  H/o HTN -on amlodipine and coreg. Orthostatic hypotension as above so will need to carefully adjust meds  DM2 with hyperglycemia -mgmt per primary service  Gean Quint, MD [facility]  Subjective:   No acute events. More conversant with clearer mentation today. She does report feeling dehydrated and some abd pain. Cr down to 1.5 today, cal 11.2.   Objective:   BP (!) 180/98   Pulse 76   Temp 98.1 F (36.7 C) (Oral)   Resp 14   Ht 5\' 5"  (1.651 m)   Wt 63.5 kg   SpO2 98%   BMI 23.30 kg/m   Intake/Output Summary (Last 24 hours) at 06/16/2021 1041 Last data filed at 06/16/2021  0646 Gross per 24 hour  Intake 120 ml  Output 1000 ml  Net -880 ml   Weight change:   Physical Exam: Gen:nad CVS:dry mucosal membranes Resp:s1s2, rrr Abd:cta bl Ext:no edema Neuro: speech clear and coherent, moves all ext sponteneously  Imaging: MR CERVICAL SPINE WO CONTRAST  Result Date: 06/14/2021 CLINICAL DATA:  Cord compression. EXAM: MRI CERVICAL AND THORACIC SPINE WITHOUT CONTRAST TECHNIQUE: Multiecho pulse sequences of the cervical spine, to include the craniocervical junction and cervicothoracic junction, and the thoracic spine, were obtained without intravenous contrast. COMPARISON:  Cervical spine CT 06/13/2021.  Chest CT 06/13/2021. FINDINGS: Abbreviated examinations were performed consisting of sagittal T2 imaging of the cervical and thoracic spine to limit the scan time given the patient's pacemaker. Cervical spine sequences are mildly motion degraded despite multiple attempts at repeat imaging. There is trace anterolisthesis of C7 on T1. Thoracic vertebral alignment is normal. Within limitations of motion artifact, the cervical spinal cord appears normal in signal and morphology. The thoracic spinal cord is also normal in signal and morphology, and the conus medullaris terminates at T12-L1. Detailed assessment of degenerative changes is limited on this abbreviated examination. There is severe disc space narrowing at C5-6 and mild-to-moderate narrowing at C6-7 with associated degenerative endplate changes. A broad-based posterior disc osteophyte complex at C5-6 results in likely mild spinal stenosis without evidence of cord compression. There is mild disc bulging at C6-7 without evidence of significant spinal stenosis. Disc bulging and uncovertebral spurring result in suspected mild-to-moderate right and potentially severe left neural foraminal stenosis at C5-6 as well as suspected mild right and moderate left neural foraminal stenosis  at C6-7. In the thoracic spine, there is mild disc  bulging in the mid and lower thoracic spine which is greatest at T8-9 without associated spinal stenosis or cord compression. There is moderate asymmetric left-sided facet arthrosis in the mid to lower thoracic spine with mild left neural foraminal stenosis at T8-9 and T9-10. IMPRESSION: 1. Abbreviated, sagittal only examination. 2. No evidence of cord compression or definite cord signal abnormality in the cervical or thoracic spine. 3. Cervical and thoracic disc degeneration most notable at C5-6 where there is mild spinal stenosis and asymmetrically severe left neural foraminal stenosis. 4. No thoracic spinal stenosis. Electronically Signed   By: Logan Bores M.D.   On: 06/14/2021 17:22   MR THORACIC SPINE WO CONTRAST  Result Date: 06/14/2021 CLINICAL DATA:  Cord compression. EXAM: MRI CERVICAL AND THORACIC SPINE WITHOUT CONTRAST TECHNIQUE: Multiecho pulse sequences of the cervical spine, to include the craniocervical junction and cervicothoracic junction, and the thoracic spine, were obtained without intravenous contrast. COMPARISON:  Cervical spine CT 06/13/2021.  Chest CT 06/13/2021. FINDINGS: Abbreviated examinations were performed consisting of sagittal T2 imaging of the cervical and thoracic spine to limit the scan time given the patient's pacemaker. Cervical spine sequences are mildly motion degraded despite multiple attempts at repeat imaging. There is trace anterolisthesis of C7 on T1. Thoracic vertebral alignment is normal. Within limitations of motion artifact, the cervical spinal cord appears normal in signal and morphology. The thoracic spinal cord is also normal in signal and morphology, and the conus medullaris terminates at T12-L1. Detailed assessment of degenerative changes is limited on this abbreviated examination. There is severe disc space narrowing at C5-6 and mild-to-moderate narrowing at C6-7 with associated degenerative endplate changes. A broad-based posterior disc osteophyte complex at  C5-6 results in likely mild spinal stenosis without evidence of cord compression. There is mild disc bulging at C6-7 without evidence of significant spinal stenosis. Disc bulging and uncovertebral spurring result in suspected mild-to-moderate right and potentially severe left neural foraminal stenosis at C5-6 as well as suspected mild right and moderate left neural foraminal stenosis at C6-7. In the thoracic spine, there is mild disc bulging in the mid and lower thoracic spine which is greatest at T8-9 without associated spinal stenosis or cord compression. There is moderate asymmetric left-sided facet arthrosis in the mid to lower thoracic spine with mild left neural foraminal stenosis at T8-9 and T9-10. IMPRESSION: 1. Abbreviated, sagittal only examination. 2. No evidence of cord compression or definite cord signal abnormality in the cervical or thoracic spine. 3. Cervical and thoracic disc degeneration most notable at C5-6 where there is mild spinal stenosis and asymmetrically severe left neural foraminal stenosis. 4. No thoracic spinal stenosis. Electronically Signed   By: Logan Bores M.D.   On: 06/14/2021 17:22   VAS US RENAL ARTERY DUPLEX  Result Date: 06/15/2021 ABDOMINAL VISCERAL Patient Name:  Melissa Lopez  Date of Exam:   06/15/2021 Medical Rec #: [id number]         Accession #:    [id number] Date of Birth: 07-Jul-1951         Patient Gender: F Patient Age:   89Y Exam Location:  [facility] Procedure:      VAS US RENAL ARTERY DUPLEX Referring Phys: [id number] MARTIN WEBB -------------------------------------------------------------------------------- Indications: Hypertension, anemia, chronic renal failure stage 3 High Risk Factors: Diabetes, past history of smoking, coronary artery disease. Limitations: Air/bowel gas. Comparison Study: No prior study Performing Technologist: Maudry Mayhew MHA, RDMS, RVT, RDCS  Examination Guidelines: A  complete evaluation includes B-mode imaging, spectral  Doppler, color Doppler, and power Doppler as needed of all accessible portions of each vessel. Bilateral testing is considered an integral part of a complete examination. Limited examinations for reoccurring indications may be performed as noted.  Duplex Findings: +------------------+--------+--------+------------------------+----------------+ Mesenteric        PSV cm/sEDV cm/s         Plaque             Comments     +------------------+--------+--------+------------------------+----------------+ Aorta Prox           52            Plaque vs thrombus in      3.0cm AP                                       area of aneurysm/ectasis    diameter     +------------------+--------+--------+------------------------+----------------+ Celiac Artery       174                                                    Origin                                                                     +------------------+--------+--------+------------------------+----------------+ SMA Proximal         74      20                                            +------------------+--------+--------+------------------------+----------------+ IMA                  61                                                    +------------------+--------+--------+------------------------+----------------+    +------------------+--------+--------+-------+ Right Renal ArteryPSV cm/sEDV cm/sComment +------------------+--------+--------+-------+ Origin               63      18           +------------------+--------+--------+-------+ Proximal             56      20           +------------------+--------+--------+-------+ Mid                  52      15           +------------------+--------+--------+-------+ Distal               42      15           +------------------+--------+--------+-------+ +-----------------+--------+--------+-------+ Left Renal ArteryPSV cm/sEDV cm/sComment  +-----------------+--------+--------+-------+ Origin             127  40           +-----------------+--------+--------+-------+ Proximal           152      40           +-----------------+--------+--------+-------+ Mid                140      41           +-----------------+--------+--------+-------+ Distal             134      32           +-----------------+--------+--------+-------+ +------------+--------+--------+----+-----------+--------+--------+----+ Right KidneyPSV cm/sEDV cm/sRI  Left KidneyPSV cm/sEDV cm/sRI   +------------+--------+--------+----+-----------+--------+--------+----+ Upper Pole  15      5       0.68Upper Pole 22      7       0.70 +------------+--------+--------+----+-----------+--------+--------+----+ Mid         27      9       0.68Mid        22      8       0.62 +------------+--------+--------+----+-----------+--------+--------+----+ Lower Pole  13      6       0.55Lower Pole 19      8       0.59 +------------+--------+--------+----+-----------+--------+--------+----+ Hilar       17      7       0.62Hilar      25      8       0.69 +------------+--------+--------+----+-----------+--------+--------+----+ +------------------+---------+------------------+---------+ Right Kidney               Left Kidney                 +------------------+---------+------------------+---------+ RAR                        RAR                         +------------------+---------+------------------+---------+ RAR (manual)      1.2      RAR (manual)      2.9       +------------------+---------+------------------+---------+ Cortex            19/7 cm/sCortex            20/8 cm/s +------------------+---------+------------------+---------+ Cortex thickness           Corex thickness             +------------------+---------+------------------+---------+ Kidney length (cm)10.80    Kidney length (cm)13.20      +------------------+---------+------------------+---------+  Summary: Largest Aortic Diameter: 3.0 cm in the proximal segment with plaque vs thrombus, suggestive of ectasis, borderline aneurysmal.  Renal:  Right: No evidence of right renal artery stenosis. RRV flow present.        Multiple cysts noted, largest measuring 4.3cm. Echogenic area        of the lower pole noted, suggestive of possible calculus. Left:  1-59% stenosis of the left renal artery. LRV flow present.        Multiple cysts noted, largest measuring 2.9cm. Mesenteric: Normal Celiac artery , Superior Mesenteric artery and Inferior Mesenteric artery findings.  *See table(s) above for measurements and observations.     Preliminary     Labs: BMET Recent Labs  Lab 06/12/21 1539 06/13/21 1752 06/14/21 0908 06/14/21 1422 06/15/21 0037 06/16/21 0243 06/16/21 0940  NA 136 140 137  137 139 141 139  K 3.9 4.1 3.5 3.5 3.6 2.4* 3.0*  CL 108 110 104 108 111 109 109  CO2 22 21* 18* 17* 18* 21* 19*  GLUCOSE 101* 106* 265* 179* 144* 158* 193*  BUN 34* 21 25* 27* 25* 21 24*  CREATININE 2.51* 1.86* 2.03* 1.89* 1.65* 1.45* 1.47*  CALCIUM 11.7* 12.7* 14.1* 13.2* 11.8* 11.2* 11.1*  PHOS  --  2.2*  --   --   --   --   --    CBC Recent Labs  Lab 06/12/21 1539 06/13/21 1752 06/14/21 0353 06/15/21 0438 06/16/21 0243  WBC 14.1* 12.2* 18.8* 27.2* 24.9*  NEUTROABS 8.0* 8.1*  --   --   --   HGB 13.4 14.7 16.6* 14.4 14.5  HCT 40.2 44.0 49.2* 40.2 40.0  MCV 93.9 93.0 94.1 88.0 86.2  PLT 220 230 224 189 140*    Medications:     amLODipine  10 mg Oral Daily   aspirin  81 mg Oral Daily   carvedilol  6.25 mg Oral BID WC   Chlorhexidine Gluconate Cloth  6 each Topical Daily   cinacalcet  30 mg Oral BID WC   enoxaparin (LOVENOX) injection  40 mg Subcutaneous Q24H   erythromycin  400 mg Oral Q8H   escitalopram  20 mg Oral Daily   famotidine  20 mg Oral Daily   gabapentin  100 mg Oral TID   insulin aspart  0-9 Units Subcutaneous TID WC    oxybutynin  10 mg Oral Daily   pantoprazole  20 mg Oral Daily   potassium chloride  40 mEq Oral BID   rosuvastatin  5 mg Oral Daily   traZODone  50 mg Oral QHS      Gean Quint, MD [facility] 06/16/2021, 10:41 AM

## 2021-06-16 NOTE — Progress Notes (Signed)
Patient had a critical lab value of potassium 2.4. MD notified. Potassium 76mEq in 100 mL IVPB ordered.

## 2021-06-16 NOTE — Progress Notes (Signed)
Physical Therapy Treatment Patient Details Name: Melissa Lopez MRN: [id number] DOB: 17-Nov-1951 Today's Date: 06/16/2021    History of Present Illness The pt is a 70 yo female presenting 7/5 with low back pain and reports of multiple falls (9 over course of weekend) with possible seizure activity. PMH includes: CKD III, CAD, HTN, DM II, pacemaker placement, CHF, and primary hyperparathyroidism    PT Comments    The pt was eager to mobilize upon arrival of PT, attempting to get OOB despite bed alarm, posey belt, and multiple verbal reminders not to get up without assist. The pt was adamant about needing to go to bathroom to urinate despite repeated attempts to re-orient to catheter. The pt was able to complete bed mobility without assist, but continues to need management for lines due to pt with decreased awareness. The pt was also able to complete multiple short bouts of ambulation in the room, but states she is limited due to fatigue and pain in L hip. The pt was easily encouraged to continue OOB mobility, and was able to complete x4 bouts with minG-minA and use of RW. The pt will continue to benefit from max OOB mobility from all staff and continued skilled PT to progress endurance and dynamic stability.    Follow Up Recommendations  Home health PT;Supervision for mobility/OOB     Equipment Recommendations  None recommended by PT (pt well equipped)    Recommendations for Other Services       Precautions / Restrictions Precautions Precautions: Fall Precaution Comments: significant history of falls Restrictions Weight Bearing Restrictions: No    Mobility  Bed Mobility Overal bed mobility: Needs Assistance Bed Mobility: Rolling;Sidelying to Sit;Sit to Supine Rolling: Supervision Sidelying to sit: Supervision   Sit to supine: Supervision   General bed mobility comments: supervision for line management, no physical assist given.    Transfers Overall transfer level: Needs  assistance Equipment used: Rolling walker (2 wheeled) Transfers: Sit to/from Stand Sit to Stand: Min guard         General transfer comment: minG with RW to power up, cues for hand placement  Ambulation/Gait Ambulation/Gait assistance: Min guard;Min assist Gait Distance (Feet): 15 Feet (+20 ft + 15 ft + 15 ft) Assistive device: Rolling walker (2 wheeled) Gait Pattern/deviations: Step-through pattern;Decreased stride length;Shuffle;Trunk flexed;Narrow base of support Gait velocity: decreased Gait velocity interpretation: <1.31 ft/sec, indicative of household ambulator General Gait Details: small steps but improved clearance with continued mobility. The pt requested return to supine after initial 20 ft, was agreeable to continued ambulation with max encouragement. The pt reports increasing L hip pain, all VSS with gait       Balance Overall balance assessment: Needs assistance;History of Falls Sitting-balance support: Feet supported Sitting balance-Leahy Scale: Fair     Standing balance support: Bilateral upper extremity supported;During functional activity Standing balance-Leahy Scale: Poor Standing balance comment: reliant on UE support                            Cognition Arousal/Alertness: Awake/alert Behavior During Therapy: Restless;Flat affect Overall Cognitive Status: Impaired/Different from baseline Area of Impairment: Orientation;Attention;Memory;Following commands;Safety/judgement;Awareness;Problem solving                 Orientation Level: Disoriented to;Place;Situation Current Attention Level: Focused Memory: Decreased short-term memory Following Commands: Follows one step commands inconsistently;Follows one step commands with increased time Safety/Judgement: Decreased awareness of deficits;Decreased awareness of safety Awareness: Intellectual Problem Solving: Slow processing;Decreased initiation;Requires verbal  cues General Comments:  repeatedly trying to get OOB with posey bed alarm belt on and all rails up, pt stating she needs to go to the bathroom to urinate despite repeated reminders of catheter. Pt with limited recall of any education during session      Exercises      General Comments General comments (skin integrity, edema, etc.): VSS on RA, pt with x1 posterior LOB, able to correct with minA      Pertinent Vitals/Pain Pain Assessment: Faces Faces Pain Scale: Hurts even more Pain Location: L hip with mobility Pain Descriptors / Indicators: Sore;Restless;Discomfort Pain Intervention(s): Limited activity within patient's tolerance;Monitored during session;Repositioned     PT Goals (current goals can now be found in the care plan section) Acute Rehab PT Goals Patient Stated Goal: to use the bathroom PT Goal Formulation: With patient Time For Goal Achievement: 06/28/21 Potential to Achieve Goals: Fair Progress towards PT goals: Progressing toward goals    Frequency    Min 4X/week      PT Plan Current plan remains appropriate       AM-PAC PT "6 Clicks" Mobility   Outcome Measure  Help needed turning from your back to your side while in a flat bed without using bedrails?: A Little Help needed moving from lying on your back to sitting on the side of a flat bed without using bedrails?: A Little Help needed moving to and from a bed to a chair (including a wheelchair)?: A Little Help needed standing up from a chair using your arms (e.g., wheelchair or bedside chair)?: A Little Help needed to walk in hospital room?: A Little Help needed climbing 3-5 steps with a railing? : A Lot 6 Click Score: 17    End of Session Equipment Utilized During Treatment: Gait belt Activity Tolerance: Patient tolerated treatment well;Patient limited by pain Patient left: in bed;with call bell/phone within reach;with bed alarm set Nurse Communication: Mobility status PT Visit Diagnosis: Other abnormalities of gait and  mobility (R26.89);Repeated falls (R29.6);Muscle weakness (generalized) (M62.81)     Time: 4967-5916 PT Time Calculation (min) (ACUTE ONLY): 23 min  Charges:  $Gait Training: 8-22 mins $Therapeutic Activity: 8-22 mins                     Karma Ganja, PT, DPT   Acute Rehabilitation Department Pager #: [phone]   Otho Bellows 06/16/2021, 4:37 PM

## 2021-06-16 NOTE — Progress Notes (Signed)
Subjective:   ON: Patient has nausea and vomiting overnight, was given IV zofran.  Patient's mentation seemed clearer this morning. She requests removal of bilateral wrist restraints and follows all commands during exam. She continues to endorse suprapubic abdominal pain. During encounter patient becomes nauseous with dry heaving and provided with bag. Less orofacial dyskinetic movements this morning though still present.    Objective:  Vital signs in last 24 hours: Vitals:   06/15/21 2257 06/16/21 0325 06/16/21 0500 06/16/21 0706  BP: (!) 140/91   (!) 180/98  Pulse: 94 77  76  Resp: 15 20  14   Temp: (!) 97.4 F (36.3 C) (!) 97.5 F (36.4 C)  98.1 F (36.7 C)  TempSrc: Axillary Oral  Oral  SpO2: 95% 98%  98%  Weight:   63.5 kg   Height:        Intake/Output Summary (Last 24 hours) at 06/16/2021 0847 Last data filed at 06/16/2021 0646 Gross per 24 hour  Intake 120 ml  Output 1000 ml  Net -880 ml    CBC Latest Ref Rng & Units 06/16/2021 06/15/2021 06/14/2021  WBC 4.0 - 10.5 K/uL 24.9(H) 27.2(H) 18.8(H)  Hemoglobin 12.0 - 15.0 g/dL 14.5 14.4 16.6(H)  Hematocrit 36.0 - 46.0 % 40.0 40.2 49.2(H)  Platelets 150 - 400 K/uL 140(L) 189 224    BMP Latest Ref Rng & Units 06/16/2021 06/15/2021 06/14/2021  Glucose 70 - 99 mg/dL 158(H) 144(H) 179(H)  BUN 8 - 23 mg/dL 21 25(H) 27(H)  Creatinine 0.44 - 1.00 mg/dL 1.45(H) 1.65(H) 1.89(H)  Sodium 135 - 145 mmol/L 141 139 137  Potassium 3.5 - 5.1 mmol/L 2.4(LL) 3.6 3.5  Chloride 98 - 111 mmol/L 109 111 108  CO2 22 - 32 mmol/L 21(L) 18(L) 17(L)  Calcium 8.9 - 10.3 mg/dL 11.2(H) 11.8(H) 13.2(HH)   Magnesium [[id number]] (Abnormal) Collected: 06/16/21 0550  Specimen: Blood Updated: 06/16/21 0758   Magnesium 1.6 Low  mg/dL    Technologist smear review [[id number]] Collected: 06/15/21 1316  Specimen: Blood Updated: 06/15/21 1443   WBC MORPHOLOGY MORPHOLOGY UNREMARKABLE   RBC MORPHOLOGY MORPHOLOGY UNREMARKABLE   Tech Review MORPHOLOGY UNREMARKABLE    PTH-related peptide pending   Physical Exam:  General: Appears uncomfortable, elderly appearing white female HENT: normocephalic, atraumatic EYES: tracks examiner, EOM intact all cardinal directions, no scleral icterus CV: tachycardic, normal rhythm, no murmurs, rubs, gallops.  Pulmonary: sating well on RA, lung clear to auscultation, no rales, wheezes, rhonchi Abdominal: non-distended, soft, tenderness to palpation supra pubic area, normal BS Neurological: Patient is awake, alert, oriented to self and place but not time (did not know year). Patient noted to have postural tremor. No resting tremor, no action tremor. Reflexes: brisk 3+ reflexes biceps, patellar, achilles, with few beats of clonus on L foot. Negative babinski bilaterally.  Motor: 5/5 LUE, 5/5 L elbow extension, 4/5 L elbow flexion, LLE 5/5, RLE hip flexion 4/5. Possible bradykinesia bilaterally with loss of amplitude and pauses with finger taps/wrist pronation/supination Possible rigidity most noticeable with R elbow extension   Assessment/Plan:  Principal Problem:   Orthostasis Active Problems:   Syncope   Primary hyperparathyroidism ([place])   Seizure-like activity ([place])  Ms. Melissa Lopez is a 70 yo female with PMHx of CKD stage 3, nonobstructive CAD (cath 11/2020), HTN, SSS s/p pacemaker, hypercalcemia, primary hyperparathyroidism, T2DM with diabetic nephropathy and gastroparesis, who presented to [facility] with shaking and falls. Patient was found to have AKI, hypercalcemia, hyperreflexia and possible UTI. Patient was admitted for further workup and  evaluation of shaking spells.  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  Recent CT scan negative for acute ischemic infarct or ICH. EEG without any evidence of subclinical seizures. MRI cervical and thoracic spine without  compression or lesion. Neurology feels these shaking episodes are likely due to underlying medical condition/dehydration and not a neurological condition. Orthostatics were positive for hypotension with drop in BP systolic greater than 30 (140->98). Given the patients F6BW complications: gastroparesis, nephropathy, urge urinary incontinence, the patient's hypotensive symptoms are likely due to ANS dysfunction 2/2 T2DM. Patient has also been vomiting with fluid losses which could contribute to hypotensive symptoms. Patient received 28mL/hr LR overnight 7/6 and started on 250cc/hr on 7/7. Fluids decreased to 150cc.hr on 7/8. With MCI, signs of autonomic failure, shaking/tremor, possible bradykinesia and rigidity on exam, multiple systems atrophy MSA-P type is on my differential though patient was also on Reglan which can cause parkinsonism.  -Neurology consulted appreciate recs -maintenance fluids 150cc/hr -repeat orthostatics    # Resistant HTN #orthostatic hypotension with supine hypertension Patient has a significant history of hypertension on multiple blood pressure medications including amlodipine 10 mg, carvedilol 6.25 mg twice daily, hydralazine 25 mg nightly, lisinopril 20 mg daily.  Patient's daughter states that the amlodipine and the hydralazine were recently discontinued due to concern for orthostatic hypotension. Patient's BP is elevated to 466Z systolic. Patient will need fluids for hypercalcemia and to replace GI losses though with elevated BP, we are monitoring carefully. -coreg 6.25mg  -amlodipine 10mg  -PRN labetalol with parameters -In the long term, she will likely need a higher systolic BP goal of < 993 due to her orthostatic hypotension and possible autonomic dysfunction.     # Severe Hypercalcemia #hyperreflexia on exam #Hx primary hyperparathyroidism #Hx of constipation, kidney stones Ca 14.1 (hypercalcemic crisis) this admission with history of primary hyperparathyroidism and  hx of hypercalcemia. Per daughter patient's normal calcium is 10-11. She states when patient is vomiting, her calcium will increase as she becomes dehydrated. She has seen surgery for her primary hyperparathyroidism several years ago in [place] and they noted a small nodule on her parathyroid gland and surveillance was recommended. PTH elevated to 100 and vitamin D normal at 91, consistent with primary hyperparathyroidism. Patient's Ca on 7/8 has improved to 11.8. Ultimately patient will need parathyroidectomy given patient meets criteria for surgical intervention: elevated calcium to 14.1 this admission and previous admission in Oct 2021 for hypercalcemia with calcium of 13.5. Additionally, patient has hx of symptoms consistent with hypercalcemia including nausea vomiting, constipation, as well as history of kidney stones and peripheral kidney calcifications on imaging. Daughter approves of plan for surgery to treat long hx of hypercalcemia. Hyperreflexia consistent with hypercalcemia/hypercalcemic crisis per neurology. -150cc/hr IV fluids -Cinacalcet 30mg  BID -follow up PTHrP -24hr urine calcium pending - consult to general surgery: OP general surgery follow up for parathyroidectomy, general surgery will schedule -if patient does not want to proceed with surgery then patient may benefit from starting [place] for calcium management.  #hypokalemia #hyomagnesemia Patient with K of 2.4 of 7/9 repleted 7/9. Patient has mildly low magnesium, repleted 7/9. Patient not on diuretic. Patient received 2 units of insulin on 7/8. Removal of gastric acid can cause increase in bicarb which can increase potassium secretion in the kidneys. However, 7/9 mild metabolic acidosis with bicarb 19. Patient not eating very much, possibly getting not enough potassium  intake. -repeat BMP -potassium repletion -magnesium repletion  #Acute on Chronic Kidney Disease (Stage IIIB # Bilateral Renal cysts and calcifications   #diabetic nephropathy Patient presents with acute elevation in creatinine to 2.51 from a baseline of 1.5 and a GFR of 20 from a baseline of 36.  She appears to have baseline CKD stage IIIb likely secondary to underlying diabetes and hypertension. Random urine sodium and creatinine are normal.  CT abdomen pelvis did not show any acute urinary issues, she does however have multiple bilateral renal cysts and varying size and complexity is which may need further evaluation.  She has received 1 L of IV fluids in the ED and received maintenance fluids. Repeat CMP showed improvement in Cr to 1.65 on 7/8. Treating for UTI as above. Renal US showed no R artery stenosis with 1-59% stenosis of L renal artery as well as multiple cysts, largest 4.3cm and possible calculus. 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 -Nephrology following appreciate recs  -Cinacalcet 30mg  BID until parathyroidectomy -150cc/hr IV fluids -Strict ins and outs to trend urine output -Avoid nephrotoxic medications -Daily kidney function and electrolytes   #UTI #urge urinary incontinence Patient presented with mild leukocytosis of 14 on admission. WBC increased to 18.8 on 7/7. Patient was given a dose of ceftriaxone in the ED due to concern for possible urinary tract infection. UA with large leukocytes and rare bacteria. Patient with supra pubic pain 7/7. Urine culture came back with multiple specimens. Patient has history of overactive bladder and is on oxybutynin at home. On 7/8 worsening leukocytosis to 27.2. -urine culture repeat clean catch pending -ceftriaxone for 10 days (end date 7/15) -oxybutynin for overactive bladder   #Leukocytosis Patient with worsening leukocytosis to 27.2 on 7/8 despite treating UTI. No other signs of infectious sources. Malignancy is on the differential. CT ABDPEL showed a dilated common duct 70mm, and radiology feels this is likely post surgical, general surgery agrees and feels this is post  surgical given normal LFTs, recommended we speak with GI. Prior CTs with stable dilation of duct noted by GI to be greater dilation than would usually be expected post cholecystectomy. Per GI note from previous admission (Dr. Norberto Sorenson), GI felt these findings were consistent with post surgical changes. MRI would further characterize but patient has incompatible pacemaker. NM Parathyroid scan w/ spect in October 2021 negative for parathyroid adenoma. Blood smear unremarkable. Leukocytosis improving.   #Nausea/Vomiting # Hx Cyclical N/V   # Hx of Reflux gastritis  Patient developed nausea and vomiting in the ED. Per daughter, patient often has n/v at home and it occurs when she has not eaten. At home patient will take pepcid with improvement in symptoms. Nausea and vomiting improved on pepcid. Pateint expressed concerns about feeling constipated despite two large Bms overnight and soft non distended abdomen. KUB with normal bowel gas pattern, no radio-opaque calculi. Patient's high calcium of 14 may be contributing to n/v. Patient also has UTI which may be contributing as well. Patient still having n/v. -Pepcid 10mg  daily with prn Zofran 4mg  -Protonix 20mg  daily -150cc/hr IV fluids  #Tardive Dyskinesia  Reglan can cause tardive dyskinesia and this can be irreversible.  -Holding reglan    #Sick sinus syndrome Patient has sick sinus syndrome status post pacemaker.  Recent interrogation was performed in the past several months without any problems.    #T2DM #gastroparesis Patient has a history of type 2 diabetes on Tresiba 5 units daily.  Her last hemoglobin A1c was 6.5.  She does  however have significant complications associated with her diabetes including diabetic nephropathy, overactive bladder and gastroparesis.  She was recently taken off of Reglan due to tardive dyskinesia. -sliding scale insulin and CBG monitoring -erythromycin ethylsuccinate for gastroparesis   #Prior CVA R Thalamic  lacunar stroke Residual left sided numbness per most recent Neurology note (Dr. Andrey Spearman). Patient had previous fall, hitting her left forehead and eye per his note.She has had several falls last few months as well as 60-80 pound weight loss.CTH showed moderate chronic microvascular ischemic change. Old right thalamic lacunar infarct. Significant left periorbital hemorrhage/swelling. No injury to the left globe or postseptal orbit. No fractures. No acute intracranial abnormalities.  -continue aspirin 81mg   #CAD nonobstructive #HLD Patient has a history of hyperlipidemia CAD. Patient had EKG findings several years back concerning for NSTEMI. Cardiac catheterization was done and showed nonobstructive CAD. Patient is currently on rosuvastatin 5 mg daily. -Continue rosuvastatin 5 mg daily  #HFpEF: Patient has a history of heart failure with preserved ejection fraction.  Her last echo was performed in 11/27/2020 and showed an EF of 60 to 65% with grade 1 diastolic dysfunction.  Patient appears volume down on exam.  Furthermore she is not on any diuretic therapy at home  Diet: Heart Healthy VTE: Heparin IVF: 150cc/hr Code: Full  Prior to Admission Living Arrangement: Home, lives with daughter who provides support Anticipated Discharge Location: Home vs SNF Barriers to Discharge: continuing diagnostic workup of shaking spells, hyperreflexia, treating hypercalcemia and AKI Dispo: Anticipated discharge in approximately 2-3 day(s).   Wayland Denis, MD 06/16/2021, 8:47 AM Pager: [phone] After 5pm on weekdays and 1pm on weekends: On Call pager [phone]

## 2021-06-17 LAB — COMPREHENSIVE METABOLIC PANEL
ALT: 25 U/L (ref 0–44)
AST: 27 U/L (ref 15–41)
Albumin: 3.6 g/dL (ref 3.5–5.0)
Alkaline Phosphatase: 69 U/L (ref 38–126)
Anion gap: 8 (ref 5–15)
BUN: 29 mg/dL — ABNORMAL HIGH (ref 8–23)
CO2: 22 mmol/L (ref 22–32)
Calcium: 11.1 mg/dL — ABNORMAL HIGH (ref 8.9–10.3)
Chloride: 110 mmol/L (ref 98–111)
Creatinine, Ser: 1.52 mg/dL — ABNORMAL HIGH (ref 0.44–1.00)
GFR, Estimated: 37 mL/min — ABNORMAL LOW (ref >60)
Glucose, Bld: 137 mg/dL — ABNORMAL HIGH (ref 70–99)
Potassium: 3.4 mmol/L — ABNORMAL LOW (ref 3.5–5.1)
Sodium: 140 mmol/L (ref 135–145)
Total Bilirubin: 1 mg/dL (ref 0.3–1.2)
Total Protein: 6.4 g/dL — ABNORMAL LOW (ref 6.5–8.1)

## 2021-06-17 LAB — GLUCOSE, CAPILLARY
Glucose-Capillary: 175 mg/dL — ABNORMAL HIGH (ref 70–99)
Glucose-Capillary: 154 mg/dL — ABNORMAL HIGH (ref 70–99)
Glucose-Capillary: 107 mg/dL — ABNORMAL HIGH (ref 70–99)
Glucose-Capillary: 205 mg/dL — ABNORMAL HIGH (ref 70–99)

## 2021-06-17 LAB — CBC
HCT: 38.7 % (ref 36.0–46.0)
Hemoglobin: 13.9 g/dL (ref 12.0–15.0)
MCH: 31.7 pg (ref 26.0–34.0)
MCHC: 35.9 g/dL (ref 30.0–36.0)
MCV: 88.4 fL (ref 80.0–100.0)
Platelets: 125 10*3/uL — ABNORMAL LOW (ref 150–400)
RBC: 4.38 MIL/uL (ref 3.87–5.11)
RDW: 12.6 % (ref 11.5–15.5)
WBC: 20.9 10*3/uL — ABNORMAL HIGH (ref 4.0–10.5)
nRBC: 0 % (ref 0.0–0.2)

## 2021-06-17 LAB — CALCIUM, URINE, 24 HOUR
Calcium, 24 hour urine: 550 mg/24 hr — ABNORMAL HIGH (ref 0–320)
Calcium, Ur: 11 mg/dL
Total Volume: 5000

## 2021-06-17 MED ORDER — ENSURE ENLIVE PO LIQD
237.0000 mL | Freq: Two times a day (BID) | ORAL | Status: DC
  Administered 2021-06-18: 237 mL via ORAL

## 2021-06-17 MED ORDER — ERYTHROMYCIN ETHYLSUCCINATE 200 MG/5ML PO SUSR
400.0000 mg | Freq: Three times a day (TID) | ORAL | Status: DC
  Filled 2021-06-17: qty 10

## 2021-06-17 MED ORDER — DICLOFENAC SODIUM 1 % EX GEL
4.0000 g | Freq: Four times a day (QID) | CUTANEOUS | Status: DC
  Administered 2021-06-17 – 2021-06-18 (×4): 4 g via TOPICAL
  Filled 2021-06-17: qty 100

## 2021-06-17 NOTE — Progress Notes (Signed)
Subjective:   ON: Patient has nausea and bilious vomiting overnight as well as this morning without PRNs given.   Patient is lethargic this morning. She denies abdominal pain. She nods when asked if she feel nauseous. She denies headache. She states she feels tired.   Objective:  Vital signs in last 24 hours: Vitals:   06/16/21 2000 06/17/21 0400 06/17/21 0500 06/17/21 0704  BP: 101/72 (!) 166/77  (!) 170/103  Pulse:    79  Resp: 20   14  Temp: 98.4 F (36.9 C) 97.7 F (36.5 C)  97.6 F (36.4 C)  TempSrc: Oral Oral  Oral  SpO2:    98%  Weight:   63.5 kg   Height:        Intake/Output Summary (Last 24 hours) at 06/17/2021 1422 Last data filed at 06/17/2021 0500 Gross per 24 hour  Intake --  Output 2000 ml  Net -2000 ml    CBC Latest Ref Rng & Units 06/17/2021 06/16/2021 06/15/2021  WBC 4.0 - 10.5 K/uL 20.9(H) 24.9(H) 27.2(H)  Hemoglobin 12.0 - 15.0 g/dL 13.9 14.5 14.4  Hematocrit 36.0 - 46.0 % 38.7 40.0 40.2  Platelets 150 - 400 K/uL 125(L) 140(L) 189    BMP Latest Ref Rng & Units 06/17/2021 06/16/2021 06/16/2021  Glucose 70 - 99 mg/dL 137(H) 193(H) 158(H)  BUN 8 - 23 mg/dL 29(H) 24(H) 21  Creatinine 0.44 - 1.00 mg/dL 1.52(H) 1.47(H) 1.45(H)  Sodium 135 - 145 mmol/L 140 139 141  Potassium 3.5 - 5.1 mmol/L 3.4(L) 3.0(L) 2.4(LL)  Chloride 98 - 111 mmol/L 110 109 109  CO2 22 - 32 mmol/L 22 19(L) 21(L)  Calcium 8.9 - 10.3 mg/dL 11.1(H) 11.1(H) 11.2(H)   Calcium, urine, 24 hour [[id number]] (Abnormal) Collected: 06/14/21 0613  Specimen: Urine Updated: 06/17/21 0836   Calcium, Ur 11.0 mg/dL    Calcium, 24 hour urine 550 High  mg/24 hr    Total Volume 5,000   Magnesium [[id number]] Collected: 06/16/21 1912  Specimen: Blood Updated: 06/16/21 2034   Magnesium 2.2 mg/dL    PTH-related peptide: pending   Physical Exam:  General: Appears uncomfortable, elderly appearing white female HENT: normocephalic, atraumatic EYES: tracks examiner, EOM intact all cardinal directions, no  scleral icterus CV: tachycardic, normal rhythm, no murmurs, rubs, gallops.  Pulmonary: sating well on RA, lung clear to auscultation, no rales, wheezes, rhonchi Abdominal: non-distended, soft, non-tender to palpation, normal BS Neurological: Patient is lethargic, alert, oriented to self and place but not time (did not know year). Not oriented to situation. Patient noted to have postural tremor. No resting tremor, no action tremor. Possible bradykinesia bilaterally with loss of amplitude and pauses with finger taps/wrist pronation/supination Possible rigidity most noticeable with R elbow extension   Assessment/Plan:  Principal Problem:   Orthostasis Active Problems:   Syncope   Primary hyperparathyroidism ([place])   Seizure-like activity ([place])  Melissa Lopez is a 70 yo female with PMHx of CKD stage 3, nonobstructive CAD (cath 11/2020), HTN, SSS s/p pacemaker, hypercalcemia, primary hyperparathyroidism, T2DM with diabetic nephropathy and gastroparesis, who presented to [facility] with shaking and falls. Patient was found to have AKI, hypercalcemia, hyperreflexia and possible UTI. Patient was admitted for further workup and evaluation of shaking spells.  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  Recent CT scan negative for acute ischemic infarct or ICH. EEG without any evidence of subclinical  seizures. MRI cervical and thoracic spine without compression or lesion. Neurology feels these shaking episodes are likely due to underlying medical condition/dehydration and not a neurological condition. Orthostatics were positive for hypotension with drop in BP systolic greater than 30 (140->98). Given the patients G2IR complications: gastroparesis, nephropathy, urge urinary incontinence, the patient's hypotensive symptoms are likely due to ANS dysfunction 2/2  T2DM. Patient has also been vomiting with fluid losses which could contribute to hypotensive symptoms. Patient received 15mL/hr LR overnight 7/6 and started on 250cc/hr on 7/7. Fluids decreased to 150cc.hr on 7/8. With MCI, signs of autonomic failure, shaking/tremor, possible bradykinesia and rigidity on exam, multiple systems atrophy MSA-P type is on my differential though less likely since patient was previously on Reglan which can cause parkinsonism.  -Neurology consulted appreciate recs -repeat orthostatics  -PT recommends HH PT, patient well equipped at home with DME   # Resistant HTN #orthostatic hypotension with supine hypertension Patient has a significant history of hypertension on multiple blood pressure medications including amlodipine 10 mg, carvedilol 6.25 mg twice daily, hydralazine 25 mg nightly, lisinopril 20 mg daily.  Patient's daughter states that the amlodipine and the hydralazine were recently discontinued due to concern for orthostatic hypotension. Patient's BP is elevated to 485I systolic. Patient will need fluids for hypercalcemia and to replace GI losses though with elevated BP, we are monitoring carefully. -coreg 6.25mg  -amlodipine 10mg  -PRN labetalol with parameters -In the long term, she will likely need a higher systolic BP goal of < 627 due to her orthostatic hypotension and possible autonomic dysfunction.   -compression stockings when seated and standing after discharge.   # Severe Hypercalcemia #hyperreflexia on exam #Hx primary hyperparathyroidism #Hx of constipation, kidney stones Ca 14.1 (hypercalcemic crisis) this admission with history of primary hyperparathyroidism and hx of hypercalcemia. Per daughter patient's normal calcium is 10-11. She states when patient is vomiting, her calcium will increase as she becomes dehydrated. She has seen surgery for her primary hyperparathyroidism several years ago in [place] and they noted a small nodule on her  parathyroid gland and surveillance was recommended. PTH elevated to 100 and vitamin D normal at 91, consistent with primary hyperparathyroidism. Patient's Ca on 7/8 has improved to 11.8. Ultimately patient will need parathyroidectomy given patient meets criteria for surgical intervention: elevated calcium to 14.1 this admission and previous admission in Oct 2021 for hypercalcemia with calcium of 13.5. Additionally, patient has hx of symptoms consistent with hypercalcemia including nausea vomiting, constipation, as well as history of kidney stones and peripheral kidney calcifications on imaging. Daughter approves of plan for surgery to treat long hx of hypercalcemia. Hyperreflexia consistent with hypercalcemia/hypercalcemic crisis per neurology. 24hr urine calcium with elevated urine calcium consistent with diagnosis. -Cinacalcet 30mg  BID -follow up [place] - consult to general surgery: OP general surgery follow up for parathyroidectomy, general surgery will schedule -if patient does not want to proceed with surgery then patient may benefit from starting [place] for calcium management.   #hypokalemia #hyomagnesemia Patient with K of 2.4 of 7/9 repleted 7/9. Patient has mildly low magnesium, repleted 7/9. Patient not on diuretic. Patient received 2 units of insulin on 7/8. Removal of gastric acid can cause increase in bicarb which can increase potassium secretion in the kidneys. However, 7/9 mild metabolic acidosis with bicarb 19. Patient not eating very much, possibly getting not enough potassium intake. Repeat potassium 3.4 improving. Repeat magnesium 2.2 resolved.  -monitor electrolytes   #Acute on Chronic Kidney Disease (Stage IIIB) # Bilateral Renal cysts and calcifications  #  diabetic nephropathy Patient presents with acute elevation in creatinine to 2.51 from a baseline of 1.5 and a GFR of 20 from a baseline of 36.  She appears to have baseline CKD stage IIIb likely secondary to underlying  diabetes and hypertension. Random urine sodium and creatinine are normal.  CT abdomen pelvis did not show any acute urinary issues, she does however have multiple bilateral renal cysts and varying size and complexity is which may need further evaluation.  She has received 1 L of IV fluids in the ED and received maintenance fluids. Renal function back to baseline. Treating for UTI as above. Renal US showed no R artery stenosis with 1-59% stenosis of L renal artery as well as multiple cysts, largest 4.3cm and possible calculus. 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 -Nephrology following appreciate recs  -Cinacalcet 30mg  BID until parathyroidectomy -Strict ins and outs to trend urine output -Avoid nephrotoxic medications -Daily kidney function and electrolytes   #UTI #urge urinary incontinence Patient presented with mild leukocytosis of 14 on admission. WBC increased to 18.8 on 7/7. Patient was given a dose of ceftriaxone in the ED due to concern for possible urinary tract infection. UA with large leukocytes and rare bacteria. Patient with supra pubic pain 7/7. Urine culture came back with multiple specimens. Patient has history of overactive bladder and is on oxybutynin at home. On 7/8 worsening leukocytosis to 27.2.  Clean catch urine culture no growth. -ceftriaxone for 10 days (end date 7/15) -oxybutynin for overactive bladder   #Leukocytosis #Prior Leukocytosis 20-50 with no clear etiology Patient with worsening leukocytosis to 27.2 on 7/8 despite treating UTI. No other signs of infectious sources. Malignancy is on the differential. CT ABDPEL showed a dilated common duct 83mm, and radiology feels this is likely post surgical, general surgery agrees and feels this is post surgical given normal LFTs. Prior CTs with stable dilation of duct noted by GI to be greater dilation than would usually be expected post cholecystectomy. Per GI note from previous admission (Dr. Norberto Sorenson), GI felt these  findings were consistent with post surgical changes. MRI would further characterize but patient has incompatible pacemaker. NM Parathyroid scan w/ spect in October 2021 negative for parathyroid adenoma. Blood smear unremarkable. Leukocytosis improving. PTHrP pending.   #Nausea/Vomiting # Hx Cyclical N/V   # Hx of Reflux gastritis  Patient developed nausea and vomiting in the ED. Per daughter, patient often has n/v at home and it occurs when she has not eaten. At home patient will take pepcid with improvement in symptoms. Nausea and vomiting improved on pepcid. Pateint expressed concerns about feeling constipated despite two large Bms overnight and soft non distended abdomen. KUB with normal bowel gas pattern, no radio-opaque calculi. Patient's hypercalcemia may be contributing to n/v. Patient also has UTI which may be contributing. Patient still having n/v overnight but has not had further vomiting after stopping erythromycin. -Pepcid 20mg  daily with prn Zofran 4mg  -Protonix 20mg  daily   #Tardive Dyskinesia  Reglan can cause tardive dyskinesia and this can be irreversible. Can also cause parkinsonism which was noted on exam. Patient with bradykinesia bilaterally with loss of amplitude and pauses with finger taps/wrist pronation/supination and rigidity most noticeable with L elbow extension. Symptoms may take months to wear off. Reglan was discontinued one month ago.   -Holding reglan    #Sick sinus syndrome Patient has sick sinus syndrome status post pacemaker.  Recent interrogation was performed in the past several months without any problems.    #T2DM #gastroparesis  Patient has a history of type 2 diabetes on Tresiba 5 units daily.  Her last hemoglobin A1c was 6.5.  She does however have significant complications associated with her diabetes including diabetic nephropathy, overactive bladder and gastroparesis.  She was recently taken off of Reglan due to tardive dyskinesia. -sliding scale  insulin and CBG monitoring - d/c on 7/10 erythromycin ethylsuccinate for gastroparesis, concern patient having nausea side effect.   #Prior CVA R Thalamic lacunar stroke Residual left sided numbness per most recent Neurology note (Dr. Andrey Spearman). Patient had previous fall, hitting her left forehead and eye per his note.She has had several falls last few months as well as 60-80 pound weight loss.CTH showed moderate chronic microvascular ischemic change. Old right thalamic lacunar infarct. Significant left periorbital hemorrhage/swelling. No injury to the left globe or postseptal orbit. No fractures. No acute intracranial abnormalities.  -continue aspirin 81mg   #CAD nonobstructive #HLD Patient has a history of hyperlipidemia CAD. Patient had EKG findings several years back concerning for NSTEMI. Cardiac catheterization was done and showed nonobstructive CAD. Patient is currently on rosuvastatin 5 mg daily. -Continue rosuvastatin 5 mg daily  #HFpEF: Patient has a history of heart failure with preserved ejection fraction.  Her last echo was performed in 11/27/2020 and showed an EF of 60 to 65% with grade 1 diastolic dysfunction.  Patient euvolemic on exam.  Not currently on any diuretic therapy at home.  Diet: Heart Healthy VTE: Lovenox IVF: none Code: Full  Prior to Admission Living Arrangement: Home, lives with daughter who provides support Anticipated Discharge Location: Home vs SNF Barriers to Discharge: continuing diagnostic workup of shaking spells, hyperreflexia, treating hypercalcemia and AKI Dispo: Anticipated discharge in approximately 2-3 day(s).   Wayland Denis, MD 06/17/2021, 2:22 PM Pager: [phone] After 5pm on weekdays and 1pm on weekends: On Call pager [phone]

## 2021-06-17 NOTE — Progress Notes (Signed)
[facility] Progress Note    Assessment/ Plan:   AKI on CKD3b, improved. Non-oliguric -underlying CKD likely related to h/o AKI, hypercalcemia, HTN, and DM. CT personally reviewed and she does have multiple cysts, difficult to count but suspecting she many have underlying PCKD? Can evaluate for this as an outpatient -Cr stable 1.5 today with IVF. Baseline Cr seems to hover around 2 -c/w IVF -Avoid nephrotoxic medications including NSAIDs and iodinated intravenous contrast exposure unless the latter is absolutely indicated.  Preferred narcotic agents for pain control are hydromorphone, fentanyl, and methadone. Morphine should not be used. Avoid Baclofen and avoid oral sodium phosphate and magnesium citrate based laxatives / bowel preps. Continue strict Input and Output monitoring. Will monitor the patient closely with you and intervene or adjust therapy as indicated by changes in clinical status/labs   Hypercalcemia (improving), h/o primary hyperparathyroidism. -c/w IVF and calcitonin. PTH drawn 7/7-100. Calcium improving, down to 11.1 -PTHrP pending from 7/8 for completion of work up -sensipar 30mg  bid added 7/8 -overall needs a parathyroidectomy, plan for outpatient parathyroidectomy, patient is willing to proceed forward  Syncope, orthostatic hypotension -autonomic dysfunction 2/2 DM? but was noted to have hyperreflexia on admit -per neuro and primary. Monitor orthostats regularly, on BB+CCB  H/o HTN -on amlodipine and coreg. Orthostatic hypotension as above so will need to carefully adjust meds  DM2 with hyperglycemia -mgmt per primary service  Will sign off from a nephrology perspective. Will need outpatient follow up. She resides in [place] so will try to get her set up at our office there. Please let us know when she is closer to discharge so we can arrange for follow up. Please call with any questions/concerns.  Melissa Quint, MD [facility]  Subjective:   No acute events. More conversant with clearer mentation today. She does report feeling dehydrated and some abd pain. Cr down to 1.5 today, cal 11.2.   Objective:   BP (!) 170/103 (BP Location: Right Arm)   Pulse 79   Temp 97.6 F (36.4 C) (Oral)   Resp 14   Ht 5\' 5"  (1.651 m)   Wt 63.5 kg   SpO2 98%   BMI 23.30 kg/m   Intake/Output Summary (Last 24 hours) at 06/17/2021 1033 Last data filed at 06/17/2021 0500 Gross per 24 hour  Intake --  Output 2000 ml  Net -2000 ml   Weight change: 0.001 kg  Physical Exam: Gen:nad CVS:dry mucosal membranes Resp:s1s2, rrr Abd:cta bl Ext:no edema Neuro: speech clear and coherent, moves all ext sponteneously, intermittently tremulous  Imaging: No results found.  Labs: BMET Recent Labs  Lab 06/13/21 1752 06/14/21 0908 06/14/21 1422 06/15/21 0037 06/16/21 0243 06/16/21 0940 06/17/21 0208  NA 140 137 137 139 141 139 140  K 4.1 3.5 3.5 3.6 2.4* 3.0* 3.4*  CL 110 104 108 111 109 109 110  CO2 21* 18* 17* 18* 21* 19* 22  GLUCOSE 106* 265* 179* 144* 158* 193* 137*  BUN 21 25* 27* 25* 21 24* 29*  CREATININE 1.86* 2.03* 1.89* 1.65* 1.45* 1.47* 1.52*  CALCIUM 12.7* 14.1* 13.2* 11.8* 11.2* 11.1* 11.1*  PHOS 2.2*  --   --   --   --   --   --    CBC Recent Labs  Lab 06/12/21 1539 06/13/21 1752 06/14/21 0353 06/15/21 0438 06/16/21 0243 06/17/21 0208  WBC 14.1* 12.2* 18.8* 27.2* 24.9* 20.9*  NEUTROABS 8.0* 8.1*  --   --   --   --  HGB 13.4 14.7 16.6* 14.4 14.5 13.9  HCT 40.2 44.0 49.2* 40.2 40.0 38.7  MCV 93.9 93.0 94.1 88.0 86.2 88.4  PLT 220 230 224 189 140* 125*    Medications:     amLODipine  10 mg Oral Daily   aspirin  81 mg Oral Daily   carvedilol  6.25 mg Oral BID WC   Chlorhexidine Gluconate Cloth  6 each Topical Daily   cinacalcet  30 mg Oral BID WC   enoxaparin (LOVENOX) injection  40 mg Subcutaneous Q24H   escitalopram  20 mg Oral Daily   famotidine  20 mg Oral Daily   gabapentin   100 mg Oral TID   insulin aspart  0-9 Units Subcutaneous TID WC   oxybutynin  10 mg Oral Daily   pantoprazole  20 mg Oral Daily   rosuvastatin  5 mg Oral Daily   traZODone  50 mg Oral QHS      Melissa Quint, MD [facility] 06/17/2021, 10:33 AM

## 2021-06-18 ENCOUNTER — Other Ambulatory Visit (HOSPITAL_COMMUNITY): Payer: Self-pay

## 2021-06-18 LAB — CBC
HCT: 38.2 % (ref 36.0–46.0)
Hemoglobin: 13.6 g/dL (ref 12.0–15.0)
MCH: 31.5 pg (ref 26.0–34.0)
MCHC: 35.6 g/dL (ref 30.0–36.0)
MCV: 88.4 fL (ref 80.0–100.0)
Platelets: 136 10*3/uL — ABNORMAL LOW (ref 150–400)
RBC: 4.32 MIL/uL (ref 3.87–5.11)
RDW: 12.6 % (ref 11.5–15.5)
WBC: 19.3 10*3/uL — ABNORMAL HIGH (ref 4.0–10.5)
nRBC: 0 % (ref 0.0–0.2)

## 2021-06-18 LAB — GLUCOSE, CAPILLARY
Glucose-Capillary: 126 mg/dL — ABNORMAL HIGH (ref 70–99)
Glucose-Capillary: 312 mg/dL — ABNORMAL HIGH (ref 70–99)
Glucose-Capillary: 71 mg/dL (ref 70–99)

## 2021-06-18 LAB — COMPREHENSIVE METABOLIC PANEL
ALT: 23 U/L (ref 0–44)
AST: 31 U/L (ref 15–41)
Albumin: 3 g/dL — ABNORMAL LOW (ref 3.5–5.0)
Alkaline Phosphatase: 59 U/L (ref 38–126)
Anion gap: 6 (ref 5–15)
BUN: 37 mg/dL — ABNORMAL HIGH (ref 8–23)
CO2: 26 mmol/L (ref 22–32)
Calcium: 10.8 mg/dL — ABNORMAL HIGH (ref 8.9–10.3)
Chloride: 108 mmol/L (ref 98–111)
Creatinine, Ser: 1.86 mg/dL — ABNORMAL HIGH (ref 0.44–1.00)
GFR, Estimated: 29 mL/min — ABNORMAL LOW (ref >60)
Glucose, Bld: 111 mg/dL — ABNORMAL HIGH (ref 70–99)
Potassium: 3.4 mmol/L — ABNORMAL LOW (ref 3.5–5.1)
Sodium: 140 mmol/L (ref 135–145)
Total Bilirubin: 0.8 mg/dL (ref 0.3–1.2)
Total Protein: 6 g/dL — ABNORMAL LOW (ref 6.5–8.1)

## 2021-06-18 LAB — MAGNESIUM: Magnesium: 2 mg/dL (ref 1.7–2.4)

## 2021-06-18 MED ORDER — PANTOPRAZOLE SODIUM 20 MG PO TBEC: 20.0000 mg | DELAYED_RELEASE_TABLET | Freq: Every day | ORAL | 0 refills | Status: DC

## 2021-06-18 MED ORDER — FAMOTIDINE 20 MG PO TABS: 20.0000 mg | ORAL_TABLET | Freq: Every day | ORAL | 0 refills | Status: DC

## 2021-06-18 MED ORDER — ENOXAPARIN SODIUM 30 MG/0.3ML IJ SOSY
30.0000 mg | PREFILLED_SYRINGE | INTRAMUSCULAR | Status: DC
  Administered 2021-06-18: 30 mg via SUBCUTANEOUS
  Filled 2021-06-18: qty 0.3

## 2021-06-18 MED ORDER — CINACALCET HCL 30 MG PO TABS: 30.0000 mg | ORAL_TABLET | Freq: Two times a day (BID) | ORAL | 1 refills | Status: DC

## 2021-06-18 MED ORDER — POTASSIUM CHLORIDE 10 MEQ/100ML IV SOLN
10.0000 meq | INTRAVENOUS | Status: AC
  Administered 2021-06-18 (×6): 10 meq via INTRAVENOUS
  Filled 2021-06-18 (×6): qty 100

## 2021-06-18 MED ORDER — NITROFURANTOIN MONOHYD MACRO 100 MG PO CAPS: 100.0000 mg | ORAL_CAPSULE | Freq: Two times a day (BID) | ORAL | 0 refills | Status: AC

## 2021-06-18 NOTE — Discharge Instructions (Signed)
You were hospitalized for shaking and fainting, found to be from damage caused from longstanding diabetes. Thank you for allowing [place] to be part of your care.   Please note these changes made to your medications:    Please make sure to follow-up with your primary care doctor.  Continue taking Cinacalcet and you will need a appointment with a surgeon for your parathyroid.  Use compression stockings and abdominal binder daily to help with shaking and fainting.

## 2021-06-18 NOTE — TOC Progression Note (Signed)
Transition of Care [facility]) - Progression Note    Patient Details  Name: Melissa Lopez MRN: [id number] Date of Birth: 26-Feb-1951  Transition of Care [facility]) CM/SW Contact  Vinie Sill, [place] Phone Number: 06/18/2021, 11:38 AM  Clinical Narrative:     CSW updated [facility] [place] # [phone], please call if patient needs transportation at discharge.  Thurmond Butts, MSW, LCSW Clinical Social Worker         Expected Discharge Plan and Services                                                 Social Determinants of Health (SDOH) Interventions    Readmission Risk Interventions Readmission Risk Prevention Plan 11/17/2020  Transportation Screening Complete  PCP or Specialist Appt within 3-5 Days Complete  HRI or [place] Complete  Social Work Consult for [place] Planning/Counseling Complete  Palliative Care Screening Not Applicable  Medication Review [employer]) Complete

## 2021-06-18 NOTE — Discharge Summary (Signed)
Name: Melissa Lopez MRN: [id number] DOB: 31-Oct-1951 70 y.o. PCP: [facility]  Date of Admission: 06/12/2021  3:30 PM Date of Discharge: 06/18/2021 Attending Physician: Velna Ochs, MD  Discharge Diagnosis: 1. Convulsive Syncope 2. Diabetic Autonomic Neuropathy 3. Severe Hypercalcemia 4. Primary Hyperparathyroidism 5. Hypokalemia    Discharge Medications: Allergies as of 06/18/2021       Reactions   Contrast Media [iodinated Diagnostic Agents] Swelling   Contrast Dye - the one made out of shellfish, gives a really bad reaction - swelling   Oxycodone-acetaminophen Anaphylaxis   Xarelto  [rivaroxaban] Swelling   Oxycodone Other (See Comments)   Statused by Person:  EAST, CAMELIA(CE) on [phone]        Medication List     STOP taking these medications    buPROPion 150 MG 12 hr tablet Commonly known as: WELLBUTRIN SR   lisinopril 20 MG tablet Commonly known as: ZESTRIL   metoCLOPramide 5 MG tablet Commonly known as: Reglan   polyethylene glycol 17 g packet Commonly known as: MIRALAX / GLYCOLAX       TAKE these medications    (feeding supplement) PROSource Plus liquid Take 30 mLs by mouth daily.   acetaminophen 325 MG tablet Commonly known as: TYLENOL Take 650 mg by mouth 2 (two) times daily.   amLODipine 10 MG tablet Commonly known as: NORVASC TAKE 1 TABLET BY MOUTH EVERY DAY   aspirin 81 MG chewable tablet Chew 1 tablet (81 mg total) by mouth daily.   carvedilol 6.25 MG tablet Commonly known as: COREG Take 6.25 mg by mouth 2 (two) times daily with a meal.   Cholecalciferol 1.25 MG (50000 UT) capsule Take 50,000 Units by mouth every Sunday.   cinacalcet 30 MG tablet Commonly known as: SENSIPAR Take 1 tablet (30 mg total) by mouth 2 (two) times daily with a meal.   cyclobenzaprine 5 MG tablet Commonly known as: FLEXERIL Take 5 mg by mouth at bedtime.   docusate sodium 100 MG capsule Commonly  known as: COLACE Take 100 mg by mouth 2 (two) times daily.   escitalopram 20 MG tablet Commonly known as: LEXAPRO Take 20 mg by mouth daily.   famotidine 20 MG tablet Commonly known as: PEPCID Take 1 tablet (20 mg total) by mouth daily. What changed: when to take this   gabapentin 100 MG capsule Commonly known as: NEURONTIN Take 100 mg by mouth 3 (three) times daily.   HYDROcodone-Acetaminophen 5-300 MG Tabs Take 1 tablet by mouth 2 (two) times daily.   multivitamin with minerals Tabs tablet Take 1 tablet by mouth daily.   nitrofurantoin (macrocrystal-monohydrate) 100 MG capsule Commonly known as: Macrobid Take 1 capsule (100 mg total) by mouth 2 (two) times daily for 5 days.   ondansetron 4 MG tablet Commonly known as: ZOFRAN Take 1 tablet (4 mg total) by mouth every 6 (six) hours.   oxybutynin 5 MG 24 hr tablet Commonly known as: DITROPAN-XL Take 5 mg by mouth daily.   pantoprazole 20 MG tablet Commonly known as: PROTONIX Take 1 tablet (20 mg total) by mouth daily. Start taking on: June 19, 2021   rosuvastatin 5 MG tablet Commonly known as: CRESTOR Take one tablet daily What changed:  how much to take how to take this when to take this additional instructions   traZODone 50 MG tablet Commonly known as: DESYREL Take 50 mg by mouth at bedtime.   Tyler Aas FlexTouch 100 UNIT/ML FlexTouch Pen Generic drug: insulin degludec Inject  5 Units into the skin daily.        Disposition and follow-up:   Ms.Shaiann Mizrachi was discharged from [facility] in Stable condition.  At the hospital follow up visit please address:  1. Convulsive Syncope 2. Diabetic Autonomic Neuropathy  3. Severe Hypercalcemia 2/2 Primary Hyperparathyroidism 4. Severe HTN 5. Hypokalemia  2.  Labs / imaging needed at time of follow-up: electrolytes, calcium  3.  Pending labs/ test needing follow-up: PTHrp, aldosterone, aldosterone renin ratio  Follow-up  Appointments:  Follow-up Information     Armandina Gemma, MD. Call in 3 week(s).   Specialty: General Surgery Why: We are working hard to arrange an appointment for you in our clinic. Please call in 1 week to confirm your appointment date and time Contact information: [place] [id number] [phone]         [facility] [place]. Call in 1 week(s).   Why: Please call to set up an appointment within one week with your primary care provider to follow up after hospital admisson. Contact information: [address] [place] [address] [place] [facility] Course by problem list:  Convulsive syncope Orthostatic hypotension hx of frequent falls Diabetic autonomic neuropathy Ms. Melissa Lopez is a 70 yo female with PMHx of CKD stage 3, nonobstructive CAD (cath 11/2020), HTN, SSS s/p pacemaker, hypercalcemia, primary hyperparathyroidism, T2DM with diabetic nephropathy and gastroparesis, who presented to [facility] with shaking and falls. Patient was found to have AKI, hypercalcemia, hyperreflexia and possible UTI. Patient was admitted for further workup and evaluation of shaking spells. Recent CT scan negative for acute ischemic infarct or ICH. EEG without any evidence of subclinical seizures. MRI cervical and thoracic spine without compression or lesion. Neurology feels these shaking episodes are likely due to underlying medical condition/dehydration and not a neurological condition. Orthostatics were positive for hypotension with drop in BP systolic greater than 30 (140->98). Given the patients K4YJ complications (gastroparesis, nephropathy, urge urinary incontinence), the patient's hypotensive symptoms are likely due to ANS dysfunction 2/2 T2DM. Patient had also been vomiting with fluid losses which could contribute to hypotensive symptoms. Patient received 84mL/hr LR overnight 7/6 and started on 250cc/hr on 7/7. Fluids  decreased to 150cc/hr on 7/8. Fluids were held on 7/9. Repeat orthostatics with drop in BP systolic from 856/31 to 497/02 which is an improvement from prior. Discussed with patient's daughter how dysfunction of autonomic nervous system 2/2 diabetes can make changing positions difficult for patients given low BP when standing and elevated BP when lying down. Patient will benefit from abdominal binder and compression stockings during the day. Daughter understands that given her severe orthostatic hypotension symptoms that the patient's long term BP goals may need to be a little higher than our usual BP goal since benefit of improving hypotension symptoms will outweigh the long term benefit of keeping blood at usual BP goal. Patient deemed stable for discharge. Patient discharged on 7/11. Patient will follow up with PCP for long term blood pressure control.    Severe Hypercalcemia Hx primary hyperparathyroidism Patient found to be hypercalcemic at admission. Peak Ca 14.1 (hypercalcemic crisis) this admission with history of primary hyperparathyroidism and hx of hypercalcemia, kidney stones, constipation. Patient had hyperreflexia on exam consistent with severe hypercalcemia/hypercalcemic crisis per neurology. Per daughter patient's normal calcium is chronically 10-11. She states when patient is vomiting, her calcium will  increase as she becomes dehydrated. She has seen surgery for her primary hyperparathyroidism several years ago in [place] and they noted a small nodule on her parathyroid gland and surveillance was recommended. PTH was elevated to 100 and vitamin D normal at 91, consistent with primary hyperparathyroidism. Patient received IV fluids to treat hypercalcemia and hypovolemia. Nephrology was consulted and started patient on cinacalcet 30mg  BID to help lower serum Ca. Ultimately patient will need parathyroidectomy given patient meets criteria for surgical intervention: elevated calcium to 14.1 this  admission and previous admission in Oct 2021 for hypercalcemia with calcium of 13.5. Additionally, patient has hx of symptoms consistent with hypercalcemia including nausea vomiting, constipation, as well as history of kidney stones and peripheral kidney calcifications on imaging. Daughter approves of plan for surgery to treat long hx of hypercalcemia. Patient's Ca has improved to 11.1 on day of discharge. Patient will follow up with general surgery to pursue parathyroidectomy. PTHrP still processing. Patient will need to follow up with PCP for management of primary hyperparathyroidism.     Multifocal Encephalopathy Patient developed altered mental status likely secondary to multiple factors including: UTI, hypercalcemia, hypovolemia, hospital induced delirium in a patient with MCI at baseline. Patient came to the ED at her normal mental status baseline and preceded to become confused upon admission to medicine service, pulling out IVs and attempting to get out of bed to use restroom despite foley in place and redirection by staff. As patient's hypercalcemia and UTI were treated, patient's mental status has improved. She was alert and oriented to person, place and situation. She was back to her mental status baseline at time of discharge.    Resistant HTN Orthostatic hypotension with supine hypertension 2/2 diabetic autonomic neuropathy Patient has a significant history of hypertension on multiple blood pressure medications including amlodipine 10 mg, carvedilol 6.25 mg twice daily, hydralazine 25 mg nightly, lisinopril 20 mg daily.  Patient's daughter states that the amlodipine and the hydralazine were recently discontinued due to concern for orthostatic hypotension. Patient's BP is elevated to 098J systolic at admission. Bps intermittently increased to 191Y systolic this admission. We restarted the patient on her home Coreg 6.25 and amlodipine 10mg . Renal dopplers without evidence of severe RAS.  Aldosterone and aldo/renin ratio pending. Likely she will need a higher systolic BP goal of <782 due to her symptomatic hypotension. Patient will follow up with PCP for long term BP management.   Nausea/Vomiting Hx Cyclical N/V   Hx of Reflux gastritis  Patient developed nausea and vomiting in the ED. Per daughter, patient often has n/v at home and it occurs when she has not eaten. At home patient will take pepcid with improvement in symptoms. Nausea and vomiting improved on pepcid, PPI and zofran PRN. Patient expressed concerns about feeling constipated despite two large Bms overnight and soft non distended abdomen. KUB with normal bowel gas pattern, no radio-opaque calculi. Patient's high calcium of 14 likely contributed to n/v. Patient was given erythromycin for gastroparesis which was discontinued for concern that the medication ewas worsening her nausea. Patient also diagnosed with UTI which was likely contributing to nausea symptoms. On day of discharge patient's PO intake is back to baseline and she has not had emesis for 24hrs.   UTI urge urinary incontinence Patient presented with mild leukocytosis of 14 on admission. WBC increased to 18.8 on 7/7. Patient was given a dose of ceftriaxone in the ED due to concern for possible urinary tract infection. UA with large leukocytes and rare bacteria.  Patient with supra pubic pain day after admission 7/7. Urine culture came back with multiple specimens. Patient has history of overactive bladder and is on oxybutynin at home. Patient was started on ceftriaxone for total course of 10 days. Patient completed 6 days of IV antibiotics and will be discharged on PO antibiotics for 4 more days. Patient will follow up with her PCP.    Leukocytosis of unclear etiology Hx Leukocytosis 20-50 with no clear etiology Patient with worsening leukocytosis to 27.2 on 7/8 despite treating UTI. No other signs of infectious sources. Malignancy is on the differential. CT  ABDPEL showed a dilated common duct 64mm, and radiology feels this is likely post surgical, general surgery agrees and feels this is post surgical given normal LFTs. Prior CTs with stable dilation of duct noted by GI to be greater dilation than would usually be expected post cholecystectomy. Per GI note from previous admission (Dr. Norberto Sorenson), GI felt these findings were consistent with post surgical changes. MRI would further characterize but patient has incompatible pacemaker. NM Parathyroid scan w/ spect in October 2021 negative for parathyroid adenoma. Blood smear unremarkable. PTHrP pending. Leukocytosis improving.  Etiology of leukocytosis remains unclear. We would encourage additional workup in an outpatient setting if there is high suspicion for malignancy.    Hypokalemia Hyomagnesemia Patient with K of 2.4 of 7/9 repleted 7/9. Patient has mildly low magnesium, repleted 7/9. Patient not on diuretic. Patient received 2 units of insulin on 7/8. Removal of gastric acid can cause increase in bicarb which can increase potassium secretion in the kidneys. However, 7/9 mild metabolic acidosis with bicarb 19. Patient not eating very much, possibly getting not enough potassium intake. Repeat magnesium 2.2, resolved. Patient's potassium increased to 3.4. Prior to discharge patient received additional 60 meq of potassium to fully replete potassium. Patient will follow up with PCP to monitor electrolytes.    Acute on Chronic Kidney Disease (Stage IIIB) Bilateral Renal cysts and calcifications on imaging Hx diabetic nephropathy Patient presents with acute elevation in creatinine to 2.51 from a baseline of 1.5 and a GFR of 20 from a baseline of 36.  She appears to have baseline CKD stage IIIb likely secondary to underlying diabetes and hypertension. Random urine sodium and creatinine are normal.  CT abdomen pelvis did not show any acute urinary issues, she does however have multiple bilateral renal cysts and  varying size and complexity which may need further evaluation in an outpatient setting. She received 1 L of IV fluids in the ED and received maintenance fluids while admitted to medicine service. Treating for UTI as above. Renal US showed no R artery stenosis with 1-59% stenosis of L renal artery as well as multiple cysts, largest 4.3cm and possible calculus. Nephrology suspects she may have underlying PCKD. Renal function back to baseline on day of discharge (Cr 1.86). Patient will follow up with nephrology.   Tardive Dyskinesia  Patient had been on Reglan for gastroparesis but this medication was discontinued one month prior to admission due to development of orofacial dyskinesias. Reglan can cause tardive dyskinesia and this can be irreversible. Can also cause parkinsonism which was noted on exam this admission. Patient had bradykinesia bilaterally with loss of amplitude and pauses with finger taps/wrist pronation/supination and rigidity most noticeable with L elbow extension. Per case review, symptoms may take months to wear off. Reglan was avoided this admission. Patient should continue to stay off this medication after discharge.    T2DM Gastroparesis Patient has a history of type 2  diabetes on Tresiba 5 units daily.  Her last hemoglobin A1c was 6.5.  She does however have significant complications associated with her diabetes including diabetic nephropathy, overactive bladder and gastroparesis.  She was recently taken off of Reglan due to tardive dyskinesia. Patient was put on SSI with CGB monitoring. Patient was started on erythromycin for gastroparesis while inpatient but this medication was possibly contributing to patient's nausea and therefore it was discontinued.  Sick sinus syndrome Patient has sick sinus syndrome status post pacemaker.  Recent interrogation was performed in the past several months without any problems.  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 Andrey Spearman). Patient was continued on home aspirin 81mg .   CAD nonobstructive HLD Patient has a history of hyperlipidemia CAD. Patient had EKG findings several years back concerning for NSTEMI. Cardiac catheterization was done and showed nonobstructive CAD. Patient was continued on home rosuvastatin 5 mg daily.  HFpEF Patient has a history of heart failure with preserved ejection fraction.  Her last echo was performed in 11/27/2020 and showed an EF of 60 to 65% with grade 1 diastolic dysfunction.  Patient euvolemic on exam.  Not currently on any diuretic therapy at home.   Subjective on day of discharge:  Patient reports she is not doing well because she is having pain in her L hip. Patient was notified that she has a Volteran cream she can ask nursing to apply. Patient was able to eat some of her breakfast this morning with no further vomiting, nausea, or abdominal pain. Her mentation seemed much clearer, she was oriented to situation and what brought her to the hospital. She told us about her grandchildren and about her baseline mobility at home.    Discharge Exam:   BP 124/83   Pulse 68   Temp 98.6 F (37 C) (Oral)   Resp 15   Ht 5\' 5"  (1.651 m)   Wt 63.5 kg   SpO2 97%   BMI 23.30 kg/m   Physical Exam: General: Well appearing caucasian female, appears older than stated age, NAD HENT: normocephalic, atraumatic EYES: conjunctiva non-erythematous, no scleral icterus CV: regular rate, normal rhythm, no murmurs, rubs, gallops. Pulmonary: sating well on RA, lung clear to auscultation, no rales, wheezes, rhonchi Abdominal: non-distended, soft, non-tender to palpation, normal BS Skin: Warm and dry, no rashes or lesions Neurological: MS: awake, alert and oriented x3, normal speech and fund of knowledge Motor: moves all extremities antigravity Psych: normal affect   Pertinent Labs, Studies, and Procedures:  CBC Latest Ref Rng & Units  06/18/2021 06/17/2021 06/16/2021  WBC 4.0 - 10.5 K/uL 19.3(H) 20.9(H) 24.9(H)  Hemoglobin 12.0 - 15.0 g/dL 13.6 13.9 14.5  Hematocrit 36.0 - 46.0 % 38.2 38.7 40.0  Platelets 150 - 400 K/uL 136(L) 125(L) 140(L)   BMP Latest Ref Rng & Units 06/18/2021 06/17/2021 06/16/2021  Glucose 70 - 99 mg/dL 111(H) 137(H) 193(H)  BUN 8 - 23 mg/dL 37(H) 29(H) 24(H)  Creatinine 0.44 - 1.00 mg/dL 1.86(H) 1.52(H) 1.47(H)  Sodium 135 - 145 mmol/L 140 140 139  Potassium 3.5 - 5.1 mmol/L 3.4(L) 3.4(L) 3.0(L)  Chloride 98 - 111 mmol/L 108 110 109  CO2 22 - 32 mmol/L 26 22 19(L)  Calcium 8.9 - 10.3 mg/dL 10.8(H) 11.1(H) 11.1(H)   Magnesium [[id number]] Collected: 06/18/21 0303  Specimen: Blood Updated: 06/18/21 0825   Magnesium 2.0 mg/dL    Calcium, urine, 24 hour [[id number]] (Abnormal) Collected: 06/14/21 0613  Specimen: Urine  Updated: 06/17/21 6045   Calcium, Ur 11.0 mg/dL    Calcium, 24 hour urine 550 High  mg/24 hr    Total Volume 5,000  CT ABDOMEN PELVIS WO CONTRAST  Result Date: 06/13/2021 CLINICAL DATA:  Trauma/fall, back pain EXAM: CT CHEST, ABDOMEN AND PELVIS WITHOUT CONTRAST TECHNIQUE: IMPRESSION: No evidence of traumatic injury to the chest, abdomen, or pelvis. Status post cholecystectomy. Dilated common duct, measuring 17 mm, although smoothly tapering at the ampulla, likely postsurgical. Numerous bilateral renal cysts of varying sizes and complexity, with scattered peripheral calcifications, poorly evaluated unenhanced CT but grossly unchanged. Dedicated lumbar spine CT was imaged and will be reported separately. Electronically Signed   By: Julian Hy M.D.   On: 06/13/2021 03:41   CT Head Wo Contrast  Result Date: 06/13/2021 CLINICAL DATA:  Head trauma.  Fall. EXAM: CT HEAD WITHOUT CONTRAST CT MAXILLOFACIAL WITHOUT CONTRAST CT CERVICAL SPINE WITHOUT CONTRAST TECHNIQUE: IMPRESSION: 1. Chronic ischemic microangiopathy and generalized atrophy without acute intracranial abnormality. 2. No facial  fracture. 3. No acute fracture or static subluxation of the cervical spine. Electronically Signed   By: Ulyses Jarred M.D.   On: 06/13/2021 03:45   CT Chest Wo Contrast  Result Date: 06/13/2021 CLINICAL DATA:  Trauma/fall, back pain EXAM: CT CHEST, ABDOMEN AND PELVIS WITHOUT CONTRAST TECHNIQUE: IMPRESSION: No evidence of traumatic injury to the chest, abdomen, or pelvis. Status post cholecystectomy. Dilated common duct, measuring 17 mm, although smoothly tapering at the ampulla, likely postsurgical. Numerous bilateral renal cysts of varying sizes and complexity, with scattered peripheral calcifications, poorly evaluated unenhanced CT but grossly unchanged. Dedicated lumbar spine CT was imaged and will be reported separately. Electronically Signed   By: Julian Hy M.D.   On: 06/13/2021 03:41   CT CERVICAL SPINE WO CONTRAST  Result Date: 06/13/2021 CLINICAL DATA:  Head trauma.  Fall. EXAM: CT HEAD WITHOUT CONTRAST CT MAXILLOFACIAL WITHOUT CONTRAST CT CERVICAL SPINE WITHOUT CONTRAST :IMPRESSION: 1. Chronic ischemic microangiopathy and generalized atrophy without acute intracranial abnormality. 2. No facial fracture. 3. No acute fracture or static subluxation of the cervical spine. Electronically Signed   By: Ulyses Jarred M.D.   On: 06/13/2021 03:45   CT L-SPINE NO CHARGE  Result Date: 06/13/2021 CLINICAL DATA:  Fall EXAM: CT LUMBAR SPINE WITHOUT CONTRAST  IMPRESSION: 1. No acute fracture or static subluxation of the lumbar spine. 2. L3-4 moderate spinal canal stenosis and moderate right and mild left neural foraminal stenosis. 3. Severe bilateral L5-S1 neural foraminal stenosis. Electronically Signed   By: Ulyses Jarred M.D.   On: 06/13/2021 03:38   EEG adult  Result Date: 06/13/2021 Lora Havens, MD     06/13/2021 10:50 AM Patient Name: Melissa Lopez MRN: [id number] Epilepsy Attending: Lora Havens Referring Physician/Provider: Dr Donnetta Simpers Date: 06/13/2021 Duration: 23.08 mins  Patient history: 70 year old female who presents to the ED for episode of shaking -> stiffness and eyes roll back --> fall and return to her baseline all in 10 secs. EEG to evaluate for seizures. Level of alertness: Awake AEDs during EEG study: None Technical aspects: This EEG study was done with scalp electrodes positioned according to the 10-20 International system of electrode placement. Electrical activity was acquired at a sampling rate of 500Hz  and reviewed with a high frequency filter of 70Hz  and a low frequency filter of 1Hz . EEG data were recorded continuously and digitally stored. Description: The posterior dominant rhythm consists of 8Hz  activity of moderate voltage (25-35 uV) seen predominantly in posterior head regions,  symmetric and reactive to eye opening and eye closing. EEG showed intermittent generalized 3 to 6 Hz theta-delta slowing. Hyperventilation and photic stimulation were not performed.   ABNORMALITY - Continuous slow, generalized IMPRESSION: This study is suggestive of mild diffuse encephalopathy, nonspecific etiology. No seizures or epileptiform discharges were seen throughout the recording. Lora Havens   ECHOCARDIOGRAM COMPLETE  Result Date: 06/13/2021    ECHOCARDIOGRAM REPORT   Patient Name:   Melissa Lopez Date of Exam: 06/13/2021 IMPRESSIONS  1. 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 Left ventricular ejection fraction, by estimation, is 50 to 55%. The left ventricle has low normal function. The left ventricle has no regional wall motion abnormalities. There is severe eccentric left ventricular hypertrophy of the apical segment. Left ventricular diastolic parameters are indeterminate.  2. Right ventricular systolic function is normal. The right ventricular size is normal. There is normal pulmonary artery systolic pressure.  3. A small pericardial effusion is present. There is no evidence of cardiac tamponade.  4. The mitral valve is grossly  normal. Trivial mitral valve regurgitation.  5. The aortic valve has an indeterminant number of cusps. There is moderate calcification of the aortic valve. There is mild thickening of the aortic valve. Aortic valve regurgitation is not visualized. Mild aortic valve sclerosis is present, with no evidence of aortic valve stenosis.  6. Aortic dilatation noted. There is mild dilatation of the ascending aorta, measuring 39 mm.  7. The inferior vena cava is normal in size with greater than 50% respiratory variability, suggesting right atrial pressure of 3 mmHg. Comparison(s): Prior images reviewed side by side. Changes from prior study are noted. 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 EF low normal, potentially worse at apex but cannot be fully evaluated due to hypertrophy. Visually appears similar to prior. If device is MRI compatible, would consider cardiac MRI for further evaluation. No apical LV gradients measured on this study, but gradients cann be excluded as a potential etiology of syncope based on current images. Buford Dresser MD Electronically signed by Buford Dresser MD Signature Date/Time: 06/13/2021/6:55:31   DG HIP UNILAT WITH PELVIS 2-3 VIEWS LEFT  Result Date: 06/12/2021 CLINICAL DATA:  Fall this morning. Left lateral hip and left hip joint pain. EXAM: DG HIP (WITH OR WITHOUT PELVIS) 2-3V LEFT COMPARISON:  X-ray pelvis 05/24/2021. CT abdomen pelvis 11/09/2020 FINDINGS: There is no evidence of hip fracture or dislocation of the left hip. No definite acute displaced fracture or diastasis of the bones of the pelvis on frontal view. No definite acute displaced fracture or dislocation of the right hip on frontal view. Markedly limited evaluation of the sacrum due to overlying bowel gas. Degenerative changes of the visualized lower lumbar spine. There is no evidence of severe left hip arthropathy. No  aggressive appearing focal bone abnormality. IMPRESSION: Negative for acute traumatic injury. Electronically Signed   By: Iven Finn M.D.   On: 06/12/2021 18:30   CT Maxillofacial Wo Contrast  Result Date: 06/13/2021 CLINICAL DATA:  Head trauma.  Fall. EXAM: CT HEAD WITHOUT CONTRAST CT MAXILLOFACIAL WITHOUT CONTRAST CT CERVICAL SPINE WITHOUT CONTRAST IMPRESSION: 1. Chronic ischemic microangiopathy and generalized atrophy without acute intracranial abnormality. 2. No facial fracture. 3. No acute fracture or static subluxation of the cervical spine. Electronically Signed   By: Ulyses Jarred M.D.   On: 06/13/2021 03:45     Discharge Instructions: Discharge Instructions     Call MD  for:  persistant nausea and vomiting   Complete by: As directed        Signed: Wayland Denis, MD 06/18/21, 5:35 PM  Pager: [phone] Internal Medicine Resident, PGY-1 [facility]

## 2021-06-18 NOTE — Social Work (Signed)
Patient's daughter,Lori- will provide transportation once the patient is ready for discharge.    Thurmond Butts, MSW, LCSW Clinical Social Worker

## 2021-06-18 NOTE — Progress Notes (Signed)
Physical Therapy Treatment Patient Details Name: Melissa Lopez MRN: [id number] DOB: July 26, 1951 Today's Date: 06/18/2021    History of Present Illness The pt is a 70 yo female presenting 7/5 with low back pain and reports of multiple falls (9 over course of weekend) with possible seizure activity. PMH includes: CKD III, CAD, HTN, DM II, pacemaker placement, CHF, and primary hyperparathyroidism    PT Comments    The pt was eager to participate in PT session with focus on progression of OOB mobility, endurance, and safety awareness this session. The pt was able to demo good improvement in ambulation speed, stability, and endurance, completing x2 75 ft bouts of ambulation in the hall with RW and minG. The pt had no overt LOB, but does continue to require assist and BUE support to steady with mobility. She also requires increased time, cues, and intermittent RW assist to manage in tight spaces. The pt will continue to benefit from skilled PT to address functional strength, power, and stability, but will be safe to return home with family supervision/assist.     Follow Up Recommendations  Home health PT;Supervision for mobility/OOB     Equipment Recommendations  None recommended by PT (pt well equipped)    Recommendations for Other Services       Precautions / Restrictions Precautions Precautions: Fall Precaution Comments: significant history of falls Restrictions Weight Bearing Restrictions: No    Mobility  Bed Mobility Overal bed mobility: Needs Assistance Bed Mobility: Rolling;Sidelying to Sit Rolling: Supervision Sidelying to sit: Min guard       General bed mobility comments: supervision to minG with VC for hand placement, no physical assist but does require increased time    Transfers Overall transfer level: Needs assistance Equipment used: Rolling walker (2 wheeled) Transfers: Sit to/from Stand Sit to Stand: Min guard         General transfer comment: minG with  RW to power up, cues for hand placement  Ambulation/Gait Ambulation/Gait assistance: Min guard Gait Distance (Feet): 75 Feet (+ 75 ft (seated rest between)) Assistive device: Rolling walker (2 wheeled) Gait Pattern/deviations: Step-through pattern;Decreased stride length;Shuffle;Trunk flexed;Narrow base of support Gait velocity: decreased Gait velocity interpretation: <1.31 ft/sec, indicative of household ambulator General Gait Details: small steps, but improved clearance with cues, improved speed and fluidity when given target/goal ("walk to that chair") pt still needing minA at times to manage Rw to avoid being tangled in lines/leads      Balance Overall balance assessment: Needs assistance;History of Falls Sitting-balance support: Feet supported Sitting balance-Leahy Scale: Fair Sitting balance - Comments: R leaning in sitting due to pain in L hip   Standing balance support: Bilateral upper extremity supported;During functional activity Standing balance-Leahy Scale: Poor Standing balance comment: BUE support for gait, able to static stand without assist for short bouts with minG                            Cognition Arousal/Alertness: Awake/alert Behavior During Therapy: Restless;Flat affect Overall Cognitive Status: Impaired/Different from baseline Area of Impairment: Attention;Memory;Following commands;Safety/judgement;Problem solving                   Current Attention Level: Sustained Memory: Decreased short-term memory Following Commands: Follows one step commands with increased time;Follows one step commands consistently Safety/Judgement: Decreased awareness of deficits;Decreased awareness of safety Awareness: Intellectual Problem Solving: Slow processing;Decreased initiation;Requires verbal cues General Comments: Pt with slightly reduced restlessness today. able to follow commands regarding mobility  with few repetitions. pt stating she is ready to  return home with family assist, still benefits from cues for safety      Exercises      General Comments General comments (skin integrity, edema, etc.): VSS on RA      Pertinent Vitals/Pain Pain Assessment: Faces Faces Pain Scale: Hurts little more Pain Location: L hip with mobility Pain Descriptors / Indicators: Sore;Restless;Discomfort Pain Intervention(s): Limited activity within patient's tolerance;Monitored during session;Ice applied;Repositioned           PT Goals (current goals can now be found in the care plan section) Acute Rehab PT Goals Patient Stated Goal: to use the bathroom PT Goal Formulation: With patient Time For Goal Achievement: 06/28/21 Potential to Achieve Goals: Good Progress towards PT goals: Progressing toward goals    Frequency    Min 4X/week      PT Plan Current plan remains appropriate       AM-PAC PT "6 Clicks" Mobility   Outcome Measure  Help needed turning from your back to your side while in a flat bed without using bedrails?: A Little Help needed moving from lying on your back to sitting on the side of a flat bed without using bedrails?: A Little Help needed moving to and from a bed to a chair (including a wheelchair)?: A Little Help needed standing up from a chair using your arms (e.g., wheelchair or bedside chair)?: A Little Help needed to walk in hospital room?: A Little Help needed climbing 3-5 steps with a railing? : A Lot 6 Click Score: 17    End of Session Equipment Utilized During Treatment: Gait belt Activity Tolerance: Patient tolerated treatment well;Patient limited by pain Patient left: in bed;with call bell/phone within reach;with bed alarm set Nurse Communication: Mobility status PT Visit Diagnosis: Other abnormalities of gait and mobility (R26.89);Repeated falls (R29.6);Muscle weakness (generalized) (M62.81)     Time: 6283-6629 PT Time Calculation (min) (ACUTE ONLY): 28 min  Charges:  $Gait Training: 23-37  mins                    Karma Ganja, PT, DPT   Acute Rehabilitation Department Pager #: [phone]   Otho Bellows 06/18/2021, 2:58 PM

## 2021-06-18 NOTE — Progress Notes (Signed)
Orders confirmed with Dr. Vinetta Bergamo that pt is to receive 6 runs of Potassium.

## 2021-06-20 NOTE — Progress Notes (Signed)
Cardiology Office Note    Date:  06/25/2021   ID:  Melissa Lopez, DOB September 24, 1951, MRN [id number]   PCP:  [facility]  Cardiologist:  Carlyle Dolly, MD   Advanced Practice Provider:  No care team member to display Electrophysiologist:  Thompson Grayer, MD   [phone]   No chief complaint on file.   History of Present Illness:  Melissa Lopez is a 70 y.o. 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 She was also treated for hypercalcemia secondary to primary hyperparathyroidism. orthostatic vitals remain positive despite IV fluid resuscitation. Suspect she has autonomic dysfunction from her longstanding history of type 2 diabetes. Blood pressure management was challenging with her orthostasis. She would likely need a higher systolic blood pressure goal to reduce her risk of falls and syncope.   Patient comes in accompanied by her daughter. Since discharge PACE has stopped amlodipine and hydralazine. Drinking a lot of juice and kool-aid. No syncope since discharge. Had a lot of soy sauce last week. BP's have been ok at home per daughter. Highest has been 120 sys.  Past Medical History:  Diagnosis Date   CKD (chronic kidney disease) stage 3, GFR 30-59 ml/min (HCC) 10/31/2018   - per patient report   Coronary artery disease    per patient report - 2 caths with non-obstructive CAD   Essential hypertension     Frequent falls    H/O cardiac pacemaker 10/31/2018   implanted 05/2010   Sick sinus syndrome ([place])    Type 2 diabetes mellitus with complication, with long-term current use of insulin ([place]) 10/31/2018   with diabetic nephropathy    Past Surgical History:  Procedure Laterality Date   BIOPSY  09/28/2020   Procedure: BIOPSY;  Surgeon: Ronald Lobo, MD;  Location: [facility];  Service: Endoscopy;;   CARDIAC PACEMAKER PLACEMENT  2008   in [place] for sick sinus syndrome   CORONARY/GRAFT ACUTE MI REVASCULARIZATION N/A 11/09/2020   Procedure: CORONARY/GRAFT ACUTE MI REVASCULARIZATION;  Surgeon: Leonie Man, MD;  Location: [facility];  Service: Cardiovascular;  Laterality: N/A;   ESOPHAGOGASTRODUODENOSCOPY N/A 09/28/2020   Procedure: ESOPHAGOGASTRODUODENOSCOPY (EGD);  Surgeon: Ronald Lobo, MD;  Location: [facility];  Service: Endoscopy;  Laterality: N/A;   ESOPHAGOGASTRODUODENOSCOPY (EGD) WITH PROPOFOL N/A 11/10/2018   Procedure: ESOPHAGOGASTRODUODENOSCOPY (EGD) WITH PROPOFOL;  Surgeon: Wilford Corner, MD;  Location: [facility];  Service: Endoscopy;  Laterality: N/A;   LEFT HEART CATH AND CORONARY ANGIOGRAPHY N/A 10/31/2018   Procedure: LEFT HEART CATH AND CORONARY ANGIOGRAPHY;  Surgeon: Leonie Man, MD;  Location: [facility];  Service: Cardiovascular;  Laterality: N/A;   PACEMAKER GENERATOR CHANGE  09/2018   in [place]    Current Medications: Current Meds  Medication Sig   acetaminophen (TYLENOL) 325 MG tablet Take 650 mg by mouth 2 (two) times daily.   aspirin 81 MG chewable tablet Chew 1 tablet (81 mg total) by mouth daily.  carvedilol (COREG) 6.25 MG tablet Take 6.25 mg by mouth 2 (two) times daily with a meal.   Cholecalciferol 1.25 MG (50000 UT) capsule Take 50,000 Units by mouth every Sunday.   cinacalcet (SENSIPAR) 30 MG tablet Take 1 tablet (30 mg total) by mouth 2 (two) times daily with a meal.   cyclobenzaprine (FLEXERIL)  5 MG tablet Take 5 mg by mouth at bedtime.   docusate sodium (COLACE) 100 MG capsule Take 100 mg by mouth 2 (two) times daily.   escitalopram (LEXAPRO) 20 MG tablet Take 20 mg by mouth daily.   famotidine (PEPCID) 20 MG tablet Take 1 tablet (20 mg total) by mouth daily.   gabapentin (NEURONTIN) 100 MG capsule Take 100 mg by mouth 3 (three) times daily.   HYDROcodone-Acetaminophen 5-300 MG TABS Take 1 tablet by mouth 2 (two) times daily.   lisinopril (ZESTRIL) 10 MG tablet Take 1 tablet (10 mg total) by mouth daily.   Multiple Vitamin (MULTIVITAMIN WITH MINERALS) TABS tablet Take 1 tablet by mouth daily.   Nutritional Supplements (,FEEDING SUPPLEMENT, PROSOURCE PLUS) liquid Take 30 mLs by mouth daily.   ondansetron (ZOFRAN) 4 MG tablet Take 1 tablet (4 mg total) by mouth every 6 (six) hours.   oxybutynin (DITROPAN-XL) 5 MG 24 hr tablet Take 5 mg by mouth daily.   pantoprazole (PROTONIX) 20 MG tablet Take 1 tablet (20 mg total) by mouth daily.   rosuvastatin (CRESTOR) 5 MG tablet Take one tablet daily   [DISCONTINUED] lisinopril (ZESTRIL) 20 MG tablet Take 20 mg by mouth daily.     Allergies:   Contrast media [iodinated diagnostic agents], Oxycodone-acetaminophen, Xarelto  [rivaroxaban], and Oxycodone   Social History   Socioeconomic History   Marital status: Single    Spouse name: Not on file   Number of children: 4   Years of education: Not on file   Highest education level: Some college, no degree  Occupational History    Comment: retired  Tobacco Use   Smoking status: Former    Years: 3.00    Types: Cigarettes    Quit date: 12/09/1969    Years since quitting: 51.5   Smokeless tobacco: Never  Vaping Use   Vaping Use: Never used  Substance and Sexual Activity   Alcohol use: Not Currently   Drug use: Not Currently    Comment: as teenager   Sexual activity: Not on file  Other Topics Concern   Not on file  Social History Narrative   04/16/21 Moved from [place] and lives with  daughter, [name] in [place].   Now in [employer]   Social Determinants of Health   Financial Resource Strain: Not on file  Food Insecurity: Not on file  Transportation Needs: Not on file  Physical Activity: Not on file  Stress: Not on file  Social Connections: Not on file     Family History:  The patient's  family history includes Bone cancer in her mother; Breast cancer in her mother and sister; Diabetes in her brother; Heart attack (age of onset: 48) in her father; Heart disease in her brother; Other in her sister.   ROS:   Please see the history of present illness.    ROS All other systems reviewed and are negative.   PHYSICAL EXAM:   VS:  BP 102/60   Pulse 60   Ht 5\' 4"  (1.626 m)   Wt 138 lb (62.6 kg)   SpO2 99%   BMI 23.69 kg/m   Physical Exam  GEN:  Well nourished, well developed, in no acute distress  Neck: no JVD, carotid bruits, or masses Cardiac:RRR; no murmurs, rubs, or gallops  Respiratory:  clear to auscultation bilaterally, normal work of breathing GI: soft, nontender, nondistended, + BS Ext: without cyanosis, clubbing, or edema, Good distal pulses bilaterally Neuro:  Alert and Oriented x 3, Psych: euthymic mood, full affect  Wt Readings from Last 3 Encounters:  06/25/21 138 lb (62.6 kg)  06/18/21 140 lb (63.5 kg)  04/16/21 150 lb (68 kg)      Studies/Labs Reviewed:   EKG:  EKG is not ordered today.    Recent Labs: 06/13/2021: TSH 1.622 06/18/2021: ALT 23; BUN 37; Creatinine, Ser 1.86; Hemoglobin 13.6; Magnesium 2.0; Platelets 136; Potassium 3.4; Sodium 140   Lipid Panel    Component Value Date/Time   CHOL 153 11/01/2018 0516   TRIG 175 (H) 11/01/2018 0516   HDL 24 (L) 11/01/2018 0516   CHOLHDL 6.4 11/01/2018 0516   VLDL 35 11/01/2018 0516   LDLCALC 94 11/01/2018 0516    Additional studies/ records that were reviewed today include:  LHC 11/09/20: 1st Mrg lesion is 100% stenosed. Ost Ramus lesion is 65% stenosed. Ost LAD to Prox LAD lesion is 15%  stenosed with 60% stenosed side branch in Ost 1st Diag. Ost Cx to Prox Cx lesion is 45% stenosed. Diffuse mild CAD and very tortuous vessels. No true occlusive lesions. None   SUMMARY Stable nonocclusive mild to moderate disease with no culprit lesion for ST elevation MI. ->  The true diagnosis is no longer STEMI -> very similar findings with that very similar presentation 2 years ago. EF not assessed due to plan to conserve contrast. Relatively low LVEDP-IV given 500 mL bolus.     PLAN Due to bed availability, she will be admitted to CT ICU, but could be transferred if necessary We will consult [place] medicine service to assist with her severely elevated white blood cell count, already on antibiotics for possible UTI as well as profound nausea vomiting and hyperglycemia. ->  Concern for possible HONK (hyperosmotic nonketotic ketoacidosis  TTE 09/14/20: IMPRESSIONS  1. The endocardium in the apex is poorly visualized, cannot exclude  possible apical hypokinesis and/or apical hypertrophy, consider contrast  study. . Left ventricular ejection fraction, by estimation, is 55 to 60%.  The left ventricle has normal  function. Left ventricular endocardial border not optimally defined to  evaluate regional wall motion. There is moderate left ventricular  hypertrophy. Left ventricular diastolic parameters are consistent with  Grade I diastolic dysfunction (impaired  relaxation).   2. Right ventricular systolic function is normal. The right ventricular  size is normal.   3. The mitral valve is normal in structure. No evidence of mitral valve  regurgitation. No evidence of mitral stenosis.   4. The aortic valve is tricuspid. Aortic valve regurgitation is not  visualized. No aortic stenosis is present.   5. The inferior vena cava is normal in size with greater than 50%  respiratory variability, suggesting right atrial pressure of 3 mmHg.   Comparison(s): Echocardiogram done 11/01/18 showed and EF  of 55-60%.   LHC 95/18/8416: LV end diastolic pressure is mildly elevated. Ost Ramus lesion is 70% stenosed -relatively small caliber vessel. Not likely because of resting chest pain and ST elevations. Ost 1st Diag lesion is 60% stenosed. -Not likely flow-limiting. Very small caliber (<43mm) 1st Mrg lesion appears to be 100% stenosed with faint collateralization. Too small for angioplasty or stenting.   SUMMARY: Angiographically mild  diffuse disease, only potentially occluded vessels are very very small branch of the major obtuse marginal.  Not likely a culprit lesion for significant anginal chest pain. Mild to moderately elevated LVEDP.  LV gram not performed due to renal insufficiency.   RECOMMENDATIONS: Based on presentation, she will be admitted to the ICU but would have her stepdown status. Since were not clear of the etiology of her chest pain, will restart IV heparin 8 hours after sheath removal until we are able to exclude PE.  Also for the possible small branch occlusion. Check 2D echocardiogram to exclude pericardial effusion with possible pericarditis as etiology. Also need to evaluate her cause for syncope.  She did not have chest pain with syncope and denies agree with this chest pain so they probably are not related. Check 2D echocardiogram Interrogate pacemaker Monitor for arrhythmias on telemetry Recommend Aspirin 81mg  daily for moderate CAD. With renal insufficiency, will need to consider having internal medicine/nephrology follow especially with her diabetes. -Quite likely multiple noncardiac issues.   Risk Assessment/Calculations:         ASSESSMENT:    1. CAD in native artery   2. Essential hypertension   3. Cardiac pacemaker in situ   4. Stage 3 chronic kidney disease, unspecified whether stage 3a or 3b CKD (HCC)      PLAN:  In order of problems listed above:  CAD cath in 2019 mild diffuse disease, no angina  Convulsive syncope with orthostatic  hypotension. BP's still running low-will decrease lisinopril 10 mg daily. Monitored closely by PACE  Hypertension blood pressure- BP overall ok and hasn't had high spikes. Now off amlodipine and hydralazine.  Permanent pacemaker for symptomatic bradycardia followed by Dr. Lovena Le  CKD stage III creatinine   Shared Decision Making/Informed Consent        Medication Adjustments/Labs and Tests Ordered: Current medicines are reviewed at length with the patient today.  Concerns regarding medicines are outlined above.  Medication changes, Labs and Tests ordered today are listed in the Patient Instructions below. Patient Instructions  Medication Instructions:   Decrease Lisinopril to 10 mg daily  *If you need a refill on your cardiac medications before your next appointment, please call your pharmacy*   Lab Work: None today  If you have labs (blood work) drawn today and your tests are completely normal, you will receive your results only by: [place] (if you have MyChart) OR A paper copy in the mail If you have any lab test that is abnormal or we need to change your treatment, we will call you to review the results.   Testing/Procedures: None today    Follow-Up: At [facility], you and your health needs are our priority.  As part of our continuing mission to provide you with exceptional heart care, we have created designated Provider Care Teams.  These Care Teams include your primary Cardiologist (physician) and Advanced Practice Providers (APPs -  Physician Assistants and Nurse Practitioners) who all work together to provide you with the care you need, when you need it.  We recommend signing up for the patient portal called "MyChart".  Sign up information is provided on this After Visit Summary.  MyChart is used to connect with patients for Virtual Visits (Telemedicine).  Patients are able to view lab/test results, encounter notes, upcoming appointments, etc.  Non-urgent  messages can be sent to your provider as well.   To learn more about what you can do with MyChart, go to [email or web address].    Your  next appointment:   6 month(s)  The format for your next appointment:   In Person  Provider:   Ermalinda Barrios, PA-C   Other Instructions None     Signed, Ermalinda Barrios, PA-C  06/25/2021 2:01 PM    [address], [place], [place]  [address] Phone: [phone]; Fax: [phone]

## 2021-06-21 LAB — PTH-RELATED PEPTIDE: PTH-related peptide: <2 pmol/L

## 2021-06-22 LAB — CULTURE, BLOOD (ROUTINE X 2)
Culture: NO GROWTH
Culture: NO GROWTH

## 2021-06-22 LAB — ALDOSTERONE: Aldosterone: 7.3 ng/dL (ref 0.0–30.0)

## 2021-06-25 ENCOUNTER — Other Ambulatory Visit: Payer: Self-pay

## 2021-06-25 ENCOUNTER — Encounter: Payer: Self-pay | Admitting: Physician Assistant

## 2021-06-25 ENCOUNTER — Ambulatory Visit (INDEPENDENT_AMBULATORY_CARE_PROVIDER_SITE_OTHER): Payer: Medicare (Managed Care) | Admitting: Physician Assistant

## 2021-06-25 VITALS — BP 102/60 | HR 60 | Ht 64.0 in | Wt 138.0 lb

## 2021-06-25 DIAGNOSIS — N183 Chronic kidney disease, stage 3 unspecified: Secondary | ICD-10-CM

## 2021-06-25 DIAGNOSIS — I10 Essential (primary) hypertension: Secondary | ICD-10-CM | POA: Diagnosis not present

## 2021-06-25 DIAGNOSIS — Z950 Presence of cardiac pacemaker: Secondary | ICD-10-CM | POA: Diagnosis not present

## 2021-06-25 DIAGNOSIS — I2510 Atherosclerotic heart disease of native coronary artery without angina pectoris: Secondary | ICD-10-CM | POA: Diagnosis not present

## 2021-06-25 LAB — ALDOSTERONE + RENIN ACTIVITY W/ RATIO
ALDO / PRA Ratio: 3 (ref 0.0–30.0)
Aldosterone: 7.6 ng/dL (ref 0.0–30.0)
PRA LC/MS/MS: 2.544 ng/mL/hr (ref 0.167–5.380)

## 2021-06-25 MED ORDER — LISINOPRIL 10 MG PO TABS: 10.0000 mg | ORAL_TABLET | Freq: Every day | ORAL | 3 refills | Status: DC

## 2021-06-25 NOTE — Patient Instructions (Signed)
Medication Instructions:   Decrease Lisinopril to 10 mg daily  *If you need a refill on your cardiac medications before your next appointment, please call your pharmacy*   Lab Work: None today  If you have labs (blood work) drawn today and your tests are completely normal, you will receive your results only by: [place] (if you have MyChart) OR A paper copy in the mail If you have any lab test that is abnormal or we need to change your treatment, we will call you to review the results.   Testing/Procedures: None today    Follow-Up: At [facility], you and your health needs are our priority.  As part of our continuing mission to provide you with exceptional heart care, we have created designated Provider Care Teams.  These Care Teams include your primary Cardiologist (physician) and Advanced Practice Providers (APPs -  Physician Assistants and Nurse Practitioners) who all work together to provide you with the care you need, when you need it.  We recommend signing up for the patient portal called "MyChart".  Sign up information is provided on this After Visit Summary.  MyChart is used to connect with patients for Virtual Visits (Telemedicine).  Patients are able to view lab/test results, encounter notes, upcoming appointments, etc.  Non-urgent messages can be sent to your provider as well.   To learn more about what you can do with MyChart, go to [email or web address].    Your next appointment:   6 month(s)  The format for your next appointment:   In Person  Provider:   Ermalinda Barrios, PA-C   Other Instructions None

## 2021-07-02 NOTE — Progress Notes (Signed)
Electrophysiology Office Note Date: 07/03/2021  ID:  Melissa Lopez, DOB [birth year], MRN [id number]  PCP: [facility] [place] Primary Cardiologist: Carlyle Dolly, MD Electrophysiologist: Thompson Grayer, MD   CC: Pacemaker follow-up  Melissa Lopez is a 70 y.o. female seen today for Thompson Grayer, MD for post hospital follow up.    Pt admitted 7/5 - 7/11 with convulsive syncope. Pt had MRI 06/14/2021 (felt to be safe despite MRI non-conditional status of her PPM) with concerns for hyperreflexia, which was negative. Was felt to have at least some component of orthostasis. Also noted to have severe hypercalcemia. Encephalopathy felt to be 2/2 UTI, hyperCa, Hypovalemia, and hospital induced delirium.  Since discharge from hospital the patient reports doing well since leaving hospital. She has muscle aches in her legs and is following up with [facility] when leaving here today. She also saw primary cards 06/25/21. Denies further syncope since leaving hospital.   BP stable on decreased lisinopril.   Device History: [employer], minerals PPM implanted 2011, gen change 2019 for SSS  Past Medical History:  Diagnosis Date   CKD (chronic kidney disease) stage 3, GFR 30-59 ml/min ([place]) 10/31/2018   - per patient report   Coronary artery disease    per patient report - 2 caths with non-obstructive CAD   Essential hypertension    Frequent falls    H/O cardiac pacemaker 10/31/2018   implanted 05/2010   Sick sinus syndrome ([place])    Type 2 diabetes mellitus with complication, with long-term current use of insulin ([place]) 10/31/2018   with diabetic nephropathy   Past Surgical History:  Procedure Laterality Date   BIOPSY  09/28/2020   Procedure: BIOPSY;  Surgeon: Ronald Lobo, MD;  Location: [facility];  Service: Endoscopy;;   CARDIAC PACEMAKER PLACEMENT  2008   in [place] for sick sinus syndrome   CORONARY/GRAFT ACUTE MI REVASCULARIZATION N/A 11/09/2020    Procedure: CORONARY/GRAFT ACUTE MI REVASCULARIZATION;  Surgeon: Leonie Man, MD;  Location: [facility];  Service: Cardiovascular;  Laterality: N/A;   ESOPHAGOGASTRODUODENOSCOPY N/A 09/28/2020   Procedure: ESOPHAGOGASTRODUODENOSCOPY (EGD);  Surgeon: Ronald Lobo, MD;  Location: [facility];  Service: Endoscopy;  Laterality: N/A;   ESOPHAGOGASTRODUODENOSCOPY (EGD) WITH PROPOFOL N/A 11/10/2018   Procedure: ESOPHAGOGASTRODUODENOSCOPY (EGD) WITH PROPOFOL;  Surgeon: Wilford Corner, MD;  Location: [facility];  Service: Endoscopy;  Laterality: N/A;   LEFT HEART CATH AND CORONARY ANGIOGRAPHY N/A 10/31/2018   Procedure: LEFT HEART CATH AND CORONARY ANGIOGRAPHY;  Surgeon: Leonie Man, MD;  Location: [facility];  Service: Cardiovascular;  Laterality: N/A;   PACEMAKER GENERATOR CHANGE  09/2018   in [place]    Current Outpatient Medications  Medication Sig Dispense Refill   acetaminophen (TYLENOL) 325 MG tablet Take 650 mg by mouth 2 (two) times daily.     amLODipine (NORVASC) 10 MG tablet TAKE 1 TABLET BY MOUTH EVERY DAY 90 tablet 3   aspirin 81 MG chewable tablet Chew 1 tablet (81 mg total) by mouth daily. 30 tablet 0   carvedilol (COREG) 6.25 MG tablet Take 6.25 mg by mouth 2 (two) times daily with a meal.     Cholecalciferol 1.25 MG (50000 UT) capsule Take 50,000 Units by mouth every Sunday.     cinacalcet (SENSIPAR) 30 MG tablet Take 1 tablet (30 mg total) by mouth 2 (two) times daily with a meal. 60 tablet 1   cyclobenzaprine (FLEXERIL) 5 MG tablet Take 5 mg by  mouth at bedtime.     docusate sodium (COLACE) 100 MG capsule Take 100 mg by mouth 2 (two) times daily.     escitalopram (LEXAPRO) 20 MG tablet Take 20 mg by mouth daily.     famotidine (PEPCID) 20 MG tablet Take 1 tablet (20 mg total) by mouth daily. 30 tablet 0   gabapentin (NEURONTIN) 100 MG capsule Take 100 mg by mouth 3 (three) times daily.     HYDROcodone-Acetaminophen 5-300 MG TABS Take 1  tablet by mouth 2 (two) times daily.     insulin degludec (TRESIBA FLEXTOUCH) 100 UNIT/ML FlexTouch Pen Inject 5 Units into the skin daily.     lisinopril (ZESTRIL) 10 MG tablet Take 1 tablet (10 mg total) by mouth daily. 90 tablet 3   Multiple Vitamin (MULTIVITAMIN WITH MINERALS) TABS tablet Take 1 tablet by mouth daily.     Nutritional Supplements (,FEEDING SUPPLEMENT, PROSOURCE PLUS) liquid Take 30 mLs by mouth daily. 887 mL 0   ondansetron (ZOFRAN) 4 MG tablet Take 1 tablet (4 mg total) by mouth every 6 (six) hours. 12 tablet 0   oxybutynin (DITROPAN-XL) 5 MG 24 hr tablet Take 5 mg by mouth daily.     pantoprazole (PROTONIX) 20 MG tablet Take 1 tablet (20 mg total) by mouth daily. 30 tablet 0   rosuvastatin (CRESTOR) 5 MG tablet Take one tablet daily 90 tablet 3   traZODone (DESYREL) 50 MG tablet Take 50 mg by mouth at bedtime.     No current facility-administered medications for this visit.    Allergies:   Contrast media [iodinated diagnostic agents], Oxycodone-acetaminophen, Xarelto  [rivaroxaban], and Oxycodone   Social History: Social History   Socioeconomic History   Marital status: Single    Spouse name: Not on file   Number of children: 4   Years of education: Not on file   Highest education level: Some college, no degree  Occupational History    Comment: retired  Tobacco Use   Smoking status: Former    Years: 3.00    Types: Cigarettes    Quit date: 12/09/1969    Years since quitting: 51.6   Smokeless tobacco: Never  Vaping Use   Vaping Use: Never used  Substance and Sexual Activity   Alcohol use: Not Currently   Drug use: Not Currently    Comment: as teenager   Sexual activity: Not on file  Other Topics Concern   Not on file  Social History Narrative   04/16/21 Moved from [place] and lives with daughter, [name] in [place].   Now in [employer]   Social Determinants of Health   Financial Resource Strain: Not on file  Food Insecurity: Not on file  Transportation Needs: Not  on file  Physical Activity: Not on file  Stress: Not on file  Social Connections: Not on file  Intimate Partner Violence: Not on file    Family History: Family History  Problem Relation Age of Onset   Breast cancer Mother    Bone cancer Mother    Heart attack Father 51   Breast cancer Sister    Heart disease Brother        stents   Other Sister        MVA   Diabetes Brother      Review of Systems: All other systems reviewed and are otherwise negative except as noted above.  Physical Exam: Vitals:   07/03/21 1006  BP: 102/72  Pulse: 61  SpO2: 98%  Weight: 144 lb (65.3  kg)  Height: 5\' 4"  (1.626 m)     GEN- The patient is well appearing, alert and oriented x 3 today.   HEENT: normocephalic, atraumatic; sclera clear, conjunctiva pink; hearing intact; oropharynx clear; neck supple  Lungs- Clear to ausculation bilaterally, normal work of breathing.  No wheezes, rales, rhonchi Heart- Regular rate and rhythm, no murmurs, rubs or gallops  GI- soft, non-tender, non-distended, bowel sounds present  Extremities- no clubbing or cyanosis. No edema MS- no significant deformity or atrophy Skin- warm and dry, no rash or lesion; PPM pocket well healed Psych- euthymic mood, full affect Neuro- strength and sensation are intact  PPM Interrogation- reviewed in detail today,  See [facility] report  EKG:  EKG is not ordered today. The ekg ordered 06/13/21 shows NSR at 60 bpm  Recent Labs: 06/13/2021: TSH 1.622 06/18/2021: ALT 23; BUN 37; Creatinine, Ser 1.86; Hemoglobin 13.6; Magnesium 2.0; Platelets 136; Potassium 3.4; Sodium 140   Wt Readings from Last 3 Encounters:  07/03/21 144 lb (65.3 kg)  06/25/21 138 lb (62.6 kg)  06/18/21 140 lb (63.5 kg)     Other studies Reviewed: Additional studies/ records that were reviewed today include: Previous EP office notes, Previous remote checks, Most recent labwork.   Assessment and Plan:  1. Sick sinus syndrome s/p St. [employer] PPM  Normal PPM  function See [facility] report No changes today  2. HTN Stable on current regimen  3. Syncope Felt to be multifactorial with orthostasis, hyperCa, and diabetic neuropathy  4. Hypokalemia Labs per [place]. She states she has an appointment today and don't want to stick twice. I personally called the [facility] and spoke to [place], [place] there. Confirmed that she will have BMET drawn there today along with any other labs that they would have drawn.    Current medicines are reviewed at length with the patient today.   The patient does not have concerns regarding her medicines.  The following changes were made today:  none  Labs/ tests ordered today include:  No orders of the defined types were placed in this encounter.  Disposition:   Follow up with Dr. Rayann Heman or EP APP in 1  year. Sooner with issues.      Jacalyn Lefevre, PA-C  07/03/2021 10:21 AM  [facility] [address] [place] [address] [phone] (office) [phone] (fax)

## 2021-07-03 ENCOUNTER — Encounter: Payer: Self-pay | Admitting: Student

## 2021-07-03 ENCOUNTER — Ambulatory Visit (INDEPENDENT_AMBULATORY_CARE_PROVIDER_SITE_OTHER): Payer: Medicare (Managed Care) | Admitting: Student

## 2021-07-03 ENCOUNTER — Other Ambulatory Visit: Payer: Self-pay

## 2021-07-03 VITALS — BP 102/72 | HR 61 | Ht 64.0 in | Wt 144.0 lb

## 2021-07-03 DIAGNOSIS — I495 Sick sinus syndrome: Secondary | ICD-10-CM

## 2021-07-03 DIAGNOSIS — Z950 Presence of cardiac pacemaker: Secondary | ICD-10-CM

## 2021-07-03 DIAGNOSIS — I10 Essential (primary) hypertension: Secondary | ICD-10-CM | POA: Diagnosis not present

## 2021-07-03 LAB — CUP PACEART INCLINIC DEVICE CHECK
Battery Remaining Longevity: 88 mo
Battery Voltage: 2.98 V
Brady Statistic RA Percent Paced: 77 %
Brady Statistic RV Percent Paced: 36 %
Date Time Interrogation Session: 20220726102018
Implantable Lead Implant Date: 20110113
Implantable Lead Implant Date: 20110113
Implantable Lead Location: 753859
Implantable Lead Location: 753860
Implantable Pulse Generator Implant Date: 20191008
Lead Channel Impedance Value: 462.5 Ohm
Lead Channel Impedance Value: 662.5 Ohm
Lead Channel Pacing Threshold Amplitude: 0.75 V
Lead Channel Pacing Threshold Amplitude: 1 V
Lead Channel Pacing Threshold Pulse Width: 0.4 ms
Lead Channel Pacing Threshold Pulse Width: 0.4 ms
Lead Channel Sensing Intrinsic Amplitude: 12 mV
Lead Channel Sensing Intrinsic Amplitude: 4.6 mV
Lead Channel Setting Pacing Amplitude: 1.25 V
Lead Channel Setting Pacing Amplitude: 1.75 V
Lead Channel Setting Pacing Pulse Width: 0.4 ms
Lead Channel Setting Sensing Sensitivity: 2 mV
Pulse Gen Model: 2272
Pulse Gen Serial Number: 9064940

## 2021-07-03 NOTE — Patient Instructions (Signed)
Medication Instructions:  Your physician recommends that you continue on your current medications as directed. Please refer to the Current Medication list given to you today.  *If you need a refill on your cardiac medications before your next appointment, please call your pharmacy*   Lab Work: None If you have labs (blood work) drawn today and your tests are completely normal, you will receive your results only by: MyChart Message (if you have MyChart) OR A paper copy in the mail If you have any lab test that is abnormal or we need to change your treatment, we will call you to review the results.   Follow-Up: At [facility], you and your health needs are our priority.  As part of our continuing mission to provide you with exceptional heart care, we have created designated Provider Care Teams.  These Care Teams include your primary Cardiologist (physician) and Advanced Practice Providers (APPs -  Physician Assistants and Nurse Practitioners) who all work together to provide you with the care you need, when you need it.   Your next appointment:   1 year(s)  The format for your next appointment:   In Person  Provider:   You may see James Allred, MD or one of the following Advanced Practice Providers on your designated Care Team:   Renee Ursuy, PA-C Michael "Andy" Tillery, PA-C  

## 2021-07-12 ENCOUNTER — Ambulatory Visit (INDEPENDENT_AMBULATORY_CARE_PROVIDER_SITE_OTHER): Payer: Medicare (Managed Care)

## 2021-07-12 DIAGNOSIS — I495 Sick sinus syndrome: Secondary | ICD-10-CM | POA: Diagnosis not present

## 2021-07-12 LAB — CUP PACEART REMOTE DEVICE CHECK
Battery Remaining Longevity: 90 mo
Battery Remaining Percentage: 73 %
Battery Voltage: 3.01 V
Brady Statistic AP VP Percent: 71 %
Brady Statistic AP VS Percent: 28 %
Brady Statistic AS VP Percent: 1 %
Brady Statistic AS VS Percent: 1 %
Brady Statistic RA Percent Paced: 99 %
Brady Statistic RV Percent Paced: 71 %
Date Time Interrogation Session: 20220804020018
Implantable Lead Implant Date: 20110113
Implantable Lead Implant Date: 20110113
Implantable Lead Location: 753859
Implantable Lead Location: 753860
Implantable Pulse Generator Implant Date: 20191008
Lead Channel Impedance Value: 460 Ohm
Lead Channel Impedance Value: 640 Ohm
Lead Channel Pacing Threshold Amplitude: 0.625 V
Lead Channel Pacing Threshold Amplitude: 1.375 V
Lead Channel Pacing Threshold Pulse Width: 0.4 ms
Lead Channel Pacing Threshold Pulse Width: 0.4 ms
Lead Channel Sensing Intrinsic Amplitude: 12 mV
Lead Channel Sensing Intrinsic Amplitude: 4.3 mV
Lead Channel Setting Pacing Amplitude: 1.625
Lead Channel Setting Pacing Amplitude: 1.625
Lead Channel Setting Pacing Pulse Width: 0.4 ms
Lead Channel Setting Sensing Sensitivity: 2 mV
Pulse Gen Model: 2272
Pulse Gen Serial Number: 9064940

## 2021-07-31 ENCOUNTER — Other Ambulatory Visit: Payer: Self-pay | Admitting: Family Medicine

## 2021-07-31 ENCOUNTER — Ambulatory Visit
Admission: RE | Admit: 2021-07-31 | Discharge: 2021-07-31 | Disposition: A | Discharge status: Home / Self Care | Payer: Medicare (Managed Care) | Source: Ambulatory Visit | Attending: Family Medicine | Admitting: Family Medicine

## 2021-07-31 DIAGNOSIS — K5900 Constipation, unspecified: Secondary | ICD-10-CM

## 2021-08-01 ENCOUNTER — Emergency Department (HOSPITAL_COMMUNITY): Payer: Medicare (Managed Care)

## 2021-08-01 ENCOUNTER — Ambulatory Visit: Payer: Medicaid Other | Admitting: Diagnostic Neuroimaging

## 2021-08-01 ENCOUNTER — Other Ambulatory Visit: Payer: Self-pay

## 2021-08-01 ENCOUNTER — Inpatient Hospital Stay (HOSPITAL_COMMUNITY)
Admission: EM | Admit: 2021-08-01 | Discharge: 2021-08-07 | DRG: 368 | Disposition: A | Discharge status: Home / Self Care | Payer: Medicare (Managed Care) | Attending: Internal Medicine | Admitting: Internal Medicine

## 2021-08-01 ENCOUNTER — Encounter (HOSPITAL_COMMUNITY): Payer: Self-pay | Admitting: Emergency Medicine

## 2021-08-01 ENCOUNTER — Telehealth: Payer: Self-pay

## 2021-08-01 DIAGNOSIS — N183 Chronic kidney disease, stage 3 unspecified: Secondary | ICD-10-CM | POA: Diagnosis present

## 2021-08-01 DIAGNOSIS — M25552 Pain in left hip: Secondary | ICD-10-CM | POA: Diagnosis present

## 2021-08-01 DIAGNOSIS — E0821 Diabetes mellitus due to underlying condition with diabetic nephropathy: Secondary | ICD-10-CM | POA: Diagnosis not present

## 2021-08-01 DIAGNOSIS — E1121 Type 2 diabetes mellitus with diabetic nephropathy: Secondary | ICD-10-CM | POA: Diagnosis present

## 2021-08-01 DIAGNOSIS — R109 Unspecified abdominal pain: Secondary | ICD-10-CM

## 2021-08-01 DIAGNOSIS — K3184 Gastroparesis: Secondary | ICD-10-CM | POA: Diagnosis present

## 2021-08-01 DIAGNOSIS — Z885 Allergy status to narcotic agent status: Secondary | ICD-10-CM

## 2021-08-01 DIAGNOSIS — I422 Other hypertrophic cardiomyopathy: Secondary | ICD-10-CM | POA: Diagnosis present

## 2021-08-01 DIAGNOSIS — R1310 Dysphagia, unspecified: Secondary | ICD-10-CM | POA: Diagnosis present

## 2021-08-01 DIAGNOSIS — I130 Hypertensive heart and chronic kidney disease with heart failure and stage 1 through stage 4 chronic kidney disease, or unspecified chronic kidney disease: Secondary | ICD-10-CM | POA: Diagnosis present

## 2021-08-01 DIAGNOSIS — K920 Hematemesis: Secondary | ICD-10-CM

## 2021-08-01 DIAGNOSIS — G901 Familial dysautonomia [Riley-Day]: Secondary | ICD-10-CM | POA: Diagnosis present

## 2021-08-01 DIAGNOSIS — I5032 Chronic diastolic (congestive) heart failure: Secondary | ICD-10-CM | POA: Diagnosis present

## 2021-08-01 DIAGNOSIS — E860 Dehydration: Secondary | ICD-10-CM | POA: Diagnosis present

## 2021-08-01 DIAGNOSIS — R200 Anesthesia of skin: Secondary | ICD-10-CM | POA: Diagnosis present

## 2021-08-01 DIAGNOSIS — N390 Urinary tract infection, site not specified: Secondary | ICD-10-CM | POA: Diagnosis present

## 2021-08-01 DIAGNOSIS — Z91041 Radiographic dye allergy status: Secondary | ICD-10-CM

## 2021-08-01 DIAGNOSIS — Z950 Presence of cardiac pacemaker: Secondary | ICD-10-CM | POA: Diagnosis not present

## 2021-08-01 DIAGNOSIS — K2100 Gastro-esophageal reflux disease with esophagitis, without bleeding: Secondary | ICD-10-CM | POA: Diagnosis present

## 2021-08-01 DIAGNOSIS — R9431 Abnormal electrocardiogram [ECG] [EKG]: Secondary | ICD-10-CM | POA: Diagnosis not present

## 2021-08-01 DIAGNOSIS — I69398 Other sequelae of cerebral infarction: Secondary | ICD-10-CM | POA: Diagnosis not present

## 2021-08-01 DIAGNOSIS — E1143 Type 2 diabetes mellitus with diabetic autonomic (poly)neuropathy: Secondary | ICD-10-CM | POA: Diagnosis present

## 2021-08-01 DIAGNOSIS — K644 Residual hemorrhoidal skin tags: Secondary | ICD-10-CM | POA: Diagnosis present

## 2021-08-01 DIAGNOSIS — E210 Primary hyperparathyroidism: Secondary | ICD-10-CM | POA: Diagnosis present

## 2021-08-01 DIAGNOSIS — E861 Hypovolemia: Secondary | ICD-10-CM | POA: Diagnosis present

## 2021-08-01 DIAGNOSIS — Z20822 Contact with and (suspected) exposure to covid-19: Secondary | ICD-10-CM | POA: Diagnosis present

## 2021-08-01 DIAGNOSIS — Z7982 Long term (current) use of aspirin: Secondary | ICD-10-CM

## 2021-08-01 DIAGNOSIS — G8929 Other chronic pain: Secondary | ICD-10-CM | POA: Diagnosis present

## 2021-08-01 DIAGNOSIS — D72829 Elevated white blood cell count, unspecified: Secondary | ICD-10-CM | POA: Diagnosis present

## 2021-08-01 DIAGNOSIS — K5900 Constipation, unspecified: Secondary | ICD-10-CM

## 2021-08-01 DIAGNOSIS — G9341 Metabolic encephalopathy: Secondary | ICD-10-CM | POA: Diagnosis present

## 2021-08-01 DIAGNOSIS — Z833 Family history of diabetes mellitus: Secondary | ICD-10-CM

## 2021-08-01 DIAGNOSIS — E1122 Type 2 diabetes mellitus with diabetic chronic kidney disease: Secondary | ICD-10-CM | POA: Diagnosis present

## 2021-08-01 DIAGNOSIS — K449 Diaphragmatic hernia without obstruction or gangrene: Secondary | ICD-10-CM | POA: Diagnosis present

## 2021-08-01 DIAGNOSIS — K5909 Other constipation: Secondary | ICD-10-CM | POA: Diagnosis not present

## 2021-08-01 DIAGNOSIS — Z8249 Family history of ischemic heart disease and other diseases of the circulatory system: Secondary | ICD-10-CM

## 2021-08-01 DIAGNOSIS — Z79899 Other long term (current) drug therapy: Secondary | ICD-10-CM

## 2021-08-01 DIAGNOSIS — N1832 Chronic kidney disease, stage 3b: Secondary | ICD-10-CM | POA: Diagnosis present

## 2021-08-01 DIAGNOSIS — I10 Essential (primary) hypertension: Secondary | ICD-10-CM | POA: Diagnosis not present

## 2021-08-01 DIAGNOSIS — Z888 Allergy status to other drugs, medicaments and biological substances status: Secondary | ICD-10-CM

## 2021-08-01 DIAGNOSIS — E876 Hypokalemia: Secondary | ICD-10-CM | POA: Diagnosis present

## 2021-08-01 DIAGNOSIS — Z87891 Personal history of nicotine dependence: Secondary | ICD-10-CM

## 2021-08-01 DIAGNOSIS — N179 Acute kidney failure, unspecified: Secondary | ICD-10-CM

## 2021-08-01 DIAGNOSIS — K226 Gastro-esophageal laceration-hemorrhage syndrome: Principal | ICD-10-CM | POA: Diagnosis present

## 2021-08-01 DIAGNOSIS — R1013 Epigastric pain: Secondary | ICD-10-CM | POA: Diagnosis not present

## 2021-08-01 DIAGNOSIS — I959 Hypotension, unspecified: Secondary | ICD-10-CM | POA: Diagnosis present

## 2021-08-01 DIAGNOSIS — I495 Sick sinus syndrome: Secondary | ICD-10-CM | POA: Diagnosis present

## 2021-08-01 DIAGNOSIS — Z803 Family history of malignant neoplasm of breast: Secondary | ICD-10-CM

## 2021-08-01 DIAGNOSIS — I2510 Atherosclerotic heart disease of native coronary artery without angina pectoris: Secondary | ICD-10-CM | POA: Diagnosis present

## 2021-08-01 DIAGNOSIS — Z794 Long term (current) use of insulin: Secondary | ICD-10-CM

## 2021-08-01 DIAGNOSIS — K2900 Acute gastritis without bleeding: Secondary | ICD-10-CM | POA: Diagnosis present

## 2021-08-01 DIAGNOSIS — R531 Weakness: Secondary | ICD-10-CM

## 2021-08-01 DIAGNOSIS — Z96653 Presence of artificial knee joint, bilateral: Secondary | ICD-10-CM | POA: Diagnosis present

## 2021-08-01 LAB — ECHOCARDIOGRAM LIMITED
Area-P 1/2: 3.15 cm2
Height: 64 in
S' Lateral: 2.71 cm
Weight: 2303.37 oz

## 2021-08-01 LAB — URINALYSIS, ROUTINE W REFLEX MICROSCOPIC
Bacteria, UA: NONE SEEN
Bilirubin Urine: NEGATIVE
Glucose, UA: 50 mg/dL — AB
Hgb urine dipstick: NEGATIVE
Ketones, ur: NEGATIVE mg/dL
Nitrite: NEGATIVE
Protein, ur: 30 mg/dL — AB
Specific Gravity, Urine: 1.009 (ref 1.005–1.030)
pH: 6 (ref 5.0–8.0)

## 2021-08-01 LAB — I-STAT CHEM 8, ED
BUN: 55 mg/dL — ABNORMAL HIGH (ref 8–23)
Calcium, Ion: 1.49 mmol/L — ABNORMAL HIGH (ref 1.15–1.40)
Chloride: 103 mmol/L (ref 98–111)
Creatinine, Ser: 2.5 mg/dL — ABNORMAL HIGH (ref 0.44–1.00)
Glucose, Bld: 148 mg/dL — ABNORMAL HIGH (ref 70–99)
HCT: 47 % — ABNORMAL HIGH (ref 36.0–46.0)
Hemoglobin: 16 g/dL — ABNORMAL HIGH (ref 12.0–15.0)
Potassium: 2.8 mmol/L — ABNORMAL LOW (ref 3.5–5.1)
Sodium: 139 mmol/L (ref 135–145)
TCO2: 29 mmol/L (ref 22–32)

## 2021-08-01 LAB — CBC
HCT: 40 % (ref 36.0–46.0)
Hemoglobin: 13.5 g/dL (ref 12.0–15.0)
MCH: 31.6 pg (ref 26.0–34.0)
MCHC: 33.8 g/dL (ref 30.0–36.0)
MCV: 93.7 fL (ref 80.0–100.0)
Platelets: 169 10*3/uL (ref 150–400)
RBC: 4.27 MIL/uL (ref 3.87–5.11)
RDW: 13.5 % (ref 11.5–15.5)
WBC: 23.7 10*3/uL — ABNORMAL HIGH (ref 4.0–10.5)
nRBC: 0 % (ref 0.0–0.2)

## 2021-08-01 LAB — CBC WITH DIFFERENTIAL/PLATELET
Abs Immature Granulocytes: 0.29 10*3/uL — ABNORMAL HIGH (ref 0.00–0.07)
Basophils Absolute: 0.1 10*3/uL (ref 0.0–0.1)
Basophils Relative: 0 %
Eosinophils Absolute: 0 10*3/uL (ref 0.0–0.5)
Eosinophils Relative: 0 %
HCT: 46.5 % — ABNORMAL HIGH (ref 36.0–46.0)
Hemoglobin: 16 g/dL — ABNORMAL HIGH (ref 12.0–15.0)
Immature Granulocytes: 1 %
Lymphocytes Relative: 8 %
Lymphs Abs: 2.3 10*3/uL (ref 0.7–4.0)
MCH: 32 pg (ref 26.0–34.0)
MCHC: 34.4 g/dL (ref 30.0–36.0)
MCV: 93 fL (ref 80.0–100.0)
Monocytes Absolute: 1.9 10*3/uL — ABNORMAL HIGH (ref 0.1–1.0)
Monocytes Relative: 7 %
Neutro Abs: 23.3 10*3/uL — ABNORMAL HIGH (ref 1.7–7.7)
Neutrophils Relative %: 84 %
Platelets: 227 10*3/uL (ref 150–400)
RBC: 5 MIL/uL (ref 3.87–5.11)
RDW: 13.5 % (ref 11.5–15.5)
WBC: 27.9 10*3/uL — ABNORMAL HIGH (ref 4.0–10.5)
nRBC: 0 % (ref 0.0–0.2)

## 2021-08-01 LAB — TYPE AND SCREEN
ABO/RH(D): A POS
Antibody Screen: NEGATIVE

## 2021-08-01 LAB — COMPREHENSIVE METABOLIC PANEL
ALT: 24 U/L (ref 0–44)
AST: 25 U/L (ref 15–41)
Albumin: 4.6 g/dL (ref 3.5–5.0)
Alkaline Phosphatase: 85 U/L (ref 38–126)
Anion gap: 11 (ref 5–15)
BUN: 61 mg/dL — ABNORMAL HIGH (ref 8–23)
CO2: 26 mmol/L (ref 22–32)
Calcium: 12 mg/dL — ABNORMAL HIGH (ref 8.9–10.3)
Chloride: 99 mmol/L (ref 98–111)
Creatinine, Ser: 2.46 mg/dL — ABNORMAL HIGH (ref 0.44–1.00)
GFR, Estimated: 21 mL/min — ABNORMAL LOW (ref >60)
Glucose, Bld: 147 mg/dL — ABNORMAL HIGH (ref 70–99)
Potassium: 2.7 mmol/L — CL (ref 3.5–5.1)
Sodium: 136 mmol/L (ref 135–145)
Total Bilirubin: 1.2 mg/dL (ref 0.3–1.2)
Total Protein: 8.6 g/dL — ABNORMAL HIGH (ref 6.5–8.1)

## 2021-08-01 LAB — MAGNESIUM: Magnesium: 2.4 mg/dL (ref 1.7–2.4)

## 2021-08-01 LAB — TROPONIN I (HIGH SENSITIVITY)
Troponin I (High Sensitivity): 140 ng/L (ref <18)
Troponin I (High Sensitivity): 109 ng/L (ref <18)

## 2021-08-01 LAB — RESP PANEL BY RT-PCR (FLU A&B, COVID) ARPGX2
Influenza A by PCR: NEGATIVE
Influenza B by PCR: NEGATIVE
SARS Coronavirus 2 by RT PCR: NEGATIVE

## 2021-08-01 LAB — GLUCOSE, CAPILLARY: Glucose-Capillary: 125 mg/dL — ABNORMAL HIGH (ref 70–99)

## 2021-08-01 LAB — POC OCCULT BLOOD, ED: Fecal Occult Bld: POSITIVE — AB

## 2021-08-01 MED ORDER — ONDANSETRON HCL 4 MG/2ML IJ SOLN
4.0000 mg | Freq: Once | INTRAMUSCULAR | Status: AC
  Administered 2021-08-01: 4 mg via INTRAVENOUS
  Filled 2021-08-01: qty 2

## 2021-08-01 MED ORDER — ACETAMINOPHEN 325 MG PO TABS
650.0000 mg | ORAL_TABLET | Freq: Four times a day (QID) | ORAL | Status: DC | PRN
  Administered 2021-08-02 – 2021-08-05 (×5): 650 mg via ORAL
  Filled 2021-08-01 (×5): qty 2

## 2021-08-01 MED ORDER — CHLORHEXIDINE GLUCONATE CLOTH 2 % EX PADS
6.0000 | MEDICATED_PAD | Freq: Every day | CUTANEOUS | Status: DC
  Administered 2021-08-02 – 2021-08-05 (×4): 6 via TOPICAL

## 2021-08-01 MED ORDER — INSULIN ASPART 100 UNIT/ML IJ SOLN: 0.0000 [IU] | Freq: Four times a day (QID) | INTRAMUSCULAR | Status: DC

## 2021-08-01 MED ORDER — MORPHINE SULFATE (PF) 2 MG/ML IV SOLN
2.0000 mg | INTRAVENOUS | Status: DC | PRN
  Administered 2021-08-01 – 2021-08-02 (×3): 2 mg via INTRAVENOUS
  Filled 2021-08-01 (×3): qty 1

## 2021-08-01 MED ORDER — PANTOPRAZOLE SODIUM 40 MG IV SOLR
40.0000 mg | Freq: Once | INTRAVENOUS | Status: AC
  Administered 2021-08-01: 40 mg via INTRAVENOUS
  Filled 2021-08-01: qty 40

## 2021-08-01 MED ORDER — POTASSIUM CHLORIDE 10 MEQ/100ML IV SOLN
10.0000 meq | Freq: Once | INTRAVENOUS | Status: AC
  Administered 2021-08-01: 10 meq via INTRAVENOUS
  Filled 2021-08-01: qty 100

## 2021-08-01 MED ORDER — ONDANSETRON HCL 4 MG PO TABS: 4.0000 mg | ORAL_TABLET | Freq: Four times a day (QID) | ORAL | Status: DC | PRN

## 2021-08-01 MED ORDER — POTASSIUM CHLORIDE IN NACL 20-0.9 MEQ/L-% IV SOLN: INTRAVENOUS | Status: AC

## 2021-08-01 MED ORDER — MORPHINE SULFATE (PF) 2 MG/ML IV SOLN
2.0000 mg | Freq: Once | INTRAVENOUS | Status: AC
  Administered 2021-08-01: 2 mg via INTRAVENOUS
  Filled 2021-08-01: qty 1

## 2021-08-01 MED ORDER — INSULIN ASPART 100 UNIT/ML IJ SOLN
0.0000 [IU] | INTRAMUSCULAR | Status: DC
Start: 2021-08-02 — End: 2021-08-01

## 2021-08-01 MED ORDER — HYDRALAZINE HCL 20 MG/ML IJ SOLN
10.0000 mg | INTRAMUSCULAR | Status: DC | PRN
  Administered 2021-08-01 – 2021-08-05 (×5): 10 mg via INTRAVENOUS
  Filled 2021-08-01 (×5): qty 1

## 2021-08-01 MED ORDER — SODIUM CHLORIDE 0.9 % IV BOLUS
1000.0000 mL | Freq: Once | INTRAVENOUS | Status: AC
  Administered 2021-08-01: 1000 mL via INTRAVENOUS

## 2021-08-01 MED ORDER — MORPHINE SULFATE (PF) 2 MG/ML IV SOLN: 2.0000 mg | INTRAVENOUS | Status: DC | PRN

## 2021-08-01 MED ORDER — BISACODYL 10 MG RE SUPP
10.0000 mg | Freq: Two times a day (BID) | RECTAL | Status: AC
  Administered 2021-08-02: 10 mg via RECTAL
  Filled 2021-08-01: qty 1

## 2021-08-01 MED ORDER — ONDANSETRON HCL 4 MG/2ML IJ SOLN
4.0000 mg | Freq: Four times a day (QID) | INTRAMUSCULAR | Status: DC | PRN
  Administered 2021-08-01 – 2021-08-04 (×7): 4 mg via INTRAVENOUS
  Filled 2021-08-01 (×7): qty 2

## 2021-08-01 MED ORDER — PANTOPRAZOLE SODIUM 40 MG IV SOLR
40.0000 mg | Freq: Two times a day (BID) | INTRAVENOUS | Status: DC
  Administered 2021-08-02 – 2021-08-07 (×12): 40 mg via INTRAVENOUS
  Filled 2021-08-01 (×12): qty 40

## 2021-08-01 MED ORDER — PIPERACILLIN-TAZOBACTAM 3.375 G IVPB 30 MIN
3.3750 g | Freq: Once | INTRAVENOUS | Status: AC
  Administered 2021-08-01: 3.375 g via INTRAVENOUS
  Filled 2021-08-01: qty 50

## 2021-08-01 MED ORDER — ACETAMINOPHEN 650 MG RE SUPP: 650.0000 mg | Freq: Four times a day (QID) | RECTAL | Status: DC | PRN

## 2021-08-01 NOTE — Telephone Encounter (Signed)
"  Merlin alert for HVR 14 AMS and 9 HVR events, longest duratin 5 1/2 min EGM's show AF with RVR, two EGM's appear to be SVT. Pt. prescribed Norvasc/Coreg.  No OAC or hx of AF per EPIC."  Spoke to patients daughter [name]), states she has been vomiting for 3 days and yesterday started vomiting bright red blood including clots that appear mucus. States patient is patient of [facility] and saw the provider yesterday for vomiting/hemoptysis, prescribed zofran. Recommenced patient go to the ED for evaluation. States she will have to contact the [facility] provider to get approval. Advised if he needs any questions answered to please have him call. Verbalized understanding.

## 2021-08-01 NOTE — ED Notes (Signed)
Amanda from [facility] was given an update on the pt status.

## 2021-08-01 NOTE — ED Triage Notes (Signed)
Pt arrives via [facility] with c/o n/v x3 days and abdominal pain. Seen by PCP yesterday for same. Today, daughter noted blood in emesis. Unknown amount. Pt states emesis is brown/red in color. Pacemaker in place.

## 2021-08-01 NOTE — Progress Notes (Addendum)
EKG with ST elevations noted previously in 10/2018 and 11/2020, both times cath showed no significant disease. Presents with abdominal symptoms, N/V. Agree with not activating STEMI at this time, follow up troponin. Leukocytosis and abdominal symptoms workup per ER team. In absence of significant trop would not consider additional cardiac testing. From 06/2021 echo evidence of apical hypertrophy which could explain the EKG changes.    Addendum  Relatively mild initial troponin, suspect demand ischemia in setting of N/V, AKI, leukocytosis. We will f/u trop trend and limited echo. Will likely need medicine admission for management of her abdominal symptoms, leukocytosis, AKI, hypokalemia. Full cardiology consult once additional cardiac results are back. Would not anticoagulate given this is likely demand ischemia and she reports some blood with her emesis.     Carlyle Dolly MD

## 2021-08-01 NOTE — ED Notes (Signed)
Date and time results received: 08/01/21 6:35 PM Test: troponin Critical Value: 109  Name of Provider Notified: Emokpae  Orders Received? Or Actions Taken? See orders

## 2021-08-01 NOTE — ED Notes (Signed)
EMS reports daughter gave pt trazodone prior to calling EMS because pt did not take last night

## 2021-08-01 NOTE — H&P (Signed)
History and Physical    Melissa Lopez ZOX:[id number] DOB: [birth year] DOA: 08/01/2021  PCP: [facility]   Patient coming from: Home  I have personally briefly reviewed patient's old medical records in [place]  Chief Complaint: Vomiting blood  HPI: Melissa Lopez is a 70 y.o. female with medical history significant for coronary artery disease, diabetes mellitus with nephropathy, CKD 3, diastolic CHF, hypertension, hypertrophic cardiomyopathy. Patient was brought to the ED by EMS for reports of vomiting blood.  At time of my evaluation, patient is drowsy.  History is obtained with help of daughter at bedside. Daughter reports since Friday, patient has been vomiting daily, and on Sunday patient vomited all day.  This morning before 7:30 in the morning, patient vomited blood.  Vomitus was frank blood, contained clots, large amounts, daughter reports at least a cup and a half of blood.  No prior episodes of vomiting blood.  No history of black stools.  No blood in stools.  Denies NSAID use. Patient reports upper abdominal pain.  Recent hospitalization 7/5 -7/11-admitted to [facility] and managed for multifocal encephalopathy thought secondary to UTI, hypercalcemia hypovolemia and hospital induced delirium and  convulsive syncope.  She was evaluated by neurology and it was thought that her shaking episodes were secondary to medical condition/dehydration and no neurological condition.  ED Course: Blood pressure 103-193.  Not tachycardic.  Temperature 98.4.  O2 sats greater than 95% on room air.  Hemoglobin stable at 16.  Significant leukocytosis of 27.9.  Potassium 2.7.  Creatinine elevated 2.46. EKG showed concerns for ST elevations in lead V3 through V4, cardiologist Dr. [name] at [facility] was consulted, on chart review, patient has had 2 similar presentations of vomiting with similar ST abnormalities in 2019 and 2021, resulting in cardiac cath both times  did not show significant disease,.  Recommended not transferring patient to [facility]. Dr. Harl Bowie formally consulted-recommended trending troponin, obtain limited echo, evidence of apical hypertrophy could explain EKG changes, mild elevation in troponin likely demand ischemia, no anticoagulation.  Abdominal CT negative showed moderate colonic stool burden, otherwise negative for acute abnormality.  Chest x-ray unremarkable.  Potassium supplementation started.  Zosyn given prophylactically for leukocytosis.  EDP paged GI, awaiting callback. Hospitalist to admit.  Review of Systems: As per HPI all other systems reviewed and negative.  Past Medical History:  Diagnosis Date   CKD (chronic kidney disease) stage 3, GFR 30-59 ml/min (HCC) 10/31/2018   - per patient report   Coronary artery disease    per patient report - 2 caths with non-obstructive CAD   Essential hypertension    Frequent falls    H/O cardiac pacemaker 10/31/2018   implanted 05/2010   Sick sinus syndrome ([place])    Type 2 diabetes mellitus with complication, with long-term current use of insulin ([place]) 10/31/2018   with diabetic nephropathy    Past Surgical History:  Procedure Laterality Date   BIOPSY  09/28/2020   Procedure: BIOPSY;  Surgeon: Ronald Lobo, MD;  Location: [facility];  Service: Endoscopy;;   CARDIAC PACEMAKER PLACEMENT  2008   in [place] for sick sinus syndrome   CORONARY/GRAFT ACUTE MI REVASCULARIZATION N/A 11/09/2020   Procedure: CORONARY/GRAFT ACUTE MI REVASCULARIZATION;  Surgeon: Leonie Man, MD;  Location: [facility];  Service: Cardiovascular;  Laterality: N/A;   ESOPHAGOGASTRODUODENOSCOPY N/A 09/28/2020   Procedure: ESOPHAGOGASTRODUODENOSCOPY (EGD);  Surgeon: Ronald Lobo, MD;  Location: [facility];  Service: Endoscopy;  Laterality: N/A;  ESOPHAGOGASTRODUODENOSCOPY (EGD) WITH PROPOFOL N/A 11/10/2018   Procedure: ESOPHAGOGASTRODUODENOSCOPY (EGD) WITH PROPOFOL;  Surgeon: Wilford Corner, MD;  Location: [facility];  Service: Endoscopy;  Laterality: N/A;   LEFT HEART CATH AND CORONARY ANGIOGRAPHY N/A 10/31/2018   Procedure: LEFT HEART CATH AND CORONARY ANGIOGRAPHY;  Surgeon: Leonie Man, MD;  Location: [facility];  Service: Cardiovascular;  Laterality: N/A;   PACEMAKER GENERATOR CHANGE  09/2018   in [place]     reports that she quit smoking about 51 years ago. Her smoking use included cigarettes. She has never used smokeless tobacco. She reports that she does not currently use alcohol. She reports that she does not currently use drugs.  Allergies  Allergen Reactions   Contrast Media [Iodinated Diagnostic Agents] Swelling    Contrast Dye - the one made out of shellfish, gives a really bad reaction - swelling   Oxycodone-Acetaminophen Anaphylaxis   Xarelto  [Rivaroxaban] Swelling   Oxycodone Other (See Comments)    Statused by Person:  EAST, CAMELIA(CE) on [id number]    Family History  Problem Relation Age of Onset   Breast cancer Mother    Bone cancer Mother    Heart attack Father 23   Breast cancer Sister    Heart disease Brother        stents   Other Sister        MVA   Diabetes Brother    Prior to Admission medications   Medication Sig Start Date End Date Taking? Authorizing Provider  acetaminophen (TYLENOL) 325 MG tablet Take 650 mg by mouth 2 (two) times daily.   Yes [provider]  amLODipine (NORVASC) 10 MG tablet TAKE 1 TABLET BY MOUTH EVERY DAY 10/10/20  Yes Imogene Burn, PA-C  aspirin 81 MG chewable tablet Chew 1 tablet (81 mg total) by mouth daily. 11/13/18  Yes Dessa Phi, DO  carvedilol (COREG) 6.25 MG tablet Take 6.25 mg by mouth 2 (two) times daily with a meal.   Yes [provider]  Cholecalciferol 1.25 MG (50000 UT) capsule Take 50,000 Units by mouth every Sunday.   Yes [provider]  cinacalcet (SENSIPAR) 30 MG tablet Take 1 tablet (30 mg total) by mouth 2 (two) times daily  with a meal. 06/18/21  Yes Madalyn Rob, MD  cyclobenzaprine (FLEXERIL) 5 MG tablet Take 5 mg by mouth at bedtime.   Yes [provider]  docusate sodium (COLACE) 100 MG capsule Take 100 mg by mouth 2 (two) times daily.   Yes [provider]  escitalopram (LEXAPRO) 20 MG tablet Take 20 mg by mouth daily. 08/11/20  Yes [provider]  famotidine (PEPCID) 20 MG tablet Take 1 tablet (20 mg total) by mouth daily. 06/18/21  Yes Madalyn Rob, MD  gabapentin (NEURONTIN) 100 MG capsule Take 100 mg by mouth 3 (three) times daily. 08/11/20  Yes [provider]  lisinopril (ZESTRIL) 10 MG tablet Take 1 tablet (10 mg total) by mouth daily. 06/25/21 09/23/21 Yes Imogene Burn, PA-C  Multiple Vitamin (MULTIVITAMIN WITH MINERALS) TABS tablet Take 1 tablet by mouth daily. 09/30/20  Yes Hongalgi, Lenis Dickinson, MD  ondansetron (ZOFRAN) 4 MG tablet Take 1 tablet (4 mg total) by mouth every 6 (six) hours. 10/28/20  Yes Couture, Cortni S, PA-C  oxybutynin (DITROPAN-XL) 5 MG 24 hr tablet Take 5 mg by mouth daily.   Yes [provider]  rosuvastatin (CRESTOR) 5 MG tablet Take one tablet daily Patient taking differently: Take 5  mg by mouth daily. 10/10/20  Yes Imogene Burn, PA-C  traZODone (DESYREL) 50 MG tablet Take 50 mg by mouth at bedtime.   Yes [provider]  HYDROcodone-Acetaminophen 5-300 MG TABS Take 1 tablet by mouth 2 (two) times daily. Patient not taking: Reported on 08/01/2021 01/23/21   [provider]  insulin degludec (TRESIBA FLEXTOUCH) 100 UNIT/ML FlexTouch Pen Inject 5 Units into the skin daily. Patient not taking: Reported on 08/01/2021    [provider]  Nutritional Supplements (,FEEDING SUPPLEMENT, PROSOURCE PLUS) liquid Take 30 mLs by mouth daily. 09/29/20   Hongalgi, Lenis Dickinson, MD  pantoprazole (PROTONIX) 20 MG tablet Take 1 tablet (20 mg total) by mouth daily. Patient not taking: Reported on 08/01/2021 06/19/21   Madalyn Rob, MD     Physical Exam: Vitals:   08/01/21 1215 08/01/21 1228 08/01/21 1229 08/01/21 1358  BP: 103/83   (!) 193/109  Pulse: 69   76  Resp: 16   13  Temp: 98.4 F (36.9 C)     TempSrc: Oral     SpO2: 95% 95%  98%  Weight:   65.3 kg   Height:   5\' 4"  (1.626 m)     Constitutional: Drowsy, easily falls asleep, arouses to voice (daughter had given patient trazodone prior to arrival in the ED) Vitals:   08/01/21 1215 08/01/21 1228 08/01/21 1229 08/01/21 1358  BP: 103/83   (!) 193/109  Pulse: 69   76  Resp: 16   13  Temp: 98.4 F (36.9 C)     TempSrc: Oral     SpO2: 95% 95%  98%  Weight:   65.3 kg   Height:   5\' 4"  (1.626 m)    Eyes: PERRL, lids and conjunctivae normal ENMT: Mucous membranes are dry..  Neck: normal, supple, no masses, no thyromegaly Respiratory: clear to auscultation bilaterally, no wheezing, no crackles. Normal respiratory effort. No accessory muscle use.  Cardiovascular: Regular rate and rhythm, no murmurs / rubs / gallops. No extremity edema. 2+ pedal pulses.  Abdomen: Moderate epigastric tenderness ,  no masses palpated. No hepatosplenomegaly. Bowel sounds positive.  Patient's daughter shows me a picture of the amount of blood patient vomited-pretty significant amount, dark red with clots present. Musculoskeletal: no clubbing / cyanosis. No joint deformity upper and lower extremities. Good ROM, no contractures. Normal muscle tone.  Skin: no rashes, lesions, ulcers. No induration Neurologic: No apparent cranial nerve abnormality, moving extremities spontaneously. Psychiatric: Normal judgment and insight. Alert and oriented x 3. Normal mood.   Labs on Admission: I have personally reviewed following labs and imaging studies  CBC: Recent Labs  Lab 08/01/21 1254 08/01/21 1304  WBC 27.9*  --   NEUTROABS 23.3*  --   HGB 16.0* 16.0*  HCT 46.5* 47.0*  MCV 93.0  --   PLT 227  --    Basic Metabolic Panel: Recent Labs  Lab 08/01/21 1254 08/01/21 1304  NA 136  139  K 2.7* 2.8*  CL 99 103  CO2 26  --   GLUCOSE 147* 148*  BUN 61* 55*  CREATININE 2.46* 2.50*  CALCIUM 12.0*  --    Liver Function Tests: Recent Labs  Lab 08/01/21 1254  AST 25  ALT 24  ALKPHOS 85  BILITOT 1.2  PROT 8.6*  ALBUMIN 4.6    Radiological Exams on Admission: CT ABDOMEN PELVIS WO CONTRAST  Result Date: 08/01/2021 CLINICAL DATA:  Abdominal pain, nausea and vomiting for 3 days EXAM: CT ABDOMEN AND PELVIS  WITHOUT CONTRAST TECHNIQUE: Multidetector CT imaging of the abdomen and pelvis was performed following the standard protocol without IV contrast. COMPARISON:  07/31/2021, 06/13/2021, 11/09/2020 FINDINGS: Lower chest: No acute pleural or parenchymal lung disease. Stable pacemaker. Unenhanced CT was performed per clinician order. Lack of IV contrast limits sensitivity and specificity, especially for evaluation of abdominal/pelvic solid viscera. Hepatobiliary: No focal liver abnormality is seen. Status post cholecystectomy. Stable postsurgical dilatation of the common bile duct. Pancreas: Unremarkable. No pancreatic ductal dilatation or surrounding inflammatory changes. Spleen: Normal in size without focal abnormality. Adrenals/Urinary Tract: Innumerable bilateral renal cortical cysts are identified, some displaying thin peripheral calcifications. Evaluation is incomplete on unenhanced exam, but there is no gross change since the preceding study. No urinary tract calculi or obstructive uropathy. The adrenals and bladder are stable. Stomach/Bowel: No bowel obstruction or ileus. Moderate retained stool throughout the colon. The appendix, if still present, is not well visualized. No bowel wall thickening or inflammatory change. Vascular/Lymphatic: Aortic atherosclerosis. No enlarged abdominal or pelvic lymph nodes. Reproductive: Status post hysterectomy. No adnexal masses. Other: No free fluid or free gas.  No abdominal wall hernia. Musculoskeletal: There are no acute or destructive bony  lesions. Stable degenerative changes of the lumbar spine and bilateral hips. Reconstructed images demonstrate no additional findings. IMPRESSION: 1. No acute intra-abdominal or intrapelvic process. 2. Stable mildly complex bilateral renal cysts, incompletely evaluated by unenhanced exam. 3. No bowel obstruction or ileus. Moderate residual stool throughout the colon. 4.  Aortic Atherosclerosis (ICD10-I70.0). Electronically Signed   By: Randa Ngo M.D.   On: 08/01/2021 17:01   DG Abd 1 View  Result Date: 08/01/2021 CLINICAL DATA:  Abdominal pain, nausea, constipation EXAM: ABDOMEN - 1 VIEW COMPARISON:  KUB 06/14/2021 FINDINGS: There is a nonobstructive bowel gas pattern. There is a moderate colonic stool burden projecting over the rectum and right hemiabdomen. There is no gross organomegaly or abnormal soft tissue calcification. There is no acute osseous abnormality. IMPRESSION: Moderate colonic stool burden as above without evidence of mechanical obstruction. Electronically Signed   By: Valetta Mole M.D.   On: 08/01/2021 13:39   DG Chest Port 1 View  Result Date: 08/01/2021 CLINICAL DATA:  Weakness.  Abdominal pain.  Nausea and vomiting. EXAM: PORTABLE CHEST 1 VIEW COMPARISON:  CT chest 06/13/2021 FINDINGS: Heart size is normal. Lungs are clear. No focal nodule, mass, or airspace disease is present. No edema or effusion is present. Pacemaker wires are stable. IMPRESSION: No acute cardiopulmonary disease or significant interval change. Electronically Signed   By: San Morelle M.D.   On: 08/01/2021 15:08   ECHOCARDIOGRAM LIMITED  Result Date: 08/01/2021    ECHOCARDIOGRAM LIMITED REPORT   Patient Name:   ELAURA CALIX Date of Exam: 08/01/2021 Medical Rec #:  [id number]        Height:       64.0 in Accession #:    [id number]       Weight:       144.0 lb Date of Birth:  [birth year]        BSA:          1.701 m Patient Age:    43 years         BP:           188/104 mmHg Patient Gender: F                 HR:           68 bpm. Exam Location:  [facility]  Procedure: 2D Echo, Cardiac Doppler and Color Doppler Indications:    Abnormal ECG R94.31  History:        Patient has prior history of Echocardiogram examinations, most                 recent 06/13/2021. Hypertrophic Cardiomyopathy, CAD and STEMI (ST                 elevation myocardial infarction), Pacemaker; Risk                 Factors:Hypertension and Diabetes. Sick sinus syndrome (HCC)                 (From Hx).  Sonographer:    Alvino Chapel RCS Referring Phys: [id number] [place]  1. There is moderate asymmsetric septal hypertrophy. The apex is not completely visualized but appears to have severe apical hypertrophy with systolic apical obliteration but no significant intracavitary gradient. Findings consistent with apical hypertrophic cardiomyopathy. . Left ventricular ejection fraction, by estimation, is 65 to 70%. The left ventricle has normal function. The left ventricle has no regional wall motion abnormalities. Left ventricular diastolic parameters are consistent with Grade I diastolic dysfunction (impaired relaxation).  2. Right ventricular systolic function is normal. The right ventricular size is normal.  3. Limited study to evaluate LV function FINDINGS  Left Ventricle: There is moderate asymmsetric septal hypertrophy. The apex is not completely visualized but appears to have severe apical hypertrophy with systolic apical obliteration but no significant intracavitary gradient. Findings consistent with apical hypertrophic cardiomyopathy. Left ventricular ejection fraction, by estimation, is 65 to 70%. The left ventricle has normal function. The left ventricle has no regional wall motion abnormalities. Left ventricular diastolic parameters are consistent with Grade I diastolic dysfunction (impaired relaxation). Right Ventricle: The right ventricular size is normal. No increase in right ventricular wall thickness. Right ventricular  systolic function is normal. LEFT VENTRICLE PLAX 2D LVIDd:         4.07 cm  Diastology LVIDs:         2.71 cm  LV e' lateral:   4.82 cm/s LV PW:         1.09 cm  LV E/e' lateral: 10.6 LV IVS:        1.46 cm LVOT diam:     2.30 cm LV SV:         86 LV SV Index:   51 LVOT Area:     4.15 cm  RIGHT VENTRICLE RV S prime:     12.90 cm/s TAPSE (M-mode): 1.5 cm LEFT ATRIUM           Index       RIGHT ATRIUM           Index LA diam:      3.20 cm 1.88 cm/m  RA Area:     11.50 cm LA Vol (A4C): 29.9 ml 17.58 ml/m RA Volume:   26.20 ml  15.40 ml/m  AORTIC VALVE LVOT Vmax:   120.00 cm/s LVOT Vmean:  76.100 cm/s LVOT VTI:    0.208 m  AORTA Ao Root diam: 3.60 cm MITRAL VALVE MV Area (PHT): 3.15 cm     SHUNTS MV Decel Time: 241 msec     Systemic VTI:  0.21 m MV E velocity: 51.00 cm/s   Systemic Diam: 2.30 cm MV A velocity: 115.00 cm/s MV E/A ratio:  0.44 Carlyle Dolly MD Electronically signed by Carlyle Dolly MD Signature Date/Time: 08/01/2021/3:51:22 PM  Final     EKG: Independently reviewed.  Sinus rhythm, rate 75.  QTc 408.  ST elevations in V3 through V4, mild elevations in 2 3 aVF.  T wave abnormalities present also.  Assessment/Plan Principal Problem:   Hematemesis Active Problems:   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HCC)   CKD (chronic kidney disease) stage 3, GFR 30-59 ml/min (HCC) - per patient report   Essential hypertension   Cardiomyopathy, 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[place])   Chronic diastolic CHF (congestive heart failure) ([place])   Primary hypertension   Hematemesis-vitals above, hemoglobin 16 likely hemoconcentrated >> 13.  Denies NSAID use, no melena or hematochezia.  She is on aspirin.  No history to suggest cirrhosis, no prior GI blood loss history.  Patient had multiple prior episodes of vomiting before 2 episodes of hematemesis. Differentials include Mallory-Weiss tear, peptic ulcer disease.  -Last EGD done for nausea and vomiting 09/2020 presence of bile reflux, suggest gastric dysmotility  or bile reflux gastritis. -Trend hemoglobin -IV Protonix 40 twice daily - Paged GI on-call twice, awaiting call back. -Transfuse for hemoglobin less than 8 - NPO -Hold aspirin  AKI on CKD 3-creatinine 2.46, baseline 1.4-1.8, likely prerenal due to multiple episodes of vomiting. - 1 L bolus given cont N/s + 20 KCl 100cc/hr x 20hrs  Abnormal EKG, hypertrophic cardiomyopathy-EKG findings of ST elevations, has had 2 prior catheterizations in the setting of vomiting with similar ST elevations-2019, 2021, both negative for significant disease.  Trop 140 > 109.  Echo 06/2021, EF 50 to 55%, severe concentric LV hypertrophy and apical segment. - EDP talked to Dr. [name] cardiologist at [facility], no need to transfer. -Dr. Harl Bowie formally consulted, limited echo ordered, suspect demand ischemia as etiology for mild elevation in troponin, will see in the morning, no need for anticoagulation. - Resume Carvedilol  Pacemaker status  Hypokalemia-potassium 2.7.  Normal magnesium 2.4. -Given sodium replete, cautious due to worsening renal insufficiency  Diabetes mellitus with gastroparesis-random glucose 147.  Gastroparesis likely etiology of her vomiting. - SSi- S q6h - HgbA1c -Hold Tresiba  Hypertension -blood pressure elevated to 190s.  Due to history of convulsive syncope in the setting of orthostatic hypotension, per cardiology notes 7/80/22, patient needs higher systolic blood pressure goal to reduce her risk of falls and syncope.  Patient's amlodipine and hydralazine was discontinued. -NPO, hold Norvasc, lisinopril -As needed hydralazine for systolic greater than 155.  DVT prophylaxis: SCDs Code Status: Full code. Family Communication: Daughter at bedside. Disposition Plan:  ~ /> 2 days Consults called: GI Admission status: Inpatient, telemetry I certify that at the point of admission it is my clinical judgment that the patient will require inpatient hospital care spanning beyond 2 midnights  from the point of admission due to high intensity of service, high risk for further deterioration and high frequency of surveillance required.    Bethena Roys MD [facility]  08/01/2021, 10:34 PM

## 2021-08-01 NOTE — ED Provider Notes (Signed)
[facility] EMERGENCY DEPARTMENT Provider Note   CSN: [id number] Arrival date & time: 08/01/21  1202     History Chief Complaint  Patient presents with   Abdominal Pain   Nausea    Melissa Lopez is a 70 y.o. female.  Patient presents with vomiting for about 5 days.  Patient vomited some blood according to her daughter 2.  Patient complains of lower abdominal pain.  The history is provided by the patient and medical records. No language interpreter was used.  Abdominal Pain Pain location:  Generalized Pain quality: aching   Pain radiates to:  Does not radiate Pain severity:  Moderate Timing:  Constant Progression:  Worsening Chronicity:  New Context: not alcohol use   Relieved by:  Nothing Worsened by:  Nothing Associated symptoms: no chest pain, no cough, no diarrhea, no fatigue and no hematuria       Past Medical History:  Diagnosis Date   CKD (chronic kidney disease) stage 3, GFR 30-59 ml/min (HCC) 10/31/2018   - per patient report   Coronary artery disease    per patient report - 2 caths with non-obstructive CAD   Essential hypertension    Frequent falls    H/O cardiac pacemaker 10/31/2018   implanted 05/2010   Sick sinus syndrome ([place])    Type 2 diabetes mellitus with complication, with long-term current use of insulin ([place]) 10/31/2018   with diabetic nephropathy    Patient Active Problem List   Diagnosis Date Noted   Orthostasis    Seizure-like activity ([place]) 06/13/2021   Loss of weight    Primary hypertension    Chest pain of uncertain etiology 40/98/1191   Primary hyperparathyroidism ([place]) 09/25/2020   Emesis    Hypophosphatemia    Hypokalemia 09/22/2020   Chronic diastolic CHF (congestive heart failure) ([place]) 09/22/2020   Noninfective gastroenteritis and colitis, unspecified 04/13/2020   Unilateral primary osteoarthritis, right knee 04/13/2020   Arthritis 04/13/2020   Bradycardia 04/13/2020   Cardiomyopathy, 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[place]) 04/13/2020    Class 1 obesity due to excess calories with body mass index (BMI) of 34.0 to 34.9 in adult 04/13/2020   Acute kidney failure, unspecified ([place]) 04/29/2019   Intractable nausea and vomiting 11/10/2018   Malnutrition of moderate degree 11/09/2018   AKI (acute kidney injury) ([place])    PE (pulmonary thromboembolism) ([place])    H/O cardiac pacemaker 10/31/2018   Syncope 10/31/2018   ST elevation on EKG without MI 10/31/2018   Chest pain with high risk of acute coronary syndrome 10/31/2018   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[place]) 10/31/2018   Type 2 diabetes mellitus with complication, with long-term current use of insulin ([place]) 10/31/2018   CKD (chronic kidney disease) stage 3, GFR 30-59 ml/min (HCC) - per patient report 10/31/2018   Presence of cardiac pacemaker 10/31/2018   Essential hypertension    Hypercalcemia 07/27/2015    Past Surgical History:  Procedure Laterality Date   BIOPSY  09/28/2020   Procedure: BIOPSY;  Surgeon: Ronald Lobo, MD;  Location: [facility];  Service: Endoscopy;;   CARDIAC PACEMAKER PLACEMENT  2008   in [place] for sick sinus syndrome   CORONARY/GRAFT ACUTE MI REVASCULARIZATION N/A 11/09/2020   Procedure: CORONARY/GRAFT ACUTE MI REVASCULARIZATION;  Surgeon: Leonie Man, MD;  Location: [facility];  Service: Cardiovascular;  Laterality: N/A;   ESOPHAGOGASTRODUODENOSCOPY N/A 09/28/2020   Procedure: ESOPHAGOGASTRODUODENOSCOPY (EGD);  Surgeon: Ronald Lobo, MD;  Location: [facility];  Service: Endoscopy;  Laterality: N/A;   ESOPHAGOGASTRODUODENOSCOPY (EGD)  WITH PROPOFOL N/A 11/10/2018   Procedure: ESOPHAGOGASTRODUODENOSCOPY (EGD) WITH PROPOFOL;  Surgeon: Wilford Corner, MD;  Location: [facility];  Service: Endoscopy;  Laterality: N/A;   LEFT HEART CATH AND CORONARY ANGIOGRAPHY N/A 10/31/2018   Procedure: LEFT HEART CATH AND CORONARY ANGIOGRAPHY;  Surgeon: Leonie Man, MD;  Location: [facility];  Service:  Cardiovascular;  Laterality: N/A;   PACEMAKER GENERATOR CHANGE  09/2018   in [place]     OB History   No obstetric history on file.     Family History  Problem Relation Age of Onset   Breast cancer Mother    Bone cancer Mother    Heart attack Father 36   Breast cancer Sister    Heart disease Brother        stents   Other Sister        MVA   Diabetes Brother     Social History   Tobacco Use   Smoking status: Former    Years: 3.00    Types: Cigarettes    Quit date: 12/09/1969    Years since quitting: 51.6   Smokeless tobacco: Never  Vaping Use   Vaping Use: Never used  Substance Use Topics   Alcohol use: Not Currently   Drug use: Not Currently    Comment: as teenager    Home Medications Prior to Admission medications   Medication Sig Start Date End Date Taking? Authorizing Provider  acetaminophen (TYLENOL) 325 MG tablet Take 650 mg by mouth 2 (two) times daily.   Yes [provider]  amLODipine (NORVASC) 10 MG tablet TAKE 1 TABLET BY MOUTH EVERY DAY 10/10/20  Yes Imogene Burn, PA-C  aspirin 81 MG chewable tablet Chew 1 tablet (81 mg total) by mouth daily. 11/13/18  Yes Dessa Phi, DO  carvedilol (COREG) 6.25 MG tablet Take 6.25 mg by mouth 2 (two) times daily with a meal.   Yes [provider]  Cholecalciferol 1.25 MG (50000 UT) capsule Take 50,000 Units by mouth every Sunday.   Yes [provider]  cinacalcet (SENSIPAR) 30 MG tablet Take 1 tablet (30 mg total) by mouth 2 (two) times daily with a meal. 06/18/21  Yes Madalyn Rob, MD  cyclobenzaprine (FLEXERIL) 5 MG tablet Take 5 mg by mouth at bedtime.   Yes [provider]  docusate sodium (COLACE) 100 MG capsule Take 100 mg by mouth 2 (two) times daily.   Yes [provider]  escitalopram (LEXAPRO) 20 MG tablet Take 20 mg by mouth daily. 08/11/20  Yes [provider]  famotidine (PEPCID) 20 MG tablet Take 1 tablet (20 mg total) by mouth daily. 06/18/21   Yes Madalyn Rob, MD  gabapentin (NEURONTIN) 100 MG capsule Take 100 mg by mouth 3 (three) times daily. 08/11/20  Yes [provider]  lisinopril (ZESTRIL) 10 MG tablet Take 1 tablet (10 mg total) by mouth daily. 06/25/21 09/23/21 Yes Imogene Burn, PA-C  Multiple Vitamin (MULTIVITAMIN WITH MINERALS) TABS tablet Take 1 tablet by mouth daily. 09/30/20  Yes Hongalgi, Lenis Dickinson, MD  ondansetron (ZOFRAN) 4 MG tablet Take 1 tablet (4 mg total) by mouth every 6 (six) hours. 10/28/20  Yes Couture, Cortni S, PA-C  oxybutynin (DITROPAN-XL) 5 MG 24 hr tablet Take 5 mg by mouth daily.   Yes [provider]  rosuvastatin (CRESTOR) 5 MG tablet Take one tablet daily Patient taking differently: Take 5 mg by mouth daily. 10/10/20  Yes Imogene Burn, PA-C  traZODone (  DESYREL) 50 MG tablet Take 50 mg by mouth at bedtime.   Yes [provider]  HYDROcodone-Acetaminophen 5-300 MG TABS Take 1 tablet by mouth 2 (two) times daily. Patient not taking: Reported on 08/01/2021 01/23/21   [provider]  insulin degludec (TRESIBA FLEXTOUCH) 100 UNIT/ML FlexTouch Pen Inject 5 Units into the skin daily. Patient not taking: Reported on 08/01/2021    [provider]  Nutritional Supplements (,FEEDING SUPPLEMENT, PROSOURCE PLUS) liquid Take 30 mLs by mouth daily. 09/29/20   Hongalgi, Lenis Dickinson, MD  pantoprazole (PROTONIX) 20 MG tablet Take 1 tablet (20 mg total) by mouth daily. Patient not taking: Reported on 08/01/2021 06/19/21   Madalyn Rob, MD    Allergies    Contrast media [iodinated diagnostic agents], Oxycodone-acetaminophen, Xarelto  [rivaroxaban], and Oxycodone  Review of Systems   Review of Systems  Constitutional:  Negative for appetite change and fatigue.  HENT:  Negative for congestion, ear discharge and sinus pressure.   Eyes:  Negative for discharge.  Respiratory:  Negative for cough.   Cardiovascular:  Negative for chest pain.  Gastrointestinal:  Positive for  abdominal pain. Negative for diarrhea.  Genitourinary:  Negative for frequency and hematuria.  Musculoskeletal:  Negative for back pain.  Skin:  Negative for rash.  Neurological:  Negative for seizures and headaches.  Psychiatric/Behavioral:  Negative for hallucinations.    Physical Exam Updated Vital Signs BP (!) 193/109   Pulse 76   Temp 98.4 F (36.9 C) (Oral)   Resp 13   Ht 5\' 4"  (1.626 m)   Wt 65.3 kg   SpO2 98%   BMI 24.71 kg/m   Physical Exam Vitals and nursing note reviewed.  Constitutional:      Appearance: She is well-developed.  HENT:     Head: Normocephalic.     Nose: Nose normal.  Eyes:     General: No scleral icterus.    Conjunctiva/sclera: Conjunctivae normal.  Neck:     Thyroid: No thyromegaly.  Cardiovascular:     Rate and Rhythm: Normal rate and regular rhythm.     Heart sounds: No murmur heard.   No friction rub. No gallop.  Pulmonary:     Breath sounds: No stridor. No wheezing or rales.  Chest:     Chest wall: No tenderness.  Abdominal:     General: There is no distension.     Tenderness: There is no abdominal tenderness. There is no rebound.  Musculoskeletal:        General: Normal range of motion.     Cervical back: Neck supple.  Lymphadenopathy:     Cervical: No cervical adenopathy.  Skin:    Findings: No erythema or rash.  Neurological:     Mental Status: She is alert and oriented to person, place, and time.     Motor: No abnormal muscle tone.     Coordination: Coordination normal.  Psychiatric:        Behavior: Behavior normal.    ED Results / Procedures / Treatments   Labs (all labs ordered are listed, but only abnormal results are displayed) Labs Reviewed  CBC WITH DIFFERENTIAL/PLATELET - Abnormal; Notable for the following components:      Result Value   WBC 27.9 (*)    Hemoglobin 16.0 (*)    HCT 46.5 (*)    Neutro Abs 23.3 (*)    Monocytes Absolute 1.9 (*)    Abs Immature Granulocytes 0.29 (*)    All other components  within normal limits  COMPREHENSIVE METABOLIC PANEL - Abnormal; Notable for the following components:   Potassium 2.7 (*)    Glucose, Bld 147 (*)    BUN 61 (*)    Creatinine, Ser 2.46 (*)    Calcium 12.0 (*)    Total Protein 8.6 (*)    GFR, Estimated 21 (*)    All other components within normal limits  I-STAT CHEM 8, ED - Abnormal; Notable for the following components:   Potassium 2.8 (*)    BUN 55 (*)    Creatinine, Ser 2.50 (*)    Glucose, Bld 148 (*)    Calcium, Ion 1.49 (*)    Hemoglobin 16.0 (*)    HCT 47.0 (*)    All other components within normal limits  POC OCCULT BLOOD, ED - Abnormal; Notable for the following components:   Fecal Occult Bld POSITIVE (*)    All other components within normal limits  TROPONIN I (HIGH SENSITIVITY) - Abnormal; Notable for the following components:   Troponin I (High Sensitivity) 140 (*)    All other components within normal limits  RESP PANEL BY RT-PCR (FLU A&B, COVID) ARPGX2  CULTURE, BLOOD (ROUTINE X 2)  CULTURE, BLOOD (ROUTINE X 2)  URINALYSIS, ROUTINE W REFLEX MICROSCOPIC  OCCULT BLOOD X 1 CARD TO LAB, STOOL  TYPE AND SCREEN  TROPONIN I (HIGH SENSITIVITY)    EKG None  Radiology DG Abd 1 View  Result Date: 08/01/2021 CLINICAL DATA:  Abdominal pain, nausea, constipation EXAM: ABDOMEN - 1 VIEW COMPARISON:  KUB 06/14/2021 FINDINGS: There is a nonobstructive bowel gas pattern. There is a moderate colonic stool burden projecting over the rectum and right hemiabdomen. There is no gross organomegaly or abnormal soft tissue calcification. There is no acute osseous abnormality. IMPRESSION: Moderate colonic stool burden as above without evidence of mechanical obstruction. Electronically Signed   By: Valetta Mole M.D.   On: 08/01/2021 13:39    Procedures Procedures   Medications Ordered in ED Medications  potassium chloride 10 mEq in 100 mL IVPB (10 mEq Intravenous New Bag/Given 08/01/21 1419)  potassium chloride 10 mEq in 100 mL IVPB (has  no administration in time range)  piperacillin-tazobactam (ZOSYN) IVPB 3.375 g (has no administration in time range)  sodium chloride 0.9 % bolus 1,000 mL (0 mLs Intravenous Stopped 08/01/21 1420)  pantoprazole (PROTONIX) injection 40 mg (40 mg Intravenous Given 08/01/21 1359)  ondansetron (ZOFRAN) injection 4 mg (4 mg Intravenous Given 08/01/21 1404)   CRITICAL CARE Performed by: Milton Ferguson Total critical care time: 40 minutes Critical care time was exclusive of separately billable procedures and treating other patients. Critical care was necessary to treat or prevent imminent or life-threatening deterioration. Critical care was time spent personally by me on the following activities: development of treatment plan with patient and/or surrogate as well as nursing, discussions with consultants, evaluation of patient's response to treatment, examination of patient, obtaining history from patient or surrogate, ordering and performing treatments and interventions, ordering and review of laboratory studies, ordering and review of radiographic studies, pulse oximetry and re-evaluation of patient's condition. Patient presented with abdominal pain.  EKG suggested possible STEMI.  I spoke with the STEMI Dr. Claiborne Billings.  He reviewed the EKG and reviewed previous EKGs and stated she had gone to the Cath Lab twice with similar EKG changes.  Both times the patient was not having a STEMI.  This is the third time she presents with these EKG changes that he does not believe the patient needs to go to  [facility] for evaluation for STEMI.  Patient will be evaluated by Dr. Harl Bowie and cardiology.  Patient also getting CT of the abdomen to help evaluate the abdominal discomfort and vomiting blood.  We will consult GI also ED Course  I have reviewed the triage vital signs and the nursing notes.  Pertinent labs & imaging results that were available during my care of the patient were reviewed by me and considered in my medical  decision making (see chart for details).    MDM Rules/Calculators/A&P                           Abdominal pain and vomiting.  Elevated troponin with ST elevation.  She is admitted by medicine with cardiology consult Final Clinical Impression(s) / ED Diagnoses Final diagnoses:  Weakness    Rx / DC Orders ED Discharge Orders     None        Milton Ferguson, MD 08/03/21 [phone]

## 2021-08-01 NOTE — Progress Notes (Signed)
*  PRELIMINARY RESULTS* Echocardiogram 2D Echocardiogram has been performed.  Melissa Lopez 08/01/2021, 3:40 PM

## 2021-08-01 NOTE — ED Notes (Signed)
Pt does not have pacemaker card on her and unable to remember brand. Requested daughter bring card to ED so it can be interrogated

## 2021-08-01 NOTE — ED Notes (Signed)
Pt difficult stick.  Unable to get blood cultures within reasonable time.

## 2021-08-02 ENCOUNTER — Encounter (HOSPITAL_COMMUNITY): Payer: Self-pay | Admitting: Internal Medicine

## 2021-08-02 DIAGNOSIS — N179 Acute kidney failure, unspecified: Secondary | ICD-10-CM

## 2021-08-02 DIAGNOSIS — K920 Hematemesis: Secondary | ICD-10-CM | POA: Diagnosis not present

## 2021-08-02 DIAGNOSIS — N1832 Chronic kidney disease, stage 3b: Secondary | ICD-10-CM

## 2021-08-02 DIAGNOSIS — E876 Hypokalemia: Secondary | ICD-10-CM

## 2021-08-02 DIAGNOSIS — Z950 Presence of cardiac pacemaker: Secondary | ICD-10-CM

## 2021-08-02 LAB — BASIC METABOLIC PANEL
Anion gap: 8 (ref 5–15)
BUN: 49 mg/dL — ABNORMAL HIGH (ref 8–23)
CO2: 24 mmol/L (ref 22–32)
Calcium: 11.1 mg/dL — ABNORMAL HIGH (ref 8.9–10.3)
Chloride: 109 mmol/L (ref 98–111)
Creatinine, Ser: 2.04 mg/dL — ABNORMAL HIGH (ref 0.44–1.00)
GFR, Estimated: 26 mL/min — ABNORMAL LOW (ref >60)
Glucose, Bld: 129 mg/dL — ABNORMAL HIGH (ref 70–99)
Potassium: 3 mmol/L — ABNORMAL LOW (ref 3.5–5.1)
Sodium: 141 mmol/L (ref 135–145)

## 2021-08-02 LAB — URINALYSIS, COMPLETE (UACMP) WITH MICROSCOPIC
Bilirubin Urine: NEGATIVE
Glucose, UA: 50 mg/dL — AB
Hgb urine dipstick: NEGATIVE
Ketones, ur: NEGATIVE mg/dL
Nitrite: NEGATIVE
Protein, ur: 100 mg/dL — AB
Specific Gravity, Urine: 1.013 (ref 1.005–1.030)
pH: 7 (ref 5.0–8.0)

## 2021-08-02 LAB — CBC
HCT: 40.4 % (ref 36.0–46.0)
Hemoglobin: 13.7 g/dL (ref 12.0–15.0)
MCH: 32.1 pg (ref 26.0–34.0)
MCHC: 33.9 g/dL (ref 30.0–36.0)
MCV: 94.6 fL (ref 80.0–100.0)
Platelets: 170 10*3/uL (ref 150–400)
RBC: 4.27 MIL/uL (ref 3.87–5.11)
RDW: 13.7 % (ref 11.5–15.5)
WBC: 20.6 10*3/uL — ABNORMAL HIGH (ref 4.0–10.5)
nRBC: 0 % (ref 0.0–0.2)

## 2021-08-02 LAB — HEMOGLOBIN A1C
Hgb A1c MFr Bld: 5.5 % (ref 4.8–5.6)
Mean Plasma Glucose: 111.15 mg/dL

## 2021-08-02 LAB — MRSA NEXT GEN BY PCR, NASAL: MRSA by PCR Next Gen: NOT DETECTED

## 2021-08-02 LAB — GLUCOSE, CAPILLARY
Glucose-Capillary: 114 mg/dL — ABNORMAL HIGH (ref 70–99)
Glucose-Capillary: 118 mg/dL — ABNORMAL HIGH (ref 70–99)

## 2021-08-02 MED ORDER — SODIUM CHLORIDE 0.9 % IV SOLN
12.5000 mg | Freq: Four times a day (QID) | INTRAVENOUS | Status: DC | PRN
  Filled 2021-08-02: qty 0.5

## 2021-08-02 MED ORDER — LINACLOTIDE 72 MCG PO CAPS
72.0000 ug | ORAL_CAPSULE | Freq: Every day | ORAL | Status: DC
  Administered 2021-08-02 – 2021-08-03 (×2): 72 ug via ORAL
  Filled 2021-08-02 (×5): qty 1

## 2021-08-02 NOTE — Consult Note (Signed)
Referring Provider: Orson Eva, MD Primary Care Physician:  [facility], [place] Primary Gastroenterologist: Has been evaluated during admissions by both [facility]  Reason for Consultation:  Hematemesis  HPI: Beckey Polkowski is a 70 y.o. female with past medical history significant for diabetes with nephropathy, CAD, CKD 3, diastolic CHF, SSS status post pacemaker, hypertension, hypercalcemia, primary hyperparathyroidism, hypertrophic cardiomyopathy presenting via EMS with complaints of 3-day history of nausea/vomiting/abdominal pain.    Patient is a difficult historian. Initially she was very drowsy but became alert with verbal stimuli. Question reliability of history. I spoke to daughter, [name], by phone and was able to obtain history from her. Patient has long standing history of intermittent vomiting as long as she can remember. Episodes would occur off/on, requiring hospitalization several times. Seemed to be aggravated by stressors.  Patient moved to the area couple of years ago with her daughter.  With this moved she developed increased episodes of nausea and vomiting which has been more refractory to treatment.  Initial hospitalization for nausea and vomiting since she moved here was in 2019 as outlined below.  Daughter believes episodes seem to be exacerbated by either dehydration or with constipation.  She has had significant weight loss over the past 2 years, patient reports "I used to weight 200 and something".  Her records available in November 2019 she weighed 189 pounds, September of last year she weighed 166 pounds, today she weighs 128 pounds.  Daughter has been pushing fluids, Ensure, frequent small meals.  Sometimes patient just does not want to eat because she is fearful of abdominal pain and vomiting.   This episode started Friday morning.  She had a couple episodes of vomiting, was able to get under control with Zofran.  Another episode Saturday  morning.  Sunday she had increased nausea and vomiting and more difficulty keeping fluids down.  Monday she was seen by provider with [facility], daughter felt like she needed admission for dehydration but she was sent home.  Tuesday morning she vomited 1/2 cups of dark congealed blood. Vomitus with frank blood, contained clots, large amounts per admitting attending who reviewed picture provided by daughter.  No blood in the stool or melena.  Denied NSAID use.  Patient also complaining of upper abdominal pain, has been in 2 years per daughter.  She also complained to me of left lower quadrant pain, daughter reports possibly related to her chronic left hip pain.  She also has chronic back pain and has been complaining more of these symptoms lately.  For constipation they have been using Senokot daily, MiraLAX daily without relief.  Never on prescription medications.  Remote colonoscopy 6 or 7 years ago was unremarkable per daughter.  Patient reports a lot of heartburn, dysphagia.  She takes Pepcid twice daily but no PPI.  Daughter notes that her Reglan was stopped back in July at time of discharge, and she is seem to have increased issues since that time.  Historically has used Reglan off and on for months at a time.  Patient previously on metoclopramide but this was discontinued at time of discharge last month when she presented with convulsive syncope, severe hypercalcemia.  In the ED: White blood cell count 27,900, hemoglobin 16, platelets 227,000, potassium 2.7, creatinine 2.46 (baseline between 1.5 and 1.8), BUN 61, troponin I 140--> 109, LFTs normal, magnesium 2.4, SARS coronavirus 2 negative, heme positive stool.  Cardiology contacted regarding EKG with ST elevations, elevated troponin I.  Noted to  have stable ST elevations dating back to 2019 with previous catheterization showing no significant disease.  It was felt that apical hypertrophy may explain EKG findings that elevated troponin likely demand ischemia  in the setting of nausea/vomiting, AKI, leukocytosis.  Trending troponins.  Limited echo showed hyperdynamic LV with apical hypertrophy.  No plans for additional cardiac testing at this time per Dr. Harl Bowie.  Today: Blood cell count 20,600, hemoglobin 13.7, potassium 3, BUN 49, creatinine 2.04.  Abdominal x-ray: IMPRESSION: Moderate colonic stool burden as above without evidence of mechanical obstruction.  CT abdomen pelvis without contrast yesterday IMPRESSION: 1. No acute intra-abdominal or intrapelvic process. 2. Stable mildly complex bilateral renal cysts, incompletely evaluated by unenhanced exam. 3. No bowel obstruction or ileus. Moderate residual stool throughout the colon. 4.  Aortic Atherosclerosis (ICD10-I70.0). 5. Stable postsurgical dilatation of the CBD.  Pertinent previous work up: Hospitalized December 2021 with nausea/vomiting/diarrhea.  History of chronic nausea and vomiting felt to be secondary to gastroparesis plus or minus THC use.  During that hospitalization she reported an involuntary 100 pound weight loss over 2-year period of time approximately.  She did not tolerate a gastric emptying study due to vomiting.  Noncontrast CT scans were unremarkable.  History of stable biliary duct dilation with normal LFTs.  History of mild pancreatic atrophy on CT. Also hospitalized in 11/2018 with N/V.   EGD 11/2018: For nausea/vomiting (inpatient, [facility]) - Normal esophagus. - Z-line regular, 40 cm from the incisors. - Acute gastritis. - Small hiatal hernia. - Mucosal changes in the duodenum. - No specimens collected-  EGD 09/2020: EGD for nausea/vomiting (inpatient, [facility]) - Normal larynx. - Normal esophagus. - Bilious gastric fluid. - Erythematous mucosa in the antrum. Biopsied. - Normal examined duodenum. Biopsied. - The presence of bile reflux suggests gastric dysmotility or bile reflux gastritis as possible causes for the patient's symptoms. A. DUODENUM,  BIOPSY:  - Duodenal mucosa with no specific histopathologic changes  - Negative for increased intraepithelial lymphocytes or villous  architectural changes   B. STOMACH, ANTRUM, BIOPSY:  - Gastric antral mucosa with moderate nonspecific reactive gastropathy  - Warthin Starry stain is negative for Helicobacter pylori   Prior to Admission medications   Medication Sig Start Date End Date Taking? Authorizing Provider  acetaminophen (TYLENOL) 325 MG tablet Take 650 mg by mouth 2 (two) times daily.   Yes [provider]  amLODipine (NORVASC) 10 MG tablet TAKE 1 TABLET BY MOUTH EVERY DAY 10/10/20  Yes Imogene Burn, PA-C  aspirin 81 MG chewable tablet Chew 1 tablet (81 mg total) by mouth daily. 11/13/18  Yes Dessa Phi, DO  carvedilol (COREG) 6.25 MG tablet Take 6.25 mg by mouth 2 (two) times daily with a meal.   Yes [provider]  Cholecalciferol 1.25 MG (50000 UT) capsule Take 50,000 Units by mouth every Sunday.   Yes [provider]  cinacalcet (SENSIPAR) 30 MG tablet Take 1 tablet (30 mg total) by mouth 2 (two) times daily with a meal. 06/18/21  Yes Madalyn Rob, MD  cyclobenzaprine (FLEXERIL) 5 MG tablet Take 5 mg by mouth at bedtime.   Yes [provider]  docusate sodium (COLACE) 100 MG capsule Take 100 mg by mouth 2 (two) times daily.   Yes [provider]  escitalopram (LEXAPRO) 20 MG tablet Take 20 mg by mouth daily. 08/11/20  Yes [provider]  famotidine (PEPCID) 20 MG tablet Take 1 tablet (20 mg total) by mouth daily. 06/18/21  Yes Court Joy,  Jeneen Rinks, MD  gabapentin (NEURONTIN) 100 MG capsule Take 100 mg by mouth 3 (three) times daily. 08/11/20  Yes [provider]  lisinopril (ZESTRIL) 10 MG tablet Take 1 tablet (10 mg total) by mouth daily. 06/25/21 09/23/21 Yes Imogene Burn, PA-C  Multiple Vitamin (MULTIVITAMIN WITH MINERALS) TABS tablet Take 1 tablet by mouth daily. 09/30/20  Yes Hongalgi, Lenis Dickinson, MD  ondansetron  (ZOFRAN) 4 MG tablet Take 1 tablet (4 mg total) by mouth every 6 (six) hours. 10/28/20  Yes Couture, Cortni S, PA-C  oxybutynin (DITROPAN-XL) 5 MG 24 hr tablet Take 5 mg by mouth daily.   Yes [provider]  rosuvastatin (CRESTOR) 5 MG tablet Take one tablet daily Patient taking differently: Take 5 mg by mouth daily. 10/10/20  Yes Imogene Burn, PA-C  traZODone (DESYREL) 50 MG tablet Take 50 mg by mouth at bedtime.   Yes [provider]        [provider]       [provider]  Nutritional Supplements (,FEEDING SUPPLEMENT, PROSOURCE PLUS) liquid Take 30 mLs by mouth daily. 09/29/20   Hongalgi, Lenis Dickinson, MD        Madalyn Rob, MD    Current Facility-Administered Medications  Medication Dose Route Frequency Provider Last Rate Last Admin   0.9 % NaCl with KCl 20 mEq/ L  infusion   Intravenous Continuous Emokpae, Ejiroghene E, MD 100 mL/hr at 08/02/21 0833 New Bag at 08/02/21 0865   acetaminophen (TYLENOL) tablet 650 mg  650 mg Oral Q6H PRN Emokpae, Ejiroghene E, MD       Or   acetaminophen (TYLENOL) suppository 650 mg  650 mg Rectal Q6H PRN Emokpae, Ejiroghene E, MD       bisacodyl (DULCOLAX) suppository 10 mg  10 mg Rectal Q12H Emokpae, Ejiroghene E, MD       Chlorhexidine Gluconate Cloth 2 % PADS 6 each  6 each Topical Daily Emokpae, Ejiroghene E, MD   6 each at 08/02/21 0831   hydrALAZINE (APRESOLINE) injection 10 mg  10 mg Intravenous Q4H PRN Emokpae, Ejiroghene E, MD   10 mg at 08/02/21 0727   insulin aspart (novoLOG) injection 0-9 Units  0-9 Units Subcutaneous Q6H Emokpae, Ejiroghene E, MD       morphine 2 MG/ML injection 2 mg  2 mg Intravenous Q4H PRN Emokpae, Ejiroghene E, MD   2 mg at 08/02/21 0727   ondansetron (ZOFRAN) tablet 4 mg  4 mg Oral Q6H PRN Emokpae, Ejiroghene E, MD       Or   ondansetron (ZOFRAN) injection 4 mg  4 mg Intravenous Q6H PRN Emokpae, Ejiroghene E, MD   4 mg at 08/02/21 0726   pantoprazole (PROTONIX) injection 40 mg  40  mg Intravenous Q12H Emokpae, Ejiroghene E, MD   40 mg at 08/02/21 0831    Allergies as of 08/01/2021 - Review Complete 08/01/2021  Allergen Reaction Noted   Contrast media [iodinated diagnostic agents] Swelling 04/13/2020   Oxycodone-acetaminophen Anaphylaxis 10/31/2018   Xarelto  [rivaroxaban] Swelling 04/13/2020   Oxycodone Other (See Comments) 04/13/2020    Past Medical History:  Diagnosis Date   CKD (chronic kidney disease) stage 3, GFR 30-59 ml/min ([place]) 10/31/2018   - per patient report   Coronary artery disease    per patient report - 2 caths with non-obstructive CAD   Essential hypertension    Frequent falls    H/O cardiac pacemaker 10/31/2018   implanted 05/2010   Sick sinus syndrome ([place])  Type 2 diabetes mellitus with complication, with long-term current use of insulin ([place]) 10/31/2018   with diabetic nephropathy    Past Surgical History:  Procedure Laterality Date   BIOPSY  09/28/2020   Procedure: BIOPSY;  Surgeon: Ronald Lobo, MD;  Location: [facility];  Service: Endoscopy;;   CARDIAC PACEMAKER PLACEMENT  2008   in [place] for sick sinus syndrome   CHOLECYSTECTOMY     CORONARY/GRAFT ACUTE MI REVASCULARIZATION N/A 11/09/2020   Procedure: CORONARY/GRAFT ACUTE MI REVASCULARIZATION;  Surgeon: Leonie Man, MD;  Location: [facility];  Service: Cardiovascular;  Laterality: N/A;   ESOPHAGOGASTRODUODENOSCOPY N/A 09/28/2020   Procedure: ESOPHAGOGASTRODUODENOSCOPY (EGD);  Surgeon: Ronald Lobo, MD;  Location: [facility];  Service: Endoscopy;  Laterality: N/A;   ESOPHAGOGASTRODUODENOSCOPY (EGD) WITH PROPOFOL N/A 11/10/2018   Procedure: ESOPHAGOGASTRODUODENOSCOPY (EGD) WITH PROPOFOL;  Surgeon: Wilford Corner, MD;  Location: [facility];  Service: Endoscopy;  Laterality: N/A;   LEFT HEART CATH AND CORONARY ANGIOGRAPHY N/A 10/31/2018   Procedure: LEFT HEART CATH AND CORONARY ANGIOGRAPHY;  Surgeon: Leonie Man, MD;  Location: [facility];  Service: Cardiovascular;  Laterality: N/A;   PACEMAKER GENERATOR CHANGE  09/2018   in [place]   REPLACEMENT TOTAL KNEE BILATERAL      Family History  Problem Relation Age of Onset   Breast cancer Mother    Bone cancer Mother    Heart attack Father 39   Breast cancer Sister    Heart disease Brother        stents   Other Sister        MVA   Diabetes Brother     Social History   Socioeconomic History   Marital status: Single    Spouse name: Not on file   Number of children: 4   Years of education: Not on file   Highest education level: Some college, no degree  Occupational History    Comment: retired  Tobacco Use   Smoking status: Former    Years: 3.00    Types: Cigarettes    Quit date: 12/09/1969    Years since quitting: 51.6   Smokeless tobacco: Never  Vaping Use   Vaping Use: Never used  Substance and Sexual Activity   Alcohol use: Not Currently   Drug use: Not Currently    Comment: as teenager   Sexual activity: Not on file  Other Topics Concern   Not on file  Social History Narrative   04/16/21 Moved from [place] and lives with daughter, [name] in [place].   Now in [employer]   Social Determinants of Health   Financial Resource Strain: Not on file  Food Insecurity: Not on file  Transportation Needs: Not on file  Physical Activity: Not on file  Stress: Not on file  Social Connections: Not on file  Intimate Partner Violence: Not on file     ROS: Question reliability as outlined above.  General: Negative for anorexia, fever, chills, fatigue, weakness.  See HPI Eyes: Negative for vision changes.  ENT: Negative for hoarseness, difficulty swallowing , nasal congestion. CV: Negative for chest pain, angina, palpitations, dyspnea on exertion, peripheral edema.  Respiratory: Negative for dyspnea at rest, dyspnea on exertion, cough, sputum, wheezing.  GI: See history of present illness. GU:  Negative for dysuria, hematuria, urinary incontinence, urinary  frequency, nocturnal urination.  MS: Negative for joint pain, low back pain.  Derm: Negative for rash or itching.  Neuro: Negative for weakness, abnormal sensation, seizure, frequent  headaches, memory loss, confusion.  Psych: Negative for anxiety, depression, suicidal ideation, hallucinations.  Endo: See HPI Heme: Negative for bruising or bleeding. Allergy: Negative for rash or hives.       Physical Examination: Vital signs in last 24 hours: Temp:  [97.8 F (36.6 C)-98.4 F (36.9 C)] 98.2 F (36.8 C) (08/25 0727) Pulse Rate:  [65-91] 87 (08/25 0530) Resp:  [12-22] 16 (08/25 0530) BP: (87-209)/(57-112) 209/103 (08/25 0727) SpO2:  [95 %-99 %] 96 % (08/25 0530) Weight:  [58.2 kg-65.3 kg] 58.2 kg (08/24 2242)    General: Chronically ill-appearing thin elderly female resting comfortably.  Drowsy but does arouse with persistent verbal stimuli.  She reports living with her daughter [name].  She is not sure where she is.  Details of history are lacking.  Follows simple commands. Head: Normocephalic, atraumatic.   Eyes: Conjunctiva pink, no icterus. Mouth: Oropharyngeal mucosa dry. Neck: Supple without thyromegaly, masses, or lymphadenopathy.  Lungs: Clear to auscultation bilaterally.  Heart: Regular rate and rhythm, no murmurs rubs or gallops.  Abdomen: Bowel sounds are normal, nondistended, no hepatosplenomegaly or masses, no abdominal bruits or    hernia , no rebound or guarding.  Mild to moderate epigastric tenderness, mild left lower quadrant tenderness Rectal: Not performed Extremities: No lower extremity edema, clubbing, deformity.  Neuro: Drowsy but easily arousable.  Skin: Warm and dry, no rash or jaundice.   Psych: Alert and cooperative.        Intake/Output from previous day: 08/24 0701 - 08/25 0700 In: 1250 [IV Piggyback:1250] Out: -  Intake/Output this shift: No intake/output data recorded.  Lab Results: CBC Recent Labs    08/01/21 1254 08/01/21 1304 08/01/21 1929  08/02/21 0500  WBC 27.9*  --  23.7* 20.6*  HGB 16.0* 16.0* 13.5 13.7  HCT 46.5* 47.0* 40.0 40.4  MCV 93.0  --  93.7 94.6  PLT 227  --  169 170   BMET Recent Labs    08/01/21 1254 08/01/21 1304 08/02/21 0500  NA 136 139 141  K 2.7* 2.8* 3.0*  CL 99 103 109  CO2 26  --  24  GLUCOSE 147* 148* 129*  BUN 61* 55* 49*  CREATININE 2.46* 2.50* 2.04*  CALCIUM 12.0*  --  11.1*   LFT Recent Labs    08/01/21 1254  BILITOT 1.2  ALKPHOS 85  AST 25  ALT 24  PROT 8.6*  ALBUMIN 4.6    Lipase No results for input(s): LIPASE in the last 72 hours.  PT/INR No results for input(s): LABPROT, INR in the last 72 hours.    Imaging Studies: CT ABDOMEN PELVIS WO CONTRAST  Result Date: 08/01/2021 CLINICAL DATA:  Abdominal pain, nausea and vomiting for 3 days EXAM: CT ABDOMEN AND PELVIS WITHOUT CONTRAST TECHNIQUE: Multidetector CT imaging of the abdomen and pelvis was performed following the standard protocol without IV contrast. COMPARISON:  07/31/2021, 06/13/2021, 11/09/2020 FINDINGS: Lower chest: No acute pleural or parenchymal lung disease. Stable pacemaker. Unenhanced CT was performed per clinician order. Lack of IV contrast limits sensitivity and specificity, especially for evaluation of abdominal/pelvic solid viscera. Hepatobiliary: No focal liver abnormality is seen. Status post cholecystectomy. Stable postsurgical dilatation of the common bile duct. Pancreas: Unremarkable. No pancreatic ductal dilatation or surrounding inflammatory changes. Spleen: Normal in size without focal abnormality. Adrenals/Urinary Tract: Innumerable bilateral renal cortical cysts are identified, some displaying thin peripheral calcifications. Evaluation is incomplete on unenhanced exam, but there is no gross change since the preceding study. No urinary tract calculi or  obstructive uropathy. The adrenals and bladder are stable. Stomach/Bowel: No bowel obstruction or ileus. Moderate retained stool throughout the colon.  The appendix, if still present, is not well visualized. No bowel wall thickening or inflammatory change. Vascular/Lymphatic: Aortic atherosclerosis. No enlarged abdominal or pelvic lymph nodes. Reproductive: Status post hysterectomy. No adnexal masses. Other: No free fluid or free gas.  No abdominal wall hernia. Musculoskeletal: There are no acute or destructive bony lesions. Stable degenerative changes of the lumbar spine and bilateral hips. Reconstructed images demonstrate no additional findings. IMPRESSION: 1. No acute intra-abdominal or intrapelvic process. 2. Stable mildly complex bilateral renal cysts, incompletely evaluated by unenhanced exam. 3. No bowel obstruction or ileus. Moderate residual stool throughout the colon. 4.  Aortic Atherosclerosis (ICD10-I70.0). Electronically Signed   By: Randa Ngo M.D.   On: 08/01/2021 17:01   DG Abd 1 View  Result Date: 08/01/2021 CLINICAL DATA:  Abdominal pain, nausea, constipation EXAM: ABDOMEN - 1 VIEW COMPARISON:  KUB 06/14/2021 FINDINGS: There is a nonobstructive bowel gas pattern. There is a moderate colonic stool burden projecting over the rectum and right hemiabdomen. There is no gross organomegaly or abnormal soft tissue calcification. There is no acute osseous abnormality. IMPRESSION: Moderate colonic stool burden as above without evidence of mechanical obstruction. Electronically Signed   By: Valetta Mole M.D.   On: 08/01/2021 13:39   DG Chest Port 1 View  Result Date: 08/01/2021 CLINICAL DATA:  Weakness.  Abdominal pain.  Nausea and vomiting. EXAM: PORTABLE CHEST 1 VIEW COMPARISON:  CT chest 06/13/2021 FINDINGS: Heart size is normal. Lungs are clear. No focal nodule, mass, or airspace disease is present. No edema or effusion is present. Pacemaker wires are stable. IMPRESSION: No acute cardiopulmonary disease or significant interval change. Electronically Signed   By: San Morelle M.D.   On: 08/01/2021 15:08   CUP PACEART INCLINIC DEVICE  CHECK  Result Date: 07/03/2021 Pacemaker check in clinic. Normal device function. Thresholds, sensing, impedances consistent with previous measurements. Device programmed to maximize longevity. No mode switch or high ventricular rates noted. Device programmed at appropriate safety margins. Histogram distribution appropriate for patient activity level. Estimated longevity 7 yr, 4 mo. Patient enrolled in remote follow-ups. Patient education completed.  CUP PACEART REMOTE DEVICE CHECK  Result Date: 07/12/2021 Scheduled remote reviewed. Normal device function.  Next remote in 91 days. Kathy Breach, RN, CCDS, [employer]  ECHOCARDIOGRAM LIMITED  Result Date: 08/01/2021    ECHOCARDIOGRAM LIMITED REPORT   Patient Name:   Melissa Lopez Date of Exam: 08/01/2021 Medical Rec #:  [id number]        Height:       64.0 in Accession #:    [id number]       Weight:       144.0 lb Date of Birth:  12-25-1950        BSA:          1.701 m Patient Age:    61 years         BP:           188/104 mmHg Patient Gender: F                HR:           68 bpm. Exam Location:  [facility] Procedure: 2D Echo, Cardiac Doppler and Color Doppler Indications:    Abnormal ECG R94.31  History:        Patient has prior history of Echocardiogram examinations, most  recent 06/13/2021. Hypertrophic Cardiomyopathy, CAD and STEMI (ST                 elevation myocardial infarction), Pacemaker; Risk                 Factors:Hypertension and Diabetes. Sick sinus syndrome (HCC)                 (From Hx).  Sonographer:    Alvino Chapel RCS Referring Phys: [id number] [place]  1. There is moderate asymmsetric septal hypertrophy. The apex is not completely visualized but appears to have severe apical hypertrophy with systolic apical obliteration but no significant intracavitary gradient. Findings consistent with apical hypertrophic cardiomyopathy. . Left ventricular ejection fraction, by estimation, is 65 to 70%. The  left ventricle has normal function. The left ventricle has no regional wall motion abnormalities. Left ventricular diastolic parameters are consistent with Grade I diastolic dysfunction (impaired relaxation).  2. Right ventricular systolic function is normal. The right ventricular size is normal.  3. Limited study to evaluate LV function FINDINGS  Left Ventricle: There is moderate asymmsetric septal hypertrophy. The apex is not completely visualized but appears to have severe apical hypertrophy with systolic apical obliteration but no significant intracavitary gradient. Findings consistent with apical hypertrophic cardiomyopathy. Left ventricular ejection fraction, by estimation, is 65 to 70%. The left ventricle has normal function. The left ventricle has no regional wall motion abnormalities. Left ventricular diastolic parameters are consistent with Grade I diastolic dysfunction (impaired relaxation). Right Ventricle: The right ventricular size is normal. No increase in right ventricular wall thickness. Right ventricular systolic function is normal. LEFT VENTRICLE PLAX 2D LVIDd:         4.07 cm  Diastology LVIDs:         2.71 cm  LV e' lateral:   4.82 cm/s LV PW:         1.09 cm  LV E/e' lateral: 10.6 LV IVS:        1.46 cm LVOT diam:     2.30 cm LV SV:         86 LV SV Index:   51 LVOT Area:     4.15 cm  RIGHT VENTRICLE RV S prime:     12.90 cm/s TAPSE (M-mode): 1.5 cm LEFT ATRIUM           Index       RIGHT ATRIUM           Index LA diam:      3.20 cm 1.88 cm/m  RA Area:     11.50 cm LA Vol (A4C): 29.9 ml 17.58 ml/m RA Volume:   26.20 ml  15.40 ml/m  AORTIC VALVE LVOT Vmax:   120.00 cm/s LVOT Vmean:  76.100 cm/s LVOT VTI:    0.208 m  AORTA Ao Root diam: 3.60 cm MITRAL VALVE MV Area (PHT): 3.15 cm     SHUNTS MV Decel Time: 241 msec     Systemic VTI:  0.21 m MV E velocity: 51.00 cm/s   Systemic Diam: 2.30 cm MV A velocity: 115.00 cm/s MV E/A ratio:  0.44 Carlyle Dolly MD Electronically signed by Carlyle Dolly MD Signature Date/Time: 08/01/2021/3:51:22 PM    Final   Minnie.[place] week]   Impression: Pleasant 70 year old female with past medical history significant for diabetes with nephropathy, CKD 3, diastolic CHF, SSS status post pacemaker, hypertension, hypertrophic cardiomyopathy presenting via EMS with complaints of 3-day history of nausea/vomiting/abdominal pain.  Was contacted yesterday by  [facility] due to [facility] alert for high ventricular rate.  Advised to go to the ED.  GI consulted for hematemesis.  Nausea/vomiting/hematemesis: Acute on chronic nausea and vomiting.  Historically has had intermittent vomiting for decades.  Per daughter, occurred even prior to her diabetes and in the past seem to be aggravated with stress or moves.  Daughter believes exacerbated by constipation.  Symptoms seem to be worse since stopping Reglan after last hospitalization.  Presented this admission with 3-day history of abdominal pain, vomiting.  Yesterday hematemesis noted as outlined above.  No reported NSAID use.  She is on aspirin daily.  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  Hemoglobin on presentation 16 in the setting of dehydration.  Hemoglobin today 13.7.  BUN 61 initially down to 49 today.  White count initially 27,900 down to 20,600 today.  CT performed due to abdominal pain, noncontrast study.  Moderate residual stool noted throughout the colon, stable chronic CBD dilation.  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  She has had EGD in 2019 and 2021 for nausea and vomiting with findings as outlined.  She had been on metoclopramide but this was stopped in July at time of discharge.  Previously presumed gastroparesis although she was never able to tolerate gastric emptying study while inpatient due to vomiting.  Possibly exacerbated by constipation although I do not feel like this is  the only factor.  Patient has massive weight loss as outlined.  Per daughter, patient seems to have a fear of eating due to vomiting and/or abdominal pain.  Multiple CT scans without contrast.  History of common bile duct to 17 mm on CT July 2022, has been stable and felt to be postsurgical.  LFTs have been normal.  Difficult to assess for mesenteric ischemia without contrast.  Consider adrenal insufficiency as well.  Plan: Replete potassium Follow H&H, monitor for further bleeding. Continue pantoprazole 40 mg IV every 12 hours. Linzess 72 mcg daily for constipation. Supportive measures. May benefit from outpatient gastric emptying study when she is not acutely ill. Fasting a.m. cortisol. To discuss possible upper endoscopy during this admission with Dr. Gala Romney. F/u pending A1C  We would like to thank you for the opportunity to participate in the care of [facility].    LOS: 1 day

## 2021-08-02 NOTE — H&P (View-Only) (Signed)
Referring Provider: Orson Eva, MD Primary Care Physician:  [facility] [place] Primary Gastroenterologist: Has been evaluated during admissions by both [facility]  Reason for Consultation:  Hematemesis  HPI: Melissa Lopez is a 70 y.o. female with past medical history significant for diabetes with nephropathy, CAD, CKD 3, diastolic CHF, SSS status post pacemaker, hypertension, hypercalcemia, primary hyperparathyroidism, hypertrophic cardiomyopathy presenting via EMS with complaints of 3-day history of nausea/vomiting/abdominal pain.    Patient is a difficult historian. Initially she was very drowsy but became alert with verbal stimuli. Question reliability of history. I spoke to daughter, Melissa Lopez, by phone and was able to obtain history from her. Patient has long standing history of intermittent vomiting as long as she can remember. Episodes would occur off/on, requiring hospitalization several times. Seemed to be aggravated by stressors.  Patient moved to the area couple of years ago with her daughter.  With this moved she developed increased episodes of nausea and vomiting which has been more refractory to treatment.  Initial hospitalization for nausea and vomiting since she moved here was in 2019 as outlined below.  Daughter believes episodes seem to be exacerbated by either dehydration or with constipation.  She has had significant weight loss over the past 2 years, patient reports "I used to weight 200 and something".  Her records available in November 2019 she weighed 189 pounds, September of last year she weighed 166 pounds, today she weighs 128 pounds.  Daughter has been pushing fluids, Ensure, frequent small meals.  Sometimes patient just does not want to eat because she is fearful of abdominal pain and vomiting.   This episode started Friday morning.  She had a couple episodes of vomiting, was able to get under control with Zofran.  Another episode Saturday  morning.  Sunday she had increased nausea and vomiting and more difficulty keeping fluids down.  Monday she was seen by provider with [facility], daughter felt like she needed admission for dehydration but she was sent home.  Tuesday morning she vomited 1/2 cups of dark congealed blood. Vomitus with frank blood, contained clots, large amounts per admitting attending who reviewed picture provided by daughter.  No blood in the stool or melena.  Denied NSAID use.  Patient also complaining of upper abdominal pain, has been in 2 years per daughter.  She also complained to me of left lower quadrant pain, daughter reports possibly related to her chronic left hip pain.  She also has chronic back pain and has been complaining more of these symptoms lately.  For constipation they have been using Senokot daily, MiraLAX daily without relief.  Never on prescription medications.  Remote colonoscopy 6 or 7 years ago was unremarkable per daughter.  Patient reports a lot of heartburn, dysphagia.  She takes Pepcid twice daily but no PPI.  Daughter notes that her Reglan was stopped back in July at time of discharge, and she is seem to have increased issues since that time.  Historically has used Reglan off and on for months at a time.  Patient previously on metoclopramide but this was discontinued at time of discharge last month when she presented with convulsive syncope, severe hypercalcemia.  In the ED: White blood cell count 27,900, hemoglobin 16, platelets 227,000, potassium 2.7, creatinine 2.46 (baseline between 1.5 and 1.8), BUN 61, troponin I 140--> 109, LFTs normal, magnesium 2.4, SARS coronavirus 2 negative, heme positive stool.  Cardiology contacted regarding EKG with ST elevations, elevated troponin I.  Noted to  have stable ST elevations dating back to 2019 with previous catheterization showing no significant disease.  It was felt that apical hypertrophy may explain EKG findings that elevated troponin likely demand ischemia  in the setting of nausea/vomiting, AKI, leukocytosis.  Trending troponins.  Limited echo showed hyperdynamic LV with apical hypertrophy.  No plans for additional cardiac testing at this time per Dr. Harl Bowie.  Today: Blood cell count 20,600, hemoglobin 13.7, potassium 3, BUN 49, creatinine 2.04.  Abdominal x-ray: IMPRESSION: Moderate colonic stool burden as above without evidence of mechanical obstruction.  CT abdomen pelvis without contrast yesterday IMPRESSION: 1. No acute intra-abdominal or intrapelvic process. 2. Stable mildly complex bilateral renal cysts, incompletely evaluated by unenhanced exam. 3. No bowel obstruction or ileus. Moderate residual stool throughout the colon. 4.  Aortic Atherosclerosis (ICD10-I70.0). 5. Stable postsurgical dilatation of the CBD.  Pertinent previous work up: Hospitalized December 2021 with nausea/vomiting/diarrhea.  History of chronic nausea and vomiting felt to be secondary to gastroparesis plus or minus THC use.  During that hospitalization she reported an involuntary 100 pound weight loss over 2-year period of time approximately.  She did not tolerate a gastric emptying study due to vomiting.  Noncontrast CT scans were unremarkable.  History of stable biliary duct dilation with normal LFTs.  History of mild pancreatic atrophy on CT. Also hospitalized in 11/2018 with N/V.   EGD 11/2018: For nausea/vomiting (inpatient, [facility]) - Normal esophagus. - Z-line regular, 40 cm from the incisors. - Acute gastritis. - Small hiatal hernia. - Mucosal changes in the duodenum. - No specimens collected-  EGD 09/2020: EGD for nausea/vomiting (inpatient, [facility]) - Normal larynx. - Normal esophagus. - Bilious gastric fluid. - Erythematous mucosa in the antrum. Biopsied. - Normal examined duodenum. Biopsied. - The presence of bile reflux suggests gastric dysmotility or bile reflux gastritis as possible causes for the patient's symptoms. A. DUODENUM,  BIOPSY:  - Duodenal mucosa with no specific histopathologic changes  - Negative for increased intraepithelial lymphocytes or villous  architectural changes   B. STOMACH, ANTRUM, BIOPSY:  - Gastric antral mucosa with moderate nonspecific reactive gastropathy  - Warthin Starry stain is negative for Helicobacter pylori   Prior to Admission medications   Medication Sig Start Date End Date Taking? Authorizing Provider  acetaminophen (TYLENOL) 325 MG tablet Take 650 mg by mouth 2 (two) times daily.   Yes [provider]  amLODipine (NORVASC) 10 MG tablet TAKE 1 TABLET BY MOUTH EVERY DAY 10/10/20  Yes Imogene Burn, PA-C  aspirin 81 MG chewable tablet Chew 1 tablet (81 mg total) by mouth daily. 11/13/18  Yes Dessa Phi, DO  carvedilol (COREG) 6.25 MG tablet Take 6.25 mg by mouth 2 (two) times daily with a meal.   Yes [provider]  Cholecalciferol 1.25 MG (50000 UT) capsule Take 50,000 Units by mouth every Sunday.   Yes [provider]  cinacalcet (SENSIPAR) 30 MG tablet Take 1 tablet (30 mg total) by mouth 2 (two) times daily with a meal. 06/18/21  Yes Madalyn Rob, MD  cyclobenzaprine (FLEXERIL) 5 MG tablet Take 5 mg by mouth at bedtime.   Yes [provider]  docusate sodium (COLACE) 100 MG capsule Take 100 mg by mouth 2 (two) times daily.   Yes [provider]  escitalopram (LEXAPRO) 20 MG tablet Take 20 mg by mouth daily. 08/11/20  Yes [provider]  famotidine (PEPCID) 20 MG tablet Take 1 tablet (20 mg total) by mouth daily. 06/18/21  Yes Court Joy,  Jeneen Rinks, MD  gabapentin (NEURONTIN) 100 MG capsule Take 100 mg by mouth 3 (three) times daily. 08/11/20  Yes [provider]  lisinopril (ZESTRIL) 10 MG tablet Take 1 tablet (10 mg total) by mouth daily. 06/25/21 09/23/21 Yes Imogene Burn, PA-C  Multiple Vitamin (MULTIVITAMIN WITH MINERALS) TABS tablet Take 1 tablet by mouth daily. 09/30/20  Yes Hongalgi, Lenis Dickinson, MD  ondansetron  (ZOFRAN) 4 MG tablet Take 1 tablet (4 mg total) by mouth every 6 (six) hours. 10/28/20  Yes Couture, Cortni S, PA-C  oxybutynin (DITROPAN-XL) 5 MG 24 hr tablet Take 5 mg by mouth daily.   Yes [provider]  rosuvastatin (CRESTOR) 5 MG tablet Take one tablet daily Patient taking differently: Take 5 mg by mouth daily. 10/10/20  Yes Imogene Burn, PA-C  traZODone (DESYREL) 50 MG tablet Take 50 mg by mouth at bedtime.   Yes [provider]        [provider]       [provider]  Nutritional Supplements (,FEEDING SUPPLEMENT, PROSOURCE PLUS) liquid Take 30 mLs by mouth daily. 09/29/20   Hongalgi, Lenis Dickinson, MD        Madalyn Rob, MD    Current Facility-Administered Medications  Medication Dose Route Frequency Provider Last Rate Last Admin   0.9 % NaCl with KCl 20 mEq/ L  infusion   Intravenous Continuous Emokpae, Ejiroghene E, MD 100 mL/hr at 08/02/21 0833 New Bag at 08/02/21 6301   acetaminophen (TYLENOL) tablet 650 mg  650 mg Oral Q6H PRN Emokpae, Ejiroghene E, MD       Or   acetaminophen (TYLENOL) suppository 650 mg  650 mg Rectal Q6H PRN Emokpae, Ejiroghene E, MD       bisacodyl (DULCOLAX) suppository 10 mg  10 mg Rectal Q12H Emokpae, Ejiroghene E, MD       Chlorhexidine Gluconate Cloth 2 % PADS 6 each  6 each Topical Daily Emokpae, Ejiroghene E, MD   6 each at 08/02/21 0831   hydrALAZINE (APRESOLINE) injection 10 mg  10 mg Intravenous Q4H PRN Emokpae, Ejiroghene E, MD   10 mg at 08/02/21 0727   insulin aspart (novoLOG) injection 0-9 Units  0-9 Units Subcutaneous Q6H Emokpae, Ejiroghene E, MD       morphine 2 MG/ML injection 2 mg  2 mg Intravenous Q4H PRN Emokpae, Ejiroghene E, MD   2 mg at 08/02/21 0727   ondansetron (ZOFRAN) tablet 4 mg  4 mg Oral Q6H PRN Emokpae, Ejiroghene E, MD       Or   ondansetron (ZOFRAN) injection 4 mg  4 mg Intravenous Q6H PRN Emokpae, Ejiroghene E, MD   4 mg at 08/02/21 0726   pantoprazole (PROTONIX) injection 40 mg  40  mg Intravenous Q12H Emokpae, Ejiroghene E, MD   40 mg at 08/02/21 0831    Allergies as of 08/01/2021 - Review Complete 08/01/2021  Allergen Reaction Noted   Contrast media [iodinated diagnostic agents] Swelling 04/13/2020   Oxycodone-acetaminophen Anaphylaxis 10/31/2018   Xarelto  [rivaroxaban] Swelling 04/13/2020   Oxycodone Other (See Comments) 04/13/2020    Past Medical History:  Diagnosis Date   CKD (chronic kidney disease) stage 3, GFR 30-59 ml/min ([place]) 10/31/2018   - per patient report   Coronary artery disease    per patient report - 2 caths with non-obstructive CAD   Essential hypertension    Frequent falls    H/O cardiac pacemaker 10/31/2018   implanted 05/2010   Sick sinus syndrome ([place])  Type 2 diabetes mellitus with complication, with long-term current use of insulin ([place]) 10/31/2018   with diabetic nephropathy    Past Surgical History:  Procedure Laterality Date   BIOPSY  09/28/2020   Procedure: BIOPSY;  Surgeon: Ronald Lobo, MD;  Location: [facility];  Service: Endoscopy;;   CARDIAC PACEMAKER PLACEMENT  2008   in [place] for sick sinus syndrome   CHOLECYSTECTOMY     CORONARY/GRAFT ACUTE MI REVASCULARIZATION N/A 11/09/2020   Procedure: CORONARY/GRAFT ACUTE MI REVASCULARIZATION;  Surgeon: Leonie Man, MD;  Location: [facility];  Service: Cardiovascular;  Laterality: N/A;   ESOPHAGOGASTRODUODENOSCOPY N/A 09/28/2020   Procedure: ESOPHAGOGASTRODUODENOSCOPY (EGD);  Surgeon: Ronald Lobo, MD;  Location: [facility];  Service: Endoscopy;  Laterality: N/A;   ESOPHAGOGASTRODUODENOSCOPY (EGD) WITH PROPOFOL N/A 11/10/2018   Procedure: ESOPHAGOGASTRODUODENOSCOPY (EGD) WITH PROPOFOL;  Surgeon: Wilford Corner, MD;  Location: [facility];  Service: Endoscopy;  Laterality: N/A;   LEFT HEART CATH AND CORONARY ANGIOGRAPHY N/A 10/31/2018   Procedure: LEFT HEART CATH AND CORONARY ANGIOGRAPHY;  Surgeon: Leonie Man, MD;  Location: [facility];  Service: Cardiovascular;  Laterality: N/A;   PACEMAKER GENERATOR CHANGE  09/2018   in [place]   REPLACEMENT TOTAL KNEE BILATERAL      Family History  Problem Relation Age of Onset   Breast cancer Mother    Bone cancer Mother    Heart attack Father 93   Breast cancer Sister    Heart disease Brother        stents   Other Sister        MVA   Diabetes Brother     Social History   Socioeconomic History   Marital status: Single    Spouse name: Not on file   Number of children: 4   Years of education: Not on file   Highest education level: Some college, no degree  Occupational History    Comment: retired  Tobacco Use   Smoking status: Former    Years: 3.00    Types: Cigarettes    Quit date: 12/09/1969    Years since quitting: 51.6   Smokeless tobacco: Never  Vaping Use   Vaping Use: Never used  Substance and Sexual Activity   Alcohol use: Not Currently   Drug use: Not Currently    Comment: as teenager   Sexual activity: Not on file  Other Topics Concern   Not on file  Social History Narrative   04/16/21 Moved from [place] and lives with daughter, Melissa Lopez in [place].   Now in [employer]   Social Determinants of Health   Financial Resource Strain: Not on file  Food Insecurity: Not on file  Transportation Needs: Not on file  Physical Activity: Not on file  Stress: Not on file  Social Connections: Not on file  Intimate Partner Violence: Not on file     ROS: Question reliability as outlined above.  General: Negative for anorexia, fever, chills, fatigue, weakness.  See HPI Eyes: Negative for vision changes.  ENT: Negative for hoarseness, difficulty swallowing , nasal congestion. CV: Negative for chest pain, angina, palpitations, dyspnea on exertion, peripheral edema.  Respiratory: Negative for dyspnea at rest, dyspnea on exertion, cough, sputum, wheezing.  GI: See history of present illness. GU:  Negative for dysuria, hematuria, urinary incontinence, urinary  frequency, nocturnal urination.  MS: Negative for joint pain, low back pain.  Derm: Negative for rash or itching.  Neuro: Negative for weakness, abnormal sensation, seizure, frequent  headaches, memory loss, confusion.  Psych: Negative for anxiety, depression, suicidal ideation, hallucinations.  Endo: See HPI Heme: Negative for bruising or bleeding. Allergy: Negative for rash or hives.       Physical Examination: Vital signs in last 24 hours: Temp:  [97.8 F (36.6 C)-98.4 F (36.9 C)] 98.2 F (36.8 C) (08/25 0727) Pulse Rate:  [65-91] 87 (08/25 0530) Resp:  [12-22] 16 (08/25 0530) BP: (87-209)/(57-112) 209/103 (08/25 0727) SpO2:  [95 %-99 %] 96 % (08/25 0530) Weight:  [58.2 kg-65.3 kg] 58.2 kg (08/24 2242)    General: Chronically ill-appearing thin elderly female resting comfortably.  Drowsy but does arouse with persistent verbal stimuli.  She reports living with her daughter Melissa Lopez.  She is not sure where she is.  Details of history are lacking.  Follows simple commands. Head: Normocephalic, atraumatic.   Eyes: Conjunctiva pink, no icterus. Mouth: Oropharyngeal mucosa dry. Neck: Supple without thyromegaly, masses, or lymphadenopathy.  Lungs: Clear to auscultation bilaterally.  Heart: Regular rate and rhythm, no murmurs rubs or gallops.  Abdomen: Bowel sounds are normal, nondistended, no hepatosplenomegaly or masses, no abdominal bruits or    hernia , no rebound or guarding.  Mild to moderate epigastric tenderness, mild left lower quadrant tenderness Rectal: Not performed Extremities: No lower extremity edema, clubbing, deformity.  Neuro: Drowsy but easily arousable.  Skin: Warm and dry, no rash or jaundice.   Psych: Alert and cooperative.        Intake/Output from previous day: 08/24 0701 - 08/25 0700 In: 1250 [IV Piggyback:1250] Out: -  Intake/Output this shift: No intake/output data recorded.  Lab Results: CBC Recent Labs    08/01/21 1254 08/01/21 1304 08/01/21 1929  08/02/21 0500  WBC 27.9*  --  23.7* 20.6*  HGB 16.0* 16.0* 13.5 13.7  HCT 46.5* 47.0* 40.0 40.4  MCV 93.0  --  93.7 94.6  PLT 227  --  169 170   BMET Recent Labs    08/01/21 1254 08/01/21 1304 08/02/21 0500  NA 136 139 141  K 2.7* 2.8* 3.0*  CL 99 103 109  CO2 26  --  24  GLUCOSE 147* 148* 129*  BUN 61* 55* 49*  CREATININE 2.46* 2.50* 2.04*  CALCIUM 12.0*  --  11.1*   LFT Recent Labs    08/01/21 1254  BILITOT 1.2  ALKPHOS 85  AST 25  ALT 24  PROT 8.6*  ALBUMIN 4.6    Lipase No results for input(s): LIPASE in the last 72 hours.  PT/INR No results for input(s): LABPROT, INR in the last 72 hours.    Imaging Studies: CT ABDOMEN PELVIS WO CONTRAST  Result Date: 08/01/2021 CLINICAL DATA:  Abdominal pain, nausea and vomiting for 3 days EXAM: CT ABDOMEN AND PELVIS WITHOUT CONTRAST TECHNIQUE: Multidetector CT imaging of the abdomen and pelvis was performed following the standard protocol without IV contrast. COMPARISON:  07/31/2021, 06/13/2021, 11/09/2020 FINDINGS: Lower chest: No acute pleural or parenchymal lung disease. Stable pacemaker. Unenhanced CT was performed per clinician order. Lack of IV contrast limits sensitivity and specificity, especially for evaluation of abdominal/pelvic solid viscera. Hepatobiliary: No focal liver abnormality is seen. Status post cholecystectomy. Stable postsurgical dilatation of the common bile duct. Pancreas: Unremarkable. No pancreatic ductal dilatation or surrounding inflammatory changes. Spleen: Normal in size without focal abnormality. Adrenals/Urinary Tract: Innumerable bilateral renal cortical cysts are identified, some displaying thin peripheral calcifications. Evaluation is incomplete on unenhanced exam, but there is no gross change since the preceding study. No urinary tract calculi or  obstructive uropathy. The adrenals and bladder are stable. Stomach/Bowel: No bowel obstruction or ileus. Moderate retained stool throughout the colon.  The appendix, if still present, is not well visualized. No bowel wall thickening or inflammatory change. Vascular/Lymphatic: Aortic atherosclerosis. No enlarged abdominal or pelvic lymph nodes. Reproductive: Status post hysterectomy. No adnexal masses. Other: No free fluid or free gas.  No abdominal wall hernia. Musculoskeletal: There are no acute or destructive bony lesions. Stable degenerative changes of the lumbar spine and bilateral hips. Reconstructed images demonstrate no additional findings. IMPRESSION: 1. No acute intra-abdominal or intrapelvic process. 2. Stable mildly complex bilateral renal cysts, incompletely evaluated by unenhanced exam. 3. No bowel obstruction or ileus. Moderate residual stool throughout the colon. 4.  Aortic Atherosclerosis (ICD10-I70.0). Electronically Signed   By: Randa Ngo M.D.   On: 08/01/2021 17:01   DG Abd 1 View  Result Date: 08/01/2021 CLINICAL DATA:  Abdominal pain, nausea, constipation EXAM: ABDOMEN - 1 VIEW COMPARISON:  KUB 06/14/2021 FINDINGS: There is a nonobstructive bowel gas pattern. There is a moderate colonic stool burden projecting over the rectum and right hemiabdomen. There is no gross organomegaly or abnormal soft tissue calcification. There is no acute osseous abnormality. IMPRESSION: Moderate colonic stool burden as above without evidence of mechanical obstruction. Electronically Signed   By: Valetta Mole M.D.   On: 08/01/2021 13:39   DG Chest Port 1 View  Result Date: 08/01/2021 CLINICAL DATA:  Weakness.  Abdominal pain.  Nausea and vomiting. EXAM: PORTABLE CHEST 1 VIEW COMPARISON:  CT chest 06/13/2021 FINDINGS: Heart size is normal. Lungs are clear. No focal nodule, mass, or airspace disease is present. No edema or effusion is present. Pacemaker wires are stable. IMPRESSION: No acute cardiopulmonary disease or significant interval change. Electronically Signed   By: San Morelle M.D.   On: 08/01/2021 15:08   CUP PACEART INCLINIC DEVICE  CHECK  Result Date: 07/03/2021 Pacemaker check in clinic. Normal device function. Thresholds, sensing, impedances consistent with previous measurements. Device programmed to maximize longevity. No mode switch or high ventricular rates noted. Device programmed at appropriate safety margins. Histogram distribution appropriate for patient activity level. Estimated longevity 7 yr, 4 mo. Patient enrolled in remote follow-ups. Patient education completed.  CUP PACEART REMOTE DEVICE CHECK  Result Date: 07/12/2021 Scheduled remote reviewed. Normal device function.  Next remote in 91 days. Kathy Breach, RN, CCDS, [employer]  ECHOCARDIOGRAM LIMITED  Result Date: 08/01/2021    ECHOCARDIOGRAM LIMITED REPORT   Patient Name:   CASI WESTERFELD Date of Exam: 08/01/2021 Medical Rec #:  [id number]        Height:       64.0 in Accession #:    [id number]       Weight:       144.0 lb Date of Birth:  [birth year]        BSA:          1.701 m Patient Age:    1 years         BP:           188/104 mmHg Patient Gender: F                HR:           68 bpm. Exam Location:  [facility] Procedure: 2D Echo, Cardiac Doppler and Color Doppler Indications:    Abnormal ECG R94.31  History:        Patient has prior history of Echocardiogram examinations, most  recent 06/13/2021. Hypertrophic Cardiomyopathy, CAD and STEMI (ST                 elevation myocardial infarction), Pacemaker; Risk                 Factors:Hypertension and Diabetes. Sick sinus syndrome ([facility])                 (From Hx).  Sonographer:    Alvino Chapel RCS Referring Phys: [id number] [place]  1. There is moderate asymmsetric septal hypertrophy. The apex is not completely visualized but appears to have severe apical hypertrophy with systolic apical obliteration but no significant intracavitary gradient. Findings consistent with apical hypertrophic cardiomyopathy. . Left ventricular ejection fraction, by estimation, is 65 to 70%. The  left ventricle has normal function. The left ventricle has no regional wall motion abnormalities. Left ventricular diastolic parameters are consistent with Grade I diastolic dysfunction (impaired relaxation).  2. Right ventricular systolic function is normal. The right ventricular size is normal.  3. Limited study to evaluate LV function FINDINGS  Left Ventricle: There is moderate asymmsetric septal hypertrophy. The apex is not completely visualized but appears to have severe apical hypertrophy with systolic apical obliteration but no significant intracavitary gradient. Findings consistent with apical hypertrophic cardiomyopathy. Left ventricular ejection fraction, by estimation, is 65 to 70%. The left ventricle has normal function. The left ventricle has no regional wall motion abnormalities. Left ventricular diastolic parameters are consistent with Grade I diastolic dysfunction (impaired relaxation). Right Ventricle: The right ventricular size is normal. No increase in right ventricular wall thickness. Right ventricular systolic function is normal. LEFT VENTRICLE PLAX 2D LVIDd:         4.07 cm  Diastology LVIDs:         2.71 cm  LV e' lateral:   4.82 cm/s LV PW:         1.09 cm  LV E/e' lateral: 10.6 LV IVS:        1.46 cm LVOT diam:     2.30 cm LV SV:         86 LV SV Index:   51 LVOT Area:     4.15 cm  RIGHT VENTRICLE RV S prime:     12.90 cm/s TAPSE (M-mode): 1.5 cm LEFT ATRIUM           Index       RIGHT ATRIUM           Index LA diam:      3.20 cm 1.88 cm/m  RA Area:     11.50 cm LA Vol (A4C): 29.9 ml 17.58 ml/m RA Volume:   26.20 ml  15.40 ml/m  AORTIC VALVE LVOT Vmax:   120.00 cm/s LVOT Vmean:  76.100 cm/s LVOT VTI:    0.208 m  AORTA Ao Root diam: 3.60 cm MITRAL VALVE MV Area (PHT): 3.15 cm     SHUNTS MV Decel Time: 241 msec     Systemic VTI:  0.21 m MV E velocity: 51.00 cm/s   Systemic Diam: 2.30 cm MV A velocity: 115.00 cm/s MV E/A ratio:  0.44 Carlyle Dolly MD Electronically signed by Carlyle Dolly MD Signature Date/Time: 08/01/2021/3:51:22 PM    Final   Minnie.[place] week]   Impression: Pleasant 70 year old female with past medical history significant for diabetes with nephropathy, CKD 3, diastolic CHF, SSS status post pacemaker, hypertension, hypertrophic cardiomyopathy presenting via EMS with complaints of 3-day history of nausea/vomiting/abdominal pain.  Was contacted yesterday by  [facility] due to [facility] alert for high ventricular rate.  Advised to go to the ED.  GI consulted for hematemesis.  Nausea/vomiting/hematemesis: Acute on chronic nausea and vomiting.  Historically has had intermittent vomiting for decades.  Per daughter, occurred even prior to her diabetes and in the past seem to be aggravated with stress or moves.  Daughter believes exacerbated by constipation.  Symptoms seem to be worse since stopping Reglan after last hospitalization.  Presented this admission with 3-day history of abdominal pain, vomiting.  Yesterday hematemesis noted as outlined above.  No reported NSAID use.  She is on aspirin daily.  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  Hemoglobin on presentation 16 in the setting of dehydration.  Hemoglobin today 13.7.  BUN 61 initially down to 49 today.  White count initially 27,900 down to 20,600 today.  CT performed due to abdominal pain, noncontrast study.  Moderate residual stool noted throughout the colon, stable chronic CBD dilation.  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  She has had EGD in 2019 and 2021 for nausea and vomiting with findings as outlined.  She had been on metoclopramide but this was stopped in July at time of discharge.  Previously presumed gastroparesis although she was never able to tolerate gastric emptying study while inpatient due to vomiting.  Possibly exacerbated by constipation although I do not feel like this is  the only factor.  Patient has massive weight loss as outlined.  Per daughter, patient seems to have a fear of eating due to vomiting and/or abdominal pain.  Multiple CT scans without contrast.  History of common bile duct to 17 mm on CT July 2022, has been stable and felt to be postsurgical.  LFTs have been normal.  Difficult to assess for mesenteric ischemia without contrast.  Consider adrenal insufficiency as well.  Plan: Replete potassium Follow H&H, monitor for further bleeding. Continue pantoprazole 40 mg IV every 12 hours. Linzess 72 mcg daily for constipation. Supportive measures. May benefit from outpatient gastric emptying study when she is not acutely ill. Fasting a.m. cortisol. To discuss possible upper endoscopy during this admission with Dr. Gala Romney. F/u pending A1C  We would like to thank you for the opportunity to participate in the care of [facility].    LOS: 1 day

## 2021-08-02 NOTE — Progress Notes (Signed)
PROGRESS NOTE  Melissa Lopez YKD:[id number] DOB: 1951/08/04 DOA: 08/01/2021 PCP: [facility] [place]  Brief History:  70 year old female with a history of CKD stage III, coronary artery disease, sick sinus syndrome status post permanent pacemaker, primary hyperparathyroidism, diabetes mellitus with gastroparesis, and hypertension presenting with nausea and vomiting that began on 07/27/2021.  The patient has had vomiting on a daily basis.  However the patient's daughter noted that she began having hematemesis on the morning of 08/01/2021.  She described it as a 1-1/2 cups of blood.  As result, the patient was brought to the hospital for further evaluation.  The patient has not had any fevers, chills, chest pain, shortness of breath, palpitations, syncope.  She has not had any diarrhea, hematochezia, melena.  In fact, the patient has trouble with constipation.  There is not been any new medications.  The patient has not had any recent travels or eaten any undercooked or exotic foods.  She has not had any sick contacts.  Patient has been complaining of intermittent abdominal pain.  She has not been taking any NSAIDs. In the emergency department, the patient was afebrile and hemodynamically stable with oxygen saturation 96/97% on room air.  CT of the abdomen and pelvis showed stable CBD dilatation.  There were innumerable bilateral renal cysts without hydronephrosis.  There is moderate stool in the colon.  WBC was 27.9 with hemoglobin 16.0 and platelets 170,000.  BMP showed serum creatinine 2.46.  The patient was started on IV fluids.  GI was consulted to assist with management.  Notably, the patient had a recent hospital admission from 06/12/2021 to 06/18/2021 at which time she was treated for hypercalcemia, AKI, and shaking spells.  Her hypercalcemia improved with IV fluids.  It was felt this was due to primary hyperparathyroidism.  She was seen by neurology and felt that  shaking spells were due to her primary metabolic abnormalities.  EEG was negative.  She was discharged with instructions to follow-up with surgery for possible parathyroidectomy.  However, the patient's daughter states that they have been following with physician staff at pace he did not feel the patient was a good surgical candidate.  The patient was subsequently continued on Cinacalcet.  Assessment/Plan: Hematemesis -Suspect Mallory-Weiss tear -combo of hypercalcemia in setting of DM gastroparesis -Appreciate GI consult -Patient has had numerous EGDs in the past for nausea and vomiting -October 2021 EGD--normal esophagus, erythematous mucosa in the antrum, normal duodenum.  Presence of bile reflux suggest gastric dysmotility or bile reflux gastritis--biopsies were negative for H. Pylori -Metoclopramide was discontinued last hospitalization was due to concerns for tardive dyskinesia -continue PPI bid -continue IVF -obtain UA and culture  Hypercalcemia/primary hyperparathyroidism -Corrected Ca = 12.0 at time of admission -hypercalemia also exacerbating  emesis -will ultimately need surgical eval  Acute on Chronic renal failure--CKD 3 b -baseline creatinine 1.4-1.6 -presented with serum creatinine 2.46 -due to volume depletion -continue IVF  Acute metabolic Encephalopathy -multifactorial including hypercalcemia, dehydration, AKI, possible UTI, hospital delirium  Tachybradysyndrome s/p PPM -follows Dr. Lovena Le  Hypertrophic Cardiomyopathy/elevated Troponin -8/24 limited echo EF 65-70; apical HCM -appreciate Dr. Harl Bowie input--has chronic ST elevation likely related to her apical hypertrophy -has had 2 prior catheterizations in the setting of vomiting with similar ST elevations-2019, 2021, both negative for significant disease. -Trop 140 > 109  Hypertension per cardiology notes 7/80/22, patient needs higher systolic blood pressure goal to reduce her risk of falls and syncope.  Patient's amlodipine and hydralazine were discontinued. 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 Andrey Spearman). Patient was continued on home aspirin 81mg .   Chronic diastolic CHF -4/25 echo EF 65-70%, G1DD -clinically hypovolemic presently    Status is: Inpatient  Remains inpatient appropriate because:Inpatient level of care appropriate due to severity of illness  Dispo: The patient is from: Home              Anticipated d/c is to: Home              Patient currently is not medically stable to d/c.   Difficult to place patient No        Family Communication:   daughter updated at bedside 8/25  Consultants:  GI  Code Status:  FULL   DVT Prophylaxis:  [place] Heparin   Procedures: As Listed in Progress Note Above  Antibiotics: None       Subjective: Patient denies fevers, chills, headache, chest pain, dyspnea, nausea, vomiting, diarrhea, abdominal pain, dysuria, hematuria, hematochezia, and melena.   Objective: Vitals:   08/02/21 0500 08/02/21 0530 08/02/21 0727 08/02/21 1100  BP: (!) 182/112  (!) 209/103   Pulse: 80 87    Resp: 17 16    Temp:   98.2 F (36.8 C) 98.4 F (36.9 C)  TempSrc:   Oral Oral  SpO2: 97% 96%    Weight:      Height:        Intake/Output Summary (Last 24 hours) at 08/02/2021 1235 Last data filed at 08/01/2021 1829 Gross per 24 hour  Intake 1250 ml  Output --  Net 1250 ml   Weight change:  Exam:  General:  Pt is alert, follows commands appropriately, not in acute distress HEENT: No icterus, No thrush,  No neck mass, [place]/AT Cardiovascular: RRR, S1/S2, no rubs, no gallops Respiratory: bibasilar rales.  No wheeze Abdomen: Soft/+BS, non tender, non distended, no guarding Extremities: No edema, No lymphangitis, No petechiae, No rashes, no synovitis   Data Reviewed: I have personally reviewed following labs and imaging studies Basic Metabolic Panel: Recent Labs  Lab 08/01/21 1254 08/01/21 1304 08/01/21 1728 08/02/21 0500  NA 136 139  --  141  K 2.7* 2.8*  --  3.0*  CL 99 103  --  109  CO2 26  --   --  24  GLUCOSE 147* 148*  --  129*  BUN 61* 55*  --  49*  CREATININE 2.46* 2.50*  --  2.04*  CALCIUM 12.0*  --   --  11.1*  MG  --   --  2.4  --    Liver Function Tests: Recent Labs  Lab 08/01/21 1254  AST 25  ALT 24  ALKPHOS 85  BILITOT 1.2  PROT 8.6*  ALBUMIN 4.6   No results for input(s): LIPASE, AMYLASE in the last 168 hours. No results for input(s): AMMONIA in the last 168 hours. Coagulation Profile: No results for input(s): INR, PROTIME in the last 168 hours. CBC: Recent Labs  Lab 08/01/21 1254 08/01/21 1304 08/01/21 1929 08/02/21 0500  WBC 27.9*  --  23.7* 20.6*  NEUTROABS 23.3*  --   --   --   HGB 16.0* 16.0* 13.5 13.7  HCT 46.5* 47.0* 40.0 40.4  MCV 93.0  --  93.7 94.6  PLT 227  --  169 170   Cardiac Enzymes: No results for input(s): CKTOTAL, CKMB, CKMBINDEX, TROPONINI in the last 168 hours. BNP: Invalid input(s): POCBNP CBG: Recent Labs  Lab 08/01/21 2244 08/02/21 0541 08/02/21 1112  GLUCAP 125* 114* 118*   HbA1C: No results for input(s): HGBA1C in the last 72 hours. Urine analysis:    Component Value Date/Time   COLORURINE STRAW (A) 08/01/2021 1426   APPEARANCEUR CLEAR 08/01/2021 1426   LABSPEC 1.009 08/01/2021 1426   PHURINE 6.0 08/01/2021 1426   GLUCOSEU 50 (A) 08/01/2021 1426   HGBUR NEGATIVE 08/01/2021 1426   BILIRUBINUR NEGATIVE 08/01/2021 1426   KETONESUR NEGATIVE 08/01/2021 1426   PROTEINUR 30 (A) 08/01/2021 1426   NITRITE  NEGATIVE 08/01/2021 1426   LEUKOCYTESUR MODERATE (A) 08/01/2021 1426   Sepsis Labs: @LABRCNTIP (procalcitonin:4,lacticidven:4) ) Recent Results (from the past 240 hour(s))  Resp Panel by RT-PCR (Flu A&B, Covid) Nasopharyngeal Swab     Status: None   Collection Time: 08/01/21  2:17 PM   Specimen: Nasopharyngeal Swab; Nasopharyngeal(NP) swabs in vial transport medium  Result Value Ref Range Status   SARS Coronavirus 2 by RT PCR NEGATIVE NEGATIVE Final    Comment: (NOTE) SARS-CoV-2 target nucleic acids are NOT DETECTED.  The SARS-CoV-2 RNA is generally detectable in upper respiratory specimens during the acute phase of infection. The lowest concentration of SARS-CoV-2 viral copies this assay can detect is 138 copies/mL. A negative result does not preclude SARS-Cov-2 infection and should not be used as the sole basis for treatment or other patient management decisions. A negative result may occur with  improper specimen collection/handling, submission of specimen other than nasopharyngeal swab, presence of viral mutation(s) within the areas targeted by this assay, and inadequate number of viral copies(<138 copies/mL). A negative result must be combined with clinical observations, patient history, and epidemiological information. The expected result is Negative.  Fact Sheet for Patients:  EntrepreneurPulse.com.au  Fact Sheet for Healthcare Providers:  IncredibleEmployment.be  This test is no t yet approved or cleared by the [place] FDA and  has been authorized for detection and/or diagnosis of SARS-CoV-2 by FDA under an Emergency Use Authorization (EUA). This EUA will remain  in effect (meaning this test can be used) for the duration of the COVID-19 declaration under Section 564(b)(1) of the Act, 21 U.S.C.section 360bbb-3(b)(1), unless the authorization is terminated  or revoked sooner.       Influenza A by PCR NEGATIVE NEGATIVE Final    Influenza B by PCR NEGATIVE NEGATIVE Final    Comment: (NOTE) The Xpert Xpress SARS-CoV-2/FLU/RSV plus assay is intended as an aid in the diagnosis of influenza from Nasopharyngeal swab specimens and should not be used as a sole basis for treatment. Nasal washings and aspirates are unacceptable for Xpert Xpress SARS-CoV-2/FLU/RSV testing.  Fact Sheet for Patients: EntrepreneurPulse.com.au  Fact Sheet for Healthcare Providers: IncredibleEmployment.be  This test is not yet approved or cleared by the [place] FDA and has been authorized for detection and/or diagnosis of SARS-CoV-2 by FDA under an Emergency Use Authorization (EUA). This EUA will remain in effect (meaning this test can be used) for the duration of the COVID-19 declaration under Section 564(b)(1) of the Act, 21 U.S.C. section 360bbb-3(b)(1), unless the authorization is terminated or revoked.  Performed at [facility], [place]  [address], [place], [place] [address]   MRSA Next Gen by PCR, Nasal     Status: None   Collection Time: 08/01/21 10:11 PM   Specimen: Nasal Mucosa; Nasal Swab  Result Value Ref Range Status   MRSA by PCR Next Gen NOT DETECTED NOT DETECTED Final    Comment: (NOTE) The GeneXpert MRSA Assay (FDA approved for NASAL specimens only), is one component of a comprehensive MRSA colonization surveillance program. It is not intended to diagnose MRSA infection nor to guide or monitor treatment for MRSA infections. Test performance is not FDA approved in patients less than 64 years old. Performed at [facility], [address], [place], [place] [address]      Scheduled Meds:  bisacodyl  10 mg Rectal Q12H   Chlorhexidine Gluconate Cloth  6 each Topical Daily   insulin aspart  0-9 Units Subcutaneous Q6H   linaclotide  72 mcg Oral QAC breakfast   pantoprazole (PROTONIX) IV  40 mg Intravenous Q12H   Continuous Infusions:  0.9 % NaCl with KCl 20 mEq / L 100 mL/hr at  08/02/21 9485    Procedures/Studies: CT ABDOMEN PELVIS WO CONTRAST  Result Date: 08/01/2021 CLINICAL DATA:  Abdominal pain, nausea and vomiting for 3 days EXAM: CT ABDOMEN AND PELVIS WITHOUT CONTRAST TECHNIQUE: Multidetector CT imaging of the abdomen and pelvis was performed following the standard protocol without IV contrast. COMPARISON:  07/31/2021, 06/13/2021, 11/09/2020 FINDINGS: Lower chest: No acute pleural or parenchymal lung disease. Stable pacemaker. Unenhanced CT was performed per clinician order. Lack of IV contrast limits sensitivity and specificity, especially for evaluation of abdominal/pelvic solid viscera. Hepatobiliary: No focal liver abnormality is seen. Status post cholecystectomy. Stable postsurgical dilatation of the common bile duct. Pancreas: Unremarkable. No pancreatic ductal dilatation or surrounding inflammatory changes. Spleen: Normal in size without focal abnormality. Adrenals/Urinary Tract: Innumerable bilateral renal cortical cysts are identified, some displaying thin peripheral calcifications. Evaluation is incomplete on unenhanced exam, but there is no gross change since the preceding study. No urinary tract calculi or obstructive uropathy. The adrenals and bladder are stable. Stomach/Bowel: No bowel obstruction or ileus. Moderate retained stool throughout the colon. The appendix, if still present, is not well visualized. No bowel wall thickening or inflammatory change. Vascular/Lymphatic: Aortic atherosclerosis. No enlarged abdominal or pelvic lymph nodes. Reproductive: Status post hysterectomy. No adnexal masses. Other: No free fluid or free gas.  No abdominal wall hernia. Musculoskeletal: There are no acute or destructive bony lesions. Stable degenerative changes of the lumbar spine and bilateral hips. Reconstructed images demonstrate no additional findings. IMPRESSION: 1. No acute intra-abdominal or intrapelvic process. 2. Stable mildly complex bilateral renal cysts,  incompletely evaluated by unenhanced exam. 3. No bowel obstruction or ileus. Moderate residual stool throughout the colon. 4.  Aortic Atherosclerosis (ICD10-I70.0). Electronically Signed   By: Randa Ngo M.D.   On: 08/01/2021 17:01   DG Abd 1 View  Result Date: 08/01/2021 CLINICAL DATA:  Abdominal pain, nausea, constipation EXAM: ABDOMEN - 1 VIEW COMPARISON:  KUB 06/14/2021 FINDINGS: There is a nonobstructive bowel gas pattern. There is a moderate colonic stool burden projecting over the rectum and right hemiabdomen. There is no gross organomegaly or abnormal soft tissue calcification. There is no acute osseous abnormality. IMPRESSION: Moderate colonic stool burden as above without evidence of mechanical obstruction. Electronically Signed   By: Valetta Mole M.D.   On: 08/01/2021 13:39   DG Chest Port 1 View  Result Date: 08/01/2021 CLINICAL DATA:  Weakness.  Abdominal pain.  Nausea and vomiting.  EXAM: PORTABLE CHEST 1 VIEW COMPARISON:  CT chest 06/13/2021 FINDINGS: Heart size is normal. Lungs are clear. No focal nodule, mass, or airspace disease is present. No edema or effusion is present. Pacemaker wires are stable. IMPRESSION: No acute cardiopulmonary disease or significant interval change. Electronically Signed   By: San Morelle M.D.   On: 08/01/2021 15:08   CUP PACEART REMOTE DEVICE CHECK  Result Date: 07/12/2021 Scheduled remote reviewed. Normal device function.  Next remote in 91 days. Kathy Breach, RN, CCDS, [employer]  ECHOCARDIOGRAM LIMITED  Result Date: 08/01/2021    ECHOCARDIOGRAM LIMITED REPORT   Patient Name:   Melissa Lopez Date of Exam: 08/01/2021 Medical Rec #:  [id number]        Height:       64.0 in Accession #:    [id number]       Weight:       144.0 lb Date of Birth:  06-Sep-1951        BSA:          1.701 m Patient Age:    5 years         BP:           188/104 mmHg Patient Gender: F                HR:           68 bpm. Exam Location:  [facility] Procedure:  2D Echo, Cardiac Doppler and Color Doppler Indications:    Abnormal ECG R94.31  History:        Patient has prior history of Echocardiogram examinations, most                 recent 06/13/2021. Hypertrophic Cardiomyopathy, CAD and STEMI (ST                 elevation myocardial infarction), Pacemaker; Risk                 Factors:Hypertension and Diabetes. Sick sinus syndrome (HCC)                 (From Hx).  Sonographer:    Alvino Chapel RCS Referring Phys: [id number] [place]  1. There is moderate asymmsetric septal hypertrophy. The apex is not completely visualized but appears to have severe apical hypertrophy with systolic apical obliteration but no significant intracavitary gradient. Findings consistent with apical hypertrophic cardiomyopathy. . Left ventricular ejection fraction, by estimation, is 65 to 70%. The left ventricle has normal function. The left ventricle has no regional wall motion abnormalities. Left ventricular diastolic parameters are consistent with Grade I diastolic dysfunction (impaired relaxation).  2. Right ventricular systolic function is normal. The right ventricular size is normal.  3. Limited study to evaluate LV function FINDINGS  Left Ventricle: There is moderate asymmsetric septal hypertrophy. The apex is not completely visualized but appears to have severe apical hypertrophy with systolic apical obliteration but no significant intracavitary gradient. Findings consistent with apical hypertrophic cardiomyopathy. Left ventricular ejection fraction, by estimation, is 65 to 70%. The left ventricle has normal function. The left ventricle has no regional wall motion abnormalities. Left ventricular diastolic parameters are consistent with Grade I diastolic dysfunction (impaired relaxation). Right Ventricle: The right ventricular size is normal. No increase in right ventricular wall thickness. Right ventricular systolic function is normal. LEFT VENTRICLE PLAX 2D LVIDd:          4.07 cm  Diastology LVIDs:  2.71 cm  LV e' lateral:   4.82 cm/s LV PW:         1.09 cm  LV E/e' lateral: 10.6 LV IVS:        1.46 cm LVOT diam:     2.30 cm LV SV:         86 LV SV Index:   51 LVOT Area:     4.15 cm  RIGHT VENTRICLE RV S prime:     12.90 cm/s TAPSE (M-mode): 1.5 cm LEFT ATRIUM           Index       RIGHT ATRIUM           Index LA diam:      3.20 cm 1.88 cm/m  RA Area:     11.50 cm LA Vol (A4C): 29.9 ml 17.58 ml/m RA Volume:   26.20 ml  15.40 ml/m  AORTIC VALVE LVOT Vmax:   120.00 cm/s LVOT Vmean:  76.100 cm/s LVOT VTI:    0.208 m  AORTA Ao Root diam: 3.60 cm MITRAL VALVE MV Area (PHT): 3.15 cm     SHUNTS MV Decel Time: 241 msec     Systemic VTI:  0.21 m MV E velocity: 51.00 cm/s   Systemic Diam: 2.30 cm MV A velocity: 115.00 cm/s MV E/A ratio:  0.44 Carlyle Dolly MD Electronically signed by Carlyle Dolly MD Signature Date/Time: 08/01/2021/3:51:22 PM    Final     Orson Eva, DO  [facility]  If 7PM-7AM, please contact night-coverage [email or web address] Password [place] 08/02/2021, 12:35 PM   LOS: 1 day

## 2021-08-02 NOTE — Progress Notes (Signed)
Patient discussed with ER staff yesterday, chart and data reviewed. She presented with N/V, hematemsis. Cardiology contacted for abnormal EKG. She has chronic ST elevation likely related to her apical hypertrophy. Has had caths 10/2018 and 11/2020 for these exact EKG changes that showed no culprit vessel. Has had mild trops in the past during prior admissions, mild trop this admission trending down likely demand ischemia in setting of systemic illness. Echo this admit shows hyperdynamic LV with apical hypertrophy. No plans for any additional cardiac testing at this time, management of N/V and hematemesis per primary team.   Carlyle Dolly MD

## 2021-08-03 ENCOUNTER — Encounter (HOSPITAL_COMMUNITY)
Admission: EM | Disposition: A | Discharge status: Home / Self Care | Payer: Self-pay | Source: Home / Self Care | Attending: Internal Medicine

## 2021-08-03 ENCOUNTER — Inpatient Hospital Stay (HOSPITAL_COMMUNITY): Payer: Medicare (Managed Care) | Admitting: Anesthesiology

## 2021-08-03 ENCOUNTER — Encounter (HOSPITAL_COMMUNITY): Payer: Self-pay | Admitting: Internal Medicine

## 2021-08-03 ENCOUNTER — Other Ambulatory Visit: Payer: Self-pay

## 2021-08-03 DIAGNOSIS — R1310 Dysphagia, unspecified: Secondary | ICD-10-CM | POA: Diagnosis not present

## 2021-08-03 DIAGNOSIS — E210 Primary hyperparathyroidism: Secondary | ICD-10-CM

## 2021-08-03 DIAGNOSIS — K920 Hematemesis: Secondary | ICD-10-CM

## 2021-08-03 HISTORY — PX: BIOPSY: SHX5522

## 2021-08-03 HISTORY — PX: ESOPHAGOGASTRODUODENOSCOPY (EGD) WITH PROPOFOL: SHX5813

## 2021-08-03 LAB — COMPREHENSIVE METABOLIC PANEL
ALT: 36 U/L (ref 0–44)
AST: 31 U/L (ref 15–41)
Albumin: 3.3 g/dL — ABNORMAL LOW (ref 3.5–5.0)
Alkaline Phosphatase: 69 U/L (ref 38–126)
Anion gap: 7 (ref 5–15)
BUN: 48 mg/dL — ABNORMAL HIGH (ref 8–23)
CO2: 26 mmol/L (ref 22–32)
Calcium: 11.6 mg/dL — ABNORMAL HIGH (ref 8.9–10.3)
Chloride: 113 mmol/L — ABNORMAL HIGH (ref 98–111)
Creatinine, Ser: 2.09 mg/dL — ABNORMAL HIGH (ref 0.44–1.00)
GFR, Estimated: 25 mL/min — ABNORMAL LOW (ref >60)
Glucose, Bld: 120 mg/dL — ABNORMAL HIGH (ref 70–99)
Potassium: 3.4 mmol/L — ABNORMAL LOW (ref 3.5–5.1)
Sodium: 146 mmol/L — ABNORMAL HIGH (ref 135–145)
Total Bilirubin: 0.9 mg/dL (ref 0.3–1.2)
Total Protein: 6.2 g/dL — ABNORMAL LOW (ref 6.5–8.1)

## 2021-08-03 LAB — CBC
HCT: 38.8 % (ref 36.0–46.0)
Hemoglobin: 12.6 g/dL (ref 12.0–15.0)
MCH: 31.7 pg (ref 26.0–34.0)
MCHC: 32.5 g/dL (ref 30.0–36.0)
MCV: 97.5 fL (ref 80.0–100.0)
Platelets: 175 10*3/uL (ref 150–400)
RBC: 3.98 MIL/uL (ref 3.87–5.11)
RDW: 13.9 % (ref 11.5–15.5)
WBC: 19.1 10*3/uL — ABNORMAL HIGH (ref 4.0–10.5)
nRBC: 0 % (ref 0.0–0.2)

## 2021-08-03 LAB — CORTISOL-AM, BLOOD: Cortisol - AM: 24.2 ug/dL — ABNORMAL HIGH (ref 6.7–22.6)

## 2021-08-03 SURGERY — ESOPHAGOGASTRODUODENOSCOPY (EGD) WITH PROPOFOL
Anesthesia: General

## 2021-08-03 MED ORDER — LIDOCAINE HCL (PF) 2 % IJ SOLN
INTRAMUSCULAR | Status: AC
  Filled 2021-08-03: qty 5

## 2021-08-03 MED ORDER — LIDOCAINE HCL (CARDIAC) PF 100 MG/5ML IV SOSY
PREFILLED_SYRINGE | INTRAVENOUS | Status: DC | PRN
  Administered 2021-08-03: 100 mg via INTRAVENOUS

## 2021-08-03 MED ORDER — SENNA 8.6 MG PO TABS
2.0000 | ORAL_TABLET | Freq: Every day | ORAL | Status: DC
  Administered 2021-08-04 – 2021-08-07 (×4): 17.2 mg via ORAL
  Filled 2021-08-03 (×4): qty 2

## 2021-08-03 MED ORDER — CARVEDILOL 3.125 MG PO TABS
3.1250 mg | ORAL_TABLET | Freq: Two times a day (BID) | ORAL | Status: DC
  Administered 2021-08-03 – 2021-08-04 (×2): 3.125 mg via ORAL
  Filled 2021-08-03 (×2): qty 1

## 2021-08-03 MED ORDER — FENTANYL CITRATE PF 50 MCG/ML IJ SOSY
PREFILLED_SYRINGE | INTRAMUSCULAR | Status: AC
  Administered 2021-08-03: 50 ug
  Filled 2021-08-03: qty 1

## 2021-08-03 MED ORDER — POLYETHYLENE GLYCOL 3350 17 G PO PACK
17.0000 g | PACK | Freq: Two times a day (BID) | ORAL | Status: DC
  Administered 2021-08-03 – 2021-08-07 (×8): 17 g via ORAL
  Filled 2021-08-03 (×8): qty 1

## 2021-08-03 MED ORDER — FENTANYL CITRATE (PF) 100 MCG/2ML IJ SOLN
50.0000 ug | INTRAMUSCULAR | Status: DC | PRN
  Administered 2021-08-03: 50 ug via INTRAVENOUS
  Filled 2021-08-03: qty 2

## 2021-08-03 MED ORDER — SODIUM CHLORIDE 0.9 % IV SOLN: INTRAVENOUS | Status: DC

## 2021-08-03 MED ORDER — PROPOFOL 10 MG/ML IV BOLUS
INTRAVENOUS | Status: AC
  Filled 2021-08-03: qty 20

## 2021-08-03 MED ORDER — POTASSIUM CHLORIDE IN NACL 20-0.45 MEQ/L-% IV SOLN
INTRAVENOUS | Status: DC
  Filled 2021-08-03 (×8): qty 1000

## 2021-08-03 MED ORDER — SODIUM CHLORIDE 0.9 % IV SOLN
1.0000 g | INTRAVENOUS | Status: DC
  Administered 2021-08-03 – 2021-08-05 (×3): 1 g via INTRAVENOUS
  Filled 2021-08-03 (×3): qty 10

## 2021-08-03 MED ORDER — METOCLOPRAMIDE HCL 5 MG/ML IJ SOLN
5.0000 mg | Freq: Four times a day (QID) | INTRAMUSCULAR | Status: DC
  Administered 2021-08-03 – 2021-08-06 (×11): 5 mg via INTRAVENOUS
  Filled 2021-08-03 (×11): qty 2

## 2021-08-03 MED ORDER — LACTATED RINGERS IV SOLN: INTRAVENOUS | Status: DC

## 2021-08-03 MED ORDER — PROPOFOL 10 MG/ML IV BOLUS
INTRAVENOUS | Status: DC | PRN
Start: 2021-08-03 — End: 2021-08-03
  Administered 2021-08-03 (×2): 50 mg via INTRAVENOUS

## 2021-08-03 MED ORDER — HYDROCODONE-ACETAMINOPHEN 5-325 MG PO TABS
1.0000 | ORAL_TABLET | Freq: Four times a day (QID) | ORAL | Status: DC | PRN
  Administered 2021-08-03 – 2021-08-07 (×15): 1 via ORAL
  Filled 2021-08-03 (×16): qty 1

## 2021-08-03 MED ORDER — LINACLOTIDE 145 MCG PO CAPS
145.0000 ug | ORAL_CAPSULE | Freq: Every day | ORAL | Status: DC
  Administered 2021-08-04 – 2021-08-05 (×2): 145 ug via ORAL
  Filled 2021-08-03 (×3): qty 1

## 2021-08-03 NOTE — Transfer of Care (Signed)
Immediate Anesthesia Transfer of Care Note  Patient: Melissa Lopez  Procedure(s) Performed: ESOPHAGOGASTRODUODENOSCOPY (EGD) WITH PROPOFOL BIOPSY  Patient Location: PACU  Anesthesia Type:General  Level of Consciousness: awake and alert   Airway & Oxygen Therapy: Patient Spontanous Breathing  Post-op Assessment: Report given to RN and Post -op Vital signs reviewed and stable  Post vital signs: Reviewed and stable  Last Vitals:  Vitals Value Taken Time  BP 152/88 08/03/21  1520  Temp    Pulse 70 08/03/21  1520  Resp 21 08/03/21  1520  SpO2 97 08/03/21 1520    Last Pain:  Vitals:   08/03/21 1252  TempSrc: Oral  PainSc: 8       Patients Stated Pain Goal: 2 (39/68/86 4847)  Complications: No notable events documented.

## 2021-08-03 NOTE — Progress Notes (Signed)
PROGRESS NOTE  Melissa Lopez KTG:[id number] DOB: [birth year] DOA: 08/01/2021 PCP: [facility] [place]      Brief History:  70 year old female with a history of CKD stage III, coronary artery disease, sick sinus syndrome status post permanent pacemaker, primary hyperparathyroidism, diabetes mellitus with gastroparesis, and hypertension presenting with nausea and vomiting that began on 07/27/2021.  The patient has had vomiting on a daily basis.  However the patient's daughter noted that she began having hematemesis on the morning of 08/01/2021.  She described it as a 1-1/2 cups of blood.  As result, the patient was brought to the hospital for further evaluation.  The patient has not had any fevers, chills, chest pain, shortness of breath, palpitations, syncope.  She has not had any diarrhea, hematochezia, melena.  In fact, the patient has trouble with constipation.  There is not been any new medications.  The patient has not had any recent travels or eaten any undercooked or exotic foods.  She has not had any sick contacts.  Patient has been complaining of intermittent abdominal pain.  She has not been taking any NSAIDs. In the emergency department, the patient was afebrile and hemodynamically stable with oxygen saturation 96/97% on room air.  CT of the abdomen and pelvis showed stable CBD dilatation.  There were innumerable bilateral renal cysts without hydronephrosis.  There is moderate stool in the colon.  WBC was 27.9 with hemoglobin 16.0 and platelets 170,000.  BMP showed serum creatinine 2.46.  The patient was started on IV fluids.  GI was consulted to assist with management.   Notably, the patient had a recent hospital admission from 06/12/2021 to 06/18/2021 at which time she was treated for hypercalcemia, AKI, and shaking spells.  Her hypercalcemia improved with IV fluids.  It was felt this was due to primary hyperparathyroidism.  She was seen by neurology and felt  that shaking spells were due to her primary metabolic abnormalities.  EEG was negative.  She was discharged with instructions to follow-up with surgery for possible parathyroidectomy.  However, the patient's daughter states that they have been following with physician staff at pace he did not feel the patient was a good surgical candidate.  The patient was subsequently continued on Cinacalcet.   Assessment/Plan: Hematemesis/Intractable vomiting -Suspect Mallory-Weiss tear -combo of hypercalcemia in setting of DM gastroparesis -Appreciate GI consult>>planning EGD 8/26 -Patient has had numerous EGDs in the past for nausea and vomiting -October 2021 EGD--normal esophagus, erythematous mucosa in the antrum, normal duodenum.  Presence of bile reflux suggest gastric dysmotility or bile reflux gastritis--biopsies were negative for H. Pylori -Metoclopramide was discontinued last hospitalization was due to concerns for tardive dyskinesia -continue PPI bid -continue IVF -holding miralax and senna per GI since Linzess started -AM cortisol 24.2   Hypercalcemia/primary hyperparathyroidism -Corrected Ca = 12.0 at time of admission -8/26--Corrected Ca = 11.9 -restart IVF -hypercalemia also exacerbating  emesis -will ultimately need surgical eval   Acute on Chronic renal failure--CKD 3 b -baseline creatinine 1.4-1.6 -presented with serum creatinine 2.46 -due to volume depletion -continue IVF  Pyuria -UA = 11-20 WBC -follow urine culture -start empiric ceftriaxone   Acute metabolic Encephalopathy -multifactorial including hypercalcemia, dehydration, AKI, possible UTI, hospital delirium -8/26--improving but not back to baseline   Tachybradysyndrome s/p PPM -follows Dr. Lovena Le   Hypertrophic Cardiomyopathy/elevated Troponin -8/24 limited echo EF 65-70; apical HCM -appreciate Dr. Harl Bowie input--has chronic ST elevation likely related to her apical  hypertrophy -has had 2 prior catheterizations  in the setting of vomiting with similar ST elevations-2019, 2021, both negative for significant disease. -Trop 140 > 109   Hypertension -patient needs higher systolic blood pressure goal to reduce her risk of falls and syncope.  Patient's amlodipine and hydralazine were discontinued. -she was discharged with coreg and amlodipine in July 2022 but appears amlodipine d/ced since then -prn hydralazine Likely she will need a higher systolic BP goal of <803 due to her symptomatic hypotension -restart low dose coreg   DM2 with gastroparesis and orthostasis/dysautonomia -no longer on meds per daughter -08/02/21 A1C--5.5 -d/c sliding scale   Hypokalemia -replete -mag 2.4 -add KCl to IVF   Prior CVA R Thalamic lacunar stroke Residual left sided numbness per most recent Neurology note (Dr. Andrey Spearman). Patient was continued on home aspirin 81mg .    Chronic diastolic CHF -2/12 echo EF 65-70%, G1DD -clinically hypovolemic presently       Status is: Inpatient   Remains inpatient appropriate because:Inpatient level of care appropriate due to severity of illness   Dispo: The patient is from: Home              Anticipated d/c is to: Home              Patient currently is not medically stable to d/c.              Difficult to place patient No               Family Communication:   daughter updated 8/26   Consultants:  GI   Code Status:  FULL    DVT Prophylaxis:  [place] Heparin     Procedures: As Listed in Progress Note Above   Antibiotics: None   Subjective: Patient complains of LLQ pain.  States left hip pain is chronic.  Denies f/c cp, sob, n/v/d, headache  Objective: Vitals:   08/03/21 0800 08/03/21 0810 08/03/21 0900 08/03/21 0933  BP:    (!) 183/94  Pulse: 79 81 79   Resp: (!) 22 (!) 29 16   Temp:      TempSrc:      SpO2: 97%     Weight:      Height:        Intake/Output Summary (Last 24 hours) at 08/03/2021 1101 Last data filed at 08/03/2021 0903 Gross  per 24 hour  Intake 2330.7 ml  Output 425 ml  Net 1905.7 ml   Weight change:  Exam:  General:  Pt is alert, follows commands appropriately, not in acute distress HEENT: No icterus, No thrush, No neck mass, [place]/AT Cardiovascular: RRR, S1/S2, no rubs, no gallops Respiratory: bibasilar crackles. No wheeze Abdomen: Soft/+BS, non tender, non distended, no guarding Extremities: No edema, No lymphangitis, No petechiae, No rashes, no synovitis   Data Reviewed: I have personally reviewed following labs and imaging studies Basic Metabolic Panel: Recent Labs  Lab 08/01/21 1254 08/01/21 1304 08/01/21 1728 08/02/21 0500 08/03/21 0502  NA 136 139  --  141 146*  K 2.7* 2.8*  --  3.0* 3.4*  CL 99 103  --  109 113*  CO2 26  --   --  24 26  GLUCOSE 147* 148*  --  129* 120*  BUN 61* 55*  --  49* 48*  CREATININE 2.46* 2.50*  --  2.04* 2.09*  CALCIUM 12.0*  --   --  11.1* 11.6*  MG  --   --  2.4  --   --  Liver Function Tests: Recent Labs  Lab 08/01/21 1254 08/03/21 0502  AST 25 31  ALT 24 36  ALKPHOS 85 69  BILITOT 1.2 0.9  PROT 8.6* 6.2*  ALBUMIN 4.6 3.3*   No results for input(s): LIPASE, AMYLASE in the last 168 hours. No results for input(s): AMMONIA in the last 168 hours. Coagulation Profile: No results for input(s): INR, PROTIME in the last 168 hours. CBC: Recent Labs  Lab 08/01/21 1254 08/01/21 1304 08/01/21 1929 08/02/21 0500 08/03/21 0502  WBC 27.9*  --  23.7* 20.6* 19.1*  NEUTROABS 23.3*  --   --   --   --   HGB 16.0* 16.0* 13.5 13.7 12.6  HCT 46.5* 47.0* 40.0 40.4 38.8  MCV 93.0  --  93.7 94.6 97.5  PLT 227  --  169 170 175   Cardiac Enzymes: No results for input(s): CKTOTAL, CKMB, CKMBINDEX, TROPONINI in the last 168 hours. BNP: Invalid input(s): POCBNP CBG: Recent Labs  Lab 08/01/21 2244 08/02/21 0541 08/02/21 1112  GLUCAP 125* 114* 118*   HbA1C: Recent Labs    08/02/21 0500  HGBA1C 5.5   Urine analysis:    Component Value Date/Time    COLORURINE YELLOW 08/02/2021 1650   APPEARANCEUR HAZY (A) 08/02/2021 1650   LABSPEC 1.013 08/02/2021 1650   PHURINE 7.0 08/02/2021 1650   GLUCOSEU 50 (A) 08/02/2021 1650   HGBUR NEGATIVE 08/02/2021 1650   BILIRUBINUR NEGATIVE 08/02/2021 1650   KETONESUR NEGATIVE 08/02/2021 1650   PROTEINUR 100 (A) 08/02/2021 1650   NITRITE NEGATIVE 08/02/2021 1650   LEUKOCYTESUR SMALL (A) 08/02/2021 1650   Sepsis Labs: @LABRCNTIP (procalcitonin:4,lacticidven:4) ) Recent Results (from the past 240 hour(s))  Resp Panel by RT-PCR (Flu A&B, Covid) Nasopharyngeal Swab     Status: None   Collection Time: 08/01/21  2:17 PM   Specimen: Nasopharyngeal Swab; Nasopharyngeal(NP) swabs in vial transport medium  Result Value Ref Range Status   SARS Coronavirus 2 by RT PCR NEGATIVE NEGATIVE Final    Comment: (NOTE) SARS-CoV-2 target nucleic acids are NOT DETECTED.  The SARS-CoV-2 RNA is generally detectable in upper respiratory specimens during the acute phase of infection. The lowest concentration of SARS-CoV-2 viral copies this assay can detect is 138 copies/mL. A negative result does not preclude SARS-Cov-2 infection and should not be used as the sole basis for treatment or other patient management decisions. A negative result may occur with  improper specimen collection/handling, submission of specimen other than nasopharyngeal swab, presence of viral mutation(s) within the areas targeted by this assay, and inadequate number of viral copies(<138 copies/mL). A negative result must be combined with clinical observations, patient history, and epidemiological information. The expected result is Negative.  Fact Sheet for Patients:  EntrepreneurPulse.com.au  Fact Sheet for Healthcare Providers:  IncredibleEmployment.be  This test is no t yet approved or cleared by the [place] FDA and  has been authorized for detection and/or diagnosis of SARS-CoV-2 by FDA under an  Emergency Use Authorization (EUA). This EUA will remain  in effect (meaning this test can be used) for the duration of the COVID-19 declaration under Section 564(b)(1) of the Act, 21 U.S.C.section 360bbb-3(b)(1), unless the authorization is terminated  or revoked sooner.       Influenza A by PCR NEGATIVE NEGATIVE Final   Influenza B by PCR NEGATIVE NEGATIVE Final    Comment: (NOTE) The Xpert Xpress SARS-CoV-2/FLU/RSV plus assay is intended as an aid in the diagnosis of influenza from Nasopharyngeal swab specimens and should not be used as  a sole basis for treatment. Nasal washings and aspirates are unacceptable for Xpert Xpress SARS-CoV-2/FLU/RSV testing.  Fact Sheet for Patients: EntrepreneurPulse.com.au  Fact Sheet for Healthcare Providers: IncredibleEmployment.be  This test is not yet approved or cleared by the [place] FDA and has been authorized for detection and/or diagnosis of SARS-CoV-2 by FDA under an Emergency Use Authorization (EUA). This EUA will remain in effect (meaning this test can be used) for the duration of the COVID-19 declaration under Section 564(b)(1) of the Act, 21 U.S.C. section 360bbb-3(b)(1), unless the authorization is terminated or revoked.  Performed at [facility], [address] [place], [place] [address]   MRSA Next Gen by PCR, Nasal     Status: None   Collection Time: 08/01/21 10:11 PM   Specimen: Nasal Mucosa; Nasal Swab  Result Value Ref Range Status   MRSA by PCR Next Gen NOT DETECTED NOT DETECTED Final    Comment: (NOTE) The GeneXpert MRSA Assay (FDA approved for NASAL specimens only), is one component of a comprehensive MRSA colonization surveillance program. It is not intended to diagnose MRSA infection nor to guide or monitor treatment for MRSA infections. Test performance is not FDA approved in patients less than 14 years old. Performed at [facility], [address], [place], [place]  [address]      Scheduled Meds:  Chlorhexidine Gluconate Cloth  6 each Topical Daily   linaclotide  72 mcg Oral QAC breakfast   pantoprazole (PROTONIX) IV  40 mg Intravenous Q12H   polyethylene glycol  17 g Oral BID   senna  2 tablet Oral Daily   Continuous Infusions:  0.45 % NaCl with KCl 20 mEq / L 100 mL/hr at 08/03/21 0957   promethazine (PHENERGAN) injection (IM or IVPB)      Procedures/Studies: CT ABDOMEN PELVIS WO CONTRAST  Result Date: 08/01/2021 CLINICAL DATA:  Abdominal pain, nausea and vomiting for 3 days EXAM: CT ABDOMEN AND PELVIS WITHOUT CONTRAST TECHNIQUE: Multidetector CT imaging of the abdomen and pelvis was performed following the standard protocol without IV contrast. COMPARISON:  07/31/2021, 06/13/2021, 11/09/2020 FINDINGS: Lower chest: No acute pleural or parenchymal lung disease. Stable pacemaker. Unenhanced CT was performed per clinician order. Lack of IV contrast limits sensitivity and specificity, especially for evaluation of abdominal/pelvic solid viscera. Hepatobiliary: No focal liver abnormality is seen. Status post cholecystectomy. Stable postsurgical dilatation of the common bile duct. Pancreas: Unremarkable. No pancreatic ductal dilatation or surrounding inflammatory changes. Spleen: Normal in size without focal abnormality. Adrenals/Urinary Tract: Innumerable bilateral renal cortical cysts are identified, some displaying thin peripheral calcifications. Evaluation is incomplete on unenhanced exam, but there is no gross change since the preceding study. No urinary tract calculi or obstructive uropathy. The adrenals and bladder are stable. Stomach/Bowel: No bowel obstruction or ileus. Moderate retained stool throughout the colon. The appendix, if still present, is not well visualized. No bowel wall thickening or inflammatory change. Vascular/Lymphatic: Aortic atherosclerosis. No enlarged abdominal or pelvic lymph nodes. Reproductive: Status post hysterectomy. No adnexal  masses. Other: No free fluid or free gas.  No abdominal wall hernia. Musculoskeletal: There are no acute or destructive bony lesions. Stable degenerative changes of the lumbar spine and bilateral hips. Reconstructed images demonstrate no additional findings. IMPRESSION: 1. No acute intra-abdominal or intrapelvic process. 2. Stable mildly complex bilateral renal cysts, incompletely evaluated by unenhanced exam. 3. No bowel obstruction or ileus. Moderate residual stool throughout the colon. 4.  Aortic Atherosclerosis (ICD10-I70.0). Electronically Signed   By: Diana Eves.D.  On: 08/01/2021 17:01   DG Abd 1 View  Result Date: 08/01/2021 CLINICAL DATA:  Abdominal pain, nausea, constipation EXAM: ABDOMEN - 1 VIEW COMPARISON:  KUB 06/14/2021 FINDINGS: There is a nonobstructive bowel gas pattern. There is a moderate colonic stool burden projecting over the rectum and right hemiabdomen. There is no gross organomegaly or abnormal soft tissue calcification. There is no acute osseous abnormality. IMPRESSION: Moderate colonic stool burden as above without evidence of mechanical obstruction. Electronically Signed   By: Valetta Mole M.D.   On: 08/01/2021 13:39   DG Chest Port 1 View  Result Date: 08/01/2021 CLINICAL DATA:  Weakness.  Abdominal pain.  Nausea and vomiting. EXAM: PORTABLE CHEST 1 VIEW COMPARISON:  CT chest 06/13/2021 FINDINGS: Heart size is normal. Lungs are clear. No focal nodule, mass, or airspace disease is present. No edema or effusion is present. Pacemaker wires are stable. IMPRESSION: No acute cardiopulmonary disease or significant interval change. Electronically Signed   By: San Morelle M.D.   On: 08/01/2021 15:08   CUP PACEART REMOTE DEVICE CHECK  Result Date: 07/12/2021 Scheduled remote reviewed. Normal device function.  Next remote in 91 days. Kathy Breach, RN, CCDS, [employer]  ECHOCARDIOGRAM LIMITED  Result Date: 08/01/2021    ECHOCARDIOGRAM LIMITED REPORT    Patient Name:   JANISHA BUESO Date of Exam: 08/01/2021 Medical Rec #:  [id number]        Height:       64.0 in Accession #:    [id number]       Weight:       144.0 lb Date of Birth:  12-13-1950        BSA:          1.701 m Patient Age:    33 years         BP:           188/104 mmHg Patient Gender: F                HR:           68 bpm. Exam Location:  [facility] Procedure: 2D Echo, Cardiac Doppler and Color Doppler Indications:    Abnormal ECG R94.31  History:        Patient has prior history of Echocardiogram examinations, most                 recent 06/13/2021. Hypertrophic Cardiomyopathy, CAD and STEMI (ST                 elevation myocardial infarction), Pacemaker; Risk                 Factors:Hypertension and Diabetes. Sick sinus syndrome (HCC)                 (From Hx).  Sonographer:    Alvino Chapel RCS Referring Phys: [id number] [place]  1. There is moderate asymmsetric septal hypertrophy. The apex is not completely visualized but appears to have severe apical hypertrophy with systolic apical obliteration but no significant intracavitary gradient. Findings consistent with apical hypertrophic cardiomyopathy. . Left ventricular ejection fraction, by estimation, is 65 to 70%. The left ventricle has normal function. The left ventricle has no regional wall motion abnormalities. Left ventricular diastolic parameters are consistent with Grade I diastolic dysfunction (impaired relaxation).  2. Right ventricular systolic function is normal. The right ventricular size is normal.  3. Limited study to evaluate LV function FINDINGS  Left Ventricle: There is moderate asymmsetric  septal hypertrophy. The apex is not completely visualized but appears to have severe apical hypertrophy with systolic apical obliteration but no significant intracavitary gradient. Findings consistent with apical hypertrophic cardiomyopathy. Left ventricular ejection fraction, by estimation, is 65 to 70%. The left ventricle has  normal function. The left ventricle has no regional wall motion abnormalities. Left ventricular diastolic parameters are consistent with Grade I diastolic dysfunction (impaired relaxation). Right Ventricle: The right ventricular size is normal. No increase in right ventricular wall thickness. Right ventricular systolic function is normal. LEFT VENTRICLE PLAX 2D LVIDd:         4.07 cm  Diastology LVIDs:         2.71 cm  LV e' lateral:   4.82 cm/s LV PW:         1.09 cm  LV E/e' lateral: 10.6 LV IVS:        1.46 cm LVOT diam:     2.30 cm LV SV:         86 LV SV Index:   51 LVOT Area:     4.15 cm  RIGHT VENTRICLE RV S prime:     12.90 cm/s TAPSE (M-mode): 1.5 cm LEFT ATRIUM           Index       RIGHT ATRIUM           Index LA diam:      3.20 cm 1.88 cm/m  RA Area:     11.50 cm LA Vol (A4C): 29.9 ml 17.58 ml/m RA Volume:   26.20 ml  15.40 ml/m  AORTIC VALVE LVOT Vmax:   120.00 cm/s LVOT Vmean:  76.100 cm/s LVOT VTI:    0.208 m  AORTA Ao Root diam: 3.60 cm MITRAL VALVE MV Area (PHT): 3.15 cm     SHUNTS MV Decel Time: 241 msec     Systemic VTI:  0.21 m MV E velocity: 51.00 cm/s   Systemic Diam: 2.30 cm MV A velocity: 115.00 cm/s MV E/A ratio:  0.44 Carlyle Dolly MD Electronically signed by Carlyle Dolly MD Signature Date/Time: 08/01/2021/3:51:22 PM    Final     Orson Eva, DO  [facility]  If 7PM-7AM, please contact night-coverage [email or web address] Password [email or web address] 08/03/2021, 11:01 AM   LOS: 2 days

## 2021-08-03 NOTE — Progress Notes (Signed)
Report called to [place], Therapist, sports. Patient transferred to room 325. Bed locked in low position and call bell in reach.

## 2021-08-03 NOTE — Op Note (Signed)
[facility] Patient Name: Melissa Lopez Procedure Date: 08/03/2021 2:54 PM MRN: [id number] Date of Birth: 1951-06-16 Attending MD: Norvel Richards , MD CSN: [id number] Age: 70 Admit Type: Inpatient Procedure:                Upper GI endoscopy Indications:              Dysphagia, Hematemesis Providers:                Norvel Richards, MD, Janeece Riggers, RN, Aram Candela Referring MD:              Medicines:                Propofol per Anesthesia Complications:            No immediate complications. Estimated Blood Loss:     Estimated blood loss was minimal. Procedure:                Pre-Anesthesia Assessment:                           - Prior to the procedure, a History and Physical                            was performed, and patient medications and                            allergies were reviewed. The patient's tolerance of                            previous anesthesia was also reviewed. The risks                            and benefits of the procedure and the sedation                            options and risks were discussed with the patient.                            All questions were answered, and informed consent                            was obtained. Prior Anticoagulants: The patient has                            taken no previous anticoagulant or antiplatelet                            agents. ASA Grade Assessment: III - A patient with                            severe systemic disease. After reviewing the risks  and benefits, the patient was deemed in                            satisfactory condition to undergo the procedure.                           After obtaining informed consent, the endoscope was                            passed under direct vision. Throughout the                            procedure, the patient's blood pressure, pulse, and                            oxygen saturations  were monitored continuously. The                            GIF-H190 ([id number]) scope was introduced through the                            mouth, and advanced to the second part of duodenum.                            The upper GI endoscopy was accomplished without                            difficulty. The patient tolerated the procedure                            well. Scope In: 3:09:20 PM Scope Out: 3:15:28 PM Total Procedure Duration: 0 hours 6 minutes 8 seconds  Findings:      Severe reflux esophagitis with denuded mucosa extending halfway up the       tubular esophagus with overlying exudate. Tubular esophagus was patent       throughout its course. Large amount of thick viscous bile stained fluid       within the stomach. It was suctioned out. Gastric mucosa was diffusely       erythematous/macerated. No ulcer or infiltrating process seen. Pylorus       patent. Examination of bulb second portion revealed no abnormalities.       Biopsies x2 of the mid esophageal body taken for histologic study. No       dilation needed or performed. Impression:               - Reflux esophagitis (severe) - status post biopsy.                            Bile gastritis. I suspect delayed gastric emptying                            of multifactorial etiology.                           -I discussed my findings with the patient's  daughter [name] at length [phone] states                            patient was doing much better on Reglan previously.                            Patient has seizure-like episodes when she has some                            shaking. These are discrete episodes which occurred                            previously while on Reglan and have continued with                            discontinuation of Reglan. I do not think this is a                            side effect of Reglan and I think she would greatly                            benefit  from going back on this prokinetic agent. I                            discussed this with the patient's daughter. She                            would very much like her to go back on Reglan. I                            suggested we resume a low dose. In addition, use of                            cannabis cannot be recommended as this may well be                            exacerbate delayed gastric emptying. I recommended                            she discontinue this behavior. Moderate Sedation:      Moderate (conscious) sedation was personally administered by an       anesthesia professional. The following parameters were monitored: oxygen       saturation, heart rate, blood pressure, respiratory rate, EKG, adequacy       of pulmonary ventilation, and response to care. Recommendation:           - Return patient to hospital ward for ongoing care.                            We will begin Reglan 5 mg IV before meals and at  bedtime - -observe for intolerances. Continue twice                            daily PPI therapy. This needs to be continued as an                            oral agent post hospitalization.                           -Advance to a bariatric type diet. Follow-up on                            pathology. Linzess 145 daily for constipation. Procedure Code(s):        --- Professional ---                           [phone], Esophagogastroduodenoscopy, flexible,                            transoral; diagnostic, including collection of                            specimen(s) by brushing or washing, when performed                            (separate procedure) Diagnosis Code(s):        --- Professional ---                           R13.10, Dysphagia, unspecified                           K92.0, Hematemesis CPT copyright 2019 American Medical Association. All rights reserved. The codes documented in this report are preliminary and upon coder review may   be revised to meet current compliance requirements. Cristopher Estimable. Tondalaya Perren, MD Norvel Richards, MD 08/03/2021 3:43:44 PM This report has been signed electronically. Number of Addenda: 0

## 2021-08-03 NOTE — Interval H&P Note (Signed)
History and Physical Interval Note:  08/03/2021 3:01 PM  Melissa Lopez  has presented today for surgery, with the diagnosis of hematemesis, chronic N/V, epigastric pain, dysphagia.  The various methods of treatment have been discussed with the patient and family. After consideration of risks, benefits and other options for treatment, the patient has consented to  Procedure(s): ESOPHAGOGASTRODUODENOSCOPY (EGD) WITH PROPOFOL (N/A) ESOPHAGEAL DILATION (N/A) as a surgical intervention.  The patient's history has been reviewed, patient examined, no change in status, stable for surgery.  I have reviewed the patient's chart and labs.  Questions were answered to the patient's satisfaction.     Manus Rudd

## 2021-08-03 NOTE — Progress Notes (Signed)
Consent obtained by Ernestene Mention, RN, nurse who is orienting with myself today. Consent signed and placed in front of chart, will continue to monitor.

## 2021-08-03 NOTE — Anesthesia Preprocedure Evaluation (Addendum)
Anesthesia Evaluation  Patient identified by MRN, date of birth, ID band Patient awake    Reviewed: Allergy & Precautions, NPO status , Patient's Chart, lab work & pertinent test results, reviewed documented beta blocker date and time   History of Anesthesia Complications Negative for: history of anesthetic complications  Airway Mallampati: II  TM Distance: >3 FB Neck ROM: Full    Dental  (+) Dental Advisory Given, Edentulous Upper, Loose, Chipped,    Pulmonary former smoker, PE   Pulmonary exam normal breath sounds clear to auscultation       Cardiovascular Exercise Tolerance: Poor hypertension, Pt. on medications and Pt. on home beta blockers + CAD, + CABG and +CHF  + pacemaker (SSS)  Rhythm:Regular Rate:Normal + Systolic murmurs    Neuro/Psych Seizures -,     GI/Hepatic GERD  Medicated,  Endo/Other  diabetes, Well Controlled, Type 2, Insulin Dependent  Renal/GU Renal InsufficiencyRenal disease (AKI)     Musculoskeletal  (+) Arthritis ,   Abdominal   Peds  Hematology negative hematology ROS (+)   Anesthesia Other Findings   Reproductive/Obstetrics                            Anesthesia Physical Anesthesia Plan  ASA: 4  Anesthesia Plan: General   Post-op Pain Management:    Induction: Intravenous  PONV Risk Score and Plan: Propofol infusion  Airway Management Planned: Nasal Cannula and Natural Airway  Additional Equipment:   Intra-op Plan:   Post-operative Plan:   Informed Consent: I have reviewed the patients History and Physical, chart, labs and discussed the procedure including the risks, benefits and alternatives for the proposed anesthesia with the patient or authorized representative who has indicated his/her understanding and acceptance.     Dental advisory given  Plan Discussed with: CRNA and Surgeon  Anesthesia Plan Comments:         Anesthesia Quick  Evaluation

## 2021-08-03 NOTE — Progress Notes (Signed)
Patient briefly seen. She is much more alert today and coherent. She was able to tolerate diet last night. No further vomiting. Still no BM on Linzess. Continues to have chronic LLQ pain/hip pain. Noted epigastric pain as well. Discussed with Dr. Gala Romney and patient today. Plan to pursue EGD today. Previously discussed with daughter [name].   Laureen Ochs. Bernarda Caffey [facility] [phone] 8/26/20228:57 AM

## 2021-08-03 NOTE — Plan of Care (Signed)
Oriented with delayed responses. On room air. SBP < 190 met with no interventions needed. Urinated x 1. Ambulated to [facility] with no result. PRN pain medications given for abd pain. No n/v. NPO since midnight for EGD today.  Problem: Health Behavior/Discharge Planning: Goal: Ability to manage health-related needs will improve Outcome: Progressing   Problem: Clinical Measurements: Goal: Will remain free from infection Outcome: Progressing   Problem: Activity: Goal: Risk for activity intolerance will decrease Outcome: Progressing

## 2021-08-03 NOTE — TOC Initial Note (Signed)
Transition of Care [facility]) - Initial/Assessment Note    Patient Details  Name: Melissa Lopez MRN: [id number] Date of Birth: [birth year]  [facility] CM/SW Contact:    Ihor Gully, LCSW Phone Number: 08/03/2021, 3:03 PM  Clinical Narrative:                 Patient from home with daughter and granddaughter. Active with [facility]. Attends [employer] three times per week. If she has to come to the [facility] facility in [place], [place] transports her. Patient active with [facility] since 01/09/21. Estill Bamberg [phone] is [facility] Social worker, advised that she will discharge on Sunday likely. If patient's daughter cannot transport this weekend, call [facility] line at [phone].  Expected Discharge Plan: Home/Self Care Barriers to Discharge: Continued Medical Work up   Patient Goals and CMS Choice        Expected Discharge Plan and Services Expected Discharge Plan: Home/Self Care     Post Acute Care Choice: Resumption of Svcs/PTA Provider Living arrangements for the past 2 months: Single Family Home                                      Prior Living Arrangements/Services Living arrangements for the past 2 months: Single Family Home Lives with:: Adult Children Patient language and need for interpreter reviewed:: Yes Do you feel safe going back to the place where you live?: Yes      Need for Family Participation in Patient Care: Yes (Comment) Care giver support system in place?: Yes (comment)   Criminal Activity/Legal Involvement Pertinent to Current Situation/Hospitalization: No - Comment as needed  Activities of Daily Living Home Assistive Devices/Equipment: Gilford Rile (specify type) ADL Screening (condition at time of admission) Patient's cognitive ability adequate to safely complete daily activities?: Yes Is the patient deaf or have difficulty hearing?: No Does the patient have difficulty seeing, even when wearing glasses/contacts?: No Does the patient have difficulty  concentrating, remembering, or making decisions?: No Patient able to express need for assistance with ADLs?: Yes Does the patient have difficulty dressing or bathing?: No Independently performs ADLs?: Yes (appropriate for developmental age) Does the patient have difficulty walking or climbing stairs?: Yes Weakness of Legs: Both Weakness of Arms/Hands: None  Permission Sought/Granted Permission sought to share information with : Other (comment)    Share Information with NAME: Estill Bamberg, [facility] social worker           Emotional Assessment       Orientation: : Oriented to Self, Oriented to Place, Oriented to  Time, Oriented to Situation Alcohol / Substance Use: Not Applicable Psych Involvement: No (comment)  Admission diagnosis:  Hematemesis [K92.0] Weakness [R53.1] Patient Active Problem List   Diagnosis Date Noted   Acute renal failure superimposed on stage 3b chronic kidney disease ([facility]) 08/02/2021   Hematemesis 08/01/2021   Orthostasis    Seizure-like activity ([place]) 06/13/2021   Loss of weight    Primary hypertension    Chest pain of uncertain etiology 14/43/1540   Primary hyperparathyroidism ([place]) 09/25/2020   Emesis    Hypophosphatemia    Hypokalemia 09/22/2020   Chronic diastolic CHF (congestive heart failure) ([place]) 09/22/2020   Noninfective gastroenteritis and colitis, unspecified 04/13/2020   Unilateral primary osteoarthritis, right knee 04/13/2020   Arthritis 04/13/2020   Bradycardia 04/13/2020   Cardiomyopathy, 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[place]) 04/13/2020   Class 1 obesity due to excess calories with body mass index (BMI)  of 34.0 to 34.9 in adult 04/13/2020   Acute kidney failure, unspecified ([place]) 04/29/2019   Intractable nausea and vomiting 11/10/2018   Malnutrition of moderate degree 11/09/2018   AKI (acute kidney injury) ([place])    PE (pulmonary thromboembolism) ([place])    H/O cardiac pacemaker 10/31/2018   Syncope 10/31/2018   ST elevation on EKG without MI 10/31/2018    Chest pain with high risk of acute coronary syndrome 10/31/2018   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[place]) 10/31/2018   Type 2 diabetes mellitus with complication, with long-term current use of insulin ([place]) 10/31/2018   CKD (chronic kidney disease) stage 3, GFR 30-59 ml/min (HCC) - per patient report 10/31/2018   Presence of cardiac pacemaker 10/31/2018   Essential hypertension    Hypercalcemia 07/27/2015   PCP:  Inc, [place]:   [facility] [place], [place] AT [place] [address] [place] [address] Phone: [phone] Fax: [phone]     Social Determinants of Health (SDOH) Interventions    Readmission Risk Interventions Readmission Risk Prevention Plan 11/17/2020  Transportation Screening Complete  PCP or Specialist Appt within 3-5 Days Complete  HRI or Home Care Consult Complete  Social Work Consult for [place] Planning/Counseling Complete  Palliative Care Screening Not Applicable  Medication Review Press photographer) Complete  Some recent data might be hidden

## 2021-08-03 NOTE — Anesthesia Postprocedure Evaluation (Signed)
Anesthesia Post Note  Patient: Melissa Lopez  Procedure(s) Performed: ESOPHAGOGASTRODUODENOSCOPY (EGD) WITH PROPOFOL BIOPSY  Patient location during evaluation: PACU Anesthesia Type: General Level of consciousness: awake and alert and oriented Pain management: pain level controlled Vital Signs Assessment: post-procedure vital signs reviewed and stable Respiratory status: spontaneous breathing and respiratory function stable Cardiovascular status: blood pressure returned to baseline and stable Postop Assessment: no apparent nausea or vomiting Anesthetic complications: no   No notable events documented.   Last Vitals:  Vitals:   08/03/21 1520 08/03/21 1603  BP: (!) 157/88 (!) 181/88  Pulse:  70  Resp: 20   Temp: 36.9 C   SpO2: 99% 100%    Last Pain:  Vitals:   08/03/21 1520  TempSrc:   PainSc: Asleep                 Adarrius Graeff C Abbigayle Toole

## 2021-08-03 NOTE — Interval H&P Note (Signed)
History and Physical Interval Note:  08/03/2021 2:58 PM  Melissa Lopez  has presented today for surgery, with the diagnosis of hematemesis, chronic N/V, epigastric pain, dysphagia.  The various methods of treatment have been discussed with the patient and family. After consideration of risks, benefits and other options for treatment, the patient has consented to  Procedure(s): ESOPHAGOGASTRODUODENOSCOPY (EGD) WITH PROPOFOL (N/A) ESOPHAGEAL DILATION (N/A) as a surgical intervention.  The patient's history has been reviewed, patient examined, no change in status, stable for surgery.  I have reviewed the patient's chart and labs.  Questions were answered to the patient's satisfaction.     Manus Rudd   Seen and examined in short stay.  Hemoglobin remains normal at 12.6. Patient has remained stable overnight.  Agree with plans proceed with EGD. The risks, benefits, limitations, alternatives and imponderables have been reviewed with the patient. Potential for esophageal dilation, biopsy, etc. have also been reviewed.  Questions have been answered. All parties agreeable.

## 2021-08-04 DIAGNOSIS — K920 Hematemesis: Secondary | ICD-10-CM | POA: Diagnosis not present

## 2021-08-04 DIAGNOSIS — R1310 Dysphagia, unspecified: Secondary | ICD-10-CM | POA: Diagnosis not present

## 2021-08-04 LAB — CBC
HCT: 39.3 % (ref 36.0–46.0)
Hemoglobin: 12.9 g/dL (ref 12.0–15.0)
MCH: 31.5 pg (ref 26.0–34.0)
MCHC: 32.8 g/dL (ref 30.0–36.0)
MCV: 95.9 fL (ref 80.0–100.0)
Platelets: 195 10*3/uL (ref 150–400)
RBC: 4.1 MIL/uL (ref 3.87–5.11)
RDW: 13.4 % (ref 11.5–15.5)
WBC: 17.7 10*3/uL — ABNORMAL HIGH (ref 4.0–10.5)
nRBC: 0 % (ref 0.0–0.2)

## 2021-08-04 LAB — COMPREHENSIVE METABOLIC PANEL
ALT: 53 U/L — ABNORMAL HIGH (ref 0–44)
AST: 37 U/L (ref 15–41)
Albumin: 3.2 g/dL — ABNORMAL LOW (ref 3.5–5.0)
Alkaline Phosphatase: 71 U/L (ref 38–126)
Anion gap: 8 (ref 5–15)
BUN: 41 mg/dL — ABNORMAL HIGH (ref 8–23)
CO2: 25 mmol/L (ref 22–32)
Calcium: 11.7 mg/dL — ABNORMAL HIGH (ref 8.9–10.3)
Chloride: 114 mmol/L — ABNORMAL HIGH (ref 98–111)
Creatinine, Ser: 1.76 mg/dL — ABNORMAL HIGH (ref 0.44–1.00)
GFR, Estimated: 31 mL/min — ABNORMAL LOW (ref >60)
Glucose, Bld: 133 mg/dL — ABNORMAL HIGH (ref 70–99)
Potassium: 3.3 mmol/L — ABNORMAL LOW (ref 3.5–5.1)
Sodium: 147 mmol/L — ABNORMAL HIGH (ref 135–145)
Total Bilirubin: 0.9 mg/dL (ref 0.3–1.2)
Total Protein: 6.2 g/dL — ABNORMAL LOW (ref 6.5–8.1)

## 2021-08-04 LAB — URINE CULTURE: Culture: <10000 — AB

## 2021-08-04 LAB — MAGNESIUM: Magnesium: 2.2 mg/dL (ref 1.7–2.4)

## 2021-08-04 MED ORDER — CARVEDILOL 3.125 MG PO TABS
6.2500 mg | ORAL_TABLET | Freq: Two times a day (BID) | ORAL | Status: DC
  Administered 2021-08-04 – 2021-08-05 (×2): 6.25 mg via ORAL
  Filled 2021-08-04 (×2): qty 2

## 2021-08-04 MED ORDER — HYDROCODONE-ACETAMINOPHEN 5-325 MG PO TABS
1.0000 | ORAL_TABLET | Freq: Once | ORAL | Status: AC
  Administered 2021-08-04: 1 via ORAL
  Filled 2021-08-04: qty 1

## 2021-08-04 MED ORDER — POTASSIUM CHLORIDE CRYS ER 20 MEQ PO TBCR
20.0000 meq | EXTENDED_RELEASE_TABLET | Freq: Once | ORAL | Status: AC
  Administered 2021-08-04: 20 meq via ORAL
  Filled 2021-08-04: qty 1

## 2021-08-04 MED ORDER — KCL IN DEXTROSE-NACL 20-5-0.45 MEQ/L-%-% IV SOLN: INTRAVENOUS | Status: DC

## 2021-08-04 NOTE — Progress Notes (Signed)
PROGRESS NOTE  Melissa Lopez BZJ:[id number] DOB: [birth year] DOA: 08/01/2021 PCP: [facility]  Brief History:  70 year old female with a history of CKD stage III, coronary artery disease, sick sinus syndrome status post permanent pacemaker, primary hyperparathyroidism, diabetes mellitus with gastroparesis, and hypertension presenting with nausea and vomiting that began on 07/27/2021.  The patient has had vomiting on a daily basis.  However the patient's daughter noted that she began having hematemesis on the morning of 08/01/2021.  She described it as a 1-1/2 cups of blood.  As result, the patient was brought to the hospital for further evaluation.  The patient has not had any fevers, chills, chest pain, shortness of breath, palpitations, syncope.  She has not had any diarrhea, hematochezia, melena.  In fact, the patient has trouble with constipation.  There is not been any new medications.  The patient has not had any recent travels or eaten any undercooked or exotic foods.  She has not had any sick contacts.  Patient has been complaining of intermittent abdominal pain.  She has not been taking any NSAIDs. In the emergency department, the patient was afebrile and hemodynamically stable with oxygen saturation 96/97% on room air.  CT of the abdomen and pelvis showed stable CBD dilatation.  There were innumerable bilateral renal cysts without hydronephrosis.  There is moderate stool in the colon.  WBC was 27.9 with hemoglobin 16.0 and platelets 170,000.  BMP showed serum creatinine 2.46.  The patient was started on IV fluids.  GI was consulted to assist with management.   Notably, the patient had a recent hospital admission from 06/12/2021 to 06/18/2021 at which time she was treated for hypercalcemia, AKI, and shaking spells.  Her hypercalcemia improved with IV fluids.  It was felt this was due to primary hyperparathyroidism.  She was seen by neurology and felt that  shaking spells were due to her primary metabolic abnormalities.  EEG was negative.  She was discharged with instructions to follow-up with surgery for possible parathyroidectomy.  However, the patient's daughter states that they have been following with physician staff at pace he did not feel the patient was a good surgical candidate.  The patient was subsequently continued on Cinacalcet.   Assessment/Plan: Hematemesis/Intractable vomiting -Suspect Mallory-Weiss tear -multifactorial hypercalcemia in setting of DM gastroparesis and cannabis use -Appreciate GI consult>>planning EGD 8/26 -Patient has had numerous EGDs in the past for nausea and vomiting -October 2021 EGD--normal esophagus, erythematous mucosa in the antrum, normal duodenum.  Presence of bile reflux suggest gastric dysmotility or bile reflux gastritis--biopsies were negative for H. Pylori -Metoclopramide was discontinued last hospitalization was due to concerns for tardive dyskinesia -continue PPI bid -continue IVF -restart miralax and senna -AM cortisol 24.2 -reglan IV added   Hypercalcemia/primary hyperparathyroidism -Corrected Ca = 12.0 at time of admission -8/26--Corrected Ca = 11.9 -restart IVF -hypercalemia also exacerbating  emesis -will ultimately need surgical eval -change fluid to D51/2NS and increase rate   Acute on Chronic renal failure--CKD 3 b -baseline creatinine 1.4-1.6 -presented with serum creatinine 2.46 -due to volume depletion -continue IVF   Pyuria -UA = 11-20 WBC -follow urine culture>>less than 10K organisms -discontinue empiric ceftriaxone   Acute metabolic Encephalopathy -multifactorial including hypercalcemia, dehydration, AKI, possible UTI, hospital delirium -8/27--back to baseline   Tachybradysyndrome s/p PPM -follows Dr. Lovena Le   Hypertrophic Cardiomyopathy/elevated Troponin -8/24 limited echo EF 65-70; apical HCM -appreciate Dr. Harl Bowie input--has chronic ST elevation likely  related to her apical hypertrophy -has had 2 prior catheterizations in the setting of vomiting with similar ST elevations-2019, 2021, both negative for significant disease. -Trop 140 > 109   Hypertension -patient needs higher systolic blood pressure goal to reduce her risk of falls and syncope.  Patient's amlodipine and hydralazine were discontinued. -she was discharged with coreg and amlodipine in July 2022 but appears amlodipine d/ced since then -prn hydralazine Likely she will need a higher systolic BP goal of <716 due to her symptomatic hypotension -increase coreg to 6.25 mg bid   DM2 with gastroparesis and orthostasis/dysautonomia -no longer on meds per daughter -08/02/21 A1C--5.5 -d/c sliding scale   Hypokalemia -replete -mag 2.4 -add KCl to IVF   Prior CVA R Thalamic lacunar stroke Residual left sided numbness per most recent Neurology note (Dr. Andrey Spearman). Patient was continued on home aspirin 81mg .    Chronic diastolic CHF -9/67 echo EF 65-70%, G1DD -clinically hypovolemic presently       Status is: Inpatient   Remains inpatient appropriate because:Inpatient level of care appropriate due to severity of illness   Dispo: The patient is from: Home              Anticipated d/c is to: Home              Patient currently is not medically stable to d/c.              Difficult to place patient No               Family Communication:   daughter updated 8/27   Consultants:  GI   Code Status:  FULL    DVT Prophylaxis:  [place] Heparin     Procedures: As Listed in Progress Note Above   Antibiotics: None     Subjective: Patient complains of constipation.  Denies f/c headache, cp, sob, n/v/d, abd pain  Objective: Vitals:   08/03/21 1711 08/03/21 2029 08/04/21 0550 08/04/21 1341  BP: (!) 191/93 (!) 160/72 (!) 200/101 (!) 179/96  Pulse: 63 62  (!) 59  Resp: 16 18  16   Temp: 97.8 F (36.6 C) 98.2 F (36.8 C) 98.7 F (37.1 C) 98.2 F (36.8 C)   TempSrc:  Oral Oral Oral  SpO2: 100% 100%  100%  Weight:      Height:        Intake/Output Summary (Last 24 hours) at 08/04/2021 1644 Last data filed at 08/04/2021 1300 Gross per 24 hour  Intake 1139.36 ml  Output --  Net 1139.36 ml   Weight change:  Exam:  General:  Pt is alert, follows commands appropriately, not in acute distress HEENT: No icterus, No thrush, No neck mass, [place]/AT Cardiovascular: RRR, S1/S2, no rubs, no gallops Respiratory:bibasilar crackles. No wheeze Abdomen: Soft/+BS, non tender, non distended, no guarding Extremities: No edema, No lymphangitis, No petechiae, No rashes, no synovitis   Data Reviewed: I have personally reviewed following labs and imaging studies Basic Metabolic Panel: Recent Labs  Lab 08/01/21 1254 08/01/21 1304 08/01/21 1728 08/02/21 0500 08/03/21 0502 08/04/21 0448  NA 136 139  --  141 146* 147*  K 2.7* 2.8*  --  3.0* 3.4* 3.3*  CL 99 103  --  109 113* 114*  CO2 26  --   --  24 26 25   GLUCOSE 147* 148*  --  129* 120* 133*  BUN 61* 55*  --  49* 48* 41*  CREATININE 2.46* 2.50*  --  2.04* 2.09* 1.76*  CALCIUM  12.0*  --   --  11.1* 11.6* 11.7*  MG  --   --  2.4  --   --  2.2   Liver Function Tests: Recent Labs  Lab 08/01/21 1254 08/03/21 0502 08/04/21 0448  AST 25 31 37  ALT 24 36 53*  ALKPHOS 85 69 71  BILITOT 1.2 0.9 0.9  PROT 8.6* 6.2* 6.2*  ALBUMIN 4.6 3.3* 3.2*   No results for input(s): LIPASE, AMYLASE in the last 168 hours. No results for input(s): AMMONIA in the last 168 hours. Coagulation Profile: No results for input(s): INR, PROTIME in the last 168 hours. CBC: Recent Labs  Lab 08/01/21 1254 08/01/21 1304 08/01/21 1929 08/02/21 0500 08/03/21 0502 08/04/21 0448  WBC 27.9*  --  23.7* 20.6* 19.1* 17.7*  NEUTROABS 23.3*  --   --   --   --   --   HGB 16.0* 16.0* 13.5 13.7 12.6 12.9  HCT 46.5* 47.0* 40.0 40.4 38.8 39.3  MCV 93.0  --  93.7 94.6 97.5 95.9  PLT 227  --  169 170 175 195   Cardiac  Enzymes: No results for input(s): CKTOTAL, CKMB, CKMBINDEX, TROPONINI in the last 168 hours. BNP: Invalid input(s): POCBNP CBG: Recent Labs  Lab 08/01/21 2244 08/02/21 0541 08/02/21 1112  GLUCAP 125* 114* 118*   HbA1C: Recent Labs    08/02/21 0500  HGBA1C 5.5   Urine analysis:    Component Value Date/Time   COLORURINE YELLOW 08/02/2021 1650   APPEARANCEUR HAZY (A) 08/02/2021 1650   LABSPEC 1.013 08/02/2021 1650   PHURINE 7.0 08/02/2021 1650   GLUCOSEU 50 (A) 08/02/2021 1650   HGBUR NEGATIVE 08/02/2021 1650   BILIRUBINUR NEGATIVE 08/02/2021 1650   KETONESUR NEGATIVE 08/02/2021 1650   PROTEINUR 100 (A) 08/02/2021 1650   NITRITE NEGATIVE 08/02/2021 1650   LEUKOCYTESUR SMALL (A) 08/02/2021 1650   Sepsis Labs: @LABRCNTIP (procalcitonin:4,lacticidven:4) ) Recent Results (from the past 240 hour(s))  Resp Panel by RT-PCR (Flu A&B, Covid) Nasopharyngeal Swab     Status: None   Collection Time: 08/01/21  2:17 PM   Specimen: Nasopharyngeal Swab; Nasopharyngeal(NP) swabs in vial transport medium  Result Value Ref Range Status   SARS Coronavirus 2 by RT PCR NEGATIVE NEGATIVE Final    Comment: (NOTE) SARS-CoV-2 target nucleic acids are NOT DETECTED.  The SARS-CoV-2 RNA is generally detectable in upper respiratory specimens during the acute phase of infection. The lowest concentration of SARS-CoV-2 viral copies this assay can detect is 138 copies/mL. A negative result does not preclude SARS-Cov-2 infection and should not be used as the sole basis for treatment or other patient management decisions. A negative result may occur with  improper specimen collection/handling, submission of specimen other than nasopharyngeal swab, presence of viral mutation(s) within the areas targeted by this assay, and inadequate number of viral copies(<138 copies/mL). A negative result must be combined with clinical observations, patient history, and epidemiological information. The expected  result is Negative.  Fact Sheet for Patients:  EntrepreneurPulse.com.au  Fact Sheet for Healthcare Providers:  IncredibleEmployment.be  This test is no t yet approved or cleared by the [place] FDA and  has been authorized for detection and/or diagnosis of SARS-CoV-2 by FDA under an Emergency Use Authorization (EUA). This EUA will remain  in effect (meaning this test can be used) for the duration of the COVID-19 declaration under Section 564(b)(1) of the Act, 21 U.S.C.section 360bbb-3(b)(1), unless the authorization is terminated  or revoked sooner.       Influenza  A by PCR NEGATIVE NEGATIVE Final   Influenza B by PCR NEGATIVE NEGATIVE Final    Comment: (NOTE) The Xpert Xpress SARS-CoV-2/FLU/RSV plus assay is intended as an aid in the diagnosis of influenza from Nasopharyngeal swab specimens and should not be used as a sole basis for treatment. Nasal washings and aspirates are unacceptable for Xpert Xpress SARS-CoV-2/FLU/RSV testing.  Fact Sheet for Patients: EntrepreneurPulse.com.au  Fact Sheet for Healthcare Providers: IncredibleEmployment.be  This test is not yet approved or cleared by the [place] FDA and has been authorized for detection and/or diagnosis of SARS-CoV-2 by FDA under an Emergency Use Authorization (EUA). This EUA will remain in effect (meaning this test can be used) for the duration of the COVID-19 declaration under Section 564(b)(1) of the Act, 21 U.S.C. section 360bbb-3(b)(1), unless the authorization is terminated or revoked.  Performed at [facility], [address], [place], [place] [address]   MRSA Next Gen by PCR, Nasal     Status: None   Collection Time: 08/01/21 10:11 PM   Specimen: Nasal Mucosa; Nasal Swab  Result Value Ref Range Status   MRSA by PCR Next Gen NOT DETECTED NOT DETECTED Final    Comment: (NOTE) The GeneXpert MRSA Assay (FDA approved for NASAL  specimens only), is one component of a comprehensive MRSA colonization surveillance program. It is not intended to diagnose MRSA infection nor to guide or monitor treatment for MRSA infections. Test performance is not FDA approved in patients less than 55 years old. Performed at [facility], [address], [place], [place] [address]   Urine Culture     Status: Abnormal   Collection Time: 08/02/21  4:50 PM   Specimen: Urine, Clean Catch  Result Value Ref Range Status   Specimen Description   Final    URINE, CLEAN CATCH Performed at [facility], [address], [place], [place] [address]    Special Requests   Final    NONE Performed at [facility], [address] [place], [place] [address]    Culture (A)  Final    <10,000 COLONIES/mL INSIGNIFICANT GROWTH Performed at [facility], [place] [address] [place], [place] [address]    Report Status 08/04/2021 FINAL  Final     Scheduled Meds:  carvedilol  6.25 mg Oral BID WC   Chlorhexidine Gluconate Cloth  6 each Topical Daily   linaclotide  145 mcg Oral QAC breakfast   metoCLOPramide (REGLAN) injection  5 mg Intravenous Q6H   pantoprazole (PROTONIX) IV  40 mg Intravenous Q12H   polyethylene glycol  17 g Oral BID   potassium chloride  20 mEq Oral Once   senna  2 tablet Oral Daily   Continuous Infusions:  cefTRIAXone (ROCEPHIN)  IV 1 g (08/04/21 1035)   dextrose 5 % and 0.45 % NaCl with KCl 20 mEq/L 150 mL/hr at 08/04/21 1033   promethazine (PHENERGAN) injection (IM or IVPB)      Procedures/Studies: CT ABDOMEN PELVIS WO CONTRAST  Result Date: 08/01/2021 CLINICAL DATA:  Abdominal pain, nausea and vomiting for 3 days EXAM: CT ABDOMEN AND PELVIS WITHOUT CONTRAST TECHNIQUE: Multidetector CT imaging of the abdomen and pelvis was performed following the standard protocol without IV contrast. COMPARISON:  07/31/2021, 06/13/2021, 11/09/2020 FINDINGS: Lower chest: No acute pleural or parenchymal lung disease. Stable pacemaker.  Unenhanced CT was performed per clinician order. Lack of IV contrast limits sensitivity and specificity, especially for evaluation of abdominal/pelvic solid viscera. Hepatobiliary: No focal liver abnormality is seen. Status post cholecystectomy.  Stable postsurgical dilatation of the common bile duct. Pancreas: Unremarkable. No pancreatic ductal dilatation or surrounding inflammatory changes. Spleen: Normal in size without focal abnormality. Adrenals/Urinary Tract: Innumerable bilateral renal cortical cysts are identified, some displaying thin peripheral calcifications. Evaluation is incomplete on unenhanced exam, but there is no gross change since the preceding study. No urinary tract calculi or obstructive uropathy. The adrenals and bladder are stable. Stomach/Bowel: No bowel obstruction or ileus. Moderate retained stool throughout the colon. The appendix, if still present, is not well visualized. No bowel wall thickening or inflammatory change. Vascular/Lymphatic: Aortic atherosclerosis. No enlarged abdominal or pelvic lymph nodes. Reproductive: Status post hysterectomy. No adnexal masses. Other: No free fluid or free gas.  No abdominal wall hernia. Musculoskeletal: There are no acute or destructive bony lesions. Stable degenerative changes of the lumbar spine and bilateral hips. Reconstructed images demonstrate no additional findings. IMPRESSION: 1. No acute intra-abdominal or intrapelvic process. 2. Stable mildly complex bilateral renal cysts, incompletely evaluated by unenhanced exam. 3. No bowel obstruction or ileus. Moderate residual stool throughout the colon. 4.  Aortic Atherosclerosis (ICD10-I70.0). Electronically Signed   By: Randa Ngo M.D.   On: 08/01/2021 17:01   DG Abd 1 View  Result Date: 08/01/2021 CLINICAL DATA:  Abdominal pain, nausea, constipation EXAM: ABDOMEN - 1 VIEW COMPARISON:  KUB 06/14/2021 FINDINGS: There is a nonobstructive bowel gas pattern. There is a moderate colonic stool  burden projecting over the rectum and right hemiabdomen. There is no gross organomegaly or abnormal soft tissue calcification. There is no acute osseous abnormality. IMPRESSION: Moderate colonic stool burden as above without evidence of mechanical obstruction. Electronically Signed   By: Valetta Mole M.D.   On: 08/01/2021 13:39   DG Chest Port 1 View  Result Date: 08/01/2021 CLINICAL DATA:  Weakness.  Abdominal pain.  Nausea and vomiting. EXAM: PORTABLE CHEST 1 VIEW COMPARISON:  CT chest 06/13/2021 FINDINGS: Heart size is normal. Lungs are clear. No focal nodule, mass, or airspace disease is present. No edema or effusion is present. Pacemaker wires are stable. IMPRESSION: No acute cardiopulmonary disease or significant interval change. Electronically Signed   By: San Morelle M.D.   On: 08/01/2021 15:08   CUP PACEART REMOTE DEVICE CHECK  Result Date: 07/12/2021 Scheduled remote reviewed. Normal device function.  Next remote in 91 days. Kathy Breach, RN, CCDS, [employer]  ECHOCARDIOGRAM LIMITED  Result Date: 08/01/2021    ECHOCARDIOGRAM LIMITED REPORT   Patient Name:   ERIANNA JOLLY Date of Exam: 08/01/2021 Medical Rec #:  [id number]        Height:       64.0 in Accession #:    [id number]       Weight:       144.0 lb Date of Birth:  1951-01-04        BSA:          1.701 m Patient Age:    47 years         BP:           188/104 mmHg Patient Gender: F                HR:           68 bpm. Exam Location:  [facility] Procedure: 2D Echo, Cardiac Doppler and Color Doppler Indications:    Abnormal ECG R94.31  History:        Patient has prior history of Echocardiogram examinations, most  recent 06/13/2021. Hypertrophic Cardiomyopathy, CAD and STEMI (ST                 elevation myocardial infarction), Pacemaker; Risk                 Factors:Hypertension and Diabetes. Sick sinus syndrome (HCC)                 (From Hx).  Sonographer:    Alvino Chapel RCS Referring Phys: [id number]  [place]  1. There is moderate asymmsetric septal hypertrophy. The apex is not completely visualized but appears to have severe apical hypertrophy with systolic apical obliteration but no significant intracavitary gradient. Findings consistent with apical hypertrophic cardiomyopathy. . Left ventricular ejection fraction, by estimation, is 65 to 70%. The left ventricle has normal function. The left ventricle has no regional wall motion abnormalities. Left ventricular diastolic parameters are consistent with Grade I diastolic dysfunction (impaired relaxation).  2. Right ventricular systolic function is normal. The right ventricular size is normal.  3. Limited study to evaluate LV function FINDINGS  Left Ventricle: There is moderate asymmsetric septal hypertrophy. The apex is not completely visualized but appears to have severe apical hypertrophy with systolic apical obliteration but no significant intracavitary gradient. Findings consistent with apical hypertrophic cardiomyopathy. Left ventricular ejection fraction, by estimation, is 65 to 70%. The left ventricle has normal function. The left ventricle has no regional wall motion abnormalities. Left ventricular diastolic parameters are consistent with Grade I diastolic dysfunction (impaired relaxation). Right Ventricle: The right ventricular size is normal. No increase in right ventricular wall thickness. Right ventricular systolic function is normal. LEFT VENTRICLE PLAX 2D LVIDd:         4.07 cm  Diastology LVIDs:         2.71 cm  LV e' lateral:   4.82 cm/s LV PW:         1.09 cm  LV E/e' lateral: 10.6 LV IVS:        1.46 cm LVOT diam:     2.30 cm LV SV:         86 LV SV Index:   51 LVOT Area:     4.15 cm  RIGHT VENTRICLE RV S prime:     12.90 cm/s TAPSE (M-mode): 1.5 cm LEFT ATRIUM           Index       RIGHT ATRIUM           Index LA diam:      3.20 cm 1.88 cm/m  RA Area:     11.50 cm LA Vol (A4C): 29.9 ml 17.58 ml/m RA Volume:   26.20 ml   15.40 ml/m  AORTIC VALVE LVOT Vmax:   120.00 cm/s LVOT Vmean:  76.100 cm/s LVOT VTI:    0.208 m  AORTA Ao Root diam: 3.60 cm MITRAL VALVE MV Area (PHT): 3.15 cm     SHUNTS MV Decel Time: 241 msec     Systemic VTI:  0.21 m MV E velocity: 51.00 cm/s   Systemic Diam: 2.30 cm MV A velocity: 115.00 cm/s MV E/A ratio:  0.44 Carlyle Dolly MD Electronically signed by Carlyle Dolly MD Signature Date/Time: 08/01/2021/3:51:22 PM    Final     Orson Eva, DO  [facility]  If 7PM-7AM, please contact night-coverage [email or web address] Password [email or web address] 08/04/2021, 4:44 PM   LOS: 3 days

## 2021-08-04 NOTE — Progress Notes (Signed)
Pt stated she would like an enema. Awaiting order from on call. Will continue to monitor.Marland Kitchen

## 2021-08-04 NOTE — Progress Notes (Signed)
Pt stated she has pain in her stomach and in her hip on a scale stated of 8. She received  HYDROcodone-acetaminophen (NORCO/VICODIN) 5-325 MG per tablet 1 tablet    @2130 . Pt laying in bed, with eyes closed. On call notified. Will continue to monitor. Awaiting orders.

## 2021-08-04 NOTE — Progress Notes (Signed)
Only complaint this morning is no BM.  Nursing staff's states she had some juice and a little bit of grits this morning for breakfast.  Denies abdominal pain; no nausea or vomiting.  Bowel regimen includes senna, MiraLAX and Linzess (145)  Reglan 5 mg IV before meals and at bedtime initiated yesterday Vital signs in last 24 hours: Temp:  [97.8 F (36.6 C)-98.7 F (37.1 C)] 98.7 F (37.1 C) (08/27 0550) Pulse Rate:  [62-81] 62 (08/26 2029) Resp:  [16-20] 18 (08/26 2029) BP: (138-200)/(72-106) 200/101 (08/27 0550) SpO2:  [96 %-100 %] 100 % (08/26 2029) Weight:  [58.2 kg] 58.2 kg (08/26 1252)   General:   Alert,   pleasant and cooperative in NAD Abdomen: Nondistended.  Positive bowel sounds.  Soft and nontender.  No succussion splash. Rectal (performed by me): No external lesions.  Good sphincter tone.  No mass in the rectal vault; rectum is empty.   Intake/Output from previous day: 08/26 0701 - 08/27 0700 In: 1459.4 [P.O.:120; I.V.:1242.6; IV Piggyback:96.8] Out: 225 [Urine:225] Intake/Output this shift: No intake/output data recorded.  Lab Results: Recent Labs    08/02/21 0500 08/03/21 0502 08/04/21 0448  WBC 20.6* 19.1* 17.7*  HGB 13.7 12.6 12.9  HCT 40.4 38.8 39.3  PLT 170 175 195   BMET Recent Labs    08/02/21 0500 08/03/21 0502 08/04/21 0448  NA 141 146* 147*  K 3.0* 3.4* 3.3*  CL 109 113* 114*  CO2 24 26 25   GLUCOSE 129* 120* 133*  BUN 49* 48* 41*  CREATININE 2.04* 2.09* 1.76*  CALCIUM 11.1* 11.6* 11.7*   LFT Recent Labs    08/04/21 0448  PROT 6.2*  ALBUMIN 3.2*  AST 37  ALT 53*  ALKPHOS 71  BILITOT 0.9     Impression: Debilitated 70 year old lady admitted to the hospital with hematemesis and acute renal failure.  Severe exudative ulcerative reflux esophagitis found on EGD yesterday with other findings suspicious for delayed gastric emptying.  Reglan previously discontinued but it was resumed yesterday. Hemoglobin stable.  No further bleeding.   Renal function improved.  Suspect delayed gastric emptying-multifactorial in etiology, as the primary precipitating factor of her acute GI illness.   Minimal elevation in serum ALT-nonspecific.  Recommendations:  Low-fat, low residue diet  Continue twice daily PPI.  Continue Reglan 5 mg IV before meals and at bedtime  Continue her current bowel regimen (Linzess, senna, MiraLAX); hopefully, can streamline further depending on clinical progress.  Follow-up on esophageal biopsies.  No more THC.  Recheck LFTs next week

## 2021-08-05 LAB — CBC
HCT: 37.1 % (ref 36.0–46.0)
Hemoglobin: 12.2 g/dL (ref 12.0–15.0)
MCH: 31.9 pg (ref 26.0–34.0)
MCHC: 32.9 g/dL (ref 30.0–36.0)
MCV: 97.1 fL (ref 80.0–100.0)
Platelets: 194 10*3/uL (ref 150–400)
RBC: 3.82 MIL/uL — ABNORMAL LOW (ref 3.87–5.11)
RDW: 13.5 % (ref 11.5–15.5)
WBC: 15.9 10*3/uL — ABNORMAL HIGH (ref 4.0–10.5)
nRBC: 0 % (ref 0.0–0.2)

## 2021-08-05 LAB — COMPREHENSIVE METABOLIC PANEL
ALT: 53 U/L — ABNORMAL HIGH (ref 0–44)
AST: 31 U/L (ref 15–41)
Albumin: 3.1 g/dL — ABNORMAL LOW (ref 3.5–5.0)
Alkaline Phosphatase: 68 U/L (ref 38–126)
Anion gap: 5 (ref 5–15)
BUN: 26 mg/dL — ABNORMAL HIGH (ref 8–23)
CO2: 25 mmol/L (ref 22–32)
Calcium: 11.3 mg/dL — ABNORMAL HIGH (ref 8.9–10.3)
Chloride: 111 mmol/L (ref 98–111)
Creatinine, Ser: 1.55 mg/dL — ABNORMAL HIGH (ref 0.44–1.00)
GFR, Estimated: 36 mL/min — ABNORMAL LOW (ref >60)
Glucose, Bld: 169 mg/dL — ABNORMAL HIGH (ref 70–99)
Potassium: 4.3 mmol/L (ref 3.5–5.1)
Sodium: 141 mmol/L (ref 135–145)
Total Bilirubin: 0.7 mg/dL (ref 0.3–1.2)
Total Protein: 6.1 g/dL — ABNORMAL LOW (ref 6.5–8.1)

## 2021-08-05 LAB — TROPONIN I (HIGH SENSITIVITY): Troponin I (High Sensitivity): 48 ng/L — ABNORMAL HIGH (ref <18)

## 2021-08-05 MED ORDER — BISACODYL 10 MG RE SUPP
10.0000 mg | Freq: Once | RECTAL | Status: AC
  Administered 2021-08-05: 10 mg via RECTAL
  Filled 2021-08-05: qty 1

## 2021-08-05 MED ORDER — LIDOCAINE VISCOUS HCL 2 % MT SOLN
15.0000 mL | Freq: Once | OROMUCOSAL | Status: AC
  Administered 2021-08-05: 15 mL via ORAL
  Filled 2021-08-05: qty 15

## 2021-08-05 MED ORDER — MAGNESIUM SULFATE 2 GM/50ML IV SOLN
2.0000 g | Freq: Once | INTRAVENOUS | Status: AC
  Administered 2021-08-05: 2 g via INTRAVENOUS
  Filled 2021-08-05: qty 50

## 2021-08-05 MED ORDER — CARVEDILOL 3.125 MG PO TABS
9.3750 mg | ORAL_TABLET | Freq: Two times a day (BID) | ORAL | Status: DC
  Administered 2021-08-05 – 2021-08-07 (×4): 9.375 mg via ORAL
  Filled 2021-08-05 (×4): qty 3

## 2021-08-05 MED ORDER — SODIUM CHLORIDE 0.9 % IV SOLN
30.0000 mg | Freq: Once | INTRAVENOUS | Status: AC
  Administered 2021-08-05: 30 mg via INTRAVENOUS
  Filled 2021-08-05: qty 10

## 2021-08-05 MED ORDER — ALUM & MAG HYDROXIDE-SIMETH 200-200-20 MG/5ML PO SUSP
30.0000 mL | Freq: Once | ORAL | Status: AC
  Administered 2021-08-05: 30 mL via ORAL
  Filled 2021-08-05: qty 30

## 2021-08-05 NOTE — Progress Notes (Addendum)
PROGRESS NOTE  Melissa Lopez CWC:[id number] DOB: 12-07-1951 DOA: 08/01/2021 PCP: [facility] [place]      Brief History:  70 year old female with a history of CKD stage III, coronary artery disease, sick sinus syndrome status post permanent pacemaker, primary hyperparathyroidism, diabetes mellitus with gastroparesis, and hypertension presenting with nausea and vomiting that began on 07/27/2021.  The patient has had vomiting on a daily basis.  However the patient's daughter noted that she began having hematemesis on the morning of 08/01/2021.  She described it as a 1-1/2 cups of blood.  As result, the patient was brought to the hospital for further evaluation.  The patient has not had any fevers, chills, chest pain, shortness of breath, palpitations, syncope.  She has not had any diarrhea, hematochezia, melena.  In fact, the patient has trouble with constipation.  There is not been any new medications.  The patient has not had any recent travels or eaten any undercooked or exotic foods.  She has not had any sick contacts.  Patient has been complaining of intermittent abdominal pain.  She has not been taking any NSAIDs. In the emergency department, the patient was afebrile and hemodynamically stable with oxygen saturation 96/97% on room air.  CT of the abdomen and pelvis showed stable CBD dilatation.  There were innumerable bilateral renal cysts without hydronephrosis.  There is moderate stool in the colon.  WBC was 27.9 with hemoglobin 16.0 and platelets 170,000.  BMP showed serum creatinine 2.46.  The patient was started on IV fluids.  GI was consulted to assist with management.   Notably, the patient had a recent hospital admission from 06/12/2021 to 06/18/2021 at which time she was treated for hypercalcemia, AKI, and shaking spells.  Her hypercalcemia improved with IV fluids.  It was felt this was due to primary hyperparathyroidism.  She was seen by neurology and felt  that shaking spells were due to her primary metabolic abnormalities.  EEG was negative.  She was discharged with instructions to follow-up with surgery for possible parathyroidectomy.  However, the patient's daughter states that they have been following with physician staff at pace he did not feel the patient was a good surgical candidate.  The patient was subsequently continued on Cinacalcet.   Assessment/Plan: Hematemesis/Intractable vomiting -Suspect Mallory-Weiss tear -multifactorial hypercalcemia in setting of DM gastroparesis and cannabis use -Appreciate GI consult>>planning EGD 8/26 -Patient has had numerous EGDs in the past for nausea and vomiting -October 2021 EGD--normal esophagus, erythematous mucosa in the antrum, normal duodenum.  Presence of bile reflux suggest gastric dysmotility or bile reflux gastritis--biopsies were negative for H. Pylori -Metoclopramide was discontinued last hospitalization was due to concerns for tardive dyskinesia -08/03/21 EGD--severe reflux esophagitis; bile gastrities -continue PPI bid -continue IVF -restart miralax and senna -AM cortisol 24.2 -reglan IV added   Hypercalcemia/primary hyperparathyroidism -Corrected Ca = 12.0 at time of admission -8/26--Corrected Ca = 11.9 -restart IVF -hypercalemia also exacerbating  emesis -will ultimately need surgical eval -change fluid to D51/2NS and increase rate -given slow/poor response to IVF-->give pamidronate x 1   Acute on Chronic renal failure--CKD 3 b -baseline creatinine 1.4-1.6 -presented with serum creatinine 2.46 -due to volume depletion -continue IVF>>improving  Constipation -multifactorial including opioids, hypercalcemia, inactivity -Linzess increased to 125 -continue miralax and senna -may need try enema--defer to GI  Chest pain -related to severe reflux esophagitis -GI cocktail -personally reviewed EKG--sinus, nonspecific STT changes   Pyuria -UA = 11-20  WBC -follow urine  culture>>less than 10K organisms -discontinue empiric ceftriaxone   Acute metabolic Encephalopathy -multifactorial including hypercalcemia, dehydration, AKI, possible UTI, hospital delirium -8/27--back to baseline   Tachybradysyndrome s/p PPM -follows Dr. Lovena Le   Hypertrophic Cardiomyopathy/elevated Troponin -8/24 limited echo EF 65-70; apical HCM -appreciate Dr. Harl Bowie input--has chronic ST elevation likely related to her apical hypertrophy -has had 2 prior catheterizations in the setting of vomiting with similar ST elevations-2019, 2021, both negative for significant disease. -Trop 140 > 109   Hypertension -patient needs higher systolic blood pressure goal to reduce her risk of falls and syncope.  Patient's amlodipine and hydralazine were discontinued. -she was discharged with coreg and amlodipine in July 2022 but appears amlodipine d/ced since then -prn hydralazine Likely she will need a higher systolic BP goal of <841 due to her symptomatic hypotension -increase coreg to 9.375 mg bid   DM2 with gastroparesis and orthostasis/dysautonomia -no longer on meds per daughter -08/02/21 A1C--5.5 -d/c sliding scale   Hypokalemia -replete -mag 2.4 -add KCl to IVF   Prior CVA R Thalamic lacunar stroke Residual left sided numbness per most recent Neurology note (Dr. Andrey Spearman). Patient was continued on home aspirin 81mg .    Chronic diastolic CHF -6/60 echo EF 65-70%, G1DD -clinically hypovolemic presently       Status is: Inpatient   Remains inpatient appropriate because:Inpatient level of care appropriate due to severity of illness   Dispo: The patient is from: Home              Anticipated d/c is to: Home              Patient currently is not medically stable to d/c.              Difficult to place patient No               Family Communication:   daughter updated 8/28   Consultants:  GI   Code Status:  FULL    DVT Prophylaxis:  Blaine Heparin      Procedures: As Listed in Progress Note Above   Antibiotics: None Subjective: Patient complains of epigastric and lower sternal pain.  Denies f/c, sob, n/v/d.  Complains of constipation.  Objective: Vitals:   08/05/21 0456 08/05/21 0945 08/05/21 1154 08/05/21 1239  BP: (!) 195/98 (!) 195/106 (!) 159/93 (!) 178/95  Pulse: 60 64 60 79  Resp: 19   18  Temp: 98.6 F (37 C)   98 F (36.7 C)  TempSrc: Oral   Oral  SpO2: 100%  97% 100%  Weight:      Height:        Intake/Output Summary (Last 24 hours) at 08/05/2021 1325 Last data filed at 08/05/2021 1238 Gross per 24 hour  Intake 480 ml  Output 800 ml  Net -320 ml   Weight change:  Exam:  General:  Pt is alert, follows commands appropriately, not in acute distress HEENT: No icterus, No thrush, No neck mass, [place]/AT Cardiovascular: RRR, S1/S2, no rubs, no gallops Respiratory: bibasilar crackles. No wheeze Abdomen: Soft/+BS, non tender, non distended, no guarding Extremities: No edema, No lymphangitis, No petechiae, No rashes, no synovitis   Data Reviewed: I have personally reviewed following labs and imaging studies Basic Metabolic Panel: Recent Labs  Lab 08/01/21 1254 08/01/21 1304 08/01/21 1728 08/02/21 0500 08/03/21 0502 08/04/21 0448 08/05/21 0607  NA 136 139  --  141 146* 147* 141  K 2.7* 2.8*  --  3.0* 3.4* 3.3*  4.3  CL 99 103  --  109 113* 114* 111  CO2 26  --   --  24 26 25 25   GLUCOSE 147* 148*  --  129* 120* 133* 169*  BUN 61* 55*  --  49* 48* 41* 26*  CREATININE 2.46* 2.50*  --  2.04* 2.09* 1.76* 1.55*  CALCIUM 12.0*  --   --  11.1* 11.6* 11.7* 11.3*  MG  --   --  2.4  --   --  2.2  --    Liver Function Tests: Recent Labs  Lab 08/01/21 1254 08/03/21 0502 08/04/21 0448 08/05/21 0607  AST 25 31 37 31  ALT 24 36 53* 53*  ALKPHOS 85 69 71 68  BILITOT 1.2 0.9 0.9 0.7  PROT 8.6* 6.2* 6.2* 6.1*  ALBUMIN 4.6 3.3* 3.2* 3.1*   No results for input(s): LIPASE, AMYLASE in the last 168 hours. No  results for input(s): AMMONIA in the last 168 hours. Coagulation Profile: No results for input(s): INR, PROTIME in the last 168 hours. CBC: Recent Labs  Lab 08/01/21 1254 08/01/21 1304 08/01/21 1929 08/02/21 0500 08/03/21 0502 08/04/21 0448 08/05/21 0607  WBC 27.9*  --  23.7* 20.6* 19.1* 17.7* 15.9*  NEUTROABS 23.3*  --   --   --   --   --   --   HGB 16.0*   < > 13.5 13.7 12.6 12.9 12.2  HCT 46.5*   < > 40.0 40.4 38.8 39.3 37.1  MCV 93.0  --  93.7 94.6 97.5 95.9 97.1  PLT 227  --  169 170 175 195 194   < > = values in this interval not displayed.   Cardiac Enzymes: No results for input(s): CKTOTAL, CKMB, CKMBINDEX, TROPONINI in the last 168 hours. BNP: Invalid input(s): POCBNP CBG: Recent Labs  Lab 08/01/21 2244 08/02/21 0541 08/02/21 1112  GLUCAP 125* 114* 118*   HbA1C: No results for input(s): HGBA1C in the last 72 hours. Urine analysis:    Component Value Date/Time   COLORURINE YELLOW 08/02/2021 1650   APPEARANCEUR HAZY (A) 08/02/2021 1650   LABSPEC 1.013 08/02/2021 1650   PHURINE 7.0 08/02/2021 1650   GLUCOSEU 50 (A) 08/02/2021 1650   HGBUR NEGATIVE 08/02/2021 1650   BILIRUBINUR NEGATIVE 08/02/2021 1650   KETONESUR NEGATIVE 08/02/2021 1650   PROTEINUR 100 (A) 08/02/2021 1650   NITRITE NEGATIVE 08/02/2021 1650   LEUKOCYTESUR SMALL (A) 08/02/2021 1650   Sepsis Labs: @LABRCNTIP (procalcitonin:4,lacticidven:4) ) Recent Results (from the past 240 hour(s))  Resp Panel by RT-PCR (Flu A&B, Covid) Nasopharyngeal Swab     Status: None   Collection Time: 08/01/21  2:17 PM   Specimen: Nasopharyngeal Swab; Nasopharyngeal(NP) swabs in vial transport medium  Result Value Ref Range Status   SARS Coronavirus 2 by RT PCR NEGATIVE NEGATIVE Final    Comment: (NOTE) SARS-CoV-2 target nucleic acids are NOT DETECTED.  The SARS-CoV-2 RNA is generally detectable in upper respiratory specimens during the acute phase of infection. The lowest concentration of SARS-CoV-2 viral  copies this assay can detect is 138 copies/mL. A negative result does not preclude SARS-Cov-2 infection and should not be used as the sole basis for treatment or other patient management decisions. A negative result may occur with  improper specimen collection/handling, submission of specimen other than nasopharyngeal swab, presence of viral mutation(s) within the areas targeted by this assay, and inadequate number of viral copies(<138 copies/mL). A negative result must be combined with clinical observations, patient history, and epidemiological information. The expected  result is Negative.  Fact Sheet for Patients:  EntrepreneurPulse.com.au  Fact Sheet for Healthcare Providers:  IncredibleEmployment.be  This test is no t yet approved or cleared by the [place] FDA and  has been authorized for detection and/or diagnosis of SARS-CoV-2 by FDA under an Emergency Use Authorization (EUA). This EUA will remain  in effect (meaning this test can be used) for the duration of the COVID-19 declaration under Section 564(b)(1) of the Act, 21 U.S.C.section 360bbb-3(b)(1), unless the authorization is terminated  or revoked sooner.       Influenza A by PCR NEGATIVE NEGATIVE Final   Influenza B by PCR NEGATIVE NEGATIVE Final    Comment: (NOTE) The Xpert Xpress SARS-CoV-2/FLU/RSV plus assay is intended as an aid in the diagnosis of influenza from Nasopharyngeal swab specimens and should not be used as a sole basis for treatment. Nasal washings and aspirates are unacceptable for Xpert Xpress SARS-CoV-2/FLU/RSV testing.  Fact Sheet for Patients: EntrepreneurPulse.com.au  Fact Sheet for Healthcare Providers: IncredibleEmployment.be  This test is not yet approved or cleared by the [place] FDA and has been authorized for detection and/or diagnosis of SARS-CoV-2 by FDA under an Emergency Use Authorization (EUA). This  EUA will remain in effect (meaning this test can be used) for the duration of the COVID-19 declaration under Section 564(b)(1) of the Act, 21 U.S.C. section 360bbb-3(b)(1), unless the authorization is terminated or revoked.  Performed at [facility], [address] [place], [place] [address]   MRSA Next Gen by PCR, Nasal     Status: None   Collection Time: 08/01/21 10:11 PM   Specimen: Nasal Mucosa; Nasal Swab  Result Value Ref Range Status   MRSA by PCR Next Gen NOT DETECTED NOT DETECTED Final    Comment: (NOTE) The GeneXpert MRSA Assay (FDA approved for NASAL specimens only), is one component of a comprehensive MRSA colonization surveillance program. It is not intended to diagnose MRSA infection nor to guide or monitor treatment for MRSA infections. Test performance is not FDA approved in patients less than 67 years old. Performed at [facility], [address] [place], [place] [address]   Urine Culture     Status: Abnormal   Collection Time: 08/02/21  4:50 PM   Specimen: Urine, Clean Catch  Result Value Ref Range Status   Specimen Description   Final    URINE, CLEAN CATCH Performed at [facility], [address], [place], [place] [address]    Special Requests   Final    NONE Performed at [facility], [address] [place], [place] [address]    Culture (A)  Final    <10,000 COLONIES/mL INSIGNIFICANT GROWTH Performed at [facility], [place] [address], [place], [place] [address]    Report Status 08/04/2021 FINAL  Final     Scheduled Meds:  alum & mag hydroxide-simeth  30 mL Oral Once   And   lidocaine  15 mL Oral Once   carvedilol  9.375 mg Oral BID WC   Chlorhexidine Gluconate Cloth  6 each Topical Daily   linaclotide  145 mcg Oral QAC breakfast   metoCLOPramide (REGLAN) injection  5 mg Intravenous Q6H   pantoprazole (PROTONIX) IV  40 mg Intravenous Q12H   polyethylene glycol  17 g Oral BID   senna  2 tablet Oral Daily   Continuous Infusions:  dextrose 5  % and 0.45 % NaCl with KCl 20 mEq/L 150 mL/hr at 08/05/21 0216   promethazine (PHENERGAN) injection (IM or IVPB)  Procedures/Studies: CT ABDOMEN PELVIS WO CONTRAST  Result Date: 08/01/2021 CLINICAL DATA:  Abdominal pain, nausea and vomiting for 3 days EXAM: CT ABDOMEN AND PELVIS WITHOUT CONTRAST TECHNIQUE: Multidetector CT imaging of the abdomen and pelvis was performed following the standard protocol without IV contrast. COMPARISON:  07/31/2021, 06/13/2021, 11/09/2020 FINDINGS: Lower chest: No acute pleural or parenchymal lung disease. Stable pacemaker. Unenhanced CT was performed per clinician order. Lack of IV contrast limits sensitivity and specificity, especially for evaluation of abdominal/pelvic solid viscera. Hepatobiliary: No focal liver abnormality is seen. Status post cholecystectomy. Stable postsurgical dilatation of the common bile duct. Pancreas: Unremarkable. No pancreatic ductal dilatation or surrounding inflammatory changes. Spleen: Normal in size without focal abnormality. Adrenals/Urinary Tract: Innumerable bilateral renal cortical cysts are identified, some displaying thin peripheral calcifications. Evaluation is incomplete on unenhanced exam, but there is no gross change since the preceding study. No urinary tract calculi or obstructive uropathy. The adrenals and bladder are stable. Stomach/Bowel: No bowel obstruction or ileus. Moderate retained stool throughout the colon. The appendix, if still present, is not well visualized. No bowel wall thickening or inflammatory change. Vascular/Lymphatic: Aortic atherosclerosis. No enlarged abdominal or pelvic lymph nodes. Reproductive: Status post hysterectomy. No adnexal masses. Other: No free fluid or free gas.  No abdominal wall hernia. Musculoskeletal: There are no acute or destructive bony lesions. Stable degenerative changes of the lumbar spine and bilateral hips. Reconstructed images demonstrate no additional findings. IMPRESSION: 1. No  acute intra-abdominal or intrapelvic process. 2. Stable mildly complex bilateral renal cysts, incompletely evaluated by unenhanced exam. 3. No bowel obstruction or ileus. Moderate residual stool throughout the colon. 4.  Aortic Atherosclerosis (ICD10-I70.0). Electronically Signed   By: Randa Ngo M.D.   On: 08/01/2021 17:01   DG Abd 1 View  Result Date: 08/01/2021 CLINICAL DATA:  Abdominal pain, nausea, constipation EXAM: ABDOMEN - 1 VIEW COMPARISON:  KUB 06/14/2021 FINDINGS: There is a nonobstructive bowel gas pattern. There is a moderate colonic stool burden projecting over the rectum and right hemiabdomen. There is no gross organomegaly or abnormal soft tissue calcification. There is no acute osseous abnormality. IMPRESSION: Moderate colonic stool burden as above without evidence of mechanical obstruction. Electronically Signed   By: Valetta Mole M.D.   On: 08/01/2021 13:39   DG Chest Port 1 View  Result Date: 08/01/2021 CLINICAL DATA:  Weakness.  Abdominal pain.  Nausea and vomiting. EXAM: PORTABLE CHEST 1 VIEW COMPARISON:  CT chest 06/13/2021 FINDINGS: Heart size is normal. Lungs are clear. No focal nodule, mass, or airspace disease is present. No edema or effusion is present. Pacemaker wires are stable. IMPRESSION: No acute cardiopulmonary disease or significant interval change. Electronically Signed   By: San Morelle M.D.   On: 08/01/2021 15:08   CUP PACEART REMOTE DEVICE CHECK  Result Date: 07/12/2021 Scheduled remote reviewed. Normal device function.  Next remote in 91 days. Kathy Breach, RN, CCDS, [employer]  ECHOCARDIOGRAM LIMITED  Result Date: 08/01/2021    ECHOCARDIOGRAM LIMITED REPORT   Patient Name:   MODELL FENDRICK Date of Exam: 08/01/2021 Medical Rec #:  [id number]        Height:       64.0 in Accession #:    [id number]       Weight:       144.0 lb Date of Birth:  04/13/1951        BSA:          1.701 m Patient Age:    22 years  BP:           188/104 mmHg  Patient Gender: F                HR:           68 bpm. Exam Location:  [facility] Procedure: 2D Echo, Cardiac Doppler and Color Doppler Indications:    Abnormal ECG R94.31  History:        Patient has prior history of Echocardiogram examinations, most                 recent 06/13/2021. Hypertrophic Cardiomyopathy, CAD and STEMI (ST                 elevation myocardial infarction), Pacemaker; Risk                 Factors:Hypertension and Diabetes. Sick sinus syndrome (HCC)                 (From Hx).  Sonographer:    Alvino Chapel RCS Referring Phys: [id number] [place]  1. There is moderate asymmsetric septal hypertrophy. The apex is not completely visualized but appears to have severe apical hypertrophy with systolic apical obliteration but no significant intracavitary gradient. Findings consistent with apical hypertrophic cardiomyopathy. . Left ventricular ejection fraction, by estimation, is 65 to 70%. The left ventricle has normal function. The left ventricle has no regional wall motion abnormalities. Left ventricular diastolic parameters are consistent with Grade I diastolic dysfunction (impaired relaxation).  2. Right ventricular systolic function is normal. The right ventricular size is normal.  3. Limited study to evaluate LV function FINDINGS  Left Ventricle: There is moderate asymmsetric septal hypertrophy. The apex is not completely visualized but appears to have severe apical hypertrophy with systolic apical obliteration but no significant intracavitary gradient. Findings consistent with apical hypertrophic cardiomyopathy. Left ventricular ejection fraction, by estimation, is 65 to 70%. The left ventricle has normal function. The left ventricle has no regional wall motion abnormalities. Left ventricular diastolic parameters are consistent with Grade I diastolic dysfunction (impaired relaxation). Right Ventricle: The right ventricular size is normal. No increase in right ventricular wall  thickness. Right ventricular systolic function is normal. LEFT VENTRICLE PLAX 2D LVIDd:         4.07 cm  Diastology LVIDs:         2.71 cm  LV e' lateral:   4.82 cm/s LV PW:         1.09 cm  LV E/e' lateral: 10.6 LV IVS:        1.46 cm LVOT diam:     2.30 cm LV SV:         86 LV SV Index:   51 LVOT Area:     4.15 cm  RIGHT VENTRICLE RV S prime:     12.90 cm/s TAPSE (M-mode): 1.5 cm LEFT ATRIUM           Index       RIGHT ATRIUM           Index LA diam:      3.20 cm 1.88 cm/m  RA Area:     11.50 cm LA Vol (A4C): 29.9 ml 17.58 ml/m RA Volume:   26.20 ml  15.40 ml/m  AORTIC VALVE LVOT Vmax:   120.00 cm/s LVOT Vmean:  76.100 cm/s LVOT VTI:    0.208 m  AORTA Ao Root diam: 3.60 cm MITRAL VALVE MV Area (PHT): 3.15 cm     SHUNTS MV  Decel Time: 241 msec     Systemic VTI:  0.21 m MV E velocity: 51.00 cm/s   Systemic Diam: 2.30 cm MV A velocity: 115.00 cm/s MV E/A ratio:  0.44 Carlyle Dolly MD Electronically signed by Carlyle Dolly MD Signature Date/Time: 08/01/2021/3:51:22 PM    Final     Orson Eva, DO  [facility]  If 7PM-7AM, please contact night-coverage [email or web address] Password [email or web address] 08/05/2021, 1:25 PM   LOS: 4 days

## 2021-08-05 NOTE — Plan of Care (Signed)
  Problem: Clinical Measurements: Goal: Diagnostic test results will improve Outcome: Progressing Goal: Cardiovascular complication will be avoided Outcome: Progressing   Problem: Activity: Goal: Risk for activity intolerance will decrease Outcome: Progressing   

## 2021-08-05 NOTE — Progress Notes (Signed)
Patient complains of chest pain 9/10. 12 lead EKG completed and in patient's chart. Vitals are as follows:    08/05/21 1239  Vitals  Temp 98 F (36.7 C)  Temp Source Oral  BP (!) 178/95  BP Location Left Arm  BP Method Automatic  Patient Position (if appropriate) Lying  Pulse Rate 79  Pulse Rate Source Dinamap  Resp 18  MEWS COLOR  MEWS Score Color Green  Oxygen Therapy  SpO2 100 %  O2 Device Room Air  Pain Assessment  Pain Scale 0-10  Pain Score 9  Pain Type Acute pain  Pain Location Chest (epigastric)  Pain Orientation Upper  Pain Descriptors / Indicators Aching  Patients Stated Pain Goal 0  MEWS Score  MEWS Temp 0  MEWS Systolic 0  MEWS Pulse 0  MEWS RR 0  MEWS LOC 0  MEWS Score 0   MD has been notified.

## 2021-08-05 NOTE — Progress Notes (Signed)
Patient reassessed and rates chest pain at 3/10 using pain scale 0-10. Patient denies any discomfort at this time and is asleep resting comfortably in bed.

## 2021-08-06 ENCOUNTER — Inpatient Hospital Stay (HOSPITAL_COMMUNITY): Payer: Medicare (Managed Care)

## 2021-08-06 DIAGNOSIS — R1013 Epigastric pain: Secondary | ICD-10-CM | POA: Diagnosis not present

## 2021-08-06 DIAGNOSIS — K920 Hematemesis: Secondary | ICD-10-CM | POA: Diagnosis not present

## 2021-08-06 DIAGNOSIS — R109 Unspecified abdominal pain: Secondary | ICD-10-CM

## 2021-08-06 LAB — COMPREHENSIVE METABOLIC PANEL
ALT: 38 U/L (ref 0–44)
AST: 20 U/L (ref 15–41)
Albumin: 2.7 g/dL — ABNORMAL LOW (ref 3.5–5.0)
Alkaline Phosphatase: 67 U/L (ref 38–126)
Anion gap: 5 (ref 5–15)
BUN: 21 mg/dL (ref 8–23)
CO2: 23 mmol/L (ref 22–32)
Calcium: 10.5 mg/dL — ABNORMAL HIGH (ref 8.9–10.3)
Chloride: 109 mmol/L (ref 98–111)
Creatinine, Ser: 1.48 mg/dL — ABNORMAL HIGH (ref 0.44–1.00)
GFR, Estimated: 38 mL/min — ABNORMAL LOW (ref >60)
Glucose, Bld: 163 mg/dL — ABNORMAL HIGH (ref 70–99)
Potassium: 3.7 mmol/L (ref 3.5–5.1)
Sodium: 137 mmol/L (ref 135–145)
Total Bilirubin: 0.5 mg/dL (ref 0.3–1.2)
Total Protein: 5.4 g/dL — ABNORMAL LOW (ref 6.5–8.1)

## 2021-08-06 MED ORDER — LINACLOTIDE 145 MCG PO CAPS
290.0000 ug | ORAL_CAPSULE | Freq: Every day | ORAL | Status: DC
  Administered 2021-08-06 – 2021-08-07 (×2): 290 ug via ORAL
  Filled 2021-08-06 (×2): qty 2

## 2021-08-06 MED ORDER — METOCLOPRAMIDE HCL 5 MG/ML IJ SOLN
5.0000 mg | Freq: Three times a day (TID) | INTRAMUSCULAR | Status: DC
  Administered 2021-08-06 – 2021-08-07 (×4): 5 mg via INTRAVENOUS
  Filled 2021-08-06 (×4): qty 2

## 2021-08-06 NOTE — TOC Progression Note (Signed)
Transition of Care [facility]) - Progression Note    Patient Details  Name: Tylasia Fletchall MRN: [id number] Date of Birth: [birth year]  Transition of Care [facility]) CM/SW Contact  Natasha Bence, LCSW Phone Number: 08/06/2021, 3:20 PM  Clinical Narrative:    CSW provided update for SW for [facility] and notified them of expected discharge for tomorrow. 08/06/2021   Expected Discharge Plan: Home/Self Care Barriers to Discharge: Continued Medical Work up  Expected Discharge Plan and Services Expected Discharge Plan: Home/Self Care     Post Acute Care Choice: Resumption of Svcs/PTA Provider Living arrangements for the past 2 months: Single Family Home                                       Social Determinants of Health (SDOH) Interventions    Readmission Risk Interventions Readmission Risk Prevention Plan 11/17/2020  Transportation Screening Complete  PCP or Specialist Appt within 3-5 Days Complete  HRI or [place] Complete  Social Work Consult for [place] Planning/Counseling Complete  Palliative Care Screening Not Applicable  Medication Review Press photographer) Complete  Some recent data might be hidden

## 2021-08-06 NOTE — Progress Notes (Signed)
Remote pacemaker transmission.   

## 2021-08-06 NOTE — Progress Notes (Signed)
PROGRESS NOTE  Melissa Lopez XMI:[id number] DOB: 1951/04/19 DOA: 08/01/2021 PCP: [facility] [place]    Brief History:  70 year old female with a history of CKD stage III, coronary artery disease, sick sinus syndrome status post permanent pacemaker, primary hyperparathyroidism, diabetes mellitus with gastroparesis, and hypertension presenting with nausea and vomiting that began on 07/27/2021.  The patient has had vomiting on a daily basis.  However the patient's daughter noted that she began having hematemesis on the morning of 08/01/2021.  She described it as a 1-1/2 cups of blood.  As result, the patient was brought to the hospital for further evaluation.  The patient has not had any fevers, chills, chest pain, shortness of breath, palpitations, syncope.  She has not had any diarrhea, hematochezia, melena.  In fact, the patient has trouble with constipation.  There is not been any new medications.  The patient has not had any recent travels or eaten any undercooked or exotic foods.  She has not had any sick contacts.  Patient has been complaining of intermittent abdominal pain.  She has not been taking any NSAIDs. In the emergency department, the patient was afebrile and hemodynamically stable with oxygen saturation 96/97% on room air.  CT of the abdomen and pelvis showed stable CBD dilatation.  There were innumerable bilateral renal cysts without hydronephrosis.  There is moderate stool in the colon.  WBC was 27.9 with hemoglobin 16.0 and platelets 170,000.  BMP showed serum creatinine 2.46.  The patient was started on IV fluids.  GI was consulted to assist with management.   Notably, the patient had a recent hospital admission from 06/12/2021 to 06/18/2021 at which time she was treated for hypercalcemia, AKI, and shaking spells.  Her hypercalcemia improved with IV fluids.  It was felt this was due to primary hyperparathyroidism.  She was seen by neurology and felt that  shaking spells were due to her primary metabolic abnormalities.  EEG was negative.  She was discharged with instructions to follow-up with surgery for possible parathyroidectomy.  However, the patient's daughter states that they have been following with physician staff at pace he did not feel the patient was a good surgical candidate.  The patient was subsequently continued on Cinacalcet.   Assessment/Plan: Hematemesis/Intractable vomiting -Suspect Mallory-Weiss tear -recurrent vomiting--multifactorial hypercalcemia in setting of DM gastroparesis and cannabis use -Appreciate GI consult>>planning EGD 8/26 -Patient has had numerous EGDs in the past for nausea and vomiting -October 2021 EGD--normal esophagus, erythematous mucosa in the antrum, normal duodenum.  Presence of bile reflux suggest gastric dysmotility or bile reflux gastritis--biopsies were negative for H. Pylori -Metoclopramide was discontinued last hospitalization was due to concerns for tardive dyskinesia -08/03/21 EGD--severe reflux esophagitis; bile gastritis -continue PPI bid -continue IVF -restart miralax and senna -AM cortisol 24.2 -reglan IV added -now tolerating  cardiac diet   Hypercalcemia/primary hyperparathyroidism -Corrected Ca = 12.0 at time of admission -8/26--Corrected Ca = 11.9>>11.5 -restart IVF -hypercalemia also exacerbating  emesis -will ultimately need surgical eval -change fluid to D51/2NS and increase rate -given slow/poor response to IVF-->given pamidronate x 1 8/28   Acute on Chronic renal failure--CKD 3 b -baseline creatinine 1.4-1.6 -presented with serum creatinine 2.46 -due to volume depletion -continue IVF>>improving   Constipation -multifactorial including opioids, hypercalcemia, inactivity -Linzess increased to 290 -continue miralax and senna   Chest pain-Atypical -related to severe reflux esophagitis -GI cocktail>>improved -personally reviewed EKG--sinus, nonspecific STT changes    Pyuria -UA =  11-20 WBC -follow urine culture>>less than 10K organisms -discontinue empiric ceftriaxone   Acute metabolic Encephalopathy -multifactorial including hypercalcemia, dehydration, AKI, possible UTI, hospital delirium -8/27--back to baseline   Tachybradysyndrome s/p PPM -follows Dr. Lovena Le   Hypertrophic Cardiomyopathy/elevated Troponin -8/24 limited echo EF 65-70; apical HCM -appreciate Dr. Harl Bowie input--has chronic ST elevation likely related to her apical hypertrophy -has had 2 prior catheterizations in the setting of vomiting with similar ST elevations-2019, 2021, both negative for significant disease. -Trop 140 > 109>>48   Hypertension -patient needs higher systolic blood pressure goal to reduce her risk of falls and syncope.  Patient's amlodipine and hydralazine were discontinued. -she was discharged with coreg and amlodipine in July 2022 but appears amlodipine d/ced since then -prn hydralazine Likely she will need a higher systolic BP goal of <161 due to her symptomatic hypotension -increase coreg to 9.375 mg bid   DM2 with gastroparesis and orthostasis/dysautonomia -no longer on meds per daughter -08/02/21 A1C--5.5 -d/c sliding scale   Hypokalemia -replete -mag 2.4 -add KCl to IVF   Prior CVA R Thalamic lacunar stroke Residual left sided numbness per most recent Neurology note (Dr. Andrey Spearman). Patient was continued on home aspirin 81mg .    Chronic diastolic CHF -0/96 echo EF 65-70%, G1DD -clinically hypovolemic presently       Status is: Inpatient   Remains inpatient appropriate because:Inpatient level of care appropriate due to severity of illness   Dispo: The patient is from: Home              Anticipated d/c is to: Home              Patient currently is not medically stable to d/c.              Difficult to place patient No               Family Communication:   daughter updated 8/29   Consultants:  GI   Code Status:  FULL     DVT Prophylaxis:  [place] Heparin     Procedures: As Listed in Progress Note Above   Antibiotics: None  Subjective: Patient had a documented small BM last eve.  Denies f/c, cp, sob, n/v.  Still having intermittent abd pain.    Objective: Vitals:   08/05/21 2059 08/06/21 0023 08/06/21 0510 08/06/21 1021  BP: (!) 150/89  (!) 159/91 (!) 147/87  Pulse: 64  62 72  Resp: 18  17   Temp: 98.1 F (36.7 C)  99.6 F (37.6 C)   TempSrc: Oral  Oral   SpO2:  97% 97%   Weight:      Height:        Intake/Output Summary (Last 24 hours) at 08/06/2021 1204 Last data filed at 08/06/2021 0521 Gross per 24 hour  Intake 3746.44 ml  Output 600 ml  Net 3146.44 ml   Weight change:  Exam:  General:  Pt is alert, follows commands appropriately, not in acute distress HEENT: No icterus, No thrush, No neck mass, [place]/AT Cardiovascular: RRR, S1/S2, no rubs, no gallops Respiratory: bibasilar crackles. No wheeze Abdomen: Soft/+BS, diffuse tender to feather touch, non distended, no guarding Extremities: No edema, No lymphangitis, No petechiae, No rashes, no synovitis   Data Reviewed: I have personally reviewed following labs and imaging studies Basic Metabolic Panel: Recent Labs  Lab 08/01/21 1728 08/02/21 0500 08/03/21 0502 08/04/21 0448 08/05/21 0607 08/06/21 0547  NA  --  141 146* 147* 141 137  K  --  3.0* 3.4* 3.3* 4.3 3.7  CL  --  109 113* 114* 111 109  CO2  --  24 26 25 25 23   GLUCOSE  --  129* 120* 133* 169* 163*  BUN  --  49* 48* 41* 26* 21  CREATININE  --  2.04* 2.09* 1.76* 1.55* 1.48*  CALCIUM  --  11.1* 11.6* 11.7* 11.3* 10.5*  MG 2.4  --   --  2.2  --   --    Liver Function Tests: Recent Labs  Lab 08/01/21 1254 08/03/21 0502 08/04/21 0448 08/05/21 0607 08/06/21 0547  AST 25 31 37 31 20  ALT 24 36 53* 53* 38  ALKPHOS 85 69 71 68 67  BILITOT 1.2 0.9 0.9 0.7 0.5  PROT 8.6* 6.2* 6.2* 6.1* 5.4*  ALBUMIN 4.6 3.3* 3.2* 3.1* 2.7*   No results for input(s): LIPASE, AMYLASE  in the last 168 hours. No results for input(s): AMMONIA in the last 168 hours. Coagulation Profile: No results for input(s): INR, PROTIME in the last 168 hours. CBC: Recent Labs  Lab 08/01/21 1254 08/01/21 1304 08/01/21 1929 08/02/21 0500 08/03/21 0502 08/04/21 0448 08/05/21 0607  WBC 27.9*  --  23.7* 20.6* 19.1* 17.7* 15.9*  NEUTROABS 23.3*  --   --   --   --   --   --   HGB 16.0*   < > 13.5 13.7 12.6 12.9 12.2  HCT 46.5*   < > 40.0 40.4 38.8 39.3 37.1  MCV 93.0  --  93.7 94.6 97.5 95.9 97.1  PLT 227  --  169 170 175 195 194   < > = values in this interval not displayed.   Cardiac Enzymes: No results for input(s): CKTOTAL, CKMB, CKMBINDEX, TROPONINI in the last 168 hours. BNP: Invalid input(s): POCBNP CBG: Recent Labs  Lab 08/01/21 2244 08/02/21 0541 08/02/21 1112  GLUCAP 125* 114* 118*   HbA1C: No results for input(s): HGBA1C in the last 72 hours. Urine analysis:    Component Value Date/Time   COLORURINE YELLOW 08/02/2021 1650   APPEARANCEUR HAZY (A) 08/02/2021 1650   LABSPEC 1.013 08/02/2021 1650   PHURINE 7.0 08/02/2021 1650   GLUCOSEU 50 (A) 08/02/2021 1650   HGBUR NEGATIVE 08/02/2021 1650   BILIRUBINUR NEGATIVE 08/02/2021 1650   KETONESUR NEGATIVE 08/02/2021 1650   PROTEINUR 100 (A) 08/02/2021 1650   NITRITE NEGATIVE 08/02/2021 1650   LEUKOCYTESUR SMALL (A) 08/02/2021 1650   Sepsis Labs: @LABRCNTIP (procalcitonin:4,lacticidven:4) ) Recent Results (from the past 240 hour(s))  Resp Panel by RT-PCR (Flu A&B, Covid) Nasopharyngeal Swab     Status: None   Collection Time: 08/01/21  2:17 PM   Specimen: Nasopharyngeal Swab; Nasopharyngeal(NP) swabs in vial transport medium  Result Value Ref Range Status   SARS Coronavirus 2 by RT PCR NEGATIVE NEGATIVE Final    Comment: (NOTE) SARS-CoV-2 target nucleic acids are NOT DETECTED.  The SARS-CoV-2 RNA is generally detectable in upper respiratory specimens during the acute phase of infection. The  lowest concentration of SARS-CoV-2 viral copies this assay can detect is 138 copies/mL. A negative result does not preclude SARS-Cov-2 infection and should not be used as the sole basis for treatment or other patient management decisions. A negative result may occur with  improper specimen collection/handling, submission of specimen other than nasopharyngeal swab, presence of viral mutation(s) within the areas targeted by this assay, and inadequate number of viral copies(<138 copies/mL). A negative result must be combined with clinical observations, patient history, and epidemiological information. The expected result  is Negative.  Fact Sheet for Patients:  EntrepreneurPulse.com.au  Fact Sheet for Healthcare Providers:  IncredibleEmployment.be  This test is no t yet approved or cleared by the [place] FDA and  has been authorized for detection and/or diagnosis of SARS-CoV-2 by FDA under an Emergency Use Authorization (EUA). This EUA will remain  in effect (meaning this test can be used) for the duration of the COVID-19 declaration under Section 564(b)(1) of the Act, 21 U.S.C.section 360bbb-3(b)(1), unless the authorization is terminated  or revoked sooner.       Influenza A by PCR NEGATIVE NEGATIVE Final   Influenza B by PCR NEGATIVE NEGATIVE Final    Comment: (NOTE) The Xpert Xpress SARS-CoV-2/FLU/RSV plus assay is intended as an aid in the diagnosis of influenza from Nasopharyngeal swab specimens and should not be used as a sole basis for treatment. Nasal washings and aspirates are unacceptable for Xpert Xpress SARS-CoV-2/FLU/RSV testing.  Fact Sheet for Patients: EntrepreneurPulse.com.au  Fact Sheet for Healthcare Providers: IncredibleEmployment.be  This test is not yet approved or cleared by the [place] FDA and has been authorized for detection and/or diagnosis of SARS-CoV-2 by FDA under  an Emergency Use Authorization (EUA). This EUA will remain in effect (meaning this test can be used) for the duration of the COVID-19 declaration under Section 564(b)(1) of the Act, 21 U.S.C. section 360bbb-3(b)(1), unless the authorization is terminated or revoked.  Performed at [facility], [address], [place], [place] [address]   MRSA Next Gen by PCR, Nasal     Status: None   Collection Time: 08/01/21 10:11 PM   Specimen: Nasal Mucosa; Nasal Swab  Result Value Ref Range Status   MRSA by PCR Next Gen NOT DETECTED NOT DETECTED Final    Comment: (NOTE) The GeneXpert MRSA Assay (FDA approved for NASAL specimens only), is one component of a comprehensive MRSA colonization surveillance program. It is not intended to diagnose MRSA infection nor to guide or monitor treatment for MRSA infections. Test performance is not FDA approved in patients less than 34 years old. Performed at [facility], [address] [place], [place] [address]   Urine Culture     Status: Abnormal   Collection Time: 08/02/21  4:50 PM   Specimen: Urine, Clean Catch  Result Value Ref Range Status   Specimen Description   Final    URINE, CLEAN CATCH Performed at [facility], [address], [place], [place] [address]    Special Requests   Final    NONE Performed at [facility], [address] [place], [place] [address]    Culture (A)  Final    <10,000 COLONIES/mL INSIGNIFICANT GROWTH Performed at [facility], [place] [address] [place], [place] [address]    Report Status 08/04/2021 FINAL  Final     Scheduled Meds:  carvedilol  9.375 mg Oral BID WC   Chlorhexidine Gluconate Cloth  6 each Topical Daily   linaclotide  290 mcg Oral QAC breakfast   metoCLOPramide (REGLAN) injection  5 mg Intravenous TID AC   pantoprazole (PROTONIX) IV  40 mg Intravenous Q12H   polyethylene glycol  17 g Oral BID   senna  2 tablet Oral Daily   Continuous Infusions:  dextrose 5 % and 0.45 % NaCl with KCl 20 mEq/L 150  mL/hr at 08/06/21 0447   promethazine (PHENERGAN) injection (IM or IVPB)      Procedures/Studies: CT ABDOMEN PELVIS WO CONTRAST  Result Date: 08/01/2021 CLINICAL DATA:  Abdominal pain, nausea and  vomiting for 3 days EXAM: CT ABDOMEN AND PELVIS WITHOUT CONTRAST TECHNIQUE: Multidetector CT imaging of the abdomen and pelvis was performed following the standard protocol without IV contrast. COMPARISON:  07/31/2021, 06/13/2021, 11/09/2020 FINDINGS: Lower chest: No acute pleural or parenchymal lung disease. Stable pacemaker. Unenhanced CT was performed per clinician order. Lack of IV contrast limits sensitivity and specificity, especially for evaluation of abdominal/pelvic solid viscera. Hepatobiliary: No focal liver abnormality is seen. Status post cholecystectomy. Stable postsurgical dilatation of the common bile duct. Pancreas: Unremarkable. No pancreatic ductal dilatation or surrounding inflammatory changes. Spleen: Normal in size without focal abnormality. Adrenals/Urinary Tract: Innumerable bilateral renal cortical cysts are identified, some displaying thin peripheral calcifications. Evaluation is incomplete on unenhanced exam, but there is no gross change since the preceding study. No urinary tract calculi or obstructive uropathy. The adrenals and bladder are stable. Stomach/Bowel: No bowel obstruction or ileus. Moderate retained stool throughout the colon. The appendix, if still present, is not well visualized. No bowel wall thickening or inflammatory change. Vascular/Lymphatic: Aortic atherosclerosis. No enlarged abdominal or pelvic lymph nodes. Reproductive: Status post hysterectomy. No adnexal masses. Other: No free fluid or free gas.  No abdominal wall hernia. Musculoskeletal: There are no acute or destructive bony lesions. Stable degenerative changes of the lumbar spine and bilateral hips. Reconstructed images demonstrate no additional findings. IMPRESSION: 1. No acute intra-abdominal or intrapelvic  process. 2. Stable mildly complex bilateral renal cysts, incompletely evaluated by unenhanced exam. 3. No bowel obstruction or ileus. Moderate residual stool throughout the colon. 4.  Aortic Atherosclerosis (ICD10-I70.0). Electronically Signed   By: Randa Ngo M.D.   On: 08/01/2021 17:01   DG Abd 1 View  Result Date: 08/01/2021 CLINICAL DATA:  Abdominal pain, nausea, constipation EXAM: ABDOMEN - 1 VIEW COMPARISON:  KUB 06/14/2021 FINDINGS: There is a nonobstructive bowel gas pattern. There is a moderate colonic stool burden projecting over the rectum and right hemiabdomen. There is no gross organomegaly or abnormal soft tissue calcification. There is no acute osseous abnormality. IMPRESSION: Moderate colonic stool burden as above without evidence of mechanical obstruction. Electronically Signed   By: Valetta Mole M.D.   On: 08/01/2021 13:39   DG Chest Port 1 View  Result Date: 08/01/2021 CLINICAL DATA:  Weakness.  Abdominal pain.  Nausea and vomiting. EXAM: PORTABLE CHEST 1 VIEW COMPARISON:  CT chest 06/13/2021 FINDINGS: Heart size is normal. Lungs are clear. No focal nodule, mass, or airspace disease is present. No edema or effusion is present. Pacemaker wires are stable. IMPRESSION: No acute cardiopulmonary disease or significant interval change. Electronically Signed   By: San Morelle M.D.   On: 08/01/2021 15:08   CUP PACEART REMOTE DEVICE CHECK  Result Date: 07/12/2021 Scheduled remote reviewed. Normal device function.  Next remote in 91 days. Kathy Breach, RN, CCDS, [employer]  ECHOCARDIOGRAM LIMITED  Result Date: 08/01/2021    ECHOCARDIOGRAM LIMITED REPORT   Patient Name:   Melissa Lopez Date of Exam: 08/01/2021 Medical Rec #:  [id number]        Height:       64.0 in Accession #:    [id number]       Weight:       144.0 lb Date of Birth:  Aug 26, 1951        BSA:          1.701 m Patient Age:    57 years         BP:  188/104 mmHg Patient Gender: F                HR:            68 bpm. Exam Location:  [facility] Procedure: 2D Echo, Cardiac Doppler and Color Doppler Indications:    Abnormal ECG R94.31  History:        Patient has prior history of Echocardiogram examinations, most                 recent 06/13/2021. Hypertrophic Cardiomyopathy, CAD and STEMI (ST                 elevation myocardial infarction), Pacemaker; Risk                 Factors:Hypertension and Diabetes. Sick sinus syndrome (HCC)                 (From Hx).  Sonographer:    Alvino Chapel RCS Referring Phys: [id number] [place]  1. There is moderate asymmsetric septal hypertrophy. The apex is not completely visualized but appears to have severe apical hypertrophy with systolic apical obliteration but no significant intracavitary gradient. Findings consistent with apical hypertrophic cardiomyopathy. . Left ventricular ejection fraction, by estimation, is 65 to 70%. The left ventricle has normal function. The left ventricle has no regional wall motion abnormalities. Left ventricular diastolic parameters are consistent with Grade I diastolic dysfunction (impaired relaxation).  2. Right ventricular systolic function is normal. The right ventricular size is normal.  3. Limited study to evaluate LV function FINDINGS  Left Ventricle: There is moderate asymmsetric septal hypertrophy. The apex is not completely visualized but appears to have severe apical hypertrophy with systolic apical obliteration but no significant intracavitary gradient. Findings consistent with apical hypertrophic cardiomyopathy. Left ventricular ejection fraction, by estimation, is 65 to 70%. The left ventricle has normal function. The left ventricle has no regional wall motion abnormalities. Left ventricular diastolic parameters are consistent with Grade I diastolic dysfunction (impaired relaxation). Right Ventricle: The right ventricular size is normal. No increase in right ventricular wall thickness. Right ventricular systolic  function is normal. LEFT VENTRICLE PLAX 2D LVIDd:         4.07 cm  Diastology LVIDs:         2.71 cm  LV e' lateral:   4.82 cm/s LV PW:         1.09 cm  LV E/e' lateral: 10.6 LV IVS:        1.46 cm LVOT diam:     2.30 cm LV SV:         86 LV SV Index:   51 LVOT Area:     4.15 cm  RIGHT VENTRICLE RV S prime:     12.90 cm/s TAPSE (M-mode): 1.5 cm LEFT ATRIUM           Index       RIGHT ATRIUM           Index LA diam:      3.20 cm 1.88 cm/m  RA Area:     11.50 cm LA Vol (A4C): 29.9 ml 17.58 ml/m RA Volume:   26.20 ml  15.40 ml/m  AORTIC VALVE LVOT Vmax:   120.00 cm/s LVOT Vmean:  76.100 cm/s LVOT VTI:    0.208 m  AORTA Ao Root diam: 3.60 cm MITRAL VALVE MV Area (PHT): 3.15 cm     SHUNTS MV Decel Time: 241 msec     Systemic VTI:  0.21 m MV E velocity: 51.00 cm/s   Systemic Diam: 2.30 cm MV A velocity: 115.00 cm/s MV E/A ratio:  0.44 Carlyle Dolly MD Electronically signed by Carlyle Dolly MD Signature Date/Time: 08/01/2021/3:51:22 PM    Final     Orson Eva, DO  [facility]  If 7PM-7AM, please contact night-coverage [email or web address] Password [email or web address] 08/06/2021, 12:04 PM   LOS: 5 days

## 2021-08-06 NOTE — Progress Notes (Signed)
    Subjective: No vomiting. Mild nausea. No appetite. Main complaint is constipation, stating she has not had a BM in 6-7 days. However, one stool is documented yesterday (small amount, [place] stool scale #1). Reports chronic constipation.   Objective: Vital signs in last 24 hours: Temp:  [98 F (36.7 C)-99.6 F (37.6 C)] 99.6 F (37.6 C) (08/29 0510) Pulse Rate:  [60-79] 62 (08/29 0510) Resp:  [17-18] 17 (08/29 0510) BP: (146-195)/(82-106) 159/91 (08/29 0510) SpO2:  [97 %-100 %] 97 % (08/29 0510)   General:   Alert and oriented, sitting up in chair.  Head:  Normocephalic and atraumatic. Abdomen:  Bowel sounds present, soft, non-distended. Mild discomfort lower abdomen bilaterally. Limited exam with patient sitting in chair Neurologic:  Alert and  oriented x4 Psych:  cooperative. Flat affect.   Intake/Output from previous day: 08/28 0701 - 08/29 0700 In: 3986.4 [P.O.:540; I.V.:3243.3; IV Piggyback:203.2] Out: 1400 [Urine:1400] Intake/Output this shift: No intake/output data recorded.  Lab Results: Recent Labs    08/04/21 0448 08/05/21 0607  WBC 17.7* 15.9*  HGB 12.9 12.2  HCT 39.3 37.1  PLT 195 194   BMET Recent Labs    08/04/21 0448 08/05/21 0607 08/06/21 0547  NA 147* 141 137  K 3.3* 4.3 3.7  CL 114* 111 109  CO2 25 25 23   GLUCOSE 133* 169* 163*  BUN 41* 26* 21  CREATININE 1.76* 1.55* 1.48*  CALCIUM 11.7* 11.3* 10.5*   LFT Recent Labs    08/04/21 0448 08/05/21 0607 08/06/21 0547  PROT 6.2* 6.1* 5.4*  ALBUMIN 3.2* 3.1* 2.7*  AST 37 31 20  ALT 53* 53* 38  ALKPHOS 71 68 20  BILITOT 0.9 0.7 0.5    Assessment: 70 year old female with multiple comorbidities and admitted with hematemesis and acute renal failure, s/p EGD 8/26 with severe reflux esophagitis, bile gastritis, felt to have delayed gastric emptying. Reglan resumed on 8/26, as patient has noted improvement with N/V on the past.   No further vomiting this admission, although she notes a  poor appetite. Her main complaint today is constipation, reporting very little output since admission. Last BM small amount [place] stool scale #1 yesterday despite taking Linzess 145 mcg daily, Miralax BID, and 2 senna tablets.   Need to continue PPI BID. Pathology from EGD pending. No untoward side effects noted with Reglan.    Plan: PPI BID Continue Reglan before meals Increase Linzess dosing to 290 mcg daily. Contniue Miralax and Senna Follow-up on pathology as comes available   LOS: 5 days    08/06/2021, 8:42 AM  Annitta Needs, PhD, ANP-BC [facility]

## 2021-08-07 ENCOUNTER — Encounter (HOSPITAL_COMMUNITY): Payer: Self-pay | Admitting: Internal Medicine

## 2021-08-07 DIAGNOSIS — K920 Hematemesis: Secondary | ICD-10-CM | POA: Diagnosis not present

## 2021-08-07 DIAGNOSIS — K5900 Constipation, unspecified: Secondary | ICD-10-CM

## 2021-08-07 DIAGNOSIS — K5909 Other constipation: Secondary | ICD-10-CM

## 2021-08-07 LAB — COMPREHENSIVE METABOLIC PANEL WITH GFR
ALT: 29 U/L (ref 0–44)
AST: 17 U/L (ref 15–41)
Albumin: 2.7 g/dL — ABNORMAL LOW (ref 3.5–5.0)
Alkaline Phosphatase: 67 U/L (ref 38–126)
Anion gap: 5 (ref 5–15)
BUN: 17 mg/dL (ref 8–23)
CO2: 22 mmol/L (ref 22–32)
Calcium: 10.1 mg/dL (ref 8.9–10.3)
Chloride: 108 mmol/L (ref 98–111)
Creatinine, Ser: 1.37 mg/dL — ABNORMAL HIGH (ref 0.44–1.00)
GFR, Estimated: 42 mL/min — ABNORMAL LOW
Glucose, Bld: 158 mg/dL — ABNORMAL HIGH (ref 70–99)
Potassium: 3.9 mmol/L (ref 3.5–5.1)
Sodium: 135 mmol/L (ref 135–145)
Total Bilirubin: 0.3 mg/dL (ref 0.3–1.2)
Total Protein: 5.5 g/dL — ABNORMAL LOW (ref 6.5–8.1)

## 2021-08-07 LAB — CBC
HCT: 35.5 % — ABNORMAL LOW (ref 36.0–46.0)
Hemoglobin: 12 g/dL (ref 12.0–15.0)
MCH: 32.2 pg (ref 26.0–34.0)
MCHC: 33.8 g/dL (ref 30.0–36.0)
MCV: 95.2 fL (ref 80.0–100.0)
Platelets: 175 10*3/uL (ref 150–400)
RBC: 3.73 MIL/uL — ABNORMAL LOW (ref 3.87–5.11)
RDW: 12.6 % (ref 11.5–15.5)
WBC: 10.2 10*3/uL (ref 4.0–10.5)
nRBC: 0 % (ref 0.0–0.2)

## 2021-08-07 LAB — SURGICAL PATHOLOGY

## 2021-08-07 MED ORDER — PANTOPRAZOLE SODIUM 40 MG PO TBEC: 40.0000 mg | DELAYED_RELEASE_TABLET | Freq: Two times a day (BID) | ORAL | 1 refills | Status: DC

## 2021-08-07 MED ORDER — SENNA 8.6 MG PO TABS: 2.0000 | ORAL_TABLET | Freq: Every day | ORAL | 0 refills | Status: DC

## 2021-08-07 MED ORDER — LINACLOTIDE 290 MCG PO CAPS: 290.0000 ug | ORAL_CAPSULE | Freq: Every day | ORAL | 1 refills | Status: DC

## 2021-08-07 MED ORDER — HYDROCODONE-ACETAMINOPHEN 5-325 MG PO TABS: 1.0000 | ORAL_TABLET | Freq: Four times a day (QID) | ORAL | 0 refills | Status: DC | PRN

## 2021-08-07 MED ORDER — POLYETHYLENE GLYCOL 3350 17 G PO PACK: 17.0000 g | PACK | Freq: Three times a day (TID) | ORAL | Status: DC

## 2021-08-07 MED ORDER — METOCLOPRAMIDE HCL 5 MG PO TABS: 5.0000 mg | ORAL_TABLET | Freq: Three times a day (TID) | ORAL | 1 refills | Status: DC

## 2021-08-07 MED ORDER — POLYETHYLENE GLYCOL 3350 17 G PO PACK: 17.0000 g | PACK | Freq: Two times a day (BID) | ORAL | 0 refills | Status: DC

## 2021-08-07 NOTE — Progress Notes (Signed)
    Subjective: Feels constipated. Poor appetite due to constipation. Small BM several days ago. No BM yesterday.   Objective: Vital signs in last 24 hours: Temp:  [97.8 F (36.6 C)-99.4 F (37.4 C)] 97.8 F (36.6 C) (08/30 0914) Pulse Rate:  [60-72] 64 (08/30 0914) Resp:  [16-19] 18 (08/30 0914) BP: (138-169)/(75-106) 169/106 (08/30 0914) SpO2:  [96 %-100 %] 100 % (08/30 0914) Last BM Date:  (pt states its been 12 days) General:   Alert and oriented, flat affect.  Head:  Normocephalic and atraumatic. Abdomen:  Bowel sounds present, soft, mildly TTP lower abdomen, non-distended. No HSM or hernias noted. No rebound or guarding. No masses appreciated  Rectal: small external hemorrhoids non-thrombosed. Lax sphincter tone. Soft stool in proximal vault with soft stool ball more distally unable to be reached. Unable to adequately remove stool with digital means.  Extremities:  Without edema. Neurologic:  Alert and  oriented x4  Intake/Output from previous day: 08/29 0701 - 08/30 0700 In: -  Out: 1000 [Urine:1000] Intake/Output this shift: No intake/output data recorded.  Lab Results: Recent Labs    08/05/21 0607 08/07/21 0558  WBC 15.9* 10.2  HGB 12.2 12.0  HCT 37.1 35.5*  PLT 194 175   BMET Recent Labs    08/05/21 0607 08/06/21 0547 08/07/21 0558  NA 141 137 135  K 4.3 3.7 3.9  CL 111 109 108  CO2 25 23 22   GLUCOSE 169* 163* 158*  BUN 26* 21 17  CREATININE 1.55* 1.48* 1.37*  CALCIUM 11.3* 10.5* 10.1   LFT Recent Labs    08/05/21 0607 08/06/21 0547 08/07/21 0558  PROT 6.1* 5.4* 5.5*  ALBUMIN 3.1* 2.7* 2.7*  AST 31 20 17   ALT 53* 38 29  ALKPHOS 68 67 67  BILITOT 0.7 0.5 0.3    Studies/Results: DG Abd Portable 2V  Result Date: 08/06/2021 CLINICAL DATA:  Abdominal pain and nausea for 1 week EXAM: PORTABLE ABDOMEN - 2 VIEW COMPARISON:  CT from 08/01/2021 FINDINGS: Scattered large and small bowel gas is noted. No obstructive changes are noted. No free air  is seen. Pacing device is noted. Degenerative changes of lumbar spine and hip joints are seen. IMPRESSION: No acute abnormality noted. Electronically Signed   By: Inez Catalina M.D.   On: 08/06/2021 14:20    Assessment: 70 year old female with multiple comorbidities and admitted with hematemesis and acute renal failure, s/p EGD 8/26 with severe reflux esophagitis, bile gastritis, felt to have delayed gastric emptying. Pathology with fibrinopurulent exudate and granulation tissue consistent with ulcer base. Reglan resumed on 8/26, as patient has noted improvement with N/V on the past.   Now with constipation despite increase of Linzess to 290 mcg yesterday, Miralax BID, and 2 Senna tablets daily. Personally performed rectal exam with soft stool in rectal vault and was unable to remove more distal stool higher in rectal vault. She reports taking enemas at home. Lax sphincter tone.    Need to continue PPI BID. No untoward side effects noted with Reglan. Due to severe esophagitis, will need early interval EGD in 3 months.   Plan: Tap water enema now PPI BID Continue Linzess, Miralax, and Senna Repeat EGD in 3 months  Annitta Needs, PhD, ANP-BC [facility]    LOS: 6 days    08/07/2021, 9:59 AM

## 2021-08-07 NOTE — Discharge Summary (Signed)
Physician Discharge Summary  Cerissa Zeiger TKZ:[id number] DOB: Jul 07, 1951 DOA: 08/01/2021  PCP[facility] date: 08/01/2021 Discharge date: 08/07/2021  Admitted From: Home Disposition:  Home   Recommendations for Outpatient Follow-up:  Follow up with PCP in 1-2 weeks Please obtain BMP/CBC in one week   Home Health: [facility]  Discharge Condition: Stable CODE STATUS:FULL Diet recommendation: Heart Healthy    Brief/Interim Summary: 70 year old female with a history of CKD stage III, coronary artery disease, sick sinus syndrome status post permanent pacemaker, primary hyperparathyroidism, diabetes mellitus with gastroparesis, and hypertension presenting with nausea and vomiting that began on 07/27/2021.  The patient has had vomiting on a daily basis.  However the patient's daughter noted that she began having hematemesis on the morning of 08/01/2021.  She described it as a 1-1/2 cups of blood.  As result, the patient was brought to the hospital for further evaluation.  The patient has not had any fevers, chills, chest pain, shortness of breath, palpitations, syncope.  She has not had any diarrhea, hematochezia, melena.  In fact, the patient has trouble with constipation.  There is not been any new medications.  The patient has not had any recent travels or eaten any undercooked or exotic foods.  She has not had any sick contacts.  Patient has been complaining of intermittent abdominal pain.  She has not been taking any NSAIDs. In the emergency department, the patient was afebrile and hemodynamically stable with oxygen saturation 96/97% on room air.  CT of the abdomen and pelvis showed stable CBD dilatation.  There were innumerable bilateral renal cysts without hydronephrosis.  There is moderate stool in the colon.  WBC was 27.9 with hemoglobin 16.0 and platelets 170,000.  BMP showed serum creatinine 2.46.  The patient was started on IV fluids.  GI was  consulted to assist with management.   Notably, the patient had a recent hospital admission from 06/12/2021 to 06/18/2021 at which time she was treated for hypercalcemia, AKI, and shaking spells.  Her hypercalcemia improved with IV fluids.  It was felt this was due to primary hyperparathyroidism.  She was seen by neurology and felt that shaking spells were due to her primary metabolic abnormalities.  EEG was negative.  She was discharged with instructions to follow-up with surgery for possible parathyroidectomy.  However, the patient's daughter states that they have been following with physician staff at pace he did not feel the patient was a good surgical candidate.  The patient was subsequently continued on Cinacalcet.  Discharge Diagnoses:   Hematemesis/Intractable vomiting -Suspect Mallory-Weiss tear -recurrent vomiting--multifactorial hypercalcemia in setting of DM gastroparesis and cannabis use -Appreciate GI consult>>planning EGD 8/26 -Patient has had numerous EGDs in the past for nausea and vomiting -October 2021 EGD--normal esophagus, erythematous mucosa in the antrum, normal duodenum.  Presence of bile reflux suggest gastric dysmotility or bile reflux gastritis--biopsies were negative for H. Pylori -Metoclopramide was discontinued last hospitalization was due to concerns for tardive dyskinesia -08/03/21 EGD--severe reflux esophagitis; bile gastritis -continue PPI bid -continue IVF during hospitalization -restart miralax and senna>>increased miralax bid -AM cortisol 24.2 -reglan IV added>>po--GI discussed risks, benefits of restarting this med>>daughter agrees to restart as benefit>risk -now tolerating  cardiac diet -avoid cannabis   Hypercalcemia/primary hyperparathyroidism -Corrected Ca = 12.0 at time of admission -8/26--Corrected Ca = 11.9>>11.5 -restart IVF -hypercalemia also exacerbating  emesis -will ultimately need surgical eval -change fluid to D51/2NS and increased  rate -given slow/poor response to IVF-->given pamidronate x 1  8/28 -corrected calcium 11.0 at time of d/c   Acute on Chronic renal failure--CKD 3 b -baseline creatinine 1.4-1.6 -presented with serum creatinine 2.46 -due to volume depletion -continue IVF>>improving -serum creatinine 1.37 on day of d/c   Constipation -multifactorial including opioids, hypercalcemia, inactivity -Linzess increased to 290 --restart miralax and senna>>increased miralax bid -given tap water enema and patient had BM prior to d/c   Chest pain-Atypical -related to severe reflux esophagitis -GI cocktail>>improved -personally reviewed EKG--sinus, nonspecific STT changes   Pyuria -UA = 11-20 WBC -follow urine culture>>less than 10K organisms -discontinue empiric ceftriaxone   Acute metabolic Encephalopathy -multifactorial including hypercalcemia, dehydration, AKI, possible UTI, hospital delirium -8/27--back to baseline   Tachybradysyndrome s/p PPM -follows Dr. Lovena Le   Hypertrophic Cardiomyopathy/elevated Troponin -8/24 limited echo EF 65-70; apical HCM -appreciate Dr. Harl Bowie input--has chronic ST elevation likely related to her apical hypertrophy -has had 2 prior catheterizations in the setting of vomiting with similar ST elevations-2019, 2021, both negative for significant disease. -Trop 140 > 109>>48   Hypertension -patient needs higher systolic blood pressure goal to reduce her risk of falls and syncope.  Patient's amlodipine and hydralazine were discontinued. 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 Andrey Spearman). Patient was continued on home aspirin 81mg .    Chronic diastolic CHF -2/11 echo EF 65-70%, G1DD -clinically hypovolemic presently    Discharge Instructions   Allergies as of 08/07/2021       Reactions   Contrast Media [iodinated Diagnostic Agents] Swelling   Contrast Dye - the one made out of shellfish, gives a really bad reaction - swelling   Oxycodone-acetaminophen Anaphylaxis   Xarelto  [rivaroxaban] Swelling   Oxycodone Other (See Comments)   Statused by Person:  EAST, CAMELIA(CE) on [id number]        Medication List     STOP taking these medications    amLODipine 10 MG tablet Commonly known as: NORVASC   Cholecalciferol 1.25 MG (50000 UT) capsule   HYDROcodone-Acetaminophen 5-300 MG Tabs Replaced by: HYDROcodone-acetaminophen 5-325 MG tablet   lisinopril 10 MG tablet Commonly known as: ZESTRIL   Tresiba FlexTouch 100 UNIT/ML FlexTouch Pen Generic drug: insulin degludec       TAKE these medications    (feeding supplement) PROSource Plus liquid Take 30 mLs by mouth daily.   acetaminophen 325 MG tablet Commonly known as: TYLENOL Take 650 mg by mouth 2 (two) times daily.   aspirin 81 MG chewable tablet Chew 1 tablet (81 mg total) by mouth daily.   carvedilol 6.25 MG tablet Commonly known as: COREG Take 6.25 mg by mouth 2 (two) times daily with a meal.   cinacalcet 30 MG tablet Commonly known as: SENSIPAR Take 1 tablet (30 mg total) by mouth 2 (two) times daily with a meal.   cyclobenzaprine 5 MG tablet Commonly known as: FLEXERIL Take 5 mg by mouth at bedtime.   docusate sodium 100 MG capsule Commonly known as: COLACE Take 100 mg by mouth 2 (two) times daily.   escitalopram 20 MG tablet Commonly known as: LEXAPRO Take 20 mg  by mouth daily.   famotidine 20 MG tablet Commonly known as: PEPCID Take 1 tablet (20 mg total) by mouth daily.    gabapentin 100 MG capsule Commonly known as: NEURONTIN Take 100 mg by mouth 3 (three) times daily.   HYDROcodone-acetaminophen 5-325 MG tablet Commonly known as: NORCO/VICODIN Take 1 tablet by mouth every 6 (six) hours as needed for moderate pain. Replaces: HYDROcodone-Acetaminophen 5-300 MG Tabs   linaclotide 290 MCG Caps capsule Commonly known as: LINZESS Take 1 capsule (290 mcg total) by mouth daily before breakfast. Start taking on: August 08, 2021   multivitamin with minerals Tabs tablet Take 1 tablet by mouth daily.   ondansetron 4 MG tablet Commonly known as: ZOFRAN Take 1 tablet (4 mg total) by mouth every 6 (six) hours.   oxybutynin 5 MG 24 hr tablet Commonly known as: DITROPAN-XL Take 5 mg by mouth daily.   pantoprazole 40 MG tablet Commonly known as: PROTONIX Take 1 tablet (40 mg total) by mouth 2 (two) times daily before a meal. What changed:  medication strength how much to take when to take this   polyethylene glycol 17 g packet Commonly known as: MIRALAX / GLYCOLAX Take 17 g by mouth 2 (two) times daily.   rosuvastatin 5 MG tablet Commonly known as: CRESTOR Take one tablet daily What changed:  how much to take how to take this when to take this additional instructions   senna 8.6 MG Tabs tablet Commonly known as: SENOKOT Take 2 tablets (17.2 mg total) by mouth daily. Start taking on: August 08, 2021   traZODone 50 MG tablet Commonly known as: DESYREL Take 50 mg by mouth at bedtime.        Allergies  Allergen Reactions   Contrast Media [Iodinated Diagnostic Agents] Swelling    Contrast Dye - the one made out of shellfish, gives a really bad reaction - swelling   Oxycodone-Acetaminophen Anaphylaxis   Xarelto  [Rivaroxaban] Swelling   Oxycodone Other (See Comments)    Statused by Person:  EAST, CAMELIA(CE) on [phone]    Consultations: GI   Procedures/Studies: CT ABDOMEN PELVIS WO CONTRAST  Result Date: 08/01/2021 CLINICAL  DATA:  Abdominal pain, nausea and vomiting for 3 days EXAM: CT ABDOMEN AND PELVIS WITHOUT CONTRAST TECHNIQUE: Multidetector CT imaging of the abdomen and pelvis was performed following the standard protocol without IV contrast. COMPARISON:  07/31/2021, 06/13/2021, 11/09/2020 FINDINGS: Lower chest: No acute pleural or parenchymal lung disease. Stable pacemaker. Unenhanced CT was performed per clinician order. Lack of IV contrast limits sensitivity and specificity, especially for evaluation of abdominal/pelvic solid viscera. Hepatobiliary: No focal liver abnormality is seen. Status post cholecystectomy. Stable postsurgical dilatation of the common bile duct. Pancreas: Unremarkable. No pancreatic ductal dilatation or surrounding inflammatory changes. Spleen: Normal in size without focal abnormality. Adrenals/Urinary Tract: Innumerable bilateral renal cortical cysts are identified, some displaying thin peripheral calcifications. Evaluation is incomplete on unenhanced exam, but there is no gross change since the preceding study. No urinary tract calculi or obstructive uropathy. The adrenals and bladder are stable. Stomach/Bowel: No bowel obstruction or ileus. Moderate retained stool throughout the colon. The appendix, if still present, is not well visualized. No bowel wall thickening or inflammatory change. Vascular/Lymphatic: Aortic atherosclerosis. No enlarged abdominal or pelvic lymph nodes. Reproductive: Status post hysterectomy. No adnexal masses. Other: No free fluid or free gas.  No abdominal wall hernia. Musculoskeletal: There are no acute or destructive bony lesions. Stable degenerative changes of the lumbar spine and bilateral hips. Reconstructed images  demonstrate no additional findings. IMPRESSION: 1. No acute intra-abdominal or intrapelvic process. 2. Stable mildly complex bilateral renal cysts, incompletely evaluated by unenhanced exam. 3. No bowel obstruction or ileus. Moderate residual stool throughout  the colon. 4.  Aortic Atherosclerosis (ICD10-I70.0). Electronically Signed   By: Randa Ngo M.D.   On: 08/01/2021 17:01   DG Abd 1 View  Result Date: 08/01/2021 CLINICAL DATA:  Abdominal pain, nausea, constipation EXAM: ABDOMEN - 1 VIEW COMPARISON:  KUB 06/14/2021 FINDINGS: There is a nonobstructive bowel gas pattern. There is a moderate colonic stool burden projecting over the rectum and right hemiabdomen. There is no gross organomegaly or abnormal soft tissue calcification. There is no acute osseous abnormality. IMPRESSION: Moderate colonic stool burden as above without evidence of mechanical obstruction. Electronically Signed   By: Valetta Mole M.D.   On: 08/01/2021 13:39   DG Chest Port 1 View  Result Date: 08/01/2021 CLINICAL DATA:  Weakness.  Abdominal pain.  Nausea and vomiting. EXAM: PORTABLE CHEST 1 VIEW COMPARISON:  CT chest 06/13/2021 FINDINGS: Heart size is normal. Lungs are clear. No focal nodule, mass, or airspace disease is present. No edema or effusion is present. Pacemaker wires are stable. IMPRESSION: No acute cardiopulmonary disease or significant interval change. Electronically Signed   By: San Morelle M.D.   On: 08/01/2021 15:08   DG Abd Portable 2V  Result Date: 08/06/2021 CLINICAL DATA:  Abdominal pain and nausea for 1 week EXAM: PORTABLE ABDOMEN - 2 VIEW COMPARISON:  CT from 08/01/2021 FINDINGS: Scattered large and small bowel gas is noted. No obstructive changes are noted. No free air is seen. Pacing device is noted. Degenerative changes of lumbar spine and hip joints are seen. IMPRESSION: No acute abnormality noted. Electronically Signed   By: Inez Catalina M.D.   On: 08/06/2021 14:20   CUP PACEART REMOTE DEVICE CHECK  Result Date: 07/12/2021 Scheduled remote reviewed. Normal device function.  Next remote in 91 days. Kathy Breach, RN, CCDS, [employer]  ECHOCARDIOGRAM LIMITED  Result Date: 08/01/2021    ECHOCARDIOGRAM LIMITED REPORT   Patient Name:    LORIN HAUCK Date of Exam: 08/01/2021 Medical Rec #:  [id number]        Height:       64.0 in Accession #:    [id number]       Weight:       144.0 lb Date of Birth:  07-Feb-1951        BSA:          1.701 m Patient Age:    80 years         BP:           188/104 mmHg Patient Gender: F                HR:           68 bpm. Exam Location:  [facility] Procedure: 2D Echo, Cardiac Doppler and Color Doppler Indications:    Abnormal ECG R94.31  History:        Patient has prior history of Echocardiogram examinations, most                 recent 06/13/2021. Hypertrophic Cardiomyopathy, CAD and STEMI (ST                 elevation myocardial infarction), Pacemaker; Risk                 Factors:Hypertension and Diabetes. Sick sinus syndrome ([place])                 (  From Hx).  Sonographer:    Alvino Chapel RCS Referring Phys: [id number] [place]  1. There is moderate asymmsetric septal hypertrophy. The apex is not completely visualized but appears to have severe apical hypertrophy with systolic apical obliteration but no significant intracavitary gradient. Findings consistent with apical hypertrophic cardiomyopathy. . Left ventricular ejection fraction, by estimation, is 65 to 70%. The left ventricle has normal function. The left ventricle has no regional wall motion abnormalities. Left ventricular diastolic parameters are consistent with Grade I diastolic dysfunction (impaired relaxation).  2. Right ventricular systolic function is normal. The right ventricular size is normal.  3. Limited study to evaluate LV function FINDINGS  Left Ventricle: There is moderate asymmsetric septal hypertrophy. The apex is not completely visualized but appears to have severe apical hypertrophy with systolic apical obliteration but no significant intracavitary gradient. Findings consistent with apical hypertrophic cardiomyopathy. Left ventricular ejection fraction, by estimation, is 65 to 70%. The left ventricle has normal function.  The left ventricle has no regional wall motion abnormalities. Left ventricular diastolic parameters are consistent with Grade I diastolic dysfunction (impaired relaxation). Right Ventricle: The right ventricular size is normal. No increase in right ventricular wall thickness. Right ventricular systolic function is normal. LEFT VENTRICLE PLAX 2D LVIDd:         4.07 cm  Diastology LVIDs:         2.71 cm  LV e' lateral:   4.82 cm/s LV PW:         1.09 cm  LV E/e' lateral: 10.6 LV IVS:        1.46 cm LVOT diam:     2.30 cm LV SV:         86 LV SV Index:   51 LVOT Area:     4.15 cm  RIGHT VENTRICLE RV S prime:     12.90 cm/s TAPSE (M-mode): 1.5 cm LEFT ATRIUM           Index       RIGHT ATRIUM           Index LA diam:      3.20 cm 1.88 cm/m  RA Area:     11.50 cm LA Vol (A4C): 29.9 ml 17.58 ml/m RA Volume:   26.20 ml  15.40 ml/m  AORTIC VALVE LVOT Vmax:   120.00 cm/s LVOT Vmean:  76.100 cm/s LVOT VTI:    0.208 m  AORTA Ao Root diam: 3.60 cm MITRAL VALVE MV Area (PHT): 3.15 cm     SHUNTS MV Decel Time: 241 msec     Systemic VTI:  0.21 m MV E velocity: 51.00 cm/s   Systemic Diam: 2.30 cm MV A velocity: 115.00 cm/s MV E/A ratio:  0.44 Carlyle Dolly MD Electronically signed by Carlyle Dolly MD Signature Date/Time: 08/01/2021/3:51:22 PM    Final         Discharge Exam: Vitals:   08/07/21 0914 08/07/21 1437  BP: (!) 169/106 (!) 146/94  Pulse: 64 60  Resp: 18 18  Temp: 97.8 F (36.6 C) 98.3 F (36.8 C)  SpO2: 100% 100%   Vitals:   08/06/21 2127 08/07/21 0401 08/07/21 0914 08/07/21 1437  BP: (!) 150/89 (!) 169/96 (!) 169/106 (!) 146/94  Pulse: 60 60 64 60  Resp: 18 19 18 18   Temp: 98.1 F (36.7 C) 99.4 F (37.4 C) 97.8 F (36.6 C) 98.3 F (36.8 C)  TempSrc:  Oral Oral Oral  SpO2: 96% 99% 100% 100%  Weight:  Height:        General: Pt is alert, awake, not in acute distress Cardiovascular: RRR, S1/S2 +, no rubs, no gallops Respiratory: CTA bilaterally, no wheezing, no  rhonchi Abdominal: Soft, NT, ND, bowel sounds + Extremities: no edema, no cyanosis   The results of significant diagnostics from this hospitalization (including imaging, microbiology, ancillary and laboratory) are listed below for reference.    Significant Diagnostic Studies: CT ABDOMEN PELVIS WO CONTRAST  Result Date: 08/01/2021 CLINICAL DATA:  Abdominal pain, nausea and vomiting for 3 days EXAM: CT ABDOMEN AND PELVIS WITHOUT CONTRAST TECHNIQUE: Multidetector CT imaging of the abdomen and pelvis was performed following the standard protocol without IV contrast. COMPARISON:  07/31/2021, 06/13/2021, 11/09/2020 FINDINGS: Lower chest: No acute pleural or parenchymal lung disease. Stable pacemaker. Unenhanced CT was performed per clinician order. Lack of IV contrast limits sensitivity and specificity, especially for evaluation of abdominal/pelvic solid viscera. Hepatobiliary: No focal liver abnormality is seen. Status post cholecystectomy. Stable postsurgical dilatation of the common bile duct. Pancreas: Unremarkable. No pancreatic ductal dilatation or surrounding inflammatory changes. Spleen: Normal in size without focal abnormality. Adrenals/Urinary Tract: Innumerable bilateral renal cortical cysts are identified, some displaying thin peripheral calcifications. Evaluation is incomplete on unenhanced exam, but there is no gross change since the preceding study. No urinary tract calculi or obstructive uropathy. The adrenals and bladder are stable. Stomach/Bowel: No bowel obstruction or ileus. Moderate retained stool throughout the colon. The appendix, if still present, is not well visualized. No bowel wall thickening or inflammatory change. Vascular/Lymphatic: Aortic atherosclerosis. No enlarged abdominal or pelvic lymph nodes. Reproductive: Status post hysterectomy. No adnexal masses. Other: No free fluid or free gas.  No abdominal wall hernia. Musculoskeletal: There are no acute or destructive bony lesions.  Stable degenerative changes of the lumbar spine and bilateral hips. Reconstructed images demonstrate no additional findings. IMPRESSION: 1. No acute intra-abdominal or intrapelvic process. 2. Stable mildly complex bilateral renal cysts, incompletely evaluated by unenhanced exam. 3. No bowel obstruction or ileus. Moderate residual stool throughout the colon. 4.  Aortic Atherosclerosis (ICD10-I70.0). Electronically Signed   By: Randa Ngo M.D.   On: 08/01/2021 17:01   DG Abd 1 View  Result Date: 08/01/2021 CLINICAL DATA:  Abdominal pain, nausea, constipation EXAM: ABDOMEN - 1 VIEW COMPARISON:  KUB 06/14/2021 FINDINGS: There is a nonobstructive bowel gas pattern. There is a moderate colonic stool burden projecting over the rectum and right hemiabdomen. There is no gross organomegaly or abnormal soft tissue calcification. There is no acute osseous abnormality. IMPRESSION: Moderate colonic stool burden as above without evidence of mechanical obstruction. Electronically Signed   By: Valetta Mole M.D.   On: 08/01/2021 13:39   DG Chest Port 1 View  Result Date: 08/01/2021 CLINICAL DATA:  Weakness.  Abdominal pain.  Nausea and vomiting. EXAM: PORTABLE CHEST 1 VIEW COMPARISON:  CT chest 06/13/2021 FINDINGS: Heart size is normal. Lungs are clear. No focal nodule, mass, or airspace disease is present. No edema or effusion is present. Pacemaker wires are stable. IMPRESSION: No acute cardiopulmonary disease or significant interval change. Electronically Signed   By: San Morelle M.D.   On: 08/01/2021 15:08   DG Abd Portable 2V  Result Date: 08/06/2021 CLINICAL DATA:  Abdominal pain and nausea for 1 week EXAM: PORTABLE ABDOMEN - 2 VIEW COMPARISON:  CT from 08/01/2021 FINDINGS: Scattered large and small bowel gas is noted. No obstructive changes are noted. No free air is seen. Pacing device is noted. Degenerative changes of lumbar spine and  hip joints are seen. IMPRESSION: No acute abnormality noted.  Electronically Signed   By: Inez Catalina M.D.   On: 08/06/2021 14:20   CUP PACEART REMOTE DEVICE CHECK  Result Date: 07/12/2021 Scheduled remote reviewed. Normal device function.  Next remote in 91 days. Kathy Breach, RN, CCDS, [employer]  ECHOCARDIOGRAM LIMITED  Result Date: 08/01/2021    ECHOCARDIOGRAM LIMITED REPORT   Patient Name:   KARYS MECKLEY Date of Exam: 08/01/2021 Medical Rec #:  [id number]        Height:       64.0 in Accession #:    [id number]       Weight:       144.0 lb Date of Birth:  23-Jan-1951        BSA:          1.701 m Patient Age:    19 years         BP:           188/104 mmHg Patient Gender: F                HR:           68 bpm. Exam Location:  [facility] Procedure: 2D Echo, Cardiac Doppler and Color Doppler Indications:    Abnormal ECG R94.31  History:        Patient has prior history of Echocardiogram examinations, most                 recent 06/13/2021. Hypertrophic Cardiomyopathy, CAD and STEMI (ST                 elevation myocardial infarction), Pacemaker; Risk                 Factors:Hypertension and Diabetes. Sick sinus syndrome ([facility])                 (From Hx).  Sonographer:    Alvino Chapel RCS Referring Phys: [id number] [place]  1. There is moderate asymmsetric septal hypertrophy. The apex is not completely visualized but appears to have severe apical hypertrophy with systolic apical obliteration but no significant intracavitary gradient. Findings consistent with apical hypertrophic cardiomyopathy. . Left ventricular ejection fraction, by estimation, is 65 to 70%. The left ventricle has normal function. The left ventricle has no regional wall motion abnormalities. Left ventricular diastolic parameters are consistent with Grade I diastolic dysfunction (impaired relaxation).  2. Right ventricular systolic function is normal. The right ventricular size is normal.  3. Limited study to evaluate LV function FINDINGS  Left Ventricle: There is moderate  asymmsetric septal hypertrophy. The apex is not completely visualized but appears to have severe apical hypertrophy with systolic apical obliteration but no significant intracavitary gradient. Findings consistent with apical hypertrophic cardiomyopathy. Left ventricular ejection fraction, by estimation, is 65 to 70%. The left ventricle has normal function. The left ventricle has no regional wall motion abnormalities. Left ventricular diastolic parameters are consistent with Grade I diastolic dysfunction (impaired relaxation). Right Ventricle: The right ventricular size is normal. No increase in right ventricular wall thickness. Right ventricular systolic function is normal. LEFT VENTRICLE PLAX 2D LVIDd:         4.07 cm  Diastology LVIDs:         2.71 cm  LV e' lateral:   4.82 cm/s LV PW:         1.09 cm  LV E/e' lateral: 10.6 LV IVS:  1.46 cm LVOT diam:     2.30 cm LV SV:         86 LV SV Index:   51 LVOT Area:     4.15 cm  RIGHT VENTRICLE RV S prime:     12.90 cm/s TAPSE (M-mode): 1.5 cm LEFT ATRIUM           Index       RIGHT ATRIUM           Index LA diam:      3.20 cm 1.88 cm/m  RA Area:     11.50 cm LA Vol (A4C): 29.9 ml 17.58 ml/m RA Volume:   26.20 ml  15.40 ml/m  AORTIC VALVE LVOT Vmax:   120.00 cm/s LVOT Vmean:  76.100 cm/s LVOT VTI:    0.208 m  AORTA Ao Root diam: 3.60 cm MITRAL VALVE MV Area (PHT): 3.15 cm     SHUNTS MV Decel Time: 241 msec     Systemic VTI:  0.21 m MV E velocity: 51.00 cm/s   Systemic Diam: 2.30 cm MV A velocity: 115.00 cm/s MV E/A ratio:  0.44 Carlyle Dolly MD Electronically signed by Carlyle Dolly MD Signature Date/Time: 08/01/2021/3:51:22 PM    Final     Microbiology: Recent Results (from the past 240 hour(s))  Resp Panel by RT-PCR (Flu A&B, Covid) Nasopharyngeal Swab     Status: None   Collection Time: 08/01/21  2:17 PM   Specimen: Nasopharyngeal Swab; Nasopharyngeal(NP) swabs in vial transport medium  Result Value Ref Range Status   SARS Coronavirus 2 by RT  PCR NEGATIVE NEGATIVE Final    Comment: (NOTE) SARS-CoV-2 target nucleic acids are NOT DETECTED.  The SARS-CoV-2 RNA is generally detectable in upper respiratory specimens during the acute phase of infection. The lowest concentration of SARS-CoV-2 viral copies this assay can detect is 138 copies/mL. A negative result does not preclude SARS-Cov-2 infection and should not be used as the sole basis for treatment or other patient management decisions. A negative result may occur with  improper specimen collection/handling, submission of specimen other than nasopharyngeal swab, presence of viral mutation(s) within the areas targeted by this assay, and inadequate number of viral copies(<138 copies/mL). A negative result must be combined with clinical observations, patient history, and epidemiological information. The expected result is Negative.  Fact Sheet for Patients:  EntrepreneurPulse.com.au  Fact Sheet for Healthcare Providers:  IncredibleEmployment.be  This test is no t yet approved or cleared by the [place] FDA and  has been authorized for detection and/or diagnosis of SARS-CoV-2 by FDA under an Emergency Use Authorization (EUA). This EUA will remain  in effect (meaning this test can be used) for the duration of the COVID-19 declaration under Section 564(b)(1) of the Act, 21 U.S.C.section 360bbb-3(b)(1), unless the authorization is terminated  or revoked sooner.       Influenza A by PCR NEGATIVE NEGATIVE Final   Influenza B by PCR NEGATIVE NEGATIVE Final    Comment: (NOTE) The Xpert Xpress SARS-CoV-2/FLU/RSV plus assay is intended as an aid in the diagnosis of influenza from Nasopharyngeal swab specimens and should not be used as a sole basis for treatment. Nasal washings and aspirates are unacceptable for Xpert Xpress SARS-CoV-2/FLU/RSV testing.  Fact Sheet for Patients: EntrepreneurPulse.com.au  Fact Sheet for  Healthcare Providers: IncredibleEmployment.be  This test is not yet approved or cleared by the [place] FDA and has been authorized for detection and/or diagnosis of SARS-CoV-2 by FDA under an Emergency Use Authorization (EUA). This  EUA will remain in effect (meaning this test can be used) for the duration of the COVID-19 declaration under Section 564(b)(1) of the Act, 21 U.S.C. section 360bbb-3(b)(1), unless the authorization is terminated or revoked.  Performed at [facility], [address], [place], [place] [address]   MRSA Next Gen by PCR, Nasal     Status: None   Collection Time: 08/01/21 10:11 PM   Specimen: Nasal Mucosa; Nasal Swab  Result Value Ref Range Status   MRSA by PCR Next Gen NOT DETECTED NOT DETECTED Final    Comment: (NOTE) The GeneXpert MRSA Assay (FDA approved for NASAL specimens only), is one component of a comprehensive MRSA colonization surveillance program. It is not intended to diagnose MRSA infection nor to guide or monitor treatment for MRSA infections. Test performance is not FDA approved in patients less than 12 years old. Performed at [facility], [address], [place], [place] [address]   Urine Culture     Status: Abnormal   Collection Time: 08/02/21  4:50 PM   Specimen: Urine, Clean Catch  Result Value Ref Range Status   Specimen Description   Final    URINE, CLEAN CATCH Performed at [facility], [address] [place], [place] [address]    Special Requests   Final    NONE Performed at [facility], [address], [place], [place] [address]    Culture (A)  Final    <10,000 COLONIES/mL INSIGNIFICANT GROWTH Performed at [facility], [place] [address] [place], [place] [address]    Report Status 08/04/2021 FINAL  Final     Labs: Basic Metabolic Panel: Recent Labs  Lab 08/01/21 1728 08/02/21 0500 08/03/21 0502 08/04/21 0448 08/05/21 0086 08/06/21 0547 08/07/21 0558  NA  --    < > 146* 147* 141 137 135   K  --    < > 3.4* 3.3* 4.3 3.7 3.9  CL  --    < > 113* 114* 111 109 108  CO2  --    < > 26 25 25 23 22   GLUCOSE  --    < > 120* 133* 169* 163* 158*  BUN  --    < > 48* 41* 26* 21 17  CREATININE  --    < > 2.09* 1.76* 1.55* 1.48* 1.37*  CALCIUM  --    < > 11.6* 11.7* 11.3* 10.5* 10.1  MG 2.4  --   --  2.2  --   --   --    < > = values in this interval not displayed.   Liver Function Tests: Recent Labs  Lab 08/03/21 0502 08/04/21 0448 08/05/21 0607 08/06/21 0547 08/07/21 0558  AST 31 37 31 20 17   ALT 36 53* 53* 38 29  ALKPHOS 69 71 68 67 67  BILITOT 0.9 0.9 0.7 0.5 0.3  PROT 6.2* 6.2* 6.1* 5.4* 5.5*  ALBUMIN 3.3* 3.2* 3.1* 2.7* 2.7*   No results for input(s): LIPASE, AMYLASE in the last 168 hours. No results for input(s): AMMONIA in the last 168 hours. CBC: Recent Labs  Lab 08/01/21 1254 08/01/21 1304 08/02/21 0500 08/03/21 0502 08/04/21 0448 08/05/21 0607 08/07/21 0558  WBC 27.9*   < > 20.6* 19.1* 17.7* 15.9* 10.2  NEUTROABS 23.3*  --   --   --   --   --   --   HGB 16.0*   < > 13.7 12.6 12.9 12.2 12.0  HCT 46.5*   < > 40.4 38.8 39.3 37.1  35.5*  MCV 93.0   < > 94.6 97.5 95.9 97.1 95.2  PLT 227   < > 170 175 195 194 175   < > = values in this interval not displayed.   Cardiac Enzymes: No results for input(s): CKTOTAL, CKMB, CKMBINDEX, TROPONINI in the last 168 hours. BNP: Invalid input(s): POCBNP CBG: Recent Labs  Lab 08/01/21 2244 08/02/21 0541 08/02/21 1112  GLUCAP 125* 114* 118*    Time coordinating discharge:  36 minutes  Signed:  Orson Eva, DO [facility] Pager: [phone] 08/07/2021, 3:14 PM

## 2021-08-07 NOTE — Plan of Care (Signed)

## 2021-08-07 NOTE — Care Management Important Message (Signed)
Important Message  Patient Details  Name: Melissa Lopez MRN: [id number] Date of Birth: 1951/01/01   Medicare Important Message Given:  Yes     Tommy Medal 08/07/2021, 1:32 PM

## 2021-08-07 NOTE — Progress Notes (Signed)
Phlebotomist unable to obtain morning labs on first attempt, patient refused second attempt at this time.

## 2021-08-08 ENCOUNTER — Encounter: Payer: Self-pay | Admitting: Internal Medicine

## 2021-08-29 NOTE — Telephone Encounter (Signed)
Afib in the setting of GI illness.  We will follow AF burden and consider [place] if AF burden increases.

## 2021-09-24 ENCOUNTER — Ambulatory Visit: Payer: Medicaid Other | Admitting: Diagnostic Neuroimaging

## 2021-09-24 ENCOUNTER — Encounter: Payer: Self-pay | Admitting: Diagnostic Neuroimaging

## 2021-09-28 ENCOUNTER — Telehealth: Payer: Self-pay

## 2021-09-28 NOTE — Telephone Encounter (Signed)
2nd attempt to contact to set up apt from referral

## 2021-10-01 ENCOUNTER — Ambulatory Visit: Payer: Medicaid Other | Admitting: Diagnostic Neuroimaging

## 2021-10-11 ENCOUNTER — Ambulatory Visit (INDEPENDENT_AMBULATORY_CARE_PROVIDER_SITE_OTHER): Payer: Medicare (Managed Care)

## 2021-10-11 DIAGNOSIS — I495 Sick sinus syndrome: Secondary | ICD-10-CM

## 2021-10-11 LAB — CUP PACEART REMOTE DEVICE CHECK
Battery Remaining Longevity: 90 mo
Battery Remaining Percentage: 71 %
Battery Voltage: 3.01 V
Brady Statistic AP VP Percent: 19 %
Brady Statistic AP VS Percent: 63 %
Brady Statistic AS VP Percent: 1 %
Brady Statistic AS VS Percent: 16 %
Brady Statistic RA Percent Paced: 81 %
Brady Statistic RV Percent Paced: 20 %
Date Time Interrogation Session: 20221103020013
Implantable Lead Implant Date: 20110113
Implantable Lead Implant Date: 20110113
Implantable Lead Location: 753859
Implantable Lead Location: 753860
Implantable Pulse Generator Implant Date: 20191008
Lead Channel Impedance Value: 450 Ohm
Lead Channel Impedance Value: 630 Ohm
Lead Channel Pacing Threshold Amplitude: 0.5 V
Lead Channel Pacing Threshold Amplitude: 0.75 V
Lead Channel Pacing Threshold Pulse Width: 0.4 ms
Lead Channel Pacing Threshold Pulse Width: 0.4 ms
Lead Channel Sensing Intrinsic Amplitude: 12 mV
Lead Channel Sensing Intrinsic Amplitude: 3.9 mV
Lead Channel Setting Pacing Amplitude: 1 V
Lead Channel Setting Pacing Amplitude: 1.5 V
Lead Channel Setting Pacing Pulse Width: 0.4 ms
Lead Channel Setting Sensing Sensitivity: 2 mV
Pulse Gen Model: 2272
Pulse Gen Serial Number: 9064940

## 2021-10-15 ENCOUNTER — Ambulatory Visit (INDEPENDENT_AMBULATORY_CARE_PROVIDER_SITE_OTHER): Payer: Medicare (Managed Care) | Admitting: Orthopedic Surgery

## 2021-10-15 ENCOUNTER — Other Ambulatory Visit: Payer: Self-pay

## 2021-10-15 ENCOUNTER — Ambulatory Visit (INDEPENDENT_AMBULATORY_CARE_PROVIDER_SITE_OTHER): Payer: Medicare (Managed Care)

## 2021-10-15 ENCOUNTER — Ambulatory Visit: Payer: Self-pay

## 2021-10-15 DIAGNOSIS — M25562 Pain in left knee: Secondary | ICD-10-CM | POA: Diagnosis not present

## 2021-10-15 DIAGNOSIS — M545 Low back pain, unspecified: Secondary | ICD-10-CM | POA: Diagnosis not present

## 2021-10-15 DIAGNOSIS — M25561 Pain in right knee: Secondary | ICD-10-CM

## 2021-10-17 NOTE — Progress Notes (Signed)
Remote pacemaker transmission.   

## 2021-10-21 ENCOUNTER — Encounter: Payer: Self-pay | Admitting: Orthopedic Surgery

## 2021-10-21 NOTE — Progress Notes (Signed)
Office Visit Note   Patient: Melissa Lopez           Date of Birth: 05-17-1951           MRN: [id number] Visit Date: 10/15/2021 Requested by: [facility], [place],  [place] [address] PCP: [facility], [place]  Subjective: Chief Complaint  Patient presents with   Right Knee - New Patient (Initial Visit)   Left Knee - New Patient (Initial Visit)    HPI: Melissa Lopez is a 70 year old patient with bilateral knee pain and left "hip" pain.  Left knee has become worse.  Pain radiates from the buttock down the posterior aspect of her leg to the left knee and below.  She had radiographs a month ago.  Hard for her to walk.  No history of lumbar spine surgery.  She does use a cane.  Buttock pain is worse with standing.  Takes Tylenol for pain.  That does not work very well.  She lives in [place].  Denies any groin symptoms.  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  No cervical or thoracic spine stenosis.              ROS: All systems reviewed are negative as they relate to the chief complaint within the history of present illness.  Patient denies  fevers or chills.   Assessment & Plan: Visit Diagnoses:  1. Right knee pain, unspecified chronicity   2. Left knee pain, unspecified chronicity   3. 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  Failure conservative management.  Would do well with consultation with Dr. Ernestina Patches for L spine ESI for left-sided radiculopathy.  Follow-up after that with him as needed and with [place] as needed.  She may have a surgical problem in the back which she needs to have referral for.  Follow-Up Instructions: No follow-ups on file.   Orders:  Orders Placed This Encounter  Procedures   XR KNEE 3 VIEW RIGHT   XR KNEE 3 VIEW LEFT   Ambulatory  referral to Physical Medicine Rehab   No orders of the defined types were placed in this encounter.     Procedures: No procedures performed   Clinical Data: No additional findings.  Objective: Vital Signs: There were no vitals taken for this visit.  Physical Exam:   Constitutional: Patient appears well-developed HEENT:  Head: Normocephalic Eyes:EOM are normal Neck: Normal range of motion Cardiovascular: Normal rate Pulmonary/chest: Effort normal Neurologic: Patient is alert Skin: Skin is warm Psychiatric: Patient has normal mood and affect   Ortho Exam: Ortho exam demonstrates palpable pedal pulses with symmetric reflexes 1+ out of 4 bilateral patella and Achilles.  Negative clonus.  No nerve root tension signs today.  No groin pain on the left the right-hand side with internal or external rotation of the leg.  No other masses lymphadenopathy or skin changes noted in the leg region.  Prior knee replacement incisions noted on both knees with no effusion in the knees.  Extensor mechanism intact.  No leg atrophy left versus right.  Specialty Comments:  No specialty comments available.  Imaging: No results found.   PMFS History: Patient Active Problem List   Diagnosis Date Noted   Constipation    Abdominal pain    Acute renal  failure superimposed on stage 3b chronic kidney disease ([place]) 08/02/2021   Hematemesis 08/01/2021   Orthostasis    Seizure-like activity ([place]) 06/13/2021   Loss of weight    Primary hypertension    Chest pain of uncertain etiology 50/93/2671   Primary hyperparathyroidism ([place]) 09/25/2020   Emesis    Hypophosphatemia    Hypokalemia 09/22/2020   Chronic diastolic CHF (congestive heart failure) ([place]) 09/22/2020   Noninfective gastroenteritis and colitis, unspecified 04/13/2020   Unilateral primary osteoarthritis, right knee 04/13/2020   Arthritis 04/13/2020   Bradycardia 04/13/2020   Cardiomyopathy, 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[place]) 04/13/2020   Class 1  obesity due to excess calories with body mass index (BMI) of 34.0 to 34.9 in adult 04/13/2020   Acute kidney failure, unspecified ([place]) 04/29/2019   Intractable nausea and vomiting 11/10/2018   Malnutrition of moderate degree 11/09/2018   AKI (acute kidney injury) ([place])    PE (pulmonary thromboembolism) ([place])    H/O cardiac pacemaker 10/31/2018   Syncope 10/31/2018   ST elevation on EKG without MI 10/31/2018   Chest pain with high risk of acute coronary syndrome 10/31/2018   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[place]) 10/31/2018   Type 2 diabetes mellitus with complication, with long-term current use of insulin ([place]) 10/31/2018   CKD (chronic kidney disease) stage 3, GFR 30-59 ml/min (HCC) - per patient report 10/31/2018   Presence of cardiac pacemaker 10/31/2018   Essential hypertension    Hypercalcemia 07/27/2015   Past Medical History:  Diagnosis Date   CKD (chronic kidney disease) stage 3, GFR 30-59 ml/min ([place]) 10/31/2018   - per patient report   Coronary artery disease    per patient report - 2 caths with non-obstructive CAD   Essential hypertension    Frequent falls    H/O cardiac pacemaker 10/31/2018   implanted 05/2010   Sick sinus syndrome ([place])    Type 2 diabetes mellitus with complication, with long-term current use of insulin ([place]) 10/31/2018   with diabetic nephropathy    Family History  Problem Relation Age of Onset   Breast cancer Mother    Bone cancer Mother    Heart attack Father 41   Breast cancer Sister    Heart disease Brother        stents   Other Sister        MVA   Diabetes Brother     Past Surgical History:  Procedure Laterality Date   BIOPSY  09/28/2020   Procedure: BIOPSY;  Surgeon: Ronald Lobo, MD;  Location: [facility];  Service: Endoscopy;;   BIOPSY  08/03/2021   Procedure: BIOPSY;  Surgeon: Daneil Dolin, MD;  [facility];  Service: Endoscopy;;   CARDIAC PACEMAKER PLACEMENT  2008   in [place] for sick  sinus syndrome   CHOLECYSTECTOMY     CORONARY/GRAFT ACUTE MI REVASCULARIZATION N/A 11/09/2020   Procedure: CORONARY/GRAFT ACUTE MI REVASCULARIZATION;  Surgeon: Leonie Man, MD;  Location: [facility];  Service: Cardiovascular;  Laterality: N/A;   ESOPHAGOGASTRODUODENOSCOPY N/A 09/28/2020   Procedure: ESOPHAGOGASTRODUODENOSCOPY (EGD);  Surgeon: Ronald Lobo, MD;  Location: [facility];  Service: Endoscopy;  Laterality: N/A;   ESOPHAGOGASTRODUODENOSCOPY (EGD) WITH PROPOFOL N/A 11/10/2018   Procedure: ESOPHAGOGASTRODUODENOSCOPY (EGD) WITH PROPOFOL;  Surgeon: Wilford Corner, MD;  Location: [facility];  Service: Endoscopy;  Laterality: N/A;   ESOPHAGOGASTRODUODENOSCOPY (EGD) WITH PROPOFOL N/A 08/03/2021   Procedure: ESOPHAGOGASTRODUODENOSCOPY (EGD) WITH PROPOFOL;  Surgeon: Daneil Dolin, MD;  [facility];  Service: Endoscopy;  Laterality: N/A;   LEFT HEART CATH AND CORONARY ANGIOGRAPHY N/A 10/31/2018   Procedure: LEFT HEART CATH AND CORONARY ANGIOGRAPHY;  Surgeon: Leonie Man, MD;  Location: [facility];  Service: Cardiovascular;  Laterality: N/A;   PACEMAKER GENERATOR CHANGE  09/2018   in [place]   REPLACEMENT TOTAL KNEE BILATERAL     Social History   Occupational History    Comment: retired  Tobacco Use   Smoking status: Former    Years: 3.00    Types: Cigarettes    Quit date: 12/09/1969    Years since quitting: 51.9   Smokeless tobacco: Never  Vaping Use   Vaping Use: Never used  Substance and Sexual Activity   Alcohol use: Not Currently   Drug use: Not Currently    Comment: as teenager   Sexual activity: Not on file

## 2021-11-22 ENCOUNTER — Other Ambulatory Visit: Payer: Self-pay

## 2021-11-22 ENCOUNTER — Encounter: Payer: Self-pay | Admitting: Physical Medicine and Rehabilitation

## 2021-11-22 ENCOUNTER — Ambulatory Visit (INDEPENDENT_AMBULATORY_CARE_PROVIDER_SITE_OTHER): Payer: Medicare (Managed Care) | Admitting: Physical Medicine and Rehabilitation

## 2021-11-22 ENCOUNTER — Ambulatory Visit: Payer: Self-pay

## 2021-11-22 VITALS — BP 119/76 | HR 60

## 2021-11-22 DIAGNOSIS — M5416 Radiculopathy, lumbar region: Secondary | ICD-10-CM | POA: Diagnosis not present

## 2021-11-22 DIAGNOSIS — M48062 Spinal stenosis, lumbar region with neurogenic claudication: Secondary | ICD-10-CM

## 2021-11-22 MED ORDER — METHYLPREDNISOLONE ACETATE 80 MG/ML IJ SUSP
80.0000 mg | Freq: Once | INTRAMUSCULAR | Status: AC
  Administered 2021-11-22: 80 mg

## 2021-11-22 NOTE — Procedures (Signed)
Lumbosacral Transforaminal Epidural Steroid Injection - Sub-Pedicular Approach with Fluoroscopic Guidance  Patient: Melissa Lopez      Date of Birth: Nov 08, 1951 MRN: [id number] PCP: [facility], [place]      Visit Date: 11/22/2021   Universal Protocol:    Date/Time: 11/22/2021  Consent Given By: the patient  Position: PRONE  Additional Comments: Vital signs were monitored before and after the procedure. Patient was prepped and draped in the usual sterile fashion. The correct patient, procedure, and site was verified.   Injection Procedure Details:   Procedure diagnoses: Lumbar radiculopathy [M54.16]    Meds Administered:  Meds ordered this encounter  Medications   methylPREDNISolone acetate (DEPO-MEDROL) injection 80 mg    Laterality: Left  Location/Site: L3  Needle:5.0 in., 22 ga.  Short bevel or Quincke spinal needle  Needle Placement: Transforaminal  Findings:    -Comments: Excellent flow of contrast along the nerve, nerve root and into the epidural space. Very small amout of contrast used. None intravascular.  Procedure Details: After squaring off the end-plates to get a true AP view, the C-arm was positioned so that an oblique view of the foramen as noted above was visualized. The target area is just inferior to the "nose of the scotty dog" or sub pedicular. The soft tissues overlying this structure were infiltrated with 2-3 ml. of 1% Lidocaine without Epinephrine.  The spinal needle was inserted toward the target using a "trajectory" view along the fluoroscope beam.  Under AP and lateral visualization, the needle was advanced so it did not puncture dura and was located close the 6 O'Clock position of the pedical in AP tracterory. Biplanar projections were used to confirm position. Aspiration was confirmed to be negative for CSF and/or blood. A  0.101ml. volume of Isovue-180 low osmolar was injected and flow of contrast was noted at each  level. Radiographs were obtained for documentation purposes.   After attaining the desired flow of contrast documented above, a 0.5 to 1.0 ml test dose of 0.25% Marcaine was injected into each respective transforaminal space.  The patient was observed for 90 seconds post injection.  After no sensory deficits were reported, and normal lower extremity motor function was noted,   the above injectate was administered so that equal amounts of the injectate were placed at each foramen (level) into the transforaminal epidural space.   Additional Comments:  No complications occurred Dressing: 2 x 2 sterile gauze and Band-Aid    Post-procedure details: Patient was observed during the procedure. Post-procedure instructions were reviewed.  Patient left the clinic in stable condition.

## 2021-11-22 NOTE — Patient Instructions (Signed)

## 2021-11-22 NOTE — Progress Notes (Signed)
Pt state lower back pain that travels to her left hip and leg. Pt state sitting up makes the pain worse. Pt state she takes pain meds and uses pain cream to help ease her pain.  Numeric Pain Rating Scale and Functional Assessment Average Pain 8   In the last MONTH (on 0-10 scale) has pain interfered with the following?  1. General activity like being  able to carry out your everyday physical activities such as walking, climbing stairs, carrying groceries, or moving a chair?  Rating(10)   +Driver, -BT, -Dye Allergies. Actually does have somewhat of contrast allergy

## 2021-11-22 NOTE — Progress Notes (Signed)
Melissa Lopez - 70 y.o. female MRN [id number]  Date of birth: 07/10/1951  Office Visit Note: Visit Date: 11/22/2021 PCP: [facility], [place] Referred by: [facility], [employer] [facility]*  Subjective: Chief Complaint  Patient presents with   Lower Back - Pain   Left Leg - Pain   Left Hip - Pain   HPI:  Melissa Lopez is a 70 y.o. female who comes in today at the request of Dr. Anderson Malta for planned Left L3-4 Lumbar Transforaminal epidural steroid injection with fluoroscopic guidance.  The patient has failed conservative care including home exercise, medications, time and activity modification.  This injection will be diagnostic and hopefully therapeutic.  Please see requesting physician notes for further details and justification. MRI reviewed with images and spine model.  MRI reviewed in the note below. Consider L5 level with foraminal stenosis. Also she has some swelling with past iodinated contrast but was IV related. Listed dy made of shellfish but no contrast is made form shellfish and shellfish allergy is related to a protein.   ROS Otherwise per HPI.  Assessment & Plan: Visit Diagnoses:    ICD-10-CM   1. Lumbar radiculopathy  M54.16 XR C-ARM NO REPORT    Epidural Steroid injection    methylPREDNISolone acetate (DEPO-MEDROL) injection 80 mg    2. Spinal stenosis of lumbar region with neurogenic claudication  M48.062       Plan: No additional findings.   Meds & Orders:  Meds ordered this encounter  Medications   methylPREDNISolone acetate (DEPO-MEDROL) injection 80 mg    Orders Placed This Encounter  Procedures   XR C-ARM NO REPORT   Epidural Steroid injection    Follow-up: Return if symptoms worsen or fail to improve.   Procedures: No procedures performed  Lumbosacral Transforaminal Epidural Steroid Injection - Sub-Pedicular Approach with Fluoroscopic Guidance  Patient: Melissa Lopez      Date of Birth: Jan 15, 1951 MRN:  [id number] PCP: [facility] [place]      Visit Date: 11/22/2021   Universal Protocol:    Date/Time: 11/22/2021  Consent Given By: the patient  Position: PRONE  Additional Comments: Vital signs were monitored before and after the procedure. Patient was prepped and draped in the usual sterile fashion. The correct patient, procedure, and site was verified.   Injection Procedure Details:   Procedure diagnoses: Lumbar radiculopathy [M54.16]    Meds Administered:  Meds ordered this encounter  Medications   methylPREDNISolone acetate (DEPO-MEDROL) injection 80 mg    Laterality: Left  Location/Site: L3  Needle:5.0 in., 22 ga.  Short bevel or Quincke spinal needle  Needle Placement: Transforaminal  Findings:    -Comments: Excellent flow of contrast along the nerve, nerve root and into the epidural space. Very small amout of contrast used. None intravascular.  Procedure Details: After squaring off the end-plates to get a true AP view, the C-arm was positioned so that an oblique view of the foramen as noted above was visualized. The target area is just inferior to the "nose of the scotty dog" or sub pedicular. The soft tissues overlying this structure were infiltrated with 2-3 ml. of 1% Lidocaine without Epinephrine.  The spinal needle was inserted toward the target using a "trajectory" view along the fluoroscope beam.  Under AP and lateral visualization, the needle was advanced so it did not puncture dura and was located close the 6 O'Clock position of the pedical in AP tracterory. Biplanar projections were used to  confirm position. Aspiration was confirmed to be negative for CSF and/or blood. A  0.21ml. volume of Isovue-180 low osmolar was injected and flow of contrast was noted at each level. Radiographs were obtained for documentation purposes.   After attaining the desired flow of contrast documented above, a 0.5 to 1.0 ml test dose of 0.25% Marcaine was  injected into each respective transforaminal space.  The patient was observed for 90 seconds post injection.  After no sensory deficits were reported, and normal lower extremity motor function was noted,   the above injectate was administered so that equal amounts of the injectate were placed at each foramen (level) into the transforaminal epidural space.   Additional Comments:  No complications occurred Dressing: 2 x 2 sterile gauze and Band-Aid    Post-procedure details: Patient was observed during the procedure. Post-procedure instructions were reviewed.  Patient left the clinic in stable condition.   Clinical History: CT LUMBAR SPINE WITHOUT CONTRAST   TECHNIQUE: Multidetector CT imaging of the lumbar spine was performed without intravenous contrast administration. Multiplanar CT image reconstructions were also generated.   COMPARISON:  None.   FINDINGS: Segmentation: 5 lumbar type vertebrae.   Alignment: Normal.   Vertebrae: No acute fracture or focal pathologic process.   Paraspinal and other soft tissues: Negative.   Disc levels:   L3-4: Moderate spinal canal stenosis. Moderate right and mild left neural foraminal stenosis.   L4-5: Small disc bulge. No spinal canal or neural foraminal stenosis.   L5-S1: Moderate facet hypertrophy and small disc bulge. Severe bilateral neural foraminal stenosis.   IMPRESSION: 1. No acute fracture or static subluxation of the lumbar spine. 2. L3-4 moderate spinal canal stenosis and moderate right and mild left neural foraminal stenosis. 3. Severe bilateral L5-S1 neural foraminal stenosis.     Electronically Signed   By: Ulyses Jarred M.D.   On: 06/13/2021 03:38     Objective:  VS:  HT:     WT:    BMI:      BP:119/76   HR:60bpm   TEMP: ( )   RESP:  Physical Exam Vitals and nursing note reviewed.  Constitutional:      General: She is not in acute distress.    Appearance: Normal appearance. She is not ill-appearing.   HENT:     Head: Normocephalic and atraumatic.     Right Ear: External ear normal.     Left Ear: External ear normal.  Eyes:     Extraocular Movements: Extraocular movements intact.  Cardiovascular:     Rate and Rhythm: Normal rate.     Pulses: Normal pulses.  Pulmonary:     Effort: Pulmonary effort is normal. No respiratory distress.  Abdominal:     General: There is no distension.     Palpations: Abdomen is soft.  Musculoskeletal:        General: Tenderness present.     Cervical back: Neck supple.     Right lower leg: No edema.     Left lower leg: No edema.     Comments: Patient has good distal strength with no pain over the greater trochanters.  No clonus or focal weakness. In wheelchair in office.  Skin:    Findings: No erythema, lesion or rash.  Neurological:     General: No focal deficit present.     Mental Status: She is alert and oriented to person, place, and time.     Sensory: No sensory deficit.     Motor: No weakness or  abnormal muscle tone.     Coordination: Coordination normal.  Psychiatric:        Mood and Affect: Mood normal.        Behavior: Behavior normal.     Imaging: No results found.

## 2021-11-27 ENCOUNTER — Other Ambulatory Visit: Payer: Self-pay

## 2021-11-27 ENCOUNTER — Encounter: Payer: Self-pay | Admitting: Diagnostic Neuroimaging

## 2021-11-27 ENCOUNTER — Ambulatory Visit (INDEPENDENT_AMBULATORY_CARE_PROVIDER_SITE_OTHER): Payer: Medicare (Managed Care) | Admitting: Diagnostic Neuroimaging

## 2021-11-27 VITALS — BP 144/94 | HR 63 | Ht 64.0 in | Wt 138.0 lb

## 2021-11-27 DIAGNOSIS — R55 Syncope and collapse: Secondary | ICD-10-CM

## 2021-11-27 DIAGNOSIS — R42 Dizziness and giddiness: Secondary | ICD-10-CM

## 2021-11-27 DIAGNOSIS — R269 Unspecified abnormalities of gait and mobility: Secondary | ICD-10-CM

## 2021-11-27 NOTE — Progress Notes (Signed)
[facility]  PATIENT: Melissa Lopez DOB: 01-20-1951  REFERRING CLINICIAN: [facility], [place]: patient and daughter REASON FOR VISIT: new consult   HISTORICAL  CHIEF COMPLAINT:  Chief Complaint  Patient presents with   Gait Problem    Rm 7  dgtr- [name]  "she shakes then falls down, looks like a seizure, no episodes in over a month; in hospital twice"     HISTORY OF PRESENT ILLNESS:   UPDATE (11/27/21, VRP): Since last visit, doing poorly. Admitted in July 2022 for convulsive syncope, orthostatic hypotension, hypercalcemia, AKI, dehydration. Then admitted Aug 2022 for intractable N/V and hematemesis. Now calcium and renal fx are improving. No more shaking spells.   PRIOR HPI (04/16/21):  70 year old female here for evaluation of frequent falls.  History of coronary disease, sick sinus syndrome, pacemaker, diabetes, hypertension, gastroparesis, chronic nausea and vomiting, chronic THC use, chronic pain.  November 2019 patient was living in [place] with daughter and moved to [place] to be closer to family.  Since that time she has had being more falls.  In December 2019 she had a heart attack.  Symptoms continue to worsen since that time.  Now she is having falls every 2 to 3 months, sometimes several times a week.  She also has had 60 to 80 pound weight loss over the past few years.  She has had continued physical decline.  Recently she fell in the middle the night when she was using her bedside commode, striking her left forehead and eye on the bed rail.      REVIEW OF SYSTEMS: Full 14 system review of systems performed and negative with exception of: As per HPI.  ALLERGIES: Allergies  Allergen Reactions   Contrast Media [Iodinated Diagnostic Agents] Swelling    Contrast Dye - the one made out of shellfish, gives a really bad reaction - swelling   Oxycodone-Acetaminophen Anaphylaxis   Xarelto  [Rivaroxaban] Swelling   Oxycodone  Other (See Comments)    Statused by Person:  EAST, CAMELIA(CE) on [phone]    HOME MEDICATIONS: Outpatient Medications Prior to Visit  Medication Sig Dispense Refill   acetaminophen (TYLENOL) 325 MG tablet Take 650 mg by mouth 2 (two) times daily.     aspirin 81 MG chewable tablet Chew 1 tablet (81 mg total) by mouth daily. 30 tablet 0   carvedilol (COREG) 6.25 MG tablet Take 6.25 mg by mouth 2 (two) times daily with a meal.     cinacalcet (SENSIPAR) 30 MG tablet Take 1 tablet (30 mg total) by mouth 2 (two) times daily with a meal. 60 tablet 1   cyclobenzaprine (FLEXERIL) 5 MG tablet Take 5 mg by mouth at bedtime.     docusate sodium (COLACE) 100 MG capsule Take 100 mg by mouth 2 (two) times daily.     escitalopram (LEXAPRO) 20 MG tablet Take 20 mg by mouth daily.     famotidine (PEPCID) 20 MG tablet Take 1 tablet (20 mg total) by mouth daily. 30 tablet 0   gabapentin (NEURONTIN) 100 MG capsule Take 100 mg by mouth 3 (three) times daily.     HYDROcodone-acetaminophen (NORCO/VICODIN) 5-325 MG tablet Take 1 tablet by mouth every 6 (six) hours as needed for moderate pain. 15 tablet 0   linaclotide (LINZESS) 290 MCG CAPS capsule Take 1 capsule (290 mcg total) by mouth daily before breakfast. 30 capsule 1   metoCLOPramide (REGLAN) 5 MG tablet Take 1 tablet (5 mg total) by mouth  3 (three) times daily before meals. 90 tablet 1   Multiple Vitamin (MULTIVITAMIN WITH MINERALS) TABS tablet Take 1 tablet by mouth daily.     Nutritional Supplements (,FEEDING SUPPLEMENT, PROSOURCE PLUS) liquid Take 30 mLs by mouth daily. 887 mL 0   ondansetron (ZOFRAN) 4 MG tablet Take 1 tablet (4 mg total) by mouth every 6 (six) hours. 12 tablet 0   oxybutynin (DITROPAN-XL) 5 MG 24 hr tablet Take 5 mg by mouth daily.     pantoprazole (PROTONIX) 40 MG tablet Take 1 tablet (40 mg total) by mouth 2 (two) times daily before a meal. 60 tablet 1   polyethylene glycol (MIRALAX / GLYCOLAX) 17 g packet Take 17 g by mouth 2  (two) times daily. 14 each 0   rosuvastatin (CRESTOR) 5 MG tablet Take one tablet daily (Patient taking differently: Take 5 mg by mouth daily.) 90 tablet 3   senna (SENOKOT) 8.6 MG TABS tablet Take 2 tablets (17.2 mg total) by mouth daily. 120 tablet 0   traZODone (DESYREL) 50 MG tablet Take 50 mg by mouth at bedtime.     No facility-administered medications prior to visit.    PAST MEDICAL HISTORY: Past Medical History:  Diagnosis Date   CKD (chronic kidney disease) stage 3, GFR 30-59 ml/min (HCC) 10/31/2018   - per patient report   Coronary artery disease    per patient report - 2 caths with non-obstructive CAD   Essential hypertension    Frequent falls    H/O cardiac pacemaker 10/31/2018   implanted 05/2010   Sick sinus syndrome ([place])    Type 2 diabetes mellitus with complication, with long-term current use of insulin ([place]) 10/31/2018   with diabetic nephropathy    PAST SURGICAL HISTORY: Past Surgical History:  Procedure Laterality Date   BIOPSY  09/28/2020   Procedure: BIOPSY;  Surgeon: Ronald Lobo, MD;  Location: [facility];  Service: Endoscopy;;   BIOPSY  08/03/2021   Procedure: BIOPSY;  Surgeon: Daneil Dolin, MD;  [facility]: [facility];  Service: Endoscopy;;   CARDIAC PACEMAKER PLACEMENT  2008   in [place] for sick sinus syndrome   CHOLECYSTECTOMY     CORONARY/GRAFT ACUTE MI REVASCULARIZATION N/A 11/09/2020   Procedure: CORONARY/GRAFT ACUTE MI REVASCULARIZATION;  Surgeon: Leonie Man, MD;  Location: [facility];  Service: Cardiovascular;  Laterality: N/A;   ESOPHAGOGASTRODUODENOSCOPY N/A 09/28/2020   Procedure: ESOPHAGOGASTRODUODENOSCOPY (EGD);  Surgeon: Ronald Lobo, MD;  Location: [facility];  Service: Endoscopy;  Laterality: N/A;   ESOPHAGOGASTRODUODENOSCOPY (EGD) WITH PROPOFOL N/A 11/10/2018   Procedure: ESOPHAGOGASTRODUODENOSCOPY (EGD) WITH PROPOFOL;  Surgeon: Wilford Corner, MD;  Location: [facility];  Service: Endoscopy;   Laterality: N/A;   ESOPHAGOGASTRODUODENOSCOPY (EGD) WITH PROPOFOL N/A 08/03/2021   Procedure: ESOPHAGOGASTRODUODENOSCOPY (EGD) WITH PROPOFOL;  Surgeon: Daneil Dolin, MD;  [facility];  Service: Endoscopy;  Laterality: N/A;   LEFT HEART CATH AND CORONARY ANGIOGRAPHY N/A 10/31/2018   Procedure: LEFT HEART CATH AND CORONARY ANGIOGRAPHY;  Surgeon: Leonie Man, MD;  Location: [facility];  Service: Cardiovascular;  Laterality: N/A;   PACEMAKER GENERATOR CHANGE  09/2018   in [place]   REPLACEMENT TOTAL KNEE BILATERAL      FAMILY HISTORY: Family History  Problem Relation Age of Onset   Breast cancer Mother    Bone cancer Mother    Heart attack Father 24   Breast cancer Sister    Heart disease Brother  stents   Other Sister        MVA   Diabetes Brother     SOCIAL HISTORY: Social History   Socioeconomic History   Marital status: Single    Spouse name: Not on file   Number of children: 4   Years of education: Not on file   Highest education level: Some college, no degree  Occupational History    Comment: retired  Tobacco Use   Smoking status: Former    Years: 3.00    Types: Cigarettes    Quit date: 12/09/1969    Years since quitting: 52.0   Smokeless tobacco: Never  Vaping Use   Vaping Use: Never used  Substance and Sexual Activity   Alcohol use: Not Currently   Drug use: Not Currently    Comment: as teenager   Sexual activity: Not on file  Other Topics Concern   Not on file  Social History Narrative   04/16/21 Moved from [place] and lives with daughter, [name] in [place].   Now in [employer]   Social Determinants of Health   Financial Resource Strain: Not on file  Food Insecurity: Not on file  Transportation Needs: Not on file  Physical Activity: Not on file  Stress: Not on file  Social Connections: Not on file  Intimate Partner Violence: Not on file     PHYSICAL EXAM  GENERAL EXAM/CONSTITUTIONAL: Vitals:  Vitals:   11/27/21  0924  BP: (!) 144/94  Pulse: 63  Weight: 138 lb (62.6 kg)  Height: 5\' 4"  (1.626 m)   Body mass index is 23.69 kg/m. Wt Readings from Last 3 Encounters:  11/27/21 138 lb (62.6 kg)  08/03/21 128 lb 4.9 oz (58.2 kg)  07/03/21 144 lb (65.3 kg)   Patient is in no distress; well developed, nourished and groomed; neck is supple  CARDIOVASCULAR: Examination of carotid arteries is normal; no carotid bruits Regular rate and rhythm, no murmurs Examination of peripheral vascular system by observation and palpation is normal  EYES: Ophthalmoscopic exam of optic discs and posterior segments is normal; no papilledema or hemorrhages No results found.  MUSCULOSKELETAL: Gait, strength, tone, movements noted in Neurologic exam below  NEUROLOGIC: MENTAL STATUS:  No flowsheet data found. awake, alert, oriented to person, place and time recent and remote memory intact normal attention and concentration language fluent, comprehension intact, naming intact fund of knowledge appropriate  CRANIAL NERVE:  2nd - no papilledema on fundoscopic exam 2nd, 3rd, 4th, 6th - pupils equal and reactive to light, visual fields full to confrontation, extraocular muscles intact, no nystagmus 5th - facial sensation symmetric 7th - facial strength --> decr right mouth 8th - hearing intact 9th - palate elevates symmetrically, uvula midline 11th - shoulder shrug symmetric 12th - tongue protrusion midline MILD ORO-LINGUAL DYSKINESIAS  MOTOR:  normal bulk and tone BUE 4 BLE 3 PROX, 4 DISTAL  SENSORY:  normal and symmetric to light touch  COORDINATION:  finger-nose-finger, fine finger movements SLOW  REFLEXES:  deep tendon reflexes 1+ and symmetric  GAIT/STATION:  IN WHEEL CHAIR     DIAGNOSTIC DATA (LABS, IMAGING, TESTING) - I reviewed patient records, labs, notes, testing and imaging myself where available.  Lab Results  Component Value Date   WBC 10.2 08/07/2021   HGB 12.0 08/07/2021    HCT 35.5 (L) 08/07/2021   MCV 95.2 08/07/2021   PLT 175 08/07/2021      Component Value Date/Time   NA 135 08/07/2021 0558   K 3.9 08/07/2021 0558  CL 108 08/07/2021 0558   CO2 22 08/07/2021 0558   GLUCOSE 158 (H) 08/07/2021 0558   BUN 17 08/07/2021 0558   CREATININE 1.37 (H) 08/07/2021 0558   CALCIUM 10.1 08/07/2021 0558   CALCIUM 13.5 (HH) 09/22/2020 2032   PROT 5.5 (L) 08/07/2021 0558   ALBUMIN 2.7 (L) 08/07/2021 0558   AST 17 08/07/2021 0558   ALT 29 08/07/2021 0558   ALKPHOS 67 08/07/2021 0558   BILITOT 0.3 08/07/2021 0558   GFRNONAA 42 (L) 08/07/2021 0558   GFRAA 34 (L) 11/12/2018 0342   Lab Results  Component Value Date   CHOL 153 11/01/2018   HDL 24 (L) 11/01/2018   LDLCALC 94 11/01/2018   TRIG 175 (H) 11/01/2018   CHOLHDL 6.4 11/01/2018   Lab Results  Component Value Date   HGBA1C 5.5 08/02/2021   No results found for: VITAMINB12 Lab Results  Component Value Date   TSH 1.622 06/13/2021    04/11/21 CT head [I reviewed images myself and agree with interpretation. -VRP]  1. No acute intracranial abnormalities. 2. Moderate chronic microvascular ischemic change. Old right thalamic lacunar infarct. 3. Significant left periorbital hemorrhage/swelling. No injury to the left globe or postseptal orbit. No fractures.     ASSESSMENT AND PLAN  70 y.o. year old female here with:  Dx:  1. Gait difficulty   2. Lightheaded   3. 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        Penni Bombard, MD 09/81/1914, 78:29 AM Certified in Neurology, Neurophysiology and Neuroimaging  [facility] [address], [place], [place] [address] [phone]

## 2021-12-19 ENCOUNTER — Other Ambulatory Visit: Payer: Self-pay | Admitting: Hospitalist

## 2021-12-19 DIAGNOSIS — Z1231 Encounter for screening mammogram for malignant neoplasm of breast: Secondary | ICD-10-CM

## 2021-12-19 DIAGNOSIS — E2839 Other primary ovarian failure: Secondary | ICD-10-CM

## 2021-12-26 ENCOUNTER — Ambulatory Visit: Payer: Medicare (Managed Care) | Admitting: Orthopedic Surgery

## 2021-12-31 ENCOUNTER — Ambulatory Visit (INDEPENDENT_AMBULATORY_CARE_PROVIDER_SITE_OTHER): Payer: Medicare (Managed Care) | Admitting: Medical

## 2021-12-31 ENCOUNTER — Encounter: Payer: Self-pay | Admitting: Medical

## 2021-12-31 ENCOUNTER — Other Ambulatory Visit: Payer: Self-pay

## 2021-12-31 VITALS — BP 94/60 | HR 68 | Ht 63.0 in | Wt 140.0 lb

## 2021-12-31 DIAGNOSIS — I2510 Atherosclerotic heart disease of native coronary artery without angina pectoris: Secondary | ICD-10-CM

## 2021-12-31 DIAGNOSIS — I495 Sick sinus syndrome: Secondary | ICD-10-CM

## 2021-12-31 DIAGNOSIS — N1832 Chronic kidney disease, stage 3b: Secondary | ICD-10-CM

## 2021-12-31 DIAGNOSIS — I959 Hypotension, unspecified: Secondary | ICD-10-CM

## 2021-12-31 DIAGNOSIS — Z950 Presence of cardiac pacemaker: Secondary | ICD-10-CM

## 2021-12-31 MED ORDER — CARVEDILOL 3.125 MG PO TABS: 3.1250 mg | ORAL_TABLET | Freq: Two times a day (BID) | ORAL | 3 refills | Status: DC

## 2021-12-31 NOTE — Progress Notes (Signed)
Cardiology Office Note:    Date:  12/31/2021   ID:  Maridee Slape, DOB [birth year], MRN [id number]  PCP:  [facility], [place] Cardiologist:  Carlyle Dolly, MD  [facility] Electrophysiologist:  Thompson Grayer, MD   Referring MD: [facility], [place] [facility]   Chief Complaint: 6 month follow-up  History of Present Illness:    Melissa Lopez is a 71 y.o. 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 Emergent cath performed which revealed stable moderate nonobstructive CAD without evidence of a culprit lesions    Discharged from the hospital 06/18/21 with syncope in the setting of orthostatic hypotension felt to be convulsive syncope per neurology. She was also treated for hypercalcemia secondary to primary hyperparathyroidism. orthostatic vitals remain positive despite IV fluid resuscitation. Suspect she has autonomic dysfunction from her longstanding history of type 2 diabetes. Blood pressure management was challenging with her orthostasis. She would likely need a higher systolic blood pressure goal to reduce her risk of falls and syncope.   Patient hospitalized in August 2022 for nausea, AKI, leukocytosis. Trop mildly elevated and EKG showed chronic ST elevation likely related to atrophic hypertrophy. Echo showed hyperdynamic LV with apical hypertrophy.   Today, the patient reports she has been doing well since the last visit. No chest pain. She has unchanged SOB. She takes lasix 80mg  daily and this helps control swelling. No orthopnea or pnd. She lives with daughter. She always uses a walker. Can perform half ADLs, needs some help. BP low today, she feels lightheaded. Does not  check BP at home.   Past Medical History:  Diagnosis Date   CKD (chronic kidney disease) stage 3, GFR 30-59 ml/min (HCC) 10/31/2018   - per patient report   Coronary artery disease    per patient report - 2 caths with non-obstructive CAD   Essential hypertension    Frequent falls    H/O cardiac pacemaker 10/31/2018   implanted 05/2010   Sick sinus syndrome ([place])    Type 2 diabetes mellitus with complication, with long-term current use of insulin ([place]) 10/31/2018   with diabetic nephropathy    Past Surgical History:  Procedure Laterality Date   BIOPSY  09/28/2020   Procedure: BIOPSY;  Surgeon: Ronald Lobo, MD;  [facility];  Service: Endoscopy;;   BIOPSY  08/03/2021   Procedure: BIOPSY;  Surgeon: Daneil Dolin, MD;  [facility];  Service: Endoscopy;;   CARDIAC PACEMAKER PLACEMENT  2008   in [place] for sick sinus syndrome   CHOLECYSTECTOMY     CORONARY/GRAFT ACUTE MI REVASCULARIZATION N/A 11/09/2020   Procedure: CORONARY/GRAFT ACUTE MI REVASCULARIZATION;  Surgeon: Leonie Man, MD;  Location: [facility];  Service: Cardiovascular;  Laterality: N/A;   ESOPHAGOGASTRODUODENOSCOPY N/A 09/28/2020   Procedure: ESOPHAGOGASTRODUODENOSCOPY (EGD);  Surgeon: Ronald Lobo, MD;  Location: [facility];  Service: Endoscopy;  Laterality: N/A;   ESOPHAGOGASTRODUODENOSCOPY (EGD) WITH PROPOFOL N/A 11/10/2018   Procedure: ESOPHAGOGASTRODUODENOSCOPY (EGD) WITH PROPOFOL;  Surgeon: Wilford Corner, MD;  Location: [facility];  Service: Endoscopy;  Laterality: N/A;   ESOPHAGOGASTRODUODENOSCOPY (EGD) WITH PROPOFOL N/A 08/03/2021   Procedure: ESOPHAGOGASTRODUODENOSCOPY (EGD) WITH PROPOFOL;  Surgeon: Daneil Dolin, MD;  [facility];  Service: Endoscopy;  Laterality: N/A;   LEFT HEART CATH AND CORONARY ANGIOGRAPHY N/A 10/31/2018   Procedure: LEFT HEART CATH AND CORONARY ANGIOGRAPHY;  Surgeon: Leonie Man, MD;  Location: [facility];   Service: Cardiovascular;  Laterality: N/A;   PACEMAKER GENERATOR CHANGE  09/2018   in [place]   REPLACEMENT TOTAL KNEE BILATERAL      Current Medications: Current Meds  Medication Sig   acetaminophen (TYLENOL) 325 MG tablet Take 650 mg by mouth 2 (two) times daily.   aspirin 81 MG chewable tablet Chew 1 tablet (81 mg total) by mouth daily.   carvedilol (COREG) 3.125 MG tablet Take 1 tablet (3.125 mg total) by mouth 2 (two) times daily.   cinacalcet (SENSIPAR) 30 MG tablet Take 1 tablet (30 mg total) by mouth 2 (two) times daily with a meal.   cyclobenzaprine (FLEXERIL) 5 MG tablet Take 5 mg by mouth at bedtime.   docusate sodium (COLACE) 100 MG capsule Take 100 mg by mouth 2 (two) times daily.   escitalopram (LEXAPRO) 20 MG tablet Take 20 mg by mouth daily.   famotidine (PEPCID) 20 MG tablet Take 1 tablet (20 mg total) by mouth daily.   furosemide (LASIX) 40 MG tablet Take 80 mg by mouth daily.   gabapentin (NEURONTIN) 100 MG capsule Take 100 mg by mouth 3 (three) times daily.   HYDROcodone-acetaminophen (NORCO/VICODIN) 5-325 MG tablet Take 1 tablet by mouth every 6 (six) hours as needed for moderate pain.   linaclotide (LINZESS) 290 MCG CAPS capsule Take 1 capsule (290 mcg total) by mouth daily before breakfast.   metoCLOPramide (REGLAN) 5 MG tablet Take 1 tablet (5 mg total) by mouth 3 (three) times daily before meals.   Multiple Vitamin (MULTIVITAMIN WITH MINERALS) TABS tablet Take 1 tablet by mouth daily.   Nutritional Supplements (,FEEDING SUPPLEMENT, PROSOURCE PLUS) liquid Take 30 mLs by mouth daily.   ondansetron (ZOFRAN) 4 MG tablet Take 1 tablet (4 mg total) by mouth every 6 (six) hours.   oxybutynin (DITROPAN-XL) 5 MG 24 hr tablet Take 5 mg by mouth daily.   pantoprazole (PROTONIX) 40 MG tablet Take 1 tablet (40 mg total) by mouth 2 (two) times daily before a meal.   polyethylene glycol (MIRALAX / GLYCOLAX) 17 g packet Take 17 g by mouth 2 (two) times daily.    rosuvastatin (CRESTOR) 5 MG tablet Take one tablet daily (Patient taking differently: Take 5 mg by mouth daily.)   senna (SENOKOT) 8.6 MG TABS tablet Take 2 tablets (17.2 mg total) by mouth daily.   traZODone (DESYREL) 50 MG tablet Take 50 mg by mouth at bedtime.   [DISCONTINUED] carvedilol (COREG) 6.25 MG tablet Take 6.25 mg by mouth 2 (two) times daily with a meal.     Allergies:   Contrast media [iodinated contrast media], Oxycodone-acetaminophen, Xarelto  [rivaroxaban], and Oxycodone   Social History   Socioeconomic History   Marital status: Single    Spouse name: Not on file   Number of children: 4   Years of education: Not on file   Highest education level: Some college, no degree  Occupational History    Comment: retired  Tobacco Use   Smoking status: Former    Years: 3.00    Types: Cigarettes    Quit date: 12/09/1969    Years since quitting: 52.0   Smokeless tobacco: Never  Vaping Use   Vaping Use: Never used  Substance and Sexual Activity  Alcohol use: Not Currently   Drug use: Not Currently    Comment: as teenager   Sexual activity: Not on file  Other Topics Concern   Not on file  Social History Narrative   04/16/21 Moved from [place] and lives with daughter, [name] in [place].   Now in [employer]   Social Determinants of Health   Financial Resource Strain: Not on file  Food Insecurity: Not on file  Transportation Needs: Not on file  Physical Activity: Not on file  Stress: Not on file  Social Connections: Not on file     Family History: The patient's family history includes Bone cancer in her mother; Breast cancer in her mother and sister; Diabetes in her brother; Heart attack (age of onset: 81) in her father; Heart disease in her brother; Other in her sister.  ROS:   Please see the history of present illness.     All other systems reviewed and are negative.  EKGs/Labs/Other Studies Reviewed:    The following studies were reviewed today:  Echo limited 07/2021  1.  There is moderate asymmsetric septal hypertrophy. The apex is not  completely visualized but appears to have severe apical hypertrophy with  systolic apical obliteration but no significant intracavitary gradient.  Findings consistent with apical  hypertrophic cardiomyopathy. . Left ventricular ejection fraction, by  estimation, is 65 to 70%. The left ventricle has normal function. The left  ventricle has no regional wall motion abnormalities. Left ventricular  diastolic parameters are consistent  with Grade I diastolic dysfunction (impaired relaxation).   2. Right ventricular systolic function is normal. The right ventricular  size is normal.   3. Limited study to evaluate LV function   LHC 2021 1st Mrg lesion is 100% stenosed. Ost Ramus lesion is 65% stenosed. Ost LAD to Prox LAD lesion is 15% stenosed with 60% stenosed side branch in Ost 1st Diag. Ost Cx to Prox Cx lesion is 45% stenosed. Diffuse mild CAD and very tortuous vessels. No true occlusive lesions. None   SUMMARY Stable nonocclusive mild to moderate disease with no culprit lesion for ST elevation MI. ->  The true diagnosis is no longer STEMI -> very similar findings with that very similar presentation 2 years ago. EF not assessed due to plan to conserve contrast. Relatively low LVEDP-IV given 500 mL bolus.     PLAN Due to bed availability, she will be admitted to CT ICU, but could be transferred if necessary We will consult [facility] medicine service to assist with her severely elevated white blood cell count, already on antibiotics for possible UTI as well as profound nausea vomiting and hyperglycemia. ->  Concern for possible HONK (hyperosmotic nonketotic ketoacidosis  Coronary Diagrams  Diagnostic Dominance: Right Intervention   EKG:  EKG is not ordered today.    Recent Labs: 06/13/2021: TSH 1.622 08/04/2021: Magnesium 2.2 08/07/2021: ALT 29; BUN 17; Creatinine, Ser 1.37; Hemoglobin 12.0; Platelets 175; Potassium 3.9;  Sodium 135  Recent Lipid Panel    Component Value Date/Time   CHOL 153 11/01/2018 0516   TRIG 175 (H) 11/01/2018 0516   HDL 24 (L) 11/01/2018 0516   CHOLHDL 6.4 11/01/2018 0516   VLDL 35 11/01/2018 0516   LDLCALC 94 11/01/2018 0516      Physical Exam:    VS:  BP 94/60    Pulse 68    Ht 5\' 3"  (1.6 m)    Wt 140 lb (63.5 kg)    SpO2 96%    BMI 24.80 kg/m  Wt Readings from Last 3 Encounters:  12/31/21 140 lb (63.5 kg)  11/27/21 138 lb (62.6 kg)  08/03/21 128 lb 4.9 oz (58.2 kg)     GEN:  Well nourished, well developed in no acute distress HEENT: Normal NECK: No JVD; No carotid bruits LYMPHATICS: No lymphadenopathy CARDIAC: RRR, no murmurs, rubs, gallops RESPIRATORY:  Clear to auscultation without rales, wheezing or rhonchi  ABDOMEN: Soft, non-tender, non-distended MUSCULOSKELETAL:  No edema; No deformity  SKIN: Warm and dry NEUROLOGIC:  Alert and oriented x 3 PSYCHIATRIC:  Normal affect   ASSESSMENT:    1. CAD in native artery   2. Stage 3b chronic kidney disease ([place])   3. Sick sinus syndrome (HCC)   4. Cardiac pacemaker in situ   5. Hypotension, unspecified hypotension type    PLAN:    In order of problems listed above:  CAD Cath in 2019 showed mild diffuse disease. She denies worsening anginal symptoms. No further ischemic work-up at this time. Continue ASA, BB, statin.  Orthostatic hypotension H/o HTN BP low today. She reports lightheadedness at times. I will decrease the Coreg to 3.123mg  BID. Reommended she check BP for 1-2 weeks and call in with numbers, may need to d/c Coreg completely. Also on lasix 80mg  daily.   Permanent PPM for symptomatic bradycardia Follows with EP. Last check was unremarkable.   CKD stage 3 She reports she follows with nephrology   Disposition: Follow up in 6 month(s) with MD/APP    Signed, Nakeem Murnane Ninfa Meeker, PA-C  12/31/2021 3:06 PM    [place]

## 2021-12-31 NOTE — Patient Instructions (Signed)
Medication Instructions:   DECREASE Coreg to 3.125 mg twice a day    Labwork: None today  Testing/Procedures: None today  Follow-Up: 6 months  Any Other Special Instructions Will Be Listed Below (If Applicable).   Please check blood pressure routinely and call our office with readings, [phone]  If you need a refill on your cardiac medications before your next appointment, please call your pharmacy.

## 2022-01-01 ENCOUNTER — Ambulatory Visit (INDEPENDENT_AMBULATORY_CARE_PROVIDER_SITE_OTHER): Payer: Medicare (Managed Care) | Admitting: Gastroenterology

## 2022-01-01 ENCOUNTER — Telehealth: Payer: Self-pay | Admitting: Medical

## 2022-01-01 ENCOUNTER — Encounter: Payer: Self-pay | Admitting: Gastroenterology

## 2022-01-01 VITALS — BP 118/78 | HR 61 | Temp 96.8°F | Ht 64.0 in | Wt 140.4 lb

## 2022-01-01 DIAGNOSIS — K5900 Constipation, unspecified: Secondary | ICD-10-CM | POA: Diagnosis not present

## 2022-01-01 DIAGNOSIS — K2100 Gastro-esophageal reflux disease with esophagitis, without bleeding: Secondary | ICD-10-CM

## 2022-01-01 DIAGNOSIS — I5032 Chronic diastolic (congestive) heart failure: Secondary | ICD-10-CM

## 2022-01-01 DIAGNOSIS — K219 Gastro-esophageal reflux disease without esophagitis: Secondary | ICD-10-CM | POA: Insufficient documentation

## 2022-01-01 NOTE — Progress Notes (Signed)
Referring Provider: [facility], [facility] Primary Care Physician:  [facility]: Dr. Gala Romney    Chief Complaint  Patient presents with   Gastroesophageal Reflux    Better. No vomiting in past months    HPI:   Melissa Lopez is a 71 y.o. female presenting today in hospital follow-up. She has multiple comorbidities and admitted with hematemesis and acute renal failure to [facility] in August 2022, s/p EGD 8/26 with severe reflux esophagitis, bile gastritis, felt to have delayed gastric emptying. Pathology from esophageal biopsies with fibrinopurulent exudate and granulation tissue consistent with ulcer base. Reglan resumed on 8/26, as patient has noted improvement with N/V on the past.   Presumed gastroparesis but has been unable to tolerate GES in the past. Would always vomit the eggs. Pantoprazole BID. GERD controlled. Reglan TID before meals and at bedtime. Not always at lunchtime. No vomiting in 2 months.    Linzess 290 mcg daily. Has a BM about once per day. Some straining. Taking Senokot 2 tablets twice a day.   No overt GI bleeding. Last colonoscopy about 10 years ago. No personal history of polyps. No family history of colorectal cancer or polyps.   Past Medical History:  Diagnosis Date   CKD (chronic kidney disease) stage 3, GFR 30-59 ml/min (HCC) 10/31/2018   - per patient report   Coronary artery disease    per patient report - 2 caths with non-obstructive CAD   Essential hypertension    Frequent falls    H/O cardiac pacemaker 10/31/2018   implanted 05/2010   Sick sinus syndrome ([place])    Type 2 diabetes mellitus with complication, with long-term current use of insulin ([place]) 10/31/2018   with diabetic nephropathy    Past Surgical History:  Procedure Laterality Date   BIOPSY  09/28/2020   Procedure: BIOPSY;  Surgeon: Ronald Lobo, MD;  [facility];  Service: Endoscopy;;   BIOPSY  08/03/2021    Procedure: BIOPSY;  Surgeon: Daneil Dolin, MD;  [facility];  Service: Endoscopy;;   CARDIAC PACEMAKER PLACEMENT  2008   in [place] for sick sinus syndrome   CHOLECYSTECTOMY     CORONARY/GRAFT ACUTE MI REVASCULARIZATION N/A 11/09/2020   Procedure: CORONARY/GRAFT ACUTE MI REVASCULARIZATION;  Surgeon: Leonie Man, MD;  Location: [facility];  Service: Cardiovascular;  Laterality: N/A;   ESOPHAGOGASTRODUODENOSCOPY N/A 09/28/2020   Procedure: ESOPHAGOGASTRODUODENOSCOPY (EGD);  Surgeon: Ronald Lobo, MD;  Location: [facility];  Service: Endoscopy;  Laterality: N/A;   ESOPHAGOGASTRODUODENOSCOPY (EGD) WITH PROPOFOL N/A 11/10/2018   Procedure: ESOPHAGOGASTRODUODENOSCOPY (EGD) WITH PROPOFOL;  Surgeon: Wilford Corner, MD;  Location: [facility];  Service: Endoscopy;  Laterality: N/A;   ESOPHAGOGASTRODUODENOSCOPY (EGD) WITH PROPOFOL N/A 08/03/2021   severe reflux esophagitis, bile gastritis, felt to have delayed gastric emptying. Pathology from esophageal biopsies with fibrinopurulent exudate and granulation tissue consistent with ulcer base   LEFT HEART CATH AND CORONARY ANGIOGRAPHY N/A 10/31/2018   Procedure: LEFT HEART CATH AND CORONARY ANGIOGRAPHY;  Surgeon: Leonie Man, MD;  Location: [facility];  Service: Cardiovascular;  Laterality: N/A;   PACEMAKER GENERATOR CHANGE  09/2018   in [place]   REPLACEMENT TOTAL KNEE BILATERAL      Current Outpatient Medications  Medication Sig Dispense Refill   acetaminophen (TYLENOL) 325 MG tablet Take 975 mg by mouth 2 (two) times daily.     apixaban (ELIQUIS) 2.5 MG  TABS tablet Take 2.5 mg by mouth 2 (two) times daily.     carvedilol (COREG) 3.125 MG tablet Take 1 tablet (3.125 mg total) by mouth 2 (two) times daily. 180 tablet 3   cinacalcet (SENSIPAR) 30 MG tablet Take 1 tablet (30 mg total) by mouth 2 (two) times daily with a meal. (Patient taking differently: Take 45 mg by mouth 2 (two) times  daily with a meal.) 60 tablet 1   cyclobenzaprine (FLEXERIL) 5 MG tablet Take 5 mg by mouth at bedtime.     FLUoxetine (PROZAC) 20 MG tablet Take 40 mg by mouth at bedtime.     furosemide (LASIX) 40 MG tablet Take 80 mg by mouth daily.     gabapentin (NEURONTIN) 300 MG capsule Take 300 mg by mouth at bedtime.     HYDROcodone-acetaminophen (NORCO/VICODIN) 5-325 MG tablet Take 1 tablet by mouth every 6 (six) hours as needed for moderate pain. (Patient taking differently: Take 1 tablet by mouth 2 (two) times daily.) 15 tablet 0   linaclotide (LINZESS) 290 MCG CAPS capsule Take 1 capsule (290 mcg total) by mouth daily before breakfast. 30 capsule 1   metoCLOPramide (REGLAN) 5 MG tablet Take 1 tablet (5 mg total) by mouth 3 (three) times daily before meals. (Patient taking differently: Take 5 mg by mouth 4 (four) times daily -  before meals and at bedtime.) 90 tablet 1   mirabegron ER (MYRBETRIQ) 25 MG TB24 tablet Take 25 mg by mouth daily.     mirtazapine (REMERON) 7.5 MG tablet Take 7.5 mg by mouth at bedtime.     Multiple Vitamin (MULTIVITAMIN WITH MINERALS) TABS tablet Take 1 tablet by mouth daily.     Nutritional Supplements (,FEEDING SUPPLEMENT, PROSOURCE PLUS) liquid Take 30 mLs by mouth daily. 887 mL 0   pantoprazole (PROTONIX) 40 MG tablet Take 1 tablet (40 mg total) by mouth 2 (two) times daily before a meal. 60 tablet 1   rosuvastatin (CRESTOR) 20 MG tablet Take 20 mg by mouth daily.     senna-docusate (SENOKOT-S) 8.6-50 MG tablet Take 2 tablets by mouth 2 (two) times daily.     traZODone (DESYREL) 50 MG tablet Take 50 mg by mouth at bedtime.     Vitamin D, Ergocalciferol, (DRISDOL) 1.25 MG (50000 UNIT) CAPS capsule Take 50,000 Units by mouth every 7 (seven) days.     aspirin 81 MG chewable tablet Chew 1 tablet (81 mg total) by mouth daily. (Patient not taking: Reported on 01/01/2022) 30 tablet 0   polyethylene glycol (MIRALAX / GLYCOLAX) 17 g packet Take 17 g by mouth 2 (two) times daily.  (Patient not taking: Reported on 01/01/2022) 14 each 0   No current facility-administered medications for this visit.    Allergies as of 01/01/2022 - Review Complete 01/01/2022  Allergen Reaction Noted   Contrast media [iodinated contrast media] Swelling 04/13/2020   Oxycodone-acetaminophen Anaphylaxis 10/31/2018   Xarelto  [rivaroxaban] Swelling 04/13/2020   Oxycodone Other (See Comments) 04/13/2020    Family History  Problem Relation Age of Onset   Breast cancer Mother    Bone cancer Mother    Heart attack Father 49   Breast cancer Sister    Other Sister        MVA   Heart disease Brother        stents   Diabetes Brother    Colon cancer Neg Hx    Colon polyps Neg Hx     Social History   Socioeconomic History  Marital status: Single    Spouse name: Not on file   Number of children: 4   Years of education: Not on file   Highest education level: Some college, no degree  Occupational History    Comment: retired  Tobacco Use   Smoking status: Former    Years: 3.00    Types: Cigarettes    Quit date: 12/09/1969    Years since quitting: 52.0   Smokeless tobacco: Never  Vaping Use   Vaping Use: Never used  Substance and Sexual Activity   Alcohol use: Not Currently   Drug use: Not Currently    Comment: as teenager   Sexual activity: Not on file  Other Topics Concern   Not on file  Social History Narrative   04/16/21 Moved from [place] and lives with daughter, [name] in [place].   Now in [employer]   Social Determinants of Health   Financial Resource Strain: Not on file  Food Insecurity: Not on file  Transportation Needs: Not on file  Physical Activity: Not on file  Stress: Not on file  Social Connections: Not on file    Review of Systems: Gen: Denies fever, chills, anorexia. Denies fatigue, weakness, weight loss.  CV: Denies chest pain, palpitations, syncope, peripheral edema, and claudication. Resp: Denies dyspnea at rest, cough, wheezing, coughing up blood, and  pleurisy. GI:see HPI Derm: Denies rash, itching, dry skin Psych: Denies depression, anxiety, memory loss, confusion. No homicidal or suicidal ideation.  Heme: Denies bruising, bleeding, and enlarged lymph nodes.  Physical Exam: BP 118/78    Pulse 61    Temp (!) 96.8 F (36 C) (Temporal)    Ht 5\' 4"  (1.626 m)    Wt 140 lb 6.4 oz (63.7 kg)    BMI 24.10 kg/m  General:   Alert and oriented. No distress noted. Pleasant and cooperative.  Head:  Normocephalic and atraumatic. Eyes:  Conjuctiva clear without scleral icterus. Mouth:  mask in place Cardiac: query systolic murmur Lungs: clear bilaterally Abdomen:  +BS, soft, non-tender and non-distended. No rebound or guarding. No HSM or masses noted. Msk:  with kyphosis. Uses walker to assist with ambulation long distances. Able to walk short distances on own. Needs assistance on exam table.  Extremities:  Without edema. Neurologic:  Alert and  oriented x4 Psych:  Alert and cooperative. Normal mood and affect.  ASSESSMENT: Melissa Lopez is a 71 y.o. female presenting today in hospital follow-up. She has multiple comorbidities and admitted with hematemesis and acute renal failure to [facility] in August 2022, s/p EGD 8/26 with severe reflux esophagitis, bile gastritis, felt to have delayed gastric emptying. Pathology from esophageal biopsies with fibrinopurulent exudate and granulation tissue consistent with ulcer base. Reglan resumed on 8/26, as patient has noted improvement with N/V on the past.   GERD: doing well on Protonix BID. No overt GI bleeding.   Presumed gastroparesis: has not been able to tolerate GES in past as would vomit. Reglan TID and at bedtime. I have asked them to decrease this to twice a day if possible while still controlling symptoms.   Severe esophagitis: surveillance EGD recommended at this time.  Constipation: not ideally managed with Linzess 290 mcg and Senokot. Add back Miralax daily.   Screening colonoscopy: last  over 10 years ago. Will attempt this at time of surveillance EGD. We discussed constipation would need to be managed prior. No family history of colorectal cancer or polyps; no personal history of polyps.   Will request to hold Eliquis  2 days prior.    PLAN:  Proceed with colonoscopy/EGD by Dr. Abbey Chatters  in near future: the risks, benefits, and alternatives have been discussed with the patient in detail. The patient states understanding and desires to proceed.   Continue PPI BID  Continue Reglan but decrease to only twice per day  Continue Linzess and Senokot, add Miralax daily  Further recommendations to follow   Annitta Needs, PhD, ANP-BC [facility]

## 2022-01-01 NOTE — Telephone Encounter (Signed)
Patient was seen yesterday as a 6 months f/u.   TEE's are done in the hospital with anesthesia   I will forward to provider to clarify

## 2022-01-01 NOTE — Telephone Encounter (Signed)
Leda Gauze from [facility] called. She was not sure why the patient did not have a TEE done yesterday. The facility was under the impression that was supposed to be dome yesterday   Please call her to schedule the appointment. She is the appointment scheduler for the facility

## 2022-01-01 NOTE — Patient Instructions (Addendum)
Decrease Reglan to just twice a day, before first and last meal of day.   Add Miralax one capful each day. Continue Linzess and Senokot as you are doing. Please let us know if this is not helpful.  Continue Protonix twice a day.   We are arranging a routine screening colonoscopy and the repeat endoscopy to ensure your esophagus has healed!  We will need you to stop Eliquis 2 days before.   Further recommendations to follow!  I enjoyed seeing you again today! As you know, I value our relationship and want to provide genuine, compassionate, and quality care. I welcome your feedback. If you receive a survey regarding your visit,  I greatly appreciate you taking time to fill this out. See you next time!  Annitta Needs, PhD, ANP-BC [facility] Gastroenterology

## 2022-01-02 ENCOUNTER — Telehealth: Payer: Self-pay | Admitting: Internal Medicine

## 2022-01-02 NOTE — Telephone Encounter (Signed)
Heather with [facility] is following up. Again, she would like to clarify why the patient was not scheduled for a TEE. She states an order was faxed to the office on 11/30/21 and they were told the patient had been scheduled for a TEE on 1/23. Heather plans to fax the order again, but she would like an explanation as to why the test was not scheduled. Appointment notes were modified on 01/20 and state, "#4 *6 month f/u  f/u per [facility] for a TEE." RN unavailable for advisement. Please return call to discuss when able.

## 2022-01-02 NOTE — Telephone Encounter (Signed)
Note placed on encounter form for scheduling. Awaiting ok to hold Eliquis prior to TCS/EGD.

## 2022-01-02 NOTE — Telephone Encounter (Signed)
Returned call to [employer] with [facility]. No answer left msg to call back.

## 2022-01-02 NOTE — Telephone Encounter (Signed)
WHEN TIME TO SCHEDULE PLEASE CALL MARILYN AT [place] [facility] [phone]

## 2022-01-03 NOTE — Telephone Encounter (Signed)
Spoke with Leda Gauze with [facility]. Number given to scheduler at [facility] to schedule TEE.

## 2022-01-10 ENCOUNTER — Ambulatory Visit (INDEPENDENT_AMBULATORY_CARE_PROVIDER_SITE_OTHER): Payer: Medicare (Managed Care)

## 2022-01-10 ENCOUNTER — Telehealth: Payer: Self-pay

## 2022-01-10 DIAGNOSIS — I495 Sick sinus syndrome: Secondary | ICD-10-CM

## 2022-01-10 LAB — CUP PACEART REMOTE DEVICE CHECK
Battery Remaining Longevity: 88 mo
Battery Remaining Percentage: 68 %
Battery Voltage: 3.01 V
Brady Statistic AP VP Percent: 15 %
Brady Statistic AP VS Percent: 62 %
Brady Statistic AS VP Percent: 2.5 %
Brady Statistic AS VS Percent: 19 %
Brady Statistic RA Percent Paced: 76 %
Brady Statistic RV Percent Paced: 18 %
Date Time Interrogation Session: 20230202020015
Implantable Lead Implant Date: 20110113
Implantable Lead Implant Date: 20110113
Implantable Lead Location: 753859
Implantable Lead Location: 753860
Implantable Pulse Generator Implant Date: 20191008
Lead Channel Impedance Value: 460 Ohm
Lead Channel Impedance Value: 650 Ohm
Lead Channel Pacing Threshold Amplitude: 0.625 V
Lead Channel Pacing Threshold Amplitude: 0.875 V
Lead Channel Pacing Threshold Pulse Width: 0.4 ms
Lead Channel Pacing Threshold Pulse Width: 0.4 ms
Lead Channel Sensing Intrinsic Amplitude: 12 mV
Lead Channel Sensing Intrinsic Amplitude: 4.6 mV
Lead Channel Setting Pacing Amplitude: 1.125
Lead Channel Setting Pacing Amplitude: 1.625
Lead Channel Setting Pacing Pulse Width: 0.4 ms
Lead Channel Setting Sensing Sensitivity: 2 mV
Pulse Gen Model: 2272
Pulse Gen Serial Number: 9064940

## 2022-01-10 NOTE — Telephone Encounter (Signed)
Phoned to the [facility] and spoke with Leda Gauze, who was advised by the pt's PCP that it is ok for the pt to hold her Coumadin x 48hrs prior to colonoscopy asa lll and EGD. Please call Leda Gauze once the appts are scheduled.

## 2022-01-10 NOTE — Telephone Encounter (Signed)
Leda Gauze from [place] called wanting to schedule patient's TEE, she wants it scheduled ASAP.

## 2022-01-11 ENCOUNTER — Ambulatory Visit: Payer: Medicare (Managed Care) | Admitting: Orthopedic Surgery

## 2022-01-11 ENCOUNTER — Other Ambulatory Visit (HOSPITAL_COMMUNITY): Payer: Self-pay | Admitting: Hospitalist

## 2022-01-11 DIAGNOSIS — I509 Heart failure, unspecified: Secondary | ICD-10-CM

## 2022-01-11 NOTE — Addendum Note (Signed)
Addended by: Levonne Hubert on: 01/11/2022 01:45 PM   Modules accepted: Orders

## 2022-01-11 NOTE — Telephone Encounter (Addendum)
Progress note emailed from [place] ( Dr. Vicki Mallet)  which states that pt is to have echocardiogram (not TEE) to assess for functional changes or missed MI. Order placed.

## 2022-01-14 NOTE — Telephone Encounter (Signed)
Medication is Eliquis. Please hold X 48 hours prior.

## 2022-01-14 NOTE — Telephone Encounter (Signed)
Tried to call Leda Gauze at  [facility] to schedule TCS/EGD (per previous phone note) [phone]. LMOVM for return call.

## 2022-01-15 ENCOUNTER — Other Ambulatory Visit: Payer: Self-pay

## 2022-01-15 MED ORDER — PEG 3350-KCL-NA BICARB-NACL 420 G PO SOLR: 4000.0000 mL | ORAL | 0 refills | Status: DC

## 2022-01-15 NOTE — Telephone Encounter (Signed)
Melissa Lopez at [place] called office, TCS/EGD scheduled for 02/04/22 at 1:15pm. She advised me to fax instructions/pre-op appt to [phone] and mail to pt. Advised to fax rx for prep to medication nurse. Melissa Lopez will fax authorization to office. Orders entered.

## 2022-01-15 NOTE — Telephone Encounter (Signed)
Pre-op appt 01/31/22 at 10:30am. Appt letter and procedure instructions faxed to [facility] and mailed to pt. Rx for prep faxed to medication nurse at [place].

## 2022-01-16 NOTE — Progress Notes (Signed)
Remote pacemaker transmission.   

## 2022-01-25 ENCOUNTER — Telehealth: Payer: Self-pay | Admitting: Internal Medicine

## 2022-01-25 NOTE — Telephone Encounter (Signed)
Leda Gauze from [facility] called asking to reschedule patient's procedure that's scheduled with Dr Abbey Chatters on 02/04/2022. Please call [phone]

## 2022-01-25 NOTE — Telephone Encounter (Signed)
Tried to call Leda Gauze at [place], [place] for return call.

## 2022-01-28 ENCOUNTER — Other Ambulatory Visit: Payer: Self-pay

## 2022-01-28 ENCOUNTER — Ambulatory Visit (INDEPENDENT_AMBULATORY_CARE_PROVIDER_SITE_OTHER): Payer: Medicare (Managed Care) | Admitting: Surgical

## 2022-01-28 DIAGNOSIS — M545 Low back pain, unspecified: Secondary | ICD-10-CM

## 2022-01-28 DIAGNOSIS — M5410 Radiculopathy, site unspecified: Secondary | ICD-10-CM | POA: Diagnosis not present

## 2022-01-28 NOTE — Telephone Encounter (Signed)
Leda Gauze called office, TCS/EGD rescheduled to 02/18/22 at 1:30pm. Endo scheduler informed.

## 2022-01-29 NOTE — Telephone Encounter (Signed)
Pre-op appt 02/14/22 at 11:00am. Appt letter/procedure instructions mailed to pt and faxed to [facility] (fax# [phone]).

## 2022-01-31 ENCOUNTER — Encounter (HOSPITAL_COMMUNITY): Payer: Medicare (Managed Care)

## 2022-02-03 ENCOUNTER — Encounter: Payer: Self-pay | Admitting: Orthopedic Surgery

## 2022-02-03 NOTE — Progress Notes (Signed)
Office Visit Note   Patient: Melissa Lopez           Date of Birth: 1951/03/24           MRN: [id number] Visit Date: 01/28/2022 Requested by: [employer], [place],  [place] [address] PCP: [facility], [place]  Subjective: Chief Complaint  Patient presents with   Lower Back - Pain    HPI: Melissa Lopez is a 71 y.o. female who presents to the office complaining of continued low back pain with radicular pain down the posterior aspect of the left leg that travels from her low back through her buttocks down to her knee.  She denies any numbness or tingling.  Pain continues to wake her up.  She denies any subjective weakness or any history of spine surgery.  She does have history of bilateral total knee arthroplasty.  She had injection with Dr. Ernestina Patches on 11/22/2021 which was left L3 transforaminal injection.  She notes 80% relief for 2 days before recurrence of symptoms.  Medical history significant for CHF with pacemaker.  Does have history of gastroparesis and CKD 3.  She is on Eliquis chronically..                ROS: All systems reviewed are negative as they relate to the chief complaint within the history of present illness.  Patient denies fevers or chills.  Assessment & Plan: Visit Diagnoses:  1. Radicular syndrome of left leg   2. Lumbar pain     Plan: Patient is a 71 year old female who presents for evaluation of left leg radicular pain.  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  She returns following ESI by Dr. Ernestina Patches on 11/22/2021 which was a left L3 transforaminal injection that provided 80% relief of her symptoms for about 2 days.  Symptoms returned abruptly 2 days after injection.  Discussed further options for the patient including surgical consult versus repeat injection.  She would like to try another injection to  see if this will provide more relief.  Plan to refer her back to Dr. Laurence Spates for repeat ESI.  Also plan to refer her to Dr. Kathyrn Sheriff for surgical consult regarding continued radicular pain with short-lived improvement following ESI.  She is hesitant to pursue surgical intervention with her medical history however.  Follow-up with [place] as needed.  Follow-Up Instructions: No follow-ups on file.   Orders:  Orders Placed This Encounter  Procedures   Ambulatory referral to Neurosurgery   Ambulatory referral to Physical Medicine Rehab   No orders of the defined types were placed in this encounter.     Procedures: No procedures performed   Clinical Data: No additional findings.  Objective: Vital Signs: There were no vitals taken for this visit.  Physical Exam:  Constitutional: Patient appears well-developed HEENT:  Head: Normocephalic Eyes:EOM are normal Neck: Normal range of motion Cardiovascular: Normal rate Pulmonary/chest: Effort normal Neurologic: Patient is alert Skin: Skin is warm Psychiatric: Patient has normal mood and affect  Ortho Exam: Ortho exam demonstrates positive straight leg raise on the left, negative on the right.  Sensation intact through all dermatomes of the bilateral lower extremities.  5/5 motor strength of bilateral hip flexion, quadricep, hamstring, dorsiflexion, plantarflexion, EHL.  No evidence of clonus bilaterally.  Specialty Comments:  No specialty comments available.  Imaging: No results found.  PMFS History: Patient Active Problem List   Diagnosis Date Noted   GERD (gastroesophageal reflux disease) 01/01/2022   Constipation    Abdominal pain    Acute renal failure superimposed on stage 3b chronic kidney disease ([place]) 08/02/2021   Hematemesis 08/01/2021   Orthostasis    Seizure-like activity ([place]) 06/13/2021   Loss of weight    Primary hypertension    Chest pain of uncertain etiology 71/24/5809   Primary hyperparathyroidism ([place])  09/25/2020   Emesis    Hypophosphatemia    Hypokalemia 09/22/2020   Chronic diastolic CHF (congestive heart failure) ([place]) 09/22/2020   Noninfective gastroenteritis and colitis, unspecified 04/13/2020   Unilateral primary osteoarthritis, right knee 04/13/2020   Arthritis 04/13/2020   Bradycardia 04/13/2020   Cardiomyopathy, 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[place]) 04/13/2020   Class 1 obesity due to excess calories with body mass index (BMI) of 34.0 to 34.9 in adult 04/13/2020   Acute kidney failure, unspecified ([place]) 04/29/2019   Intractable nausea and vomiting 11/10/2018   Malnutrition of moderate degree 11/09/2018   AKI (acute kidney injury) ([place])    PE (pulmonary thromboembolism) ([place])    H/O cardiac pacemaker 10/31/2018   Syncope 10/31/2018   ST elevation on EKG without MI 10/31/2018   Chest pain with high risk of acute coronary syndrome 10/31/2018   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[place]) 10/31/2018   Type 2 diabetes mellitus with complication, with long-term current use of insulin ([place]) 10/31/2018   CKD (chronic kidney disease) stage 3, GFR 30-59 ml/min (HCC) - per patient report 10/31/2018   Presence of cardiac pacemaker 10/31/2018   Essential hypertension    Hypercalcemia 07/27/2015   Past Medical History:  Diagnosis Date   CKD (chronic kidney disease) stage 3, GFR 30-59 ml/min ([place]) 10/31/2018   - per patient report   Coronary artery disease    per patient report - 2 caths with non-obstructive CAD   Essential hypertension    Frequent falls    H/O cardiac pacemaker 10/31/2018   implanted 05/2010   Sick sinus syndrome ([place])    Type 2 diabetes mellitus with complication, with long-term current use of insulin ([place]) 10/31/2018   with diabetic nephropathy    Family History  Problem Relation Age of Onset   Breast cancer Mother    Bone cancer Mother    Heart attack Father 72   Breast cancer Sister    Other Sister        MVA   Heart disease Brother        stents   Diabetes  Brother    Colon cancer Neg Hx    Colon polyps Neg Hx     Past Surgical History:  Procedure Laterality Date   BIOPSY  09/28/2020   Procedure: BIOPSY;  Surgeon: Ronald Lobo, MD;  Location: [facility];  Service: Endoscopy;;   BIOPSY  08/03/2021   Procedure: BIOPSY;  Surgeon: Daneil Dolin, MD;  Location: [facility];  Service: Endoscopy;;   CARDIAC PACEMAKER PLACEMENT  2008   in [place] for sick sinus syndrome   CHOLECYSTECTOMY     CORONARY/GRAFT ACUTE MI REVASCULARIZATION N/A 11/09/2020   Procedure: CORONARY/GRAFT ACUTE MI REVASCULARIZATION;  Surgeon: Leonie Man, MD;  Location: [facility];  Service: Cardiovascular;  Laterality: N/A;   ESOPHAGOGASTRODUODENOSCOPY N/A 09/28/2020   Procedure: ESOPHAGOGASTRODUODENOSCOPY (EGD);  Surgeon: Ronald Lobo, MD;  Location: [facility];  Service: Endoscopy;  Laterality: N/A;   ESOPHAGOGASTRODUODENOSCOPY (EGD) WITH PROPOFOL N/A 11/10/2018   Procedure: ESOPHAGOGASTRODUODENOSCOPY (EGD) WITH  PROPOFOL;  Surgeon: Wilford Corner, MD;  Location: [facility] END[facility]COPY;  Service: Endoscopy;  Laterality: N/A;   ESOPHAGOGASTRODUODENOSCOPY (EGD) WITH PROPOFOL N/A 08/03/2021   severe reflux esophagitis, bile gastritis, felt to have delayed gastric emptying. Pathology from esophageal biopsies with fibrinopurulent exudate and granulation tissue consistent with ulcer base   LEFT HEART CATH AND CORONARY ANGIOGRAPHY N/A 10/31/2018   Procedure: LEFT HEART CATH AND CORONARY ANGIOGRAPHY;  Surgeon: Leonie Man, MD;  Location: [facility];  Service: Cardiovascular;  Laterality: N/A;   PACEMAKER GENERATOR CHANGE  09/2018   in [place]   REPLACEMENT TOTAL KNEE BILATERAL     Social History   Occupational History    Comment: retired  Tobacco Use   Smoking status: Former    Years: 3.00    Types: Cigarettes    Quit date: 12/09/1969    Years since quitting: 52.1   Smokeless tobacco: Never  Vaping Use   Vaping Use: Never used   Substance and Sexual Activity   Alcohol use: Not Currently   Drug use: Not Currently    Comment: as teenager   Sexual activity: Not on file

## 2022-02-06 ENCOUNTER — Ambulatory Visit (HOSPITAL_COMMUNITY)
Admission: RE | Admit: 2022-02-06 | Discharge: 2022-02-06 | Disposition: A | Discharge status: Home / Self Care | Payer: Medicare (Managed Care) | Source: Ambulatory Visit | Attending: Hospitalist | Admitting: Hospitalist

## 2022-02-06 DIAGNOSIS — I5032 Chronic diastolic (congestive) heart failure: Secondary | ICD-10-CM | POA: Diagnosis not present

## 2022-02-06 LAB — ECHOCARDIOGRAM COMPLETE
Area-P 1/2: 2.1 cm2
S' Lateral: 2.8 cm

## 2022-02-06 NOTE — Progress Notes (Signed)
*  PRELIMINARY RESULTS* ?Echocardiogram ?2D Echocardiogram has been performed. ? ?Melissa Lopez ?02/06/2022, 12:51 PM ?

## 2022-02-13 ENCOUNTER — Telehealth: Payer: Self-pay | Admitting: Internal Medicine

## 2022-02-13 NOTE — Telephone Encounter (Signed)
Leda Gauze at [place] told Manuela Schwartz that pt said she had already called to cancel procedure. Per chart TCS/EGD scheduled for 02/18/22 has been cancelled. ? ?FYI to Roseanne Kaufman NP ?

## 2022-02-13 NOTE — Telephone Encounter (Signed)
[facility] called to cancel patient's procedure with Dr Abbey Chatters on 3/13 and also the pre op.  ?

## 2022-02-14 ENCOUNTER — Encounter (HOSPITAL_COMMUNITY): Payer: Medicare (Managed Care)

## 2022-02-18 ENCOUNTER — Encounter (HOSPITAL_COMMUNITY): Admission: RE | Payer: Self-pay | Source: Home / Self Care

## 2022-02-18 ENCOUNTER — Ambulatory Visit (HOSPITAL_COMMUNITY): Admission: RE | Admit: 2022-02-18 | Payer: Medicare (Managed Care) | Source: Home / Self Care

## 2022-02-18 SURGERY — COLONOSCOPY WITH PROPOFOL
Anesthesia: Monitor Anesthesia Care

## 2022-03-11 ENCOUNTER — Ambulatory Visit: Payer: Self-pay

## 2022-03-11 ENCOUNTER — Ambulatory Visit (INDEPENDENT_AMBULATORY_CARE_PROVIDER_SITE_OTHER): Payer: Medicare (Managed Care) | Admitting: Physical Medicine and Rehabilitation

## 2022-03-11 ENCOUNTER — Encounter: Payer: Self-pay | Admitting: Physical Medicine and Rehabilitation

## 2022-03-11 VITALS — BP 150/90 | HR 71

## 2022-03-11 DIAGNOSIS — M5416 Radiculopathy, lumbar region: Secondary | ICD-10-CM

## 2022-03-11 MED ORDER — METHYLPREDNISOLONE ACETATE 80 MG/ML IJ SUSP
80.0000 mg | Freq: Once | INTRAMUSCULAR | Status: AC
  Administered 2022-03-11: 80 mg

## 2022-03-11 NOTE — Progress Notes (Signed)
L5 symptoms, tfPt state lower back pain that travels to her left hip and leg. Pt state sitting up makes the pain worse. Pt state she takes pain meds and uses pain cream to help ease her pain. ? ?Numeric Pain Rating Scale and Functional Assessment ?Average Pain 6 ? ? ?In the last MONTH (on 0-10 scale) has pain interfered with the following? ? ?1. General activity like being  able to carry out your everyday physical activities such as walking, climbing stairs, carrying groceries, or moving a chair?  ?Rating(9) ? ? ?+Driver, +BT, -Dye Allergies. ? ?

## 2022-03-11 NOTE — Patient Instructions (Signed)

## 2022-03-14 IMAGING — CT CT ABD-PELV W/O CM
2 of 4 series · 15 of 46 positions shown, 17 images · non-contrast
Comparison: Renal ultrasound 11/01/2018

CLINICAL DATA: Constipation with vomiting. History of colitis and
contrast allergy. Suspected bowel obstruction.

EXAM:
CT ABDOMEN AND PELVIS WITHOUT CONTRAST
TECHNIQUE: Multidetector CT imaging of the abdomen and pelvis was performed
following the standard protocol without IV contrast.

[Series 2: axial st · axial · 0.95mm/px · z∈[+483,+933]mm · 12 of 103 slices shown, 14 images]
[im 7/103  soft-tissue]
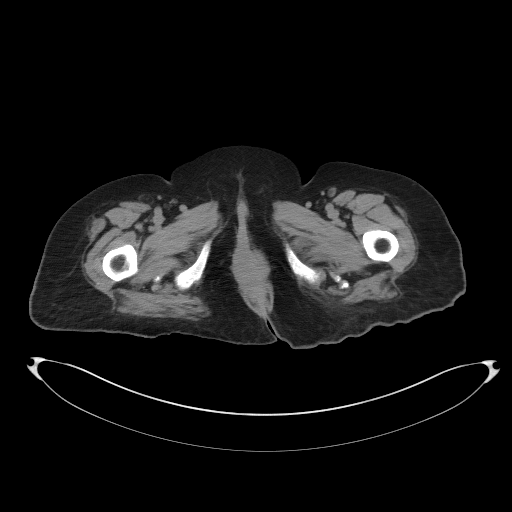
[im 7/103  bone]
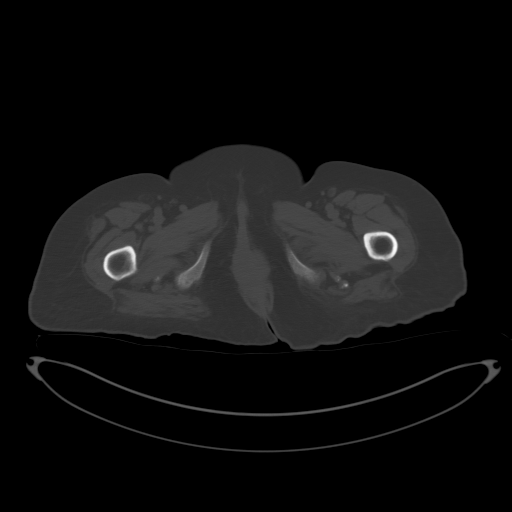
[im 19/103  soft-tissue]
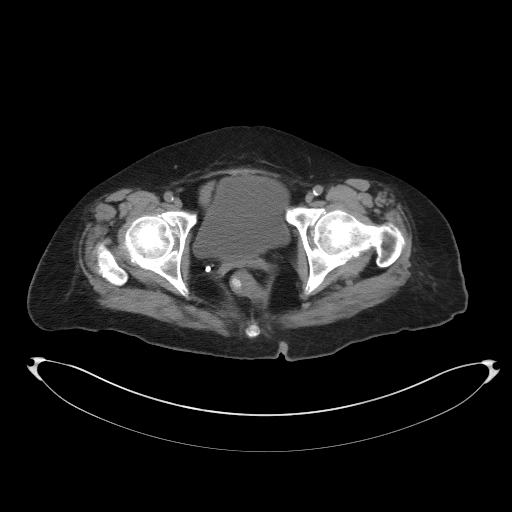
[im 25/103  soft-tissue]
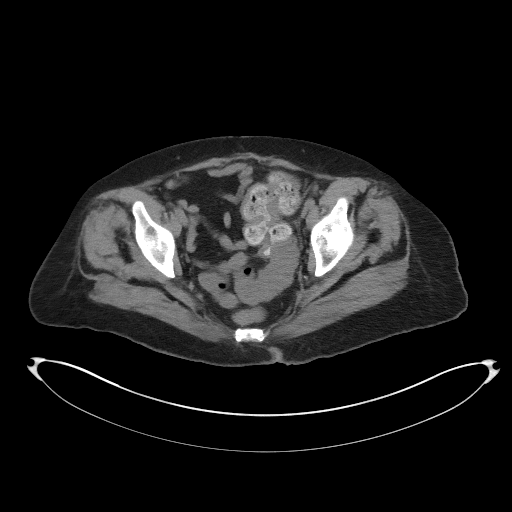
[im 31/103  soft-tissue]
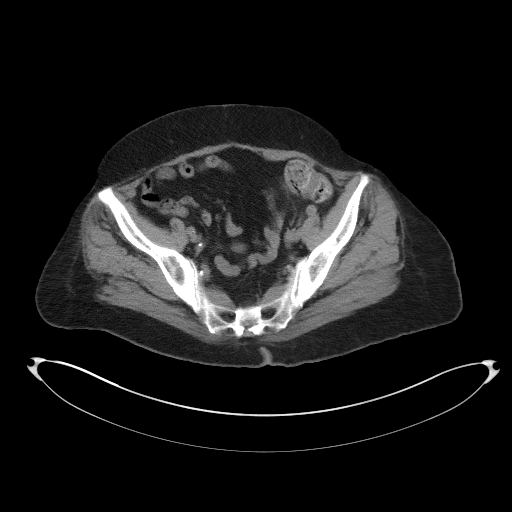
[im 43/103  soft-tissue]
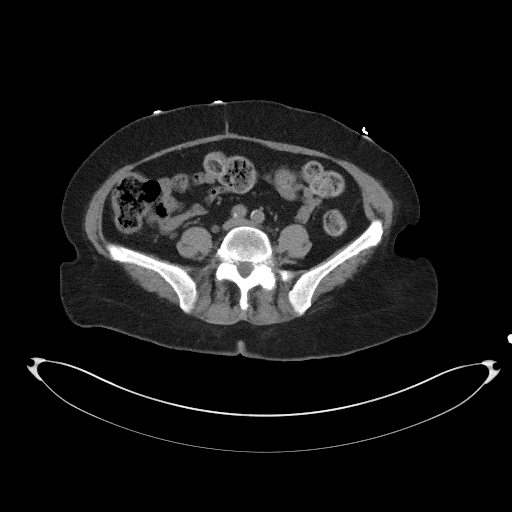
[im 49/103  soft-tissue]
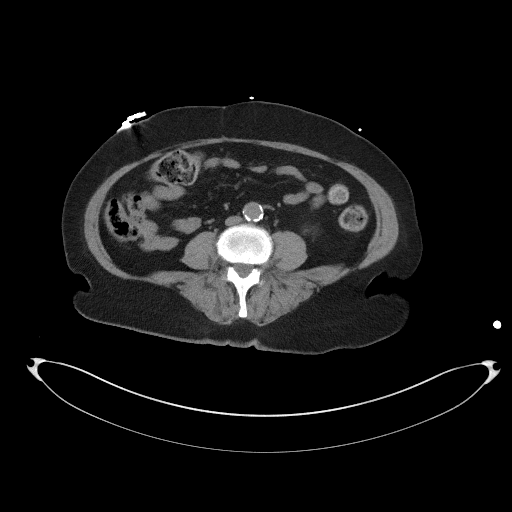
[im 55/103  soft-tissue]
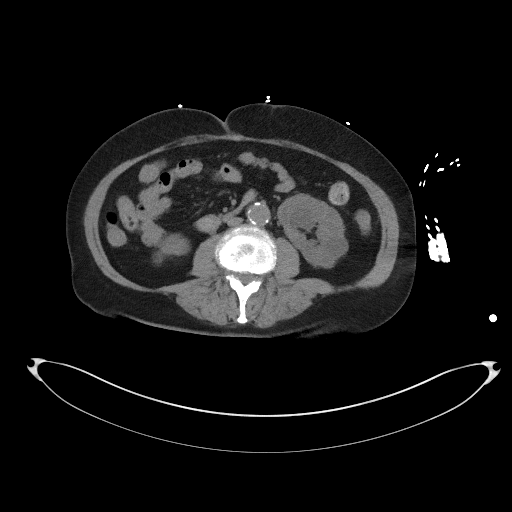
[im 67/103  soft-tissue]
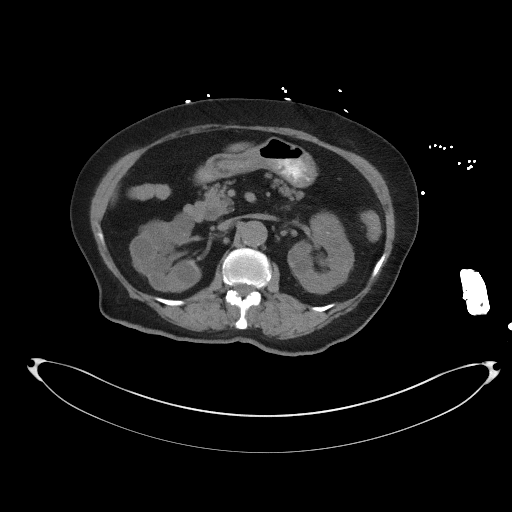
[im 73/103  soft-tissue]
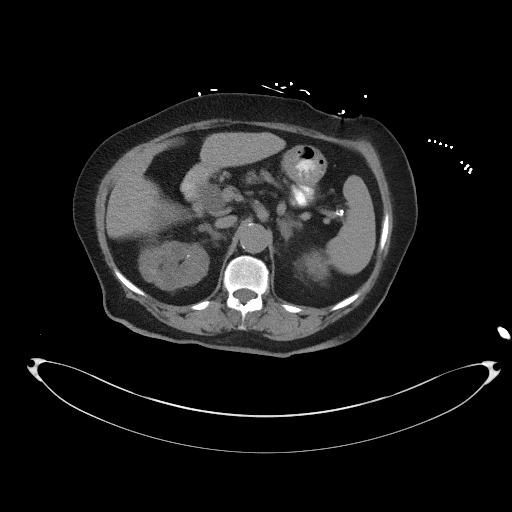
[im 73/103  bone]
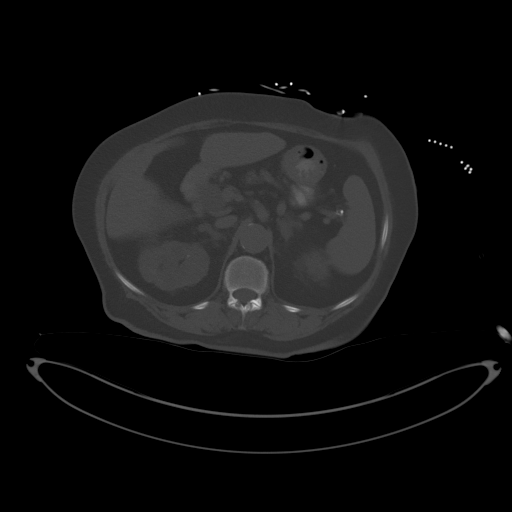
[im 79/103  soft-tissue]
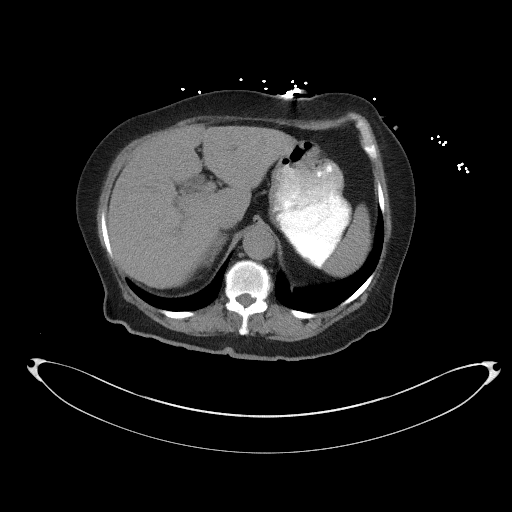
[im 91/103  soft-tissue]
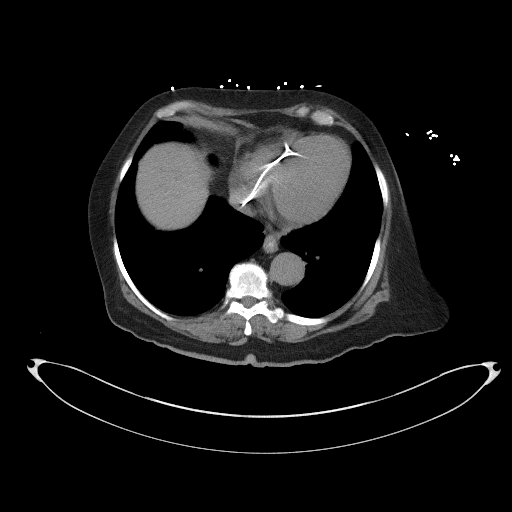
[im 97/103  soft-tissue]
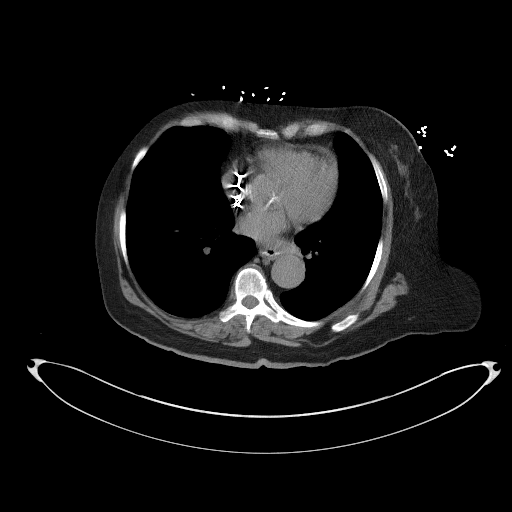

[Series 5: coronal st · coronal · 0.78mm/px · 3 of 93 slices shown]
[im 31/93  soft-tissue]
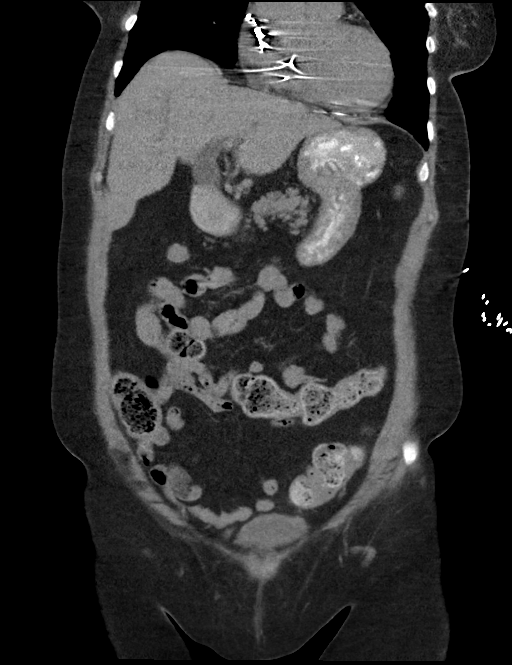
[im 41/93  soft-tissue]
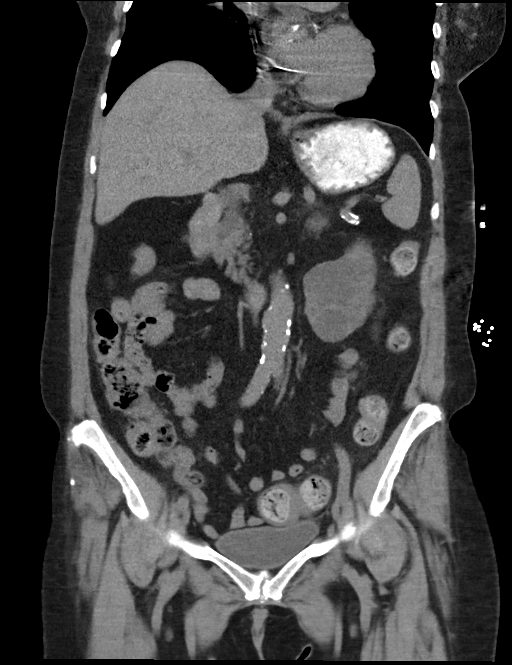
[im 52/93  soft-tissue]
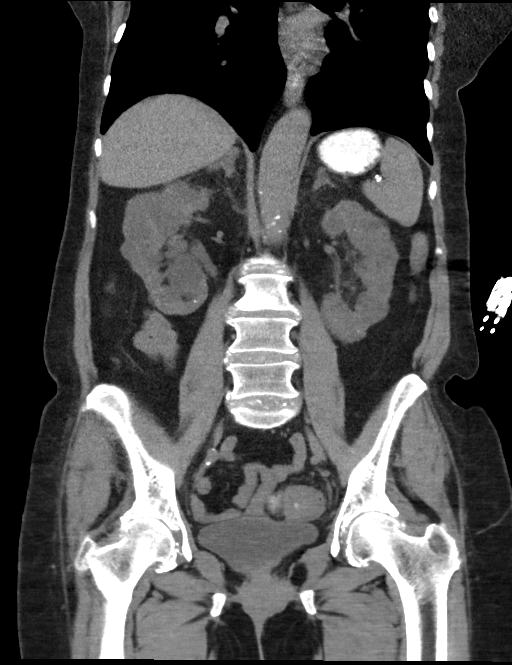

[15 of 46 positions shown; findings below may reference images not displayed]

FINDINGS: Lower chest: Linear scarring or atelectasis at the left lung base.
Patient has a pacemaker. There is no significant pleural or
pericardial effusion. Aortic and coronary artery atherosclerosis
noted.

Hepatobiliary: There is moderate intrahepatic and extrahepatic
biliary dilatation post cholecystectomy. The common hepatic duct
measures up to 18 mm in diameter. No evidence of calcified
choledocholithiasis. The liver otherwise appears unremarkable as
imaged in the noncontrast state.

Pancreas: Mildly atrophied. No evidence of pancreatic ductal
dilatation, mass lesion or surrounding inflammation.

Spleen: Normal in size without focal abnormality.

Adrenals/Urinary Tract: Both adrenal glands appear normal. Both
kidneys are within normal limits for size. There are innumerable
low-density renal lesions bilaterally which are incompletely
characterized without contrast, but likely cysts. Some of these are
associated with thin septations. No evidence of hydronephrosis,
perinephric soft tissue stranding or ureteral calculus. The bladder
appears normal.

Stomach/Bowel: A small amount of enteric contrast was administered.
The stomach appears normal. There is no small or large bowel
distension, wall thickening or surrounding inflammatory change. Some
high density material is present within the sigmoid colon. The
appendix is not clearly visualized. There is no pericecal
inflammation.

Vascular/Lymphatic: There are no enlarged abdominal or pelvic lymph
nodes. Aortic and branch vessel atherosclerosis without evidence of
aneurysm.

Reproductive: Hysterectomy.  No adnexal mass.

Other: Postsurgical changes in the low anterior abdominal wall. No
evidence of hernia or ascites.

Musculoskeletal: No acute or significant osseous findings. Mild
degenerative changes in the lumbar spine with associated probable
nonacute Schmorl's node formation in the inferior endplate of L3.
IMPRESSION: 1. No acute findings or explanation for the patient's symptoms. No
evidence of bowel obstruction or perforation.
2. Intrahepatic and extrahepatic biliary dilatation post
cholecystectomy, likely physiologic. Correlation with liver function
studies recommended.
3. Innumerable low-density renal lesions bilaterally, incompletely
characterized without contrast, but likely cysts as correlated with
prior ultrasound. Given history renal failure, findings suggest
polycystic kidney disease.
4. Aortic Atherosclerosis ([id number]).

## 2022-03-15 IMAGING — DX DG CHEST 2V
2 series · 2 of 2 positions shown · non-contrast
Comparison: November 01, 2018

CLINICAL DATA: Chest pain

EXAM:
CHEST - 2 VIEW

[chest lat]
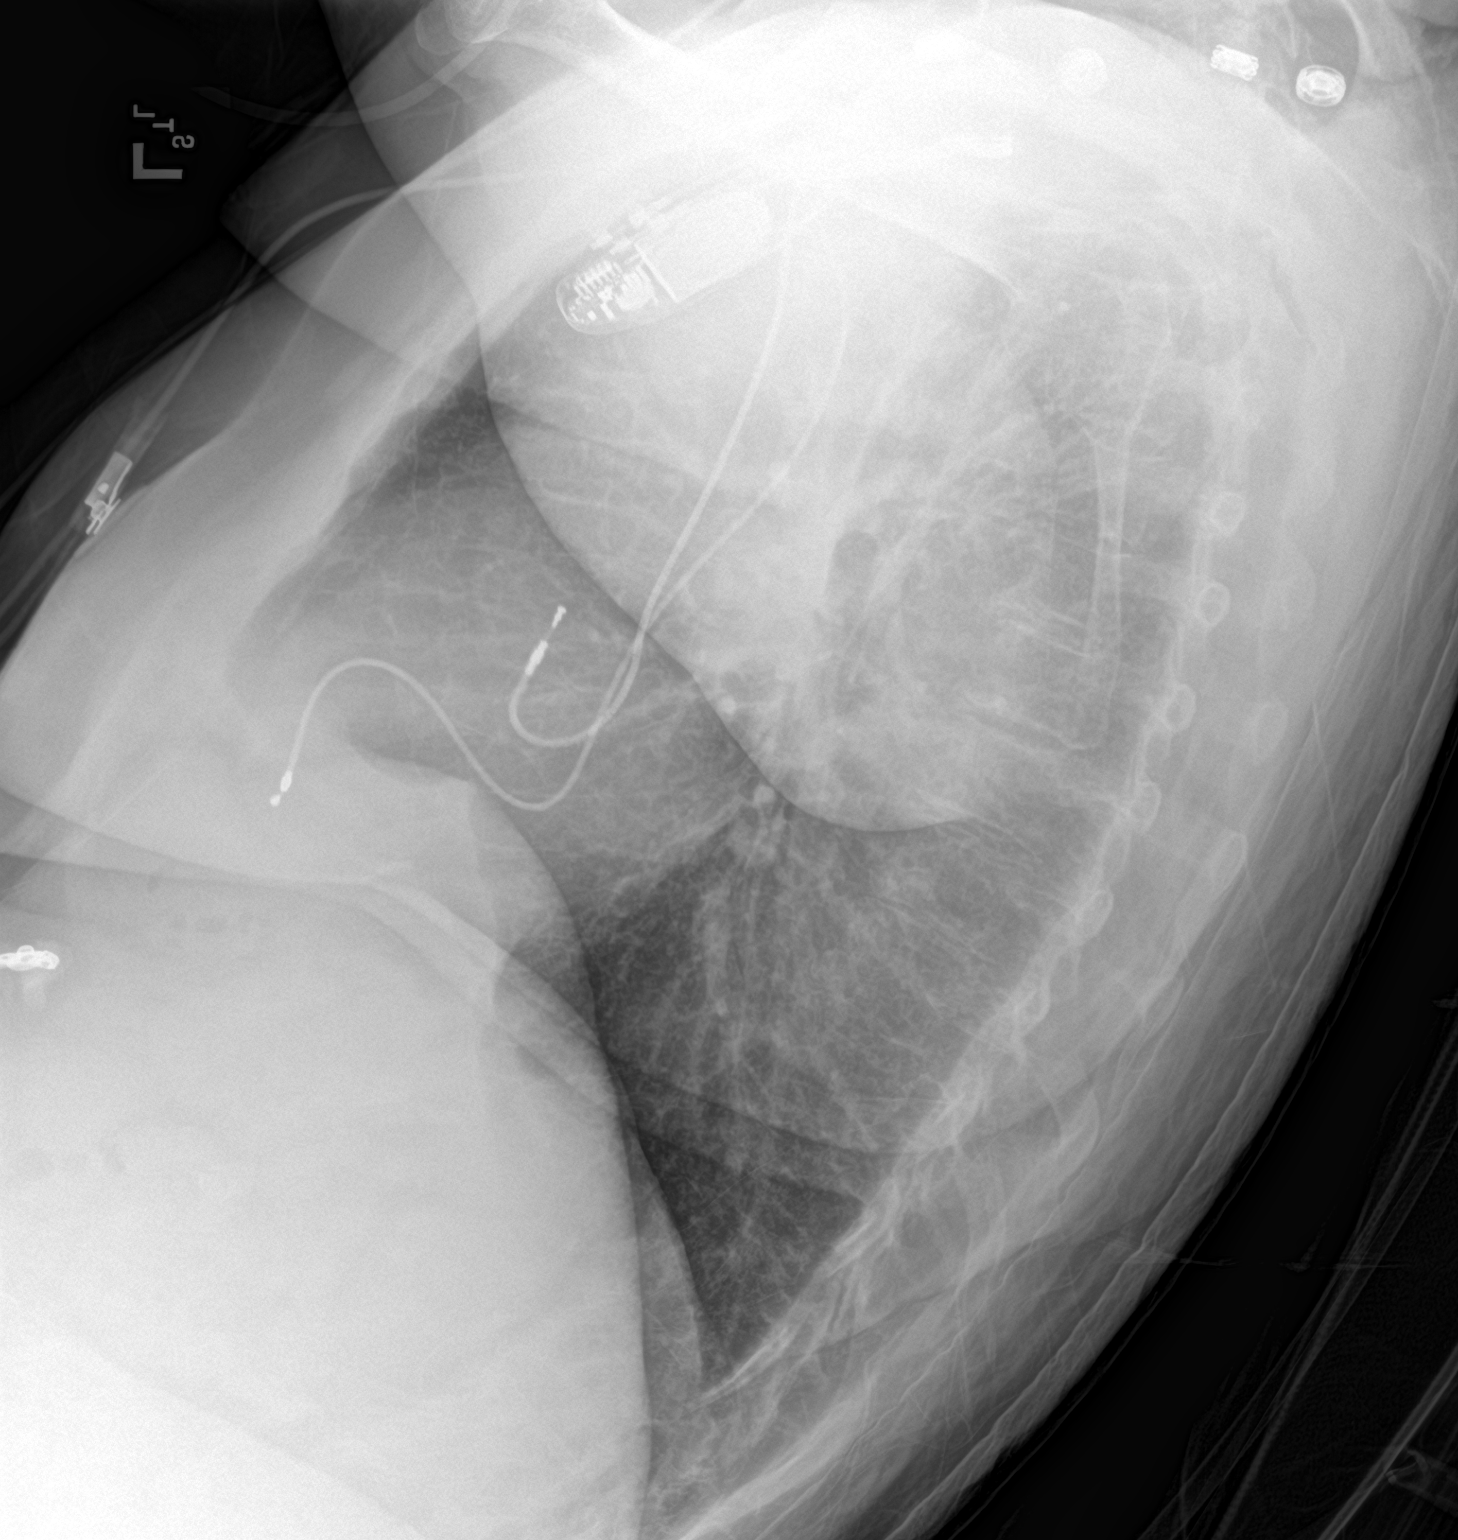

[chest ap]
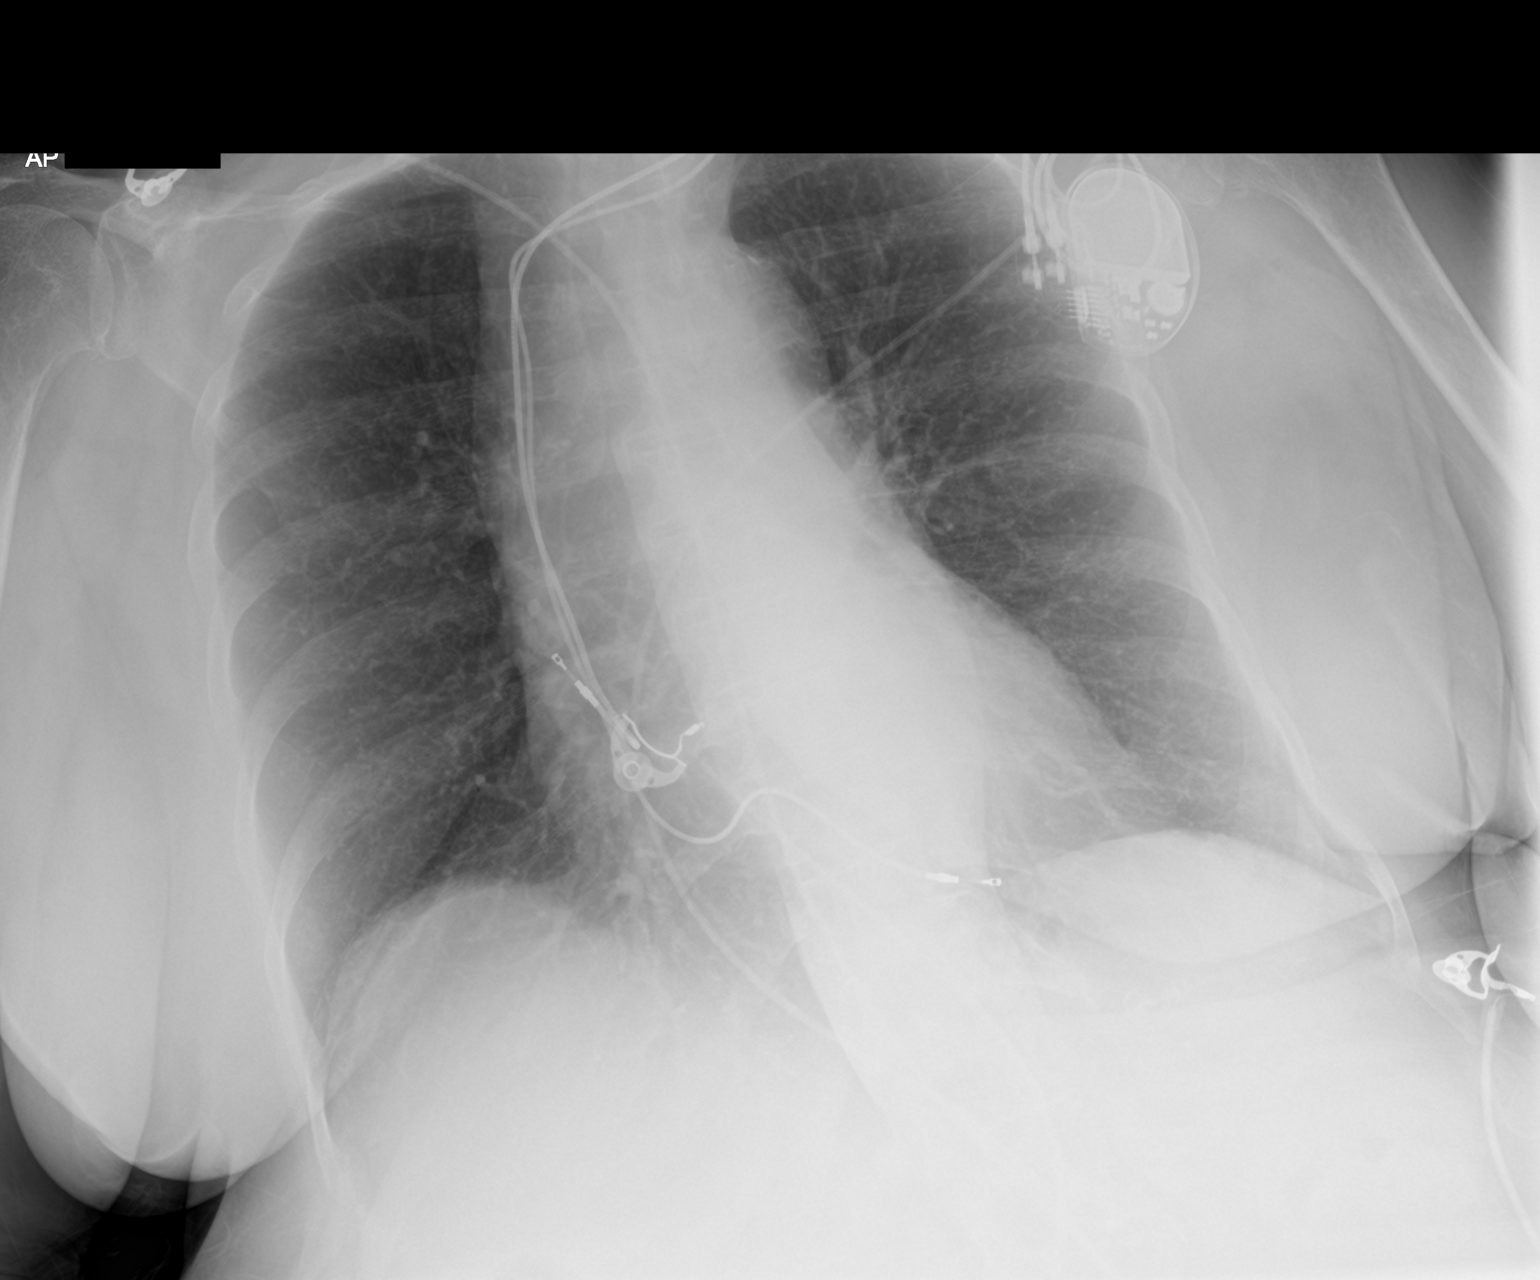

[2 of 2 positions shown; findings below may reference images not displayed]

FINDINGS: Lungs are clear. Heart size and pulmonary vascularity are normal. No
adenopathy. Pacemaker leads are attached to the right atrium and
right ventricle. No pneumothorax. No bone lesions.
IMPRESSION: Pacemaker leads attached to right atrium and right ventricle. Heart
size normal. Lungs clear.

## 2022-04-02 NOTE — Progress Notes (Signed)
? ?Melissa Lopez - 71 y.o. female MRN [id number]  Date of birth: [birth year] ? ?Office Visit Note: ?Visit Date: 03/11/2022 ?PCP: [facility] [place] ?Referred by: [facility], [place]: ?Chief Complaint  ?Patient presents with  ? Lower Back - Pain  ? Left Hip - Pain  ? Left Leg - Pain  ? ?HPI:  Melissa Lopez is a 71 y.o. female who comes in today at the request of Dr. Anderson Malta for planned Left L5-S1 Lumbar Transforaminal epidural steroid injection with fluoroscopic guidance.  The patient has failed conservative care including home exercise, medications, time and activity modification.  This injection will be diagnostic and hopefully therapeutic.  Please see requesting physician notes for further details and justification.  Patient had prior L3 transforaminal injection.  Symptoms are more related to the L5 nerve root today on exam. ? ?ROS Otherwise per HPI. ? ?Assessment & Plan: ?Visit Diagnoses:  ?  ICD-10-CM   ?1. Lumbar radiculopathy  M54.16 XR C-ARM NO REPORT  ?  Epidural Steroid injection  ?  methylPREDNISolone acetate (DEPO-MEDROL) injection 80 mg  ?  ?  ?Plan: No additional findings.  ? ?Meds & Orders:  ?Meds ordered this encounter  ?Medications  ? methylPREDNISolone acetate (DEPO-MEDROL) injection 80 mg  ?  ?Orders Placed This Encounter  ?Procedures  ? XR C-ARM NO REPORT  ? Epidural Steroid injection  ?  ?Follow-up: Return if symptoms worsen or fail to improve.  ? ?Procedures: ?No procedures performed  ?Lumbosacral Transforaminal Epidural Steroid Injection - Sub-Pedicular Approach with Fluoroscopic Guidance ? ?Patient: Melissa Lopez      ?Date of Birth: 1951/01/10 ?MRN: [id number] ?PCP: [facility] [place]      ?Visit Date: 03/11/2022 ?  ?Universal Protocol:    ?Date/Time: 03/11/2022 ? ?Consent Given By: the patient ? ?Position: PRONE ? ?Additional Comments: ?Vital signs were monitored before and after the procedure. ?Patient was  prepped and draped in the usual sterile fashion. ?The correct patient, procedure, and site was verified. ? ? ?Injection Procedure Details:  ? ?Procedure diagnoses: Lumbar radiculopathy [M54.16]   ? ?Meds Administered:  ?Meds ordered this encounter  ?Medications  ? methylPREDNISolone acetate (DEPO-MEDROL) injection 80 mg  ? ? ?Laterality: Left ? ?Location/Site: L5 ? ?Needle:5.0 in., 22 ga.  Short bevel or Quincke spinal needle ? ?Needle Placement: Transforaminal ? ?Findings: ?  ? -Comments: Excellent flow of contrast along the nerve, nerve root and into the epidural space. ? ?Procedure Details: ?After squaring off the end-plates to get a true AP view, the C-arm was positioned so that an oblique view of the foramen as noted above was visualized. The target area is just inferior to the "nose of the scotty dog" or sub pedicular. The soft tissues overlying this structure were infiltrated with 2-3 ml. of 1% Lidocaine without Epinephrine. ? ?The spinal needle was inserted toward the target using a "trajectory" view along the fluoroscope beam.  Under AP and lateral visualization, the needle was advanced so it did not puncture dura and was located close the 6 O'Clock position of the pedical in AP tracterory. Biplanar projections were used to confirm position. Aspiration was confirmed to be negative for CSF and/or blood. A 1-2 ml. volume of Isovue-250 was injected and flow of contrast was noted at each level. Radiographs were obtained for documentation purposes.  ? ?After attaining the desired flow of contrast documented above, a 0.5 to 1.0 ml test dose of 0.25% Marcaine was  injected into each respective transforaminal space.  The patient was observed for 90 seconds post injection.  After no sensory deficits were reported, and normal lower extremity motor function was noted,   the above injectate was administered so that equal amounts of the injectate were placed at each foramen (level) into the transforaminal epidural  space. ? ? ?Additional Comments:  ?No complications occurred ?Dressing: 2 x 2 sterile gauze and Band-Aid ?  ? ?Post-procedure details: ?Patient was observed during the procedure. ?Post-procedure instructions were reviewed. ? ?Patient left the clinic in stable condition. ?  ? ?Clinical History: ?CT LUMBAR SPINE WITHOUT CONTRAST  ?   ?TECHNIQUE:  ?Multidetector CT imaging of the lumbar spine was performed without  ?intravenous contrast administration. Multiplanar CT image  ?reconstructions were also generated.  ?   ?COMPARISON:  None.  ?   ?FINDINGS:  ?Segmentation: 5 lumbar type vertebrae.  ?   ?Alignment: Normal.  ?   ?Vertebrae: No acute fracture or focal pathologic process.  ?   ?Paraspinal and other soft tissues: Negative.  ?   ?Disc levels:  ?   ?L3-4: Moderate spinal canal stenosis. Moderate right and mild left  ?neural foraminal stenosis.  ?   ?L4-5: Small disc bulge. No spinal canal or neural foraminal  ?stenosis.  ?   ?L5-S1: Moderate facet hypertrophy and small disc bulge. Severe  ?bilateral neural foraminal stenosis.  ?   ?IMPRESSION:  ?1. No acute fracture or static subluxation of the lumbar spine.  ?2. L3-4 moderate spinal canal stenosis and moderate right and mild  ?left neural foraminal stenosis.  ?3. Severe bilateral L5-S1 neural foraminal stenosis.  ?   ?   ?Electronically Signed  ?  By: Ulyses Jarred M.D.  ?  On: 06/13/2021 03:38  ? ? ? ?Objective:  VS:  HT:    WT:   BMI:     BP:(!) 150/90  HR:71bpm  TEMP: ( )  RESP:  ?Physical Exam ?Vitals and nursing note reviewed.  ?Constitutional:   ?   General: She is not in acute distress. ?   Appearance: Normal appearance. She is not ill-appearing.  ?HENT:  ?   Head: Normocephalic and atraumatic.  ?   Right Ear: External ear normal.  ?   Left Ear: External ear normal.  ?Eyes:  ?   Extraocular Movements: Extraocular movements intact.  ?Cardiovascular:  ?   Rate and Rhythm: Normal rate.  ?   Pulses: Normal pulses.  ?Pulmonary:  ?   Effort: Pulmonary  effort is normal. No respiratory distress.  ?Abdominal:  ?   General: There is no distension.  ?   Palpations: Abdomen is soft.  ?Musculoskeletal:     ?   General: Tenderness present.  ?   Cervical back: Neck supple.  ?   Right lower leg: No edema.  ?   Left lower leg: No edema.  ?   Comments: Patient has good distal strength with no pain over the greater trochanters.  No clonus or focal weakness.  ?Skin: ?   Findings: No erythema, lesion or rash.  ?Neurological:  ?   General: No focal deficit present.  ?   Mental Status: She is alert and oriented to person, place, and time.  ?   Sensory: No sensory deficit.  ?   Motor: No weakness or abnormal muscle tone.  ?   Coordination: Coordination normal.  ?Psychiatric:     ?   Mood and Affect: Mood normal.     ?  Behavior: Behavior normal.  ?  ? ?Imaging: ?No results found. ?

## 2022-04-02 NOTE — Procedures (Signed)
Lumbosacral Transforaminal Epidural Steroid Injection - Sub-Pedicular Approach with Fluoroscopic Guidance ? ?Patient: Melissa Lopez      ?Date of Birth: [birth year] ?MRN: [id number] ?PCP: [facility], [facility]      ?Visit Date: 03/11/2022 ?  ?Universal Protocol:    ?Date/Time: 03/11/2022 ? ?Consent Given By: the patient ? ?Position: PRONE ? ?Additional Comments: ?Vital signs were monitored before and after the procedure. ?Patient was prepped and draped in the usual sterile fashion. ?The correct patient, procedure, and site was verified. ? ? ?Injection Procedure Details:  ? ?Procedure diagnoses: Lumbar radiculopathy [M54.16]   ? ?Meds Administered:  ?Meds ordered this encounter  ?Medications  ? methylPREDNISolone acetate (DEPO-MEDROL) injection 80 mg  ? ? ?Laterality: Left ? ?Location/Site: L5 ? ?Needle:5.0 in., 22 ga.  Short bevel or Quincke spinal needle ? ?Needle Placement: Transforaminal ? ?Findings: ?  ? -Comments: Excellent flow of contrast along the nerve, nerve root and into the epidural space. ? ?Procedure Details: ?After squaring off the end-plates to get a true AP view, the C-arm was positioned so that an oblique view of the foramen as noted above was visualized. The target area is just inferior to the "nose of the scotty dog" or sub pedicular. The soft tissues overlying this structure were infiltrated with 2-3 ml. of 1% Lidocaine without Epinephrine. ? ?The spinal needle was inserted toward the target using a "trajectory" view along the fluoroscope beam.  Under AP and lateral visualization, the needle was advanced so it did not puncture dura and was located close the 6 O'Clock position of the pedical in AP tracterory. Biplanar projections were used to confirm position. Aspiration was confirmed to be negative for CSF and/or blood. A 1-2 ml. volume of Isovue-250 was injected and flow of contrast was noted at each level. Radiographs were obtained for documentation purposes.  ? ?After  attaining the desired flow of contrast documented above, a 0.5 to 1.0 ml test dose of 0.25% Marcaine was injected into each respective transforaminal space.  The patient was observed for 90 seconds post injection.  After no sensory deficits were reported, and normal lower extremity motor function was noted,   the above injectate was administered so that equal amounts of the injectate were placed at each foramen (level) into the transforaminal epidural space. ? ? ?Additional Comments:  ?No complications occurred ?Dressing: 2 x 2 sterile gauze and Band-Aid ?  ? ?Post-procedure details: ?Patient was observed during the procedure. ?Post-procedure instructions were reviewed. ? ?Patient left the clinic in stable condition. ? ?

## 2022-04-11 ENCOUNTER — Ambulatory Visit (INDEPENDENT_AMBULATORY_CARE_PROVIDER_SITE_OTHER): Payer: Medicare (Managed Care)

## 2022-04-11 DIAGNOSIS — I495 Sick sinus syndrome: Secondary | ICD-10-CM

## 2022-04-11 LAB — CUP PACEART REMOTE DEVICE CHECK
Battery Remaining Longevity: 84 mo
Battery Remaining Percentage: 66 %
Battery Voltage: 3.01 V
Brady Statistic AP VP Percent: 14 %
Brady Statistic AP VS Percent: 59 %
Brady Statistic AS VP Percent: 3 %
Brady Statistic AS VS Percent: 23 %
Brady Statistic RA Percent Paced: 72 %
Brady Statistic RV Percent Paced: 17 %
Date Time Interrogation Session: 20230504020014
Implantable Lead Implant Date: 20110113
Implantable Lead Implant Date: 20110113
Implantable Lead Location: 753859
Implantable Lead Location: 753860
Implantable Pulse Generator Implant Date: 20191008
Lead Channel Impedance Value: 450 Ohm
Lead Channel Impedance Value: 650 Ohm
Lead Channel Pacing Threshold Amplitude: 0.625 V
Lead Channel Pacing Threshold Amplitude: 1.125 V
Lead Channel Pacing Threshold Pulse Width: 0.4 ms
Lead Channel Pacing Threshold Pulse Width: 0.4 ms
Lead Channel Sensing Intrinsic Amplitude: 12 mV
Lead Channel Sensing Intrinsic Amplitude: 5 mV
Lead Channel Setting Pacing Amplitude: 1.375
Lead Channel Setting Pacing Amplitude: 1.625
Lead Channel Setting Pacing Pulse Width: 0.4 ms
Lead Channel Setting Sensing Sensitivity: 2 mV
Pulse Gen Model: 2272
Pulse Gen Serial Number: 9064940

## 2022-04-25 NOTE — Progress Notes (Signed)
Remote pacemaker transmission.   

## 2022-05-07 ENCOUNTER — Other Ambulatory Visit (HOSPITAL_COMMUNITY): Payer: Self-pay

## 2022-05-07 ENCOUNTER — Other Ambulatory Visit (HOSPITAL_COMMUNITY): Payer: Self-pay | Admitting: Hospitalist

## 2022-05-07 DIAGNOSIS — E2839 Other primary ovarian failure: Secondary | ICD-10-CM

## 2022-05-07 DIAGNOSIS — Z1231 Encounter for screening mammogram for malignant neoplasm of breast: Secondary | ICD-10-CM

## 2022-05-09 ENCOUNTER — Other Ambulatory Visit: Payer: Medicare (Managed Care)

## 2022-05-09 ENCOUNTER — Ambulatory Visit: Payer: Medicare (Managed Care)

## 2022-05-24 ENCOUNTER — Other Ambulatory Visit (HOSPITAL_COMMUNITY): Payer: Self-pay | Admitting: Family Medicine

## 2022-05-24 DIAGNOSIS — Z1231 Encounter for screening mammogram for malignant neoplasm of breast: Secondary | ICD-10-CM

## 2022-06-21 ENCOUNTER — Other Ambulatory Visit (HOSPITAL_COMMUNITY): Payer: Medicare (Managed Care)

## 2022-07-11 ENCOUNTER — Ambulatory Visit (INDEPENDENT_AMBULATORY_CARE_PROVIDER_SITE_OTHER): Payer: Medicare (Managed Care)

## 2022-07-11 DIAGNOSIS — I495 Sick sinus syndrome: Secondary | ICD-10-CM | POA: Diagnosis not present

## 2022-07-11 LAB — CUP PACEART REMOTE DEVICE CHECK
Battery Remaining Longevity: 82 mo
Battery Remaining Percentage: 64 %
Battery Voltage: 2.99 V
Brady Statistic AP VP Percent: 13 %
Brady Statistic AP VS Percent: 55 %
Brady Statistic AS VP Percent: 4 %
Brady Statistic AS VS Percent: 26 %
Brady Statistic RA Percent Paced: 66 %
Brady Statistic RV Percent Paced: 17 %
Date Time Interrogation Session: 20230803020013
Implantable Lead Implant Date: 20110113
Implantable Lead Implant Date: 20110113
Implantable Lead Location: 753859
Implantable Lead Location: 753860
Implantable Pulse Generator Implant Date: 20191008
Lead Channel Impedance Value: 450 Ohm
Lead Channel Impedance Value: 640 Ohm
Lead Channel Pacing Threshold Amplitude: 0.625 V
Lead Channel Pacing Threshold Amplitude: 0.875 V
Lead Channel Pacing Threshold Pulse Width: 0.4 ms
Lead Channel Pacing Threshold Pulse Width: 0.4 ms
Lead Channel Sensing Intrinsic Amplitude: 12 mV
Lead Channel Sensing Intrinsic Amplitude: 5 mV
Lead Channel Setting Pacing Amplitude: 1.125
Lead Channel Setting Pacing Amplitude: 1.625
Lead Channel Setting Pacing Pulse Width: 0.4 ms
Lead Channel Setting Sensing Sensitivity: 2 mV
Pulse Gen Model: 2272
Pulse Gen Serial Number: 9064940

## 2022-07-17 ENCOUNTER — Telehealth: Payer: Self-pay | Admitting: Cardiology

## 2022-07-17 ENCOUNTER — Ambulatory Visit: Payer: Medicare (Managed Care) | Admitting: Cardiology

## 2022-07-17 NOTE — Telephone Encounter (Signed)
Melissa Lopez from [place] returning a call.

## 2022-07-17 NOTE — Progress Notes (Deleted)
Clinical Summary Ms. Woolford is a 71 y.o.female  1.CAD - OM1 occluded with otherwise moderate nonobstructive disease by prior caths 2019 and 2021. 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 CKD III  4. HTn   5. DM2  6. 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 Apical hypertrophy suggesting HCM   Why on eliquis? Past Medical History:  Diagnosis Date   CKD (chronic kidney disease) stage 3, GFR 30-59 ml/min (HCC) 10/31/2018   - per patient report   Coronary artery disease    per patient report - 2 caths with non-obstructive CAD   Essential hypertension    Frequent falls    H/O cardiac pacemaker 10/31/2018   implanted 05/2010   Sick sinus syndrome ([place])    Type 2 diabetes mellitus with complication, with long-term current use of insulin ([place]) 10/31/2018   with diabetic nephropathy     Allergies  Allergen Reactions   Contrast Media [Iodinated Contrast Media] Swelling    Contrast Dye - the one made out of shellfish, gives a really bad reaction - swelling   Oxycodone-Acetaminophen Anaphylaxis   Xarelto  [Rivaroxaban] Swelling   Oxycodone Other (See Comments)    Statused by Person:  EAST, CAMELIA(CE) on [id number]     Current Outpatient Medications  Medication Sig Dispense Refill   acetaminophen (TYLENOL) 325 MG tablet Take 650 mg by mouth in the morning, at noon, and at bedtime.     apixaban (ELIQUIS) 2.5 MG TABS tablet Take 2.5 mg by mouth 2 (two) times daily.     aspirin 81 MG chewable tablet Chew 1 tablet (81 mg total) by mouth daily. 30 tablet 0   carvedilol (COREG) 3.125 MG tablet Take 1 tablet (3.125 mg total) by mouth 2 (two) times daily. 180 tablet 3   celecoxib (CELEBREX) 50 MG capsule Take 50 mg by mouth 2 (two) times daily.     cinacalcet (SENSIPAR) 30 MG tablet Take 1 tablet (30 mg total) by mouth 2 (two) times daily with a meal. (Patient taking  differently: Take 45 mg by mouth in the morning.) 60 tablet 1   cyclobenzaprine (FLEXERIL) 5 MG tablet Take 5 mg by mouth at bedtime.     FLUoxetine (PROZAC) 20 MG tablet Take 40 mg by mouth at bedtime.     furosemide (LASIX) 40 MG tablet Take 80 mg by mouth in the morning.     gabapentin (NEURONTIN) 300 MG capsule Take 300 mg by mouth at bedtime.     HYDROcodone-acetaminophen (NORCO/VICODIN) 5-325 MG tablet Take 1 tablet by mouth every 6 (six) hours as needed for moderate pain. (Patient taking differently: Take 1 tablet by mouth 2 (two) times daily.) 15 tablet 0   lidocaine (LIDODERM) 5 % Place 2 patches onto the skin daily as needed (pain (hip & back)). (Apply hip and back )Remove & Discard patch within 12 hours or as directed by MD     linaclotide (LINZESS) 290 MCG CAPS capsule Take 1 capsule (290 mcg total) by mouth daily before breakfast. 30 capsule 1   Menthol, Topical Analgesic, (BIOFREEZE EX) Apply 1 application topically 3 (three) times daily as needed (pain.).     metoCLOPramide (REGLAN) 10 MG tablet Take 10 mg by mouth in the morning and at bedtime.     metoCLOPramide (REGLAN) 5 MG tablet Take 1 tablet (5 mg total) by mouth 3 (three) times daily  before meals. 90 tablet 1   mirtazapine (REMERON) 7.5 MG tablet Take 7.5 mg by mouth at bedtime.     MULTIPLE VITAMIN-FOLIC ACID PO Take 1 tablet by mouth in the morning.     Nutritional Supplements (,FEEDING SUPPLEMENT, PROSOURCE PLUS) liquid Take 30 mLs by mouth daily. 887 mL 0   pantoprazole (PROTONIX) 40 MG tablet Take 1 tablet (40 mg total) by mouth 2 (two) times daily before a meal. 60 tablet 1   polyethylene glycol (MIRALAX / GLYCOLAX) 17 g packet Take 17 g by mouth 2 (two) times daily. (Patient taking differently: Take 17 g by mouth daily as needed (constipation.).) 14 each 0   polyethylene glycol-electrolytes (TRILYTE) 420 g solution Take 4,000 mLs by mouth as directed. 4000 mL 0   rosuvastatin (CRESTOR) 20 MG tablet Take 20 mg by mouth  in the morning.     senna-docusate (SENOKOT-S) 8.6-50 MG tablet Take 1 tablet by mouth 2 (two) times daily.     traZODone (DESYREL) 50 MG tablet Take 50 mg by mouth at bedtime.     Vibegron (GEMTESA) 75 MG TABS Take 75 mg by mouth in the morning.     Vitamin D, Ergocalciferol, (DRISDOL) 1.25 MG (50000 UNIT) CAPS capsule Take 50,000 Units by mouth every Sunday.     No current facility-administered medications for this visit.     Past Surgical History:  Procedure Laterality Date   BIOPSY  09/28/2020   Procedure: BIOPSY;  Surgeon: Ronald Lobo, MD;  [facility];  Service: Endoscopy;;   BIOPSY  08/03/2021   Procedure: BIOPSY;  Surgeon: Daneil Dolin, MD;  [facility];  Service: Endoscopy;;   CARDIAC PACEMAKER PLACEMENT  2008   in [place] for sick sinus syndrome   CHOLECYSTECTOMY     CORONARY/GRAFT ACUTE MI REVASCULARIZATION N/A 11/09/2020   Procedure: CORONARY/GRAFT ACUTE MI REVASCULARIZATION;  Surgeon: Leonie Man, MD;  Location: [facility];  Service: Cardiovascular;  Laterality: N/A;   ESOPHAGOGASTRODUODENOSCOPY N/A 09/28/2020   Procedure: ESOPHAGOGASTRODUODENOSCOPY (EGD);  Surgeon: Ronald Lobo, MD;  Location: [facility];  Service: Endoscopy;  Laterality: N/A;   ESOPHAGOGASTRODUODENOSCOPY (EGD) WITH PROPOFOL N/A 11/10/2018   Procedure: ESOPHAGOGASTRODUODENOSCOPY (EGD) WITH PROPOFOL;  Surgeon: Wilford Corner, MD;  Location: [facility];  Service: Endoscopy;  Laterality: N/A;   ESOPHAGOGASTRODUODENOSCOPY (EGD) WITH PROPOFOL N/A 08/03/2021   severe reflux esophagitis, bile gastritis, felt to have delayed gastric emptying. Pathology from esophageal biopsies with fibrinopurulent exudate and granulation tissue consistent with ulcer base   LEFT HEART CATH AND CORONARY ANGIOGRAPHY N/A 10/31/2018   Procedure: LEFT HEART CATH AND CORONARY ANGIOGRAPHY;  Surgeon: Leonie Man, MD;  Location: [facility];  Service: Cardiovascular;   Laterality: N/A;   PACEMAKER GENERATOR CHANGE  09/2018   in [place]   REPLACEMENT TOTAL KNEE BILATERAL       Allergies  Allergen Reactions   Contrast Media [Iodinated Contrast Media] Swelling    Contrast Dye - the one made out of shellfish, gives a really bad reaction - swelling   Oxycodone-Acetaminophen Anaphylaxis   Xarelto  [Rivaroxaban] Swelling   Oxycodone Other (See Comments)    Statused by Person:  EAST, CAMELIA(CE) on [id number]      Family History  Problem Relation Age of Onset   Breast cancer Mother    Bone cancer Mother    Heart attack Father 54   Breast cancer Sister    Other Sister  MVA   Heart disease Brother        stents   Diabetes Brother    Colon cancer Neg Hx    Colon polyps Neg Hx      Social History Ms. Peschke reports that she quit smoking about 52 years ago. Her smoking use included cigarettes. She has never used smokeless tobacco. Ms. Turko reports that she does not currently use alcohol.   Review of Systems CONSTITUTIONAL: No weight loss, fever, chills, weakness or fatigue.  HEENT: Eyes: No visual loss, blurred vision, double vision or yellow sclerae.No hearing loss, sneezing, congestion, runny nose or sore throat.  SKIN: No rash or itching.  CARDIOVASCULAR:  RESPIRATORY: No shortness of breath, cough or sputum.  GASTROINTESTINAL: No anorexia, nausea, vomiting or diarrhea. No abdominal pain or blood.  GENITOURINARY: No burning on urination, no polyuria NEUROLOGICAL: No headache, dizziness, syncope, paralysis, ataxia, numbness or tingling in the extremities. No change in bowel or bladder control.  MUSCULOSKELETAL: No muscle, back pain, joint pain or stiffness.  LYMPHATICS: No enlarged nodes. No history of splenectomy.  PSYCHIATRIC: No history of depression or anxiety.  ENDOCRINOLOGIC: No reports of sweating, cold or heat intolerance. No polyuria or polydipsia.  Marland Kitchen   Physical Examination There were no vitals filed  for this visit. There were no vitals filed for this visit.  Gen: resting comfortably, no acute distress HEENT: no scleral icterus, pupils equal round and reactive, no palptable cervical adenopathy,  CV Resp: Clear to auscultation bilaterally GI: abdomen is soft, non-tender, non-distended, normal bowel sounds, no hepatosplenomegaly MSK: extremities are warm, no edema.  Skin: warm, no rash Neuro:  no focal deficits Psych: appropriate affect   Diagnostic Studies Echo limited 07/2021  1. There is moderate asymmsetric septal hypertrophy. The apex is not  completely visualized but appears to have severe apical hypertrophy with  systolic apical obliteration but no significant intracavitary gradient.  Findings consistent with apical  hypertrophic cardiomyopathy. . Left ventricular ejection fraction, by  estimation, is 65 to 70%. The left ventricle has normal function. The left  ventricle has no regional wall motion abnormalities. Left ventricular  diastolic parameters are consistent  with Grade I diastolic dysfunction (impaired relaxation).   2. Right ventricular systolic function is normal. The right ventricular  size is normal.   3. Limited study to evaluate LV function    LHC 2021 1st Mrg lesion is 100% stenosed. Ost Ramus lesion is 65% stenosed. Ost LAD to Prox LAD lesion is 15% stenosed with 60% stenosed side Tavon Magnussen in Ost 1st Diag. Ost Cx to Prox Cx lesion is 45% stenosed. Diffuse mild CAD and very tortuous vessels. No true occlusive lesions. None   SUMMARY Stable nonocclusive mild to moderate disease with no culprit lesion for ST elevation MI. ->  The true diagnosis is no longer STEMI -> very similar findings with that very similar presentation 2 years ago. EF not assessed due to plan to conserve contrast. Relatively low LVEDP-IV given 500 mL bolus.     Post cath PLAN Due to bed availability, she will be admitted to CT ICU, but could be transferred if necessary We will  consult [facility] [place] medicine service to assist with her severely elevated white blood cell count, already on antibiotics for possible UTI as well as profound nausea vomiting and hyperglycemia. ->  Concern for possible HONK (hyperosmotic nonketotic ketoacidosis   02/2022 echo  IMPRESSIONS     1. Left ventricular ejection fraction, by estimation, is 65 to 70%. The  left ventricle  has normal function. The left ventricle has no regional  wall motion abnormalities. There is moderate concentric left ventricular  hypertrophy. Left ventricular  diastolic parameters are consistent with Grade I diastolic dysfunction  (impaired relaxation). 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   2. Right ventricular systolic function is normal. The right ventricular  size is normal. Mildly increased right ventricular wall thickness. There  is normal pulmonary artery systolic pressure.   3. The mitral valve is normal in structure. No evidence of mitral valve  regurgitation. No evidence of mitral stenosis.   4. The aortic valve is tricuspid. Aortic valve regurgitation is not  visualized. Aortic valve sclerosis is present, with no evidence of aortic  valve stenosis.  Assessment and Plan        Arnoldo Lenis, M.D., F.A.C.C.

## 2022-07-17 NOTE — Telephone Encounter (Signed)
Completed.

## 2022-07-22 ENCOUNTER — Ambulatory Visit (HOSPITAL_COMMUNITY)
Admission: RE | Admit: 2022-07-22 | Discharge: 2022-07-22 | Disposition: A | Discharge status: Home / Self Care | Payer: Medicare (Managed Care) | Source: Ambulatory Visit | Attending: Hospitalist | Admitting: Hospitalist

## 2022-07-22 DIAGNOSIS — E2839 Other primary ovarian failure: Secondary | ICD-10-CM | POA: Insufficient documentation

## 2022-08-01 NOTE — Progress Notes (Signed)
Remote pacemaker transmission.   

## 2022-08-06 ENCOUNTER — Telehealth: Payer: Self-pay | Admitting: Diagnostic Neuroimaging

## 2022-08-06 ENCOUNTER — Encounter: Payer: Self-pay | Admitting: Diagnostic Neuroimaging

## 2022-08-06 NOTE — Telephone Encounter (Signed)
Unable to reach pt by phone, sent letter and mychart msg asking pt to call back to reschedule 9/19 appointment - MD out

## 2022-08-07 ENCOUNTER — Ambulatory Visit (INDEPENDENT_AMBULATORY_CARE_PROVIDER_SITE_OTHER): Payer: Medicare (Managed Care) | Admitting: Orthopedic Surgery

## 2022-08-07 ENCOUNTER — Encounter: Payer: Self-pay | Admitting: Orthopedic Surgery

## 2022-08-07 DIAGNOSIS — M5416 Radiculopathy, lumbar region: Secondary | ICD-10-CM

## 2022-08-07 NOTE — Progress Notes (Signed)
Office Visit Note   Patient: Melissa Lopez           Date of Birth: 1951/08/29           MRN: [id number] Visit Date: 08/07/2022 Requested by: [facility], [place],  [place] [id number] PCP: [facility], [place]  Subjective: Chief Complaint  Patient presents with   Right Knee - Pain   Left Knee - Pain   Lower Back - Pain    HPI: [name] is a 71 year old patient with bilateral knee pain and leg pain as well as back pain.  She had prior knee [place] in 2008 [place].  That was the right knee replacement.  Left one done in 2018.  Reports some pain with ambulation.  She does not walk much.  Currently she is in a wheelchair.  Pain does not wake her from sleep.  No prior back surgery but she did have L5 transforaminal ESI on 03/11/2022.  Over the past several months her gait has changed significantly.  She did have rather severe L4-5 foraminal stenosis and moderate spinal stenosis.  She does use a wheelchair but not in the house.  She reports decreased walking endurance.  Has new development of tremors as well.              ROS: All systems reviewed are negative as they relate to the chief complaint within the history of present illness.  Patient denies  fevers or chills.   Assessment & Plan: Visit Diagnoses:  1. Lumbar radiculopathy     Plan: Impression is well-functioning bilateral knee replacements with good range of motion and no effusion.  Patient does have some mild but present cogwheel rigidity to passive range of motion of both legs and both arms.  Has a shuffling type gait.  Ankle dorsiflexion plantarflexion quad hamstring strength is symmetric and reasonable in bilateral lower extremities.  Plan at this time is that she has well-functioning total knee replacements.  Has some spine issues but I think her bigger problem now is possible neurologic involvement such as Parkinson's.  She is seeing a  neurologist in the near future.  Follow-up with [place] as needed  Follow-Up Instructions: No follow-ups on file.   Orders:  No orders of the defined types were placed in this encounter.  No orders of the defined types were placed in this encounter.     Procedures: No procedures performed   Clinical Data: No additional findings.  Objective: Vital Signs: There were no vitals taken for this visit.  Physical Exam:   Constitutional: Patient appears well-developed HEENT:  Head: Normocephalic Eyes:EOM are normal Neck: Normal range of motion Cardiovascular: Normal rate Pulmonary/chest: Effort normal Neurologic: Patient is alert Skin: Skin is warm Psychiatric: Patient has normal mood and affect   Ortho Exam: Ortho exam demonstrates no effusion in either knee.  Extensor mechanism intact bilaterally.  No groin pain with internal/external Tatian of the leg.  No masses lymphadenopathy or skin changes noted in the knee regions.  5 out of 5 ankle dorsiflexion plantarflexion strength with 5- out of 5 hip flexion abduction adduction strength bilaterally.  She does have negative clonus bilaterally but does have some mild cogwheel type rigidity with passive range of motion of the right and left legs as well as the right left arm slightly more on the right-hand side.  Specialty Comments:  CT LUMBAR SPINE WITHOUT CONTRAST  TECHNIQUE:  Multidetector CT imaging of the lumbar spine was performed without  intravenous contrast administration. Multiplanar CT image  reconstructions were also generated.     COMPARISON:  None.     FINDINGS:  Segmentation: 5 lumbar type vertebrae.     Alignment: Normal.     Vertebrae: No acute fracture or focal pathologic process.     Paraspinal and other soft tissues: Negative.     Disc levels:     L3-4: Moderate spinal canal stenosis. Moderate right and mild left  neural foraminal stenosis.     L4-5: Small disc bulge. No spinal canal or neural  foraminal  stenosis.     L5-S1: Moderate facet hypertrophy and small disc bulge. Severe  bilateral neural foraminal stenosis.     IMPRESSION:  1. No acute fracture or static subluxation of the lumbar spine.  2. L3-4 moderate spinal canal stenosis and moderate right and mild  left neural foraminal stenosis.  3. Severe bilateral L5-S1 neural foraminal stenosis.        Electronically Signed    By: Ulyses Jarred M.D.    On: 06/13/2021 03:38  Imaging: No results found.   PMFS History: Patient Active Problem List   Diagnosis Date Noted   GERD (gastroesophageal reflux disease) 01/01/2022   Constipation    Abdominal pain    Acute renal failure superimposed on stage 3b chronic kidney disease ([place]) 08/02/2021   Hematemesis 08/01/2021   Orthostasis    Seizure-like activity ([place]) 06/13/2021   Loss of weight    Primary hypertension    Chest pain of uncertain etiology 65/02/5464   Primary hyperparathyroidism ([place]) 09/25/2020   Emesis    Hypophosphatemia    Hypokalemia 09/22/2020   Chronic diastolic CHF (congestive heart failure) ([place]) 09/22/2020   Noninfective gastroenteritis and colitis, unspecified 04/13/2020   Unilateral primary osteoarthritis, right knee 04/13/2020   Arthritis 04/13/2020   Bradycardia 04/13/2020   Cardiomyopathy, 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[place]) 04/13/2020   Class 1 obesity due to excess calories with body mass index (BMI) of 34.0 to 34.9 in adult 04/13/2020   Acute kidney failure, unspecified ([place]) 04/29/2019   Intractable nausea and vomiting 11/10/2018   Malnutrition of moderate degree 11/09/2018   AKI (acute kidney injury) ([place])    PE (pulmonary thromboembolism) ([place])    H/O cardiac pacemaker 10/31/2018   Syncope 10/31/2018   ST elevation on EKG without MI 10/31/2018   Chest pain with high risk of acute coronary syndrome 10/31/2018   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[place]) 10/31/2018   Type 2 diabetes mellitus with complication, with long-term current use  of insulin ([place]) 10/31/2018   CKD (chronic kidney disease) stage 3, GFR 30-59 ml/min (HCC) - per patient report 10/31/2018   Presence of cardiac pacemaker 10/31/2018   Essential hypertension    Hypercalcemia 07/27/2015   Past Medical History:  Diagnosis Date   CKD (chronic kidney disease) stage 3, GFR 30-59 ml/min ([place]) 10/31/2018   - per patient report   Coronary artery disease    per patient report - 2 caths with non-obstructive CAD   Essential hypertension    Frequent falls    H/O cardiac pacemaker 10/31/2018   implanted 05/2010   Sick sinus syndrome ([place])    Type 2 diabetes mellitus with complication, with long-term current use of insulin ([place]) 10/31/2018   with diabetic nephropathy    Family History  Problem Relation Age of Onset   Breast cancer Mother    Bone cancer Mother    Heart attack  Father 33   Breast cancer Sister    Other Sister        MVA   Heart disease Brother        stents   Diabetes Brother    Colon cancer Neg Hx    Colon polyps Neg Hx     Past Surgical History:  Procedure Laterality Date   BIOPSY  09/28/2020   Procedure: BIOPSY;  Surgeon: Ronald Lobo, MD;  [facility];  Service: Endoscopy;;   BIOPSY  08/03/2021   Procedure: BIOPSY;  Surgeon: Daneil Dolin, MD;  [facility];  Service: Endoscopy;;   CARDIAC PACEMAKER PLACEMENT  2008   in [place] for sick [place] syndrome   CHOLECYSTECTOMY     CORONARY/GRAFT ACUTE MI REVASCULARIZATION N/A 11/09/2020   Procedure: CORONARY/GRAFT ACUTE MI REVASCULARIZATION;  Surgeon: Leonie Man, MD;  Location: [facility];  Service: Cardiovascular;  Laterality: N/A;   ESOPHAGOGASTRODUODENOSCOPY N/A 09/28/2020   Procedure: ESOPHAGOGASTRODUODENOSCOPY (EGD);  Surgeon: Ronald Lobo, MD;  Location: [facility];  Service: Endoscopy;  Laterality: N/A;   ESOPHAGOGASTRODUODENOSCOPY (EGD) WITH PROPOFOL N/A 11/10/2018   Procedure: ESOPHAGOGASTRODUODENOSCOPY (EGD) WITH PROPOFOL;   Surgeon: Wilford Corner, MD;  Location: [facility];  Service: Endoscopy;  Laterality: N/A;   ESOPHAGOGASTRODUODENOSCOPY (EGD) WITH PROPOFOL N/A 08/03/2021   severe reflux esophagitis, bile gastritis, felt to have delayed gastric emptying. Pathology from esophageal biopsies with fibrinopurulent exudate and granulation tissue consistent with ulcer base   LEFT HEART CATH AND CORONARY ANGIOGRAPHY N/A 10/31/2018   Procedure: LEFT HEART CATH AND CORONARY ANGIOGRAPHY;  Surgeon: Leonie Man, MD;  Location: [facility];  Service: Cardiovascular;  Laterality: N/A;   PACEMAKER GENERATOR CHANGE  09/2018   in [place]   REPLACEMENT TOTAL KNEE BILATERAL     Social History   Occupational History    Comment: retired  Tobacco Use   Smoking status: Former    Years: 3.00    Types: Cigarettes    Quit date: 12/09/1969    Years since quitting: 52.6   Smokeless tobacco: Never  Vaping Use   Vaping Use: Never used  Substance and Sexual Activity   Alcohol use: Not Currently   Drug use: Not Currently    Comment: as teenager   Sexual activity: Not on file

## 2022-08-27 ENCOUNTER — Ambulatory Visit: Payer: Medicare (Managed Care) | Admitting: Diagnostic Neuroimaging

## 2022-09-23 ENCOUNTER — Encounter: Payer: Self-pay | Admitting: Diagnostic Neuroimaging

## 2022-09-23 ENCOUNTER — Telehealth: Payer: Self-pay | Admitting: Diagnostic Neuroimaging

## 2022-09-23 ENCOUNTER — Ambulatory Visit (INDEPENDENT_AMBULATORY_CARE_PROVIDER_SITE_OTHER): Payer: Medicare (Managed Care) | Admitting: Diagnostic Neuroimaging

## 2022-09-23 VITALS — BP 160/98 | HR 79 | Ht 64.0 in

## 2022-09-23 DIAGNOSIS — G20C Parkinsonism, unspecified: Secondary | ICD-10-CM

## 2022-09-23 MED ORDER — CARBIDOPA-LEVODOPA 25-100 MG PO TABS: 1.0000 | ORAL_TABLET | Freq: Three times a day (TID) | ORAL | 6 refills | Status: DC

## 2022-09-23 NOTE — Patient Instructions (Signed)
-   check DATscan (eval for parkinsonism vs other causes of gait / imbalance / bradykinesia)  - needs supervision for transfer and gait; use cane / walker  - start carbidopa / levodopa (25/100) half tab three times a day with meals x 1-2 weeks; then 1 tab three times a day with meals

## 2022-09-23 NOTE — Progress Notes (Signed)
[facility]  PATIENT: Melissa Lopez DOB: 09/18/1951  REFERRING CLINICIAN: [facility], [place]: patient and daughter REASON FOR VISIT: follow up   [place]:  Chief Complaint  Patient presents with   Gait Problem    Rm 6 dgtr- [name]  "falls and tremor are better since nutrition improved; I want her checked for Parkinson's, shuffling, feet freeze, hands shake while drinking, blanking/zoning out; gets worse at night"    Tremors  09/23/22  HISTORY OF PRESENT ILLNESS:   UPDATE (09/23/22, VRP): Since last visit, has had progressive gait and balance decline, in spite of improving p.o. intake and gaining weight.  Patient feels like her feet get stuck to the ground.  Having some tremors.  Daughter is concerned about possibility of Parkinson's disease.  UPDATE (11/27/21, VRP): Since last visit, doing poorly. Admitted in July 2022 for convulsive syncope, orthostatic hypotension, hypercalcemia, AKI, dehydration. Then admitted Aug 2022 for intractable N/V and hematemesis. Now calcium and renal fx are improving. No more shaking spells.   PRIOR HPI (04/16/21):  71 year old female here for evaluation of frequent falls.  History of coronary disease, sick sinus syndrome, pacemaker, diabetes, hypertension, gastroparesis, chronic nausea and vomiting, chronic THC use, chronic pain.  November 2019 patient was living in [place] with daughter and moved to [place] to be closer to family.  Since that time she has had being more falls.  In December 2019 she had a heart attack.  Symptoms continue to worsen since that time.  Now she is having falls every 2 to 3 months, sometimes several times a week.  She also has had 60 to 80 pound weight loss over the past few years.  She has had continued physical decline.  Recently she fell in the middle the night when she was using her bedside commode, striking her left forehead and eye on the bed rail.       REVIEW OF SYSTEMS: Full 14 system review of systems performed and negative with exception of: As per HPI.  ALLERGIES: Allergies  Allergen Reactions   Contrast Media [Iodinated Contrast Media] Swelling    Contrast Dye - the one made out of shellfish, gives a really bad reaction - swelling   Oxycodone-Acetaminophen Anaphylaxis   Xarelto  [Rivaroxaban] Swelling   Oxycodone Other (See Comments)    Statused by Person:  EAST, CAMELIA(CE) on [phone]    HOME MEDICATIONS: Outpatient Medications Prior to Visit  Medication Sig Dispense Refill   acetaminophen (TYLENOL) 325 MG tablet Take 650 mg by mouth in the morning, at noon, and at bedtime.     apixaban (ELIQUIS) 2.5 MG TABS tablet Take 2.5 mg by mouth 2 (two) times daily.     aspirin 81 MG chewable tablet Chew 1 tablet (81 mg total) by mouth daily. 30 tablet 0   carvedilol (COREG) 6.25 MG tablet Take 6.25 mg by mouth 2 (two) times daily with a meal.     Cholecalciferol (D3-1000) 25 MCG (1000 UT) capsule Take 1,000 Units by mouth daily.     cinacalcet (SENSIPAR) 30 MG tablet Take 1 tablet (30 mg total) by mouth 2 (two) times daily with a meal. (Patient taking differently: Take 45 mg by mouth in the morning.) 60 tablet 1   cyclobenzaprine (FLEXERIL) 5 MG tablet Take 5 mg by mouth at bedtime.     FLUoxetine (PROZAC) 20 MG tablet Take 40 mg by mouth at bedtime.     gabapentin (NEURONTIN) 400 MG capsule  Take 400 mg by mouth at bedtime.     hydrALAZINE (APRESOLINE) 50 MG tablet Take 50 mg by mouth 3 (three) times daily.     HYDROcodone-acetaminophen (NORCO/VICODIN) 5-325 MG tablet Take 1 tablet by mouth every 6 (six) hours as needed for moderate pain. (Patient taking differently: Take 1 tablet by mouth 2 (two) times daily.) 15 tablet 0   lidocaine (LIDODERM) 5 % Place 2 patches onto the skin daily as needed (pain (hip & back)). (Apply hip and back )Remove & Discard patch within 12 hours or as directed by MD     linaclotide (LINZESS) 290  MCG CAPS capsule Take 1 capsule (290 mcg total) by mouth daily before breakfast. 30 capsule 1   Magnesium 400 MG CAPS Take 400 mg by mouth daily.     Menthol, Topical Analgesic, (BIOFREEZE EX) Apply 1 application topically 3 (three) times daily as needed (pain.).     metoCLOPramide (REGLAN) 10 MG tablet Take 10 mg by mouth in the morning and at bedtime.     mirtazapine (REMERON) 7.5 MG tablet Take 7.5 mg by mouth at bedtime.     MULTIPLE VITAMIN-FOLIC ACID PO Take 1 tablet by mouth in the morning.     Nutritional Supplements (,FEEDING SUPPLEMENT, PROSOURCE PLUS) liquid Take 30 mLs by mouth daily. 887 mL 0   pantoprazole (PROTONIX) 40 MG tablet Take 1 tablet (40 mg total) by mouth 2 (two) times daily before a meal. 60 tablet 1   polyethylene glycol (MIRALAX / GLYCOLAX) 17 g packet Take 17 g by mouth 2 (two) times daily. (Patient taking differently: Take 17 g by mouth daily as needed (constipation.).) 14 each 0   rosuvastatin (CRESTOR) 20 MG tablet Take 20 mg by mouth in the morning.     senna-docusate (SENOKOT-S) 8.6-50 MG tablet Take 1 tablet by mouth 2 (two) times daily.     Skin Protectants, Misc. (MINERIN CREME EX) Apply topically daily.     traZODone (DESYREL) 50 MG tablet Take 50 mg by mouth at bedtime.     Vibegron (GEMTESA) 75 MG TABS Take 75 mg by mouth in the morning.     Vitamin D, Ergocalciferol, (DRISDOL) 1.25 MG (50000 UNIT) CAPS capsule Take 50,000 Units by mouth every Sunday.     celecoxib (CELEBREX) 50 MG capsule Take 50 mg by mouth 2 (two) times daily. (Patient not taking: Reported on 09/23/2022)     furosemide (LASIX) 40 MG tablet Take 80 mg by mouth in the morning. (Patient not taking: Reported on 09/23/2022)     polyethylene glycol-electrolytes (TRILYTE) 420 g solution Take 4,000 mLs by mouth as directed. (Patient not taking: Reported on 09/23/2022) 4000 mL 0   carvedilol (COREG) 3.125 MG tablet Take 1 tablet (3.125 mg total) by mouth 2 (two) times daily. 180 tablet 3    gabapentin (NEURONTIN) 300 MG capsule Take 300 mg by mouth at bedtime.     metoCLOPramide (REGLAN) 5 MG tablet Take 1 tablet (5 mg total) by mouth 3 (three) times daily before meals. 90 tablet 1   No facility-administered medications prior to visit.    PAST MEDICAL HISTORY: Past Medical History:  Diagnosis Date   CKD (chronic kidney disease) stage 3, GFR 30-59 ml/min ([place]) 10/31/2018   - per patient report   Coronary artery disease    per patient report - 2 caths with non-obstructive CAD   Essential hypertension    Frequent falls    H/O cardiac pacemaker 10/31/2018   implanted 05/2010  Sick sinus syndrome ([place])    Type 2 diabetes mellitus with complication, with long-term current use of insulin ([place]) 10/31/2018   with diabetic nephropathy    PAST SURGICAL HISTORY: Past Surgical History:  Procedure Laterality Date   BIOPSY  09/28/2020   Procedure: BIOPSY;  Surgeon: Ronald Lobo, MD;  Location: [facility];  Service: Endoscopy;;   BIOPSY  08/03/2021   Procedure: BIOPSY;  Surgeon: Daneil Dolin, MD;  [facility]: [facility];  Service: Endoscopy;;   CARDIAC PACEMAKER PLACEMENT  2008   in [place] for sick sinus syndrome   CHOLECYSTECTOMY     CORONARY/GRAFT ACUTE MI REVASCULARIZATION N/A 11/09/2020   Procedure: CORONARY/GRAFT ACUTE MI REVASCULARIZATION;  Surgeon: Leonie Man, MD;  Location: [facility];  Service: Cardiovascular;  Laterality: N/A;   ESOPHAGOGASTRODUODENOSCOPY N/A 09/28/2020   Procedure: ESOPHAGOGASTRODUODENOSCOPY (EGD);  Surgeon: Ronald Lobo, MD;  Location: [facility];  Service: Endoscopy;  Laterality: N/A;   ESOPHAGOGASTRODUODENOSCOPY (EGD) WITH PROPOFOL N/A 11/10/2018   Procedure: ESOPHAGOGASTRODUODENOSCOPY (EGD) WITH PROPOFOL;  Surgeon: Wilford Corner, MD;  Location: [facility];  Service: Endoscopy;  Laterality: N/A;   ESOPHAGOGASTRODUODENOSCOPY (EGD) WITH PROPOFOL N/A 08/03/2021   severe reflux esophagitis, bile gastritis,  felt to have delayed gastric emptying. Pathology from esophageal biopsies with fibrinopurulent exudate and granulation tissue consistent with ulcer base   LEFT HEART CATH AND CORONARY ANGIOGRAPHY N/A 10/31/2018   Procedure: LEFT HEART CATH AND CORONARY ANGIOGRAPHY;  Surgeon: Leonie Man, MD;  Location: [facility];  Service: Cardiovascular;  Laterality: N/A;   PACEMAKER GENERATOR CHANGE  09/2018   in [place]   REPLACEMENT TOTAL KNEE BILATERAL      FAMILY HISTORY: Family History  Problem Relation Age of Onset   Breast cancer Mother    Bone cancer Mother    Heart attack Father 55   Breast cancer Sister    Other Sister        MVA   Heart disease Brother        stents   Diabetes Brother    Colon cancer Neg Hx    Colon polyps Neg Hx     SOCIAL HISTORY: Social History   Socioeconomic History   Marital status: Single    Spouse name: Not on file   Number of children: 4   Years of education: Not on file   Highest education level: Some college, no degree  Occupational History    Comment: retired  Tobacco Use   Smoking status: Former    Years: 3.00    Types: Cigarettes    Quit date: 12/09/1969    Years since quitting: 52.8   Smokeless tobacco: Never  Vaping Use   Vaping Use: Never used  Substance and Sexual Activity   Alcohol use: Not Currently   Drug use: Not Currently    Comment: as teenager   Sexual activity: Not on file  Other Topics Concern   Not on file  Social History Narrative   04/16/21 Moved from [place] and lives with daughter, [name] in [place].   Now in [employer]   Social Determinants of Health   Financial Resource Strain: Not on file  Food Insecurity: Not on file  Transportation Needs: Not on file  Physical Activity: Not on file  Stress: Not on file  Social Connections: Not on file  Intimate Partner Violence: Not on file     PHYSICAL EXAM  GENERAL EXAM/CONSTITUTIONAL: Vitals:  Vitals:   09/23/22 1433  BP: (!) 160/98  Pulse: 79   Height: '5\' 4"'$  (1.626 m)   Body mass index is 24.1 kg/m. Wt Readings from Last 3 Encounters:  01/01/22 140 lb 6.4 oz (63.7 kg)  12/31/21 140 lb (63.5 kg)  11/27/21 138 lb (62.6 kg)   Patient is in no distress; well developed, nourished and groomed; neck is supple  CARDIOVASCULAR: Examination of carotid arteries is normal; no carotid bruits Regular rate and rhythm, no murmurs Examination of peripheral vascular system by observation and palpation is normal  EYES: Ophthalmoscopic exam of optic discs and posterior segments is normal; no papilledema or hemorrhages No results found.  MUSCULOSKELETAL: Gait, strength, tone, movements noted in Neurologic exam below  NEUROLOGIC: MENTAL STATUS:      No data to display         awake, alert, oriented to person, place and time recent and remote memory intact normal attention and concentration language fluent, comprehension intact, naming intact fund of knowledge appropriate  CRANIAL NERVE:  2nd - no papilledema on fundoscopic exam 2nd, 3rd, 4th, 6th - pupils equal and reactive to light, visual fields full to confrontation, extraocular muscles intact, no nystagmus 5th - facial sensation symmetric 7th - facial strength --> decr right mouth 8th - hearing intact 9th - palate elevates symmetrically, uvula midline 11th - shoulder shrug symmetric 12th - tongue protrusion midline MASKED FACIES  MOTOR:  INCREASED TONE IN BUE AND BLE MODERATE BRADYKINESIA IN BUE AND BLE; MILD RESTING TREMOR BUE 4 BLE 3 PROX, 4 DISTAL  SENSORY:  normal and symmetric to light touch  COORDINATION:  finger-nose-finger, fine finger movements SLOW  REFLEXES:  deep tendon reflexes 1+ and symmetric  GAIT/STATION:  IN WHEEL CHAIR; BARELY ABLE TO STAND; VERY UNSTEADY     DIAGNOSTIC DATA (LABS, IMAGING, TESTING) - I reviewed patient records, labs, notes, testing and imaging myself where available.  Lab Results  Component Value Date   WBC 10.2  08/07/2021   HGB 12.0 08/07/2021   HCT 35.5 (L) 08/07/2021   MCV 95.2 08/07/2021   PLT 175 08/07/2021      Component Value Date/Time   NA 135 08/07/2021 0558   K 3.9 08/07/2021 0558   CL 108 08/07/2021 0558   CO2 22 08/07/2021 0558   GLUCOSE 158 (H) 08/07/2021 0558   BUN 17 08/07/2021 0558   CREATININE 1.37 (H) 08/07/2021 0558   CALCIUM 10.1 08/07/2021 0558   CALCIUM 13.5 (HH) 09/22/2020 2032   PROT 5.5 (L) 08/07/2021 0558   ALBUMIN 2.7 (L) 08/07/2021 0558   AST 17 08/07/2021 0558   ALT 29 08/07/2021 0558   ALKPHOS 67 08/07/2021 0558   BILITOT 0.3 08/07/2021 0558   GFRNONAA 42 (L) 08/07/2021 0558   GFRAA 34 (L) 11/12/2018 0342   Lab Results  Component Value Date   CHOL 153 11/01/2018   HDL 24 (L) 11/01/2018   LDLCALC 94 11/01/2018   TRIG 175 (H) 11/01/2018   CHOLHDL 6.4 11/01/2018   Lab Results  Component Value Date   HGBA1C 5.5 08/02/2021   No results found for: "VITAMINB12" Lab Results  Component Value Date   TSH 1.622 06/13/2021    04/11/21 CT head [I reviewed images myself and agree with interpretation. -VRP]  1. No acute intracranial abnormalities. 2. Moderate chronic microvascular ischemic change. Old right thalamic lacunar infarct. 3. Significant left periorbital hemorrhage/swelling. No injury to the left globe or postseptal orbit. No fractures.   06/14/21 MRI cervical 1. Abbreviated, sagittal only examination. 2. No evidence of cord compression or definite  cord signal abnormality in the cervical or thoracic spine. 3. Cervical and thoracic disc degeneration most notable at C5-6 where there is mild spinal stenosis and asymmetrically severe left neural foraminal stenosis. 4. No thoracic spinal stenosis  06/14/21 MRI thoracic spine  1. Abbreviated, sagittal only examination. 2. No evidence of cord compression or definite cord signal abnormality in the cervical or thoracic spine. 3. Cervical and thoracic disc degeneration most notable at C5-6 where there is  mild spinal stenosis and asymmetrically severe left neural foraminal stenosis. 4. No thoracic spinal stenosis.   ASSESSMENT AND PLAN  71 y.o. year old female here with:  Dx:  1. Parkinsonism, unspecified Parkinsonism type     PLAN:  PARKINSONISM --> GENERAL BALANCE AND GAIT DIFFICULTY (progressive worsening gait, balance, tremor, stiffness, bradykinesia; also with gastroparesis, urinary incontinence; exacerbated by weight loss, muscle atrophy, deconditioning, poor PO intake, diabetic neuropathy, chronic pain issues) - check DATscan (eval for parkinsonism vs other causes of gait / imbalance / bradykinesia) - needs supervision for transfer and gait; use cane / walker - start carbidopa / levodopa (25/100) half tab three times a day with meals x 1-2 weeks; then 1 tab three times a day with meals - continue PT exercises  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    Dispense:  90 tablet    Refill:  6   carbidopa-levodopa (SINEMET IR) 25-100 MG tablet    Sig: Take 1 tablet by mouth 3 (three) times daily before meals.    Dispense:  90 tablet    Refill:  6   Orders Placed This Encounter  Procedures   NM BRAIN DATSCAN TUMOR LOC INFLAM SPECT 1 DAY   Return in about 6 months (around 03/25/2023).  I spent 45 minutes of face-to-face and non-face-to-face time with patient.  This included previsit chart review, lab review, study review, order entry, electronic health record documentation, patient education.        Penni Bombard, MD 51/01/5851, 7:78 PM Certified in Neurology, Neurophysiology and Neuroimaging  [facility] [address], [place], [place]  [id number] [phone]

## 2022-09-23 NOTE — Telephone Encounter (Signed)
sent order to [facility] nuclear medicine [phone]

## 2022-09-24 ENCOUNTER — Other Ambulatory Visit: Payer: Self-pay | Admitting: Family Medicine

## 2022-09-24 ENCOUNTER — Ambulatory Visit
Admission: RE | Admit: 2022-09-24 | Discharge: 2022-09-24 | Disposition: A | Discharge status: Home / Self Care | Payer: Medicare (Managed Care) | Source: Ambulatory Visit | Attending: Family Medicine | Admitting: Family Medicine

## 2022-09-24 DIAGNOSIS — R0989 Other specified symptoms and signs involving the circulatory and respiratory systems: Secondary | ICD-10-CM

## 2022-10-03 ENCOUNTER — Telehealth: Payer: Self-pay | Admitting: Diagnostic Neuroimaging

## 2022-10-03 NOTE — Telephone Encounter (Signed)
Pt's daughter, Ayahna Solazzo (on [place]) want to know if the benefit of doing the test is worth it. Because she is allergic to contrast. Would like a call from the nurse.

## 2022-10-03 NOTE — Telephone Encounter (Signed)
PHONE RM CAN RELAY   Contacted pt daughter back, LVM per DPR, informing her the type of  injection used for a DATscan is different than MRI contrast. It is listed in her allergy list that she is allergic to contact so Dr Leta Baptist and the facility she is getting scan done at is aware of this.  Also, he order the scan for eval for parkinsonism vs other causes of gait / imbalance / bradykinesia.  Number provided to CB with questions

## 2022-10-10 ENCOUNTER — Ambulatory Visit (INDEPENDENT_AMBULATORY_CARE_PROVIDER_SITE_OTHER): Payer: Medicare (Managed Care)

## 2022-10-10 DIAGNOSIS — I495 Sick sinus syndrome: Secondary | ICD-10-CM | POA: Diagnosis not present

## 2022-10-10 LAB — CUP PACEART REMOTE DEVICE CHECK
Battery Remaining Longevity: 78 mo
Battery Remaining Percentage: 62 %
Battery Voltage: 2.99 V
Brady Statistic AP VP Percent: 13 %
Brady Statistic AP VS Percent: 52 %
Brady Statistic AS VP Percent: 4.4 %
Brady Statistic AS VS Percent: 29 %
Brady Statistic RA Percent Paced: 62 %
Brady Statistic RV Percent Paced: 17 %
Date Time Interrogation Session: 20231102020016
Implantable Lead Connection Status: 753985
Implantable Lead Connection Status: 753985
Implantable Lead Implant Date: 20110113
Implantable Lead Implant Date: 20110113
Implantable Lead Location: 753859
Implantable Lead Location: 753860
Implantable Pulse Generator Implant Date: 20191008
Lead Channel Impedance Value: 430 Ohm
Lead Channel Impedance Value: 630 Ohm
Lead Channel Pacing Threshold Amplitude: 0.75 V
Lead Channel Pacing Threshold Amplitude: 0.875 V
Lead Channel Pacing Threshold Pulse Width: 0.4 ms
Lead Channel Pacing Threshold Pulse Width: 0.4 ms
Lead Channel Sensing Intrinsic Amplitude: 12 mV
Lead Channel Sensing Intrinsic Amplitude: 4.3 mV
Lead Channel Setting Pacing Amplitude: 1.125
Lead Channel Setting Pacing Amplitude: 1.75 V
Lead Channel Setting Pacing Pulse Width: 0.4 ms
Lead Channel Setting Sensing Sensitivity: 2 mV
Pulse Gen Model: 2272
Pulse Gen Serial Number: 9064940

## 2022-10-17 ENCOUNTER — Encounter (HOSPITAL_COMMUNITY): Payer: Medicare (Managed Care)

## 2022-10-17 ENCOUNTER — Encounter (HOSPITAL_COMMUNITY): Admission: RE | Admit: 2022-10-17 | Payer: Medicare (Managed Care) | Source: Ambulatory Visit

## 2022-10-21 NOTE — Progress Notes (Signed)
Remote pacemaker transmission.   

## 2023-01-03 ENCOUNTER — Ambulatory Visit: Payer: Medicare (Managed Care) | Admitting: Cardiology

## 2023-01-09 ENCOUNTER — Ambulatory Visit (INDEPENDENT_AMBULATORY_CARE_PROVIDER_SITE_OTHER): Payer: Medicare (Managed Care)

## 2023-01-09 DIAGNOSIS — I495 Sick sinus syndrome: Secondary | ICD-10-CM | POA: Diagnosis not present

## 2023-01-09 LAB — CUP PACEART REMOTE DEVICE CHECK
Battery Remaining Longevity: 74 mo
Battery Remaining Percentage: 59 %
Battery Voltage: 2.99 V
Brady Statistic AP VP Percent: 12 %
Brady Statistic AP VS Percent: 51 %
Brady Statistic AS VP Percent: 4.2 %
Brady Statistic AS VS Percent: 32 %
Brady Statistic RA Percent Paced: 60 %
Brady Statistic RV Percent Paced: 16 %
Date Time Interrogation Session: 20240201020012
Implantable Lead Connection Status: 753985
Implantable Lead Connection Status: 753985
Implantable Lead Implant Date: 20110113
Implantable Lead Implant Date: 20110113
Implantable Lead Location: 753859
Implantable Lead Location: 753860
Implantable Pulse Generator Implant Date: 20191008
Lead Channel Impedance Value: 440 Ohm
Lead Channel Impedance Value: 630 Ohm
Lead Channel Pacing Threshold Amplitude: 0.75 V
Lead Channel Pacing Threshold Amplitude: 0.75 V
Lead Channel Pacing Threshold Pulse Width: 0.4 ms
Lead Channel Pacing Threshold Pulse Width: 0.4 ms
Lead Channel Sensing Intrinsic Amplitude: 12 mV
Lead Channel Sensing Intrinsic Amplitude: 4.3 mV
Lead Channel Setting Pacing Amplitude: 1 V
Lead Channel Setting Pacing Amplitude: 1.75 V
Lead Channel Setting Pacing Pulse Width: 0.4 ms
Lead Channel Setting Sensing Sensitivity: 2 mV
Pulse Gen Model: 2272
Pulse Gen Serial Number: 9064940

## 2023-01-29 NOTE — Progress Notes (Signed)
Remote pacemaker transmission.   

## 2023-03-22 ENCOUNTER — Emergency Department (HOSPITAL_COMMUNITY): Payer: Medicare (Managed Care)

## 2023-03-22 ENCOUNTER — Inpatient Hospital Stay (HOSPITAL_COMMUNITY)
Admission: EM | Admit: 2023-03-22 | Discharge: 2023-04-01 | DRG: 682 | Disposition: A | Discharge status: Skilled Nursing Facility | Payer: Medicare (Managed Care) | Attending: Internal Medicine | Admitting: Internal Medicine

## 2023-03-22 ENCOUNTER — Other Ambulatory Visit: Payer: Self-pay

## 2023-03-22 ENCOUNTER — Encounter (HOSPITAL_COMMUNITY): Payer: Self-pay | Admitting: Emergency Medicine

## 2023-03-22 DIAGNOSIS — Z794 Long term (current) use of insulin: Secondary | ICD-10-CM

## 2023-03-22 DIAGNOSIS — N2581 Secondary hyperparathyroidism of renal origin: Secondary | ICD-10-CM | POA: Diagnosis present

## 2023-03-22 DIAGNOSIS — G936 Cerebral edema: Secondary | ICD-10-CM | POA: Diagnosis not present

## 2023-03-22 DIAGNOSIS — R54 Age-related physical debility: Secondary | ICD-10-CM | POA: Diagnosis present

## 2023-03-22 DIAGNOSIS — E872 Acidosis, unspecified: Secondary | ICD-10-CM | POA: Diagnosis not present

## 2023-03-22 DIAGNOSIS — I422 Other hypertrophic cardiomyopathy: Secondary | ICD-10-CM | POA: Diagnosis present

## 2023-03-22 DIAGNOSIS — I639 Cerebral infarction, unspecified: Secondary | ICD-10-CM | POA: Diagnosis not present

## 2023-03-22 DIAGNOSIS — Z885 Allergy status to narcotic agent status: Secondary | ICD-10-CM

## 2023-03-22 DIAGNOSIS — Z950 Presence of cardiac pacemaker: Secondary | ICD-10-CM

## 2023-03-22 DIAGNOSIS — I129 Hypertensive chronic kidney disease with stage 1 through stage 4 chronic kidney disease, or unspecified chronic kidney disease: Secondary | ICD-10-CM | POA: Diagnosis present

## 2023-03-22 DIAGNOSIS — Z96653 Presence of artificial knee joint, bilateral: Secondary | ICD-10-CM | POA: Diagnosis present

## 2023-03-22 DIAGNOSIS — E1142 Type 2 diabetes mellitus with diabetic polyneuropathy: Secondary | ICD-10-CM | POA: Diagnosis present

## 2023-03-22 DIAGNOSIS — D62 Acute posthemorrhagic anemia: Secondary | ICD-10-CM | POA: Diagnosis present

## 2023-03-22 DIAGNOSIS — D6832 Hemorrhagic disorder due to extrinsic circulating anticoagulants: Secondary | ICD-10-CM | POA: Diagnosis present

## 2023-03-22 DIAGNOSIS — I130 Hypertensive heart and chronic kidney disease with heart failure and stage 1 through stage 4 chronic kidney disease, or unspecified chronic kidney disease: Secondary | ICD-10-CM | POA: Diagnosis present

## 2023-03-22 DIAGNOSIS — S37012A Minor contusion of left kidney, initial encounter: Secondary | ICD-10-CM | POA: Diagnosis present

## 2023-03-22 DIAGNOSIS — Z86711 Personal history of pulmonary embolism: Secondary | ICD-10-CM

## 2023-03-22 DIAGNOSIS — I4891 Unspecified atrial fibrillation: Secondary | ICD-10-CM | POA: Diagnosis present

## 2023-03-22 DIAGNOSIS — W19XXXA Unspecified fall, initial encounter: Secondary | ICD-10-CM | POA: Diagnosis present

## 2023-03-22 DIAGNOSIS — I161 Hypertensive emergency: Secondary | ICD-10-CM | POA: Diagnosis not present

## 2023-03-22 DIAGNOSIS — I5032 Chronic diastolic (congestive) heart failure: Secondary | ICD-10-CM | POA: Diagnosis present

## 2023-03-22 DIAGNOSIS — N179 Acute kidney failure, unspecified: Principal | ICD-10-CM | POA: Diagnosis present

## 2023-03-22 DIAGNOSIS — R310 Gross hematuria: Secondary | ICD-10-CM | POA: Diagnosis present

## 2023-03-22 DIAGNOSIS — E1122 Type 2 diabetes mellitus with diabetic chronic kidney disease: Secondary | ICD-10-CM | POA: Diagnosis present

## 2023-03-22 DIAGNOSIS — G9341 Metabolic encephalopathy: Secondary | ICD-10-CM | POA: Diagnosis not present

## 2023-03-22 DIAGNOSIS — Z87891 Personal history of nicotine dependence: Secondary | ICD-10-CM

## 2023-03-22 DIAGNOSIS — I612 Nontraumatic intracerebral hemorrhage in hemisphere, unspecified: Secondary | ICD-10-CM | POA: Diagnosis not present

## 2023-03-22 DIAGNOSIS — N136 Pyonephrosis: Secondary | ICD-10-CM | POA: Diagnosis present

## 2023-03-22 DIAGNOSIS — I10 Essential (primary) hypertension: Secondary | ICD-10-CM | POA: Diagnosis present

## 2023-03-22 DIAGNOSIS — Q613 Polycystic kidney, unspecified: Secondary | ICD-10-CM

## 2023-03-22 DIAGNOSIS — N1832 Chronic kidney disease, stage 3b: Secondary | ICD-10-CM | POA: Diagnosis present

## 2023-03-22 DIAGNOSIS — I6381 Other cerebral infarction due to occlusion or stenosis of small artery: Secondary | ICD-10-CM

## 2023-03-22 DIAGNOSIS — N1330 Unspecified hydronephrosis: Secondary | ICD-10-CM | POA: Insufficient documentation

## 2023-03-22 DIAGNOSIS — E0821 Diabetes mellitus due to underlying condition with diabetic nephropathy: Secondary | ICD-10-CM | POA: Diagnosis not present

## 2023-03-22 DIAGNOSIS — Z833 Family history of diabetes mellitus: Secondary | ICD-10-CM

## 2023-03-22 DIAGNOSIS — N178 Other acute kidney failure: Secondary | ICD-10-CM

## 2023-03-22 DIAGNOSIS — Z803 Family history of malignant neoplasm of breast: Secondary | ICD-10-CM

## 2023-03-22 DIAGNOSIS — F32A Depression, unspecified: Secondary | ICD-10-CM | POA: Diagnosis present

## 2023-03-22 DIAGNOSIS — I610 Nontraumatic intracerebral hemorrhage in hemisphere, subcortical: Secondary | ICD-10-CM | POA: Diagnosis not present

## 2023-03-22 DIAGNOSIS — I495 Sick sinus syndrome: Secondary | ICD-10-CM | POA: Diagnosis present

## 2023-03-22 DIAGNOSIS — I629 Nontraumatic intracranial hemorrhage, unspecified: Secondary | ICD-10-CM | POA: Diagnosis not present

## 2023-03-22 DIAGNOSIS — G20A1 Parkinson's disease without dyskinesia, without mention of fluctuations: Secondary | ICD-10-CM | POA: Diagnosis present

## 2023-03-22 DIAGNOSIS — Z7982 Long term (current) use of aspirin: Secondary | ICD-10-CM

## 2023-03-22 DIAGNOSIS — E871 Hypo-osmolality and hyponatremia: Secondary | ICD-10-CM | POA: Diagnosis not present

## 2023-03-22 DIAGNOSIS — Z8673 Personal history of transient ischemic attack (TIA), and cerebral infarction without residual deficits: Secondary | ICD-10-CM

## 2023-03-22 DIAGNOSIS — R319 Hematuria, unspecified: Secondary | ICD-10-CM | POA: Diagnosis present

## 2023-03-22 DIAGNOSIS — M11232 Other chondrocalcinosis, left wrist: Secondary | ICD-10-CM | POA: Diagnosis present

## 2023-03-22 DIAGNOSIS — E1121 Type 2 diabetes mellitus with diabetic nephropathy: Secondary | ICD-10-CM | POA: Diagnosis present

## 2023-03-22 DIAGNOSIS — Z7901 Long term (current) use of anticoagulants: Secondary | ICD-10-CM

## 2023-03-22 DIAGNOSIS — K920 Hematemesis: Secondary | ICD-10-CM | POA: Diagnosis present

## 2023-03-22 DIAGNOSIS — I619 Nontraumatic intracerebral hemorrhage, unspecified: Secondary | ICD-10-CM | POA: Diagnosis not present

## 2023-03-22 DIAGNOSIS — G8194 Hemiplegia, unspecified affecting left nondominant side: Secondary | ICD-10-CM | POA: Diagnosis not present

## 2023-03-22 DIAGNOSIS — E876 Hypokalemia: Secondary | ICD-10-CM | POA: Diagnosis not present

## 2023-03-22 DIAGNOSIS — Z8249 Family history of ischemic heart disease and other diseases of the circulatory system: Secondary | ICD-10-CM

## 2023-03-22 DIAGNOSIS — N185 Chronic kidney disease, stage 5: Secondary | ICD-10-CM | POA: Diagnosis not present

## 2023-03-22 DIAGNOSIS — R29706 NIHSS score 6: Secondary | ICD-10-CM | POA: Diagnosis not present

## 2023-03-22 DIAGNOSIS — I618 Other nontraumatic intracerebral hemorrhage: Secondary | ICD-10-CM | POA: Diagnosis not present

## 2023-03-22 DIAGNOSIS — R296 Repeated falls: Secondary | ICD-10-CM | POA: Diagnosis present

## 2023-03-22 DIAGNOSIS — E1143 Type 2 diabetes mellitus with diabetic autonomic (poly)neuropathy: Secondary | ICD-10-CM | POA: Diagnosis present

## 2023-03-22 DIAGNOSIS — K3184 Gastroparesis: Secondary | ICD-10-CM | POA: Diagnosis present

## 2023-03-22 DIAGNOSIS — Z91041 Radiographic dye allergy status: Secondary | ICD-10-CM

## 2023-03-22 DIAGNOSIS — G20B1 Parkinson's disease with dyskinesia, without mention of fluctuations: Secondary | ICD-10-CM | POA: Diagnosis not present

## 2023-03-22 DIAGNOSIS — Z808 Family history of malignant neoplasm of other organs or systems: Secondary | ICD-10-CM

## 2023-03-22 DIAGNOSIS — Z79899 Other long term (current) drug therapy: Secondary | ICD-10-CM

## 2023-03-22 DIAGNOSIS — E785 Hyperlipidemia, unspecified: Secondary | ICD-10-CM | POA: Diagnosis present

## 2023-03-22 DIAGNOSIS — I2510 Atherosclerotic heart disease of native coronary artery without angina pectoris: Secondary | ICD-10-CM | POA: Diagnosis present

## 2023-03-22 DIAGNOSIS — Z888 Allergy status to other drugs, medicaments and biological substances status: Secondary | ICD-10-CM

## 2023-03-22 DIAGNOSIS — T45515A Adverse effect of anticoagulants, initial encounter: Secondary | ICD-10-CM | POA: Diagnosis present

## 2023-03-22 LAB — BASIC METABOLIC PANEL
Anion gap: 14 (ref 5–15)
BUN: 31 mg/dL — ABNORMAL HIGH (ref 8–23)
CO2: 25 mmol/L (ref 22–32)
Calcium: 9.4 mg/dL (ref 8.9–10.3)
Chloride: 100 mmol/L (ref 98–111)
Creatinine, Ser: 3.04 mg/dL — ABNORMAL HIGH (ref 0.44–1.00)
GFR, Estimated: 16 mL/min — ABNORMAL LOW (ref >60)
Glucose, Bld: 168 mg/dL — ABNORMAL HIGH (ref 70–99)
Potassium: 3.8 mmol/L (ref 3.5–5.1)
Sodium: 139 mmol/L (ref 135–145)

## 2023-03-22 LAB — URINALYSIS, ROUTINE W REFLEX MICROSCOPIC
Bilirubin Urine: NEGATIVE
Glucose, UA: NEGATIVE mg/dL
Ketones, ur: NEGATIVE mg/dL
Nitrite: NEGATIVE
Protein, ur: 100 mg/dL — AB
RBC / HPF: >50 RBC/hpf (ref 0–5)
Specific Gravity, Urine: 1.009 (ref 1.005–1.030)
pH: 6 (ref 5.0–8.0)

## 2023-03-22 LAB — CK: Total CK: 33 U/L — ABNORMAL LOW (ref 38–234)

## 2023-03-22 LAB — CBC
HCT: 35.9 % — ABNORMAL LOW (ref 36.0–46.0)
Hemoglobin: 12 g/dL (ref 12.0–15.0)
MCH: 30.4 pg (ref 26.0–34.0)
MCHC: 33.4 g/dL (ref 30.0–36.0)
MCV: 90.9 fL (ref 80.0–100.0)
Platelets: 261 10*3/uL (ref 150–400)
RBC: 3.95 MIL/uL (ref 3.87–5.11)
RDW: 13 % (ref 11.5–15.5)
WBC: 17.6 10*3/uL — ABNORMAL HIGH (ref 4.0–10.5)
nRBC: 0 % (ref 0.0–0.2)

## 2023-03-22 LAB — GLUCOSE, CAPILLARY: Glucose-Capillary: 222 mg/dL — ABNORMAL HIGH (ref 70–99)

## 2023-03-22 MED ORDER — CARVEDILOL 3.125 MG PO TABS
6.2500 mg | ORAL_TABLET | Freq: Two times a day (BID) | ORAL | Status: DC
  Administered 2023-03-23 – 2023-03-26 (×6): 6.25 mg via ORAL
  Filled 2023-03-22 (×6): qty 2

## 2023-03-22 MED ORDER — CARBIDOPA-LEVODOPA 25-100 MG PO TABS
1.0000 | ORAL_TABLET | Freq: Three times a day (TID) | ORAL | Status: DC
  Administered 2023-03-23 – 2023-04-01 (×27): 1 via ORAL
  Filled 2023-03-22 (×35): qty 1

## 2023-03-22 MED ORDER — SODIUM CHLORIDE 0.9 % IV SOLN: INTRAVENOUS | Status: DC

## 2023-03-22 MED ORDER — TRAZODONE HCL 50 MG PO TABS
50.0000 mg | ORAL_TABLET | Freq: Every day | ORAL | Status: DC
  Administered 2023-03-22 – 2023-03-24 (×3): 50 mg via ORAL
  Filled 2023-03-22 (×3): qty 1

## 2023-03-22 MED ORDER — INSULIN ASPART 100 UNIT/ML IJ SOLN
0.0000 [IU] | Freq: Every day | INTRAMUSCULAR | Status: DC
  Administered 2023-03-22 – 2023-03-23 (×2): 2 [IU] via SUBCUTANEOUS

## 2023-03-22 MED ORDER — TRAMADOL HCL 50 MG PO TABS
50.0000 mg | ORAL_TABLET | Freq: Once | ORAL | Status: AC
  Administered 2023-03-22: 50 mg via ORAL
  Filled 2023-03-22: qty 1

## 2023-03-22 MED ORDER — INSULIN ASPART 100 UNIT/ML IJ SOLN
0.0000 [IU] | Freq: Three times a day (TID) | INTRAMUSCULAR | Status: DC
  Administered 2023-03-23 – 2023-03-25 (×7): 2 [IU] via SUBCUTANEOUS

## 2023-03-22 MED ORDER — SODIUM CHLORIDE 0.9 % IV SOLN
1.0000 g | INTRAVENOUS | Status: DC
  Administered 2023-03-22: 1 g via INTRAVENOUS
  Filled 2023-03-22: qty 10

## 2023-03-22 MED ORDER — ROSUVASTATIN CALCIUM 20 MG PO TABS
40.0000 mg | ORAL_TABLET | Freq: Every morning | ORAL | Status: DC
  Administered 2023-03-23 – 2023-03-26 (×4): 40 mg via ORAL
  Filled 2023-03-22 (×4): qty 2

## 2023-03-22 MED ORDER — HYDRALAZINE HCL 50 MG PO TABS
50.0000 mg | ORAL_TABLET | Freq: Three times a day (TID) | ORAL | Status: DC
  Administered 2023-03-22 – 2023-04-01 (×29): 50 mg via ORAL
  Filled 2023-03-22 (×6): qty 1
  Filled 2023-03-22 (×2): qty 2
  Filled 2023-03-22: qty 1
  Filled 2023-03-22: qty 2
  Filled 2023-03-22: qty 1
  Filled 2023-03-22: qty 2
  Filled 2023-03-22: qty 1
  Filled 2023-03-22: qty 2
  Filled 2023-03-22: qty 1
  Filled 2023-03-22: qty 2
  Filled 2023-03-22 (×2): qty 1
  Filled 2023-03-22: qty 2
  Filled 2023-03-22 (×3): qty 1
  Filled 2023-03-22: qty 2
  Filled 2023-03-22 (×3): qty 1
  Filled 2023-03-22: qty 2
  Filled 2023-03-22 (×2): qty 1

## 2023-03-22 MED ORDER — CINACALCET HCL 30 MG PO TABS
45.0000 mg | ORAL_TABLET | Freq: Every morning | ORAL | Status: DC
  Administered 2023-03-23 – 2023-04-01 (×8): 45 mg via ORAL
  Filled 2023-03-22: qty 1.5
  Filled 2023-03-22: qty 2
  Filled 2023-03-22 (×3): qty 1.5
  Filled 2023-03-22: qty 2
  Filled 2023-03-22 (×2): qty 1.5
  Filled 2023-03-22: qty 2
  Filled 2023-03-22 (×3): qty 1.5

## 2023-03-22 NOTE — ED Provider Notes (Signed)
[place] EMERGENCY DEPARTMENT AT [facility] Provider Note   CSN: [id number] Arrival date & time: 03/22/23  1805     History  Chief Complaint  Patient presents with   Hematuria    Melissa Lopez is a 72 y.o. female with pmhx of parkinsons and CKD stage 3 who presents to the ED complaining of hematuria and left flank pain. Patient's caregiver reports that patient has had 3 falls this past week and was down for at least 4 hours during one of them. Caregiver in room reports some red tinting in her urine over the past few days. Patient also endorses dysuria and increased weakness recently. Denies fevers, chills, nausea, constipation.   Hematuria       Home Medications Prior to Admission medications   Medication Sig Start Date End Date Taking? Authorizing Provider  acetaminophen (TYLENOL) 325 MG tablet Take 650 mg by mouth in the morning, at noon, and at bedtime.   Yes [provider]  apixaban (ELIQUIS) 2.5 MG TABS tablet Take 2.5 mg by mouth 2 (two) times daily.   Yes [provider]  aspirin 81 MG chewable tablet Chew 1 tablet (81 mg total) by mouth daily. 11/13/18  Yes Noralee Stain, DO  carbidopa-levodopa (SINEMET IR) 25-100 MG tablet Take 1 tablet by mouth 3 (three) times daily before meals. 09/23/22  Yes Penumalli, Glenford Bayley, MD  carvedilol (COREG) 6.25 MG tablet Take 6.25 mg by mouth 2 (two) times daily with a meal.   Yes [provider]  cinacalcet (SENSIPAR) 30 MG tablet Take 1 tablet (30 mg total) by mouth 2 (two) times daily with a meal. Patient taking differently: Take 45 mg by mouth in the morning. 06/18/21  Yes Gardenia Phlegm, MD  cyclobenzaprine (FLEXERIL) 5 MG tablet Take 5 mg by mouth at bedtime.   Yes [provider]  FLUoxetine (PROZAC) 20 MG tablet Take 40 mg by mouth at bedtime.   Yes [provider]  gabapentin (NEURONTIN) 400 MG capsule Take 400 mg by mouth at bedtime.   Yes [provider]   hydrALAZINE (APRESOLINE) 50 MG tablet Take 50 mg by mouth 3 (three) times daily.   Yes [provider]  HYDROcodone-acetaminophen (NORCO/VICODIN) 5-325 MG tablet Take 1 tablet by mouth every 6 (six) hours as needed for moderate pain. Patient taking differently: Take 1 tablet by mouth 2 (two) times daily. 08/07/21  Yes Tat, Onalee Hua, MD  lidocaine (LIDODERM) 5 % Place 2 patches onto the skin daily as needed (pain (hip & back)). (Apply hip and back )Remove & Discard patch within 12 hours or as directed by MD   Yes [provider]  Magnesium 400 MG CAPS Take 400 mg by mouth daily.   Yes [provider]  Menthol, Topical Analgesic, (BIOFREEZE EX) Apply 1 application topically 3 (three) times daily as needed (pain.).   Yes [provider]  metoCLOPramide (REGLAN) 10 MG tablet Take 10 mg by mouth in the morning and at bedtime.   Yes [provider]  mirtazapine (REMERON) 7.5 MG tablet Take 7.5 mg by mouth at bedtime.   Yes [provider]  Nutritional Supplements (,FEEDING SUPPLEMENT, PROSOURCE PLUS) liquid Take 30 mLs by mouth daily. 09/29/20  Yes Hongalgi, Maximino Greenland, MD  rosuvastatin (CRESTOR) 40 MG tablet Take 40 mg by mouth in the morning.   Yes [provider]  senna-docusate (SENOKOT-S) 8.6-50 MG tablet Take 2 tablets by mouth 2 (two) times daily.   Yes [provider]  Skin Protectants, Misc. (MINERIN CREME EX) Apply topically daily.   Yes [provider]  traZODone (DESYREL) 50 MG tablet Take 50 mg by mouth at bedtime.   Yes [provider]  UNKNOWN TO PATIENT Place 1-2 sprays into the nose daily. For Degenerative Disc Disease   Yes [provider]  polyethylene glycol (MIRALAX / GLYCOLAX) 17 g packet Take 17 g by mouth 2 (two) times daily. Patient not taking: Reported on 03/22/2023 08/07/21   Catarina Hartshorn, MD      Allergies    Contrast media [iodinated contrast media], Oxycodone-acetaminophen, Xarelto   [rivaroxaban], and Oxycodone    Review of Systems   Review of Systems  Genitourinary:  Positive for hematuria.    Physical Exam Updated Vital Signs BP (!) 171/97 (BP Location: Right Arm)   Pulse 73   Temp 98.6 F (37 C) (Oral)   Resp 19   Ht  (1.6 m)   Wt 74.8 kg   SpO2 93%   BMI 29.23 kg/m  Physical Exam Vitals and nursing note reviewed.  Constitutional:      General: She is not in acute distress.    Appearance: She is not toxic-appearing.  HENT:     Head: Normocephalic and atraumatic.     Mouth/Throat:     Mouth: Mucous membranes are moist.     Pharynx: No oropharyngeal exudate or posterior oropharyngeal erythema.  Eyes:     General: No scleral icterus.       Right eye: No discharge.        Left eye: No discharge.     Conjunctiva/sclera: Conjunctivae normal.  Cardiovascular:     Rate and Rhythm: Normal rate.     Pulses: Normal pulses.     Heart sounds: No murmur heard. Pulmonary:     Effort: Pulmonary effort is normal. No respiratory distress.     Breath sounds: Normal breath sounds. No wheezing, rhonchi or rales.  Abdominal:     General: Abdomen is flat. Bowel sounds are normal.     Palpations: Abdomen is soft.     Tenderness: There is abdominal tenderness. There is left CVA tenderness.     Comments: Abdominal tenderness to palpation of left flank. No bruising present.  Musculoskeletal:     Right lower leg: No edema.     Left lower leg: No edema.  Skin:    General: Skin is warm and dry.     Findings: No rash.  Neurological:     General: No focal deficit present.     Mental Status: She is alert. Mental status is at baseline.     Cranial Nerves: No cranial nerve deficit.  Psychiatric:        Mood and Affect: Mood normal.        Behavior: Behavior normal.     ED Results / Procedures / Treatments   Labs (all labs ordered are listed, but only abnormal results are displayed) Labs Reviewed  CBC - Abnormal; Notable for the following components:       Result Value   WBC 17.6 (*)    HCT 35.9 (*)    All other components within normal limits  BASIC METABOLIC PANEL - Abnormal; Notable for the following components:   Glucose, Bld 168 (*)    BUN 31 (*)    Creatinine, Ser 3.04 (*)    GFR, Estimated 16 (*)    All other components within normal limits  CK - Abnormal; Notable for the following  components:   Total CK 33 (*)    All other components within normal limits  URINALYSIS, ROUTINE W REFLEX MICROSCOPIC    EKG None  Radiology CT Renal Stone Study  Result Date: 03/22/2023 CLINICAL DATA:  Abdominal/flank pain, stone suspected EXAM: CT ABDOMEN AND PELVIS WITHOUT CONTRAST TECHNIQUE: Multidetector CT imaging of the abdomen and pelvis was performed following the standard protocol without IV contrast. RADIATION DOSE REDUCTION: This exam was performed according to the departmental dose-optimization program which includes automated exposure control, adjustment of the mA and/or kV according to patient size and/or use of iterative reconstruction technique. COMPARISON:  CT 08/01/2021 FINDINGS: Lower chest: Trace left pleural thickening. Mild dependent atelectasis. Pacemaker wires partially included. Hepatobiliary: Pneumobilia which is intermittently been present on prior exam. Cholecystectomy. No evidence of focal liver lesion. Pancreas: Parenchymal atrophy. No ductal dilatation or inflammation. Spleen: Normal in size without focal abnormality. Adrenals/Urinary Tract: No adrenal nodule. Again seen polycystic appearance of both kidneys with cysts of varying sizes. One of the cysts in the upper pole of the left kidney is now hyperdense likely representing interval hemorrhage. There is left hydronephrosis that is new from prior exam. Diffusely increased density within the left renal collecting system which has a slightly rounded appearance in the renal pelvis measuring 2.7 cm, series 2, image 39. This is favored to represent hemorrhage and blood clot. Some  hyperdense material is seen within the left ureter. No evidence of ureteral stone or obstruction. There is moderate left perinephric edema, with Peri ureteric fat stranding. No right hydronephrosis. No right renal calculi. Partially distended urinary bladder. There is no bladder stone. Stomach/Bowel: No bowel obstruction or inflammation. The appendix is not definitively seen. Vascular/Lymphatic: Aortic atherosclerosis. No aneurysm. There are small left retroperitoneal lymph nodes, likely reactive. Reproductive: Status post hysterectomy. No adnexal masses. Other: No free air or free fluid. No abdominal wall hernia. Musculoskeletal: Degenerative change of the hips and lumbar spine. There are no acute or suspicious osseous abnormalities. IMPRESSION: 1. Left hydronephrosis with diffuse increased density within the left renal collecting system and ureter, which has a rounded appearance in the renal pelvis. This is favored to represent hemorrhage and blood clot. The possibility of infection is also considered. No evidence of ureteral stone or obstruction. Recommend follow-up after course of treatment to ensure resolution, as renal collecting system mass is not entirely excluded. 2. Polycystic appearance of both kidneys with cysts of varying sizes. One of the cysts in the upper pole of the left kidney is now hyperdense, likely representing interval hemorrhage. Aortic Atherosclerosis (ICD10-I70.0). Electronically Signed   By: Narda Rutherford M.D.   On: 03/22/2023 19:25    Procedures Procedures    Medications Ordered in ED Medications  0.9 %  sodium chloride infusion (has no administration in time range)    ED Course/ Medical Decision Making/ A&P                             Medical Decision Making Amount and/or Complexity of Data Reviewed Labs: ordered. Radiology: ordered.    This patient presents to the ED for concern of hematuria and left flank pain, this involves an extensive number of treatment  options, and is a complaint that carries with it a high risk of complications and morbidity.  The differential diagnosis includes nephrolithiasis, UTI, pyleonephritis, AKI, renal trauma.   Co morbidities that complicate the patient evaluation  none   Lab Tests:  I Ordered, and personally  interpreted labs.  The pertinent results include:   CBC: WBC (17.6) BMP: BUN (31); Cr (3.04) UA: Pending CK: 33   Imaging Studies ordered:  I ordered imaging studies including  CT Renal Study: Left hydronephrosis with diffuse increased density within the left renal collecting system and ureter, which has a rounded appearance in the renal pelvis. This is favored to represent hemorrhage and blood clot. The possibility of infection is also considered. No evidence of ureteral stone or obstruction. Recommend follow-up after course of treatment to ensure resolution, as renal collecting system mass is not entirely excluded. Polycystic appearance of both kidneys with cysts of varying sizes. One of the cysts in the upper pole of the left kidney is now hyperdense, likely representing interval hemorrhage. I independently visualized and interpreted imaging I agree with the radiologist interpretation   Consultations Obtained:  I requested consultation with the Nephrologist,  and discussed lab and imaging findings as well as pertinent plan - they recommend: Admitting patient for further treatment of renal failure   Problem List / ED Course / Critical interventions / Medication management  7:35PM Patient presented for hematuria. On exam patient was experiencing left flank pain on palpation. Patient afebrile. History of falls of hematuria is concerning for rhabdo vs kidney trauma vs infection. Ordering CK, CBC, BMP, UA and CT renal study to further evaluate 8:47 PM Lab workup concerning for renal failure. CT renal study concerning for hematomas and kidney trauma. Patient is stable, but at this time I believe it  would be best for her to receive inpatient treatment for acute on chronic renal failure. Consulted with nephrology who agreed with plan and recommended that we start IV fluids. Consulting hospitlalist for inpatient admission.   Social Determinants of Health:  none         Final Clinical Impression(s) / ED Diagnoses Final diagnoses:  Gross hematuria  Acute renal failure with other specified pathological kidney lesion superimposed on stage 5 chronic kidney disease, not on chronic dialysis    Rx / DC Orders ED Discharge Orders     None         Margarita Rana 03/22/23 2049    Eber Hong, MD 03/22/23 2105

## 2023-03-22 NOTE — ED Triage Notes (Signed)
Daughter noticed increasing blood in urine and patient has lower left back pain. Pt has hx of stage 3 kidney disease.

## 2023-03-22 NOTE — H&P (Signed)
TRH H&P   Patient Demographics:    Melissa Lopez, is a 72 y.o. female  MRN: [id number]   DOB - 07-31-1951  Admit Date - 03/22/2023  Outpatient Primary MD for the patient is [facility]  Referring MD/NP/PA: Dr Hyacinth Meeker  Patient coming from: home  Chief Complaint  Patient presents with   Hematuria      HPI:    Melissa Lopez  is a 72 y.o. female, with a history of CKD stage III, coronary artery disease, sick sinus syndrome status post permanent pacemaker, primary hyperparathyroidism, diabetes mellitus with gastroparesis, and hypertension , and history of Parkinson disease. -Presents to ED secondary to hematuria, patient reports history of multiple falls, secondary to her Parkinson disease, she does report left flank pain, hematuria, reports she had 3 falls in the past week, was down for at least 4 hours during 1 episode of same, she was noted to have dark-colored urine, and dysuria, generalized weakness, no fever, no chills, no nausea or vomiting, which prompted her to come to ED -In ED workup significant for hematuria, and AKI with a creatinine of 3.04, from baseline of 1.37, her CT renal call was significant for blood/blood clot in her renal pelvis, [facility] hospitalist consulted to admit, I have discussed with Dr. Ronne Binning, who reports patient will not need any intervention IR, and unlikely she will need stent, and he reports patient is appropriate to be admitted to any pain.     Review of system   A full 10 point Review of Systems was done, except as stated above, all other Review of Systems were negative.   With Past History of the following :    Past Medical History:  Diagnosis Date   CKD (chronic kidney disease) stage 3, GFR 30-59 ml/min 10/31/2018   - per patient report   Coronary artery disease    per patient report - 2 caths with  non-obstructive CAD   Essential hypertension    Frequent falls    H/O cardiac pacemaker 10/31/2018   implanted 05/2010   Sick sinus syndrome    Type 2 diabetes mellitus with complication, with long-term current use of insulin 10/31/2018   with diabetic nephropathy      Past Surgical History:  Procedure Laterality Date   BIOPSY  09/28/2020   Procedure: BIOPSY;  Surgeon: Bernette Redbird, MD;  Location: [facility] ENDOSCOPY;  Service: Endoscopy;;   BIOPSY  08/03/2021   Procedure: BIOPSY;  Surgeon: Corbin Ade, MD;  Location: [facility];  Service: Endoscopy;;   CARDIAC PACEMAKER PLACEMENT  2008   in [place] ([place]) for sick sinus syndrome   CHOLECYSTECTOMY     CORONARY/GRAFT ACUTE MI REVASCULARIZATION N/A 11/09/2020   Procedure: CORONARY/GRAFT ACUTE MI REVASCULARIZATION;  Surgeon: Marykay Lex, MD;  Location: [facility] INVASIVE CV LAB;  Service: Cardiovascular;  Laterality: N/A;   ESOPHAGOGASTRODUODENOSCOPY N/A 09/28/2020  Procedure: ESOPHAGOGASTRODUODENOSCOPY (EGD);  Surgeon: Bernette Redbird, MD;  Location: [facility];  Service: Endoscopy;  Laterality: N/A;   ESOPHAGOGASTRODUODENOSCOPY (EGD) WITH PROPOFOL N/A 11/10/2018   Procedure: ESOPHAGOGASTRODUODENOSCOPY (EGD) WITH PROPOFOL;  Surgeon: Charlott Rakes, MD;  Location: [facility] ENDOSCOPY;  Service: Endoscopy;  Laterality: N/A;   ESOPHAGOGASTRODUODENOSCOPY (EGD) WITH PROPOFOL N/A 08/03/2021   severe reflux esophagitis, bile gastritis, felt to have delayed gastric emptying. Pathology from esophageal biopsies with fibrinopurulent exudate and granulation tissue consistent with ulcer base   LEFT HEART CATH AND CORONARY ANGIOGRAPHY N/A 10/31/2018   Procedure: LEFT HEART CATH AND CORONARY ANGIOGRAPHY;  Surgeon: Marykay Lex, MD;  Location: [facility] INVASIVE CV LAB;  Service: Cardiovascular;  Laterality: N/A;   PACEMAKER GENERATOR CHANGE  09/2018   in [place] ([facility])   REPLACEMENT TOTAL KNEE BILATERAL        Social History:     Social History    Tobacco Use   Smoking status: Former    Years: 3    Types: Cigarettes    Quit date: 12/09/1969    Years since quitting: 53.3   Smokeless tobacco: Never  Substance Use Topics   Alcohol use: Not Currently       Family History :     Family History  Problem Relation Age of Onset   Breast cancer Mother    Bone cancer Mother    Heart attack Father 15   Breast cancer Sister    Other Sister        MVA   Heart disease Brother        stents   Diabetes Brother    Colon cancer Neg Hx    Colon polyps Neg Hx      Home Medications:   Prior to Admission medications   Medication Sig Start Date End Date Taking? Authorizing Provider  acetaminophen (TYLENOL) 325 MG tablet Take 650 mg by mouth in the morning, at noon, and at bedtime.   Yes [provider]  apixaban (ELIQUIS) 2.5 MG TABS tablet Take 2.5 mg by mouth 2 (two) times daily.   Yes [provider]  aspirin 81 MG chewable tablet Chew 1 tablet (81 mg total) by mouth daily. 11/13/18  Yes Noralee Stain, DO  carbidopa-levodopa (SINEMET IR) 25-100 MG tablet Take 1 tablet by mouth 3 (three) times daily before meals. 09/23/22  Yes Penumalli, Glenford Bayley, MD  carvedilol (COREG) 6.25 MG tablet Take 6.25 mg by mouth 2 (two) times daily with a meal.   Yes [provider]  cinacalcet (SENSIPAR) 30 MG tablet Take 1 tablet (30 mg total) by mouth 2 (two) times daily with a meal. Patient taking differently: Take 45 mg by mouth in the morning. 06/18/21  Yes Gardenia Phlegm, MD  cyclobenzaprine (FLEXERIL) 5 MG tablet Take 5 mg by mouth at bedtime.   Yes [provider]  FLUoxetine (PROZAC) 20 MG tablet Take 40 mg by mouth at bedtime.   Yes [provider]  gabapentin (NEURONTIN) 400 MG capsule Take 400 mg by mouth at bedtime.   Yes [provider]  hydrALAZINE (APRESOLINE) 50 MG tablet Take 50 mg by mouth 3 (three) times daily.   Yes [provider]  HYDROcodone-acetaminophen (NORCO/VICODIN)  5-325 MG tablet Take 1 tablet by mouth every 6 (six) hours as needed for moderate pain. Patient taking differently: Take 1 tablet by mouth 2 (two) times daily. 08/07/21  Yes Tat, Onalee Hua, MD  lidocaine (LIDODERM) 5 % Place 2 patches onto the skin daily as  needed (pain (hip & back)). (Apply hip and back )Remove & Discard patch within 12 hours or as directed by MD   Yes [provider]  Magnesium 400 MG CAPS Take 400 mg by mouth daily.   Yes [provider]  Menthol, Topical Analgesic, (BIOFREEZE EX) Apply 1 application topically 3 (three) times daily as needed (pain.).   Yes [provider]  metoCLOPramide (REGLAN) 10 MG tablet Take 10 mg by mouth in the morning and at bedtime.   Yes [provider]  mirtazapine (REMERON) 7.5 MG tablet Take 7.5 mg by mouth at bedtime.   Yes [provider]  Nutritional Supplements (,FEEDING SUPPLEMENT, PROSOURCE PLUS) liquid Take 30 mLs by mouth daily. 09/29/20  Yes Hongalgi, Maximino Greenland, MD  rosuvastatin (CRESTOR) 40 MG tablet Take 40 mg by mouth in the morning.   Yes [provider]  senna-docusate (SENOKOT-S) 8.6-50 MG tablet Take 2 tablets by mouth 2 (two) times daily.   Yes [provider]  Skin Protectants, Misc. (MINERIN CREME EX) Apply topically daily.   Yes [provider]  traZODone (DESYREL) 50 MG tablet Take 50 mg by mouth at bedtime.   Yes [provider]  UNKNOWN TO PATIENT Place 1-2 sprays into the nose daily. For Degenerative Disc Disease   Yes [provider]  polyethylene glycol (MIRALAX / GLYCOLAX) 17 g packet Take 17 g by mouth 2 (two) times daily. Patient not taking: Reported on 03/22/2023 08/07/21   Catarina Hartshorn, MD     Allergies:     Allergies  Allergen Reactions   Contrast Media [Iodinated Contrast Media] Swelling    Contrast Dye - the one made out of shellfish, gives a really bad reaction - swelling   Oxycodone-Acetaminophen Anaphylaxis   Xarelto   [Rivaroxaban] Swelling   Oxycodone Other (See Comments)    Statused by Person:  EAST, CAMELIA(CE) on [phone]     Physical Exam:   Vitals  Blood pressure (!) 181/92, pulse 66, temperature 98.6 F (37 C), temperature source Oral, resp. rate 18, height  (1.6 m), weight 74.8 kg, SpO2 95 %.   1. General frail elderly female, laying in bed, no apparent distress  2. Normal affect and insight, Not Suicidal or Homicidal, Awake Alert, Oriented X 3.  3. No F.N deficits, ALL C.Nerves Intact, Strength 5/5 all 4 extremities, Sensation intact all 4 extremities, Plantars down going.  4. Ears and Eyes appear Normal, Conjunctivae clear, PERRLA. Moist Oral Mucosa.  5. Supple Neck, No JVD, No cervical lymphadenopathy appriciated, No Carotid Bruits.  6. Symmetrical Chest wall movement, Good air movement bilaterally, CTAB.  7. RRR, No Gallops, Rubs or Murmurs, No Parasternal Heave.  8. Positive Bowel Sounds, Abdomen Soft, No tenderness, No organomegaly appriciated,No rebound -guarding or rigidity.  9.  No Cyanosis, Normal Skin Turgor, No Skin Rash or Bruise.  10. Good muscle tone,  joints appear normal , no effusions, Normal ROM.     Data Review:    CBC Recent Labs  Lab 03/22/23 1911  WBC 17.6*  HGB 12.0  HCT 35.9*  PLT 261  MCV 90.9  MCH 30.4  MCHC 33.4  RDW 13.0   ------------------------------------------------------------------------------------------------------------------  Chemistries  Recent Labs  Lab 03/22/23 1911  NA 139  K 3.8  CL 100  CO2 25  GLUCOSE 168*  BUN 31*  CREATININE 3.04*  CALCIUM 9.4   ------------------------------------------------------------------------------------------------------------------ estimated creatinine clearance is 16.5 mL/min (A) (by C-G formula based on SCr of 3.04 mg/dL (H)). ------------------------------------------------------------------------------------------------------------------  No results for input(s):  "TSH", "T4TOTAL", "T3FREE", "THYROIDAB" in the last 72 hours.  Invalid input(s): "FREET3"  Coagulation profile No results for input(s): "INR", "PROTIME" in the last 168 hours. ------------------------------------------------------------------------------------------------------------------- No results for input(s): "DDIMER" in the last 72 hours. -------------------------------------------------------------------------------------------------------------------  Cardiac Enzymes No results for input(s): "CKMB", "TROPONINI", "MYOGLOBIN" in the last 168 hours.  Invalid input(s): "CK" ------------------------------------------------------------------------------------------------------------------ No results found for: "BNP"   ---------------------------------------------------------------------------------------------------------------  Urinalysis    Component Value Date/Time   COLORURINE RED (A) 03/22/2023 2030   APPEARANCEUR CLOUDY (A) 03/22/2023 2030   LABSPEC 1.009 03/22/2023 2030   PHURINE 6.0 03/22/2023 2030   GLUCOSEU NEGATIVE 03/22/2023 2030   HGBUR MODERATE (A) 03/22/2023 2030   BILIRUBINUR NEGATIVE 03/22/2023 2030   KETONESUR NEGATIVE 03/22/2023 2030   PROTEINUR 100 (A) 03/22/2023 2030   NITRITE NEGATIVE 03/22/2023 2030   LEUKOCYTESUR SMALL (A) 03/22/2023 2030    ----------------------------------------------------------------------------------------------------------------   Imaging Results:    CT Renal Stone Study  Result Date: 03/22/2023 CLINICAL DATA:  Abdominal/flank pain, stone suspected EXAM: CT ABDOMEN AND PELVIS WITHOUT CONTRAST TECHNIQUE: Multidetector CT imaging of the abdomen and pelvis was performed following the standard protocol without IV contrast. RADIATION DOSE REDUCTION: This exam was performed according to the departmental dose-optimization program which includes automated exposure control, adjustment of the mA and/or kV according to patient size  and/or use of iterative reconstruction technique. COMPARISON:  CT 08/01/2021 FINDINGS: Lower chest: Trace left pleural thickening. Mild dependent atelectasis. Pacemaker wires partially included. Hepatobiliary: Pneumobilia which is intermittently been present on prior exam. Cholecystectomy. No evidence of focal liver lesion. Pancreas: Parenchymal atrophy. No ductal dilatation or inflammation. Spleen: Normal in size without focal abnormality. Adrenals/Urinary Tract: No adrenal nodule. Again seen polycystic appearance of both kidneys with cysts of varying sizes. One of the cysts in the upper pole of the left kidney is now hyperdense likely representing interval hemorrhage. There is left hydronephrosis that is new from prior exam. Diffusely increased density within the left renal collecting system which has a slightly rounded appearance in the renal pelvis measuring 2.7 cm, series 2, image 39. This is favored to represent hemorrhage and blood clot. Some hyperdense material is seen within the left ureter. No evidence of ureteral stone or obstruction. There is moderate left perinephric edema, with Peri ureteric fat stranding. No right hydronephrosis. No right renal calculi. Partially distended urinary bladder. There is no bladder stone. Stomach/Bowel: No bowel obstruction or inflammation. The appendix is not definitively seen. Vascular/Lymphatic: Aortic atherosclerosis. No aneurysm. There are small left retroperitoneal lymph nodes, likely reactive. Reproductive: Status post hysterectomy. No adnexal masses. Other: No free air or free fluid. No abdominal wall hernia. Musculoskeletal: Degenerative change of the hips and lumbar spine. There are no acute or suspicious osseous abnormalities. IMPRESSION: 1. Left hydronephrosis with diffuse increased density within the left renal collecting system and ureter, which has a rounded appearance in the renal pelvis. This is favored to represent hemorrhage and blood clot. The  possibility of infection is also considered. No evidence of ureteral stone or obstruction. Recommend follow-up after course of treatment to ensure resolution, as renal collecting system mass is not entirely excluded. 2. Polycystic appearance of both kidneys with cysts of varying sizes. One of the cysts in the upper pole of the left kidney is now hyperdense, likely representing interval hemorrhage. Aortic Atherosclerosis (ICD10-I70.0). Electronically Signed   By: Narda Rutherford M.D.   On: 03/22/2023 19:25    My personal review of EKG:  PENDING   Assessment &  Plan:    Principal Problem:   AKI (acute kidney injury) Active Problems:   Diabetes mellitus with diabetic nephropathy   H/O cardiac pacemaker   Essential hypertension   Chronic diastolic CHF (congestive heart failure)   Hematemesis   Acute renal failure superimposed on stage 3b chronic kidney disease   Hematuria   Hydronephrosis  AKI on CKD stage IIIb due to left hydronephrosis due to left renal pelvis blood clot -With AKI, creatinine 3.04 on admission, baseline 1.4 -Due to obstructive uropathy from hydronephrosis due to a blood clot in left renal pelvis, likely traumatic from her fall while on Eliquis for anticoagulation -Have discussed with neurology Dr. Ronne Binning, for noW for conservative management, no indication for stenting as usually they replete, as well he reports now there is no need for nephrostomy tube as the usually passed the clot and resolve spontaneously, patient can be admitted to any pain hospital. -Keep empirically on Rocephin. -Will hold Eliquis.   Patient appears to be on Eliquis, she does not know why she is on Eliquis, I see no documentation for A-fib, she has history of pulmonary embolism, but she does report this is in very remote past, and she has not been on anticoagulation for 48, reports she has been started on it by her PACE physician for her heart!!  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 Julio Alm -Continue with home medications   Atrophic cardiomyopathy hypertrophic Cardiomyopathy -No dyspnea, no chest pain, no evidence of volume overload   Hypertension -Continue with home medication   DM2 with gastroparesis and orthostasis/dysautonomia -Check A1c and keep an insulin sliding scale during hospital stay   Prior CVA R Thalamic lacunar stroke -Hold aspirin   Chronic diastolic CHF -To be a euvolemic currently  DVT Prophylaxis SCDs   AM Labs Ordered, also please review Full Orders  Family Communication: Admission, patients condition and plan of care including tests being ordered have been discussed with the patient and daughter at bedside   who indicate understanding and agree with the plan and Code Status.  Code Status full  Likely DC to  home  Condition GUARDED    Consults called: D/W Dr Laverle Hobby, he be available for consult if needed by Monday  Admission status: inpatient     Time spent in minutes : 70 minutes   Huey Bienenstock M.D on 03/22/2023 at 9:27 PM   [facility] - Office  [phone]

## 2023-03-22 NOTE — ED Notes (Signed)
Pt now eating mcd[employer] brought by family.

## 2023-03-22 NOTE — ED Notes (Signed)
EDP approval to eat. Gave pt frozen dinner and drink.

## 2023-03-22 NOTE — ED Notes (Signed)
Pt urine was very bloody. Sample taken to lab

## 2023-03-22 NOTE — ED Notes (Signed)
Hospitalist in room.

## 2023-03-23 DIAGNOSIS — N179 Acute kidney failure, unspecified: Secondary | ICD-10-CM | POA: Diagnosis not present

## 2023-03-23 LAB — BASIC METABOLIC PANEL
Anion gap: 12 (ref 5–15)
BUN: 34 mg/dL — ABNORMAL HIGH (ref 8–23)
CO2: 22 mmol/L (ref 22–32)
Calcium: 8.9 mg/dL (ref 8.9–10.3)
Chloride: 103 mmol/L (ref 98–111)
Creatinine, Ser: 3.45 mg/dL — ABNORMAL HIGH (ref 0.44–1.00)
GFR, Estimated: 14 mL/min — ABNORMAL LOW (ref >60)
Glucose, Bld: 102 mg/dL — ABNORMAL HIGH (ref 70–99)
Potassium: 3.1 mmol/L — ABNORMAL LOW (ref 3.5–5.1)
Sodium: 137 mmol/L (ref 135–145)

## 2023-03-23 LAB — CBC
HCT: 36.2 % (ref 36.0–46.0)
Hemoglobin: 11.9 g/dL — ABNORMAL LOW (ref 12.0–15.0)
MCH: 30.4 pg (ref 26.0–34.0)
MCHC: 32.9 g/dL (ref 30.0–36.0)
MCV: 92.3 fL (ref 80.0–100.0)
Platelets: 245 K/uL (ref 150–400)
RBC: 3.92 MIL/uL (ref 3.87–5.11)
RDW: 13.1 % (ref 11.5–15.5)
WBC: 19.7 K/uL — ABNORMAL HIGH (ref 4.0–10.5)
nRBC: 0 % (ref 0.0–0.2)

## 2023-03-23 LAB — GLUCOSE, CAPILLARY
Glucose-Capillary: 169 mg/dL — ABNORMAL HIGH (ref 70–99)
Glucose-Capillary: 153 mg/dL — ABNORMAL HIGH (ref 70–99)
Glucose-Capillary: 234 mg/dL — ABNORMAL HIGH (ref 70–99)

## 2023-03-23 LAB — HEMOGLOBIN A1C
Hgb A1c MFr Bld: 5.2 % (ref 4.8–5.6)
Mean Plasma Glucose: 102.54 mg/dL

## 2023-03-23 MED ORDER — MORPHINE SULFATE (PF) 2 MG/ML IV SOLN
2.0000 mg | Freq: Once | INTRAVENOUS | Status: AC | PRN
  Administered 2023-03-23: 2 mg via INTRAVENOUS
  Filled 2023-03-23: qty 1

## 2023-03-23 MED ORDER — ACETAMINOPHEN 325 MG PO TABS
650.0000 mg | ORAL_TABLET | Freq: Four times a day (QID) | ORAL | Status: DC | PRN
  Administered 2023-03-24 – 2023-03-26 (×4): 650 mg via ORAL
  Filled 2023-03-23 (×4): qty 2

## 2023-03-23 MED ORDER — HYDROMORPHONE HCL 1 MG/ML IJ SOLN
0.5000 mg | INTRAMUSCULAR | Status: DC | PRN
  Administered 2023-03-23 – 2023-03-25 (×7): 0.5 mg via INTRAVENOUS
  Filled 2023-03-23 (×7): qty 0.5

## 2023-03-23 MED ORDER — HYDROCODONE-ACETAMINOPHEN 5-325 MG PO TABS
1.0000 | ORAL_TABLET | Freq: Four times a day (QID) | ORAL | Status: DC | PRN
  Administered 2023-03-25 – 2023-03-26 (×3): 1 via ORAL
  Filled 2023-03-23 (×4): qty 1

## 2023-03-23 MED ORDER — SODIUM CHLORIDE 0.9 % IV SOLN
2.0000 g | INTRAVENOUS | Status: AC
  Administered 2023-03-23 – 2023-03-28 (×6): 2 g via INTRAVENOUS
  Filled 2023-03-23 (×5): qty 20

## 2023-03-23 MED ORDER — TRAMADOL HCL 50 MG PO TABS
50.0000 mg | ORAL_TABLET | Freq: Once | ORAL | Status: AC
  Administered 2023-03-23: 50 mg via ORAL
  Filled 2023-03-23: qty 1

## 2023-03-23 MED ORDER — POLYETHYLENE GLYCOL 3350 17 G PO PACK: 17.0000 g | PACK | Freq: Every day | ORAL | Status: DC | PRN

## 2023-03-23 MED ORDER — MELATONIN 3 MG PO TABS
3.0000 mg | ORAL_TABLET | Freq: Every evening | ORAL | Status: DC | PRN
  Administered 2023-03-23 – 2023-03-24 (×2): 3 mg via ORAL
  Filled 2023-03-23 (×2): qty 1

## 2023-03-23 MED ORDER — HYDRALAZINE HCL 20 MG/ML IJ SOLN
5.0000 mg | Freq: Four times a day (QID) | INTRAMUSCULAR | Status: DC | PRN
  Administered 2023-03-23 – 2023-03-25 (×2): 5 mg via INTRAVENOUS
  Filled 2023-03-23 (×3): qty 1

## 2023-03-23 MED ORDER — SENNOSIDES-DOCUSATE SODIUM 8.6-50 MG PO TABS
1.0000 | ORAL_TABLET | Freq: Every day | ORAL | Status: DC
  Administered 2023-03-23 – 2023-03-29 (×7): 1 via ORAL
  Filled 2023-03-23 (×7): qty 1

## 2023-03-23 MED ORDER — HYDRALAZINE HCL 20 MG/ML IJ SOLN
5.0000 mg | INTRAMUSCULAR | Status: AC
  Administered 2023-03-23: 5 mg via INTRAVENOUS

## 2023-03-23 MED ORDER — POTASSIUM CHLORIDE CRYS ER 20 MEQ PO TBCR
40.0000 meq | EXTENDED_RELEASE_TABLET | Freq: Once | ORAL | Status: AC
  Administered 2023-03-23: 40 meq via ORAL
  Filled 2023-03-23: qty 2

## 2023-03-23 NOTE — Plan of Care (Signed)
  Problem: Acute Rehab PT Goals(only PT should resolve) Goal: Pt Will Go Supine/Side To Sit Outcome: Progressing Flowsheets (Taken 03/23/2023 1216) Pt will go Supine/Side to Sit:  with min guard assist  with minimal assist Goal: Patient Will Transfer Sit To/From Stand Outcome: Progressing Flowsheets (Taken 03/23/2023 1216) Patient will transfer sit to/from stand:  with min guard assist  with minimal assist Goal: Pt Will Transfer Bed To Chair/Chair To Bed Outcome: Progressing Flowsheets (Taken 03/23/2023 1216) Pt will Transfer Bed to Chair/Chair to Bed: with min assist Goal: Pt Will Ambulate Outcome: Progressing Flowsheets (Taken 03/23/2023 1216) Pt will Ambulate:  25 feet  with minimal assist  with rolling walker   12:17 PM, 03/23/23 Ocie Bob, MPT Physical Therapist with [facility] [phone] office [phone] mobile phone

## 2023-03-23 NOTE — Progress Notes (Signed)
   03/23/23 0900  Vitals  BP (!) 183/99  Pulse Rate 82  MEWS COLOR  MEWS Score Color Green  MEWS Score  MEWS Temp 0  MEWS Systolic 0  MEWS Pulse 0  MEWS RR 0  MEWS LOC 0  MEWS Score 0   MD Hall notified and prn hydralazine given.

## 2023-03-23 NOTE — Progress Notes (Signed)
PROGRESS NOTE  Melissa Lopez VXY:[id number] DOB: [birth year] DOA: 03/22/2023 PCP: Inc, [facility]  HPI/Recap of past 24 hours: Melissa Lopez  is a 72 y.o. female, with a history of CKD stage IIIB, coronary artery disease, sick sinus syndrome status post permanent pacemaker, primary hyperparathyroidism, type II diabetes mellitus with gastroparesis, hypertension, Parkinson disease, history of PE on Eliquis.  Who presented to [facility], ED with gross hematuria after multiple falls.  Associated with moderate to severe left flank pain.  Her daughter whom she lives with, noticed mild hematuria initially on Thursday after a fall.  It progressively worsened yesterday around 4 PM when she noticed gross hematuria.  Her daughter decided to bring her into the ED for further evaluation-  -In ED, workup revealed gross hematuria and AKI on CKD 3B.  Home Eliquis was held.  Creatinine on presentation greater than 3.0 with baseline creatinine 1.3.  CT renal was notable for blood clot in her renal pelvis for which urology was consulted.  No urological intervention planned at this time.  Due to worsening AKI, nephrology was consulted on 03/23/2023.  03/23/2023: The patient was seen and examined at bedside.  Her daughter was present in the room.  The patient endorses left flank pain that radiates to her left lower abdomen.  Assessment/Plan: Principal Problem:   AKI (acute kidney injury) Active Problems:   Diabetes mellitus with diabetic nephropathy   H/O cardiac pacemaker   Essential hypertension   Chronic diastolic CHF (congestive heart failure)   Hematemesis   Acute renal failure superimposed on stage 3b chronic kidney disease   Hematuria   Hydronephrosis  Worsening AKI on CKD 3B in the setting of gross hematuria, left hydronephrosis, left renal pelvis blood clot  -With AKI, creatinine 3.04 on admission, baseline 1.3 Creatinine is uptrending 3.45 from 3.04. -Due to  obstructive uropathy from hydronephrosis due to a blood clot in left renal pelvis, likely traumatic from her fall while on Eliquis for anticoagulation -Dr. Randol Kern discussed with urology Dr. Ronne Binning, recommends conservative management for now.  No indication for stenting.  -Keep empirically on Rocephin. Continue to hold off on Eliquis. Nephrology consulted to assist with the management.   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 Ladona Ridgel   Parkinson disease with falls Continue with home medications PT OT assessment Fall precautions   Atrophic cardiomyopathy hypertrophic Cardiomyopathy -No dyspnea, no chest pain, no evidence of volume overload   Hypertension, BP is not at goal, elevated. Resume home oral antihypertensive Continue to monitor vital signs   DM2 with gastroparesis and orthostasis/dysautonomia Orthostasis, dysautonomia likely related to Parkinson disease -Check A1c and keep an insulin sliding scale during hospital stay   Prior CVA R Thalamic lacunar stroke -Hold aspirin due to acute gross hematuria   Chronic diastolic CHF Euvolemic on exam. Start strict I's and O's and daily weight Closely monitor volume status while on IV fluid.   DVT Prophylaxis SCDs.  Home Eliquis on hold due  to acute gross hematuria.   AM Labs Ordered, also please review Full Orders    Code Status full   Likely DC to home, lives with her  daughter.   Code Status: Full code  Family Communication: Updated her daughter at bedside.  Disposition Plan: Likely will discharge to home with home health services once nephrology and neurology signed off.   Consultants: Urology Nephrology  Procedures: None.  Antimicrobials: Rocephin  DVT prophylaxis: SCDs  Status is: Inpatient The patient requires at least 2 midnights for further evaluation and treatment of present condition.    Objective: Vitals:   03/23/23 0837 03/23/23 0900 03/23/23 1001 03/23/23 1036  BP: (!) 200/111 (!) 183/99 (!) 179/114 (!) 159/103  Pulse:  82  90  Resp:      Temp:      TempSrc:      SpO2:      Weight:      Height:        Intake/Output Summary (Last 24 hours) at 03/23/2023 1137 Last data filed at 03/23/2023 0900 Gross per 24 hour  Intake 653.94 ml  Output --  Net 653.94 ml   Filed Weights   03/22/23 1825 03/22/23 2300  Weight: 74.8 kg 75.5 kg    Exam:  General: 72 y.o. year-old female frail-appearing in no acute distress.  Alert and interactive. Cardiovascular: Regular rate and rhythm with no rubs or gallops.  No thyromegaly or JVD noted.   Respiratory: Clear to auscultation with no wheezes or rales.  Poor inspiratory effort. Abdomen: Soft nontender nondistended with normal bowel sounds x4 quadrants. Musculoskeletal: No lower extremity edema bilaterally. Skin: No ulcerative lesions noted or rashes, Psychiatry: Mood is appropriate for condition and setting   Data Reviewed: CBC: Recent Labs  Lab 03/22/23 1911 03/23/23 0420  WBC 17.6* 19.7*  HGB 12.0 11.9*  HCT 35.9* 36.2  MCV 90.9 92.3  PLT 261 245   Basic Metabolic Panel: Recent Labs  Lab 03/22/23 1911 03/23/23 0420  NA 139 137  K 3.8 3.1*  CL 100 103  CO2 25 22  GLUCOSE 168* 102*  BUN 31* 34*  CREATININE 3.04* 3.45*  CALCIUM 9.4 8.9   GFR: Estimated Creatinine Clearance: 14.5 mL/min (A) (by C-G formula based on SCr of 3.45 mg/dL (H)). Liver Function  Tests: No results for input(s): "AST", "ALT", "ALKPHOS", "BILITOT", "PROT", "ALBUMIN" in the last 168 hours. No results for input(s): "LIPASE", "AMYLASE" in the last 168 hours. No results for input(s): "AMMONIA" in the last 168 hours. Coagulation Profile: No results for input(s): "INR", "PROTIME" in the last 168 hours. Cardiac Enzymes: Recent Labs  Lab 03/22/23 1911  CKTOTAL 33*   BNP (last 3 results) No results for input(s): "PROBNP" in the last 8760 hours. HbA1C: No results for input(s): "HGBA1C" in the last 72 hours. CBG: Recent Labs  Lab 03/22/23 2314 03/23/23 1114  GLUCAP 222* 169*   Lipid Profile: No results for input(s): "CHOL", "HDL", "LDLCALC", "TRIG", "CHOLHDL", "LDLDIRECT" in the last 72 hours. Thyroid Function Tests: No results for input(s): "TSH", "T4TOTAL", "FREET4", "T3FREE", "THYROIDAB" in the last 72 hours. Anemia Panel: No results for input(s): "VITAMINB12", "FOLATE", "FERRITIN", "TIBC", "IRON", "RETICCTPCT" in the last 72 hours. Urine analysis:    Component Value Date/Time   COLORURINE RED (A) 03/22/2023 2030   APPEARANCEUR CLOUDY (A) 03/22/2023 2030   LABSPEC 1.009 03/22/2023 2030   PHURINE 6.0 03/22/2023 2030   GLUCOSEU NEGATIVE 03/22/2023 2030   HGBUR MODERATE (A) 03/22/2023 2030   BILIRUBINUR NEGATIVE 03/22/2023 2030  KETONESUR NEGATIVE 03/22/2023 2030   PROTEINUR 100 (A) 03/22/2023 2030   NITRITE NEGATIVE 03/22/2023 2030   LEUKOCYTESUR SMALL (A) 03/22/2023 2030   Sepsis Labs: (procalcitonin:4,lacticidven:4)  )No results found for this or any previous visit (from the past 240 hour(s)).    Studies: CT Renal Stone Study  Result Date: 03/22/2023 CLINICAL DATA:  Abdominal/flank pain, stone suspected EXAM: CT ABDOMEN AND PELVIS WITHOUT CONTRAST TECHNIQUE: Multidetector CT imaging of the abdomen and pelvis was performed following the standard protocol without IV contrast. RADIATION DOSE REDUCTION: This exam was performed according to  the departmental dose-optimization program which includes automated exposure control, adjustment of the mA and/or kV according to patient size and/or use of iterative reconstruction technique. COMPARISON:  CT 08/01/2021 FINDINGS: Lower chest: Trace left pleural thickening. Mild dependent atelectasis. Pacemaker wires partially included. Hepatobiliary: Pneumobilia which is intermittently been present on prior exam. Cholecystectomy. No evidence of focal liver lesion. Pancreas: Parenchymal atrophy. No ductal dilatation or inflammation. Spleen: Normal in size without focal abnormality. Adrenals/Urinary Tract: No adrenal nodule. Again seen polycystic appearance of both kidneys with cysts of varying sizes. One of the cysts in the upper pole of the left kidney is now hyperdense likely representing interval hemorrhage. There is left hydronephrosis that is new from prior exam. Diffusely increased density within the left renal collecting system which has a slightly rounded appearance in the renal pelvis measuring 2.7 cm, series 2, image 39. This is favored to represent hemorrhage and blood clot. Some hyperdense material is seen within the left ureter. No evidence of ureteral stone or obstruction. There is moderate left perinephric edema, with Peri ureteric fat stranding. No right hydronephrosis. No right renal calculi. Partially distended urinary bladder. There is no bladder stone. Stomach/Bowel: No bowel obstruction or inflammation. The appendix is not definitively seen. Vascular/Lymphatic: Aortic atherosclerosis. No aneurysm. There are small left retroperitoneal lymph nodes, likely reactive. Reproductive: Status post hysterectomy. No adnexal masses. Other: No free air or free fluid. No abdominal wall hernia. Musculoskeletal: Degenerative change of the hips and lumbar spine. There are no acute or suspicious osseous abnormalities. IMPRESSION: 1. Left hydronephrosis with diffuse increased density within the left renal  collecting system and ureter, which has a rounded appearance in the renal pelvis. This is favored to represent hemorrhage and blood clot. The possibility of infection is also considered. No evidence of ureteral stone or obstruction. Recommend follow-up after course of treatment to ensure resolution, as renal collecting system mass is not entirely excluded. 2. Polycystic appearance of both kidneys with cysts of varying sizes. One of the cysts in the upper pole of the left kidney is now hyperdense, likely representing interval hemorrhage. Aortic Atherosclerosis (ICD10-I70.0). Electronically Signed   By: Narda Rutherford M.D.   On: 03/22/2023 19:25    Scheduled Meds:  carbidopa-levodopa  1 tablet Oral TID AC   carvedilol  6.25 mg Oral BID WC   cinacalcet  45 mg Oral q AM   hydrALAZINE  50 mg Oral TID   insulin aspart  0-5 Units Subcutaneous QHS   insulin aspart  0-9 Units Subcutaneous TID WC   rosuvastatin  40 mg Oral q AM   senna-docusate  1 tablet Oral QHS   traZODone  50 mg Oral QHS    Continuous Infusions:  sodium chloride 75 mL/hr at 03/22/23 2318   cefTRIAXone (ROCEPHIN)  IV 1 g (03/22/23 2348)     LOS: 1 day     Darlin Drop, MD [facility] Pager [phone]  If 7PM-7AM,  please contact night-coverage [email or web address] Password [facility] 03/23/2023, 11:37 AM

## 2023-03-23 NOTE — Evaluation (Signed)
Physical Therapy Evaluation Patient Details Name: Melissa Lopez MRN: [id number] DOB: 1951/04/09 Today's Date: 03/23/2023  History of Present Illness  Melissa Lopez  is a 72 y.o. female, with a history of CKD stage III, coronary artery disease, sick sinus syndrome status post permanent pacemaker, primary hyperparathyroidism, diabetes mellitus with gastroparesis, and hypertension , and history of Parkinson disease.  -Presents to ED secondary to hematuria, patient reports history of multiple falls, secondary to her Parkinson disease, she does report left flank pain, hematuria, reports she had 3 falls in the past week, was down for at least 4 hours during 1 episode of same, she was noted to have dark-colored urine, and dysuria, generalized weakness, no fever, no chills, no nausea or vomiting, which prompted her to come to ED   Clinical Impression  Patient demonstrates slow labored movement for sitting up at bedside, once on feet limited to a few side steps, steps forward/backward before having to sit due to c/o fatigue and generalized weakness.  Patient tolerated sitting up in chair with her daughter present after therapy.  Patient will benefit from continued skilled physical therapy in hospital and recommended venue below to increase strength, balance, endurance for safe ADLs and gait.         Recommendations for follow up therapy are one component of a multi-disciplinary discharge planning process, led by the attending physician.  Recommendations may be updated based on patient status, additional functional criteria and insurance authorization.  Follow Up Recommendations       Assistance Recommended at Discharge Intermittent Supervision/Assistance  Patient can return home with the following  A lot of help with bathing/dressing/bathroom;A lot of help with walking and/or transfers;Help with stairs or ramp for entrance;Assistance with cooking/housework    Equipment Recommendations None  recommended by PT  Recommendations for Other Services       Functional Status Assessment Patient has had a recent decline in their functional status and demonstrates the ability to make significant improvements in function in a reasonable and predictable amount of time.     Precautions / Restrictions Precautions Precautions: Fall Restrictions Weight Bearing Restrictions: No      Mobility  Bed Mobility Overal bed mobility: Needs Assistance Bed Mobility: Supine to Sit     Supine to sit: Min assist, Mod assist     General bed mobility comments: increased time, labored movement    Transfers Overall transfer level: Needs assistance Equipment used: Rolling walker (2 wheels) Transfers: Sit to/from Stand, Bed to chair/wheelchair/BSC Sit to Stand: Min assist, Mod assist   Step pivot transfers: Min assist, Mod assist       General transfer comment: slow labored movement requiring frequent verbal/tactile cueing for safety    Ambulation/Gait Ambulation/Gait assistance: Mod assist Gait Distance (Feet): 5 Feet Assistive device: Rolling walker (2 wheels) Gait Pattern/deviations: Decreased step length - right, Decreased step length - left, Decreased stride length Gait velocity: slow     General Gait Details: limited to a few slow labored side steps, steps forward/backward at bedside before having to sit due to c/o fatigue and generalized weakness  Stairs            Wheelchair Mobility    Modified Rankin (Stroke Patients Only)       Balance Overall balance assessment: Needs assistance Sitting-balance support: Feet supported, No upper extremity supported Sitting balance-Leahy Scale: Fair Sitting balance - Comments: fair/good seated at EOB   Standing balance support: During functional activity, Bilateral upper extremity supported, Reliant on assistive device for balance  Standing balance-Leahy Scale: Poor Standing balance comment: fair/poor using RW                              Pertinent Vitals/Pain Pain Assessment Pain Assessment: No/denies pain    Home Living Family/patient expects to be discharged to:: Private residence Living Arrangements: Children Available Help at Discharge: Family;Available 24 hours/day Type of Home: House Home Access: Stairs to enter Entrance Stairs-Rails: Right Entrance Stairs-Number of Steps: 2   Home Layout: Multi-level;Able to live on main level with bedroom/bathroom Home Equipment: Rolling Walker (2 wheels);Grab bars - tub/shower;Cane - single point;Wheelchair - manual      Prior Function Prior Level of Function : Needs assist       Physical Assist : Mobility (physical);ADLs (physical) Mobility (physical): Bed mobility;Transfers;Gait;Stairs   Mobility Comments: Assisted household ambulation using RW ADLs Comments: Assisted by family, aides at [facility]     Hand Dominance   Dominant Hand: Right    Extremity/Trunk Assessment   Upper Extremity Assessment Upper Extremity Assessment: Generalized weakness    Lower Extremity Assessment Lower Extremity Assessment: Generalized weakness    Cervical / Trunk Assessment Cervical / Trunk Assessment: Normal  Communication   Communication: No difficulties  Cognition Arousal/Alertness: Awake/alert Behavior During Therapy: WFL for tasks assessed/performed Overall Cognitive Status: Within Functional Limits for tasks assessed                                          General Comments      Exercises     Assessment/Plan    PT Assessment Patient needs continued PT services  PT Problem List Decreased strength;Decreased activity tolerance;Decreased balance;Decreased mobility       PT Treatment Interventions DME instruction;Gait training;Stair training;Functional mobility training;Therapeutic activities;Therapeutic exercise;Patient/family education;Balance training    PT Goals (Current goals can be found in the Care Plan section)   Acute Rehab PT Goals Patient Stated Goal: return home with family to assist PT Goal Formulation: With patient/family Time For Goal Achievement: 03/27/23 Potential to Achieve Goals: Good    Frequency Min 3X/week     Co-evaluation               AM-PAC PT "6 Clicks" Mobility  Outcome Measure Help needed turning from your back to your side while in a flat bed without using bedrails?: A Little Help needed moving from lying on your back to sitting on the side of a flat bed without using bedrails?: A Lot Help needed moving to and from a bed to a chair (including a wheelchair)?: A Lot Help needed standing up from a chair using your arms (e.g., wheelchair or bedside chair)?: A Lot Help needed to walk in hospital room?: A Lot Help needed climbing 3-5 steps with a railing? : A Lot 6 Click Score: 13    End of Session   Activity Tolerance: Patient tolerated treatment well;Patient limited by fatigue Patient left: in chair;with call bell/phone within reach;with family/visitor present Nurse Communication: Mobility status PT Visit Diagnosis: Unsteadiness on feet (R26.81);Other abnormalities of gait and mobility (R26.89);Muscle weakness (generalized) (M62.81)    Time: 0933-1000 PT Time Calculation (min) (ACUTE ONLY): 27 min   Charges:   PT Evaluation $PT Eval Moderate Complexity: 1 Mod PT Treatments $Therapeutic Activity: 23-37 mins        12:15 PM, 03/23/23 Ocie Bob, MPT Physical Therapist with  [facility] 336 [phone] office [phone] mobile phone

## 2023-03-23 NOTE — TOC Initial Note (Signed)
Transition of Care [facility]) - Initial/Assessment Note    Patient Details  Name: Melissa Lopez MRN: [id number] Date of Birth: Apr 20, 1951  Transition of Care (TOC) CM/SW Contact:    Catalina Gravel, LCSW Phone Number: 03/23/2023, 1:54 PM  Clinical Narrative:                 CSW spoke with daughter who assists pt.  HHPT recommended and daughter is interested.Pt currently has [facility] and goes to outpatient PT MWF. Woulf like to add in home PT T&Th. Daughter nit certain if [facility] has to provide as that is insurer and where outpatient is delivered.  DC1-2 days- Needs HHPT referral.  Expected Discharge Plan: Home w Home Health Services Barriers to Discharge: Continued Medical Work up   Patient Goals and CMS Choice Patient states their goals for this hospitalization and ongoing recovery are:: Home   Choice offered to / list presented to : Adult Children, Patient      Expected Discharge Plan and Services       Living arrangements for the past 2 months: Single Family Home                                      Prior Living Arrangements/Services Living arrangements for the past 2 months: Single Family Home Lives with:: Spouse                   Activities of Daily Living Home Assistive Devices/Equipment: Bedside commode/3-in-1, Wheelchair, Environmental consultant (specify type), Cane (specify quad or straight) ADL Screening (condition at time of admission) Patient's cognitive ability adequate to safely complete daily activities?: Yes Is the patient deaf or have difficulty hearing?: No Does the patient have difficulty seeing, even when wearing glasses/contacts?: No Does the patient have difficulty concentrating, remembering, or making decisions?: No Patient able to express need for assistance with ADLs?: Yes Does the patient have difficulty dressing or bathing?: Yes Independently performs ADLs?: Yes (appropriate for developmental age) Does the patient have  difficulty walking or climbing stairs?: Yes Weakness of Legs: Both Weakness of Arms/Hands: Both  Permission Sought/Granted                  Emotional Assessment              Admission diagnosis:  Gross hematuria [R31.0] AKI (acute kidney injury) [N17.9] Hematuria [R31.9] Acute renal failure with other specified pathological kidney lesion superimposed on stage 5 chronic kidney disease, not on chronic dialysis [N17.8, N18.5] Patient Active Problem List   Diagnosis Date Noted   Hematuria 03/22/2023   Hydronephrosis 03/22/2023   GERD (gastroesophageal reflux disease) 01/01/2022   Constipation    Abdominal pain    Acute renal failure superimposed on stage 3b chronic kidney disease 08/02/2021   Hematemesis 08/01/2021   Orthostasis    Seizure-like activity 06/13/2021   Loss of weight    Primary hypertension    Chest pain of uncertain etiology 11/09/2020   Primary hyperparathyroidism 09/25/2020   Emesis    Hypophosphatemia    Hypokalemia 09/22/2020   Chronic diastolic CHF (congestive heart failure) 09/22/2020   Noninfective gastroenteritis and colitis, unspecified 04/13/2020   Unilateral primary osteoarthritis, right knee 04/13/2020   Arthritis 04/13/2020   Bradycardia 04/13/2020   Cardiomyopathy, hypertrophic 04/13/2020   Class 1 obesity due to excess calories with body mass index (BMI) of 34.0 to 34.9 in adult  04/13/2020   Acute kidney failure, unspecified 04/29/2019   Intractable nausea and vomiting 11/10/2018   Malnutrition of moderate degree 11/09/2018   AKI (acute kidney injury)    PE (pulmonary thromboembolism)    H/O cardiac pacemaker 10/31/2018   Syncope 10/31/2018   ST elevation on EKG without MI 10/31/2018   Chest pain with high risk of acute coronary syndrome 10/31/2018   Diabetes mellitus with diabetic nephropathy 10/31/2018   Type 2 diabetes mellitus with complication, with long-term current use of insulin 10/31/2018   CKD (chronic kidney disease)  stage 3, GFR 30-59 ml/min (HCC) - per patient report 10/31/2018   Presence of cardiac pacemaker 10/31/2018   Essential hypertension    Hypercalcemia 07/27/2015   PCP:  Inc, [facility] Pharmacy:   [facility] [phone] - [place], [place] - [address] AT [facility] [address] & [address] [place] [address] Phone: [phone] Fax: [phone]  [facility] - [place], [place] - [address] [place] [address] Phone: [phone] Fax: [phone]     Social Determinants of Health (SDOH) Social History: SDOH Screenings   Food Insecurity: No Food Insecurity (03/22/2023)  Housing: Low Risk  (03/22/2023)  Transportation Needs: No Transportation Needs (03/22/2023)  Utilities: Not At Risk (03/22/2023)  Tobacco Use: Medium Risk (03/22/2023)   SDOH Interventions:     Readmission Risk Interventions    11/17/2020   12:32 PM  Readmission Risk Prevention Plan  Transportation Screening Complete  PCP or Specialist Appt within 3-5 Days Complete  HRI or Home Care Consult Complete  Social Work Consult for Recovery Care Planning/Counseling Complete  Palliative Care Screening Not Applicable  Medication Review Oceanographer) Complete

## 2023-03-24 ENCOUNTER — Ambulatory Visit: Payer: Medicare (Managed Care) | Admitting: Diagnostic Neuroimaging

## 2023-03-24 ENCOUNTER — Encounter: Payer: Self-pay | Admitting: Diagnostic Neuroimaging

## 2023-03-24 DIAGNOSIS — N179 Acute kidney failure, unspecified: Secondary | ICD-10-CM | POA: Diagnosis not present

## 2023-03-24 DIAGNOSIS — E0821 Diabetes mellitus due to underlying condition with diabetic nephropathy: Secondary | ICD-10-CM | POA: Diagnosis not present

## 2023-03-24 DIAGNOSIS — I10 Essential (primary) hypertension: Secondary | ICD-10-CM

## 2023-03-24 DIAGNOSIS — N1330 Unspecified hydronephrosis: Secondary | ICD-10-CM

## 2023-03-24 DIAGNOSIS — R310 Gross hematuria: Secondary | ICD-10-CM | POA: Diagnosis not present

## 2023-03-24 LAB — CBC
HCT: 32 % — ABNORMAL LOW (ref 36.0–46.0)
Hemoglobin: 10.5 g/dL — ABNORMAL LOW (ref 12.0–15.0)
MCH: 30.5 pg (ref 26.0–34.0)
MCHC: 32.8 g/dL (ref 30.0–36.0)
MCV: 93 fL (ref 80.0–100.0)
Platelets: 189 10*3/uL (ref 150–400)
RBC: 3.44 MIL/uL — ABNORMAL LOW (ref 3.87–5.11)
RDW: 13.1 % (ref 11.5–15.5)
WBC: 22 10*3/uL — ABNORMAL HIGH (ref 4.0–10.5)
nRBC: 0 % (ref 0.0–0.2)

## 2023-03-24 LAB — GLUCOSE, CAPILLARY
Glucose-Capillary: 152 mg/dL — ABNORMAL HIGH (ref 70–99)
Glucose-Capillary: 111 mg/dL — ABNORMAL HIGH (ref 70–99)
Glucose-Capillary: 171 mg/dL — ABNORMAL HIGH (ref 70–99)
Glucose-Capillary: 174 mg/dL — ABNORMAL HIGH (ref 70–99)
Glucose-Capillary: 152 mg/dL — ABNORMAL HIGH (ref 70–99)

## 2023-03-24 LAB — BASIC METABOLIC PANEL
Anion gap: 7 (ref 5–15)
BUN: 36 mg/dL — ABNORMAL HIGH (ref 8–23)
CO2: 21 mmol/L — ABNORMAL LOW (ref 22–32)
Calcium: 8.1 mg/dL — ABNORMAL LOW (ref 8.9–10.3)
Chloride: 107 mmol/L (ref 98–111)
Creatinine, Ser: 3.91 mg/dL — ABNORMAL HIGH (ref 0.44–1.00)
GFR, Estimated: 12 mL/min — ABNORMAL LOW (ref >60)
Glucose, Bld: 107 mg/dL — ABNORMAL HIGH (ref 70–99)
Potassium: 3.9 mmol/L (ref 3.5–5.1)
Sodium: 135 mmol/L (ref 135–145)

## 2023-03-24 MED ORDER — CINACALCET HCL 30 MG PO TABS
30.0000 mg | ORAL_TABLET | Freq: Every day | ORAL | Status: AC
  Administered 2023-03-24: 30 mg via ORAL
  Filled 2023-03-24: qty 1

## 2023-03-24 MED ORDER — GUAIFENESIN-DM 100-10 MG/5ML PO SYRP: 5.0000 mL | ORAL_SOLUTION | ORAL | Status: DC | PRN

## 2023-03-24 NOTE — Progress Notes (Addendum)
Pt c/o pain in her left arm and is unable to lift. Pt's left hand also appears to be swollen. PRN pain medication administered as ordered and left arm elevated. MD made aware. Will continue to monitor.

## 2023-03-24 NOTE — Progress Notes (Signed)
OT Cancellation Note  Patient Details Name: Melissa Lopez MRN: [id number] DOB: [birth year]   Cancelled Treatment:    Reason Eval/Treat Not Completed: Patient at procedure or test/ unavailable. Attempted OT evaluation x2 this am, nursing staff/students working with pt. Will attempt at a later time as schedule allows.    Ezra Sites, OTR/L  [phone] 03/24/2023, 9:35 AM

## 2023-03-24 NOTE — Consult Note (Signed)
Reason for Consult: AKI/CKD stage IIIb Referring Physician: Nelson Chimes, MD  Melissa Lopez is an 72 y.o. female with a PMH significant for HTN, DM type 2, CAD, primary hyperparathyroidism, SSS s/p PPM, chronic diastolic CHF, possible polycystic kidney disease, CKD stage IIIb (last known Scr was 1.37 on 08/07/21), and Parkinson's disease who presented to [facility] ED on 03/22/23 with gross hematuria associated with left flank pain.  She also admits to 3 falls in the past week.  In the ED, afebrile, VSS.  Exam with abdominal pain and left flank tenderness.  Labs notable for WBC 17.6, BUN 31, Cr 3.04, CK 33.  CT scan with left hydronephrosis and increased density within the left renal collecting system and ureter.  No evidence of ureteral stone.  Polycystic appearance of both kidneys.  We were consulted due to the development of AKI/CKD Stage IIIb.  Urology was consulted, however she has not yet been seen.  The trend in Scr is seen below.   Eliquis has been held.    She continues to complain of left flank pain but denies any N/V/dysgeusia.  Trend in Creatinine: Creatinine, Ser  Date/Time Value Ref Range Status  03/24/2023 06:19 AM 3.91 (H) 0.44 - 1.00 mg/dL Final  16/09/9603 54:09 AM 3.45 (H) 0.44 - 1.00 mg/dL Final  81/19/1478 29:56 PM 3.04 (H) 0.44 - 1.00 mg/dL Final  21/30/8657 84:69 AM 1.37 (H) 0.44 - 1.00 mg/dL Final  62/95/2841 32:44 AM 1.48 (H) 0.44 - 1.00 mg/dL Final  12/11/7251 66:44 AM 1.55 (H) 0.44 - 1.00 mg/dL Final  03/47/4259 56:38 AM 1.76 (H) 0.44 - 1.00 mg/dL Final  75/64/3329 51:88 AM 2.09 (H) 0.44 - 1.00 mg/dL Final  41/66/0630 16:01 AM 2.04 (H) 0.44 - 1.00 mg/dL Final  09/32/3557 32:20 PM 2.50 (H) 0.44 - 1.00 mg/dL Final  25/42/7062 37:62 PM 2.46 (H) 0.44 - 1.00 mg/dL Final  83/15/1761 60:73 AM 1.86 (H) 0.44 - 1.00 mg/dL Final  71/05/2693 85:46 AM 1.52 (H) 0.44 - 1.00 mg/dL Final  27/02/5008 38:18 AM 1.47 (H) 0.44 - 1.00 mg/dL Final  29/93/7169 67:89 AM 1.45 (H) 0.44 - 1.00 mg/dL Final   38/09/1750 02:58 AM 1.65 (H) 0.44 - 1.00 mg/dL Final  52/77/8242 35:36 PM 1.89 (H) 0.44 - 1.00 mg/dL Final  14/43/1540 08:67 AM 2.03 (H) 0.44 - 1.00 mg/dL Final  61/95/0932 67:12 PM 1.86 (H) 0.44 - 1.00 mg/dL Final  45/80/9983 38:25 PM 2.51 (H) 0.44 - 1.00 mg/dL Final  05/39/7673 41:93 AM 1.54 (H) 0.44 - 1.00 mg/dL Final  79/01/4096 35:32 AM 1.53 (H) 0.44 - 1.00 mg/dL Final  99/24/2683 41:96 AM 1.54 (H) 0.44 - 1.00 mg/dL Final  22/29/7989 21:19 AM 1.71 (H) 0.44 - 1.00 mg/dL Final  41/74/0814 48:18 PM 1.51 (H) 0.44 - 1.00 mg/dL Final  56/31/4970 26:37 AM 1.63 (H) 0.44 - 1.00 mg/dL Final  85/88/5027 74:12 AM 1.51 (H) 0.44 - 1.00 mg/dL Final  87/86/7672 09:47 PM 1.58 (H) 0.44 - 1.00 mg/dL Final  09/62/8366 29:47 PM 1.76 (H) 0.44 - 1.00 mg/dL Final  65/46/5035 46:56 PM 1.98 (H) 0.44 - 1.00 mg/dL Final  81/27/5170 01:74 PM 1.83 (H) 0.44 - 1.00 mg/dL Final  94/49/6759 16:38 AM 1.74 (H) 0.44 - 1.00 mg/dL Final  46/65/9935 70:17 AM 1.89 (H) 0.44 - 1.00 mg/dL Final  79/39/0300 92:33 AM 1.99 (H) 0.44 - 1.00 mg/dL Final  00/76/2263 33:54 AM 1.99 (H) 0.44 - 1.00 mg/dL Final  56/25/6389 37:34 AM 2.09 (H) 0.44 - 1.00 mg/dL Final  28/76/8115 72:62 AM  1.88 (H) 0.44 - 1.00 mg/dL Final  38/25/0539 76:73 AM 1.79 (H) 0.44 - 1.00 mg/dL Final  41/93/7902 40:97 PM 1.56 (H) 0.44 - 1.00 mg/dL Final  35/32/9924 26:83 AM 1.76 (H) 0.44 - 1.00 mg/dL Final  41/96/2229 79:89 AM 1.85 (H) 0.44 - 1.00 mg/dL Final  21/19/4174 08:14 AM 1.98 (H) 0.44 - 1.00 mg/dL Final  48/18/5631 49:70 AM 2.10 (H) 0.44 - 1.00 mg/dL Final  26/37/8588 50:27 AM 1.96 (H) 0.44 - 1.00 mg/dL Final  74/11/8785 76:72 AM 1.85 (H) 0.44 - 1.00 mg/dL Final  09/47/0962 83:66 AM 1.85 (H) 0.44 - 1.00 mg/dL Final  29/47/6546 50:35 AM 1.97 (H) 0.44 - 1.00 mg/dL Final  46/56/8127 51:70 AM 2.10 (H) 0.44 - 1.00 mg/dL Final  01/74/9449 67:59 AM 2.85 (H) 0.44 - 1.00 mg/dL Final  16/38/4665 99:35 PM 3.64 (H) 0.44 - 1.00 mg/dL Final  70/17/7939 03:00 PM  3.80 (H) 0.44 - 1.00 mg/dL Final  92/33/0076 22:63 PM 2.50 (H) 0.44 - 1.00 mg/dL Final    PMH:   Past Medical History:  Diagnosis Date   CKD (chronic kidney disease) stage 3, GFR 30-59 ml/min 10/31/2018   - per patient report   Coronary artery disease    per patient report - 2 caths with non-obstructive CAD   Essential hypertension    Frequent falls    H/O cardiac pacemaker 10/31/2018   implanted 05/2010   Sick sinus syndrome    Type 2 diabetes mellitus with complication, with long-term current use of insulin 10/31/2018   with diabetic nephropathy    PSH:   Past Surgical History:  Procedure Laterality Date   BIOPSY  09/28/2020   Procedure: BIOPSY;  Surgeon: Bernette Redbird, MD;  Location: [facility];  Service: Endoscopy;;   BIOPSY  08/03/2021   Procedure: BIOPSY;  Surgeon: Corbin Ade, MD;  Location: [facility];  Service: Endoscopy;;   CARDIAC PACEMAKER PLACEMENT  2008   in [place] ([facility]) for sick sinus syndrome   CHOLECYSTECTOMY     CORONARY/GRAFT ACUTE MI REVASCULARIZATION N/A 11/09/2020   Procedure: CORONARY/GRAFT ACUTE MI REVASCULARIZATION;  Surgeon: Marykay Lex, MD;  Location: [facility] INVASIVE CV LAB;  Service: Cardiovascular;  Laterality: N/A;   ESOPHAGOGASTRODUODENOSCOPY N/A 09/28/2020   Procedure: ESOPHAGOGASTRODUODENOSCOPY (EGD);  Surgeon: Bernette Redbird, MD;  Location: [facility];  Service: Endoscopy;  Laterality: N/A;   ESOPHAGOGASTRODUODENOSCOPY (EGD) WITH PROPOFOL N/A 11/10/2018   Procedure: ESOPHAGOGASTRODUODENOSCOPY (EGD) WITH PROPOFOL;  Surgeon: Charlott Rakes, MD;  Location: [facility] ENDOSCOPY;  Service: Endoscopy;  Laterality: N/A;   ESOPHAGOGASTRODUODENOSCOPY (EGD) WITH PROPOFOL N/A 08/03/2021   severe reflux esophagitis, bile gastritis, felt to have delayed gastric emptying. Pathology from esophageal biopsies with fibrinopurulent exudate and granulation tissue consistent with ulcer base   LEFT HEART CATH AND CORONARY ANGIOGRAPHY N/A 10/31/2018    Procedure: LEFT HEART CATH AND CORONARY ANGIOGRAPHY;  Surgeon: Marykay Lex, MD;  Location[facility] INVASIVE CV LAB;  Service: Cardiovascular;  Laterality: N/A;   PACEMAKER GENERATOR CHANGE  09/2018   in [place] ([facility])   REPLACEMENT TOTAL KNEE BILATERAL      Allergies:  Allergies  Allergen Reactions   Contrast Media [Iodinated Contrast Media] Swelling    Contrast Dye - the one made out of shellfish, gives a really bad reaction - swelling   Oxycodone-Acetaminophen Anaphylaxis   Xarelto  [Rivaroxaban] Swelling   Oxycodone Other (See Comments)    Statused by Person:  EAST, CAMELIA(CE) on [phone]    Medications:   Prior to Admission medications  Medication Sig Start Date End Date Taking? Authorizing Provider  acetaminophen (TYLENOL) 325 MG tablet Take 650 mg by mouth in the morning, at noon, and at bedtime.   Yes [provider]  apixaban (ELIQUIS) 2.5 MG TABS tablet Take 2.5 mg by mouth 2 (two) times daily.   Yes [provider]  aspirin 81 MG chewable tablet Chew 1 tablet (81 mg total) by mouth daily. 11/13/18  Yes Noralee Stain, DO  carbidopa-levodopa (SINEMET IR) 25-100 MG tablet Take 1 tablet by mouth 3 (three) times daily before meals. 09/23/22  Yes Penumalli, Glenford Bayley, MD  carvedilol (COREG) 6.25 MG tablet Take 6.25 mg by mouth 2 (two) times daily with a meal.   Yes [provider]  cinacalcet (SENSIPAR) 30 MG tablet Take 1 tablet (30 mg total) by mouth 2 (two) times daily with a meal. Patient taking differently: Take 45 mg by mouth in the morning. 06/18/21  Yes Gardenia Phlegm, MD  cyclobenzaprine (FLEXERIL) 5 MG tablet Take 5 mg by mouth at bedtime.   Yes [provider]  FLUoxetine (PROZAC) 20 MG tablet Take 40 mg by mouth at bedtime.   Yes [provider]  gabapentin (NEURONTIN) 400 MG capsule Take 400 mg by mouth at bedtime.   Yes [provider]  hydrALAZINE (APRESOLINE) 50 MG tablet Take 50 mg by mouth 3 (three) times  daily.   Yes [provider]  HYDROcodone-acetaminophen (NORCO/VICODIN) 5-325 MG tablet Take 1 tablet by mouth every 6 (six) hours as needed for moderate pain. Patient taking differently: Take 1 tablet by mouth 2 (two) times daily. 08/07/21  Yes Tat, Onalee Hua, MD  lidocaine (LIDODERM) 5 % Place 2 patches onto the skin daily as needed (pain (hip & back)). (Apply hip and back )Remove & Discard patch within 12 hours or as directed by MD   Yes [provider]  Magnesium 400 MG CAPS Take 400 mg by mouth daily.   Yes [provider]  Menthol, Topical Analgesic, (BIOFREEZE EX) Apply 1 application topically 3 (three) times daily as needed (pain.).   Yes [provider]  metoCLOPramide (REGLAN) 10 MG tablet Take 10 mg by mouth in the morning and at bedtime.   Yes [provider]  mirtazapine (REMERON) 7.5 MG tablet Take 7.5 mg by mouth at bedtime.   Yes [provider]  Nutritional Supplements (,FEEDING SUPPLEMENT, PROSOURCE PLUS) liquid Take 30 mLs by mouth daily. 09/29/20  Yes Hongalgi, Maximino Greenland, MD  rosuvastatin (CRESTOR) 40 MG tablet Take 40 mg by mouth in the morning.   Yes [provider]  senna-docusate (SENOKOT-S) 8.6-50 MG tablet Take 2 tablets by mouth 2 (two) times daily.   Yes [provider]  Skin Protectants, Misc. (MINERIN CREME EX) Apply topically daily.   Yes [provider]  traZODone (DESYREL) 50 MG tablet Take 50 mg by mouth at bedtime.   Yes [provider]  UNKNOWN TO PATIENT Place 1-2 sprays into the nose daily. For Degenerative Disc Disease   Yes [provider]  polyethylene glycol (MIRALAX / GLYCOLAX) 17 g packet Take 17 g by mouth 2 (two) times daily. Patient not taking: Reported on 03/22/2023 08/07/21   Catarina Hartshorn, MD    Inpatient medications:  carbidopa-levodopa  1 tablet Oral TID AC   carvedilol  6.25 mg Oral BID WC   cinacalcet  45 mg Oral q AM   hydrALAZINE  50 mg Oral TID    insulin aspart  0-5 Units Subcutaneous  QHS   insulin aspart  0-9 Units Subcutaneous TID WC   rosuvastatin  40 mg Oral q AM   senna-docusate  1 tablet Oral QHS   traZODone  50 mg Oral QHS    Discontinued Meds:   Medications Discontinued During This Encounter  Medication Reason   furosemide (LASIX) 40 MG tablet Discontinued by provider   polyethylene glycol-electrolytes (TRILYTE) 420 g solution No longer needed (for PRN medications)   Vibegron (GEMTESA) 75 MG TABS Discontinued by provider   celecoxib (CELEBREX) 50 MG capsule Discontinued by provider   Cholecalciferol (D3-1000) 25 MCG (1000 UT) capsule Discontinued by provider   linaclotide (LINZESS) 290 MCG CAPS capsule Discontinued by provider   MULTIPLE VITAMIN-FOLIC ACID PO Discontinued by provider   pantoprazole (PROTONIX) 40 MG tablet Discontinued by provider   0.9 %  sodium chloride infusion    cefTRIAXone (ROCEPHIN) 1 g in sodium chloride 0.9 % 100 mL IVPB     Social History:  reports that she quit smoking about 53 years ago. Her smoking use included cigarettes. She has never used smokeless tobacco. She reports that she does not currently use alcohol. She reports that she does not currently use drugs.  Family History:   Family History  Problem Relation Age of Onset   Breast cancer Mother    Bone cancer Mother    Heart attack Father 25   Breast cancer Sister    Other Sister        MVA   Heart disease Brother        stents   Diabetes Brother    Colon cancer Neg Hx    Colon polyps Neg Hx     Pertinent items are noted in HPI. Weight change:   Intake/Output Summary (Last 24 hours) at 03/24/2023 0841 Last data filed at 03/24/2023 0733 Gross per 24 hour  Intake 1644.39 ml  Output 550 ml  Net 1094.39 ml   BP 136/80   Pulse 85   Temp 99.4 F (37.4 C)   Resp 20   Ht  (1.6 m)   Wt 75.5 kg   SpO2 96%   BMI 29.48 kg/m  Vitals:   03/23/23 1036 03/23/23 1417 03/23/23 1755 03/23/23 2026  BP: (!) 159/103 (!)  185/129 (!) 158/92 136/80  Pulse: 90 87  85  Resp:  16  20  Temp:  98.6 F (37 C)  99.4 F (37.4 C)  TempSrc:  Oral    SpO2:  96%    Weight:      Height:         General appearance: flat affect, NAD, slowed mentation Head: Normocephalic, without obvious abnormality, atraumatic Resp: clear to auscultation bilaterally Cardio: regular rate and rhythm, S1, S2 normal, no murmur, click, rub or gallop GI: soft, non-tender; bowel sounds normal; no masses,  no organomegaly Extremities: extremities normal, atraumatic, no cyanosis or edema  Labs: Basic Metabolic Panel: Recent Labs  Lab 03/22/23 1911 03/23/23 0420 03/24/23 0619  NA 139 137 135  K 3.8 3.1* 3.9  CL 100 103 107  CO2 25 22 21*  GLUCOSE 168* 102* 107*  BUN 31* 34* 36*  CREATININE 3.04* 3.45* 3.91*  CALCIUM 9.4 8.9 8.1*   Liver Function Tests: No results for input(s): "AST", "ALT", "ALKPHOS", "BILITOT", "PROT", "ALBUMIN" in the last 168 hours. No results for input(s): "LIPASE", "AMYLASE" in the last 168 hours. No results for input(s): "AMMONIA" in the last 168 hours. CBC: Recent Labs  Lab 03/22/23 1911 03/23/23 0420 03/24/23  0619  WBC 17.6* 19.7* 22.0*  HGB 12.0 11.9* 10.5*  HCT 35.9* 36.2 32.0*  MCV 90.9 92.3 93.0  PLT 261 245 189   PT/INR: @LABRCNTIP (inr:5) Cardiac Enzymes: ) Recent Labs  Lab 03/22/23 1911  CKTOTAL 33*   CBG: Recent Labs  Lab 03/23/23 0709 03/23/23 1114 03/23/23 1620 03/23/23 2012 03/24/23 0753  GLUCAP 152* 169* 153* 234* 111*    Iron Studies: No results for input(s): "IRON", "TIBC", "TRANSFERRIN", "FERRITIN" in the last 168 hours.  Xrays/Other Studies: CT Renal Stone Study  Result Date: 03/22/2023 CLINICAL DATA:  Abdominal/flank pain, stone suspected EXAM: CT ABDOMEN AND PELVIS WITHOUT CONTRAST TECHNIQUE: Multidetector CT imaging of the abdomen and pelvis was performed following the standard protocol without IV contrast. RADIATION DOSE REDUCTION: This exam was performed  according to the departmental dose-optimization program which includes automated exposure control, adjustment of the mA and/or kV according to patient size and/or use of iterative reconstruction technique. COMPARISON:  CT 08/01/2021 FINDINGS: Lower chest: Trace left pleural thickening. Mild dependent atelectasis. Pacemaker wires partially included. Hepatobiliary: Pneumobilia which is intermittently been present on prior exam. Cholecystectomy. No evidence of focal liver lesion. Pancreas: Parenchymal atrophy. No ductal dilatation or inflammation. Spleen: Normal in size without focal abnormality. Adrenals/Urinary Tract: No adrenal nodule. Again seen polycystic appearance of both kidneys with cysts of varying sizes. One of the cysts in the upper pole of the left kidney is now hyperdense likely representing interval hemorrhage. There is left hydronephrosis that is new from prior exam. Diffusely increased density within the left renal collecting system which has a slightly rounded appearance in the renal pelvis measuring 2.7 cm, series 2, image 39. This is favored to represent hemorrhage and blood clot. Some hyperdense material is seen within the left ureter. No evidence of ureteral stone or obstruction. There is moderate left perinephric edema, with Peri ureteric fat stranding. No right hydronephrosis. No right renal calculi. Partially distended urinary bladder. There is no bladder stone. Stomach/Bowel: No bowel obstruction or inflammation. The appendix is not definitively seen. Vascular/Lymphatic: Aortic atherosclerosis. No aneurysm. There are small left retroperitoneal lymph nodes, likely reactive. Reproductive: Status post hysterectomy. No adnexal masses. Other: No free air or free fluid. No abdominal wall hernia. Musculoskeletal: Degenerative change of the hips and lumbar spine. There are no acute or suspicious osseous abnormalities. IMPRESSION: 1. Left hydronephrosis with diffuse increased density within the left  renal collecting system and ureter, which has a rounded appearance in the renal pelvis. This is favored to represent hemorrhage and blood clot. The possibility of infection is also considered. No evidence of ureteral stone or obstruction. Recommend follow-up after course of treatment to ensure resolution, as renal collecting system mass is not entirely excluded. 2. Polycystic appearance of both kidneys with cysts of varying sizes. One of the cysts in the upper pole of the left kidney is now hyperdense, likely representing interval hemorrhage. Aortic Atherosclerosis (ICD10-I70.0). Electronically Signed   By: Narda Rutherford M.D.   On: 03/22/2023 19:25     Assessment/Plan:  AKI/CKD Stage IIIb - with obstruction of left ureter due to hemorrhage/clot.  BUN/Cr continue to climb.  There was discussion about transfer to [facility] or [facility] for possible stent vs PNT placement.  Pt needs to have either stent placement by Urology or percutaneous nephrostomy tube with IR since this has not resolved and is having worsening kidney function.   No uremic symptoms at this time.   Avoid nephrotoxic medications including NSAIDs and iodinated intravenous contrast exposure unless the latter  is absolutely indicated.   Preferred narcotic agents for pain control are hydromorphone, fentanyl, and methadone. Morphine should not be used.  Avoid Baclofen and avoid oral sodium phosphate and magnesium citrate based laxatives / bowel preps.  Continue strict Input and Output monitoring. Will monitor the patient closely with you and intervene or adjust therapy as indicated by changes in clinical status/labs  Gross hematuria - following fall with left hydronephrosis and evidence of hemorrhage/clot.  Eliquis on hold.  For Urology or IR evaluation as above. 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 Frequent falls - due to Parkinson's disease.  Agree with stopping Eliquis. ABLA - Hgb dropping.  Continue to follow and transfuse prn. DM type 2 - per  primary HTN - continue with home meds. UTI - started on rocephin and cultures pending.     Julien Nordmann Dearius Hoffmann 03/24/2023, 8:41 AM

## 2023-03-24 NOTE — TOC Progression Note (Signed)
Transition of Care [facility]) - Progression Note    Patient Details  Name: Darah Kanouse MRN: [id number] Date of Birth: 13-Feb-1951  Transition of Care [facility]) CM/SW Contact  Elliot Gault, LCSW Phone Number: 03/24/2023, 10:30 AM  Clinical Narrative:     TOC following. Spoke with Marchelle Folks at [facility] to inquire if pt eligible for [facility] PT. Per Marchelle Folks, [facility] does not cover in home services. Pt currently receiving MWF outpatient PT. If pt is assessed by their team to need additional therapy, they will work with pt's daughter to increase pt's outpatient therapy. Per Marchelle Folks, daughter can call her or the therapy team if she would like to request evaluation for additional therapy.  Contacted pt's daughter, Lawson Fiscal, to update on above.  TOC will follow.  Expected Discharge Plan: Home w Home Health Services Barriers to Discharge: Continued Medical Work up  Expected Discharge Plan and Services       Living arrangements for the past 2 months: Single Family Home                                       Social Determinants of Health (SDOH) Interventions SDOH Screenings   Food Insecurity: No Food Insecurity (03/22/2023)  Housing: Low Risk  (03/22/2023)  Transportation Needs: No Transportation Needs (03/22/2023)  Utilities: Not At Risk (03/22/2023)  Tobacco Use: Medium Risk (03/22/2023)    Readmission Risk Interventions    11/17/2020   12:32 PM  Readmission Risk Prevention Plan  Transportation Screening Complete  PCP or Specialist Appt within 3-5 Days Complete  HRI or Home Care Consult Complete  Social Work Consult for Recovery Care Planning/Counseling Complete  Palliative Care Screening Not Applicable  Medication Review Oceanographer) Complete

## 2023-03-24 NOTE — Progress Notes (Signed)
PROGRESS NOTE  Melissa Lopez NWG:[id number] DOB: 10/23/1951 DOA: 03/22/2023 PCP: Inc, [facility]  HPI/Recap of past 24 hours: Melissa Lopez  is a 72 y.o. female, with a history of CKD stage IIIB, coronary artery disease, sick sinus syndrome status post permanent pacemaker, primary hyperparathyroidism, type II diabetes mellitus with gastroparesis, hypertension, Parkinson disease, history of PE on Eliquis.  Who presented to [facility], ED with gross hematuria after multiple falls.  Associated with moderate to severe left flank pain.  Her daughter whom she lives with, noticed mild hematuria initially on Thursday after a fall.  It progressively worsened yesterday around 4 PM when she noticed gross hematuria.  Her daughter decided to bring her into the ED for further evaluation-  -In ED, workup revealed gross hematuria and AKI on CKD 3B.  Home Eliquis was held.  Creatinine on presentation greater than 3.0 with baseline creatinine 1.3.  CT renal was notable for blood clot in her renal pelvis for which urology was consulted.  No urological intervention planned at this time.  Due to worsening AKI, nephrology was consulted on 03/23/2023.  03/23/2023: The patient was seen and examined at bedside.  Her daughter was present in the room.  The patient endorses left flank pain that radiates to her left lower abdomen.  4/15: Patient continued to have left flank pain and gross hematuria.  Worsening renal function, urology was consulted as advised by nephrology as patient most likely will either need a stent placement or a nephrostomy tube to remove the obstruction which might be contributory for worsening renal function and pain.  Hemoglobin decreased to 10.5 with worsening leukocytosis at 22 today.  Creatinine continue to get worse at 3.91 today with baseline around 1.37-1.48.  Assessment/Plan: Principal Problem:   AKI (acute kidney injury) Active Problems:   Diabetes mellitus  with diabetic nephropathy   H/O cardiac pacemaker   Essential hypertension   Chronic diastolic CHF (congestive heart failure)   Hematemesis   Acute renal failure superimposed on stage 3b chronic kidney disease   Hematuria   Hydronephrosis  Worsening AKI on CKD 3B in the setting of gross hematuria, left hydronephrosis, left renal pelvis blood clot  -With AKI, creatinine 3.04 on admission, baseline 1.3 Creatinine is uptrending at 3.91 today. -Due to obstructive uropathy from hydronephrosis due to a blood clot in left renal pelvis, likely traumatic from her fall while on Eliquis for anticoagulation -Urology was again consulted and they will see her later today as she might need a stent placement. -Keep empirically on Rocephin. Continue to hold off on Eliquis. Nephrology consulted to assist with the management.   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 Ladona Ridgel   Parkinson disease with falls Continue with home medications PT OT assessment Fall precautions   Atrophic cardiomyopathy hypertrophic Cardiomyopathy -No dyspnea, no chest pain, no evidence of volume overload   Hypertension,  Resume home oral antihypertensive Continue to monitor vital signs   DM2 with gastroparesis  and orthostasis/dysautonomia Orthostasis, dysautonomia likely related to Parkinson disease -Check A1c and keep an insulin sliding scale during hospital stay    Prior CVA R Thalamic lacunar stroke -Hold aspirin due to acute gross hematuria   Chronic diastolic CHF Euvolemic on exam. Start strict I's and O's and daily weight Closely monitor volume status while on IV fluid.   DVT Prophylaxis SCDs.  Home Eliquis on hold due to acute gross hematuria.   AM Labs Ordered, also please review Full Orders    Code Status full   Likely DC to home, lives with her daughter.   Code Status: Full code  Family Communication: Discussed with daughter at bedside  Disposition Plan: Likely will discharge to home with home health services    Consultants: Urology Nephrology  Procedures: None.  Antimicrobials: Rocephin  DVT prophylaxis: SCDs  Status is: Inpatient The patient requires at least 2 midnights for further evaluation and treatment of present condition.    Objective: Vitals:   03/23/23 1417 03/23/23 1755 03/23/23 2026 03/24/23 0830  BP: (!) 185/129 (!) 158/92 136/80 (!) 150/90  Pulse: 87  85 72  Resp: 16  20 19   Temp: 98.6 F (37 C)  99.4 F (37.4 C) 98.9 F (37.2 C)  TempSrc: Oral   Oral  SpO2: 96%   97%  Weight:      Height:        Intake/Output Summary (Last 24 hours) at 03/24/2023 1450 Last data filed at 03/24/2023 0935 Gross per 24 hour  Intake 1164.39 ml  Output 1000 ml  Net 164.39 ml    Filed Weights   03/22/23 1825 03/22/23 2300  Weight: 74.8 kg 75.5 kg    Exam:  General.  Well-developed elderly lady, in no acute distress. Pulmonary.  Lungs clear bilaterally, normal respiratory effort. CV.  Regular rate and rhythm, no JVD, rub or murmur. Abdomen.  Soft, nontender, nondistended, BS positive. CNS.  Alert and oriented .  No focal neurologic deficit. Extremities.  No edema, no cyanosis, pulses intact and symmetrical. Psychiatry.  Judgment and insight appears normal.   Data Reviewed: CBC: Recent Labs  Lab 03/22/23 1911 03/23/23 0420 03/24/23 0619  WBC 17.6* 19.7* 22.0*  HGB 12.0 11.9* 10.5*  HCT  35.9* 36.2 32.0*  MCV 90.9 92.3 93.0  PLT 261 245 189    Basic Metabolic Panel: Recent Labs  Lab 03/22/23 1911 03/23/23 0420 03/24/23 0619  NA 139 137 135  K 3.8 3.1* 3.9  CL 100 103 107  CO2 25 22 21*  GLUCOSE 168* 102* 107*  BUN 31* 34* 36*  CREATININE 3.04* 3.45* 3.91*  CALCIUM 9.4 8.9 8.1*    GFR: Estimated Creatinine Clearance: 12.8 mL/min (A) (by C-G formula based on SCr of 3.91 mg/dL (H)). Liver Function Tests: No results for input(s): "AST", "ALT", "ALKPHOS", "BILITOT", "PROT", "ALBUMIN" in the last 168 hours. No results for input(s): "LIPASE", "AMYLASE" in the last 168 hours. No results for input(s): "AMMONIA" in the last 168 hours. Coagulation Profile: No results for input(s): "INR", "PROTIME" in the last 168 hours. Cardiac Enzymes: Recent Labs  Lab 03/22/23 1911  CKTOTAL 33*    BNP (last 3 results) No results for input(s): "PROBNP" in the last 8760 hours. HbA1C: Recent Labs    03/22/23 1911  HGBA1C 5.2   CBG: Recent Labs  Lab 03/23/23 1114 03/23/23 1620 03/23/23 2012 03/24/23 0753 03/24/23 1114  GLUCAP 169* 153* 234* 111* 171*    Lipid Profile: No results for input(s): "CHOL", "HDL", "LDLCALC", "TRIG", "  CHOLHDL", "LDLDIRECT" in the last 72 hours. Thyroid Function Tests: No results for input(s): "TSH", "T4TOTAL", "FREET4", "T3FREE", "THYROIDAB" in the last 72 hours. Anemia Panel: No results for input(s): "VITAMINB12", "FOLATE", "FERRITIN", "TIBC", "IRON", "RETICCTPCT" in the last 72 hours. Urine analysis:    Component Value Date/Time   COLORURINE RED (A) 03/22/2023 2030   APPEARANCEUR CLOUDY (A) 03/22/2023 2030   LABSPEC 1.009 03/22/2023 2030   PHURINE 6.0 03/22/2023 2030   GLUCOSEU NEGATIVE 03/22/2023 2030   HGBUR MODERATE (A) 03/22/2023 2030   BILIRUBINUR NEGATIVE 03/22/2023 2030   KETONESUR NEGATIVE 03/22/2023 2030   PROTEINUR 100 (A) 03/22/2023 2030   NITRITE NEGATIVE 03/22/2023 2030   LEUKOCYTESUR SMALL (A) 03/22/2023 2030    Sepsis Labs: @LABRCNTIP (procalcitonin:4,lacticidven:4)  )No results found for this or any previous visit (from the past 240 hour(s)).    Studies: No results found.  Scheduled Meds:  carbidopa-levodopa  1 tablet Oral TID AC   carvedilol  6.25 mg Oral BID WC   cinacalcet  45 mg Oral q AM   hydrALAZINE  50 mg Oral TID   insulin aspart  0-5 Units Subcutaneous QHS   insulin aspart  0-9 Units Subcutaneous TID WC   rosuvastatin  40 mg Oral q AM   senna-docusate  1 tablet Oral QHS   traZODone  50 mg Oral QHS    Continuous Infusions:  sodium chloride 75 mL/hr at 03/23/23 1152   cefTRIAXone (ROCEPHIN)  IV Stopped (03/23/23 2141)     LOS: 2 days   This record has been created using Conservation officer, historic buildings. Errors have been sought and corrected,but may not always be located. Such creation errors do not reflect on the standard of care.   Arnetha Courser, MD [facility] Pager [phone]  If 7PM-7AM, please contact night-coverage [email or web address] Password [email or web address] 03/24/2023, 2:50 PM

## 2023-03-25 ENCOUNTER — Inpatient Hospital Stay (HOSPITAL_COMMUNITY): Payer: Medicare (Managed Care)

## 2023-03-25 ENCOUNTER — Ambulatory Visit: Payer: Medicare (Managed Care) | Admitting: Cardiology

## 2023-03-25 DIAGNOSIS — I619 Nontraumatic intracerebral hemorrhage, unspecified: Secondary | ICD-10-CM

## 2023-03-25 DIAGNOSIS — N179 Acute kidney failure, unspecified: Secondary | ICD-10-CM | POA: Diagnosis not present

## 2023-03-25 DIAGNOSIS — I161 Hypertensive emergency: Secondary | ICD-10-CM | POA: Diagnosis not present

## 2023-03-25 DIAGNOSIS — I629 Nontraumatic intracranial hemorrhage, unspecified: Secondary | ICD-10-CM | POA: Diagnosis not present

## 2023-03-25 DIAGNOSIS — I6381 Other cerebral infarction due to occlusion or stenosis of small artery: Secondary | ICD-10-CM

## 2023-03-25 DIAGNOSIS — R310 Gross hematuria: Secondary | ICD-10-CM | POA: Diagnosis not present

## 2023-03-25 DIAGNOSIS — I610 Nontraumatic intracerebral hemorrhage in hemisphere, subcortical: Secondary | ICD-10-CM | POA: Diagnosis present

## 2023-03-25 LAB — MRSA NEXT GEN BY PCR, NASAL: MRSA by PCR Next Gen: NOT DETECTED

## 2023-03-25 LAB — CBC
HCT: 34 % — ABNORMAL LOW (ref 36.0–46.0)
Hemoglobin: 11.1 g/dL — ABNORMAL LOW (ref 12.0–15.0)
MCH: 30.2 pg (ref 26.0–34.0)
MCHC: 32.6 g/dL (ref 30.0–36.0)
MCV: 92.6 fL (ref 80.0–100.0)
Platelets: 205 10*3/uL (ref 150–400)
RBC: 3.67 MIL/uL — ABNORMAL LOW (ref 3.87–5.11)
RDW: 12.8 % (ref 11.5–15.5)
WBC: 22.8 10*3/uL — ABNORMAL HIGH (ref 4.0–10.5)
nRBC: 0 % (ref 0.0–0.2)

## 2023-03-25 LAB — URINE CULTURE

## 2023-03-25 LAB — BASIC METABOLIC PANEL
Anion gap: 9 (ref 5–15)
BUN: 37 mg/dL — ABNORMAL HIGH (ref 8–23)
CO2: 19 mmol/L — ABNORMAL LOW (ref 22–32)
Calcium: 8.4 mg/dL — ABNORMAL LOW (ref 8.9–10.3)
Chloride: 104 mmol/L (ref 98–111)
Creatinine, Ser: 3.55 mg/dL — ABNORMAL HIGH (ref 0.44–1.00)
GFR, Estimated: 13 mL/min — ABNORMAL LOW (ref >60)
Glucose, Bld: 132 mg/dL — ABNORMAL HIGH (ref 70–99)
Potassium: 3.5 mmol/L (ref 3.5–5.1)
Sodium: 132 mmol/L — ABNORMAL LOW (ref 135–145)

## 2023-03-25 LAB — GLUCOSE, CAPILLARY
Glucose-Capillary: 168 mg/dL — ABNORMAL HIGH (ref 70–99)
Glucose-Capillary: 187 mg/dL — ABNORMAL HIGH (ref 70–99)
Glucose-Capillary: 165 mg/dL — ABNORMAL HIGH (ref 70–99)

## 2023-03-25 MED ORDER — INSULIN ASPART 100 UNIT/ML IJ SOLN
0.0000 [IU] | INTRAMUSCULAR | Status: DC
  Administered 2023-03-25: 2 [IU] via SUBCUTANEOUS
  Administered 2023-03-26: 1 [IU] via SUBCUTANEOUS
  Administered 2023-03-26 (×2): 2 [IU] via SUBCUTANEOUS
  Administered 2023-03-26: 1 [IU] via SUBCUTANEOUS
  Administered 2023-03-26 – 2023-03-27 (×2): 3 [IU] via SUBCUTANEOUS
  Administered 2023-03-27 (×2): 1 [IU] via SUBCUTANEOUS
  Administered 2023-03-27: 2 [IU] via SUBCUTANEOUS

## 2023-03-25 MED ORDER — POLYETHYLENE GLYCOL 3350 17 G PO PACK: 17.0000 g | PACK | Freq: Every day | ORAL | Status: DC | PRN

## 2023-03-25 MED ORDER — CLEVIDIPINE BUTYRATE 0.5 MG/ML IV EMUL
0.0000 mg/h | INTRAVENOUS | Status: DC
  Administered 2023-03-25: 12 mg/h via INTRAVENOUS
  Administered 2023-03-25: 2 mg/h via INTRAVENOUS
  Administered 2023-03-26: 6 mg/h via INTRAVENOUS
  Administered 2023-03-26: 21 mg/h via INTRAVENOUS
  Administered 2023-03-26: 4 mg/h via INTRAVENOUS
  Administered 2023-03-26: 6 mg/h via INTRAVENOUS
  Administered 2023-03-27 (×2): 2 mg/h via INTRAVENOUS
  Filled 2023-03-25: qty 100
  Filled 2023-03-25 (×3): qty 50
  Filled 2023-03-25: qty 100
  Filled 2023-03-25: qty 50

## 2023-03-25 MED ORDER — DOCUSATE SODIUM 100 MG PO CAPS
100.0000 mg | ORAL_CAPSULE | Freq: Two times a day (BID) | ORAL | Status: DC | PRN
  Administered 2023-03-26: 100 mg via ORAL
  Filled 2023-03-25: qty 1

## 2023-03-25 MED ORDER — LABETALOL HCL 5 MG/ML IV SOLN
10.0000 mg | Freq: Once | INTRAVENOUS | Status: AC
  Administered 2023-03-25: 10 mg via INTRAVENOUS

## 2023-03-25 MED ORDER — CLEVIDIPINE BUTYRATE 0.5 MG/ML IV EMUL: 0.0000 mg/h | INTRAVENOUS | Status: DC

## 2023-03-25 MED ORDER — SODIUM BICARBONATE 650 MG PO TABS
650.0000 mg | ORAL_TABLET | Freq: Three times a day (TID) | ORAL | Status: DC
  Administered 2023-03-25 – 2023-03-27 (×9): 650 mg via ORAL
  Filled 2023-03-25 (×10): qty 1

## 2023-03-25 MED ORDER — POLYETHYLENE GLYCOL 3350 17 G PO PACK
17.0000 g | PACK | Freq: Every day | ORAL | Status: DC
  Administered 2023-03-25 – 2023-04-01 (×7): 17 g via ORAL
  Filled 2023-03-25 (×8): qty 1

## 2023-03-25 MED ORDER — CINACALCET HCL 30 MG PO TABS
30.0000 mg | ORAL_TABLET | Freq: Every day | ORAL | Status: AC
  Administered 2023-03-25: 30 mg via ORAL

## 2023-03-25 MED ORDER — HYDRALAZINE HCL 20 MG/ML IJ SOLN
10.0000 mg | Freq: Four times a day (QID) | INTRAMUSCULAR | Status: DC | PRN
  Administered 2023-03-26 – 2023-03-28 (×4): 20 mg via INTRAVENOUS
  Filled 2023-03-25 (×4): qty 1

## 2023-03-25 MED ORDER — CHLORHEXIDINE GLUCONATE CLOTH 2 % EX PADS
6.0000 | MEDICATED_PAD | Freq: Every day | CUTANEOUS | Status: DC
  Administered 2023-03-25 – 2023-04-01 (×8): 6 via TOPICAL

## 2023-03-25 NOTE — Progress Notes (Addendum)
PROGRESS NOTE  Melissa Lopez VVZ:[id number] DOB: Jul 18, 1951 DOA: 03/22/2023 PCP: [facility]  HPI/Recap of past 24 hours: Melissa Lopez  is a 72 y.o. female, with a history of CKD stage IIIB, coronary artery disease, sick sinus syndrome status post permanent pacemaker, primary hyperparathyroidism, type II diabetes mellitus with gastroparesis, hypertension, Parkinson disease, history of PE on Eliquis.  Who presented to [facility], ED with gross hematuria after multiple falls.  Associated with moderate to severe left flank pain.  Her daughter whom she lives with, noticed mild hematuria initially on Thursday after a fall.  It progressively worsened yesterday around 4 PM when she noticed gross hematuria.  Her daughter decided to bring her into the ED for further evaluation-  -In ED, workup revealed gross hematuria and AKI on CKD 3B.  Home Eliquis was held.  Creatinine on presentation greater than 3.0 with baseline creatinine 1.3.  CT renal was notable for blood clot in her renal pelvis for which urology was consulted.  No urological intervention planned at this time.  Due to worsening AKI, nephrology was consulted on 03/23/2023.  03/23/2023: The patient was seen and examined at bedside.  Her daughter was present in the room.  The patient endorses left flank pain that radiates to her left lower abdomen.  4/15: Patient continued to have left flank pain and gross hematuria.  Worsening renal function, urology was consulted as advised by nephrology as patient most likely will either need a stent placement or a nephrostomy tube to remove the obstruction which might be contributory for worsening renal function and pain.  Hemoglobin decreased to 10.5 with worsening leukocytosis at 22 today.  Creatinine continue to get worse at 3.91 today with baseline around 1.37-1.48.  4/16: Improving hematuria, hemoglobin and renal function today.  Complaining of left wrist pain and  edema-imaging ordered.  Urology would like to do conservative management and there is no need for any intervention-anticipating resolution of clot within next 48 to 72 hours. PT/OT are now recommending SNF-TOC consult placed.  Addendum.  Patient was noted to have new onset left-sided weakness and drifting while working with PT.  Repeat bedside exam with no facial droop, significant weakness of left upper extremity and left lower extremity weaker than right but she was not moving her both extremities properly stating that it is hurting.  Code stroke was activated and neurology also evaluated her.  Stat CT head with a small acute right thalamic hemorrhage with mild surrounding edema but no significant mass effect.  Repeat blood pressure elevated.  Patient was transferred to ICU and was placed on Cleviprex infusion.  Bed request was made for transfer to [facility] as advised by neurology.  Patient will need a very aggressive blood pressure control and most likely will not be able to take Eliquis until cleared from neurology.  Assessment/Plan: Principal Problem:   AKI (acute kidney injury) Active Problems:   Diabetes mellitus with diabetic nephropathy   H/O cardiac pacemaker   Essential hypertension   Chronic diastolic CHF (congestive heart failure)   Hematemesis   Acute renal failure superimposed on stage 3b chronic kidney disease   Hematuria   Hydronephrosis  Acute right thalamic hemorrhage. Code stroke was activated when PT was concern about left-sided drift and weakness.  Patient was mostly complaining of left wrist pain and also unable to move both lower extremities properly stating due to pain. Stat CT head with acute right thalamic hemorrhage. Patient was transferred to ICU and  placed on Cleviprex for better control of hypertension. -Bed request was made for [facility] transfer-patient will be going to ICU.  Worsening AKI on CKD 3B in the setting of gross hematuria, left hydronephrosis,  left renal pelvis blood clot  -With AKI, creatinine 3.04 on admission, baseline 1.3 Creatinine with some improvement today, at 355 -Due to obstructive uropathy from hydronephrosis due to a blood clot in left renal pelvis, likely traumatic from her fall while on Eliquis for anticoagulation -Urology was consulted and they were recommending conservative management as anticipating clot resolution in next 48-72 hours.  Urine culture results are still pending -Keep empirically on Rocephin. Continue to hold off on Eliquis. Nephrology consulted to assist with the management.   Acute gross hematuria, post fall with left hydronephrosis and left flank pain while on home Eliquis Fall incident occurred 4 days ago Continue IV fluid hydration  Polycystic appearance of both kidneys with cysts on different sizes, seen on CT scan Concern for hemorrhage of the cysts Continue to hold off home Eliquis  Complicated UTI with acute gross hematuria and left hydronephrosis, POA UA positive for pyuria Obtain urine culture-pending results Increase dose of Rocephin to 2 g daily Follow urine culture for ID and sensitivities  Left wrist pain.  Started yesterday with edema and mild erythema, restricted movements with good pulses. 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 Ladona Ridgel   Parkinson disease with falls Continue with home medications PT OT assessment-recommending rehab Fall precautions   Atrophic cardiomyopathy hypertrophic Cardiomyopathy -No dyspnea, no chest pain, no evidence of volume overload   Hypertension,  Resume home oral antihypertensive Continue to monitor vital signs   DM2 with gastroparesis and orthostasis/dysautonomia Orthostasis, dysautonomia likely related to Parkinson disease -Check A1c and keep an insulin sliding scale during hospital stay   Prior CVA R Thalamic lacunar stroke -Hold aspirin due to acute gross  hematuria   Chronic diastolic CHF Euvolemic on exam. Start strict I's and O's and daily weight Closely monitor volume status while on IV fluid.   DVT Prophylaxis SCDs.  Home Eliquis on hold due to acute gross hematuria.   AM Labs Ordered, also please review Full Orders    Code Status full   Likely DC to home, lives with her daughter.   Code Status: Full code  Family Communication: Discussed with daughter on phone.  Disposition Plan:  SNF  Consultants: Urology Nephrology  Procedures: None.  Antimicrobials: Rocephin  DVT prophylaxis: SCDs  Status is: Inpatient The patient requires at least 2 midnights for further evaluation and treatment of present condition.    Objective: Vitals:   03/24/23 0830 03/24/23 2033 03/25/23 0319 03/25/23 0947  BP: (!) 150/90 (!) 150/83 (!) 190/83 (!) 155/101  Pulse: 72 70 82   Resp: Temp: 98.9 F (37.2 C) 99.6 F (37.6 C) 99.8 F (37.7 C)   TempSrc: Oral     SpO2: 97% 98% 96%   Weight:      Height:        Intake/Output Summary (Last 24 hours) at 03/25/2023 1005 Last data filed at 03/25/2023 4098 Gross per 24 hour  Intake 100 ml  Output 2600 ml  Net -2500 ml    Filed Weights   03/22/23 1825 03/22/23 2300  Weight: 74.8 kg 75.5 kg    Exam:  General.  Frail elderly lady, In no acute distress. Pulmonary.  Lungs clear bilaterally, normal respiratory effort. CV.  Regular rate  and rhythm, no JVD, rub or murmur. Abdomen.  Soft, nontender, nondistended, BS positive. CNS.  Alert and oriented .  No focal neurologic deficit. Extremities.  No edema, no cyanosis, pulses intact and symmetrical. Psychiatry.  Appears to have some cognitive impairment.  Data Reviewed: CBC: Recent Labs  Lab 03/22/23 1911 03/23/23 0420 03/24/23 0619 03/25/23 0446  WBC 17.6* 19.7* 22.0* 22.8*  HGB 12.0 11.9* 10.5* 11.1*  HCT 35.9* 36.2 32.0* 34.0*  MCV 90.9 92.3 93.0 92.6  PLT 261 245 189 205    Basic Metabolic Panel: Recent  Labs  Lab 03/22/23 1911 03/23/23 0420 03/24/23 0619 03/25/23 0446  NA 139 137 135 132*  K 3.8 3.1* 3.9 3.5  CL 100 103 107 104  CO2 25 22 21* 19*  GLUCOSE 168* 102* 107* 132*  BUN 31* 34* 36* 37*  CREATININE 3.04* 3.45* 3.91* 3.55*  CALCIUM 9.4 8.9 8.1* 8.4*    GFR: Estimated Creatinine Clearance: 14.1 mL/min (A) (by C-G formula based on SCr of 3.55 mg/dL (H)). Liver Function Tests: No results for input(s): "AST", "ALT", "ALKPHOS", "BILITOT", "PROT", "ALBUMIN" in the last 168 hours. No results for input(s): "LIPASE", "AMYLASE" in the last 168 hours. No results for input(s): "AMMONIA" in the last 168 hours. Coagulation Profile: No results for input(s): "INR", "PROTIME" in the last 168 hours. Cardiac Enzymes: Recent Labs  Lab 03/22/23 1911  CKTOTAL 33*    BNP (last 3 results) No results for input(s): "PROBNP" in the last 8760 hours. HbA1C: Recent Labs    03/22/23 1911  HGBA1C 5.2    CBG: Recent Labs  Lab 03/24/23 0753 03/24/23 1114 03/24/23 1605 03/24/23 2018 03/25/23 0728  GLUCAP 111* 171* 174* 152* 168*    Lipid Profile: No results for input(s): "CHOL", "HDL", "LDLCALC", "TRIG", "CHOLHDL", "LDLDIRECT" in the last 72 hours. Thyroid Function Tests: No results for input(s): "TSH", "T4TOTAL", "FREET4", "T3FREE", "THYROIDAB" in the last 72 hours. Anemia Panel: No results for input(s): "VITAMINB12", "FOLATE", "FERRITIN", "TIBC", "IRON", "RETICCTPCT" in the last 72 hours. Urine analysis:    Component Value Date/Time   COLORURINE RED (A) 03/22/2023 2030   APPEARANCEUR CLOUDY (A) 03/22/2023 2030   LABSPEC 1.009 03/22/2023 2030   PHURINE 6.0 03/22/2023 2030   GLUCOSEU NEGATIVE 03/22/2023 2030   HGBUR MODERATE (A) 03/22/2023 2030   BILIRUBINUR NEGATIVE 03/22/2023 2030   KETONESUR NEGATIVE 03/22/2023 2030   PROTEINUR 100 (A) 03/22/2023 2030   NITRITE NEGATIVE 03/22/2023 2030   LEUKOCYTESUR SMALL (A) 03/22/2023 2030   Sepsis  Labs: (procalcitonin:4,lacticidven:4)  ) Recent Results (from the past 240 hour(s))  Urine Culture (for pregnant, neutropenic or urologic patients or patients with an indwelling urinary catheter)     Status: None (Preliminary result)   Collection Time: 03/22/23  8:40 PM   Specimen: Urine, Clean Catch  Result Value Ref Range Status   Specimen Description   Final    URINE, CLEAN CATCH Performed at [facility], [address] [place], [place] [address]    Special Requests   Final    NONE Performed at [facility], [address] [place], [place] [address]    Culture   Final    CULTURE REINCUBATED FOR BETTER GROWTH Performed at [facility], [address] [place], [place] [address]    Report Status PENDING  Incomplete      Studies: No results found.  Scheduled Meds:  carbidopa-levodopa  1 tablet Oral TID AC   carvedilol  6.25 mg Oral BID WC   cinacalcet  45 mg Oral q AM   hydrALAZINE  50 mg Oral TID   insulin aspart  0-5 Units Subcutaneous QHS   insulin aspart  0-9 Units Subcutaneous TID WC   rosuvastatin  40 mg Oral q AM   senna-docusate  1 tablet Oral QHS   sodium bicarbonate  650 mg Oral TID   traZODone  50 mg Oral QHS    Continuous Infusions:  sodium chloride 75 mL/hr at 03/23/23 1152   cefTRIAXone (ROCEPHIN)  IV Stopped (03/24/23 2124)     LOS: 3 days   This record has been created using Conservation officer, historic buildings. Errors have been sought and corrected,but may not always be located. Such creation errors do not reflect on the standard of care.   Arnetha Courser, MD [facility] Pager [phone]  If 7PM-7AM, please contact night-coverage [email or web address] Password [email or web address] 03/25/2023, 10:05 AM

## 2023-03-25 NOTE — TOC Progression Note (Signed)
Transition of Care [facility]) - Progression Note    Patient Details  Name: Melissa Lopez MRN: [id number] Date of Birth: 1951-01-31  Transition of [facility]) CM/SW Contact  Elliot Gault, LCSW Phone Number: 03/25/2023, 11:53 AM  Clinical Narrative:     TOC following. Pt with new CVA and plan for transfer to [facility]. PT indicating pt will now need SNF rehab at dc. Timeframe for dc not yet known.  Update provided to pt's [facility] SW, Marchelle Folks. [facility] TOC will follow for pt's dc planning needs.  Expected Discharge Plan: Home w Home Health Services Barriers to Discharge: Continued Medical Work up  Expected Discharge Plan and Services       Living arrangements for the past 2 months: Single Family Home                                       Social Determinants of Health (SDOH) Interventions SDOH Screenings   Food Insecurity: No Food Insecurity (03/22/2023)  Housing: Low Risk  (03/22/2023)  Transportation Needs: No Transportation Needs (03/22/2023)  Utilities: Not At Risk (03/22/2023)  Tobacco Use: Medium Risk (03/22/2023)    Readmission Risk Interventions    11/17/2020   12:32 PM  Readmission Risk Prevention Plan  Transportation Screening Complete  PCP or Specialist Appt within 3-5 Days Complete  HRI or Home Care Consult Complete  Social Work Consult for Recovery Care Planning/Counseling Complete  Palliative Care Screening Not Applicable  Medication Review [employer]) Complete

## 2023-03-25 NOTE — Progress Notes (Signed)
3664: notified by OT/PT that pt has noted significant decline in physical ability since yesterday, they state she is unable to complete therapy today. This nurse into pt's room to evaluate, pt with noted inability to use left arm but otherwise no noted deficits or other change from earlier assessment.  0920: MD Amin into room on morning rounds, updated on OT/PT concerns and current assessment, MD placed orders for wrist xray and TOC consult.  Xray tech in to complete xray (xray over read completed at [phone]) PT updated on MD's plan of care, advised he would return to pt's room for revaluation. PT unsuccessful in completing therapy visit d/t pt with noted left sided weakness. As PT finished eval, Nephrologist in for eval and also noted significant left sided weakness. Nephrologist contacted MD Nelson Chimes stating concern for stroke like symptoms. MD Nelson Chimes returned to bedside for revaluation of pt and symptoms. MD Nelson Chimes request nursing staff to call Code Stroke. CT ordered and pt taken to radiology via bed by nursing staff. Neuro assessment completed bedside in CT with [facility], RN and neurologist via tele neuro cart. CT completed, pt transferred back to room 329. ICU transfer order received. Confirmed bed availability with ICU staff. Pt transferred to ICU and bedside handoff given to Jama Flavors, RN. Pt's daughter called and advised of change in pt status and transferred to ICU. Daughter states understanding.

## 2023-03-25 NOTE — Progress Notes (Signed)
I personally saw the patient and performed a substantive portion of this encounter, including a complete performance of at least one of the key components (MDM, Hx and/or Exam), in conjunction with the Advanced Practice Provider.   72 year old woman was admitted for hematuria after a fall.  Found to have AKI, Eliquis was held, seen by urology-presumed etiology was ruptured renal cyst since she has a history of polycystic kidney disease.  Treated with empiric antibiotics.  Left hydronephrosis felt to be related to clot in the renal pelvis. On 4/16 while working with PT she was noted to have left-sided weakness and code stroke was called, head CT showed small right thalamic hemorrhage, suspected hypertensive etiology.  She was placed on Cleviprex and transferred to [facility] ICU.  PMH - CKD stage III, coronary artery disease, sick sinus syndrome status post permanent pacemaker, primary hyperparathyroidism, diabetes mellitus with gastroparesis, and hypertension , and  Parkinson disease   On exam -lying supine in bed, no distress, calm, able to speak in short sentences, left hemiparesis, flicker movement of left hand, left lower extremity 2/5 right leg weakness 3/5, right upper extremity 4/5 Left wrist swollen, slight erythema, tender to touch  Labs show hyponatremia, mild hypokalemia, BUN/creatinine 37/3.5 slight decrease, stable leukocytosis and anemia  Impression/plan Right thalamic hypertensive hemorrhage -Per neurology Neurochecks Using Cleviprex for hypertensive emergency with goal systolic 1 30-1 50. Continue Coreg and hydralazine  AKI on CKD stage IIIb -last creatinine 1.4 07/2021 , has PCKD -No intervention recommended by urology for left hydronephrosis expect to resolve. Avoid nephrotoxins -Empiric treatment with ceftriaxone for pyelo although only 6-10 white cells noted on UA  Parkinson's with frequent falls -DC Eliquis Continue Sinemet  Left wrist swelling with x-ray suggestive of  pseudogout, likely related to primary hypothyroidism -cannot use NSAIDs, hold off steroids, use pain meds and ice packs for now  My independent critical care time was 45 minutes  Corbyn Wildey V. Vassie Loll MD

## 2023-03-25 NOTE — Progress Notes (Signed)
Telestroke RN Note: Elert 1034  At time of elert there was a nurse at bedside who stated that the pt was already in CT with the primary RN who knew why the code stroke was called.  1035 Dr. Amada Jupiter paged Instructed bedside team to take cart to CT Dr. Amada Jupiter on camera at 1039 as cart arrived in CT with the pt.  Per [facility] LPN recognition of symptoms [id number], Inpt Md at bedside 0915, L hand weakness noticed yesterday by daughter. LNW unknown.

## 2023-03-25 NOTE — Consult Note (Signed)
Urology Consult  Referring physician: Dr. Margo Aye Reason for referral: Gross hematuria and left hydronephrosis  Chief Complaint: Hematuria  History of Present Illness: Ms Melissa Lopez is a 72yo with a history polycystic kidney disease, DMII, CAD and afib on eliquis who was admitted with hematuria, AKI and weakness. She fell 3 days ago and then developed gross hematuria on Saturday. She had associated abdominal pain which was sharp, intermittent, moderate and nonraditing. No history of gross hematuria in the past. She denies nay significant LUTS. No dysuria currently. No history of recurrent UTI. Ct on admission showed clot in the bladder and left renal pelvis. She has mild hydronephrosis associated with the clot., NO other complaints today  Past Medical History:  Diagnosis Date   CKD (chronic kidney disease) stage 3, GFR 30-59 ml/min 10/31/2018   - per patient report   Coronary artery disease    per patient report - 2 caths with non-obstructive CAD   Essential hypertension    Frequent falls    H/O cardiac pacemaker 10/31/2018   implanted 05/2010   Sick sinus syndrome    Type 2 diabetes mellitus with complication, with long-term current use of insulin 10/31/2018   with diabetic nephropathy   Past Surgical History:  Procedure Laterality Date   BIOPSY  09/28/2020   Procedure: BIOPSY;  Surgeon: Bernette Redbird, MD;  Location: [facility] ENDOSCOPY;  Service: Endoscopy;;   BIOPSY  08/03/2021   Procedure: BIOPSY;  Surgeon: Corbin Ade, MD;  Location: [facility];  Service: Endoscopy;;   CARDIAC PACEMAKER PLACEMENT  2008   in [place] ([facility]) for sick sinus syndrome   CHOLECYSTECTOMY     CORONARY/GRAFT ACUTE MI REVASCULARIZATION N/A 11/09/2020   Procedure: CORONARY/GRAFT ACUTE MI REVASCULARIZATION;  Surgeon: Marykay Lex, MD;  Location: [facility] INVASIVE CV LAB;  Service: Cardiovascular;  Laterality: N/A;   ESOPHAGOGASTRODUODENOSCOPY N/A 09/28/2020   Procedure: ESOPHAGOGASTRODUODENOSCOPY (EGD);   Surgeon: Bernette Redbird, MD;  Location: [facility] ENDOSCOPY;  Service: Endoscopy;  Laterality: N/A;   ESOPHAGOGASTRODUODENOSCOPY (EGD) WITH PROPOFOL N/A 11/10/2018   Procedure: ESOPHAGOGASTRODUODENOSCOPY (EGD) WITH PROPOFOL;  Surgeon: Charlott Rakes, MD;  Location: [facility] ENDOSCOPY;  Service: Endoscopy;  Laterality: N/A;   ESOPHAGOGASTRODUODENOSCOPY (EGD) WITH PROPOFOL N/A 08/03/2021   severe reflux esophagitis, bile gastritis, felt to have delayed gastric emptying. Pathology from esophageal biopsies with fibrinopurulent exudate and granulation tissue consistent with ulcer base   LEFT HEART CATH AND CORONARY ANGIOGRAPHY N/A 10/31/2018   Procedure: LEFT HEART CATH AND CORONARY ANGIOGRAPHY;  Surgeon: Marykay Lex, MD;  Location: [facility] INVASIVE CV LAB;  Service: Cardiovascular;  Laterality: N/A;   PACEMAKER GENERATOR CHANGE  09/2018   in [place] ([facility])   REPLACEMENT TOTAL KNEE BILATERAL      Medications: I have reviewed the patient's current medications. Allergies:  Allergies  Allergen Reactions   Contrast Media [Iodinated Contrast Media] Swelling    Contrast Dye - the one made out of shellfish, gives a really bad reaction - swelling   Oxycodone-Acetaminophen Anaphylaxis   Xarelto  [Rivaroxaban] Swelling   Oxycodone Other (See Comments)    Statused by Person:  EAST, CAMELIA(CE) on [phone]    Family History  Problem Relation Age of Onset   Breast cancer Mother    Bone cancer Mother    Heart attack Father 61   Breast cancer Sister    Other Sister        MVA   Heart disease Brother        stents   Diabetes Brother  Colon cancer Neg Hx    Colon polyps Neg Hx    Social History:  reports that she quit smoking about 53 years ago. Her smoking use included cigarettes. She has never used smokeless tobacco. She reports that she does not currently use alcohol. She reports that she does not currently use drugs.  Review of Systems  Genitourinary:  Positive for hematuria.  All other  systems reviewed and are negative.   Physical Exam:  Vital signs in last 24 hours: Temp:  [98.9 F (37.2 C)-99.8 F (37.7 C)] 99.8 F (37.7 C) (04/16 0319) Pulse Rate:  [70-82] 82 (04/16 0319) Resp:  [18-20] 20 (04/16 0319) BP: (150-190)/(83-90) 190/83 (04/16 0319) SpO2:  [96 %-98 %] 96 % (04/16 0319) Physical Exam Vitals reviewed.  Constitutional:      Appearance: Normal appearance.  HENT:     Head: Normocephalic and atraumatic.     Nose: Nose normal. No congestion.     Mouth/Throat:     Mouth: Mucous membranes are moist.  Eyes:     Extraocular Movements: Extraocular movements intact.     Pupils: Pupils are equal, round, and reactive to light.  Cardiovascular:     Rate and Rhythm: Normal rate and regular rhythm.  Pulmonary:     Effort: Pulmonary effort is normal. No respiratory distress.  Abdominal:     General: Abdomen is flat. There is no distension.  Musculoskeletal:        General: No swelling.     Cervical back: Normal range of motion and neck supple.  Skin:    General: Skin is warm and dry.  Neurological:     General: No focal deficit present.     Mental Status: She is alert and oriented to person, place, and time.  Psychiatric:        Mood and Affect: Mood normal.        Behavior: Behavior normal.        Thought Content: Thought content normal.        Judgment: Judgment normal.     Laboratory Data:  Results for orders placed or performed during the hospital encounter of 03/22/23 (from the past 72 hour(s))  CBC     Status: Abnormal   Collection Time: 03/22/23  7:11 PM  Result Value Ref [place]   WBC 17.6 (H) 4.0 - 10.5 K/uL   RBC 3.95 3.87 - 5.11 MIL/uL   Hemoglobin 12.0 12.0 - 15.0 g/dL   HCT 16.1 (L) 09.6 - 04.5 %   MCV 90.9 80.0 - 100.0 fL   MCH 30.4 26.0 - 34.0 pg   MCHC 33.4 30.0 - 36.0 g/dL   RDW 40.9 81.1 - 91.4 %   Platelets 261 150 - 400 K/uL   nRBC 0.0 0.0 - 0.2 %    Comment: Performed at [facility], [address], [place], [place]  [address]  Basic metabolic panel     Status: Abnormal   Collection Time: 03/22/23  7:11 PM  Result Value Ref [place]   Sodium 139 135 - 145 mmol/L   Potassium 3.8 3.5 - 5.1 mmol/L   Chloride 100 98 - 111 mmol/L   CO2 25 22 - 32 mmol/L   Glucose, Bld 168 (H) 70 - 99 mg/dL    Comment: Glucose reference range applies only to samples taken after fasting for at least 8 hours.   BUN 31 (H) 8 - 23 mg/dL   Creatinine, Ser 6.21 (H) 0.44 - 1.00 mg/dL   Calcium 9.4 8.9 -  10.3 mg/dL   GFR, Estimated 16 (L) >60 mL/min    Comment: (NOTE) Calculated using the CKD-EPI Creatinine Equation (2021)    Anion gap 14 5 - 15    Comment: Performed at [facility], [address] [place], [place] [address]  CK     Status: Abnormal   Collection Time: 03/22/23  7:11 PM  Result Value Ref [place]   Total CK 33 (L) 38 - 234 U/L    Comment: Performed at [facility], [address], [place], [place] [address]  Hemoglobin A1c     Status: None   Collection Time: 03/22/23  7:11 PM  Result Value Ref [place]   Hgb A1c MFr Bld 5.2 4.8 - 5.6 %    Comment: (NOTE) Pre diabetes:          5.7%-6.4%  Diabetes:              >6.4%  Glycemic control for   <7.0% adults with diabetes    Mean Plasma Glucose 102.54 mg/dL    Comment: Performed at [facility], [address], [place], [place] [address]  Urinalysis, Routine w reflex microscopic -Urine, Clean Catch     Status: Abnormal   Collection Time: 03/22/23  8:30 PM  Result Value Ref [place]   Color, Urine RED (A) YELLOW    Comment: BIOCHEMICALS MAY BE AFFECTED BY COLOR   APPearance CLOUDY (A) CLEAR   Specific Gravity, Urine 1.009 1.005 - 1.030   pH 6.0 5.0 - 8.0   Glucose, UA NEGATIVE NEGATIVE mg/dL   Hgb urine dipstick MODERATE (A) NEGATIVE   Bilirubin Urine NEGATIVE NEGATIVE   Ketones, ur NEGATIVE NEGATIVE mg/dL   Protein, ur 914 (A) NEGATIVE mg/dL   Nitrite NEGATIVE NEGATIVE   Leukocytes,Ua SMALL (A) NEGATIVE   RBC / HPF >50 0 - 5 RBC/hpf   WBC, UA 6-10 0 - 5  WBC/hpf   Bacteria, UA FEW (A) NONE SEEN   Squamous Epithelial / HPF 0-5 0 - 5 /HPF   Hyaline Casts, UA PRESENT     Comment: Performed at [facility], [address], [place], [place] [address]  Glucose, capillary     Status: Abnormal   Collection Time: 03/22/23 11:14 PM  Result Value Ref [place]   Glucose-Capillary 222 (H) 70 - 99 mg/dL    Comment: Glucose reference range applies only to samples taken after fasting for at least 8 hours.  Basic metabolic panel     Status: Abnormal   Collection Time: 03/23/23  4:20 AM  Result Value Ref [place]   Sodium 137 135 - 145 mmol/L   Potassium 3.1 (L) 3.5 - 5.1 mmol/L   Chloride 103 98 - 111 mmol/L   CO2 22 22 - 32 mmol/L   Glucose, Bld 102 (H) 70 - 99 mg/dL    Comment: Glucose reference range applies only to samples taken after fasting for at least 8 hours.   BUN 34 (H) 8 - 23 mg/dL   Creatinine, Ser 6.21 (H) 0.44 - 1.00 mg/dL   Calcium 8.9 8.9 - 30.8 mg/dL   GFR, Estimated 14 (L) >60 mL/min    Comment: (NOTE) Calculated using the CKD-EPI Creatinine Equation (2021)    Anion gap 12 5 - 15    Comment: Performed at [facility], [address], [place], [place] [address]  CBC     Status: Abnormal   Collection Time: 03/23/23  4:20 AM  Result Value Ref [place]   WBC 19.7 (H) 4.0 -  10.5 K/uL   RBC 3.92 3.87 - 5.11 MIL/uL   Hemoglobin 11.9 (L) 12.0 - 15.0 g/dL   HCT 16.1 09.6 - 04.5 %   MCV 92.3 80.0 - 100.0 fL   MCH 30.4 26.0 - 34.0 pg   MCHC 32.9 30.0 - 36.0 g/dL   RDW 40.9 81.1 - 91.4 %   Platelets 245 150 - 400 K/uL   nRBC 0.0 0.0 - 0.2 %    Comment: Performed at [facility], [address], [place], [place] [address]  Glucose, capillary     Status: Abnormal   Collection Time: 03/23/23  7:09 AM  Result Value Ref [place]   Glucose-Capillary 152 (H) 70 - 99 mg/dL    Comment: Glucose reference range applies only to samples taken after fasting for at least 8 hours.   Comment 1 QC Due   Glucose, capillary     Status: Abnormal   Collection  Time: 03/23/23 11:14 AM  Result Value Ref [place]   Glucose-Capillary 169 (H) 70 - 99 mg/dL    Comment: Glucose reference range applies only to samples taken after fasting for at least 8 hours.  Glucose, capillary     Status: Abnormal   Collection Time: 03/23/23  4:20 PM  Result Value Ref [place]   Glucose-Capillary 153 (H) 70 - 99 mg/dL    Comment: Glucose reference range applies only to samples taken after fasting for at least 8 hours.   Comment 1 Notify RN    Comment 2 Document in Chart   Glucose, capillary     Status: Abnormal   Collection Time: 03/23/23  8:12 PM  Result Value Ref [place]   Glucose-Capillary 234 (H) 70 - 99 mg/dL    Comment: Glucose reference range applies only to samples taken after fasting for at least 8 hours.  CBC     Status: Abnormal   Collection Time: 03/24/23  6:19 AM  Result Value Ref [place]   WBC 22.0 (H) 4.0 - 10.5 K/uL   RBC 3.44 (L) 3.87 - 5.11 MIL/uL   Hemoglobin 10.5 (L) 12.0 - 15.0 g/dL   HCT 62.1 (L) 30.8 - 65.7 %   MCV 93.0 80.0 - 100.0 fL   MCH 30.5 26.0 - 34.0 pg   MCHC 32.8 30.0 - 36.0 g/dL   RDW 84.6 96.2 - 95.2 %   Platelets 189 150 - 400 K/uL   nRBC 0.0 0.0 - 0.2 %    Comment: Performed at [facility], [address] [place], [place] [address]  Basic metabolic panel     Status: Abnormal   Collection Time: 03/24/23  6:19 AM  Result Value Ref [place]   Sodium 135 135 - 145 mmol/L   Potassium 3.9 3.5 - 5.1 mmol/L   Chloride 107 98 - 111 mmol/L   CO2 21 (L) 22 - 32 mmol/L   Glucose, Bld 107 (H) 70 - 99 mg/dL    Comment: Glucose reference range applies only to samples taken after fasting for at least 8 hours.   BUN 36 (H) 8 - 23 mg/dL   Creatinine, Ser 4.40 (H) 0.44 - 1.00 mg/dL   Calcium 8.1 (L) 8.9 - 10.3 mg/dL   GFR, Estimated 12 (L) >60 mL/min    Comment: (NOTE) Calculated using the CKD-EPI Creatinine Equation (2021)    Anion gap 7 5 - 15    Comment: Performed at [facility], [address], [place], [place] [address]  Glucose,  capillary     Status: Abnormal  Collection Time: 03/24/23  7:53 AM  Result Value Ref [place]   Glucose-Capillary 111 (H) 70 - 99 mg/dL    Comment: Glucose reference range applies only to samples taken after fasting for at least 8 hours.  Glucose, capillary     Status: Abnormal   Collection Time: 03/24/23 11:14 AM  Result Value Ref [place]   Glucose-Capillary 171 (H) 70 - 99 mg/dL    Comment: Glucose reference range applies only to samples taken after fasting for at least 8 hours.  Glucose, capillary     Status: Abnormal   Collection Time: 03/24/23  4:05 PM  Result Value Ref [place]   Glucose-Capillary 174 (H) 70 - 99 mg/dL    Comment: Glucose reference range applies only to samples taken after fasting for at least 8 hours.  Glucose, capillary     Status: Abnormal   Collection Time: 03/24/23  8:18 PM  Result Value Ref [place]   Glucose-Capillary 152 (H) 70 - 99 mg/dL    Comment: Glucose reference range applies only to samples taken after fasting for at least 8 hours.  CBC     Status: Abnormal   Collection Time: 03/25/23  4:46 AM  Result Value Ref [place]   WBC 22.8 (H) 4.0 - 10.5 K/uL   RBC 3.67 (L) 3.87 - 5.11 MIL/uL   Hemoglobin 11.1 (L) 12.0 - 15.0 g/dL   HCT 40.9 (L) 81.1 - 91.4 %   MCV 92.6 80.0 - 100.0 fL   MCH 30.2 26.0 - 34.0 pg   MCHC 32.6 30.0 - 36.0 g/dL   RDW 78.2 95.6 - 21.3 %   Platelets 205 150 - 400 K/uL   nRBC 0.0 0.0 - 0.2 %    Comment: Performed at [facility], [address], [place], [place] [address]  Basic metabolic panel     Status: Abnormal   Collection Time: 03/25/23  4:46 AM  Result Value Ref [place]   Sodium 132 (L) 135 - 145 mmol/L   Potassium 3.5 3.5 - 5.1 mmol/L   Chloride 104 98 - 111 mmol/L   CO2 19 (L) 22 - 32 mmol/L   Glucose, Bld 132 (H) 70 - 99 mg/dL    Comment: Glucose reference range applies only to samples taken after fasting for at least 8 hours.   BUN 37 (H) 8 - 23 mg/dL   Creatinine, Ser 8.46 (H) 0.44 - 1.00 mg/dL   Calcium 8.4 (L) 8.9 -  10.3 mg/dL   GFR, Estimated 13 (L) >60 mL/min    Comment: (NOTE) Calculated using the CKD-EPI Creatinine Equation (2021)    Anion gap 9 5 - 15    Comment: Performed at [facility], [address] [place], [place] [address]  Glucose, capillary     Status: Abnormal   Collection Time: 03/25/23  7:28 AM  Result Value Ref [place]   Glucose-Capillary 168 (H) 70 - 99 mg/dL    Comment: Glucose reference range applies only to samples taken after fasting for at least 8 hours.   No results found for this or any previous visit (from the past 240 hour(s)). Creatinine: Recent Labs    03/22/23 1911 03/23/23 0420 03/24/23 0619 03/25/23 0446  CREATININE 3.04* 3.45* 3.91* 3.55*   Baseline Creatinine: 1.5  Impression/Assessment:  71yo with gross hematuria and left hydronephrosis  Plan:  Gross hematuria: We discussed the natural history of gross hematuria and the various causes of gross hematuria. Her hematuria is likely related to a ruptured renal cyst and should  resolved in 48 hours of her anticoagulation. Her hematuria could also be worsened by a superimposed UTI/pyelonephritis. Please continue broad spectrum antibiotics pending her urine culture Left hydronephrosis: Her left hydronephrosis is related to formed clot in the renal pelvis and should improve in the next 48-72 hours when the clot dissolves  Wilkie Aye 03/25/2023, 7:54 AM

## 2023-03-25 NOTE — Progress Notes (Signed)
Physical Therapy Treatment Patient Details Name: Melissa Lopez MRN: [id number] DOB: Jan 18, 1951 Today's Date: 03/25/2023   History of Present Illness Evagene Reaver  is a 72 y.o. female, with a history of CKD stage III, coronary artery disease, sick sinus syndrome status post permanent pacemaker, primary hyperparathyroidism, diabetes mellitus with gastroparesis, and hypertension , and history of Parkinson disease.  -Presents to ED secondary to hematuria, patient reports history of multiple falls, secondary to her Parkinson disease, she does report left flank pain, hematuria, reports she had 3 falls in the past week, was down for at least 4 hours during 1 episode of same, she was noted to have dark-colored urine, and dysuria, generalized weakness, no fever, no chills, no nausea or vomiting, which prompted her to come to ED    PT Comments    Patient presents with new left side weakness, unable to lift left UE/LE against gravity, poor sitting balance with frequent falling backwards and to the left and limited to partial standing using RW due to buckling of knees and poor standing balance.  Patient put back to bed with Max assist to reposition.  Plan: patient later transferred to ICU and will need new PT orders to resume therapy when patient is medically stable.    Recommendations for follow up therapy are one component of a multi-disciplinary discharge planning process, led by the attending physician.  Recommendations may be updated based on patient status, additional functional criteria and insurance authorization.  Follow Up Recommendations  Can patient physically be transported by private vehicle: No    Assistance Recommended at Discharge Intermittent Supervision/Assistance  Patient can return home with the following A lot of help with bathing/dressing/bathroom;A lot of help with walking and/or transfers;Help with stairs or ramp for entrance;Assistance with cooking/housework   Equipment  Recommendations  None recommended by PT    Recommendations for Other Services       Precautions / Restrictions Precautions Precautions: Fall Restrictions Weight Bearing Restrictions: No     Mobility  Bed Mobility Overal bed mobility: Needs Assistance Bed Mobility: Supine to Sit, Sit to Supine     Supine to sit: Max assist Sit to supine: Max assist   General bed mobility comments: unable to maintain sitting balance with frequent falling backwards and to the left    Transfers Overall transfer level: Needs assistance Equipment used: Rolling walker (2 wheels) Transfers: Sit to/from Stand Sit to Stand: Max assist           General transfer comment: Patient limited to partial standing with RW due to left side weakness and poor standing balance    Ambulation/Gait                   Stairs             Wheelchair Mobility    Modified Rankin (Stroke Patients Only)       Balance Overall balance assessment: Needs assistance Sitting-balance support: Feet supported, No upper extremity supported Sitting balance-Leahy Scale: Poor Sitting balance - Comments: seated at EOB   Standing balance support: Reliant on assistive device for balance, During functional activity, Bilateral upper extremity supported Standing balance-Leahy Scale: Poor Standing balance comment: using RW                            Cognition Arousal/Alertness: Awake/alert Behavior During Therapy: WFL for tasks assessed/performed Overall Cognitive Status: Within Functional Limits for tasks assessed  Exercises      General Comments        Pertinent Vitals/Pain Pain Assessment Pain Assessment: Faces Faces Pain Scale: Hurts even more Pain Location: Ankles and L hand Pain Descriptors / Indicators: Heaviness, Grimacing, Discomfort, Guarding Pain Intervention(s): Limited activity within patient's tolerance,  Monitored during session, Repositioned    Home Living                          Prior Function            PT Goals (current goals can now be found in the care plan section) Acute Rehab PT Goals Patient Stated Goal: return home with family to assist PT Goal Formulation: With patient Time For Goal Achievement: 03/27/23 Potential to Achieve Goals: Fair Progress towards PT goals: PT to reassess next treatment    Frequency    Min 3X/week      PT Plan Discharge plan needs to be updated    Co-evaluation              AM-PAC PT "6 Clicks" Mobility   Outcome Measure  Help needed turning from your back to your side while in a flat bed without using bedrails?: A Lot Help needed moving from lying on your back to sitting on the side of a flat bed without using bedrails?: A Lot Help needed moving to and from a bed to a chair (including a wheelchair)?: Total Help needed standing up from a chair using your arms (e.g., wheelchair or bedside chair)?: A Lot Help needed to walk in hospital room?: Total Help needed climbing 3-5 steps with a railing? : Total 6 Click Score: 9    End of Session   Activity Tolerance: Patient limited by fatigue;Other (comment) (limited by new left sided weakness) Patient left: in bed;with call bell/phone within reach Nurse Communication: Mobility status PT Visit Diagnosis: Unsteadiness on feet (R26.81);Other abnormalities of gait and mobility (R26.89);Muscle weakness (generalized) (M62.81)     Time: 1610-9604 PT Time Calculation (min) (ACUTE ONLY): 14 min  Charges:  $Therapeutic Activity: 8-22 mins                     2:02 PM, 03/25/23 Ocie Bob, MPT Physical Therapist with [facility] [phone] office [phone] mobile phone

## 2023-03-25 NOTE — Code Documentation (Addendum)
  Melissa Lopez is a 72 y.o. female with past medical hx of CAD, HTN, CKD, T2DM, Afib on Eliquis, and has a permanent pacemaker who was [facility] with an AKI after fall for which Eliquis is being held.  Code stroke was activated by Dr Nelson Chimes 03/25/23 at 1025  This RN and Caroleen Hamman, RN activated Telestroke Cart at [employer] and met patient in CT at 1037.  Patient's nurse stated that patient began with left wrist pain on Monday and had new left sided weakness this morning with OT. NIHSS 6, see documentation for details and code stroke times. Patient with right facial palsy (previous hx per pt), left arm weakness, bilateral leg weakness on exam. LKW 4/15 and time unclear per patient report. The following imaging was completed:  CT Head.  Patient is not a candidate for IV Thrombolytic due to ICH, per MD.   Unable to get MRI r/t pacemaker.  Received new orders from Dr. Amada Jupiter for BP management and transfer to ICU.   Bedside handoff with bedside RN & LPN  Everette Rank, MSN, APRN, ACCNS-AG, CCRN, TCRN Stroke Coordinator [facility]

## 2023-03-25 NOTE — Consult Note (Addendum)
[facility] Telemedicine Consult   Requesting Provider: Shanda Bumps Consult Participants: Patient, nursing Location of the provider: [facility] Location of the patient: [facility]  This consult was provided via telemedicine with 2-way video and audio communication. The patient/family was informed that care would be provided in this way and agreed to receive care in this manner.    Chief Complaint: Left Sided weakness  HPI: Melissa Lopez is a 72 year old female admitted with AKI in the setting of left hydronephrosis from left renal pelvis blood clot after falling while on Eliquis.  Eliquis has been held since admission on 4/14.  Yesterday, she started having weakness of her left side, but was also complaining of significant left wrist pain and her difficulty with movement was attributed to pain.  Today, however when working with physical therapy it was noted that she was leaning to the left side and therefore a code stroke was activated.    LKW: yesterday, unclear time tpa given?: No, ICH IR Thrombectomy? No, ICH Modified Rankin Scale: 3-Moderate disability-requires help but walks WITHOUT assistance Time of teleneurologist evaluation: 10:39  Exam: Vitals:   03/25/23 0319 03/25/23 0947  BP: (!) 190/83 (!) 155/101  Pulse: 82   Resp: 20   Temp: 99.8 F (37.7 C)   SpO2: 96%     General: In bed, NAD  1A: Level of Consciousness - 0 1B: Ask Month and Age - 0 1C: 'Blink Eyes' & 'Squeeze Hands' - 0 2: Test Horizontal Extraocular Movements - 0 3: Test Visual Fields - 0 4: Test Facial Palsy - 1(right side, old deficit) 5A: Test Left Arm Motor Drift - 3 5B: Test Right Arm Motor Drift - 0 6A: Test Left Leg Motor Drift - 1 6B: Test Right Leg Motor Drift - 1 7: Test Limb Ataxia - 0 8: Test Sensation - 0 9: Test Language/Aphasia- 0 10: Test Dysarthria - 0 11: Test Extinction/Inattention - 0 NIHSS score: 6   Imaging Reviewed: CT head-acute right thalamic hemorrhage in  the setting of extensive cerebrovascular disease.  There appears to be Marina Goodell hemorrhage edema already.  Labs reviewed in epic and pertinent values follow: Creatinine 3.91 Calcium 8.1  Assessment: 72 year old female with a history of atrial fibrillation on Eliquis, currently off anticoagulation since admission due to renal pelvis hematoma with small thalamic hemorrhage.  Given the location and appearance, I suspect hypertension as etiology.  Ischemic stroke with hemorrhagic conversion would be another consideration, but I feel less likely.  She will need close monitoring and aggressive blood pressure control.  No current indications for any type of surgical intervention.  Recommendations:  1)Transfer to ICU, [facility] in case she worsens 2) no antiplatelets or anticoagulants 3) blood pressure control with goal systolic 130 - 150, I have ordered Cleviprex 4) Frequent neuro checks 5) If symptoms worsen or there is decreased mental status, repeat stat head CT 6) PT,OT,ST   This patient is critically ill and at significant risk of neurological worsening, death and care requires constant monitoring of vital signs, hemodynamics,respiratory and cardiac monitoring, neurological assessment, discussion with family, other specialists and medical decision making of high complexity. I spent 43 minutes of neurocritical care time  in the care of  this patient. This was time spent independent of any time provided by nurse practitioner or PA.  Ritta Slot, MD [facility] [phone]  If 7pm- 7am, please page neurology on call as listed in [facility]. 03/25/2023  11:20 AM   Ritta Slot, MD [facility] [phone]  If 7pm- 7am, please page neurology on call as listed in [place].

## 2023-03-25 NOTE — Progress Notes (Signed)
Subjective: Patient reports resolution of abdominal pain. Urine very light pink this morning  Objective: Vital signs in last 24 hours: Temp:  [98.2 F (36.8 C)-99.8 F (37.7 C)] 98.2 F (36.8 C) (04/16 1127) Pulse Rate:  [65-82] 73 (04/16 1150) Resp:  [9-26] 20 (04/16 1150) BP: (118-241)/(62-144) 166/79 (04/16 1150) SpO2:  [96 %-99 %] 96 % (04/16 1150) Weight:  [71.9 kg] 71.9 kg (04/16 1127)  Intake/Output from previous day: 04/15 0701 - 04/16 0700 In: 100 [IV Piggyback:100] Out: 2050 [Urine:2050] Intake/Output this shift: Total I/O In: 100 [IV Piggyback:100] Out: 1000 [Urine:1000]  Physical Exam:  General:alert, cooperative, and appears stated age GI: soft, non tender, normal bowel sounds, no palpable masses, no organomegaly, no inguinal hernia Female genitalia: not done Extremities: extremities normal, atraumatic, no cyanosis or edema  Lab Results: Recent Labs    03/23/23 0420 03/24/23 0619 03/25/23 0446  HGB 11.9* 10.5* 11.1*  HCT 36.2 32.0* 34.0*   BMET Recent Labs    03/24/23 0619 03/25/23 0446  NA 135 132*  K 3.9 3.5  CL 107 104  CO2 21* 19*  GLUCOSE 107* 132*  BUN 36* 37*  CREATININE 3.91* 3.55*  CALCIUM 8.1* 8.4*   No results for input(s): "LABPT", "INR" in the last 72 hours. No results for input(s): "LABURIN" in the last 72 hours. Results for orders placed or performed during the hospital encounter of 03/22/23  Urine Culture (for pregnant, neutropenic or urologic patients or patients with an indwelling urinary catheter)     Status: None (Preliminary result)   Collection Time: 03/22/23  8:40 PM   Specimen: Urine, Clean Catch  Result Value Ref Range Status   Specimen Description   Final    URINE, CLEAN CATCH Performed at [facility], [address] [place], [place] [address]    Special Requests   Final    NONE Performed at [facility], [address] [place], [place] [address]    Culture   Final    CULTURE REINCUBATED FOR BETTER  GROWTH Performed at [facility], [address], [place], [place] [address]    Report Status PENDING  Incomplete    Studies/Results: CT HEAD CODE STROKE WO CONTRAST  Result Date: 03/25/2023 CLINICAL DATA:  Code stroke. 72 year old female with left side weakness beginning yesterday. EXAM: CT HEAD WITHOUT CONTRAST TECHNIQUE: Contiguous axial images were obtained from the base of the skull through the vertex without intravenous contrast. RADIATION DOSE REDUCTION: This exam was performed according to the departmental dose-optimization program which includes automated exposure control, adjustment of the mA and/or kV according to patient size and/or use of iterative reconstruction technique. COMPARISON:  Head CT 06/13/2021. FINDINGS: Brain: Small volume hyperdense hemorrhage in the right thalamus is new, superimposed on chronic bilateral basal ganglia heterogeneity and confluent bilateral cerebral white matter hypodensity. Oval, bilobed right thalamic intra-axial blood products encompassing 12 by 7 by 6 mm (AP by transverse by CC), volume estimated at less than 1 mL. Surrounding hypodensity appears increased since 2022 and is probably in part edema. Other cerebral gray-white matter differentiation does not appear significantly changed from the prior. No intraventricular hemorrhage, ventriculomegaly, intracranial mass effect, acute cortically based infarct. Vascular: Calcified atherosclerosis at the skull base. No suspicious intracranial vascular hyperdensity. Skull: No acute osseous abnormality identified. Chronic hyperostosis. Sinuses/Orbits: Increased inflammatory appearing paranasal sinus opacification and mucosal thickening. No sinus hemorrhage identified. New bilateral mastoid air cell opacification also. Other: No acute orbit or scalp soft tissue finding. ASPECTS [facility] Stroke Program Early  CT Score) Total score (0-10 with 10 being normal): Not applicable, small acute thalamic hemorrhage.  IMPRESSION: 1. Small acute right thalamic hemorrhage, volume less than 1 mL. Evidence of mild surrounding edema but no significant mass effect or other complicating features. 2. Underlying advanced chronic small vessel disease, otherwise not significantly changed from 2022. Study discussed by telephone with Dr. Ritta Slot on 03/25/2023 at 10:57 . Electronically Signed   By: Odessa Fleming M.D.   On: 03/25/2023 11:02   DG Wrist Complete Left  Result Date: 03/25/2023 CLINICAL DATA:  86310 Swelling [id number] EXAM: LEFT WRIST - COMPLETE 3+ VIEW COMPARISON:  None Available. FINDINGS: There is no evidence of acute fracture. Alignment is normal. There is mild radiocarpal and base of thumb osteoarthritis. There is radiocarpal, DRUJ, TFCC, intercarpal, CMC, and MCP joint chondrocalcinosis. No definite bony erosions. There is diffuse soft tissue swelling of the wrist. IMPRESSION: No acute osseous abnormality. Diffuse soft tissue swelling of the wrist. Extensive chondrocalcinosis suggestive of CPPD arthropathy. No evidence of bone erosion. Mild radiocarpal and base of thumb degenerative change. Electronically Signed   By: Caprice Renshaw M.D.   On: 03/25/2023 10:22    Assessment/Plan: 72yo with gross hematuria, left hydronephrosis 1. Hematuria resolved. 2. Left hydronephrosis: The patient is currently asymptomatic and can be managed conservatively. She will followup in 2-3 weeks with the renal US    LOS: 3 days   Wilkie Aye 03/25/2023, 12:08 PM

## 2023-03-25 NOTE — Plan of Care (Signed)
  Problem: Acute Rehab OT Goals (only OT should resolve) Goal: Pt. Will Perform Eating Flowsheets (Taken 03/25/2023 0938) Pt Will Perform Eating:  with modified independence  sitting Goal: Pt. Will Perform Grooming Flowsheets (Taken 03/25/2023 0938) Pt Will Perform Grooming:  with min guard assist  sitting Goal: Pt. Will Perform Upper Body Bathing Flowsheets (Taken 03/25/2023 0938) Pt Will Perform Upper Body Bathing:  with min guard assist  with min assist  sitting Goal: Pt. Will Perform Lower Body Bathing Flowsheets (Taken 03/25/2023 0938) Pt Will Perform Lower Body Bathing:  with mod assist  sitting/lateral leans Goal: Pt. Will Perform Upper Body Dressing Flowsheets (Taken 03/25/2023 0938) Pt Will Perform Upper Body Dressing:  with min guard assist  with min assist  sitting Goal: Pt. Will Perform Lower Body Dressing Flowsheets (Taken 03/25/2023 0938) Pt Will Perform Lower Body Dressing:  with mod assist  sitting/lateral leans Goal: Pt. Will Transfer To Toilet Flowsheets (Taken 03/25/2023 [phone]) Pt Will Transfer to Toilet:  with min assist  stand pivot transfer Goal: Pt. Will Perform Toileting-Clothing Manipulation Flowsheets (Taken 03/25/2023 0938) Pt Will Perform Toileting - Clothing Manipulation and hygiene:  with mod assist  sitting/lateral leans Goal: Pt/Caregiver Will Perform Home Exercise Program Flowsheets (Taken 03/25/2023 [phone]) Pt/caregiver will Perform Home Exercise Program:  Increased ROM  Increased strength  Right Upper extremity  Left upper extremity  With minimal assist  Emunah Texidor OT, MOT

## 2023-03-25 NOTE — Progress Notes (Signed)
[facility] Progress Note  Name: Melissa Lopez MRN: [id number] DOB: 1951/09/26  Chief Complaint:  Left flank pain and blood in urine   Subjective:  When I walked in the room, PT was assessing the patient and he reports she has a markedly different neuro exam from this weekend - she has left sided weakness and is not able to support herself well enough to sit up on the side of the bed with assistance.   She had 2.1 liters UOP over 4/15 and has had 1 liter UOP charted today thus far.  She has been on normal saline at 75 ml/hr.  Review of systems:  Unable to obtain given acute event - she offers limited history   ----------------- Background on consult: Melissa Lopez is an 72 y.o. female with a PMH significant for HTN, DM type 2, CAD, primary hyperparathyroidism, SSS s/p PPM, chronic diastolic CHF, possible polycystic kidney disease, CKD stage IIIb (last known Scr was 1.37 on 08/07/21), and Parkinson's disease who presented to [facility] ED on 03/22/23 with gross hematuria associated with left flank pain.  She also admits to 3 falls in the past week.  In the ED, afebrile, VSS.  Exam with abdominal pain and left flank tenderness.  Labs notable for WBC 17.6, BUN 31, Cr 3.04, CK 33.  CT scan with left hydronephrosis and increased density within the left renal collecting system and ureter.  No evidence of ureteral stone.  Polycystic appearance of both kidneys.  We were consulted due to the development of AKI/CKD Stage IIIb.  Urology was consulted, however she has not yet been seen.  The trend in Scr is seen below.   Eliquis has been held.  She continues to complain of left flank pain but denies any N/V/dysgeusia.  Intake/Output Summary (Last 24 hours) at 03/25/2023 1001 Last data filed at 03/25/2023 0717 Gross per 24 hour  Intake 100 ml  Output 2600 ml  Net -2500 ml    Vitals:  Vitals:   03/24/23 0830 03/24/23 2033 03/25/23 0319 03/25/23 0947  BP: (!) 150/90 (!) 150/83 (!) 190/83  (!) 155/101  Pulse: 72 70 82   Resp: Temp: 98.9 F (37.2 C) 99.6 F (37.6 C) 99.8 F (37.7 C)   TempSrc: Oral     SpO2: 97% 98% 96%   Weight:      Height:         Physical Exam:  General elderly female in bed, seated on edge of bed initially and falling forward without staff bracing her HEENT normocephalic atraumatic  Neck supple trachea midline Lungs clear to auscultation bilaterally normal work of breathing at rest  Heart S1S2 no rub Abdomen soft nontender nondistended Extremities no edema  Neuro gross left-sided weakness - arm and leg. Not able to support herself to sit on edge of bed. Answers limited questions    Medications reviewed   Labs:     Latest Ref Rng & Units 03/25/2023    4:46 AM 03/24/2023    6:19 AM 03/23/2023    4:20 AM  BMP  Glucose 70 - 99 mg/dL 865  784  696   BUN 8 - 23 mg/dL 37  36  34   Creatinine 0.44 - 1.00 mg/dL 2.95  2.84  1.32   Sodium 135 - 145 mmol/L 132  135  137   Potassium 3.5 - 5.1 mmol/L 3.5  3.9  3.1   Chloride 98 - 111 mmol/L 104  107  103  CO2 22 - 32 mmol/L 19  21  22    Calcium 8.9 - 10.3 mg/dL 8.4  8.1  8.9      Assessment/Plan:    Acute Facial droop and left sided weakness - I called the primary team, Dr. Nelson Chimes, upon my arrival and assessment of a new neuro deficit.  She is coming to the bedside.  Notified nursing staff.  Per nursing some concern on 4/15 re: neuro deficits but exam worsening.  Team is assessing for need to call code stroke as unsure of timeline.  Imaging per primary team.   HTN - blood pressure goals per primary team in the setting of active neuro deficit and concern for recent stroke AKI/CKD Stage IIIb - with obstruction of left ureter due to hemorrhage/clot.  There was discussion earlier about transfer to [facility] or [facility] for possible stent vs PNT placement.   She has been evaluated by urology    Slightly improved.  Continue supportive care No uremic symptoms at this time Urology has deferred  intervention at this time and hopeful that hydro will resolve/improve with resolution of the clot Left hydronephrosis - s/p urology evaluation.  They feel that her hydronephrosis is related to a formed clot in the renal pelvis and that this should improve in the next 48-72 hours when the clot dissolves  Gross hematuria - following fall with left hydronephrosis and evidence of hemorrhage/clot.  Eliquis on hold.  S/p urology eval as above PCKD - possible hemorrhagic cyst. Frequent falls - due to Parkinson's disease.  Agree with stopping Eliquis. ABLA - stable  DM type 2 - per primary team    Pyelonephritis - on rocephin. cultures pending. Per primary team.  Will need antibiotics for appropriate duration for pyelo (1-2 weeks or per primary team)   Disposition - continue inpatient monitoring - awaiting urine culture, awaiting improving in AKI and requiring IV fluids, and awaiting resolution or improvement of clot causing hydro   Estanislado Emms, MD 10:29 AM 03/25/2023

## 2023-03-25 NOTE — Progress Notes (Signed)
OT Cancellation Note  Patient Details Name: Melissa Lopez MRN: [id number] DOB: 17-Feb-1951   Cancelled Treatment:    Reason Eval/Treat Not Completed: Other (comment). Pt discharged from acute OT due to moving to higher level of care in the ICU. Pt will need new orders to resume therapy. Thank you.   Adalynd Donahoe OT, MOT   Danie Chandler 03/25/2023, 4:04 PM

## 2023-03-25 NOTE — H&P (Signed)
NAME:  Melissa Lopez, MRN:  [id number], DOB:  06-12-1951, LOS: 3 ADMISSION DATE:  03/22/2023, CONSULTATION DATE:  4/16 REFERRING MD:  Dr. Nelson Chimes, CHIEF COMPLAINT:  hematuria; now w/ R thalamic bleed   History of Present Illness:  Patient is a 72 year old female with pertinent PMH CKD stage III, hx of PE 2019 on eliquis, prior CVA R thalamic lacunar stroke, CAD, HTN, T2DM w/ gastroparesis, sick sinus syndrome s/p PPM placement, primary hyperparathyroidism, parkinsons disease presents to [facility] ED on 4/13 w/ hematuria.  Patient has frequent history of multiple falls secondary to Parkinson's disease, denies any left flank pain.  States she had about 3 falls in the past week.  Her last fall she was down for about 4 hours. Is on eliquis w/ hx of PE. Patient states she started having dark-colored urine and dysuria on 4/13 and came to [facility] ED for further eval.  Patient denies fever, N/V.  ED workup significant for hematochezia and AKI with creat 3.04.  CT renal showing left hydronephrosis and blood clot in renal pelvis.  Patient admitted to [facility]. Urology and nephrology consulted and do not recommend stenting at this time.  Patient started empirically on Rocephin for possible UTI.  Eliquis held.  IV fluids given.  On 4/15 patient complaining of some left-sided weakness thought to be attributed by left sided wrist pain. Patient was working with PT and noted patient to have leaning toward the left side and code stroke was activated.  CT head showing acute right thalamic hemorrhage.  Neuro consulted thought related to HPI and etiology.  Patient started on Cleviprex.  Plan to transfer to [facility] ICU.  PCCM consulted.  Pertinent  Medical History   Past Medical History:  Diagnosis Date   CKD (chronic kidney disease) stage 3, GFR 30-59 ml/min 10/31/2018   - per patient report   Coronary artery disease    per patient report - 2 caths with non-obstructive CAD   Essential hypertension    Frequent falls    H/O cardiac  pacemaker 10/31/2018   implanted 05/2010   Sick sinus syndrome    Type 2 diabetes mellitus with complication, with long-term current use of insulin 10/31/2018   with diabetic nephropathy     Significant Hospital Events: Including procedures, antibiotic start and stop dates in addition to other pertinent events   4/13 admitted to [facility] w/ hematuria; CT renal showing left hydronephrosis and blood clot in renal pelvis.  4/16 code stroke; CT head showing acute right thalamic hemorrhage; started on Cleviprex; transferred to [facility]  Interim History / Subjective:  Patient w/ generalized weakness. Left side weaker than right.  Able to state name, place On cleviprex  Objective   Blood pressure (!) 157/82, pulse 75, temperature 98.2 F (36.8 C), temperature source Oral, resp. rate (!) 23, height  (1.6 m), weight 71.9 kg, SpO2 97 %.        Intake/Output Summary (Last 24 hours) at 03/25/2023 1221 Last data filed at 03/25/2023 0717 Gross per 24 hour  Intake 100 ml  Output 2600 ml  Net -2500 ml   Filed Weights   03/22/23 1825 03/22/23 2300 03/25/23 1127  Weight: 74.8 kg 75.5 kg 71.9 kg    Examination: General: elderly female in NAD HEENT: MM pink/moist Neuro: AO; weak b/l ue and le L >R; can barely move LUE; PERRL CV: s1s2, RRR, no m/r/g PULM:  dim clear BS bilaterally; on room air GI: soft, bsx4 active  Extremities: warm/dry, Left wrist w/ swelling and  some warmth on palpation Skin: no rashes or lesions    Resolved Hospital Problem list     Assessment & Plan:  Acute right thalamic hemorrhage Hx of prior CVA R thalamic lacunar stroke Plan: -Admit to ICU -Neuro following; appreciate recs -Continue Cleviprex for SBP goal 130-150 -Repeat imaging per neuro -frequent neuro checks -limit sedating meds -cont statin -hold DAPT and AC -Continue neuroprotective measures- normothermia, euglycemia, HOB greater than 30, head in neutral alignment, normocapnia, normoxia.   -PT/OT/SLP  AKI on CKD stage IIIb Left hydronephrosis Gross hematuria PCKD: concern for hemorrhage of cysts Plan: -Urology/nephrology following; recommend supportive care for now and follow-up renal US in 2 weeks -hold AC -Trend BMP / urinary output -Replace electrolytes as indicated -Avoid nephrotoxic agents, ensure adequate renal perfusion  UTI Plan: -cont rocephin -f/u cultures  Hx of PE 2019 on Eliquis Plan: -hold AC -scds dvt ppx  SSS s/p PPM: followed by Dr. Ladona Ridgel Plan: -Telemetry monitoring  CAD HTN Chronic diastolic CHF Atrophic cardiomyopathy HCM Plan: -cleviprex as above -cont statin -hold asa -daily weights; strict I/o's  Left wrist pseudogout Plan: -prn tylenol for pain -consider steroids as anti-inflammatory if still having pain to help avoid the use of sedating pain meds; will avoid nsaids with AKI  T2DM w/ gastroparesis Plan: -ssi and cbg monitoring -consider resuming reglan w/ renal dose adjustment  Parkinson disease w/ falls Plan: -PT/OT -cont sinemet IR  Best Practice (right click and "Reselect all SmartList Selections" daily)   Diet/type: NPO DVT prophylaxis: SCD GI prophylaxis: N/A Lines: N/A Foley:  N/A Code Status:  full code Last date of multidisciplinary goals of care discussion [pending]  Labs   CBC: Recent Labs  Lab 03/22/23 1911 03/23/23 0420 03/24/23 0619 03/25/23 0446  WBC 17.6* 19.7* 22.0* 22.8*  HGB 12.0 11.9* 10.5* 11.1*  HCT 35.9* 36.2 32.0* 34.0*  MCV 90.9 92.3 93.0 92.6  PLT 261 245 189 205    Basic Metabolic Panel: Recent Labs  Lab 03/22/23 1911 03/23/23 0420 03/24/23 0619 03/25/23 0446  NA 139 137 135 132*  K 3.8 3.1* 3.9 3.5  CL 100 103 107 104  CO2 25 22 21* 19*  GLUCOSE 168* 102* 107* 132*  BUN 31* 34* 36* 37*  CREATININE 3.04* 3.45* 3.91* 3.55*  CALCIUM 9.4 8.9 8.1* 8.4*   GFR: Estimated Creatinine Clearance: 13.8 mL/min (A) (by C-G formula based on SCr of 3.55 mg/dL (H)). Recent  Labs  Lab 03/22/23 1911 03/23/23 0420 03/24/23 0619 03/25/23 0446  WBC 17.6* 19.7* 22.0* 22.8*    Liver Function Tests: No results for input(s): "AST", "ALT", "ALKPHOS", "BILITOT", "PROT", "ALBUMIN" in the last 168 hours. No results for input(s): "LIPASE", "AMYLASE" in the last 168 hours. No results for input(s): "AMMONIA" in the last 168 hours.  ABG    Component Value Date/Time   TCO2 29 08/01/2021 1304     Coagulation Profile: No results for input(s): "INR", "PROTIME" in the last 168 hours.  Cardiac Enzymes: Recent Labs  Lab 03/22/23 1911  CKTOTAL 33*    HbA1C: Hgb A1c MFr Bld  Date/Time Value Ref Range Status  03/22/2023 07:11 PM 5.2 4.8 - 5.6 % Final    Comment:    (NOTE) Pre diabetes:          5.7%-6.4%  Diabetes:              >6.4%  Glycemic control for   <7.0% adults with diabetes   08/02/2021 05:00 AM 5.5 4.8 - 5.6 % Final  Comment:    (NOTE) Pre diabetes:          5.7%-6.4%  Diabetes:              >6.4%  Glycemic control for   <7.0% adults with diabetes     CBG: Recent Labs  Lab 03/24/23 1114 03/24/23 1605 03/24/23 2018 03/25/23 0728 03/25/23 1132  GLUCAP 171* 174* 152* 168* 187*    Review of Systems:   Review of Systems  Constitutional:  Positive for malaise/fatigue. Negative for fever.  Respiratory:  Negative for shortness of breath.   Cardiovascular:  Negative for chest pain.  Gastrointestinal:  Negative for nausea and vomiting.  Genitourinary:  Positive for hematuria. Negative for flank pain.  Musculoskeletal:  Positive for joint pain.  Neurological:  Positive for weakness.      Past Medical History:  She,  has a past medical history of CKD (chronic kidney disease) stage 3, GFR 30-59 ml/min (10/31/2018), Coronary artery disease, Essential hypertension, Frequent falls, H/O cardiac pacemaker (10/31/2018), Sick sinus syndrome, and Type 2 diabetes mellitus with complication, with long-term current use of insulin (10/31/2018).    Surgical History:   Past Surgical History:  Procedure Laterality Date   BIOPSY  09/28/2020   Procedure: BIOPSY;  Surgeon: Bernette Redbird, MD;  Location: [facility] ENDOSCOPY;  Service: Endoscopy;;   BIOPSY  08/03/2021   Procedure: BIOPSY;  Surgeon: Corbin Ade, MD;  Location: [facility];  Service: Endoscopy;;   CARDIAC PACEMAKER PLACEMENT  2008   in [place] ([facility]) for sick sinus syndrome   CHOLECYSTECTOMY     CORONARY/GRAFT ACUTE MI REVASCULARIZATION N/A 11/09/2020   Procedure: CORONARY/GRAFT ACUTE MI REVASCULARIZATION;  Surgeon: Marykay Lex, MD;  Location: [facility] INVASIVE CV LAB;  Service: Cardiovascular;  Laterality: N/A;   ESOPHAGOGASTRODUODENOSCOPY N/A 09/28/2020   Procedure: ESOPHAGOGASTRODUODENOSCOPY (EGD);  Surgeon: Bernette Redbird, MD;  Location: [facility];  Service: Endoscopy;  Laterality: N/A;   ESOPHAGOGASTRODUODENOSCOPY (EGD) WITH PROPOFOL N/A 11/10/2018   Procedure: ESOPHAGOGASTRODUODENOSCOPY (EGD) WITH PROPOFOL;  Surgeon: Charlott Rakes, MD;  Location[facility] ENDOSCOPY;  Service: Endoscopy;  Laterality: N/A;   ESOPHAGOGASTRODUODENOSCOPY (EGD) WITH PROPOFOL N/A 08/03/2021   severe reflux esophagitis, bile gastritis, felt to have delayed gastric emptying. Pathology from esophageal biopsies with fibrinopurulent exudate and granulation tissue consistent with ulcer base   LEFT HEART CATH AND CORONARY ANGIOGRAPHY N/A 10/31/2018   Procedure: LEFT HEART CATH AND CORONARY ANGIOGRAPHY;  Surgeon: Marykay Lex, MD;  Location: [facility] INVASIVE CV LAB;  Service: Cardiovascular;  Laterality: N/A;   PACEMAKER GENERATOR CHANGE  09/2018   in [place] ([facility])   REPLACEMENT TOTAL KNEE BILATERAL       Social History:   reports that she quit smoking about 53 years ago. Her smoking use included cigarettes. She has never used smokeless tobacco. She reports that she does not currently use alcohol. She reports that she does not currently use drugs.   Family History:  Her family history  includes Bone cancer in her mother; Breast cancer in her mother and sister; Diabetes in her brother; Heart attack (age of onset: 20) in her father; Heart disease in her brother; Other in her sister. There is no history of Colon cancer or Colon polyps.   Allergies Allergies  Allergen Reactions   Contrast Media [Iodinated Contrast Media] Swelling    Contrast Dye - the one made out of shellfish, gives a really bad reaction - swelling   Oxycodone-Acetaminophen Anaphylaxis   Xarelto  [Rivaroxaban] Swelling   Oxycodone Other (  See Comments)    Statused by Person:  EAST, CAMELIA(CE) on [id number]     Home Medications  Prior to Admission medications   Medication Sig Start Date End Date Taking? Authorizing Provider  acetaminophen (TYLENOL) 325 MG tablet Take 650 mg by mouth in the morning, at noon, and at bedtime.   Yes [provider]  apixaban (ELIQUIS) 2.5 MG TABS tablet Take 2.5 mg by mouth 2 (two) times daily.   Yes [provider]  aspirin 81 MG chewable tablet Chew 1 tablet (81 mg total) by mouth daily. 11/13/18  Yes Noralee Stain, DO  carbidopa-levodopa (SINEMET IR) 25-100 MG tablet Take 1 tablet by mouth 3 (three) times daily before meals. 09/23/22  Yes Penumalli, Glenford Bayley, MD  carvedilol (COREG) 6.25 MG tablet Take 6.25 mg by mouth 2 (two) times daily with a meal.   Yes [provider]  cinacalcet (SENSIPAR) 30 MG tablet Take 1 tablet (30 mg total) by mouth 2 (two) times daily with a meal. Patient taking differently: Take 45 mg by mouth in the morning. 06/18/21  Yes Gardenia Phlegm, MD  cyclobenzaprine (FLEXERIL) 5 MG tablet Take 5 mg by mouth at bedtime.   Yes [provider]  FLUoxetine (PROZAC) 20 MG tablet Take 40 mg by mouth at bedtime.   Yes [provider]  gabapentin (NEURONTIN) 400 MG capsule Take 400 mg by mouth at bedtime.   Yes [provider]  hydrALAZINE (APRESOLINE) 50 MG tablet Take 50 mg by mouth 3 (three) times daily.    Yes [provider]  HYDROcodone-acetaminophen (NORCO/VICODIN) 5-325 MG tablet Take 1 tablet by mouth every 6 (six) hours as needed for moderate pain. Patient taking differently: Take 1 tablet by mouth 2 (two) times daily. 08/07/21  Yes Tat, Onalee Hua, MD  lidocaine (LIDODERM) 5 % Place 2 patches onto the skin daily as needed (pain (hip & back)). (Apply hip and back )Remove & Discard patch within 12 hours or as directed by MD   Yes [provider]  Magnesium 400 MG CAPS Take 400 mg by mouth daily.   Yes [provider]  Menthol, Topical Analgesic, (BIOFREEZE EX) Apply 1 application topically 3 (three) times daily as needed (pain.).   Yes [provider]  metoCLOPramide (REGLAN) 10 MG tablet Take 10 mg by mouth in the morning and at bedtime.   Yes [provider]  mirtazapine (REMERON) 7.5 MG tablet Take 7.5 mg by mouth at bedtime.   Yes [provider]  Nutritional Supplements (,FEEDING SUPPLEMENT, PROSOURCE PLUS) liquid Take 30 mLs by mouth daily. 09/29/20  Yes Hongalgi, Maximino Greenland, MD  rosuvastatin (CRESTOR) 40 MG tablet Take 40 mg by mouth in the morning.   Yes [provider]  senna-docusate (SENOKOT-S) 8.6-50 MG tablet Take 2 tablets by mouth 2 (two) times daily.   Yes [provider]  Skin Protectants, Misc. (MINERIN CREME EX) Apply topically daily.   Yes [provider]  traZODone (DESYREL) 50 MG tablet Take 50 mg by mouth at bedtime.   Yes [provider]  UNKNOWN TO PATIENT Place 1-2 sprays into the nose daily. For Degenerative Disc Disease   Yes [provider]  polyethylene glycol (MIRALAX / GLYCOLAX) 17 g packet Take 17 g by mouth 2 (two) times daily. Patient not taking: Reported on 03/22/2023 08/07/21   Catarina Hartshorn, MD     Critical care time: 45 minutes    JD Anselm Lis [facility] 03/25/2023, 12:21  PM  Please see [email or web address] for pager details.  From 7A-7P if no  response, please call [phone]. After hours, please call ELink [phone].

## 2023-03-25 NOTE — Evaluation (Signed)
Occupational Therapy Evaluation Patient Details Name: Melissa Lopez MRN: [id number] DOB: 1951/05/27 Today's Date: 03/25/2023   History of Present Illness Melissa Lopez  is a 72 y.o. female, with a history of CKD stage III, coronary artery disease, sick sinus syndrome status post permanent pacemaker, primary hyperparathyroidism, diabetes mellitus with gastroparesis, and hypertension , and history of Parkinson disease.  -Presents to ED secondary to hematuria, patient reports history of multiple falls, secondary to her Parkinson disease, she does report left flank pain, hematuria, reports she had 3 falls in the past week, was down for at least 4 hours during 1 episode of same, she was noted to have dark-colored urine, and dysuria, generalized weakness, no fever, no chills, no nausea or vomiting, which prompted her to come to ED   Clinical Impression   Pt agreeable to OT evaluation. Pt reported that she lost function in her L UE last night which is a new occurrence. Pt demonstrated no active movement of L UE today. Max A needed for bed mobility with very poor sitting balance. Pt demonstrated significant R side and posterior lean when attempting to sit up at EOB. Unable to attempt standing today due to pt's very poor postural stability. Pt was also noted to heave R head rotation most of session but did not show other signs of visual deficits. Nursing was notified of pt's change in functional status based on her report and comparison to PT's note from 03/23/23. Pt was left in the bed with call bell within reach. Pt will benefit from continued OT in the hospital and recommended venue below to increase strength, balance, and endurance for safe ADL's.        Recommendations for follow up therapy are one component of a multi-disciplinary discharge planning process, led by the attending physician.  Recommendations may be updated based on patient status, additional functional criteria and insurance  authorization.   Assistance Recommended at Discharge Frequent or constant Supervision/Assistance  Patient can return home with the following Two people to help with walking and/or transfers;A lot of help with bathing/dressing/bathroom;Assistance with cooking/housework;Assistance with feeding;Assist for transportation;Help with stairs or ramp for entrance    Functional Status Assessment  Patient has had a recent decline in their functional status and demonstrates the ability to make significant improvements in function in a reasonable and predictable amount of time.  Equipment Recommendations  None recommended by OT           Precautions / Restrictions Precautions Precautions: Fall Restrictions Weight Bearing Restrictions: No      Mobility Bed Mobility Overal bed mobility: Needs Assistance Bed Mobility: Supine to Sit, Sit to Supine     Supine to sit: Max assist, HOB elevated Sit to supine: Max assist   General bed mobility comments: Very poor postural strength; inability to use L UE functionally and needing max A.    Transfers                   General transfer comment: Unable to attempt due to pt's poor postural stability.      Balance Overall balance assessment: Needs assistance Sitting-balance support: Single extremity supported, Feet supported Sitting balance-Leahy Scale: Poor Sitting balance - Comments: poor seated at EOB; R and posterior lean.                                   ADL either performed or assessed with clinical judgement   ADL  Overall ADL's : Needs assistance/impaired Eating/Feeding: Minimal assistance;Sitting   Grooming: Maximal assistance;Sitting   Upper Body Bathing: Maximal assistance;Sitting   Lower Body Bathing: Total assistance;Bed level   Upper Body Dressing : Maximal assistance;Sitting   Lower Body Dressing: Total assistance;Bed level   Toilet Transfer: Total assistance;Maximal assistance Toilet Transfer  Details (indicate cue type and reason): Based on pt's lack of postrual stability and strength even for sitting at EOB. Toileting- Clothing Manipulation and Hygiene: Total assistance;Bed level   Tub/ Shower Transfer: Maximal assistance;Total assistance;Rolling walker (2 wheels)   Functional mobility during ADLs: Maximal assistance;Total assistance       Vision Baseline Vision/History: 1 Wears glasses Ability to See in Adequate Light: 1 Impaired Patient Visual Report: No change from baseline Vision Assessment?: Yes Alignment/Gaze Preference: Head turned Tracking/Visual Pursuits: Other (comment) (Good tracking while supine in bed.) Visual Fields: Other (comment) (No deficits noted on formal testing but pt is demonstrating R neck rotation consistently.)                Pertinent Vitals/Pain Pain Assessment Pain Assessment: 0-10 Pain Score: 6  Pain Location: Ankles and L hand Pain Descriptors / Indicators: Heaviness Pain Intervention(s): Limited activity within patient's tolerance, Monitored during session, Repositioned     Hand Dominance Right   Extremity/Trunk Assessment Upper Extremity Assessment Upper Extremity Assessment: RUE deficits/detail;LUE deficits/detail RUE Deficits / Details: Generally weak; ~75% of A/ROM for shoulder flexion; WFL P/ROM. RUE Coordination: WNL LUE Deficits / Details: Completely flaccid; no movement noted when pt was prompted to do so. LUE Coordination: decreased fine motor;decreased gross motor   Lower Extremity Assessment Lower Extremity Assessment: Defer to PT evaluation       Communication Communication Communication: No difficulties   Cognition Arousal/Alertness: Awake/alert Behavior During Therapy: WFL for tasks assessed/performed Overall Cognitive Status: Within Functional Limits for tasks assessed                                                        Home Living Family/patient expects to be discharged to::  Private residence Living Arrangements: Children Available Help at Discharge: Family;Available PRN/intermittently Type of Home: House Home Access: Stairs to enter [employer] of Steps: 2 Entrance Stairs-Rails: Right Home Layout: Multi-level;Able to live on main level with bedroom/bathroom     Bathroom Shower/Tub: Tub/shower unit   Bathroom Toilet: Standard Bathroom Accessibility: Yes   Home Equipment: Rolling Walker (2 wheels);Grab bars - tub/shower;Cane - single point;Wheelchair - manual;BSC/3in1;Shower seat - built in;Hospital bed          Prior Functioning/Environment Prior Level of Function : Needs assist       Physical Assist : Mobility (physical);ADLs (physical) Mobility (physical): Bed mobility;Transfers;Gait;Stairs ADLs (physical): Bathing;Dressing;Toileting;IADLs Mobility Comments: [place] ambulation with RW per pt report. ADLs Comments: Assisted by family "a little" for bathing, dressing, and toileting. Able to feed and groom herself; aides at [facility]        OT Problem List: Decreased strength;Decreased range of motion;Decreased activity tolerance;Impaired balance (sitting and/or standing);Decreased coordination;Impaired tone;Obesity;Impaired UE functional use;Pain      OT Treatment/Interventions: Self-care/ADL training;Therapeutic exercise;Therapeutic activities;Patient/family education;Balance training;Visual/perceptual remediation/compensation;Neuromuscular education    OT Goals(Current goals can be found in the care plan section) Acute Rehab OT Goals Patient Stated Goal: Open to rehab to get stronger. OT Goal Formulation: With patient Time For Goal Achievement: 04/08/23  Potential to Achieve Goals: Good  OT Frequency: Min 2X/week                                   End of Session Equipment Utilized During Treatment: Gait belt Nurse Communication: Other (comment) (Notified that pt seemed to have a significant decrease in function  including no functional use of L UE.)  Activity Tolerance: Patient tolerated treatment well Patient left: in bed;with call bell/phone within reach  OT Visit Diagnosis: Unsteadiness on feet (R26.81);Other abnormalities of gait and mobility (R26.89);Muscle weakness (generalized) (M62.81);History of falling (Z91.81)                Time: 4098-1191 OT Time Calculation (min): 27 min Charges:  OT General Charges $OT Visit: 1 Visit OT Evaluation $OT Eval Low Complexity: 1 Low  Normajean Nash OT, MOT  Danie Chandler 03/25/2023, 9:34 AM

## 2023-03-26 ENCOUNTER — Inpatient Hospital Stay (HOSPITAL_COMMUNITY): Payer: Medicare (Managed Care)

## 2023-03-26 DIAGNOSIS — I610 Nontraumatic intracerebral hemorrhage in hemisphere, subcortical: Secondary | ICD-10-CM

## 2023-03-26 DIAGNOSIS — N179 Acute kidney failure, unspecified: Secondary | ICD-10-CM | POA: Diagnosis not present

## 2023-03-26 DIAGNOSIS — N178 Other acute kidney failure: Secondary | ICD-10-CM | POA: Diagnosis not present

## 2023-03-26 DIAGNOSIS — I161 Hypertensive emergency: Secondary | ICD-10-CM | POA: Diagnosis not present

## 2023-03-26 DIAGNOSIS — N185 Chronic kidney disease, stage 5: Secondary | ICD-10-CM

## 2023-03-26 DIAGNOSIS — I639 Cerebral infarction, unspecified: Secondary | ICD-10-CM

## 2023-03-26 DIAGNOSIS — I612 Nontraumatic intracerebral hemorrhage in hemisphere, unspecified: Secondary | ICD-10-CM | POA: Diagnosis not present

## 2023-03-26 DIAGNOSIS — R310 Gross hematuria: Secondary | ICD-10-CM | POA: Diagnosis not present

## 2023-03-26 LAB — CBC
HCT: 31.5 % — ABNORMAL LOW (ref 36.0–46.0)
Hemoglobin: 10.8 g/dL — ABNORMAL LOW (ref 12.0–15.0)
MCH: 30.7 pg (ref 26.0–34.0)
MCHC: 34.3 g/dL (ref 30.0–36.0)
MCV: 89.5 fL (ref 80.0–100.0)
Platelets: 214 10*3/uL (ref 150–400)
RBC: 3.52 MIL/uL — ABNORMAL LOW (ref 3.87–5.11)
RDW: 12.9 % (ref 11.5–15.5)
WBC: 18.2 10*3/uL — ABNORMAL HIGH (ref 4.0–10.5)
nRBC: 0 % (ref 0.0–0.2)

## 2023-03-26 LAB — GLUCOSE, CAPILLARY
Glucose-Capillary: 113 mg/dL — ABNORMAL HIGH (ref 70–99)
Glucose-Capillary: 124 mg/dL — ABNORMAL HIGH (ref 70–99)
Glucose-Capillary: 125 mg/dL — ABNORMAL HIGH (ref 70–99)
Glucose-Capillary: 128 mg/dL — ABNORMAL HIGH (ref 70–99)
Glucose-Capillary: 178 mg/dL — ABNORMAL HIGH (ref 70–99)
Glucose-Capillary: 181 mg/dL — ABNORMAL HIGH (ref 70–99)
Glucose-Capillary: 207 mg/dL — ABNORMAL HIGH (ref 70–99)

## 2023-03-26 LAB — LIPID PANEL
Cholesterol: 66 mg/dL (ref 0–200)
HDL: 29 mg/dL — ABNORMAL LOW (ref >40)
LDL Cholesterol: 14 mg/dL (ref 0–99)
Total CHOL/HDL Ratio: 2.3 RATIO
Triglycerides: 114 mg/dL (ref <150)
VLDL: 23 mg/dL (ref 0–40)

## 2023-03-26 LAB — BASIC METABOLIC PANEL
Anion gap: 15 (ref 5–15)
BUN: 36 mg/dL — ABNORMAL HIGH (ref 8–23)
CO2: 20 mmol/L — ABNORMAL LOW (ref 22–32)
Calcium: 8.4 mg/dL — ABNORMAL LOW (ref 8.9–10.3)
Chloride: 100 mmol/L (ref 98–111)
Creatinine, Ser: 2.95 mg/dL — ABNORMAL HIGH (ref 0.44–1.00)
GFR, Estimated: 16 mL/min — ABNORMAL LOW (ref >60)
Glucose, Bld: 144 mg/dL — ABNORMAL HIGH (ref 70–99)
Potassium: 3.2 mmol/L — ABNORMAL LOW (ref 3.5–5.1)
Sodium: 135 mmol/L (ref 135–145)

## 2023-03-26 LAB — MAGNESIUM: Magnesium: 1.5 mg/dL — ABNORMAL LOW (ref 1.7–2.4)

## 2023-03-26 MED ORDER — CARVEDILOL 12.5 MG PO TABS
12.5000 mg | ORAL_TABLET | Freq: Two times a day (BID) | ORAL | Status: DC
  Administered 2023-03-26 – 2023-04-01 (×12): 12.5 mg via ORAL
  Filled 2023-03-26 (×12): qty 1

## 2023-03-26 MED ORDER — ACETAMINOPHEN 325 MG PO TABS
650.0000 mg | ORAL_TABLET | ORAL | Status: DC | PRN
  Administered 2023-03-26 – 2023-03-31 (×6): 650 mg via ORAL
  Filled 2023-03-26 (×8): qty 2

## 2023-03-26 MED ORDER — CYCLOBENZAPRINE HCL 10 MG PO TABS
5.0000 mg | ORAL_TABLET | Freq: Every day | ORAL | Status: DC
  Administered 2023-03-26 – 2023-03-31 (×6): 5 mg via ORAL
  Filled 2023-03-26 (×6): qty 1

## 2023-03-26 MED ORDER — MIRTAZAPINE 15 MG PO TABS
7.5000 mg | ORAL_TABLET | Freq: Every day | ORAL | Status: DC
  Administered 2023-03-26 – 2023-03-31 (×6): 7.5 mg via ORAL
  Filled 2023-03-26 (×6): qty 1

## 2023-03-26 MED ORDER — GABAPENTIN 100 MG PO CAPS
200.0000 mg | ORAL_CAPSULE | Freq: Every day | ORAL | Status: DC
  Administered 2023-03-26 – 2023-03-31 (×6): 200 mg via ORAL
  Filled 2023-03-26 (×6): qty 2

## 2023-03-26 MED ORDER — FLUOXETINE HCL 20 MG PO TABS
40.0000 mg | ORAL_TABLET | Freq: Every day | ORAL | Status: DC
  Administered 2023-03-26: 40 mg via ORAL
  Filled 2023-03-26 (×2): qty 2

## 2023-03-26 MED ORDER — POTASSIUM CHLORIDE CRYS ER 20 MEQ PO TBCR
20.0000 meq | EXTENDED_RELEASE_TABLET | ORAL | Status: AC
  Administered 2023-03-26 (×2): 20 meq via ORAL
  Filled 2023-03-26 (×2): qty 1

## 2023-03-26 MED ORDER — MAGNESIUM SULFATE 4 GM/100ML IV SOLN
4.0000 g | Freq: Once | INTRAVENOUS | Status: AC
  Administered 2023-03-26: 4 g via INTRAVENOUS
  Filled 2023-03-26: qty 100

## 2023-03-26 MED ORDER — ROSUVASTATIN CALCIUM 20 MG PO TABS
20.0000 mg | ORAL_TABLET | Freq: Every day | ORAL | Status: DC
  Administered 2023-03-27 – 2023-04-01 (×6): 20 mg via ORAL
  Filled 2023-03-26 (×6): qty 1

## 2023-03-26 MED ORDER — METOCLOPRAMIDE HCL 5 MG PO TABS
5.0000 mg | ORAL_TABLET | Freq: Three times a day (TID) | ORAL | Status: DC
  Administered 2023-03-26 – 2023-04-01 (×20): 5 mg via ORAL
  Filled 2023-03-26 (×21): qty 1

## 2023-03-26 MED ORDER — AMLODIPINE BESYLATE 5 MG PO TABS
5.0000 mg | ORAL_TABLET | Freq: Every day | ORAL | Status: DC
  Administered 2023-03-26 – 2023-03-27 (×2): 5 mg via ORAL
  Filled 2023-03-26 (×2): qty 1

## 2023-03-26 MED ORDER — CARVEDILOL 3.125 MG PO TABS
6.2500 mg | ORAL_TABLET | Freq: Once | ORAL | Status: AC
  Administered 2023-03-26: 6.25 mg via ORAL
  Filled 2023-03-26: qty 2

## 2023-03-26 NOTE — Evaluation (Signed)
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   Right thalamic stroke 03/25/2023   Nontraumatic subcortical hemorrhage of right cerebral hemisphere 03/25/2023   Hypertensive emergency 03/25/2023   Hematuria 03/22/2023   Hydronephrosis 03/22/2023   GERD (gastroesophageal reflux disease) 01/01/2022   Constipation    Abdominal pain    Acute renal failure superimposed on stage 3b chronic kidney disease 08/02/2021   Hematemesis 08/01/2021   Orthostasis    Seizure-like activity 06/13/2021   Loss of weight    Primary hypertension    Chest pain of uncertain etiology 11/09/2020   Primary hyperparathyroidism 09/25/2020   Emesis    Hypophosphatemia    Hypokalemia 09/22/2020   Chronic diastolic CHF (congestive heart failure) 09/22/2020   Noninfective gastroenteritis and colitis, unspecified 04/13/2020   Unilateral primary osteoarthritis, right knee 04/13/2020   Arthritis 04/13/2020   Bradycardia 04/13/2020   Cardiomyopathy, hypertrophic 04/13/2020   Class 1 obesity due to excess calories with body mass index (BMI) of 34.0 to 34.9 in adult 04/13/2020   Acute kidney failure, unspecified 04/29/2019   Intractable nausea and vomiting 11/10/2018   Malnutrition of moderate degree 11/09/2018   AKI (acute kidney injury)    PE (pulmonary thromboembolism)    H/O cardiac pacemaker 10/31/2018   Syncope 10/31/2018   ST elevation on EKG without MI 10/31/2018   Chest pain with high risk of acute coronary syndrome 10/31/2018   Diabetes mellitus with diabetic nephropathy 10/31/2018   Type 2 diabetes mellitus with complication, with long-term current use of insulin 10/31/2018   CKD (chronic kidney disease) stage 3, GFR 30-59 ml/min (HCC) - per patient  report 10/31/2018   Presence of cardiac pacemaker 10/31/2018   Essential hypertension    Hypercalcemia 07/27/2015   Past Medical History:  Past Medical History:  Diagnosis Date   CKD (chronic kidney disease) stage 3, GFR 30-59 ml/min 10/31/2018   - per patient report   Coronary artery disease    per patient report - 2 caths with non-obstructive CAD   Essential hypertension    Frequent falls    H/O cardiac pacemaker 10/31/2018   implanted 05/2010   Sick sinus syndrome    Type 2 diabetes mellitus with complication, with long-term current use of insulin 10/31/2018   with diabetic nephropathy   Past Surgical History:  Past Surgical History:  Procedure Laterality Date   BIOPSY  09/28/2020   Procedure: BIOPSY;  Surgeon: Bernette Redbird, MD;  Location: [facility];  Service: Endoscopy;;   BIOPSY  08/03/2021   Procedure: BIOPSY;  Surgeon: Corbin Ade, MD;  Location: [facility];  Service: Endoscopy;;   CARDIAC PACEMAKER PLACEMENT  2008   in [place] ([place]) for sick sinus syndrome   CHOLECYSTECTOMY     CORONARY/GRAFT ACUTE MI REVASCULARIZATION N/A 11/09/2020   Procedure: CORONARY/GRAFT ACUTE MI REVASCULARIZATION;  Surgeon: Marykay Lex, MD;  Location[facility] INVASIVE CV LAB;  Service: Cardiovascular;  Laterality: N/A;   ESOPHAGOGASTRODUODENOSCOPY N/A 09/28/2020   Procedure: ESOPHAGOGASTRODUODENOSCOPY (EGD);  Surgeon: Bernette Redbird, MD;  Location: [facility];  Service: Endoscopy;  Laterality: N/A;   ESOPHAGOGASTRODUODENOSCOPY (EGD) WITH PROPOFOL N/A 11/10/2018   Procedure: ESOPHAGOGASTRODUODENOSCOPY (EGD) WITH PROPOFOL;  Surgeon: Charlott Rakes, MD;  Location: [facility] ENDOSCOPY;  Service: Endoscopy;  Laterality: N/A;   ESOPHAGOGASTRODUODENOSCOPY (EGD) WITH PROPOFOL N/A 08/03/2021  severe reflux esophagitis, bile gastritis, felt to have delayed gastric emptying. Pathology from esophageal biopsies with fibrinopurulent exudate and granulation tissue consistent with ulcer base    LEFT HEART CATH AND CORONARY ANGIOGRAPHY N/A 10/31/2018   Procedure: LEFT HEART CATH AND CORONARY ANGIOGRAPHY;  Surgeon: Marykay Lex, MD;  Location: [facility] INVASIVE CV LAB;  Service: Cardiovascular;  Laterality: N/A;   PACEMAKER GENERATOR CHANGE  09/2018   in [place] ([facility])   REPLACEMENT TOTAL KNEE BILATERAL     HPI:  Pt is a 72 y.o. female who presented hematuria. Pt found to have AKI. On 4/16 left-sided weakness was noted by PT and code stroke was called. CT head 4/16: Small acute right thalamic hemorrhage, volume less than 1 mL. Mild surrounding edema but no significant mass effect or other complicating features. PMH: dementia CKD stage III, coronary artery disease, sick sinus syndrome status post permanent pacemaker, primary hyperparathyroidism, diabetes mellitus with gastroparesis, hypertension, Parkinson disease.   Assessment / Plan / Recommendation Clinical Impression  Pt participated in speech-language-cognition evaluation with her daughter present. Pt's daughter reported that the pt has difficulty with memory secondary to mild dementia and that her speech "has been getting softer" through the years. The [facility] Mental Status Examination was completed to evaluate the pt's cognitive-linguistic skills. She achieved a score of 18/30 which is below the normal limits of 27 or more out of 30. She exhibited difficulty in the areas of memory and executive function as it relates to mental flexibility, awareness, and complex problem solving. Mild dysarthria was noted characterized by reduced articulatory precision and reduced vocal intensity. Pt daughter stated that the pt's cognitive and motor speech performance today was representative for her baseline, likely secondary to dementia and parkinson's disease, respectively. Further acute skilled SLP services are not clinically indicated at this time; however, pt's daughter has verbalized agreement with following up with the pt's outpatient  neurologist regarding outpatient SLP services for PD-related dysarthria.    SLP Assessment  SLP Recommendation/Assessment: Patient does not need any further Speech Lanaguage Pathology Services SLP Visit Diagnosis: Cognitive communication deficit (R41.841);Dysarthria and anarthria (R47.1)    Recommendations for follow up therapy are one component of a multi-disciplinary discharge planning process, led by the attending physician.  Recommendations may be updated based on patient status, additional functional criteria and insurance authorization.    Follow Up Recommendations       Assistance Recommended at Discharge     Functional Status Assessment Patient has not had a recent decline in their functional status  Frequency and Duration           SLP Evaluation Cognition  Overall Cognitive Status: History of cognitive impairments - at baseline Arousal/Alertness: Awake/alert Orientation Level: Oriented to person;Oriented to place;Disoriented to time Year: 2024 Month: March Day of Week: Correct Attention: Focused;Sustained Focused Attention: Appears intact Sustained Attention: Impaired Sustained Attention Impairment: Verbal complex Memory: Impaired Memory Impairment:  (Immediate: 5/5 with repetition x3; delayed: 4/5; with cue: 1/1; paragraph: 8/8) Awareness: Impaired Awareness Impairment: Emergent impairment Problem Solving: Impaired Problem Solving Impairment: Verbal complex (money: 1/3; time: 0/1) Executive Function: Sequencing;Organizing Sequencing: Impaired Sequencing Impairment: Verbal complex (Lopez: 0/4) Organizing:  (backward digit span: 0/2)       Comprehension  Auditory Comprehension Overall Auditory Comprehension: Appears within functional limits for tasks assessed Yes/No Questions: Within Functional Limits Commands: Within Functional Limits Conversation: Complex Reading Comprehension Reading Status: Within funtional limits    Expression Expression Primary Mode  of Expression: Verbal Verbal Expression Overall  Verbal Expression: Appears within functional limits for tasks assessed Initiation: No impairment Level of Generative/Spontaneous Verbalization: Conversation Repetition: No impairment Naming: No impairment Pragmatics: No impairment   Oral / Motor  Oral Motor/Sensory Function Overall Oral Motor/Sensory Function: Generalized oral weakness Motor Speech Overall Motor Speech: Impaired at baseline Respiration: Impaired Level of Impairment: Conversation Phonation: Low vocal intensity Resonance: Within functional limits Articulation: Impaired Level of Impairment: Conversation Intelligibility: Intelligibility reduced Word: 75-100% accurate Phrase: 75-100% accurate Sentence: 75-100% accurate Conversation: 75-100% accurate Motor Planning: Witnin functional limits Motor Speech Errors: Not applicable           Melissa Lopez I. Vear Clock, MS, CCC-SLP Acute Rehabilitation Services Office number [phone]  Scheryl Marten 03/26/2023, 10:45 AM

## 2023-03-26 NOTE — Progress Notes (Signed)
[facility] Progress Note    Assessment/ Plan:   Acute right thalamic ICH - per neuro, SBP goal <160, receiving cleviprex  HTN - see #1 AKI/CKD Stage IIIb - with obstruction of left ureter due to hemorrhage/clot.  She has been evaluated by urology. Cr improving.  Continue supportive care No uremic symptoms at this time Urology has deferred intervention at this time and hopeful that hydro will resolve/improve with resolution of the clot Left hydronephrosis - s/p urology evaluation.  They feel that her hydronephrosis is related to a formed clot in the renal pelvis and that this should improve in the next 48-72 hours when the clot dissolves  Gross hematuria - following fall with left hydronephrosis and evidence of hemorrhage/clot.  Eliquis on hold.  S/p urology eval as above. Suspecting this is secondary to ruptured cyst secondary to fall PCKD - possible hemorrhagic cyst. Frequent falls - due to Parkinson's disease.  Agree with stopping Eliquis. ABLA - stable  DM type 2 - per primary team    Pyelonephritis - on rocephin. Per primary team.  Will need antibiotics for appropriate duration for pyelo (1-2 weeks or per primary team)  Subjective:   Transferred from [facility] for acute right thalamic ICH No acute events Urine clearing up Discussed with daughter at the bedside UOP ~1.4L   Objective:   BP 137/77   Pulse 95   Temp 98.2 F (36.8 C) (Oral)   Resp 20   Ht  (1.6 m)   Wt 76.7 kg   SpO2 95%   BMI 29.95 kg/m   Intake/Output Summary (Last 24 hours) at 03/26/2023 1205 Last data filed at 03/26/2023 0600 Gross per 24 hour  Intake 137.95 ml  Output 480 ml  Net -342.05 ml   Weight change:   Physical Exam: Gen: NAD CVS: RRR Resp: CTA B/L Abd: soft, nt/nd Ext: no edema Neuro: left sided weakness, awake/alert  Imaging: CT HEAD WO CONTRAST ( )  Result Date: 03/26/2023 CLINICAL DATA:  72 year old female status post code stroke presentation yesterday. Left  side weakness. Small acute right thalamic hemorrhage. EXAM: CT HEAD WITHOUT CONTRAST TECHNIQUE: Contiguous axial images were obtained from the base of the skull through the vertex without intravenous contrast. RADIATION DOSE REDUCTION: This exam was performed according to the departmental dose-optimization program which includes automated exposure control, adjustment of the mA and/or kV according to patient size and/or use of iterative reconstruction technique. COMPARISON:  Head CT 03/25/2023. FINDINGS: Brain: Small hyperdense, bilobed intra-axial hemorrhage of the right thalamus is stable from yesterday (sagittal images 23 and 24), still suspected at less than 1 mL volume. Possible regional edema but no significant mass effect. Underlying advanced chronic small vessel disease with confluent hypodensity in the bilateral cerebral white matter and scattered hypodensity in the bilateral deep gray nuclei (left basal ganglia is relatively spared). No IVH or ventriculomegaly.  No new intracranial abnormality. Vascular: Calcified atherosclerosis at the skull base. No suspicious intracranial vascular hyperdensity. Skull: No acute osseous abnormality identified. Sinuses/Orbits: Stable bilateral paranasal sinus mucosal thickening and opacification. Bilateral mastoid opacification is stable. Other: No acute orbit or scalp soft tissue finding. IMPRESSION: 1. Stable small bilobed right thalamic hemorrhage. Possible surrounding edema but no significant mass effect. No intraventricular extension or other complicating features. 2. Underlying advanced small vessel disease. 3. No new intracranial abnormality. Electronically Signed   By: Odessa Fleming M.D.   On: 03/26/2023 06:21   CT HEAD CODE STROKE WO CONTRAST  Result Date: 03/25/2023 CLINICAL DATA:  Code stroke. 72 year old female with left side weakness beginning yesterday. EXAM: CT HEAD WITHOUT CONTRAST TECHNIQUE: Contiguous axial images were obtained from the base of the skull  through the vertex without intravenous contrast. RADIATION DOSE REDUCTION: This exam was performed according to the departmental dose-optimization program which includes automated exposure control, adjustment of the mA and/or kV according to patient size and/or use of iterative reconstruction technique. COMPARISON:  Head CT 06/13/2021. FINDINGS: Brain: Small volume hyperdense hemorrhage in the right thalamus is new, superimposed on chronic bilateral basal ganglia heterogeneity and confluent bilateral cerebral white matter hypodensity. Oval, bilobed right thalamic intra-axial blood products encompassing 12 by 7 by 6 mm (AP by transverse by CC), volume estimated at less than 1 mL. Surrounding hypodensity appears increased since 2022 and is probably in part edema. Other cerebral gray-white matter differentiation does not appear significantly changed from the prior. No intraventricular hemorrhage, ventriculomegaly, intracranial mass effect, acute cortically based infarct. Vascular: Calcified atherosclerosis at the skull base. No suspicious intracranial vascular hyperdensity. Skull: No acute osseous abnormality identified. Chronic hyperostosis. Sinuses/Orbits: Increased inflammatory appearing paranasal sinus opacification and mucosal thickening. No sinus hemorrhage identified. New bilateral mastoid air cell opacification also. Other: No acute orbit or scalp soft tissue finding. ASPECTS [facility] Stroke Program Early CT Score) Total score (0-10 with 10 being normal): Not applicable, small acute thalamic hemorrhage. IMPRESSION: 1. Small acute right thalamic hemorrhage, volume less than 1 mL. Evidence of mild surrounding edema but no significant mass effect or other complicating features. 2. Underlying advanced chronic small vessel disease, otherwise not significantly changed from 2022. Study discussed by telephone with Dr. Ritta Slot on 03/25/2023 at 10:57 . Electronically Signed   By: Odessa Fleming M.D.   On: 03/25/2023  11:02   DG Wrist Complete Left  Result Date: 03/25/2023 CLINICAL DATA:  86310 Swelling 16109 EXAM: LEFT WRIST - COMPLETE 3+ VIEW COMPARISON:  None Available. FINDINGS: There is no evidence of acute fracture. Alignment is normal. There is mild radiocarpal and base of thumb osteoarthritis. There is radiocarpal, DRUJ, TFCC, intercarpal, CMC, and MCP joint chondrocalcinosis. No definite bony erosions. There is diffuse soft tissue swelling of the wrist. IMPRESSION: No acute osseous abnormality. Diffuse soft tissue swelling of the wrist. Extensive chondrocalcinosis suggestive of CPPD arthropathy. No evidence of bone erosion. Mild radiocarpal and base of thumb degenerative change. Electronically Signed   By: Caprice Renshaw M.D.   On: 03/25/2023 10:22    Labs: BMET Recent Labs  Lab 03/22/23 1911 03/23/23 0420 03/24/23 0619 03/25/23 0446 03/26/23 0228  NA 139 137 135 132* 135  K 3.8 3.1* 3.9 3.5 3.2*  CL 100 103 107 104 100  CO2 25 22 21* 19* 20*  GLUCOSE 168* 102* 107* 132* 144*  BUN 31* 34* 36* 37* 36*  CREATININE 3.04* 3.45* 3.91* 3.55* 2.95*  CALCIUM 9.4 8.9 8.1* 8.4* 8.4*   CBC Recent Labs  Lab 03/23/23 0420 03/24/23 0619 03/25/23 0446 03/26/23 0228  WBC 19.7* 22.0* 22.8* 18.2*  HGB 11.9* 10.5* 11.1* 10.8*  HCT 36.2 32.0* 34.0* 31.5*  MCV 92.3 93.0 92.6 89.5  PLT 245 189 205 214    Medications:     carbidopa-levodopa  1 tablet Oral TID AC   carvedilol  12.5 mg Oral BID WC   Chlorhexidine Gluconate Cloth  6 each Topical Daily   cinacalcet  45 mg Oral q AM   cyclobenzaprine  5 mg Oral QHS   FLUoxetine  40 mg Oral QHS   gabapentin  200 mg Oral  QHS   hydrALAZINE  50 mg Oral TID   insulin aspart  0-9 Units Subcutaneous Q4H   metoCLOPramide  5 mg Oral TID AC   mirtazapine  7.5 mg Oral QHS   polyethylene glycol  17 g Oral Daily   [START ON 03/27/2023] rosuvastatin  20 mg Oral Daily   senna-docusate  1 tablet Oral QHS   sodium bicarbonate  650 mg Oral TID      Anthony Sar,  MD [facility] 03/26/2023, 12:05 PM

## 2023-03-26 NOTE — Progress Notes (Signed)
Occupational Therapy Treatment Patient Details Name: Melissa Lopez MRN: [id number] DOB: March 06, 1951 Today's Date: 03/26/2023   History of present illness Melissa Lopez is a 72 y.o. female who presents to ED at [facility] secondary to hematuriawith recent history of multiple falls, secondary to her Parkinson disease, she does report left flank pain, hematuria.  Increased left weakness reported with therapy on 4/16 with positive CT scan noted for right acute thalamic hemorrage.  She was then transferred to [facility] for closer monitoring.  PMH of CKD stage III, coronary artery disease, sick sinus syndrome status post permanent pacemaker, primary hyperparathyroidism, diabetes mellitus with gastroparesis, hypertension, and history of Parkinson disease.   OT comments  Pt currently at max to max +2 assist level for simulated selfcare sit to stand and transfers to the recliner.  She exhibits some motor impairment with initiation and movement as well as increased LUE swelling, pain, and decreased AROM.  Feel she will benefit from acute care OT to work toward getting her back to a level that she can return home with her daughter and other family members who were assisting her with selfcare prior to this admission.  Patient will benefit from intensive inpatient follow up therapy, >3 hours/day.     Recommendations for follow up therapy are one component of a multi-disciplinary discharge planning process, led by the attending physician.  Recommendations may be updated based on patient status, additional functional criteria and insurance authorization.    Assistance Recommended at Discharge Frequent or constant Supervision/Assistance  Patient can return home with the following  A lot of help with bathing/dressing/bathroom;Assistance with cooking/housework;Assist for transportation;Help with stairs or ramp for entrance;A lot of help with walking and/or transfers;Assistance with feeding;Direct supervision/assist  for financial management   Equipment Recommendations  None recommended by OT (TBD next venue of care)    Recommendations for Other Services Rehab consult    Precautions / Restrictions Precautions Precautions: Fall Precaution Comments: Hx of Parkinson's Restrictions Weight Bearing Restrictions: No       Mobility Bed Mobility Overal bed mobility: Needs Assistance Bed Mobility: Supine to Sit     Supine to sit: Total assist     General bed mobility comments: Total assist for all aspects of supine to sit EOB.    Transfers Overall transfer level: Needs assistance Equipment used: None Transfers: Sit to/from Stand Sit to Stand: Max assist (3/4 stand with inability to maintain)   Squat pivot transfers: Max assist       General transfer comment: Pt unable to complete stand or step pivot, performed squat pivot to the recliner for OOB.     Balance Overall balance assessment: Needs assistance Sitting-balance support: Feet supported, No upper extremity supported Sitting balance-Leahy Scale: Poor Sitting balance - Comments: right lean in sitting with min assist initially to maintain balance progressing to min guard.   Standing balance support: During functional activity Standing balance-Leahy Scale: Zero Standing balance comment: needs UE support and 1-2 therapist assist                           ADL either performed or assessed with clinical judgement   ADL Overall ADL's : Needs assistance/impaired Eating/Feeding: Maximal assistance;Sitting Eating/Feeding Details (indicate cue type and reason): simulated Grooming: Maximal assistance;Sitting Grooming Details (indicate cue type and reason): simulated Upper Body Bathing: Maximal assistance;Sitting Upper Body Bathing Details (indicate cue type and reason): simulated Lower Body Bathing: Maximal assistance;+2 for safety/equipment;+2 for physical assistance;Sit to/from stand Lower Body  Bathing Details (indicate cue  type and reason): simulated Upper Body Dressing : Maximal assistance;Sitting Upper Body Dressing Details (indicate cue type and reason): simulated Lower Body Dressing: Maximal assistance;Sit to/from stand Lower Body Dressing Details (indicate cue type and reason): simulated Toilet Transfer: Maximal assistance;Squat-pivot Toilet Transfer Details (indicate cue type and reason): simulated to bedside recliner Toileting- Clothing Manipulation and Hygiene: Maximal assistance;+2 for safety/equipment;Sit to/from stand Toileting - Clothing Manipulation Details (indicate cue type and reason): simulated     Functional mobility during ADLs: Maximal assistance (squat pivot to the bedside recliner) General ADL Comments: Pt with slow initiation to movement throughout session during bed mobility or when asked to move UEs for MMT.  Attempted sit to stand but pt unable to maintain secondary to increased right ankle and foot pain.  Completed scoot pivot to the recliner with max assist and increased time.  Per daughter report, she provides assist with most of bathing, dressing, and toileting tasks but pt was able to transfer over to the [facility] at night on her own to completed toileting in her room.    Extremity/Trunk Assessment Upper Extremity Assessment Upper Extremity Assessment: RUE deficits/detail;LUE deficits/detail RUE Deficits / Details: Shoulder flexion AROM 0-approximatley 50 degrees, able to flex elbow actively to bring hand to mouth with increased time but unable to position hand on therapists shoulder during standing or transfer.  Gross grasp and release present with decreased overall FM coordination. RUE Coordination: decreased gross motor;decreased fine motor LUE Deficits / Details: Limited AROM throughout. Increased pain and swelling noted at the wrist and hand.   Unable to lift arm up at the shoulder when cued to do so, PROM WFLs for shoulder flexion with trace shoulder activation noted against gravity.   Elbow 2-/5 with pain noted during AAROM, grip 2/5 grossly.  Pt when asked to complete a movement would state "I can't" but would then be able to exhibit improved movement after PROM or AAROM was completed. LUE: Unable to fully assess due to pain LUE Coordination: decreased fine motor;decreased gross motor   Lower Extremity Assessment Lower Extremity Assessment: Defer to PT evaluation   Cervical / Trunk Assessment Cervical / Trunk Assessment: Other exceptions Cervical / Trunk Exceptions: Head with left lateral and forward flexion.  Increased tightness with attempted PROM left lateral flexion with inability for patient to actively laterally flex to midline and hold.    Vision Baseline Vision/History: 1 Wears glasses Ability to See in Adequate Light: 0 Adequate Patient Visual Report: No change from baseline Additional Comments: Will continue to assess in treatment but pt with no new onset visual deficits since this CVA.   Perception Perception Perception: Impaired Body Scheme: head tilt to the right   Praxis Praxis Praxis: Impaired Praxis Impairment Details: Ideomotor;Motor planning    Cognition Arousal/Alertness: Awake/alert Behavior During Therapy: Flat affect Overall Cognitive Status: History of cognitive impairments - at baseline                                                     Pertinent Vitals/ Pain       Pain Assessment Pain Assessment: Faces Faces Pain Scale: Hurts a little bit Pain Location: left ankle, left hand, and left elbow with AROM Pain Descriptors / Indicators: Grimacing, Discomfort Pain Intervention(s): Limited activity within patient's tolerance, Repositioned  Home Living Family/patient expects to be discharged  to:: Private residence Living Arrangements: Children Available Help at Discharge: Family;Available 24 hours/day (lives with daughter and grandaughter) Type of Home: House Home Access: Stairs to enter [employer] of  Steps: 2 Entrance Stairs-Rails: Right Home Layout: Multi-level;Able to live on main level with bedroom/bathroom     Bathroom Shower/Tub: Tub/shower unit   Bathroom Toilet: Standard Bathroom Accessibility: Yes   Home Equipment: Rolling Walker (2 wheels);Grab bars - tub/shower;Cane - single point;Wheelchair - manual;BSC/3in1;Hospital bed;Shower seat;Hand held shower head              Frequency  Min 2X/week        Progress Toward Goals  OT Goals(current goals can now be found in the care plan section)  Progress towards OT goals: Progressing toward goals  Acute Rehab OT Goals Patient Stated Goal: To get back to normal OT Goal Formulation: With patient Time For Goal Achievement: 04/08/23 Potential to Achieve Goals: Good  Plan Discharge plan needs to be updated       AM-PAC OT "6 Clicks" Daily Activity     Outcome Measure   Help from another person eating meals?: A Lot Help from another person taking care of personal grooming?: A Lot Help from another person toileting, which includes using toliet, bedpan, or urinal?: Total Help from another person bathing (including washing, rinsing, drying)?: Total Help from another person to put on and taking off regular upper body clothing?: A Lot Help from another person to put on and taking off regular lower body clothing?: Total 6 Click Score: 9    End of Session Equipment Utilized During Treatment: Gait belt  OT Visit Diagnosis: Unsteadiness on feet (R26.81);Other abnormalities of gait and mobility (R26.89);Muscle weakness (generalized) (M62.81);History of falling (Z91.81);Other symptoms and signs involving cognitive function;Hemiplegia and hemiparesis Hemiplegia - Right/Left: Left Hemiplegia - dominant/non-dominant: Non-Dominant Hemiplegia - caused by: Cerebral infarction   Activity Tolerance Patient tolerated treatment well   Patient Left in chair;with call bell/phone within reach;with chair alarm set   Nurse  Communication Need for lift equipment        Time: 1120-1224 OT Time Calculation (min): 64 min  Charges: OT General Charges $OT Visit: 1 Visit OT Evaluation $OT Eval Moderate Complexity: 1 Mod OT Treatments $Self Care/Home Management : 38-52 mins  Perrin Maltese, OTR/L Acute Rehabilitation Services  Office [phone] 03/26/2023

## 2023-03-26 NOTE — Progress Notes (Signed)
STROKE TEAM PROGRESS NOTE   SUBJECTIVE (INTERVAL HISTORY) Her daughter is at the bedside.  Overall her condition is stable. She is lying in bed, lethargic but orientated. Still has left sided weakness. Was on eliquis 2.5 bid at home for PE. However, per daughter pt had PE in 39s and not sure why she is still on that. They moved to [facility] in 2019 and she has been on eliquis for some time.   She follows with Dr. Marjory Lies for PD, and still has frequent falls. And this time she had renal hematoma. Pt had left hand weakness on Monday noon time but was taken action by primary team on Tuesday morning per daughter.    OBJECTIVE Temp:  [98.1 F (36.7 C)-98.7 F (37.1 C)] 98.2 F (36.8 C) (04/17 1200) Pulse Rate:  [63-95] 95 (04/17 1044) Cardiac Rhythm: Normal sinus rhythm (04/17 0854) Resp:  [8-25] 20 (04/17 0854) BP: (108-181)/(63-96) 137/77 (04/17 1044) SpO2:  [92 %-98 %] 95 % (04/17 0854) Weight:  [76.7 kg] 76.7 kg (04/17 0204)  Recent Labs  Lab 03/25/23 2040 03/26/23 0015 03/26/23 0331 03/26/23 0805 03/26/23 1126  GLUCAP 165* 181* 125* 113* 207*   Recent Labs  Lab 03/22/23 1911 03/23/23 0420 03/24/23 0619 03/25/23 0446 03/26/23 0228  NA 139 137 135 132* 135  K 3.8 3.1* 3.9 3.5 3.2*  CL 100 103 107 104 100  CO2 25 22 21* 19* 20*  GLUCOSE 168* 102* 107* 132* 144*  BUN 31* 34* 36* 37* 36*  CREATININE 3.04* 3.45* 3.91* 3.55* 2.95*  CALCIUM 9.4 8.9 8.1* 8.4* 8.4*  MG  --   --   --   --  1.5*   No results for input(s): "AST", "ALT", "ALKPHOS", "BILITOT", "PROT", "ALBUMIN" in the last 168 hours. Recent Labs  Lab 03/22/23 1911 03/23/23 0420 03/24/23 0619 03/25/23 0446 03/26/23 0228  WBC 17.6* 19.7* 22.0* 22.8* 18.2*  HGB 12.0 11.9* 10.5* 11.1* 10.8*  HCT 35.9* 36.2 32.0* 34.0* 31.5*  MCV 90.9 92.3 93.0 92.6 89.5  PLT 261 245 189 205 214   Recent Labs  Lab 03/22/23 1911  CKTOTAL 33*   No results for input(s): "LABPROT", "INR" in the last 72 hours. No results  for input(s): "COLORURINE", "LABSPEC", "PHURINE", "GLUCOSEU", "HGBUR", "BILIRUBINUR", "KETONESUR", "PROTEINUR", "UROBILINOGEN", "NITRITE", "LEUKOCYTESUR" in the last 72 hours.  Invalid input(s): "APPERANCEUR"     Component Value Date/Time   CHOL 66 03/26/2023 0228   TRIG 114 03/26/2023 0228   HDL 29 (L) 03/26/2023 0228   CHOLHDL 2.3 03/26/2023 0228   VLDL 23 03/26/2023 0228   LDLCALC 14 03/26/2023 0228   Lab Results  Component Value Date   HGBA1C 5.2 03/22/2023   No results found for: "LABOPIA", "COCAINSCRNUR", "LABBENZ", "AMPHETMU", "THCU", "LABBARB"  No results for input(s): "ETH" in the last 168 hours.  I have personally reviewed the radiological images below and agree with the radiology interpretations.  VAS US CAROTID  Result Date: 03/26/2023 Carotid Arterial Duplex Study Patient Name:  ALOURA MATSUOKA  Date of Exam:   03/26/2023 Medical Rec #: [id number]         Accession #:    [id number] Date of Birth: [birth year]         Patient Gender: F Patient Age:   72 years Exam Location:  [facility] Procedure:      VAS US CAROTID Referring Phys: Scheryl Marten Shogo Larkey --------------------------------------------------------------------------------  Indications:       CVA and Left sided weakness. Risk Factors:  Hypertension, hyperlipidemia, Diabetes, coronary artery                    disease, prior CVA. Comparison Study:  04/17/20 Performing Technologist: Marilynne Halsted RDMS, RVT  Examination Guidelines: A complete evaluation includes B-mode imaging, spectral Doppler, color Doppler, and power Doppler as needed of all accessible portions of each vessel. Bilateral testing is considered an integral part of a complete examination. Limited examinations for reoccurring indications may be performed as noted.  Right Carotid Findings: +----------+--------+--------+--------+---------------------+------------------+           PSV cm/sEDV cm/sStenosisPlaque Description   Comments            +----------+--------+--------+--------+---------------------+------------------+ CCA Prox  130     18                                                      +----------+--------+--------+--------+---------------------+------------------+ CCA Mid   77      20                                                      +----------+--------+--------+--------+---------------------+------------------+ CCA Distal64      18                                   intimal thickening +----------+--------+--------+--------+---------------------+------------------+ ICA Prox  76      30      1-39%   irregular and        intimal thickening                                   calcific                                +----------+--------+--------+--------+---------------------+------------------+ ICA Mid   106     32                                   tortuous           +----------+--------+--------+--------+---------------------+------------------+ ICA Distal63      25                                                      +----------+--------+--------+--------+---------------------+------------------+ ECA       104     20                                                      +----------+--------+--------+--------+---------------------+------------------+ +----------+--------+-------+----------------+-------------------+           PSV cm/sEDV cmsDescribe        Arm Pressure (mmHG) +----------+--------+-------+----------------+-------------------+ [id number]  Multiphasic, WNL                    +----------+--------+-------+----------------+-------------------+ +---------+--------+--+--------+--+---------+ VertebralPSV cm/s39EDV cm/s17Antegrade +---------+--------+--+--------+--+---------+  Left Carotid Findings: +----------+--------+--------+--------+------------------+------------------+           PSV cm/sEDV cm/sStenosisPlaque DescriptionComments            +----------+--------+--------+--------+------------------+------------------+ CCA Prox  76      20                                                   +----------+--------+--------+--------+------------------+------------------+ CCA Distal51      20                                                   +----------+--------+--------+--------+------------------+------------------+ ICA Prox  68      22      1-39%                     intimal thickening +----------+--------+--------+--------+------------------+------------------+ ICA Mid   67      24                                                   +----------+--------+--------+--------+------------------+------------------+ ICA Distal57      24                                                   +----------+--------+--------+--------+------------------+------------------+ ECA       87      16                                                   +----------+--------+--------+--------+------------------+------------------+ +----------+--------+--------+----------------+-------------------+           PSV cm/sEDV cm/sDescribe        Arm Pressure (mmHG) +----------+--------+--------+----------------+-------------------+ Subclavian182             Multiphasic, WNL                    +----------+--------+--------+----------------+-------------------+ +---------+--------+--+--------+--+---------+ VertebralPSV cm/s32EDV cm/s12Antegrade +---------+--------+--+--------+--+---------+   Summary: Right Carotid: Velocities in the right ICA are consistent with a 1-39% stenosis. Left Carotid: Velocities in the left ICA are consistent with a 1-39% stenosis. Vertebrals:  Bilateral vertebral arteries demonstrate antegrade flow. Subclavians: Normal flow hemodynamics were seen in bilateral subclavian              arteries. *See table(s) above for measurements and observations.     Preliminary    CT HEAD WO CONTRAST ( )  Result Date:  03/26/2023 CLINICAL DATA:  72 year old female status post code stroke presentation yesterday. Left side weakness. Small acute right thalamic hemorrhage. EXAM: CT HEAD WITHOUT CONTRAST TECHNIQUE: Contiguous axial images were obtained from the base of the skull through the vertex without intravenous contrast. RADIATION DOSE REDUCTION: This exam  was performed according to the departmental dose-optimization program which includes automated exposure control, adjustment of the mA and/or kV according to patient size and/or use of iterative reconstruction technique. COMPARISON:  Head CT 03/25/2023. FINDINGS: Brain: Small hyperdense, bilobed intra-axial hemorrhage of the right thalamus is stable from yesterday (sagittal images 23 and 24), still suspected at less than 1 mL volume. Possible regional edema but no significant mass effect. Underlying advanced chronic small vessel disease with confluent hypodensity in the bilateral cerebral white matter and scattered hypodensity in the bilateral deep gray nuclei (left basal ganglia is relatively spared). No IVH or ventriculomegaly.  No new intracranial abnormality. Vascular: Calcified atherosclerosis at the skull base. No suspicious intracranial vascular hyperdensity. Skull: No acute osseous abnormality identified. Sinuses/Orbits: Stable bilateral paranasal sinus mucosal thickening and opacification. Bilateral mastoid opacification is stable. Other: No acute orbit or scalp soft tissue finding. IMPRESSION: 1. Stable small bilobed right thalamic hemorrhage. Possible surrounding edema but no significant mass effect. No intraventricular extension or other complicating features. 2. Underlying advanced small vessel disease. 3. No new intracranial abnormality. Electronically Signed   By: Odessa Fleming M.D.   On: 03/26/2023 06:21   CT HEAD CODE STROKE WO CONTRAST  Result Date: 03/25/2023 CLINICAL DATA:  Code stroke. 72 year old female with left side weakness beginning yesterday. EXAM: CT  HEAD WITHOUT CONTRAST TECHNIQUE: Contiguous axial images were obtained from the base of the skull through the vertex without intravenous contrast. RADIATION DOSE REDUCTION: This exam was performed according to the departmental dose-optimization program which includes automated exposure control, adjustment of the mA and/or kV according to patient size and/or use of iterative reconstruction technique. COMPARISON:  Head CT 06/13/2021. FINDINGS: Brain: Small volume hyperdense hemorrhage in the right thalamus is new, superimposed on chronic bilateral basal ganglia heterogeneity and confluent bilateral cerebral white matter hypodensity. Oval, bilobed right thalamic intra-axial blood products encompassing 12 by 7 by 6 mm (AP by transverse by CC), volume estimated at less than 1 mL. Surrounding hypodensity appears increased since 2022 and is probably in part edema. Other cerebral gray-white matter differentiation does not appear significantly changed from the prior. No intraventricular hemorrhage, ventriculomegaly, intracranial mass effect, acute cortically based infarct. Vascular: Calcified atherosclerosis at the skull base. No suspicious intracranial vascular hyperdensity. Skull: No acute osseous abnormality identified. Chronic hyperostosis. Sinuses/Orbits: Increased inflammatory appearing paranasal sinus opacification and mucosal thickening. No sinus hemorrhage identified. New bilateral mastoid air cell opacification also. Other: No acute orbit or scalp soft tissue finding. ASPECTS [facility] Stroke Program Early CT Score) Total score (0-10 with 10 being normal): Not applicable, small acute thalamic hemorrhage. IMPRESSION: 1. Small acute right thalamic hemorrhage, volume less than 1 mL. Evidence of mild surrounding edema but no significant mass effect or other complicating features. 2. Underlying advanced chronic small vessel disease, otherwise not significantly changed from 2022. Study discussed by telephone with Dr.  Ritta Slot on 03/25/2023 at 10:57 . Electronically Signed   By: Odessa Fleming M.D.   On: 03/25/2023 11:02   DG Wrist Complete Left  Result Date: 03/25/2023 CLINICAL DATA:  86310 Swelling 16109 EXAM: LEFT WRIST - COMPLETE 3+ VIEW COMPARISON:  None Available. FINDINGS: There is no evidence of acute fracture. Alignment is normal. There is mild radiocarpal and base of thumb osteoarthritis. There is radiocarpal, DRUJ, TFCC, intercarpal, CMC, and MCP joint chondrocalcinosis. No definite bony erosions. There is diffuse soft tissue swelling of the wrist. IMPRESSION: No acute osseous abnormality. Diffuse soft tissue swelling of the wrist. Extensive chondrocalcinosis suggestive of CPPD arthropathy.  No evidence of bone erosion. Mild radiocarpal and base of thumb degenerative change. Electronically Signed   By: Caprice Renshaw M.D.   On: 03/25/2023 10:22   CT Renal Stone Study  Result Date: 03/22/2023 CLINICAL DATA:  Abdominal/flank pain, stone suspected EXAM: CT ABDOMEN AND PELVIS WITHOUT CONTRAST TECHNIQUE: Multidetector CT imaging of the abdomen and pelvis was performed following the standard protocol without IV contrast. RADIATION DOSE REDUCTION: This exam was performed according to the departmental dose-optimization program which includes automated exposure control, adjustment of the mA and/or kV according to patient size and/or use of iterative reconstruction technique. COMPARISON:  CT 08/01/2021 FINDINGS: Lower chest: Trace left pleural thickening. Mild dependent atelectasis. Pacemaker wires partially included. Hepatobiliary: Pneumobilia which is intermittently been present on prior exam. Cholecystectomy. No evidence of focal liver lesion. Pancreas: Parenchymal atrophy. No ductal dilatation or inflammation. Spleen: Normal in size without focal abnormality. Adrenals/Urinary Tract: No adrenal nodule. Again seen polycystic appearance of both kidneys with cysts of varying sizes. One of the cysts in the upper pole of  the left kidney is now hyperdense likely representing interval hemorrhage. There is left hydronephrosis that is new from prior exam. Diffusely increased density within the left renal collecting system which has a slightly rounded appearance in the renal pelvis measuring 2.7 cm, series 2, image 39. This is favored to represent hemorrhage and blood clot. Some hyperdense material is seen within the left ureter. No evidence of ureteral stone or obstruction. There is moderate left perinephric edema, with Peri ureteric fat stranding. No right hydronephrosis. No right renal calculi. Partially distended urinary bladder. There is no bladder stone. Stomach/Bowel: No bowel obstruction or inflammation. The appendix is not definitively seen. Vascular/Lymphatic: Aortic atherosclerosis. No aneurysm. There are small left retroperitoneal lymph nodes, likely reactive. Reproductive: Status post hysterectomy. No adnexal masses. Other: No free air or free fluid. No abdominal wall hernia. Musculoskeletal: Degenerative change of the hips and lumbar spine. There are no acute or suspicious osseous abnormalities. IMPRESSION: 1. Left hydronephrosis with diffuse increased density within the left renal collecting system and ureter, which has a rounded appearance in the renal pelvis. This is favored to represent hemorrhage and blood clot. The possibility of infection is also considered. No evidence of ureteral stone or obstruction. Recommend follow-up after course of treatment to ensure resolution, as renal collecting system mass is not entirely excluded. 2. Polycystic appearance of both kidneys with cysts of varying sizes. One of the cysts in the upper pole of the left kidney is now hyperdense, likely representing interval hemorrhage. Aortic Atherosclerosis (ICD10-I70.0). Electronically Signed   By: Narda Rutherford M.D.   On: 03/22/2023 19:25     PHYSICAL EXAM  Temp:  [98.1 F (36.7 C)-98.7 F (37.1 C)] 98.2 F (36.8 C) (04/17  1200) Pulse Rate:  [63-95] 95 (04/17 1044) Resp:  [8-25] 20 (04/17 0854) BP: (108-181)/(63-96) 137/77 (04/17 1044) SpO2:  [92 %-98 %] 95 % (04/17 0854) Weight:  [76.7 kg] 76.7 kg (04/17 0204)  General - Well nourished, well developed, in no apparent distress, lethargic.  Ophthalmologic - fundi not visualized due to noncooperation.  Cardiovascular - Regular rhythm and rate.  Neuro - awake, alert, eyes open, but lethargic, orientated to age, place, year and people but not to month. No aphasia, however, paucity of speech, able to answer questions use words not sentences, following all simple commands. Able to name and repeat. Mild dysarthria. Significant psychomotor slowing. No gaze palsy, tracking bilaterally, visual field full, PERRL. No significant facial droop. Tongue  midline. RUE 4/5 without drift, LUE 2+/5 proximal and 2/5 distal hand movement. Significant pain with passive movement of left arm. RLE 3/5 proximal and distal, LLE 2+/5 proximal and barely movement of toes. Sensation symmetrical bilaterally subjectively, right FTN no ataxic but not able to touch nose, gait not tested. R hand mild resting tremor.    ASSESSMENT/PLAN Ms. Melissa Lopez is a 72 y.o. female with history of CKD 4, CAD 2019, hypertension, pacemaker, SSS status post pacemaker, diabetes, history of PE on Eliquis, Parkinson disease with frequent falls admitted for AKI, left hydronephrosis with hematuria, and renal pelvis hematoma. No tPA given due to ICH.    ICH:  right thalamic ICH, secondary to hypertension in setting of Eliquis use CT head right thalamic small ICH CT repeat 4/17 stable small thalamic ICH MRI pending in a.m. MRA pending in a.m. Carotid Doppler unremarkable 2D Echo EF 65 to 70% in 02/2022 Pacemaker irrigation pending LDL 14 HgbA1c 5.2 SCDs for VTE prophylaxis Eliquis (apixaban) 2.5 twice daily and aspirin 81 prior to admission, now on no antithrombotics given ICH and renal hematoma.  Ongoing  aggressive stroke risk factor management Therapy recommendations: Pending Disposition: Pending  Renal pelvis hematoma due to fall Hematuria AKI on CKD with left hydronephrosis UTI CT renal study Left hydronephrosis with diffuse increased density within the left renal collecting system and ureter, which has a rounded appearance in the renal pelvis. This is favored to represent hemorrhage and blood clot.  UA WBC 6-10, RBC >50 On Rocephin Creatinine 3.04-3.45-3.91-3.55 On sodium bicarb 650 TID CCM on board  History of PE on Eliquis Per daughter PE happened in 76s On low-dose Eliquis 2.5 twice daily PTA Not sure if patient still needed anticoagulation for history of PE Consider stop Eliquis in the future if not needed given history of ICH and present disease with frequent falls  Parkinson disease with frequent falls Resume home Sinemet Fall precaution Follow-up with [facility] Dr. Marjory Lies Continue outpatient follow-up  Diabetes HgbA1c 5.2 goal < 7.0 Controlled CBG monitoring SSI DM education and close PCP follow up  Hypertension Unstable On Cleviprex, wean off as able BP goal less than 160 Now on Coreg 12.5 twice daily, hydralazine 50 3 times daily Long term BP goal normotensive  Hyperlipidemia Home meds: Crestor 40 LDL 14, goal < 70 Now on Crestor 20 due to low LDL Continue statin at discharge  Other Stroke Risk Factors Advanced age Hx stroke/TIA, details unclear Coronary artery disease 2019  Other Active Problems SSS status post pacemaker  Hospital day # 4  This patient is critically ill due to right thalamic ICH, renal pelvis hematoma, frequent falls, AKI on CKD, history of PE and at significant risk of neurological worsening, death form hematoma expansion, cerebral edema, heart failure, renal failure. This patient's care requires constant monitoring of vital signs, hemodynamics, respiratory and cardiac monitoring, review of multiple databases, neurological  assessment, discussion with family, other specialists and medical decision making of high complexity. I spent 40 minutes of neurocritical care time in the care of this patient. I had long discussion with daughter at bedside, updated pt current condition, treatment plan and potential prognosis, and answered all the questions.  She expressed understanding and appreciation.    Marvel Plan, MD PhD Stroke Neurology 03/26/2023 12:10 PM    To contact Stroke Continuity provider, please refer to [email or web address]. After hours, contact General Neurology

## 2023-03-26 NOTE — Progress Notes (Signed)
eLink Physician-Brief Progress Note Patient Name: Melissa Lopez DOB: 1951-01-28 MRN: [id number]   Date of Service  03/26/2023  HPI/Events of Note  K+ 3.2, Cr 2.95  eICU Interventions  KCL 20 meq  po Q 4 hours x 2 doses.        Thomasene Lot Moyses Pavey 03/26/2023, 4:36 AM

## 2023-03-26 NOTE — Progress Notes (Addendum)
NAME:  Melissa Lopez, MRN:  [id number], DOB:  [birth year], LOS: 4 ADMISSION DATE:  03/22/2023, CONSULTATION DATE:  4/16 REFERRING MD:  Dr. Nelson Chimes, CHIEF COMPLAINT:  hematuria; now w/ R thalamic bleed   History of Present Illness:  Patient is a 72 year old female with pertinent PMH CKD stage III, hx of PE 2019 on eliquis, prior CVA R thalamic lacunar stroke, CAD, HTN, T2DM w/ gastroparesis, sick sinus syndrome s/p PPM placement, primary hyperparathyroidism, parkinsons disease presents to [facility] ED on 4/13 w/ hematuria.  Patient has frequent history of multiple falls secondary to Parkinson's disease, denies any left flank pain.  States she had about 3 falls in the past week.  Her last fall she was down for about 4 hours. Is on eliquis w/ hx of PE. Patient states she started having dark-colored urine and dysuria on 4/13 and came to [facility] ED for further eval.  Patient denies fever, N/V.  ED workup significant for hematuria and AKI with creat 3.04.  CT renal showing left hydronephrosis and blood clot in renal pelvis.  Patient admitted to [facility] Urology and nephrology consulted and do not recommend stenting at this time.  Patient started empirically on Rocephin for possible UTI.  Eliquis held.  IV fluids given.  On 4/15 patient complaining of some left-sided weakness thought to be attributed by left sided wrist pain. Patient was working with PT and noted patient to have leaning toward the left side and code stroke was activated.  CT head showing acute right thalamic hemorrhage.  Neuro consulted thought related to HPI and etiology.  Patient started on Cleviprex.  Plan to transfer to [facility] ICU.  PCCM consulted.  Pertinent  Medical History   Past Medical History:  Diagnosis Date   CKD (chronic kidney disease) stage 3, GFR 30-59 ml/min 10/31/2018   - per patient report   Coronary artery disease    per patient report - 2 caths with non-obstructive CAD   Essential hypertension    Frequent falls    H/O cardiac  pacemaker 10/31/2018   implanted 05/2010   Sick sinus syndrome    Type 2 diabetes mellitus with complication, with long-term current use of insulin 10/31/2018   with diabetic nephropathy     Significant Hospital Events: Including procedures, antibiotic start and stop dates in addition to other pertinent events   4/13 admitted to [facility] w/ hematuria; CT renal showing left hydronephrosis and blood clot in renal pelvis.  4/16 code stroke; CT head showing acute right thalamic hemorrhage; started on Cleviprex; transferred to [facility]  Interim History / Subjective:  On 4 cleviprex Patient Aox3; weak to MAE but very limited mvt on LUE  Objective   Blood pressure 129/71, pulse 74, temperature 98.6 F (37 C), temperature source Oral, resp. rate 11, height  (1.6 m), weight 76.7 kg, SpO2 93 %.        Intake/Output Summary (Last 24 hours) at 03/26/2023 0713 Last data filed at 03/26/2023 0600 Gross per 24 hour  Intake 3317.41 ml  Output 480 ml  Net 2837.41 ml    Filed Weights   03/22/23 2300 03/25/23 1127 03/26/23 0204  Weight: 75.5 kg 71.9 kg 76.7 kg    Examination: General: elderly female in NAD HEENT: MM pink/moist Neuro: Aox3; weak to MAE but very limited mvt on LUE; PERRL CV: s1s2, RRR, no m/r/g PULM:  dim clear BS bilaterally; on room air GI: soft, bsx4 active  GU: pure wick in place; urine appears more yellow/clear  Extremities: warm/dry, Left  wrist w/ swelling and some warmth/pain upon palpation Skin: no rashes or lesions    Resolved Hospital Problem list     Assessment & Plan:  Acute right thalamic hemorrhage Hx of prior CVA R thalamic lacunar stroke Plan: -Admit to ICU -Neuro following; appreciate recs -goal to wean Cleviprex off today; SBP goal 130-150 -cont coreg and hydralazine; consider increasing dose if unable to wean off cleviprex -Repeat imaging per neuro -frequent neuro checks -limit sedating meds -LDL 14; will hold statin -hold DAPT and AC -Continue  neuroprotective measures- normothermia, euglycemia, HOB greater than 30, head in neutral alignment, normocapnia, normoxia.  -PT/OT/SLP  AKI on CKD stage IIIb Left hydronephrosis Gross hematuria PCKD: concern for hemorrhage of cysts Plan: -Urology/nephrology recommend supportive care for now and follow-up renal US in 2 weeks and will need to f/u with urology/nephrology in 2 weeks -cont hold AC given head bleed -Trend BMP / urinary output -Replace electrolytes as indicated -Avoid nephrotoxic agents, ensure adequate renal perfusion  Hypokalemia Plan: -K repleted -check mag and bmp and replete electrolytes as needed  UTI w/ possible pyelonephritis Plan: -cont rocephin x 7days  Hx of PE 2019 on Eliquis Plan: -hold AC -scds dvt ppx  SSS s/p PPM: followed by Dr. Ladona Ridgel Plan: -Telemetry monitoring  CAD HTN Chronic diastolic CHF Atrophic cardiomyopathy HCM Plan: -wean cleviprex as above -consider increasing home coreg and cont hydralazine to help wean off cleviprex -hold statin -hold asa -daily weights; strict I/o's  Left wrist pseudogout Plan: -prn tylenol for pain -consider steroids as anti-inflammatory if still having pain to help avoid the use of sedating pain meds; will avoid nsaids with AKI  T2DM w/ gastroparesis Peripheral neuropathy Plan: -resume ssi and cbg monitoring -resume gabapentin at half dose to avoid withdrawal -resume home reglan  Parkinson disease w/ falls Plan: -PT/OT -resume home flexeril -cont sinemet IR  Hyperparathyroidism Plan: -resume sensipar  Depression Plan: -qtc 0.38 -resume home prozac and remeron  Best Practice (right click and "Reselect all SmartList Selections" daily)   Diet/type: clear liquids DVT prophylaxis: SCD GI prophylaxis: N/A Lines: N/A Foley:  N/A Code Status:  full code Last date of multidisciplinary goals of care discussion [4;17 updated daughter and patient at bedside]  Labs   CBC: Recent Labs   Lab 03/22/23 1911 03/23/23 0420 03/24/23 0619 03/25/23 0446 03/26/23 0228  WBC 17.6* 19.7* 22.0* 22.8* 18.2*  HGB 12.0 11.9* 10.5* 11.1* 10.8*  HCT 35.9* 36.2 32.0* 34.0* 31.5*  MCV 90.9 92.3 93.0 92.6 89.5  PLT 261 245 189 205 214     Basic Metabolic Panel: Recent Labs  Lab 03/22/23 1911 03/23/23 0420 03/24/23 0619 03/25/23 0446 03/26/23 0228  NA 139 137 135 132* 135  K 3.8 3.1* 3.9 3.5 3.2*  CL 100 103 107 104 100  CO2 25 22 21* 19* 20*  GLUCOSE 168* 102* 107* 132* 144*  BUN 31* 34* 36* 37* 36*  CREATININE 3.04* 3.45* 3.91* 3.55* 2.95*  CALCIUM 9.4 8.9 8.1* 8.4* 8.4*    GFR: Estimated Creatinine Clearance: 17.1 mL/min (A) (by C-G formula based on SCr of 2.95 mg/dL (H)). Recent Labs  Lab 03/23/23 0420 03/24/23 0619 03/25/23 0446 03/26/23 0228  WBC 19.7* 22.0* 22.8* 18.2*     Liver Function Tests: No results for input(s): "AST", "ALT", "ALKPHOS", "BILITOT", "PROT", "ALBUMIN" in the last 168 hours. No results for input(s): "LIPASE", "AMYLASE" in the last 168 hours. No results for input(s): "AMMONIA" in the last 168 hours.  ABG    Component Value  Date/Time   TCO2 29 08/01/2021 1304     Coagulation Profile: No results for input(s): "INR", "PROTIME" in the last 168 hours.  Cardiac Enzymes: Recent Labs  Lab 03/22/23 1911  CKTOTAL 33*     HbA1C: Hgb A1c MFr Bld  Date/Time Value Ref Range Status  03/22/2023 07:11 PM 5.2 4.8 - 5.6 % Final    Comment:    (NOTE) Pre diabetes:          5.7%-6.4%  Diabetes:              >6.4%  Glycemic control for   <7.0% adults with diabetes   08/02/2021 05:00 AM 5.5 4.8 - 5.6 % Final    Comment:    (NOTE) Pre diabetes:          5.7%-6.4%  Diabetes:              >6.4%  Glycemic control for   <7.0% adults with diabetes     CBG: Recent Labs  Lab 03/25/23 0728 03/25/23 1132 03/25/23 2040 03/26/23 0015 03/26/23 0331  GLUCAP 168* 187* 165* 181* 125*      Past Medical History:  She,  has a past  medical history of CKD (chronic kidney disease) stage 3, GFR 30-59 ml/min (10/31/2018), Coronary artery disease, Essential hypertension, Frequent falls, H/O cardiac pacemaker (10/31/2018), Sick sinus syndrome, and Type 2 diabetes mellitus with complication, with long-term current use of insulin (10/31/2018).   Surgical History:   Past Surgical History:  Procedure Laterality Date   BIOPSY  09/28/2020   Procedure: BIOPSY;  Surgeon: Bernette Redbird, MD;  Location: [facility] ENDOSCOPY;  Service: Endoscopy;;   BIOPSY  08/03/2021   Procedure: BIOPSY;  Surgeon: Corbin Ade, MD;  Location: [facility];  Service: Endoscopy;;   CARDIAC PACEMAKER PLACEMENT  2008   in [place] ([facility]) for sick sinus syndrome   CHOLECYSTECTOMY     CORONARY/GRAFT ACUTE MI REVASCULARIZATION N/A 11/09/2020   Procedure: CORONARY/GRAFT ACUTE MI REVASCULARIZATION;  Surgeon: Marykay Lex, MD;  Location: [facility] INVASIVE CV LAB;  Service: Cardiovascular;  Laterality: N/A;   ESOPHAGOGASTRODUODENOSCOPY N/A 09/28/2020   Procedure: ESOPHAGOGASTRODUODENOSCOPY (EGD);  Surgeon: Bernette Redbird, MD;  Location: [facility];  Service: Endoscopy;  Laterality: N/A;   ESOPHAGOGASTRODUODENOSCOPY (EGD) WITH PROPOFOL N/A 11/10/2018   Procedure: ESOPHAGOGASTRODUODENOSCOPY (EGD) WITH PROPOFOL;  Surgeon: Charlott Rakes, MD;  Location: [facility] ENDOSCOPY;  Service: Endoscopy;  Laterality: N/A;   ESOPHAGOGASTRODUODENOSCOPY (EGD) WITH PROPOFOL N/A 08/03/2021   severe reflux esophagitis, bile gastritis, felt to have delayed gastric emptying. Pathology from esophageal biopsies with fibrinopurulent exudate and granulation tissue consistent with ulcer base   LEFT HEART CATH AND CORONARY ANGIOGRAPHY N/A 10/31/2018   Procedure: LEFT HEART CATH AND CORONARY ANGIOGRAPHY;  Surgeon: Marykay Lex, MD;  Location: [facility] INVASIVE CV LAB;  Service: Cardiovascular;  Laterality: N/A;   PACEMAKER GENERATOR CHANGE  09/2018   in [place] ([facility])   REPLACEMENT TOTAL KNEE  BILATERAL       Social History:   reports that she quit smoking about 53 years ago. Her smoking use included cigarettes. She has never used smokeless tobacco. She reports that she does not currently use alcohol. She reports that she does not currently use drugs.   Family History:  Her family history includes Bone cancer in her mother; Breast cancer in her mother and sister; Diabetes in her brother; Heart attack (age of onset: 16) in her father; Heart disease in her brother; Other in her sister. There is no history  of Colon cancer or Colon polyps.   Allergies Allergies  Allergen Reactions   Contrast Media [Iodinated Contrast Media] Swelling    Contrast Dye - the one made out of shellfish, gives a really bad reaction - swelling   Oxycodone-Acetaminophen Anaphylaxis   Xarelto  [Rivaroxaban] Swelling   Oxycodone Other (See Comments)    Statused by Person:  EAST, CAMELIA(CE) on [id number]     Home Medications  Prior to Admission medications   Medication Sig Start Date End Date Taking? Authorizing Provider  acetaminophen (TYLENOL) 325 MG tablet Take 650 mg by mouth in the morning, at noon, and at bedtime.   Yes [provider]  apixaban (ELIQUIS) 2.5 MG TABS tablet Take 2.5 mg by mouth 2 (two) times daily.   Yes [provider]  aspirin 81 MG chewable tablet Chew 1 tablet (81 mg total) by mouth daily. 11/13/18  Yes Noralee Stain, DO  carbidopa-levodopa (SINEMET IR) 25-100 MG tablet Take 1 tablet by mouth 3 (three) times daily before meals. 09/23/22  Yes Penumalli, Glenford Bayley, MD  carvedilol (COREG) 6.25 MG tablet Take 6.25 mg by mouth 2 (two) times daily with a meal.   Yes [provider]  cinacalcet (SENSIPAR) 30 MG tablet Take 1 tablet (30 mg total) by mouth 2 (two) times daily with a meal. Patient taking differently: Take 45 mg by mouth in the morning. 06/18/21  Yes Gardenia Phlegm, MD  cyclobenzaprine (FLEXERIL) 5 MG tablet Take 5 mg by mouth at bedtime.   Yes  [provider]  FLUoxetine (PROZAC) 20 MG tablet Take 40 mg by mouth at bedtime.   Yes [provider]  gabapentin (NEURONTIN) 400 MG capsule Take 400 mg by mouth at bedtime.   Yes [provider]  hydrALAZINE (APRESOLINE) 50 MG tablet Take 50 mg by mouth 3 (three) times daily.   Yes [provider]  HYDROcodone-acetaminophen (NORCO/VICODIN) 5-325 MG tablet Take 1 tablet by mouth every 6 (six) hours as needed for moderate pain. Patient taking differently: Take 1 tablet by mouth 2 (two) times daily. 08/07/21  Yes Tat, Onalee Hua, MD  lidocaine (LIDODERM) 5 % Place 2 patches onto the skin daily as needed (pain (hip & back)). (Apply hip and back )Remove & Discard patch within 12 hours or as directed by MD   Yes [provider]  Magnesium 400 MG CAPS Take 400 mg by mouth daily.   Yes [provider]  Menthol, Topical Analgesic, (BIOFREEZE EX) Apply 1 application topically 3 (three) times daily as needed (pain.).   Yes [provider]  metoCLOPramide (REGLAN) 10 MG tablet Take 10 mg by mouth in the morning and at bedtime.   Yes [provider]  mirtazapine (REMERON) 7.5 MG tablet Take 7.5 mg by mouth at bedtime.   Yes [provider]  Nutritional Supplements (,FEEDING SUPPLEMENT, PROSOURCE PLUS) liquid Take 30 mLs by mouth daily. 09/29/20  Yes Hongalgi, Maximino Greenland, MD  rosuvastatin (CRESTOR) 40 MG tablet Take 40 mg by mouth in the morning.   Yes [provider]  senna-docusate (SENOKOT-S) 8.6-50 MG tablet Take 2 tablets by mouth 2 (two) times daily.   Yes [provider]  Skin Protectants, Misc. (MINERIN CREME EX) Apply topically daily.   Yes [provider]  traZODone (DESYREL) 50 MG tablet Take 50 mg by mouth at bedtime.   Yes [provider]  UNKNOWN TO PATIENT Place 1-2 sprays into the nose daily. For Degenerative Disc Disease   Yes [provider]  polyethylene glycol (MIRALAX /  GLYCOLAX) 17 g packet Take 17 g by mouth 2 (two) times daily. Patient not taking: Reported on 03/22/2023 08/07/21   Catarina Hartshorn, MD     Critical care time: 45 minutes    JD Anselm Lis [facility] 03/26/2023, 7:13 AM  Please see [email or web address] for pager details.  From 7A-7P if no response, please call [phone]. After hours, please call ELink [phone].

## 2023-03-26 NOTE — Evaluation (Addendum)
Physical Therapy Re-Evaluation Patient Details Name: Melissa Lopez MRN: [id number] DOB: 1951/07/22 Today's Date: 03/26/2023  History of Present Illness  Melissa Lopez is a 72 y.o. female who presents to ED at [facility] secondary to hematuriawith recent history of multiple falls, secondary to her Parkinson disease, she does report left flank pain, hematuria.  Increased left weakness reported with therapy on 4/16 with positive CT scan noted for right acute thalamic hemorrage.  She was then transferred to [facility] for closer monitoring.  PMH of CKD stage III, coronary artery disease, sick sinus syndrome status post permanent pacemaker, primary hyperparathyroidism, diabetes mellitus with gastroparesis, hypertension, and history of Parkinson disease.  Clinical Impression  Patient progressing back to bed with stand step with RW after up with OT via squat pivot.  With PT and daughter support able to step using RW to bed, though remains weaker than on initial evaluation with L hand swelling and pain and limited ability to stand erect with hip extension.  She followed commands but slowly and still needs assist for initiation with increased time and multimodal cues  Feel she may need post-acute inpatient rehab prior to d/c home. Daughter will be able to provide assist at d/c.  PT will continue to follow.        Recommendations for follow up therapy are one component of a multi-disciplinary discharge planning process, led by the attending physician.  Recommendations may be updated based on patient status, additional functional criteria and insurance authorization.  Follow Up Recommendations Can patient physically be transported by private vehicle: No     Assistance Recommended at Discharge Frequent or constant Supervision/Assistance  Patient can return home with the following  A lot of help with bathing/dressing/bathroom;A lot of help with walking and/or transfers;Help with stairs or ramp for  entrance;Assistance with cooking/housework    Equipment Recommendations None recommended by PT  Recommendations for Other Services       Functional Status Assessment Patient has had a recent decline in their functional status and demonstrates the ability to make significant improvements in function in a reasonable and predictable amount of time.     Precautions / Restrictions Precautions Precautions: Fall Precaution Comments: Hx of Parkinson's; painful L hand Restrictions Weight Bearing Restrictions: No      Mobility  Bed Mobility Overal bed mobility: Needs Assistance Bed Mobility: Sit to Supine       Sit to supine: Mod assist   General bed mobility comments: assist to get legs into bed and cues for scooting hips    Transfers Overall transfer level: Needs assistance Equipment used: Rolling walker (2 wheels) Transfers: Sit to/from Stand Sit to Stand: Max assist, +2 physical assistance   Step pivot transfers: Max assist, +2 physical assistance       General transfer comment: stood to RW after two failed attempts wtih +1 A then daughter helped for +2 assist to stand though flexed at hips and support behind hips for balance, scooting steps to move hips around and sit on EOB using RW    Ambulation/Gait                  Stairs            Wheelchair Mobility    Modified Rankin (Stroke Patients Only) Modified Rankin (Stroke Patients Only) Pre-Morbid Rankin Score: Moderately severe disability Modified Rankin: Severe disability     Balance Overall balance assessment: Needs assistance   Sitting balance-Leahy Scale: Poor Sitting balance - Comments: heavy R UE support to  pull up to sit on edge of chair, then falls back without UE support     Standing balance-Leahy Scale: Zero Standing balance comment: +2 A to stand with RW                             Pertinent Vitals/Pain Pain Assessment Faces Pain Scale: Hurts little more Pain  Location: L hand Pain Descriptors / Indicators: Grimacing, Discomfort Pain Intervention(s): Monitored during session, Repositioned, Limited activity within patient's tolerance    Home Living Family/patient expects to be discharged to:: Private residence Living Arrangements: Children Available Help at Discharge: Family;Available 24 hours/day Type of Home: House Home Access: Stairs to enter Entrance Stairs-Rails: Right Entrance Stairs-Number of Steps: 2   Home Layout: Multi-level;Able to live on main level with bedroom/bathroom Home Equipment: Rolling Walker (2 wheels);Grab bars - tub/shower;Cane - single point;Wheelchair - manual;BSC/3in1;Hospital bed;Shower seat;Hand held shower head      Prior Function Prior Level of Function : Needs assist       Physical Assist : Mobility (physical);ADLs (physical) Mobility (physical): Bed mobility;Transfers;Gait;Stairs ADLs (physical): Bathing;Dressing;Toileting;IADLs Mobility Comments: [place] ambulation with RW per pt report. ADLs Comments: Assisted by family "a little" for bathing, dressing, and toileting. Able to feed and groom herself; aides at [facility] assist     Hand Dominance   Dominant Hand: Right    Extremity/Trunk Assessment   Upper Extremity Assessment Upper Extremity Assessment: Defer to OT evaluation RUE Deficits / Details: Shoulder flexion AROM 0-approximatley 50 degrees, able to flex elbow actively to bring hand to mouth with increased time but unable to position hand on therapists shoulder during standing or transfer.  Gross grasp and release present with decreased overall FM coordination. RUE Coordination: decreased gross motor;decreased fine motor LUE Deficits / Details: Limited AROM throughout. Increased pain and swelling noted at the wrist and hand.   Unable to lift arm up at the shoulder when cued to do so, PROM WFLs for shoulder flexion with trace shoulder activation noted against gravity.  Elbow 2-/5 with pain noted  during AAROM, grip 2/5 grossly.  Pt when asked to complete a movement would state "I can't" but would then be able to exhibit improved movement after PROM or AAROM was completed. LUE: Unable to fully assess due to pain LUE Coordination: decreased fine motor;decreased gross motor    Lower Extremity Assessment Lower Extremity Assessment: RLE deficits/detail;LLE deficits/detail RLE Deficits / Details: AROM grossly WFL except ankle DF limited; strength hip flexion 3+/5, knee extension 4/5; ankle DF 2+/5 LLE Deficits / Details: AROM grossly WFL except ankle DF limited; strength hip flexion 3+/5, knee extension 4/5; ankle DF 2+/5    Cervical / Trunk Assessment Cervical / Trunk Assessment: Other exceptions Cervical / Trunk Exceptions: Head with left lateral and forward flexion.  Increased tightness with attempted PROM left lateral flexion with inability for patient to actively laterally flex to midline and hold.  Communication   Communication: No difficulties  Cognition Arousal/Alertness: Awake/alert Behavior During Therapy: Flat affect Overall Cognitive Status: History of cognitive impairments - at baseline                                          General Comments General comments (skin integrity, edema, etc.): daughter present and assisting hands on with transfer.    Exercises     Assessment/Plan    PT  Assessment Patient needs continued PT services  PT Problem List Decreased strength;Decreased balance;Decreased mobility;Decreased activity tolerance;Decreased knowledge of use of DME;Cardiopulmonary status limiting activity;Decreased knowledge of precautions       PT Treatment Interventions DME instruction;Functional mobility training;Balance training;Patient/family education;Therapeutic activities;Gait training;Therapeutic exercise    PT Goals (Current goals can be found in the Care Plan section)  Acute Rehab PT Goals Patient Stated Goal: agreeable to rehab (not  SNF) PT Goal Formulation: With patient/family Time For Goal Achievement: 04/09/23 Potential to Achieve Goals: Fair    Frequency Min 4X/week     Co-evaluation               AM-PAC PT "6 Clicks" Mobility  Outcome Measure Help needed turning from your back to your side while in a flat bed without using bedrails?: A Lot Help needed moving from lying on your back to sitting on the side of a flat bed without using bedrails?: Total Help needed moving to and from a bed to a chair (including a wheelchair)?: Total Help needed standing up from a chair using your arms (e.g., wheelchair or bedside chair)?: Total Help needed to walk in hospital room?: Total Help needed climbing 3-5 steps with a railing? : Total 6 Click Score: 7    End of Session Equipment Utilized During Treatment: Gait belt Activity Tolerance: Patient limited by pain Patient left: in bed;with call bell/phone within reach;with family/visitor present   PT Visit Diagnosis: Other abnormalities of gait and mobility (R26.89);Muscle weakness (generalized) (M62.81);History of falling (Z91.81);Pain Pain - Right/Left: Left Pain - part of body: Hand    Time: 1250-1317 PT Time Calculation (min) (ACUTE ONLY): 27 min   Charges:   PT Evaluation $PT Re-evaluation: 1 Re-eval PT Treatments $Therapeutic Activity: 8-22 mins        Sheran Lawless, PT Acute Rehabilitation Services Office:[phone] 03/26/2023   Elray Mcgregor 03/26/2023, 2:59 PM

## 2023-03-26 NOTE — Evaluation (Signed)
Clinical/Bedside Swallow Evaluation Patient Details  Name: Melissa Lopez MRN: [id number] Date of Birth: May 24, 1951  Today's Date: 03/26/2023 Time: SLP Start Time (ACUTE ONLY): 1000 SLP Stop Time (ACUTE ONLY): 1023 SLP Time Calculation (min) (ACUTE ONLY): 23 min  Past Medical History:  Past Medical History:  Diagnosis Date   CKD (chronic kidney disease) stage 3, GFR 30-59 ml/min 10/31/2018   - per patient report   Coronary artery disease    per patient report - 2 caths with non-obstructive CAD   Essential hypertension    Frequent falls    H/O cardiac pacemaker 10/31/2018   implanted 05/2010   Sick sinus syndrome    Type 2 diabetes mellitus with complication, with long-term current use of insulin 10/31/2018   with diabetic nephropathy   Past Surgical History:  Past Surgical History:  Procedure Laterality Date   BIOPSY  09/28/2020   Procedure: BIOPSY;  Surgeon: Bernette Redbird, MD;  Location: [facility];  Service: Endoscopy;;   BIOPSY  08/03/2021   Procedure: BIOPSY;  Surgeon: Corbin Ade, MD;  Location: [facility];  Service: Endoscopy;;   CARDIAC PACEMAKER PLACEMENT  2008   in [place] ([facility]) for sick sinus syndrome   CHOLECYSTECTOMY     CORONARY/GRAFT ACUTE MI REVASCULARIZATION N/A 11/09/2020   Procedure: CORONARY/GRAFT ACUTE MI REVASCULARIZATION;  Surgeon: Marykay Lex, MD;  Location: [facility] INVASIVE CV LAB;  Service: Cardiovascular;  Laterality: N/A;   ESOPHAGOGASTRODUODENOSCOPY N/A 09/28/2020   Procedure: ESOPHAGOGASTRODUODENOSCOPY (EGD);  Surgeon: Bernette Redbird, MD;  Location: [facility];  Service: Endoscopy;  Laterality: N/A;   ESOPHAGOGASTRODUODENOSCOPY (EGD) WITH PROPOFOL N/A 11/10/2018   Procedure: ESOPHAGOGASTRODUODENOSCOPY (EGD) WITH PROPOFOL;  Surgeon: Charlott Rakes, MD;  Location: [facility] ENDOSCOPY;  Service: Endoscopy;  Laterality: N/A;   ESOPHAGOGASTRODUODENOSCOPY (EGD) WITH PROPOFOL N/A 08/03/2021   severe reflux esophagitis, bile gastritis, felt to  have delayed gastric emptying. Pathology from esophageal biopsies with fibrinopurulent exudate and granulation tissue consistent with ulcer base   LEFT HEART CATH AND CORONARY ANGIOGRAPHY N/A 10/31/2018   Procedure: LEFT HEART CATH AND CORONARY ANGIOGRAPHY;  Surgeon: Marykay Lex, MD;  Location: [facility] INVASIVE CV LAB;  Service: Cardiovascular;  Laterality: N/A;   PACEMAKER GENERATOR CHANGE  09/2018   in [place] ([facility])   REPLACEMENT TOTAL KNEE BILATERAL     HPI:  Pt is a 72 y.o. female who presented hematuria. Pt found to have AKI. On 4/16 left-sided weakness was noted by PT and code stroke was called. CT head 4/16: Small acute right thalamic hemorrhage, volume less than 1 mL. Mild surrounding edema but no significant mass effect or other complicating features. PMH: dementia CKD stage III, coronary artery disease, sick sinus syndrome status post permanent pacemaker, primary hyperparathyroidism, diabetes mellitus with gastroparesis, hypertension, Parkinson disease.    Assessment / Plan / Recommendation  Clinical Impression  Pt was seen for bedside swallow evaluation with her daughter present. She denied the pt having a history of oropharyngeal dysphagia; however, she stated that the pt is awaiting dentures and therefore avoids very hard foods. Dentition was limited to three mandibular teeth and pt does not wear dentures. Pt presented with symptoms of oral phase dysphagia characterized by prolonged mastication and mild lingual residue. Pt reported that mastication was harder due to the consistency of the food, but pt's daughter stated that she would typically be able to masticate peaches with less difficulty/effort. It is recommended that the pt's current diet of regular texture solids and thin liquids be continued to maximize her options,  but SLP will follow briefly to ensure tolerance. SLP Visit Diagnosis: Dysphagia, unspecified (R13.10)    Aspiration Risk  Mild aspiration risk    Diet  Recommendation Regular;Thin liquid   Liquid Administration via: Straw;Cup Medication Administration: Whole meds with liquid Supervision: Staff to assist with self feeding Compensations: Slow rate Postural Changes: Seated upright at 90 degrees    Other  Recommendations Oral Care Recommendations: Oral care BID    Recommendations for follow up therapy are one component of a multi-disciplinary discharge planning process, led by the attending physician.  Recommendations may be updated based on patient status, additional functional criteria and insurance authorization.  Follow up Recommendations  (TBD)      Assistance Recommended at Discharge    Functional Status Assessment Patient has had a recent decline in their functional status and demonstrates the ability to make significant improvements in function in a reasonable and predictable amount of time.  Frequency and Duration min 1 x/week  1 week       Prognosis        Swallow Study   General Date of Onset: 03/25/23 HPI: Pt is a 72 y.o. female who presented hematuria. Pt found to have AKI. On 4/16 left-sided weakness was noted by PT and code stroke was called. CT head 4/16: Small acute right thalamic hemorrhage, volume less than 1 mL. Mild surrounding edema but no significant mass effect or other complicating features. PMH: dementia CKD stage III, coronary artery disease, sick sinus syndrome status post permanent pacemaker, primary hyperparathyroidism, diabetes mellitus with gastroparesis, hypertension, Parkinson disease. Type of Study: Bedside Swallow Evaluation Previous Swallow Assessment: none Diet Prior to this Study: Regular;Thin liquids (Level 0) Temperature Spikes Noted: N/A Respiratory Status: Room air History of Recent Intubation: No Behavior/Cognition: Cooperative;Alert;Pleasant mood Oral Cavity Assessment: Within Functional Limits Oral Care Completed by SLP: No Oral Cavity - Dentition: Missing dentition (three) Vision:  Functional for self-feeding Self-Feeding Abilities: Needs assist Patient Positioning: Upright in bed;Postural control adequate for testing Baseline Vocal Quality: Low vocal intensity Volitional Swallow: Able to elicit    Oral/Motor/Sensory Function Overall Oral Motor/Sensory Function: Generalized oral weakness   Ice Chips Ice chips: Not tested   Thin Liquid Thin Liquid: Within functional limits Presentation: Straw    Nectar Thick Nectar Thick Liquid: Not tested   Honey Thick Honey Thick Liquid: Not tested   Puree Puree: Not tested   Solid     Solid: Impaired Presentation: Self Fed Oral Phase Impairments: Impaired mastication Oral Phase Functional Implications: Impaired mastication;Oral residue     Raylon Lamson I. Vear Clock, MS, CCC-SLP Acute Rehabilitation Services Office number [phone]  Scheryl Marten 03/26/2023,11:00 AM

## 2023-03-26 NOTE — Progress Notes (Signed)
Inpatient Rehab Admissions Coordinator Note:   Per therapy recommendations patient was screened for CIR candidacy by Stephania Fragmin, PT. At this time, pt appears to be a potential candidate for CIR. I will place an order for rehab consult for full assessment, per our protocol.  Please contact me any with questions.Estill Dooms, PT, DPT [phone] 03/26/23 3:49 PM

## 2023-03-27 ENCOUNTER — Inpatient Hospital Stay (HOSPITAL_COMMUNITY): Payer: Medicare (Managed Care)

## 2023-03-27 DIAGNOSIS — G8194 Hemiplegia, unspecified affecting left nondominant side: Secondary | ICD-10-CM

## 2023-03-27 DIAGNOSIS — I161 Hypertensive emergency: Secondary | ICD-10-CM | POA: Diagnosis not present

## 2023-03-27 DIAGNOSIS — G20B1 Parkinson's disease with dyskinesia, without mention of fluctuations: Secondary | ICD-10-CM | POA: Diagnosis not present

## 2023-03-27 DIAGNOSIS — N179 Acute kidney failure, unspecified: Secondary | ICD-10-CM | POA: Diagnosis not present

## 2023-03-27 DIAGNOSIS — I610 Nontraumatic intracerebral hemorrhage in hemisphere, subcortical: Secondary | ICD-10-CM | POA: Diagnosis not present

## 2023-03-27 DIAGNOSIS — I612 Nontraumatic intracerebral hemorrhage in hemisphere, unspecified: Secondary | ICD-10-CM | POA: Diagnosis not present

## 2023-03-27 LAB — GLUCOSE, CAPILLARY
Glucose-Capillary: 103 mg/dL — ABNORMAL HIGH (ref 70–99)
Glucose-Capillary: 198 mg/dL — ABNORMAL HIGH (ref 70–99)
Glucose-Capillary: 140 mg/dL — ABNORMAL HIGH (ref 70–99)
Glucose-Capillary: 245 mg/dL — ABNORMAL HIGH (ref 70–99)
Glucose-Capillary: 111 mg/dL — ABNORMAL HIGH (ref 70–99)

## 2023-03-27 LAB — COMPREHENSIVE METABOLIC PANEL
ALT: 23 U/L (ref 0–44)
AST: 244 U/L — ABNORMAL HIGH (ref 15–41)
Albumin: 2.1 g/dL — ABNORMAL LOW (ref 3.5–5.0)
Alkaline Phosphatase: 127 U/L — ABNORMAL HIGH (ref 38–126)
Anion gap: 9 (ref 5–15)
BUN: 35 mg/dL — ABNORMAL HIGH (ref 8–23)
CO2: 20 mmol/L — ABNORMAL LOW (ref 22–32)
Calcium: 8.6 mg/dL — ABNORMAL LOW (ref 8.9–10.3)
Chloride: 106 mmol/L (ref 98–111)
Creatinine, Ser: 2.68 mg/dL — ABNORMAL HIGH (ref 0.44–1.00)
GFR, Estimated: 18 mL/min — ABNORMAL LOW (ref >60)
Glucose, Bld: 102 mg/dL — ABNORMAL HIGH (ref 70–99)
Potassium: 3.6 mmol/L (ref 3.5–5.1)
Sodium: 135 mmol/L (ref 135–145)
Total Bilirubin: 0.5 mg/dL (ref 0.3–1.2)
Total Protein: 5.9 g/dL — ABNORMAL LOW (ref 6.5–8.1)

## 2023-03-27 LAB — CBC
HCT: 30.1 % — ABNORMAL LOW (ref 36.0–46.0)
Hemoglobin: 10.4 g/dL — ABNORMAL LOW (ref 12.0–15.0)
MCH: 30.6 pg (ref 26.0–34.0)
MCHC: 34.6 g/dL (ref 30.0–36.0)
MCV: 88.5 fL (ref 80.0–100.0)
Platelets: 268 10*3/uL (ref 150–400)
RBC: 3.4 MIL/uL — ABNORMAL LOW (ref 3.87–5.11)
RDW: 12.9 % (ref 11.5–15.5)
WBC: 13.8 10*3/uL — ABNORMAL HIGH (ref 4.0–10.5)
nRBC: 0 % (ref 0.0–0.2)

## 2023-03-27 LAB — MAGNESIUM: Magnesium: 2.7 mg/dL — ABNORMAL HIGH (ref 1.7–2.4)

## 2023-03-27 MED ORDER — AMLODIPINE BESYLATE 10 MG PO TABS
10.0000 mg | ORAL_TABLET | Freq: Every day | ORAL | Status: DC
  Administered 2023-03-28 – 2023-04-01 (×5): 10 mg via ORAL
  Filled 2023-03-27 (×6): qty 1

## 2023-03-27 MED ORDER — FLUOXETINE HCL 20 MG PO CAPS
40.0000 mg | ORAL_CAPSULE | Freq: Every day | ORAL | Status: DC
  Administered 2023-03-27 – 2023-03-31 (×5): 40 mg via ORAL
  Filled 2023-03-27 (×6): qty 2

## 2023-03-27 MED ORDER — BISACODYL 10 MG RE SUPP: 10.0000 mg | Freq: Every day | RECTAL | Status: DC | PRN

## 2023-03-27 MED ORDER — INSULIN ASPART 100 UNIT/ML IJ SOLN
0.0000 [IU] | Freq: Three times a day (TID) | INTRAMUSCULAR | Status: DC
  Administered 2023-03-28 – 2023-03-29 (×2): 1 [IU] via SUBCUTANEOUS
  Administered 2023-03-29: 2 [IU] via SUBCUTANEOUS
  Administered 2023-03-30: 1 [IU] via SUBCUTANEOUS
  Administered 2023-03-30 – 2023-03-31 (×3): 2 [IU] via SUBCUTANEOUS
  Administered 2023-04-01: 3 [IU] via SUBCUTANEOUS

## 2023-03-27 MED ORDER — POTASSIUM CHLORIDE 20 MEQ PO PACK
20.0000 meq | PACK | Freq: Once | ORAL | Status: AC
  Administered 2023-03-27: 20 meq via ORAL
  Filled 2023-03-27: qty 1

## 2023-03-27 MED ORDER — BISACODYL 10 MG RE SUPP
10.0000 mg | Freq: Once | RECTAL | Status: AC
  Administered 2023-03-27: 10 mg via RECTAL
  Filled 2023-03-27: qty 1

## 2023-03-27 MED ORDER — AMLODIPINE BESYLATE 5 MG PO TABS
5.0000 mg | ORAL_TABLET | Freq: Once | ORAL | Status: AC
  Administered 2023-03-27: 5 mg via ORAL
  Filled 2023-03-27: qty 1

## 2023-03-27 MED ORDER — INSULIN ASPART 100 UNIT/ML IJ SOLN
0.0000 [IU] | Freq: Every day | INTRAMUSCULAR | Status: DC
  Administered 2023-03-29: 2 [IU] via SUBCUTANEOUS

## 2023-03-27 NOTE — Progress Notes (Signed)
Inpatient Rehab Coordinator Note:  I met with patient and her daughter, Lawson Fiscal, at bedside to discuss CIR recommendations and goals/expectations of CIR stay.  We reviewed 3 hrs/day of therapy, physician follow up, and average length of stay 2 weeks (dependent upon progress) with goals of moderate assist (50% of any given task).  Lawson Fiscal confirms she is home 24/7 and able to provide expected level of care at discharge.  We reviewed need for prior auth with [employer].  Note pt still requiring cleviprex will follow for resolution   Estill Dooms, PT, DPT Admissions Coordinator [phone] 03/27/23  1:33 PM

## 2023-03-27 NOTE — Progress Notes (Signed)
[name] Progress Note    Assessment/ Plan:   Acute right thalamic ICH - per neuro, SBP goal <160, receiving cleviprex  HTN - see #1 AKI/CKD Stage IIIb - with obstruction of left ureter due to hemorrhage/clot.  She has been evaluated by urology. Cr improving.  Continue supportive care No uremic symptoms at this time Urology has deferred intervention at this time and hopeful that hydro will resolve/improve with resolution of the clot Peak Cr 3.45 on 4/14. Cr down to 2.68 today Left hydronephrosis - s/p urology evaluation.  They feel that her hydronephrosis is related to a formed clot in the renal pelvis and that this should improve in the next 48-72 hours when the clot dissolves  Gross hematuria - following fall with left hydronephrosis and evidence of hemorrhage/clot.  Eliquis on hold.  S/p urology eval as above. Suspecting this is secondary to ruptured cyst secondary to fall PCKD - possible hemorrhagic cyst. Frequent falls - due to Parkinson's disease.  Agree with stopping Eliquis. ABLA - stable  DM type 2 - per primary team    Pyelonephritis - on rocephin. Per primary team.  Will need antibiotics for appropriate duration for pyelo (1-2 weeks or per primary team)  Nothing else to add from a nephrology perspective at this time. Will sign off. Please call with any questions/concerns.  Subjective:   No acute events. Stable findings on CTH this am. Urine continues to clear up. Heparin subq to start tomorrow Uop ~1.5L with one unmeasured void   Objective:   BP (!) 148/80   Pulse 72   Temp 98.2 F (36.8 C) (Oral)   Resp 19   Ht  (1.6 m)   Wt 76.7 kg   SpO2 95%   BMI 29.95 kg/m   Intake/Output Summary (Last 24 hours) at 03/27/2023 1231 Last data filed at 03/27/2023 1610 Gross per 24 hour  Intake 292.71 ml  Output 1550 ml  Net -1257.29 ml   Weight change:   Physical Exam: Gen: NAD CVS: RRR Resp: CTA B/L Abd: soft, nt/nd Ext: left hand edema Neuro:  left sided weakness, awake/alert  Imaging: CT HEAD WO CONTRAST ( )  Result Date: 03/27/2023 CLINICAL DATA:  Stroke, hemorrhagic. EXAM: CT HEAD WITHOUT CONTRAST TECHNIQUE: Contiguous axial images were obtained from the base of the skull through the vertex without intravenous contrast. RADIATION DOSE REDUCTION: This exam was performed according to the departmental dose-optimization program which includes automated exposure control, adjustment of the mA and/or kV according to patient size and/or use of iterative reconstruction technique. COMPARISON:  Head CT 03/26/2023. FINDINGS: Brain: Unchanged right thalamic hemorrhage with surrounding vasogenic edema. No new intracranial hemorrhage. Unchanged severe chronic small-vessel disease with multifocal lacunar infarcts. No new loss of cortical gray-white differentiation. No hydrocephalus or extra-axial collection. No mass effect or midline shift. Vascular: No hyperdense vessel or unexpected calcification. Skull: No calvarial fracture or suspicious bone lesion. Skull base is unremarkable. Sinuses/Orbits: Moderate paranasal sinus disease. Orbits are unremarkable. Other: None. IMPRESSION: 1. Unchanged right thalamic hemorrhage with surrounding vasogenic edema. No new intracranial hemorrhage. 2. Unchanged severe chronic small-vessel disease. Electronically Signed   By: Orvan Falconer M.D.   On: 03/27/2023 10:31   VAS US CAROTID  Result Date: 03/27/2023 Carotid Arterial Duplex Study Patient Name:  Melissa Lopez  Date of Exam:   03/26/2023 Medical Rec #: [id number]         Accession #:    [id number] Date of Birth: 1951-07-08  Patient Gender: F Patient Age:   72 years Exam Location:  [facility] Procedure:      VAS US CAROTID Referring Phys: Scheryl Marten XU --------------------------------------------------------------------------------  Indications:       CVA and Left sided weakness. Risk Factors:      Hypertension, hyperlipidemia, Diabetes, coronary artery                     disease, prior CVA. Comparison Study:  04/17/20 Performing Technologist: Marilynne Halsted RDMS, RVT  Examination Guidelines: A complete evaluation includes B-mode imaging, spectral Doppler, color Doppler, and power Doppler as needed of all accessible portions of each vessel. Bilateral testing is considered an integral part of a complete examination. Limited examinations for reoccurring indications may be performed as noted.  Right Carotid Findings: +----------+--------+--------+--------+---------------------+------------------+           PSV cm/sEDV cm/sStenosisPlaque Description   Comments           +----------+--------+--------+--------+---------------------+------------------+ CCA Prox  130     18                                                      +----------+--------+--------+--------+---------------------+------------------+ CCA Mid   77      20                                                      +----------+--------+--------+--------+---------------------+------------------+ CCA Distal64      18                                   intimal thickening +----------+--------+--------+--------+---------------------+------------------+ ICA Prox  76      30      1-39%   irregular and        intimal thickening                                   calcific                                +----------+--------+--------+--------+---------------------+------------------+ ICA Mid   106     32                                   tortuous           +----------+--------+--------+--------+---------------------+------------------+ ICA Distal63      25                                                      +----------+--------+--------+--------+---------------------+------------------+ ECA       104     20                                                      +----------+--------+--------+--------+---------------------+------------------+  +----------+--------+-------+----------------+-------------------+  PSV cm/sEDV cmsDescribe        Arm Pressure (mmHG) +----------+--------+-------+----------------+-------------------+ Subclavian101            Multiphasic, WNL                    +----------+--------+-------+----------------+-------------------+ +---------+--------+--+--------+--+---------+ VertebralPSV cm/s39EDV cm/s17Antegrade +---------+--------+--+--------+--+---------+  Left Carotid Findings: +----------+--------+--------+--------+------------------+------------------+           PSV cm/sEDV cm/sStenosisPlaque DescriptionComments           +----------+--------+--------+--------+------------------+------------------+ CCA Prox  76      20                                                   +----------+--------+--------+--------+------------------+------------------+ CCA Distal51      20                                                   +----------+--------+--------+--------+------------------+------------------+ ICA Prox  68      22      1-39%                     intimal thickening +----------+--------+--------+--------+------------------+------------------+ ICA Mid   67      24                                                   +----------+--------+--------+--------+------------------+------------------+ ICA Distal57      24                                                   +----------+--------+--------+--------+------------------+------------------+ ECA       87      16                                                   +----------+--------+--------+--------+------------------+------------------+ +----------+--------+--------+----------------+-------------------+           PSV cm/sEDV cm/sDescribe        Arm Pressure (mmHG) +----------+--------+--------+----------------+-------------------+ Subclavian182             Multiphasic, WNL                     +----------+--------+--------+----------------+-------------------+ +---------+--------+--+--------+--+---------+ VertebralPSV cm/s32EDV cm/s12Antegrade +---------+--------+--+--------+--+---------+   Summary: Right Carotid: Velocities in the right ICA are consistent with a 1-39% stenosis. Left Carotid: Velocities in the left ICA are consistent with a 1-39% stenosis. Vertebrals:  Bilateral vertebral arteries demonstrate antegrade flow. Subclavians: Normal flow hemodynamics were seen in bilateral subclavian              arteries. *See table(s) above for measurements and observations.  Electronically signed by Delia Heady MD on 03/27/2023 at 7:12:36 AM.    Final    CT HEAD WO CONTRAST ( )  Result Date: 03/26/2023 CLINICAL DATA:  72 year old female status post code stroke presentation yesterday.  Left side weakness. Small acute right thalamic hemorrhage. EXAM: CT HEAD WITHOUT CONTRAST TECHNIQUE: Contiguous axial images were obtained from the base of the skull through the vertex without intravenous contrast. RADIATION DOSE REDUCTION: This exam was performed according to the departmental dose-optimization program which includes automated exposure control, adjustment of the mA and/or kV according to patient size and/or use of iterative reconstruction technique. COMPARISON:  Head CT 03/25/2023. FINDINGS: Brain: Small hyperdense, bilobed intra-axial hemorrhage of the right thalamus is stable from yesterday (sagittal images 23 and 24), still suspected at less than 1 mL volume. Possible regional edema but no significant mass effect. Underlying advanced chronic small vessel disease with confluent hypodensity in the bilateral cerebral white matter and scattered hypodensity in the bilateral deep gray nuclei (left basal ganglia is relatively spared). No IVH or ventriculomegaly.  No new intracranial abnormality. Vascular: Calcified atherosclerosis at the skull base. No suspicious intracranial vascular hyperdensity.  Skull: No acute osseous abnormality identified. Sinuses/Orbits: Stable bilateral paranasal sinus mucosal thickening and opacification. Bilateral mastoid opacification is stable. Other: No acute orbit or scalp soft tissue finding. IMPRESSION: 1. Stable small bilobed right thalamic hemorrhage. Possible surrounding edema but no significant mass effect. No intraventricular extension or other complicating features. 2. Underlying advanced small vessel disease. 3. No new intracranial abnormality. Electronically Signed   By: Odessa Fleming M.D.   On: 03/26/2023 06:21    Labs: BMET Recent Labs  Lab 03/22/23 1911 03/23/23 0420 03/24/23 0619 03/25/23 0446 03/26/23 0228 03/27/23 0435  NA 139 137 135 132* 135 135  K 3.8 3.1* 3.9 3.5 3.2* 3.6  CL 100 103 107 104 100 106  CO2 25 22 21* 19* 20* 20*  GLUCOSE 168* 102* 107* 132* 144* 102*  BUN 31* 34* 36* 37* 36* 35*  CREATININE 3.04* 3.45* 3.91* 3.55* 2.95* 2.68*  CALCIUM 9.4 8.9 8.1* 8.4* 8.4* 8.6*   CBC Recent Labs  Lab 03/24/23 0619 03/25/23 0446 03/26/23 0228 03/27/23 0435  WBC 22.0* 22.8* 18.2* 13.8*  HGB 10.5* 11.1* 10.8* 10.4*  HCT 32.0* 34.0* 31.5* 30.1*  MCV 93.0 92.6 89.5 88.5  PLT 189 205 214 268    Medications:     [START ON 03/28/2023] amLODipine  10 mg Oral Daily   amLODipine  5 mg Oral Once   bisacodyl  10 mg Rectal Once   carbidopa-levodopa  1 tablet Oral TID AC   carvedilol  12.5 mg Oral BID WC   Chlorhexidine Gluconate Cloth  6 each Topical Daily   cinacalcet  45 mg Oral q AM   cyclobenzaprine  5 mg Oral QHS   FLUoxetine  40 mg Oral QHS   gabapentin  200 mg Oral QHS   hydrALAZINE  50 mg Oral TID   insulin aspart  0-9 Units Subcutaneous Q4H   metoCLOPramide  5 mg Oral TID AC   mirtazapine  7.5 mg Oral QHS   polyethylene glycol  17 g Oral Daily   rosuvastatin  20 mg Oral Daily   senna-docusate  1 tablet Oral QHS   sodium bicarbonate  650 mg Oral TID      Anthony Sar, MD [facility] 03/27/2023, 12:31  PM

## 2023-03-27 NOTE — Progress Notes (Signed)
eLink Physician-Brief Progress Note Patient Name: Melissa Lopez DOB: Jan 12, 1951 MRN: [id number]   Date of Service  03/27/2023  HPI/Events of Note  Hypokalemia  eICU Interventions  Repleted orally     Intervention Category Minor Interventions: Electrolytes abnormality - evaluation and management  Carilyn Goodpasture 03/27/2023, 5:44 AM

## 2023-03-27 NOTE — Progress Notes (Signed)
eLink Physician-Brief Progress Note Patient Name: Melissa Lopez DOB: 1951/11/15 MRN: [id number]   Date of Service  03/27/2023  HPI/Events of Note  22 beat run of SVT  eICU Interventions  Continue to monitor        Carilyn Goodpasture 03/27/2023, 6:55 AM

## 2023-03-27 NOTE — Progress Notes (Signed)
Physical Therapy Treatment Patient Details Name: Melissa Lopez MRN: [id number] DOB: 1951/03/01 Today's Date: 03/27/2023   History of Present Illness Melissa Lopez is a 72 y.o. female who presents to ED at [facility] secondary to hematuriawith recent history of multiple falls, secondary to her Parkinson disease, she does report left flank pain, hematuria.  Increased left weakness reported with therapy on 4/16 with positive CT scan noted for right acute thalamic hemorrage.  She was then transferred to [facility] for closer monitoring.  PMH of CKD stage III, coronary artery disease, sick sinus syndrome status post permanent pacemaker, primary hyperparathyroidism, diabetes mellitus with gastroparesis, hypertension, and history of Parkinson disease.    PT Comments    Patient with improved upright posture during OOB transfer this session and able to assist more with bed mobility.  RN reported change with pt's ability to move extremities this morning, but seems actually a little improved over last PT session.  Continue to recommend intensive inpatient rehab prior to d/c home.  Will continue to follow in acute setting.    Recommendations for follow up therapy are one component of a multi-disciplinary discharge planning process, led by the attending physician.  Recommendations may be updated based on patient status, additional functional criteria and insurance authorization.  Follow Up Recommendations       Assistance Recommended at Discharge Frequent or constant Supervision/Assistance  Patient can return home with the following A lot of help with bathing/dressing/bathroom;A lot of help with walking and/or transfers;Help with stairs or ramp for entrance;Assistance with cooking/housework   Equipment Recommendations  None recommended by PT    Recommendations for Other Services       Precautions / Restrictions Precautions Precautions: Fall Precaution Comments: Hx of Parkinson's; painful L hand      Mobility  Bed Mobility Overal bed mobility: Needs Assistance Bed Mobility: Rolling, Sidelying to Sit Rolling: Mod assist   Supine to sit: +2 for safety/equipment     General bed mobility comments: assist for trunk upright, pt able to move legs off bed; rolling initially for hygiene as soiled with urine from purewick    Transfers Overall transfer level: Needs assistance Equipment used: Rolling walker (2 wheels) Transfers: Sit to/from Stand, Bed to chair/wheelchair/BSC Sit to Stand: Mod assist, +2 physical assistance     Squat pivot transfers: Mod assist, +2 physical assistance     General transfer comment: lifting assist to stand, onceu pright able to take steps to chair with increased time max cues    Ambulation/Gait               General Gait Details: NT, limited by fatigue   Stairs             Wheelchair Mobility    Modified Rankin (Stroke Patients Only) Modified Rankin (Stroke Patients Only) Pre-Morbid Rankin Score: Moderately severe disability Modified Rankin: Severe disability     Balance Overall balance assessment: Needs assistance Sitting-balance support: Feet unsupported Sitting balance-Leahy Scale: Poor Sitting balance - Comments: on EOB with S no UE support, then when attempting to lift leg LOB posterior max A to recover     Standing balance-Leahy Scale: Poor Standing balance comment: mod A with RW for balance                            Cognition Arousal/Alertness: Awake/alert Behavior During Therapy: Flat affect Overall Cognitive Status: History of cognitive impairments - at baseline  General Comments: Rn reports earlier not able to lift legs or move hands much and lethargic, now back to similar cognition as last session        Exercises      General Comments General comments (skin integrity, edema, etc.): VSS on RA; continued edema L hand and some errythema compared to  last session      Pertinent Vitals/Pain Pain Assessment Pain Assessment: Faces Faces Pain Scale: Hurts a little bit Pain Descriptors / Indicators: Discomfort Pain Intervention(s): Monitored during session, Repositioned    Home Living                          Prior Function            PT Goals (current goals can now be found in the care plan section) Progress towards PT goals: Progressing toward goals    Frequency    Min 4X/week      PT Plan Current plan remains appropriate    Co-evaluation              AM-PAC PT "6 Clicks" Mobility   Outcome Measure  Help needed turning from your back to your side while in a flat bed without using bedrails?: A Lot Help needed moving from lying on your back to sitting on the side of a flat bed without using bedrails?: A Lot Help needed moving to and from a bed to a chair (including a wheelchair)?: Total Help needed standing up from a chair using your arms (e.g., wheelchair or bedside chair)?: Total Help needed to walk in hospital room?: Total Help needed climbing 3-5 steps with a railing? : Total 6 Click Score: 8    End of Session Equipment Utilized During Treatment: Gait belt Activity Tolerance: Patient limited by fatigue Patient left: in chair;with call bell/phone within reach;with chair alarm set Nurse Communication: Mobility status PT Visit Diagnosis: Other abnormalities of gait and mobility (R26.89);Muscle weakness (generalized) (M62.81);History of falling (Z91.81);Pain Pain - Right/Left: Left Pain - part of body: Hand     Time: 1137-1205 PT Time Calculation (min) (ACUTE ONLY): 28 min  Charges:  $Therapeutic Activity: 23-37 mins                     Sheran Lawless, PT Acute Rehabilitation Services [phone] 03/27/2023    Melissa Lopez 03/27/2023, 1:14 PM

## 2023-03-27 NOTE — Progress Notes (Signed)
SLP Cancellation Note  Patient Details Name: Melissa Lopez MRN: [id number] DOB: 07-04-1951   Cancelled treatment:       Reason Eval/Treat Not Completed: Patient at procedure or test/unavailable (Pt off unit for MRI. SLP will follow up later as schedule allows.)  Victoria Euceda I. Vear Clock, MS, CCC-SLP Acute Rehabilitation Services Office number [phone]  Scheryl Marten 03/27/2023, 9:27 AM

## 2023-03-27 NOTE — Progress Notes (Signed)
STROKE TEAM PROGRESS NOTE   SUBJECTIVE (INTERVAL HISTORY) Her daughter and Dr. Thedore Mins at the bedside. Pt this morning was found to be lethargic and increased left arm weakness. MRI not able to be done due to pacer. Stat CT showed stable hematoma. Came back from CT, pt mental status improved and back to previous baseline and left arm weakness same as yesterday.   OBJECTIVE Temp:  [97.9 F (36.6 C)-99.1 F (37.3 C)] 99.1 F (37.3 C) (04/18 2000) Pulse Rate:  [63-81] 70 (04/18 2000) Cardiac Rhythm: Normal sinus rhythm (04/18 2000) Resp:  [6-25] 19 (04/18 2000) BP: (112-187)/(64-112) 112/72 (04/18 2000) SpO2:  [92 %-96 %] 95 % (04/18 2000)  Recent Labs  Lab 03/27/23 0354 03/27/23 0725 03/27/23 1134 03/27/23 1523 03/27/23 2142  GLUCAP 103* 198* 140* 245* 111*   Recent Labs  Lab 03/23/23 0420 03/24/23 0619 03/25/23 0446 03/26/23 0228 03/27/23 0435  NA 137 135 132* 135 135  K 3.1* 3.9 3.5 3.2* 3.6  CL 103 107 104 100 106  CO2 22 21* 19* 20* 20*  GLUCOSE 102* 107* 132* 144* 102*  BUN 34* 36* 37* 36* 35*  CREATININE 3.45* 3.91* 3.55* 2.95* 2.68*  CALCIUM 8.9 8.1* 8.4* 8.4* 8.6*  MG  --   --   --  1.5* 2.7*   Recent Labs  Lab 03/27/23 0435  AST 244*  ALT 23  ALKPHOS 127*  BILITOT 0.5  PROT 5.9*  ALBUMIN 2.1*   Recent Labs  Lab 03/23/23 0420 03/24/23 0619 03/25/23 0446 03/26/23 0228 03/27/23 0435  WBC 19.7* 22.0* 22.8* 18.2* 13.8*  HGB 11.9* 10.5* 11.1* 10.8* 10.4*  HCT 36.2 32.0* 34.0* 31.5* 30.1*  MCV 92.3 93.0 92.6 89.5 88.5  PLT 245 189 205 214 268   Recent Labs  Lab 03/22/23 1911  CKTOTAL 33*   No results for input(s): "LABPROT", "INR" in the last 72 hours. No results for input(s): "COLORURINE", "LABSPEC", "PHURINE", "GLUCOSEU", "HGBUR", "BILIRUBINUR", "KETONESUR", "PROTEINUR", "UROBILINOGEN", "NITRITE", "LEUKOCYTESUR" in the last 72 hours.  Invalid input(s): "APPERANCEUR"     Component Value Date/Time   CHOL 66 03/26/2023 0228   TRIG 114  03/26/2023 0228   HDL 29 (L) 03/26/2023 0228   CHOLHDL 2.3 03/26/2023 0228   VLDL 23 03/26/2023 0228   LDLCALC 14 03/26/2023 0228   Lab Results  Component Value Date   HGBA1C 5.2 03/22/2023   No results found for: "LABOPIA", "COCAINSCRNUR", "LABBENZ", "AMPHETMU", "THCU", "LABBARB"  No results for input(s): "ETH" in the last 168 hours.  I have personally reviewed the radiological images below and agree with the radiology interpretations.  CT HEAD WO CONTRAST ( )  Result Date: 03/27/2023 CLINICAL DATA:  Stroke, hemorrhagic. EXAM: CT HEAD WITHOUT CONTRAST TECHNIQUE: Contiguous axial images were obtained from the base of the skull through the vertex without intravenous contrast. RADIATION DOSE REDUCTION: This exam was performed according to the departmental dose-optimization program which includes automated exposure control, adjustment of the mA and/or kV according to patient size and/or use of iterative reconstruction technique. COMPARISON:  Head CT 03/26/2023. FINDINGS: Brain: Unchanged right thalamic hemorrhage with surrounding vasogenic edema. No new intracranial hemorrhage. Unchanged severe chronic small-vessel disease with multifocal lacunar infarcts. No new loss of cortical gray-white differentiation. No hydrocephalus or extra-axial collection. No mass effect or midline shift. Vascular: No hyperdense vessel or unexpected calcification. Skull: No calvarial fracture or suspicious bone lesion. Skull base is unremarkable. Sinuses/Orbits: Moderate paranasal sinus disease. Orbits are unremarkable. Other: None. IMPRESSION: 1. Unchanged right thalamic hemorrhage with surrounding vasogenic  edema. No new intracranial hemorrhage. 2. Unchanged severe chronic small-vessel disease. Electronically Signed   By: Orvan Falconer M.D.   On: 03/27/2023 10:31   VAS US CAROTID  Result Date: 03/27/2023 Carotid Arterial Duplex Study Patient Name:  ABBI MANCINI  Date of Exam:   03/26/2023 Medical Rec #:  [id number]         Accession #:    [id number] Date of Birth: 22-Oct-1951         Patient Gender: F Patient Age:   72 years Exam Location:  [facility] Procedure:      VAS US CAROTID Referring Phys: Scheryl Marten Baya Lentz --------------------------------------------------------------------------------  Indications:       CVA and Left sided weakness. Risk Factors:      Hypertension, hyperlipidemia, Diabetes, coronary artery                    disease, prior CVA. Comparison Study:  04/17/20 Performing Technologist: Marilynne Halsted RDMS, RVT  Examination Guidelines: A complete evaluation includes B-mode imaging, spectral Doppler, color Doppler, and power Doppler as needed of all accessible portions of each vessel. Bilateral testing is considered an integral part of a complete examination. Limited examinations for reoccurring indications may be performed as noted.  Right Carotid Findings: +----------+--------+--------+--------+---------------------+------------------+           PSV cm/sEDV cm/sStenosisPlaque Description   Comments           +----------+--------+--------+--------+---------------------+------------------+ CCA Prox  130     18                                                      +----------+--------+--------+--------+---------------------+------------------+ CCA Mid   77      20                                                      +----------+--------+--------+--------+---------------------+------------------+ CCA Distal64      18                                   intimal thickening +----------+--------+--------+--------+---------------------+------------------+ ICA Prox  76      30      1-39%   irregular and        intimal thickening                                   calcific                                +----------+--------+--------+--------+---------------------+------------------+ ICA Mid   106     32                                   tortuous            +----------+--------+--------+--------+---------------------+------------------+ ICA Distal63      25                                                      +----------+--------+--------+--------+---------------------+------------------+  ECA       104     20                                                      +----------+--------+--------+--------+---------------------+------------------+ +----------+--------+-------+----------------+-------------------+           PSV cm/sEDV cmsDescribe        Arm Pressure (mmHG) +----------+--------+-------+----------------+-------------------+ Subclavian101            Multiphasic, WNL                    +----------+--------+-------+----------------+-------------------+ +---------+--------+--+--------+--+---------+ VertebralPSV cm/s39EDV cm/s17Antegrade +---------+--------+--+--------+--+---------+  Left Carotid Findings: +----------+--------+--------+--------+------------------+------------------+           PSV cm/sEDV cm/sStenosisPlaque DescriptionComments           +----------+--------+--------+--------+------------------+------------------+ CCA Prox  76      20                                                   +----------+--------+--------+--------+------------------+------------------+ CCA Distal51      20                                                   +----------+--------+--------+--------+------------------+------------------+ ICA Prox  68      22      1-39%                     intimal thickening +----------+--------+--------+--------+------------------+------------------+ ICA Mid   67      24                                                   +----------+--------+--------+--------+------------------+------------------+ ICA Distal57      24                                                   +----------+--------+--------+--------+------------------+------------------+ ECA       87      16                                                    +----------+--------+--------+--------+------------------+------------------+ +----------+--------+--------+----------------+-------------------+           PSV cm/sEDV cm/sDescribe        Arm Pressure (mmHG) +----------+--------+--------+----------------+-------------------+ Subclavian182             Multiphasic, WNL                    +----------+--------+--------+----------------+-------------------+ +---------+--------+--+--------+--+---------+ VertebralPSV cm/s32EDV cm/s12Antegrade +---------+--------+--+--------+--+---------+   Summary: Right Carotid: Velocities in the right ICA are consistent with a 1-39% stenosis. Left Carotid: Velocities in the left ICA are consistent with a 1-39%  stenosis. Vertebrals:  Bilateral vertebral arteries demonstrate antegrade flow. Subclavians: Normal flow hemodynamics were seen in bilateral subclavian              arteries. *See table(s) above for measurements and observations.  Electronically signed by Delia Heady MD on 03/27/2023 at 7:12:36 AM.    Final    CT HEAD WO CONTRAST ( )  Result Date: 03/26/2023 CLINICAL DATA:  72 year old female status post code stroke presentation yesterday. Left side weakness. Small acute right thalamic hemorrhage. EXAM: CT HEAD WITHOUT CONTRAST TECHNIQUE: Contiguous axial images were obtained from the base of the skull through the vertex without intravenous contrast. RADIATION DOSE REDUCTION: This exam was performed according to the departmental dose-optimization program which includes automated exposure control, adjustment of the mA and/or kV according to patient size and/or use of iterative reconstruction technique. COMPARISON:  Head CT 03/25/2023. FINDINGS: Brain: Small hyperdense, bilobed intra-axial hemorrhage of the right thalamus is stable from yesterday (sagittal images 23 and 24), still suspected at less than 1 mL volume. Possible regional edema but no significant mass  effect. Underlying advanced chronic small vessel disease with confluent hypodensity in the bilateral cerebral white matter and scattered hypodensity in the bilateral deep gray nuclei (left basal ganglia is relatively spared). No IVH or ventriculomegaly.  No new intracranial abnormality. Vascular: Calcified atherosclerosis at the skull base. No suspicious intracranial vascular hyperdensity. Skull: No acute osseous abnormality identified. Sinuses/Orbits: Stable bilateral paranasal sinus mucosal thickening and opacification. Bilateral mastoid opacification is stable. Other: No acute orbit or scalp soft tissue finding. IMPRESSION: 1. Stable small bilobed right thalamic hemorrhage. Possible surrounding edema but no significant mass effect. No intraventricular extension or other complicating features. 2. Underlying advanced small vessel disease. 3. No new intracranial abnormality. Electronically Signed   By: Odessa Fleming M.D.   On: 03/26/2023 06:21   CT HEAD CODE STROKE WO CONTRAST  Result Date: 03/25/2023 CLINICAL DATA:  Code stroke. 72 year old female with left side weakness beginning yesterday. EXAM: CT HEAD WITHOUT CONTRAST TECHNIQUE: Contiguous axial images were obtained from the base of the skull through the vertex without intravenous contrast. RADIATION DOSE REDUCTION: This exam was performed according to the departmental dose-optimization program which includes automated exposure control, adjustment of the mA and/or kV according to patient size and/or use of iterative reconstruction technique. COMPARISON:  Head CT 06/13/2021. FINDINGS: Brain: Small volume hyperdense hemorrhage in the right thalamus is new, superimposed on chronic bilateral basal ganglia heterogeneity and confluent bilateral cerebral white matter hypodensity. Oval, bilobed right thalamic intra-axial blood products encompassing 12 by 7 by 6 mm (AP by transverse by CC), volume estimated at less than 1 mL. Surrounding hypodensity appears increased  since 2022 and is probably in part edema. Other cerebral gray-white matter differentiation does not appear significantly changed from the prior. No intraventricular hemorrhage, ventriculomegaly, intracranial mass effect, acute cortically based infarct. Vascular: Calcified atherosclerosis at the skull base. No suspicious intracranial vascular hyperdensity. Skull: No acute osseous abnormality identified. Chronic hyperostosis. Sinuses/Orbits: Increased inflammatory appearing paranasal sinus opacification and mucosal thickening. No sinus hemorrhage identified. New bilateral mastoid air cell opacification also. Other: No acute orbit or scalp soft tissue finding. ASPECTS [facility] Stroke Program Early CT Score) Total score (0-10 with 10 being normal): Not applicable, small acute thalamic hemorrhage. IMPRESSION: 1. Small acute right thalamic hemorrhage, volume less than 1 mL. Evidence of mild surrounding edema but no significant mass effect or other complicating features. 2. Underlying advanced chronic small vessel disease, otherwise not significantly changed from 2022. Study discussed  by telephone with Dr. Ritta Slot on 03/25/2023 at 10:57 . Electronically Signed   By: Odessa Fleming M.D.   On: 03/25/2023 11:02   DG Wrist Complete Left  Result Date: 03/25/2023 CLINICAL DATA:  86310 Swelling 16109 EXAM: LEFT WRIST - COMPLETE 3+ VIEW COMPARISON:  None Available. FINDINGS: There is no evidence of acute fracture. Alignment is normal. There is mild radiocarpal and base of thumb osteoarthritis. There is radiocarpal, DRUJ, TFCC, intercarpal, CMC, and MCP joint chondrocalcinosis. No definite bony erosions. There is diffuse soft tissue swelling of the wrist. IMPRESSION: No acute osseous abnormality. Diffuse soft tissue swelling of the wrist. Extensive chondrocalcinosis suggestive of CPPD arthropathy. No evidence of bone erosion. Mild radiocarpal and base of thumb degenerative change. Electronically Signed   By: Caprice Renshaw  M.D.   On: 03/25/2023 10:22   CT Renal Stone Study  Result Date: 03/22/2023 CLINICAL DATA:  Abdominal/flank pain, stone suspected EXAM: CT ABDOMEN AND PELVIS WITHOUT CONTRAST TECHNIQUE: Multidetector CT imaging of the abdomen and pelvis was performed following the standard protocol without IV contrast. RADIATION DOSE REDUCTION: This exam was performed according to the departmental dose-optimization program which includes automated exposure control, adjustment of the mA and/or kV according to patient size and/or use of iterative reconstruction technique. COMPARISON:  CT 08/01/2021 FINDINGS: Lower chest: Trace left pleural thickening. Mild dependent atelectasis. Pacemaker wires partially included. Hepatobiliary: Pneumobilia which is intermittently been present on prior exam. Cholecystectomy. No evidence of focal liver lesion. Pancreas: Parenchymal atrophy. No ductal dilatation or inflammation. Spleen: Normal in size without focal abnormality. Adrenals/Urinary Tract: No adrenal nodule. Again seen polycystic appearance of both kidneys with cysts of varying sizes. One of the cysts in the upper pole of the left kidney is now hyperdense likely representing interval hemorrhage. There is left hydronephrosis that is new from prior exam. Diffusely increased density within the left renal collecting system which has a slightly rounded appearance in the renal pelvis measuring 2.7 cm, series 2, image 39. This is favored to represent hemorrhage and blood clot. Some hyperdense material is seen within the left ureter. No evidence of ureteral stone or obstruction. There is moderate left perinephric edema, with Peri ureteric fat stranding. No right hydronephrosis. No right renal calculi. Partially distended urinary bladder. There is no bladder stone. Stomach/Bowel: No bowel obstruction or inflammation. The appendix is not definitively seen. Vascular/Lymphatic: Aortic atherosclerosis. No aneurysm. There are small left  retroperitoneal lymph nodes, likely reactive. Reproductive: Status post hysterectomy. No adnexal masses. Other: No free air or free fluid. No abdominal wall hernia. Musculoskeletal: Degenerative change of the hips and lumbar spine. There are no acute or suspicious osseous abnormalities. IMPRESSION: 1. Left hydronephrosis with diffuse increased density within the left renal collecting system and ureter, which has a rounded appearance in the renal pelvis. This is favored to represent hemorrhage and blood clot. The possibility of infection is also considered. No evidence of ureteral stone or obstruction. Recommend follow-up after course of treatment to ensure resolution, as renal collecting system mass is not entirely excluded. 2. Polycystic appearance of both kidneys with cysts of varying sizes. One of the cysts in the upper pole of the left kidney is now hyperdense, likely representing interval hemorrhage. Aortic Atherosclerosis (ICD10-I70.0). Electronically Signed   By: Narda Rutherford M.D.   On: 03/22/2023 19:25     PHYSICAL EXAM  Temp:  [97.9 F (36.6 C)-99.1 F (37.3 C)] 99.1 F (37.3 C) (04/18 2000) Pulse Rate:  [63-81] 70 (04/18 2000) Resp:  [6-25] 19 (  04/18 2000) BP: (112-187)/(64-112) 112/72 (04/18 2000) SpO2:  [92 %-96 %] 95 % (04/18 2000)  General - Well nourished, well developed, in no apparent distress, lethargic.  Ophthalmologic - fundi not visualized due to noncooperation.  Cardiovascular - Regular rhythm and rate.  Neuro - awake, alert, eyes open, but lethargic, orientated to age, place, year and people but not to month. No aphasia, however, paucity of speech, able to answer questions use words not sentences, following all simple commands. Able to name and repeat. Mild dysarthria. Significant psychomotor slowing. No gaze palsy, tracking bilaterally, visual field full, PERRL. No significant facial droop. Tongue midline. RUE 4/5 without drift, LUE 2+/5 proximal and 2/5 distal hand  movement. Significant pain with passive movement of left arm. RLE 3/5 proximal and distal, LLE 2+/5 proximal and barely movement of toes. Sensation symmetrical bilaterally subjectively, right FTN no ataxic but not able to touch nose, gait not tested. R hand mild resting tremor.    ASSESSMENT/PLAN Ms. Eriyonna Matsushita is a 72 y.o. female with history of CKD 4, CAD 2019, hypertension, pacemaker, SSS status post pacemaker, diabetes, history of PE on Eliquis, Parkinson disease with frequent falls admitted for AKI, left hydronephrosis with hematuria, and renal pelvis hematoma. No tPA given due to ICH.    ICH:  right thalamic ICH, secondary to hypertension in setting of Eliquis use CT head right thalamic small ICH CT repeat 4/17 stable small thalamic ICH MRI not able to do due to pacer CT repeat stable  hematoma Carotid Doppler unremarkable 2D Echo EF 65 to 70% in 02/2022 Pacemaker irrigation pending LDL 14 HgbA1c 5.2 SCDs for VTE prophylaxis Eliquis (apixaban) 2.5 twice daily and aspirin 81 prior to admission, now on no antithrombotics given ICH and renal hematoma.  Ongoing aggressive stroke risk factor management Therapy recommendations: CIR Disposition: Pending  Renal pelvis hematoma due to fall Hematuria AKI on CKD with left hydronephrosis UTI CT renal study Left hydronephrosis with diffuse increased density within the left renal collecting system and ureter, which has a rounded appearance in the renal pelvis. This is favored to represent hemorrhage and blood clot.  UA WBC 6-10, RBC >50 On Rocephin Creatinine 3.04-3.45-3.91-3.55-2.68 On sodium bicarb 650 TID CCM on board  History of PE on Eliquis Per daughter PE happened in 37s On low-dose Eliquis 2.5 twice daily PTA Not sure if patient still needed anticoagulation for history of PE Consider stop Eliquis in the future if not needed given history of ICH and present disease with frequent falls  Parkinson disease with frequent  falls Resume home Sinemet Fall precaution Follow-up with [facility] Dr. Marjory Lies Continue outpatient follow-up  Diabetes HgbA1c 5.2 goal < 7.0 Controlled CBG monitoring SSI DM education and close PCP follow up  Hypertension Unstable On Cleviprex, wean off as able BP goal less than 160 Now on Coreg 12.5 twice daily, hydralazine 50 3 times daily Long term BP goal normotensive  Hyperlipidemia Home meds: Crestor 40 LDL 14, goal < 70 Now on Crestor 20 due to low LDL Continue statin at discharge  Other Stroke Risk Factors Advanced age Hx stroke/TIA, details unclear Coronary artery disease 2019  Other Active Problems SSS status post pacemaker  Hospital day # 5  This patient is critically ill due to right thalamic ICH, renal pelvis hematoma, frequent falls, AKI on CKD, history of PE and at significant risk of neurological worsening, death form hematoma expansion, cerebral edema, heart failure, renal failure. This patient's care requires constant monitoring of vital signs, hemodynamics, respiratory and cardiac  monitoring, review of multiple databases, neurological assessment, discussion with family, other specialists and medical decision making of high complexity. I spent 40 minutes of neurocritical care time in the care of this patient. I had long discussion with daughter at bedside, updated pt current condition, treatment plan and potential prognosis, and answered all the questions.  She expressed understanding and appreciation.    Marvel Plan, MD PhD Stroke Neurology 03/27/2023 10:07 PM    To contact Stroke Continuity provider, please refer to [email or web address]. After hours, contact General Neurology

## 2023-03-27 NOTE — Progress Notes (Signed)
   03/25/23 1030  Spiritual Encounters  Type of Visit Initial  Care provided to: Patient  Conversation partners present during encounter Nurse  Referral source Code page  Reason for visit Code  OnCall Visit No  Spiritual Framework  Presenting Themes Other (comment)  Community/Connection Family  Patient Stress Factors Health changes  Family Stress Factors None identified  Interventions  Spiritual Care Interventions Made Decision-making support/facilitation;Other (comment)  Intervention Outcomes  Outcomes Connection to spiritual care;Awareness of support  Spiritual Care Plan  Spiritual Care Issues Still Outstanding No further spiritual care needs at this time (see row info)   Code Stroke page and Chaplain went to room then learned that patient was already on her way to CT. Chaplain provided support to patient daughter and then met them in ICU once patient was transferred back upstairs. Provided support and facilitated communication between her and medical team. Patient may be transferred to [facility] for further evaluation and care. Chaplain will remain available in order to provide spiritual support and to assess for spiritual need.

## 2023-03-27 NOTE — Progress Notes (Signed)
Pacemaker leads are not MRI conditional per [employer] rep. Unable to scan.

## 2023-03-27 NOTE — Consult Note (Signed)
Physical Medicine and Rehabilitation Consult Reason for Consult:left sided weakness and impaired functional mobility Referring Physician: Craige Cotta   HPI: Melissa Lopez is a 72 y.o. female with hx of CKD 3b/PKD, PE, and Parkinson's Disease who presented on 03/22/23 with frequent falls and gross hematuria. She was found to be in acute renal failure. CT revealed left hydronephrosis and a blood clot in renal pelvis. Nephrology and urology were consulted. On 4/15 the patient developed left arm weakness. 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 She was started on cleviprex for elevated BP.  Neurology was consulted and felt the hemorrhage was due to hypertension in setting of chronic eliquis use. Recommended bp control and holding eliquis and asa. Pt was evaluated by PT yesterday and was max assist +2 for sit-stand/step-pivot transfers. She was unable to walk.  Pt was max assist for basic ADL's with OT. Prior admission family assisted "a little" for ADL's. She is a member of [employer].   This morning pt developed increased left sided weakness and more difficulty with speech. MRI ordered and then changed to CT due to pacer wires. There appear to be no acute findings on the CT however.   Review of Systems  Unable to perform ROS: Mental acuity   Past Medical History:  Diagnosis Date   CKD (chronic kidney disease) stage 3, GFR 30-59 ml/min 10/31/2018   - per patient report   Coronary artery disease    per patient report - 2 caths with non-obstructive CAD   Essential hypertension    Frequent falls    H/O cardiac pacemaker 10/31/2018   implanted 05/2010   Sick sinus syndrome    Type 2 diabetes mellitus with complication, with long-term current use of insulin 10/31/2018   with diabetic nephropathy   Past Surgical History:  Procedure Laterality Date   BIOPSY  09/28/2020   Procedure: BIOPSY;  Surgeon: Bernette Redbird, MD;  Location: [facility] ENDOSCOPY;  Service: Endoscopy;;   BIOPSY   08/03/2021   Procedure: BIOPSY;  Surgeon: Corbin Ade, MD;  Location: [facility];  Service: Endoscopy;;   CARDIAC PACEMAKER PLACEMENT  2008   in [place] ([facility]) for sick sinus syndrome   CHOLECYSTECTOMY     CORONARY/GRAFT ACUTE MI REVASCULARIZATION N/A 11/09/2020   Procedure: CORONARY/GRAFT ACUTE MI REVASCULARIZATION;  Surgeon: Marykay Lex, MD;  Location: [facility] INVASIVE CV LAB;  Service: Cardiovascular;  Laterality: N/A;   ESOPHAGOGASTRODUODENOSCOPY N/A 09/28/2020   Procedure: ESOPHAGOGASTRODUODENOSCOPY (EGD);  Surgeon: Bernette Redbird, MD;  Location: [facility]COPY;  Service: Endoscopy;  Laterality: N/A;   ESOPHAGOGASTRODUODENOSCOPY (EGD) WITH PROPOFOL N/A 11/10/2018   Procedure: ESOPHAGOGASTRODUODENOSCOPY (EGD) WITH PROPOFOL;  Surgeon: Charlott Rakes, MD;  Location[facility] ENDOSCOPY;  Service: Endoscopy;  Laterality: N/A;   ESOPHAGOGASTRODUODENOSCOPY (EGD) WITH PROPOFOL N/A 08/03/2021   severe reflux esophagitis, bile gastritis, felt to have delayed gastric emptying. Pathology from esophageal biopsies with fibrinopurulent exudate and granulation tissue consistent with ulcer base   LEFT HEART CATH AND CORONARY ANGIOGRAPHY N/A 10/31/2018   Procedure: LEFT HEART CATH AND CORONARY ANGIOGRAPHY;  Surgeon: Marykay Lex, MD;  Location: [facility] INVASIVE CV LAB;  Service: Cardiovascular;  Laterality: N/A;   PACEMAKER GENERATOR CHANGE  09/2018   in [place] ([facility])   REPLACEMENT TOTAL KNEE BILATERAL     Family History  Problem Relation Age of Onset   Breast cancer Mother    Bone cancer Mother    Heart attack Father 33   Breast cancer Sister  Other Sister        MVA   Heart disease Brother        stents   Diabetes Brother    Colon cancer Neg Hx    Colon polyps Neg Hx    Social History:  reports that she quit smoking about 53 years ago. Her smoking use included cigarettes. She has never used smokeless tobacco. She reports that she does not currently use alcohol. She reports that she  does not currently use drugs. Allergies:  Allergies  Allergen Reactions   Contrast Media [Iodinated Contrast Media] Swelling    Contrast Dye - the one made out of shellfish, gives a really bad reaction - swelling   Oxycodone-Acetaminophen Anaphylaxis   Xarelto  [Rivaroxaban] Swelling   Oxycodone Other (See Comments)    Statused by Person:  EAST, CAMELIA(CE) on [id number]   Medications Prior to Admission  Medication Sig Dispense Refill   acetaminophen (TYLENOL) 325 MG tablet Take 650 mg by mouth in the morning, at noon, and at bedtime.     apixaban (ELIQUIS) 2.5 MG TABS tablet Take 2.5 mg by mouth 2 (two) times daily.     aspirin 81 MG chewable tablet Chew 1 tablet (81 mg total) by mouth daily. 30 tablet 0   carbidopa-levodopa (SINEMET IR) 25-100 MG tablet Take 1 tablet by mouth 3 (three) times daily before meals. 90 tablet 6   carvedilol (COREG) 6.25 MG tablet Take 6.25 mg by mouth 2 (two) times daily with a meal.     cinacalcet (SENSIPAR) 30 MG tablet Take 1 tablet (30 mg total) by mouth 2 (two) times daily with a meal. (Patient taking differently: Take 45 mg by mouth in the morning.) 60 tablet 1   cyclobenzaprine (FLEXERIL) 5 MG tablet Take 5 mg by mouth at bedtime.     FLUoxetine (PROZAC) 20 MG tablet Take 40 mg by mouth at bedtime.     gabapentin (NEURONTIN) 400 MG capsule Take 400 mg by mouth at bedtime.     hydrALAZINE (APRESOLINE) 50 MG tablet Take 50 mg by mouth 3 (three) times daily.     HYDROcodone-acetaminophen (NORCO/VICODIN) 5-325 MG tablet Take 1 tablet by mouth every 6 (six) hours as needed for moderate pain. (Patient taking differently: Take 1 tablet by mouth 2 (two) times daily.) 15 tablet 0   lidocaine (LIDODERM) 5 % Place 2 patches onto the skin daily as needed (pain (hip & back)). (Apply hip and back )Remove & Discard patch within 12 hours or as directed by MD     Magnesium 400 MG CAPS Take 400 mg by mouth daily.     Menthol, Topical Analgesic, (BIOFREEZE EX) Apply 1  application topically 3 (three) times daily as needed (pain.).     metoCLOPramide (REGLAN) 10 MG tablet Take 10 mg by mouth in the morning and at bedtime.     mirtazapine (REMERON) 7.5 MG tablet Take 7.5 mg by mouth at bedtime.     Nutritional Supplements (,FEEDING SUPPLEMENT, PROSOURCE PLUS) liquid Take 30 mLs by mouth daily. 887 mL 0   rosuvastatin (CRESTOR) 40 MG tablet Take 40 mg by mouth in the morning.     senna-docusate (SENOKOT-S) 8.6-50 MG tablet Take 2 tablets by mouth 2 (two) times daily.     Skin Protectants, Misc. (MINERIN CREME EX) Apply topically daily.     traZODone (DESYREL) 50 MG tablet Take 50 mg by mouth at bedtime.     UNKNOWN TO PATIENT Place 1-2 sprays into the nose daily.  For Degenerative Disc Disease     polyethylene glycol (MIRALAX / GLYCOLAX) 17 g packet Take 17 g by mouth 2 (two) times daily. (Patient not taking: Reported on 03/22/2023) 14 each 0    Home: Home Living Family/patient expects to be discharged to:: Private residence Living Arrangements: Children Available Help at Discharge: Family, Available 24 hours/day Type of Home: House Home Access: Stairs to enter [employer] of Steps: 2 Entrance Stairs-Rails: Right Home Layout: Multi-level, Able to live on main level with bedroom/bathroom Bathroom Shower/Tub: Engineer, manufacturing systems: Standard Bathroom Accessibility: Yes Home Equipment: Agricultural consultant (2 wheels), Grab bars - tub/shower, Medical laboratory scientific officer - single point, Wheelchair - manual, BSC/3in1, Hospital bed, Shower seat, Hand held shower head  Lives With: Daughter  Functional History: Prior Function Prior Level of Function : Needs assist Physical Assist : Mobility (physical), ADLs (physical) Mobility (physical): Bed mobility, Transfers, Gait, Stairs ADLs (physical): Bathing, Dressing, Toileting, IADLs Mobility Comments: [place] ambulation with RW per pt report. ADLs Comments: Assisted by family "a little" for bathing, dressing, and toileting.  Able to feed and groom herself; aides at [facility] assist Functional Status:  Mobility: Bed Mobility Overal bed mobility: Needs Assistance Bed Mobility: Sit to Supine Supine to sit: Total assist Sit to supine: Mod assist General bed mobility comments: assist to get legs into bed and cues for scooting hips Transfers Overall transfer level: Needs assistance Equipment used: Rolling walker (2 wheels) Transfers: Sit to/from Stand Sit to Stand: Max assist, +2 physical assistance Bed to/from chair/wheelchair/BSC transfer type:: Step pivot Squat pivot transfers: Max assist Step pivot transfers: Max assist, +2 physical assistance General transfer comment: stood to RW after two failed attempts wtih +1 A then daughter helped for +2 assist to stand though flexed at hips and support behind hips for balance, scooting steps to move hips around and sit on EOB using RW Ambulation/Gait Ambulation/Gait assistance: Mod assist Gait Distance (Feet): 5 Feet Assistive device: Rolling walker (2 wheels) Gait Pattern/deviations: Decreased step length - right, Decreased step length - left, Decreased stride length General Gait Details: limited to a few slow labored side steps, steps forward/backward at bedside before having to sit due to c/o fatigue and generalized weakness Gait velocity: slow    ADL: ADL Overall ADL's : Needs assistance/impaired Eating/Feeding: Maximal assistance, Sitting Eating/Feeding Details (indicate cue type and reason): simulated Grooming: Maximal assistance, Sitting Grooming Details (indicate cue type and reason): simulated Upper Body Bathing: Maximal assistance, Sitting Upper Body Bathing Details (indicate cue type and reason): simulated Lower Body Bathing: Maximal assistance, +2 for safety/equipment, +2 for physical assistance, Sit to/from stand Lower Body Bathing Details (indicate cue type and reason): simulated Upper Body Dressing : Maximal assistance, Sitting Upper Body Dressing  Details (indicate cue type and reason): simulated Lower Body Dressing: Maximal assistance, Sit to/from stand Lower Body Dressing Details (indicate cue type and reason): simulated Toilet Transfer: Maximal assistance, Squat-pivot Toilet Transfer Details (indicate cue type and reason): simulated to bedside recliner Toileting- Clothing Manipulation and Hygiene: Maximal assistance, +2 for safety/equipment, Sit to/from stand Toileting - Clothing Manipulation Details (indicate cue type and reason): simulated Tub/ Shower Transfer: Maximal assistance, Total assistance, Rolling walker (2 wheels) Functional mobility during ADLs: Maximal assistance (squat pivot to the bedside recliner) General ADL Comments: Pt with slow initiation to movement throughout session during bed mobility or when asked to move UEs for MMT.  Attempted sit to stand but pt unable to maintain secondary to increased right ankle and foot pain.  Completed scoot pivot to the  recliner with max assist and increased time.  Per daughter report, she provides assist with most of bathing, dressing, and toileting tasks but pt was able to transfer over to the [facility] at night on her own to completed toileting in her room.  Cognition: Cognition Overall Cognitive Status: History of cognitive impairments - at baseline Arousal/Alertness: Awake/alert Orientation Level: (P) Oriented X4 Year: 2024 Month: March Day of Week: Correct Attention: Focused, Sustained Focused Attention: Appears intact Sustained Attention: Impaired Sustained Attention Impairment: Verbal complex Memory: Impaired Memory Impairment:  (Immediate: 5/5 with repetition x3; delayed: 4/5; with cue: 1/1; paragraph: 8/8) Awareness: Impaired Awareness Impairment: Emergent impairment Problem Solving: Impaired Problem Solving Impairment: Verbal complex (money: 1/3; time: 0/1) Executive Function: Sequencing, Occupational hygienist: Impaired Sequencing Impairment: Verbal complex (clock:  0/4) Organizing:  (backward digit span: 0/2) Cognition Arousal/Alertness: Awake/alert Behavior During Therapy: Flat affect Overall Cognitive Status: History of cognitive impairments - at baseline  Blood pressure (!) 148/88, pulse 72, temperature 97.9 F (36.6 C), temperature source Oral, resp. rate 19, height 5\' 3"  (1.6 m), weight 76.7 kg, SpO2 95 %. Physical Exam Constitutional:      General: She is not in acute distress.    Appearance: She is obese.     Comments: Masked facies, fairly alert  HENT:     Head: Normocephalic.     Nose: Nose normal.     Mouth/Throat:     Mouth: Mucous membranes are moist.  Eyes:     Extraocular Movements: Extraocular movements intact.     Conjunctiva/sclera: Conjunctivae normal.  Cardiovascular:     Rate and Rhythm: Normal rate.  Pulmonary:     Effort: Pulmonary effort is normal.  Abdominal:     Palpations: Abdomen is soft.  Musculoskeletal:        General: No tenderness.     Cervical back: Normal range of motion.     Comments: Prior bilateral TKA's  Skin:    General: Skin is warm and dry.  Neurological:     Comments: Pt alert.Oriented to person, place.  Follows basic one step commands. Speech dysarthric, low volume. Demonstrates fair awareness.  RUE 2/5 prox to distal. LUE trace/5 prox to distal. RLE 2/5. LLE trace/5 prox to distal. Sensed pain in all 4's. DTr's a bit more brisk on left compared to right. Perhaps mild extensor tone LLE.   Psychiatric:     Comments: Pt flat but cooperative     Results for orders placed or performed during the hospital encounter of 03/22/23 (from the past 24 hour(s))  Glucose, capillary     Status: Abnormal   Collection Time: 03/26/23 11:26 AM  Result Value Ref Range   Glucose-Capillary 207 (H) 70 - 99 mg/dL  Glucose, capillary     Status: Abnormal   Collection Time: 03/26/23  3:49 PM  Result Value Ref Range   Glucose-Capillary 178 (H) 70 - 99 mg/dL  Glucose, capillary     Status: Abnormal   Collection  Time: 03/26/23  7:53 PM  Result Value Ref Range   Glucose-Capillary 128 (H) 70 - 99 mg/dL  Glucose, capillary     Status: Abnormal   Collection Time: 03/26/23 11:43 PM  Result Value Ref Range   Glucose-Capillary 124 (H) 70 - 99 mg/dL  Glucose, capillary     Status: Abnormal   Collection Time: 03/27/23  3:54 AM  Result Value Ref Range   Glucose-Capillary 103 (H) 70 - 99 mg/dL  Magnesium     Status: Abnormal   Collection Time:  03/27/23  4:35 AM  Result Value Ref Range   Magnesium 2.7 (H) 1.7 - 2.4 mg/dL  Comprehensive metabolic panel     Status: Abnormal   Collection Time: 03/27/23  4:35 AM  Result Value Ref Range   Sodium 135 135 - 145 mmol/L   Potassium 3.6 3.5 - 5.1 mmol/L   Chloride 106 98 - 111 mmol/L   CO2 20 (L) 22 - 32 mmol/L   Glucose, Bld 102 (H) 70 - 99 mg/dL   BUN 35 (H) 8 - 23 mg/dL   Creatinine, Ser 1.61 (H) 0.44 - 1.00 mg/dL   Calcium 8.6 (L) 8.9 - 10.3 mg/dL   Total Protein 5.9 (L) 6.5 - 8.1 g/dL   Albumin 2.1 (L) 3.5 - 5.0 g/dL   AST 096 (H) 15 - 41 U/L   ALT 23 0 - 44 U/L   Alkaline Phosphatase 127 (H) 38 - 126 U/L   Total Bilirubin 0.5 0.3 - 1.2 mg/dL   GFR, Estimated 18 (L) >60 mL/min   Anion gap 9 5 - 15  CBC     Status: Abnormal   Collection Time: 03/27/23  4:35 AM  Result Value Ref Range   WBC 13.8 (H) 4.0 - 10.5 K/uL   RBC 3.40 (L) 3.87 - 5.11 MIL/uL   Hemoglobin 10.4 (L) 12.0 - 15.0 g/dL   HCT 04.5 (L) 40.9 - 81.1 %   MCV 88.5 80.0 - 100.0 fL   MCH 30.6 26.0 - 34.0 pg   MCHC 34.6 30.0 - 36.0 g/dL   RDW 91.4 78.2 - 95.6 %   Platelets 268 150 - 400 K/uL   nRBC 0.0 0.0 - 0.2 %  Glucose, capillary     Status: Abnormal   Collection Time: 03/27/23  7:25 AM  Result Value Ref Range   Glucose-Capillary 198 (H) 70 - 99 mg/dL   VAS US CAROTID  Result Date: 03/27/2023 Carotid Arterial Duplex Study Patient Name:  Melissa Lopez  Date of Exam:   03/26/2023 Medical Rec #: [id number]         Accession #:    [id number] Date of Birth: [birth year]          Patient Gender: F Patient Age:   68 years Exam Location:  [facility] Procedure:      VAS US CAROTID Referring Phys: Scheryl Marten XU --------------------------------------------------------------------------------  Indications:       CVA and Left sided weakness. Risk Factors:      Hypertension, hyperlipidemia, Diabetes, coronary artery                    disease, prior CVA. Comparison Study:  04/17/20 Performing Technologist: Marilynne Halsted RDMS, RVT  Examination Guidelines: A complete evaluation includes B-mode imaging, spectral Doppler, color Doppler, and power Doppler as needed of all accessible portions of each vessel. Bilateral testing is considered an integral part of a complete examination. Limited examinations for reoccurring indications may be performed as noted.  Right Carotid Findings: +----------+--------+--------+--------+---------------------+------------------+           PSV cm/sEDV cm/sStenosisPlaque Description   Comments           +----------+--------+--------+--------+---------------------+------------------+ CCA Prox  130     18                                                      +----------+--------+--------+--------+---------------------+------------------+  CCA Mid   77      20                                                      +----------+--------+--------+--------+---------------------+------------------+ CCA Distal64      18                                   intimal thickening +----------+--------+--------+--------+---------------------+------------------+ ICA Prox  76      30      1-39%   irregular and        intimal thickening                                   calcific                                +----------+--------+--------+--------+---------------------+------------------+ ICA Mid   106     32                                   tortuous           +----------+--------+--------+--------+---------------------+------------------+  ICA Distal63      25                                                      +----------+--------+--------+--------+---------------------+------------------+ ECA       104     20                                                      +----------+--------+--------+--------+---------------------+------------------+ +----------+--------+-------+----------------+-------------------+           PSV cm/sEDV cmsDescribe        Arm Pressure (mmHG) +----------+--------+-------+----------------+-------------------+ Subclavian101            Multiphasic, WNL                    +----------+--------+-------+----------------+-------------------+ +---------+--------+--+--------+--+---------+ VertebralPSV cm/s39EDV cm/s17Antegrade +---------+--------+--+--------+--+---------+  Left Carotid Findings: +----------+--------+--------+--------+------------------+------------------+           PSV cm/sEDV cm/sStenosisPlaque DescriptionComments           +----------+--------+--------+--------+------------------+------------------+ CCA Prox  76      20                                                   +----------+--------+--------+--------+------------------+------------------+ CCA Distal51      20                                                   +----------+--------+--------+--------+------------------+------------------+  ICA Prox  68      22      1-39%                     intimal thickening +----------+--------+--------+--------+------------------+------------------+ ICA Mid   67      24                                                   +----------+--------+--------+--------+------------------+------------------+ ICA Distal57      24                                                   +----------+--------+--------+--------+------------------+------------------+ ECA       87      16                                                    +----------+--------+--------+--------+------------------+------------------+ +----------+--------+--------+----------------+-------------------+           PSV cm/sEDV cm/sDescribe        Arm Pressure (mmHG) +----------+--------+--------+----------------+-------------------+ Subclavian182             Multiphasic, WNL                    +----------+--------+--------+----------------+-------------------+ +---------+--------+--+--------+--+---------+ VertebralPSV cm/s32EDV cm/s12Antegrade +---------+--------+--+--------+--+---------+   Summary: Right Carotid: Velocities in the right ICA are consistent with a 1-39% stenosis. Left Carotid: Velocities in the left ICA are consistent with a 1-39% stenosis. Vertebrals:  Bilateral vertebral arteries demonstrate antegrade flow. Subclavians: Normal flow hemodynamics were seen in bilateral subclavian              arteries. *See table(s) above for measurements and observations.  Electronically signed by Delia Heady MD on 03/27/2023 at 7:12:36 AM.    Final    CT HEAD WO CONTRAST ( )  Result Date: 03/26/2023 CLINICAL DATA:  72 year old female status post code stroke presentation yesterday. Left side weakness. Small acute right thalamic hemorrhage. EXAM: CT HEAD WITHOUT CONTRAST TECHNIQUE: Contiguous axial images were obtained from the base of the skull through the vertex without intravenous contrast. RADIATION DOSE REDUCTION: This exam was performed according to the departmental dose-optimization program which includes automated exposure control, adjustment of the mA and/or kV according to patient size and/or use of iterative reconstruction technique. COMPARISON:  Head CT 03/25/2023. FINDINGS: Brain: Small hyperdense, bilobed intra-axial hemorrhage of the right thalamus is stable from yesterday (sagittal images 23 and 24), still suspected at less than 1 mL volume. Possible regional edema but no significant mass effect. Underlying advanced chronic small  vessel disease with confluent hypodensity in the bilateral cerebral white matter and scattered hypodensity in the bilateral deep gray nuclei (left basal ganglia is relatively spared). No IVH or ventriculomegaly.  No new intracranial abnormality. Vascular: Calcified atherosclerosis at the skull base. No suspicious intracranial vascular hyperdensity. Skull: No acute osseous abnormality identified. Sinuses/Orbits: Stable bilateral paranasal sinus mucosal thickening and opacification. Bilateral mastoid opacification is stable. Other: No acute orbit or scalp soft tissue finding. IMPRESSION: 1. Stable small bilobed right thalamic hemorrhage. Possible surrounding edema but no significant mass effect.  No intraventricular extension or other complicating features. 2. Underlying advanced small vessel disease. 3. No new intracranial abnormality. Electronically Signed   By: Odessa Fleming M.D.   On: 03/26/2023 06:21   CT HEAD CODE STROKE WO CONTRAST  Result Date: 03/25/2023 CLINICAL DATA:  Code stroke. 72 year old female with left side weakness beginning yesterday. EXAM: CT HEAD WITHOUT CONTRAST TECHNIQUE: Contiguous axial images were obtained from the base of the skull through the vertex without intravenous contrast. RADIATION DOSE REDUCTION: This exam was performed according to the departmental dose-optimization program which includes automated exposure control, adjustment of the mA and/or kV according to patient size and/or use of iterative reconstruction technique. COMPARISON:  Head CT 06/13/2021. FINDINGS: Brain: Small volume hyperdense hemorrhage in the right thalamus is new, superimposed on chronic bilateral basal ganglia heterogeneity and confluent bilateral cerebral white matter hypodensity. Oval, bilobed right thalamic intra-axial blood products encompassing 12 by 7 by 6 mm (AP by transverse by CC), volume estimated at less than 1 mL. Surrounding hypodensity appears increased since 2022 and is probably in part edema.  Other cerebral gray-white matter differentiation does not appear significantly changed from the prior. No intraventricular hemorrhage, ventriculomegaly, intracranial mass effect, acute cortically based infarct. Vascular: Calcified atherosclerosis at the skull base. No suspicious intracranial vascular hyperdensity. Skull: No acute osseous abnormality identified. Chronic hyperostosis. Sinuses/Orbits: Increased inflammatory appearing paranasal sinus opacification and mucosal thickening. No sinus hemorrhage identified. New bilateral mastoid air cell opacification also. Other: No acute orbit or scalp soft tissue finding. ASPECTS [facility] Stroke Program Early CT Score) Total score (0-10 with 10 being normal): Not applicable, small acute thalamic hemorrhage. IMPRESSION: 1. Small acute right thalamic hemorrhage, volume less than 1 mL. Evidence of mild surrounding edema but no significant mass effect or other complicating features. 2. Underlying advanced chronic small vessel disease, otherwise not significantly changed from 2022. Study discussed by telephone with Dr. Ritta Slot on 03/25/2023 at 10:57 . Electronically Signed   By: Odessa Fleming M.D.   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  Worsening symptoms this morning? CT without acute changes. Does the need for close, 24 hr/day medical supervision in concert with the patient's rehab needs make it unreasonable for this patient to be served in a less intensive setting? Yes and Potentially Co-Morbidities requiring supervision/potential complications:  -Parkinson's Disease -hx of PE on eliquis -run of SVT today -AKI on CKD IIIb Due to bladder management, bowel management, safety, skin/wound care, disease management, medication administration, pain management, and patient education, does the patient require 24 hr/day rehab nursing? Yes and Potentially Does the patient require  coordinated care of a physician, rehab nurse, therapy disciplines of PT, OT, SLP to address physical and functional deficits in the context of the above medical diagnosis(es)? Yes Addressing deficits in the following areas: balance, endurance, locomotion, strength, transferring, bowel/bladder control, bathing, dressing, feeding, grooming, toileting, cognition, speech, swallowing, and psychosocial support Can the patient actively participate in an intensive therapy program of at least 3 hrs of therapy per day at least 5 days per week? Yes and Potentially The potential for patient to make measurable gains while on inpatient rehab is good Anticipated functional outcomes upon discharge from inpatient rehab are mod assist  with PT, mod assist with OT, min assist and mod assist with SLP. Estimated rehab length of stay to reach the above functional goals is: 18-25 days Anticipated discharge destination: Home Overall Rehab/Functional Prognosis: good  POST ACUTE  RECOMMENDATIONS: This patient's condition is appropriate for continued rehabilitative care in the following setting:  likely inpatient rehab Patient has agreed to participate in recommended program. Yes and Potentially Note that insurance prior authorization may be required for reimbursement for recommended care.  Comment: Spoke with daughter at length. We discussed Mrs Bochenek's prior level of function. Family helped her with dressing/bathing/toileting with varying degrees of assist PTA. She used a RW in the home and a w/c in the community. Daughter will NOT send her to a SNF.  The patient, however, will need to demonstrate some activity tolerance to justify an inpatient rehab admission. I suspect that family will need to be heavily involved for education. Rehab Admissions Coordinator to follow up       I have personally performed a face to face diagnostic evaluation of this patient. Additionally, I have examined the patient's medical record  including any pertinent labs and radiographic images. If the physician assistant has documented in this note, I have reviewed and edited or otherwise concur with the physician assistant's documentation.  Thanks,  Ranelle Oyster, MD 03/27/2023

## 2023-03-27 NOTE — Progress Notes (Signed)
NAME:  Melissa Lopez, MRN:  [id number], DOB:  1951/07/03, LOS: 5 ADMISSION DATE:  03/22/2023, CONSULTATION DATE:  4/16 REFERRING MD:  Dr. Nelson Chimes, CHIEF COMPLAINT:  hematuria; now w/ R thalamic bleed   History of Present Illness:  72 yo female with hx of CKD 3b, PE in 2019 on eliquis and Parkinson's disease presented to [facility] with hematuria and frequent falling.  She was found to have acute renal failure with creatinine 3.04.  Renal CT showed Lt hydronephrosis and blood clot in renal pelvis.  She was started on antibiotics, and seen by nephrology and urology.  While at [facility] she developed Lt arm weakness on 03/24/23.  Code stroke called and CT head showed acute Rt thalamic hemorrhage.  She had elevated blood pressure.  She was started on cleviprex and transferred to [facility] for further management with neurology.  PCCM assumed medical care in the ICU.  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  4/16 code stroke; CT head showing acute right thalamic hemorrhage; started on Cleviprex; transferred to [facility]  Interim History / Subjective:  Back on cleviprex overnight.  Her daughter says she seems more sluggish this morning.  Objective   Blood pressure (!) 148/88, pulse 72, temperature 97.9 F (36.6 C), temperature source Oral, resp. rate 19, height  (1.6 m), weight 76.7 kg, SpO2 95 %.        Intake/Output Summary (Last 24 hours) at 03/27/2023 0923 Last data filed at 03/27/2023 4098 Gross per 24 hour  Intake 329.6 ml  Output 1550 ml  Net -1220.4 ml   Filed Weights   03/22/23 2300 03/25/23 1127 03/26/23 0204   Weight: 75.5 kg 71.9 kg 76.7 kg    Examination:  General - sleepy Eyes - pupils reactive ENT - no sinus tenderness, no stridor Cardiac - regular rate/rhythm, no murmur Chest - equal breath sounds b/l, no wheezing or rales Abdomen - soft, non tender, + bowel sounds Extremities - no cyanosis, clubbing, or edema Skin - no rashes Neuro - weak on Lt side more than Rt, follows commands, able to move all extremities   Resolved Hospital Problem list     Assessment & Plan:   Acute Rt thalamic ICH with hx of CVA. Hx of Parkinson's disease, Depression. - per neurology - f/u MRI brain - continue sinemet, flexeril, prozac, neurontin, remeron   HTN emergency. CAD, SSS s/p PM, Chronic diastolic CHF, HLD. - continue cleviprex as needed for goal SBP < 160 - will ask cardiology to interrogate pacemaker - continue norvasc, coreg, hydralazine, crestor  Lt hydronephrosis with hematuria with concern for hemorrhage in cyst and clot in Lt ureter. CKD 3b, Polycystic kidney disease. Non gap metabolic acidosis. - improving - monitor urine outpt - f/u BMET - continue sodium bicarbonate pills for now  Lt pyelonephritis. - day 6 of rocephin   Hx of PE from 2019 on eliquis. - holding anticoagulation until okay with neurology   DM type 2 with gastroparesis, nephropathy and peripheral neuropathy. - SSI - continue reglan  Lt wrist pain from pseudogout. - prn analgesics  Secondary hyperparathyroidism - continue sensipar  Anemia from blood loss related  to hematuria and chronic disease. - f/u CBC - transfuse for Hb < 7  Best Practice (right click and "Reselect all SmartList Selections" daily)   Diet/type: Regular consistency (see orders) DVT prophylaxis: SCD GI prophylaxis: N/A Lines: N/A Foley:  N/A Code Status:  full code Last date of multidisciplinary goals of care discussion [updated her daughter at bedside on 03/27/23]  Labs       Latest Ref Rng & Units 03/27/2023    4:35  AM 03/26/2023    2:28 AM 03/25/2023    4:46 AM  CMP  Glucose 70 - 99 mg/dL 115  726  203   BUN 8 - 23 mg/dL 35  36  37   Creatinine 0.44 - 1.00 mg/dL 5.59  7.41  6.38   Sodium 135 - 145 mmol/L 135  135  132   Potassium 3.5 - 5.1 mmol/L 3.6  3.2  3.5   Chloride 98 - 111 mmol/L 106  100  104   CO2 22 - 32 mmol/L 20  20  19    Calcium 8.9 - 10.3 mg/dL 8.6  8.4  8.4   Total Protein 6.5 - 8.1 g/dL 5.9     Total Bilirubin 0.3 - 1.2 mg/dL 0.5     Alkaline Phos 38 - 126 U/L 127     AST 15 - 41 U/L 244     ALT 0 - 44 U/L 23          Latest Ref Rng & Units 03/27/2023    4:35 AM 03/26/2023    2:28 AM 03/25/2023    4:46 AM  CBC  WBC 4.0 - 10.5 K/uL 13.8  18.2  22.8   Hemoglobin 12.0 - 15.0 g/dL 45.3  64.6  80.3   Hematocrit 36.0 - 46.0 % 30.1  31.5  34.0   Platelets 150 - 400 K/uL 268  214  205    CBG (last 3)  Recent Labs    03/26/23 2343 03/27/23 0354 03/27/23 0725  GLUCAP 124* 103* 198*    Critical care time: 33 minutes  Coralyn Helling, MD [place] [facility] Pager - [phone] or [phone] 03/27/2023, 9:23 AM

## 2023-03-28 DIAGNOSIS — N179 Acute kidney failure, unspecified: Secondary | ICD-10-CM | POA: Diagnosis not present

## 2023-03-28 LAB — BASIC METABOLIC PANEL
Anion gap: 10 (ref 5–15)
BUN: 35 mg/dL — ABNORMAL HIGH (ref 8–23)
CO2: 22 mmol/L (ref 22–32)
Calcium: 9.5 mg/dL (ref 8.9–10.3)
Chloride: 105 mmol/L (ref 98–111)
Creatinine, Ser: 2.41 mg/dL — ABNORMAL HIGH (ref 0.44–1.00)
GFR, Estimated: 21 mL/min — ABNORMAL LOW (ref >60)
Glucose, Bld: 109 mg/dL — ABNORMAL HIGH (ref 70–99)
Potassium: 3.7 mmol/L (ref 3.5–5.1)
Sodium: 137 mmol/L (ref 135–145)

## 2023-03-28 LAB — GLUCOSE, CAPILLARY
Glucose-Capillary: 113 mg/dL — ABNORMAL HIGH (ref 70–99)
Glucose-Capillary: 142 mg/dL — ABNORMAL HIGH (ref 70–99)
Glucose-Capillary: 120 mg/dL — ABNORMAL HIGH (ref 70–99)
Glucose-Capillary: 146 mg/dL — ABNORMAL HIGH (ref 70–99)

## 2023-03-28 LAB — CBC
HCT: 31 % — ABNORMAL LOW (ref 36.0–46.0)
Hemoglobin: 10.4 g/dL — ABNORMAL LOW (ref 12.0–15.0)
MCH: 30.3 pg (ref 26.0–34.0)
MCHC: 33.5 g/dL (ref 30.0–36.0)
MCV: 90.4 fL (ref 80.0–100.0)
Platelets: 297 10*3/uL (ref 150–400)
RBC: 3.43 MIL/uL — ABNORMAL LOW (ref 3.87–5.11)
RDW: 12.8 % (ref 11.5–15.5)
WBC: 14.5 10*3/uL — ABNORMAL HIGH (ref 4.0–10.5)
nRBC: 0 % (ref 0.0–0.2)

## 2023-03-28 LAB — MAGNESIUM: Magnesium: 2.2 mg/dL (ref 1.7–2.4)

## 2023-03-28 MED ORDER — POTASSIUM CHLORIDE 20 MEQ PO PACK
40.0000 meq | PACK | Freq: Once | ORAL | Status: AC
  Administered 2023-03-28: 40 meq via ORAL
  Filled 2023-03-28: qty 2

## 2023-03-28 NOTE — Progress Notes (Signed)
STROKE TEAM PROGRESS NOTE   SUBJECTIVE (INTERVAL HISTORY) Her daughter and Dr. Pola Corn are at the bedside. Pt sitting in chair, awake alert, neuro stable. Hematuria has resolved, creatinine continue trending down.  Yesterday CT repeat stable.  Patient transfer out of ICU, pending CIR.  OBJECTIVE Temp:  [97.9 F (36.6 C)-98.7 F (37.1 C)] 98.3 F (36.8 C) (04/19 1935) Pulse Rate:  [66-83] 69 (04/19 1935) Cardiac Rhythm: Atrial paced (04/19 1444) Resp:  [13-21] 20 (04/19 1935) BP: (114-175)/(68-100) 139/75 (04/19 1935) SpO2:  [94 %-97 %] 95 % (04/19 1935)  Recent Labs  Lab 03/27/23 1523 03/27/23 2142 03/28/23 0804 03/28/23 1202 03/28/23 1650  GLUCAP 245* 111* 113* 142* 120*   Recent Labs  Lab 03/24/23 0619 03/25/23 0446 03/26/23 0228 03/27/23 0435 03/28/23 0345  NA 135 132* 135 135 137  K 3.9 3.5 3.2* 3.6 3.7  CL 107 104 100 106 105  CO2 21* 19* 20* 20* 22  GLUCOSE 107* 132* 144* 102* 109*  BUN 36* 37* 36* 35* 35*  CREATININE 3.91* 3.55* 2.95* 2.68* 2.41*  CALCIUM 8.1* 8.4* 8.4* 8.6* 9.5  MG  --   --  1.5* 2.7* 2.2   Recent Labs  Lab 03/27/23 0435  AST 244*  ALT 23  ALKPHOS 127*  BILITOT 0.5  PROT 5.9*  ALBUMIN 2.1*   Recent Labs  Lab 03/24/23 0619 03/25/23 0446 03/26/23 0228 03/27/23 0435 03/28/23 0345  WBC 22.0* 22.8* 18.2* 13.8* 14.5*  HGB 10.5* 11.1* 10.8* 10.4* 10.4*  HCT 32.0* 34.0* 31.5* 30.1* 31.0*  MCV 93.0 92.6 89.5 88.5 90.4  PLT 189 205 214 268 297   Recent Labs  Lab 03/22/23 1911  CKTOTAL 33*   No results for input(s): "LABPROT", "INR" in the last 72 hours. No results for input(s): "COLORURINE", "LABSPEC", "PHURINE", "GLUCOSEU", "HGBUR", "BILIRUBINUR", "KETONESUR", "PROTEINUR", "UROBILINOGEN", "NITRITE", "LEUKOCYTESUR" in the last 72 hours.  Invalid input(s): "APPERANCEUR"     Component Value Date/Time   CHOL 66 03/26/2023 0228   TRIG 114 03/26/2023 0228   HDL 29 (L) 03/26/2023 0228   CHOLHDL 2.3 03/26/2023 0228   VLDL 23  03/26/2023 0228   LDLCALC 14 03/26/2023 0228   Lab Results  Component Value Date   HGBA1C 5.2 03/22/2023   No results found for: "LABOPIA", "COCAINSCRNUR", "LABBENZ", "AMPHETMU", "THCU", "LABBARB"  No results for input(s): "ETH" in the last 168 hours.  I have personally reviewed the radiological images below and agree with the radiology interpretations.  CT HEAD WO CONTRAST ( )  Result Date: 03/27/2023 CLINICAL DATA:  Stroke, hemorrhagic. EXAM: CT HEAD WITHOUT CONTRAST TECHNIQUE: Contiguous axial images were obtained from the base of the skull through the vertex without intravenous contrast. RADIATION DOSE REDUCTION: This exam was performed according to the departmental dose-optimization program which includes automated exposure control, adjustment of the mA and/or kV according to patient size and/or use of iterative reconstruction technique. COMPARISON:  Head CT 03/26/2023. FINDINGS: Brain: Unchanged right thalamic hemorrhage with surrounding vasogenic edema. No new intracranial hemorrhage. Unchanged severe chronic small-vessel disease with multifocal lacunar infarcts. No new loss of cortical gray-white differentiation. No hydrocephalus or extra-axial collection. No mass effect or midline shift. Vascular: No hyperdense vessel or unexpected calcification. Skull: No calvarial fracture or suspicious bone lesion. Skull base is unremarkable. Sinuses/Orbits: Moderate paranasal sinus disease. Orbits are unremarkable. Other: None. IMPRESSION: 1. Unchanged right thalamic hemorrhage with surrounding vasogenic edema. No new intracranial hemorrhage. 2. Unchanged severe chronic small-vessel disease. Electronically Signed   By: Elwyn Reach.D.  On: 03/27/2023 10:31   VAS US CAROTID  Result Date: 03/27/2023 Carotid Arterial Duplex Study Patient Name:  Melissa Lopez  Date of Exam:   03/26/2023 Medical Rec #: [id number]         Accession #:    [id number] Date of Birth: [birth year]         Patient Gender: F  Patient Age:   15 years Exam Location:  [facility] Procedure:      VAS US CAROTID Referring Phys: Scheryl Marten Merna Baldi --------------------------------------------------------------------------------  Indications:       CVA and Left sided weakness. Risk Factors:      Hypertension, hyperlipidemia, Diabetes, coronary artery                    disease, prior CVA. Comparison Study:  04/17/20 Performing Technologist: Marilynne Halsted RDMS, RVT  Examination Guidelines: A complete evaluation includes B-mode imaging, spectral Doppler, color Doppler, and power Doppler as needed of all accessible portions of each vessel. Bilateral testing is considered an integral part of a complete examination. Limited examinations for reoccurring indications may be performed as noted.  Right Carotid Findings: +----------+--------+--------+--------+---------------------+------------------+           PSV cm/sEDV cm/sStenosisPlaque Description   Comments           +----------+--------+--------+--------+---------------------+------------------+ CCA Prox  130     18                                                      +----------+--------+--------+--------+---------------------+------------------+ CCA Mid   77      20                                                      +----------+--------+--------+--------+---------------------+------------------+ CCA Distal64      18                                   intimal thickening +----------+--------+--------+--------+---------------------+------------------+ ICA Prox  76      30      1-39%   irregular and        intimal thickening                                   calcific                                +----------+--------+--------+--------+---------------------+------------------+ ICA Mid   106     32                                   tortuous           +----------+--------+--------+--------+---------------------+------------------+ ICA Distal63       25                                                      +----------+--------+--------+--------+---------------------+------------------+  ECA       104     20                                                      +----------+--------+--------+--------+---------------------+------------------+ +----------+--------+-------+----------------+-------------------+           PSV cm/sEDV cmsDescribe        Arm Pressure (mmHG) +----------+--------+-------+----------------+-------------------+ Subclavian101            Multiphasic, WNL                    +----------+--------+-------+----------------+-------------------+ +---------+--------+--+--------+--+---------+ VertebralPSV cm/s39EDV cm/s17Antegrade +---------+--------+--+--------+--+---------+  Left Carotid Findings: +----------+--------+--------+--------+------------------+------------------+           PSV cm/sEDV cm/sStenosisPlaque DescriptionComments           +----------+--------+--------+--------+------------------+------------------+ CCA Prox  76      20                                                   +----------+--------+--------+--------+------------------+------------------+ CCA Distal51      20                                                   +----------+--------+--------+--------+------------------+------------------+ ICA Prox  68      22      1-39%                     intimal thickening +----------+--------+--------+--------+------------------+------------------+ ICA Mid   67      24                                                   +----------+--------+--------+--------+------------------+------------------+ ICA Distal57      24                                                   +----------+--------+--------+--------+------------------+------------------+ ECA       87      16                                                    +----------+--------+--------+--------+------------------+------------------+ +----------+--------+--------+----------------+-------------------+           PSV cm/sEDV cm/sDescribe        Arm Pressure (mmHG) +----------+--------+--------+----------------+-------------------+ Subclavian182             Multiphasic, WNL                    +----------+--------+--------+----------------+-------------------+ +---------+--------+--+--------+--+---------+ VertebralPSV cm/s32EDV cm/s12Antegrade +---------+--------+--+--------+--+---------+   Summary: Right Carotid: Velocities in the right ICA are consistent with a 1-39% stenosis. Left Carotid: Velocities in the left ICA are consistent with a 1-39%  stenosis. Vertebrals:  Bilateral vertebral arteries demonstrate antegrade flow. Subclavians: Normal flow hemodynamics were seen in bilateral subclavian              arteries. *See table(s) above for measurements and observations.  Electronically signed by Delia Heady MD on 03/27/2023 at 7:12:36 AM.    Final    CT HEAD WO CONTRAST ( )  Result Date: 03/26/2023 CLINICAL DATA:  72 year old female status post code stroke presentation yesterday. Left side weakness. Small acute right thalamic hemorrhage. EXAM: CT HEAD WITHOUT CONTRAST TECHNIQUE: Contiguous axial images were obtained from the base of the skull through the vertex without intravenous contrast. RADIATION DOSE REDUCTION: This exam was performed according to the departmental dose-optimization program which includes automated exposure control, adjustment of the mA and/or kV according to patient size and/or use of iterative reconstruction technique. COMPARISON:  Head CT 03/25/2023. FINDINGS: Brain: Small hyperdense, bilobed intra-axial hemorrhage of the right thalamus is stable from yesterday (sagittal images 23 and 24), still suspected at less than 1 mL volume. Possible regional edema but no significant mass effect. Underlying advanced chronic small  vessel disease with confluent hypodensity in the bilateral cerebral white matter and scattered hypodensity in the bilateral deep gray nuclei (left basal ganglia is relatively spared). No IVH or ventriculomegaly.  No new intracranial abnormality. Vascular: Calcified atherosclerosis at the skull base. No suspicious intracranial vascular hyperdensity. Skull: No acute osseous abnormality identified. Sinuses/Orbits: Stable bilateral paranasal sinus mucosal thickening and opacification. Bilateral mastoid opacification is stable. Other: No acute orbit or scalp soft tissue finding. IMPRESSION: 1. Stable small bilobed right thalamic hemorrhage. Possible surrounding edema but no significant mass effect. No intraventricular extension or other complicating features. 2. Underlying advanced small vessel disease. 3. No new intracranial abnormality. Electronically Signed   By: Odessa Fleming M.D.   On: 03/26/2023 06:21   CT HEAD CODE STROKE WO CONTRAST  Result Date: 03/25/2023 CLINICAL DATA:  Code stroke. 72 year old female with left side weakness beginning yesterday. EXAM: CT HEAD WITHOUT CONTRAST TECHNIQUE: Contiguous axial images were obtained from the base of the skull through the vertex without intravenous contrast. RADIATION DOSE REDUCTION: This exam was performed according to the departmental dose-optimization program which includes automated exposure control, adjustment of the mA and/or kV according to patient size and/or use of iterative reconstruction technique. COMPARISON:  Head CT 06/13/2021. FINDINGS: Brain: Small volume hyperdense hemorrhage in the right thalamus is new, superimposed on chronic bilateral basal ganglia heterogeneity and confluent bilateral cerebral white matter hypodensity. Oval, bilobed right thalamic intra-axial blood products encompassing 12 by 7 by 6 mm (AP by transverse by CC), volume estimated at less than 1 mL. Surrounding hypodensity appears increased since 2022 and is probably in part edema.  Other cerebral gray-white matter differentiation does not appear significantly changed from the prior. No intraventricular hemorrhage, ventriculomegaly, intracranial mass effect, acute cortically based infarct. Vascular: Calcified atherosclerosis at the skull base. No suspicious intracranial vascular hyperdensity. Skull: No acute osseous abnormality identified. Chronic hyperostosis. Sinuses/Orbits: Increased inflammatory appearing paranasal sinus opacification and mucosal thickening. No sinus hemorrhage identified. New bilateral mastoid air cell opacification also. Other: No acute orbit or scalp soft tissue finding. ASPECTS [facility] Stroke Program Early CT Score) Total score (0-10 with 10 being normal): Not applicable, small acute thalamic hemorrhage. IMPRESSION: 1. Small acute right thalamic hemorrhage, volume less than 1 mL. Evidence of mild surrounding edema but no significant mass effect or other complicating features. 2. Underlying advanced chronic small vessel disease, otherwise not significantly changed from 2022. Study discussed  by telephone with Dr. Ritta Slot on 03/25/2023 at 10:57 . Electronically Signed   By: Odessa Fleming M.D.   On: 03/25/2023 11:02   DG Wrist Complete Left  Result Date: 03/25/2023 CLINICAL DATA:  86310 Swelling 95188 EXAM: LEFT WRIST - COMPLETE 3+ VIEW COMPARISON:  None Available. FINDINGS: There is no evidence of acute fracture. Alignment is normal. There is mild radiocarpal and base of thumb osteoarthritis. There is radiocarpal, DRUJ, TFCC, intercarpal, CMC, and MCP joint chondrocalcinosis. No definite bony erosions. There is diffuse soft tissue swelling of the wrist. IMPRESSION: No acute osseous abnormality. Diffuse soft tissue swelling of the wrist. Extensive chondrocalcinosis suggestive of CPPD arthropathy. No evidence of bone erosion. Mild radiocarpal and base of thumb degenerative change. Electronically Signed   By: Caprice Renshaw M.D.   On: 03/25/2023 10:22   CT Renal  Stone Study  Result Date: 03/22/2023 CLINICAL DATA:  Abdominal/flank pain, stone suspected EXAM: CT ABDOMEN AND PELVIS WITHOUT CONTRAST TECHNIQUE: Multidetector CT imaging of the abdomen and pelvis was performed following the standard protocol without IV contrast. RADIATION DOSE REDUCTION: This exam was performed according to the departmental dose-optimization program which includes automated exposure control, adjustment of the mA and/or kV according to patient size and/or use of iterative reconstruction technique. COMPARISON:  CT 08/01/2021 FINDINGS: Lower chest: Trace left pleural thickening. Mild dependent atelectasis. Pacemaker wires partially included. Hepatobiliary: Pneumobilia which is intermittently been present on prior exam. Cholecystectomy. No evidence of focal liver lesion. Pancreas: Parenchymal atrophy. No ductal dilatation or inflammation. Spleen: Normal in size without focal abnormality. Adrenals/Urinary Tract: No adrenal nodule. Again seen polycystic appearance of both kidneys with cysts of varying sizes. One of the cysts in the upper pole of the left kidney is now hyperdense likely representing interval hemorrhage. There is left hydronephrosis that is new from prior exam. Diffusely increased density within the left renal collecting system which has a slightly rounded appearance in the renal pelvis measuring 2.7 cm, series 2, image 39. This is favored to represent hemorrhage and blood clot. Some hyperdense material is seen within the left ureter. No evidence of ureteral stone or obstruction. There is moderate left perinephric edema, with Peri ureteric fat stranding. No right hydronephrosis. No right renal calculi. Partially distended urinary bladder. There is no bladder stone. Stomach/Bowel: No bowel obstruction or inflammation. The appendix is not definitively seen. Vascular/Lymphatic: Aortic atherosclerosis. No aneurysm. There are small left retroperitoneal lymph nodes, likely reactive.  Reproductive: Status post hysterectomy. No adnexal masses. Other: No free air or free fluid. No abdominal wall hernia. Musculoskeletal: Degenerative change of the hips and lumbar spine. There are no acute or suspicious osseous abnormalities. IMPRESSION: 1. Left hydronephrosis with diffuse increased density within the left renal collecting system and ureter, which has a rounded appearance in the renal pelvis. This is favored to represent hemorrhage and blood clot. The possibility of infection is also considered. No evidence of ureteral stone or obstruction. Recommend follow-up after course of treatment to ensure resolution, as renal collecting system mass is not entirely excluded. 2. Polycystic appearance of both kidneys with cysts of varying sizes. One of the cysts in the upper pole of the left kidney is now hyperdense, likely representing interval hemorrhage. Aortic Atherosclerosis (ICD10-I70.0). Electronically Signed   By: Narda Rutherford M.D.   On: 03/22/2023 19:25     PHYSICAL EXAM  Temp:  [97.9 F (36.6 C)-98.7 F (37.1 C)] 98.3 F (36.8 C) (04/19 1935) Pulse Rate:  [66-83] 69 (04/19 1935) Resp:  [13-21] 20 (  04/19 1935) BP: (114-175)/(68-100) 139/75 (04/19 1935) SpO2:  [94 %-97 %] 95 % (04/19 1935)  General - Well nourished, well developed, in no apparent distress, lethargic.  Ophthalmologic - fundi not visualized due to noncooperation.  Cardiovascular - Regular rhythm and rate.  Neuro - awake, alert, eyes open, but lethargic, orientated to age, place, year and people but not to month. No aphasia, however, paucity of speech, able to answer questions use words not sentences, following all simple commands. Able to name and repeat. Mild dysarthria. Significant psychomotor slowing. No gaze palsy, tracking bilaterally, visual field full, PERRL. No significant facial droop. Tongue midline. RUE 4/5 without drift, LUE 2+/5 proximal and 2/5 distal hand movement. Significant pain with passive  movement of left arm. RLE 3/5 proximal and distal, LLE 2+/5 proximal and barely movement of toes. Sensation symmetrical bilaterally subjectively, right FTN no ataxic but not able to touch nose, gait not tested. R hand mild resting tremor.    ASSESSMENT/PLAN Melissa Lopez is a 71 y.o. female with history of CKD 4, CAD 2019, hypertension, pacemaker, SSS status post pacemaker, diabetes, history of PE on Eliquis, Parkinson disease with frequent falls admitted for AKI, left hydronephrosis with hematuria, and renal pelvis hematoma. No tPA given due to ICH.    ICH:  right thalamic ICH, secondary to hypertension in setting of Eliquis use CT head right thalamic small ICH CT repeat 4/17 stable small thalamic ICH MRI not able to do due to pacer CT repeat stable  hematoma Carotid Doppler unremarkable 2D Echo EF 65 to 70% in 02/2022 Pacemaker irrigation no A-fib, questionable SVT vs. aflutter episode for 10 seconds on 07/12/22 LDL 14 HgbA1c 5.2 SCDs for VTE prophylaxis Eliquis (apixaban) 2.5 twice daily and aspirin 81 prior to admission, now on no antithrombotics given ICH and renal hematoma.  Ongoing aggressive stroke risk factor management Therapy recommendations: CIR Disposition: Pending  Renal pelvis hematoma due to fall Hematuria AKI on CKD with left hydronephrosis UTI CT renal study Left hydronephrosis with diffuse increased density within the left renal collecting system and ureter, which has a rounded appearance in the renal pelvis. This is favored to represent hemorrhage and blood clot.  UA WBC 6-10, RBC >50.  No gross hematuria now On Rocephin Creatinine 3.04-3.45-3.91-3.55-2.68-2.41 On sodium bicarb 650 TID CCM on board  History of PE on Eliquis Per daughter PE happened in 46s On low-dose Eliquis 2.5 twice daily PTA Not sure if patient still needed anticoagulation for history of PE Consider stop Eliquis in the future if not needed given history of ICH and present disease with  frequent falls  Parkinson disease with frequent falls Resume home Sinemet Fall precaution Follow-up with [facility] Dr. Marjory Lies Continue outpatient follow-up  Diabetes HgbA1c 5.2 goal < 7.0 Controlled CBG monitoring SSI DM education and close PCP follow up  Hypertension Unstable On Cleviprex, wean off as able BP goal less than 160 Now on Coreg 12.5 twice daily, hydralazine 50 3 times daily Long term BP goal normotensive  Hyperlipidemia Home meds: Crestor 40 LDL 14, goal < 70 Now on Crestor 20 due to low LDL Continue statin at discharge  Other Stroke Risk Factors Advanced age Hx stroke/TIA, details unclear Coronary artery disease 2019  Other Active Problems SSS status post pacemaker  Hospital day # 6  Neurology will sign off. Please call with questions. Pt will follow up with Dr. Marjory Lies at [facility] in about 4 weeks. Thanks for the consult.   Marvel Plan, MD PhD Stroke Neurology 03/28/2023 8:40  PM    To contact Stroke Continuity provider, please refer to [email or web address] After hours, contact General Neurology

## 2023-03-28 NOTE — Progress Notes (Signed)
Speech Language Pathology Treatment: Dysphagia  Patient Details Name: Melissa Lopez MRN: [id number] DOB: [birth year] Today's Date: 03/28/2023 Time: 4098-1191 SLP Time Calculation (min) (ACUTE ONLY): 14 min  Assessment / Plan / Recommendation Clinical Impression  Pt states that she has had no difficulty swallowing current diet, although daughter endorses vomiting yesterday (which she believes is due to gastroparesis). Due to vomiting, daughter states that she has been ordering softer meals, which they report are also easier for pt to chew. During trials of thin liquids, purees, and solids, pt had no overt s/s of dysphagia or aspiration. She states that with her daughter's help, she is choosing foods from the menu that reflect her baseline diet (softer foods/soups). SLP provided education on taking small bites and ensuring foods are able to be fully masticated. Current diet recommendation of regular textures and thin liquids remains appropriate. No additional SLP f/u is necessary.    HPI HPI: Pt is a 72 y.o. female who presented hematuria. Pt found to have AKI. On 4/16 left-sided weakness was noted by PT and code stroke was called. CT head 4/16: Small acute right thalamic hemorrhage, volume less than 1 mL. Mild surrounding edema but no significant mass effect or other complicating features. PMH: dementia CKD stage III, coronary artery disease, sick sinus syndrome status post permanent pacemaker, primary hyperparathyroidism, diabetes mellitus with gastroparesis, hypertension, Parkinson disease.      SLP Plan  All goals met      Recommendations for follow up therapy are one component of a multi-disciplinary discharge planning process, led by the attending physician.  Recommendations may be updated based on patient status, additional functional criteria and insurance authorization.    Recommendations  Diet recommendations: Regular;Thin liquid Liquids provided via: Cup;Straw Medication  Administration: Whole meds with liquid Supervision: Staff to assist with self feeding;Intermittent supervision to cue for compensatory strategies Compensations: Slow rate;Small sips/bites Postural Changes and/or Swallow Maneuvers: Seated upright 90 degrees;Upright 30-60 min after meal                  Oral care BID   Frequent or constant Supervision/Assistance Dysphagia, unspecified (R13.10)     All goals met    Brunilda Payor., Student SLP  03/28/2023, 2:22 PM

## 2023-03-28 NOTE — Progress Notes (Signed)
Physical Therapy Treatment Patient Details Name: Melissa Lopez MRN: [id number] DOB: 07-11-1951 Today's Date: 03/28/2023   History of Present Illness Melissa Lopez is a 72 y.o. female who presents to ED at [facility] secondary to hematuriawith recent history of multiple falls, secondary to her Parkinson disease, she does report left flank pain, hematuria.  Increased left weakness reported with therapy on 4/16 with positive CT scan noted for right acute thalamic hemorrage.  She was then transferred to [facility] for closer monitoring.  PMH of CKD stage III, coronary artery disease, sick sinus syndrome status post permanent pacemaker, primary hyperparathyroidism, diabetes mellitus with gastroparesis, hypertension, and history of Parkinson disease.    PT Comments    Pt is motivated to participate and get stronger, even though she is reporting fear of falling. She demonstrates deficits in L-sided strength, balance, and muscular power. She requires mod-maxA for bed mobility and maxA for transfers and to take steps along EOB with UE support at this time. She is at high risk for falls and could greatly benefit from intensive therapy to reduce her risk for falls, reduce caregiver burden, and promote independence and improved quality of life. Performed several exercises to improve L-sided strength during session. Will continue to follow acutely.     Recommendations for follow up therapy are one component of a multi-disciplinary discharge planning process, led by the attending physician.  Recommendations may be updated based on patient status, additional functional criteria and insurance authorization.  Follow Up Recommendations  Can patient physically be transported by private vehicle: No    Assistance Recommended at Discharge Frequent or constant Supervision/Assistance  Patient can return home with the following A lot of help with bathing/dressing/bathroom;Help with stairs or ramp for entrance;Assistance  with cooking/housework;Two people to help with walking and/or transfers;Assist for transportation;Direct supervision/assist for medications management;Direct supervision/assist for financial management   Equipment Recommendations  None recommended by PT    Recommendations for Other Services       Precautions / Restrictions Precautions Precautions: Fall Precaution Comments: Hx of Parkinson's; painful L hand Restrictions Weight Bearing Restrictions: No     Mobility  Bed Mobility Overal bed mobility: Needs Assistance Bed Mobility: Supine to Sit, Sit to Supine     Supine to sit: Max assist, HOB elevated Sit to supine: Mod assist, HOB elevated   General bed mobility comments: Pt initiating bringing legs towards L EOB, but needed minA to complete movement off bed but maxA to pivot hips using bed pad and to assist in ascending trunk to sit up, cuing pt to push up through L elbow > hand. ModA to bring L leg onto bed and control trunk, cuing pt to lean onto L elbow, to descend trunk    Transfers Overall transfer level: Needs assistance Equipment used: Rolling walker (2 wheels), 1 person hand held assist Transfers: Sit to/from Stand Sit to Stand: Max assist           General transfer comment: MaxA using bed pad as a sling to power pt up to stand, 1x to RW but pt maintained a very flexed posture with head basically at RW, 1x to PT with anterior approach and knee block provided with improve upright posture noted.    Ambulation/Gait Ambulation/Gait assistance: Max assist Gait Distance (Feet): 2 Feet Assistive device: 1 person hand held assist Gait Pattern/deviations: Decreased step length - right, Decreased step length - left, Decreased stride length, Decreased weight shift to right, Decreased weight shift to left Gait velocity: slow Gait velocity interpretation: <  1.31 ft/sec, indicative of household ambulator   General Gait Details: Anterior approach with knee block provided and  pt supporting self on therapist. Bed pad used to facilitate hip extension. MaxA and cues to shift weight laterally for pt to take step to L along EOB towards HOB.   Stairs             Wheelchair Mobility    Modified Rankin (Stroke Patients Only) Modified Rankin (Stroke Patients Only) Pre-Morbid Rankin Score: Moderately severe disability Modified Rankin: Severe disability     Balance Overall balance assessment: Needs assistance Sitting-balance support: No upper extremity supported, Feet supported Sitting balance-Leahy Scale: Fair Sitting balance - Comments: ModA initially but progressed to min guard for static sitting balance EOB   Standing balance support: Bilateral upper extremity supported, During functional activity Standing balance-Leahy Scale: Poor Standing balance comment: Mod-maxA for static standing balance, maxA to take steps with UE support                            Cognition Arousal/Alertness: Awake/alert Behavior During Therapy: Flat affect Overall Cognitive Status: History of cognitive impairments - at baseline                                 General Comments: Follows simple commands with increased time. oriented to self, place and situation, used calendar to identify date.        Exercises General Exercises - Lower Extremity Long Arc Quad: AROM, Strengthening, Left, 10 reps, Seated Other Exercises Other Exercises: Propped onto L elbow and cued to push up to midline, sitting EOB, 5x, to increase L UE strength    General Comments        Pertinent Vitals/Pain Pain Assessment Pain Assessment: Faces Faces Pain Scale: Hurts little more Pain Location: L hand Pain Descriptors / Indicators: Discomfort Pain Intervention(s): Limited activity within patient's tolerance, Monitored during session, Repositioned    Home Living                          Prior Function            PT Goals (current goals can now be found  in the care plan section) Acute Rehab PT Goals Patient Stated Goal: to improve her strength PT Goal Formulation: With patient Time For Goal Achievement: 04/09/23 Potential to Achieve Goals: Fair Progress towards PT goals: Progressing toward goals    Frequency    Min 4X/week      PT Plan Current plan remains appropriate    Co-evaluation              AM-PAC PT "6 Clicks" Mobility   Outcome Measure  Help needed turning from your back to your side while in a flat bed without using bedrails?: A Lot Help needed moving from lying on your back to sitting on the side of a flat bed without using bedrails?: A Lot Help needed moving to and from a bed to a chair (including a wheelchair)?: Total Help needed standing up from a chair using your arms (e.g., wheelchair or bedside chair)?: A Lot Help needed to walk in hospital room?: Total Help needed climbing 3-5 steps with a railing? : Total 6 Click Score: 9    End of Session Equipment Utilized During Treatment: Gait belt Activity Tolerance: Patient tolerated treatment well Patient left: in bed;with call bell/phone  within reach;with bed alarm set   PT Visit Diagnosis: Other abnormalities of gait and mobility (R26.89);Muscle weakness (generalized) (M62.81);History of falling (Z91.81);Pain;Unsteadiness on feet (R26.81);Difficulty in walking, not elsewhere classified (R26.2) Pain - Right/Left: Left Pain - part of body: Hand     Time: 4098-1191 PT Time Calculation (min) (ACUTE ONLY): 18 min  Charges:  $Therapeutic Activity: 8-22 mins                     Raymond Gurney, PT, DPT Acute Rehabilitation Services  Office: [phone]    Jewel Baize 03/28/2023, 5:35 PM

## 2023-03-28 NOTE — Progress Notes (Signed)
PROGRESS NOTE  Melissa Lopez  DOB: [birth year]  PCP: [facility] ZOX:[id number]  DOA: 03/22/2023  LOS: 6 days  Hospital Day: 7  Brief narrative: Melissa Lopez is a 72 y.o. female with PMH significant for DM2, HTN, HLD, CAD, SSS s/p PPM, CKD, PE 2019 on Eliquis, Parkinson's disease, frequent falls. 4/13, patient presented to ED at [facility] with hematuria and frequent falls. Workup showed creatinine elevated to 3.04 Renal CT showed Lt hydronephrosis and blood clot in renal pelvis.   Eliquis was held. She was started on antibiotics, and seen by nephrology, urology.   4/15, while at [facility] she developed Lt arm weakness.  Code stroke called and CT head showed acute Rt thalamic hemorrhage.  She had elevated blood pressure.  She was started on cleviprex and transferred to [facility] for further management with neurology.   PCCM assumed medical care in the ICU. Nephrology and neurology were consulted. 4/19, transferred out to [facility].    Subjective: Patient was seen and examined this morning.  Patient elderly looking female.  Sitting up in recliner.  Awake, alert, slow to respond but able to participate in conversation.  Daughter at bedside. Chart reviewed In the last 24 hours, afebrile, heart rate in 60s, blood pressure in 130s and 140s, breathing on room air.   It seems Cleviprex drip was stopped yesterday evening. 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 While in ICU, blood pressure was controlled with Cleviprex drip.  Subsequent imagings showed stable hemorrhage. PTA, patient was on Eliquis 2.5 mg twice daily and aspirin 81 mg daily.  Currently both on hold. Continue Crestor  Acute metabolic encephalopathy H/o Parkinson's disease,  depression 4/18 patient was noted to generalized lethargy and was also noted to have increased left arm weakness.  Stat CT scan showed unchanged hemorrhage and vasogenic edema Currently patient is on Sinemet 3 times daily, Prozac 40 mg daily, Neurontin 200 mg nightly, Remeron 7.5 mg nightly, Flexeril 5 mg nightly,    Hypertensive emergency History of chronic diastolic CHF SSS s/p PM Required Cleviprex drip in ICU.  Stopped his 4/18 Currently on Coreg 12.5 mg twice daily, Norvasc 10 mg daily, hydralazine 50 mg 3 times daily Blood pressure remains controlled under 150 systolic. Pacemaker interrogation planned.  CAD, HLD  Continue Crestor.  Aspirin and Eliquis on hold  H/o PE -2019 Patient's daughter is not sure if she really had PE in the past.  She is not sure why she is on anticoagulation but states that in the past she was on Coumadin for several years since 77s before switching to Eliquis. With intracranial hemorrhage, Eliquis is currently on hold.  CKD 3B Baseline creatinine less than 2.5.  Creatinine worsened to peak at 3.91.  Steadily improving. Continue to monitor Recent Labs    03/22/23 1911 03/23/23 0420 03/24/23 0619 03/25/23 0446 03/26/23 0228 03/27/23 0435 03/28/23 0345  BUN 31* 34* 36* 37* 36* 35* 35*  CREATININE 3.04* 3.45* 3.91* 3.55* 2.95* 2.68* 2.41*  CO2 25 22 21* 19* 20* 20* 22   Polycystic kidney disease  L hydronephrosis with hematuria Left pyelonephritis Renal CT on admission 4/13 showed left hydronephrosis and clot in the renal pelvis likely due to hemorrhage in the cyst. Seen by urologist Dr. Ronne Binning.  Last note 4/16.  Hematuria resolved.  Left hydronephrosis as symptomatic.  Needs follow-up ultrasound in 2 to 3 weeks. Completed 6-day course of IV Rocephin  Type 2 diabetes mellitus Diabetic gastroparesis, nephropathy, peripheral neuropathy A1c 5.2 on 03/22/2023 Currently sliding-scale insulin with Accu-Cheks Continue Reglan for  gastroparesis Recent Labs  Lab 03/27/23 1134 03/27/23 1523 03/27/23 2142 03/28/23 0804 03/28/23 1202  GLUCAP 140* 245* 111* 113* 142*   Lt wrist pain from pseudogout. prn analgesics   Secondary hyperparathyroidism continue sensipar   Chronic anemia Hemoglobin stable over 10.  Continue to monitor.  Transfuse if less than 7. Recent Labs    03/24/23 0619 03/25/23 0446 03/26/23 0228 03/27/23 0435 03/28/23 0345  HGB 10.5* 11.1* 10.8* 10.4* 10.4*  MCV 93.0 92.6 89.5 88.5 90.4   Impaired mobility Seen by PT.  CIR recommended.  Goals of care   Code Status: Full Code     DVT prophylaxis:  SCDs Start: 03/25/23 1816   Antimicrobials: Completed the course of IV Rocephin Fluid: Currently none Consultants: Neurology, Family Communication: Daughter at bedside.  Status: Inpatient Level of care:  Progressive   Needs to continue in-hospital care:  Pending CIR  Patient from: Home Anticipated d/c to: CIR likely   Diet:  Diet Order             Diet heart healthy/carb modified Room service appropriate? Yes with Assist; Fluid consistency: Thin  Diet effective now                   Scheduled Meds:  amLODipine  10 mg Oral Daily   carbidopa-levodopa  1 tablet Oral TID AC   carvedilol  12.5 mg Oral BID WC   Chlorhexidine Gluconate Cloth  6 each Topical Daily   cinacalcet  45 mg Oral q AM   cyclobenzaprine  5 mg Oral QHS   FLUoxetine  40 mg Oral QHS   gabapentin  200 mg Oral QHS   hydrALAZINE  50 mg Oral TID   insulin aspart  0-5 Units Subcutaneous QHS   insulin aspart  0-9 Units Subcutaneous TID WC   metoCLOPramide  5 mg Oral TID AC   mirtazapine  7.5 mg Oral QHS   polyethylene glycol  17 g Oral Daily   rosuvastatin  20 mg Oral Daily   senna-docusate  1 tablet Oral QHS    PRN meds: acetaminophen, bisacodyl, docusate sodium, guaiFENesin-dextromethorphan, hydrALAZINE, polyethylene glycol   Infusions:   cefTRIAXone (ROCEPHIN)  IV 2 g (03/27/23 2200)     Antimicrobials: Anti-infectives (From admission, onward)    Start     Dose/Rate Route Frequency Ordered Stop   03/23/23 2200  cefTRIAXone (ROCEPHIN) 2 g in sodium chloride 0.9 % 100 mL IVPB        2 g 200 mL/hr over 30 Minutes Intravenous Every 24 hours 03/23/23 1205 03/28/23 2359   03/22/23 2200  cefTRIAXone (ROCEPHIN) 1 g in sodium chloride 0.9 % 100 mL IVPB  Status:  Discontinued        1 g 200 mL/hr over 30 Minutes Intravenous Every 24 hours 03/22/23 2127 03/23/23 1205       Nutritional status:  Body mass index is 29.95 kg/m.          Objective: Vitals:   03/28/23 0800 03/28/23 0900  BP: (!) 138/94 (!) 169/85  Pulse: 68 67  Resp: 19 13  Temp:    SpO2: 95% 96%    Intake/Output Summary (Last 24 hours) at 03/28/2023 1259 Last data filed at 03/28/2023 0900 Gross  per 24 hour  Intake 100 ml  Output 975 ml  Net -875 ml   Filed Weights   03/22/23 2300 03/25/23 1127 03/26/23 0204  Weight: 75.5 kg 71.9 kg 76.7 kg   Weight change:  Body mass index is 29.95 kg/m.   Physical Exam: General exam: Pleasant, elderly Caucasian female.  Not in pain Skin: No rashes, lesions or ulcers. HEENT: Atraumatic, normocephalic, no obvious bleeding Lungs: Clear to auscultation bilaterally CVS: Regular rate and rhythm, no murmur GI/Abd soft, nontender, nondistended, bowel present CNS: Alert, awake, slow to respond but able to answer simple questions Psychiatry: Sad affect Extremities: No pedal edema, no calf tenderness  Data Review: I have personally reviewed the laboratory data and studies available.  F/u labs ordered Unresulted Labs (From admission, onward)     Start     Ordered   03/29/23 0500  Basic metabolic panel  Tomorrow morning,   R       Question:  Specimen collection method  Answer:  Lab=Lab collect   03/28/23 1259   03/29/23 0500  CBC with Differential/Platelet  Tomorrow morning,   R       Question:  Specimen collection method  Answer:  Lab=Lab collect    03/28/23 1259            Total time spent in review of labs and imaging, patient evaluation, formulation of plan, documentation and communication with family: 55 minutes  Signed, Lorin Glass, MD [facility] 03/28/2023

## 2023-03-28 NOTE — Progress Notes (Signed)
Occupational Therapy Treatment Patient Details Name: Chanda Laperle MRN: [id number] DOB: [birth year] Today's Date: 03/28/2023   History of present illness Jahnavi Muratore is a 72 y.o. female who presents to ED at [facility] secondary to hematuriawith recent history of multiple falls, secondary to her Parkinson disease, she does report left flank pain, hematuria.  Increased left weakness reported with therapy on 4/16 with positive CT scan noted for right acute thalamic hemorrage.  She was then transferred to [facility] for closer monitoring.  PMH of CKD stage III, coronary artery disease, sick sinus syndrome status post permanent pacemaker, primary hyperparathyroidism, diabetes mellitus with gastroparesis, hypertension, and history of Parkinson disease.   OT comments  Pt currently max assist for simulated LB selfcare and toileting sit to stand with mod assist for standing with use of the RW for support.  Still with some increased pain and swelling in the right hand but improving.  More active movement noted then previous session as well but still needs max assist to integrate in task functionally.  Feel she will continue to benefit from acute care OT at this time to help progress balance, LUE activation and use, and transfer ability.  Patient will benefit from intensive inpatient follow up therapy, >3 hours/day    Recommendations for follow up therapy are one component of a multi-disciplinary discharge planning process, led by the attending physician.  Recommendations may be updated based on patient status, additional functional criteria and insurance authorization.    Assistance Recommended at Discharge Frequent or constant Supervision/Assistance  Patient can return home with the following  A lot of help with bathing/dressing/bathroom;Assistance with cooking/housework;Assist for transportation;Help with stairs or ramp for entrance;A lot of help with walking and/or transfers;Assistance with feeding;Direct  supervision/assist for financial management   Equipment Recommendations  None recommended by OT (TBD next venue of care)       Precautions / Restrictions Precautions Precautions: Fall Precaution Comments: Hx of Parkinson's; painful L hand Restrictions Weight Bearing Restrictions: No       Mobility Bed Mobility Overal bed mobility: Needs Assistance Bed Mobility: Supine to Sit       Sit to supine: Mod assist   General bed mobility comments: Mod assist for bringing trunk up to sitting with max assist for scooting out to the EOB.    Transfers Overall transfer level: Needs assistance Equipment used: Rolling walker (2 wheels) Transfers: Sit to/from Stand, Bed to chair/wheelchair/BSC Sit to Stand: Mod assist, From elevated surface     Step pivot transfers: Max assist     General transfer comment: Increased posterior bias in standing with flexed trunk and decreased hip extension.  Encouraged upright cervical extension to neutral.  Increased assist needed for weight shifting to advance the LEs with short step length.     Balance Overall balance assessment: Needs assistance Sitting-balance support: Feet unsupported Sitting balance-Leahy Scale: Fair     Standing balance support: During functional activity Standing balance-Leahy Scale: Poor Standing balance comment: Needs therapist assist and use of the RW for support.                           ADL either performed or assessed with clinical judgement   ADL Overall ADL's : Needs assistance/impaired Eating/Feeding: Moderate assistance;Sitting Eating/Feeding Details (indicate cue type and reason): to drink from cup with straw using the RUE                     Toilet Transfer:  Maximal assistance;Stand-pivot;Rolling walker (2 wheels)   Toileting- Clothing Manipulation and Hygiene: Maximal assistance;Sit to/from stand Toileting - Clothing Manipulation Details (indicate cue type and reason): simulated EOB      Functional mobility during ADLs: Maximal assistance;Rolling walker (2 wheels) (stand pivot) General ADL Comments: Pt with better initiation today compared to last visit with this OT.  Still with increased wrist/hand swelling on the left but not as painful.  Sit to stand with mod assist from EOB but decreased ability to extend hips and trunk as well as shift weight to forefoot.  Max assist for advancing RW and stepping to the chair with increased time with increased posterior lean noted.  Provided education to pt's daughter on retrograde massage for the hand and AAROM exercises for the digits, wrist, elbow, and shoulder.  Pt completed 1 set of 10 reps for each with therapist and then therapist completed retrograde massage to help with decreasing edema.  LUE positioned on pillows at end of session with pt in the bedside recliner with lift pad in place for nursing.      Cognition Arousal/Alertness: Awake/alert Behavior During Therapy: Flat affect Overall Cognitive Status: History of cognitive impairments - at baseline                                 General Comments: Delayed initiation at times but improved from last visit.                   Pertinent Vitals/ Pain       Pain Assessment Pain Assessment: Faces Faces Pain Scale: No hurt Pain Location: L hand Pain Descriptors / Indicators: Discomfort Pain Intervention(s): Limited activity within patient's tolerance, Repositioned         Frequency  Min 2X/week        Progress Toward Goals  OT Goals(current goals can now be found in the care plan section)  Progress towards OT goals: Progressing toward goals  Acute Rehab OT Goals Patient Stated Goal: Pt and hopeful to go to rehab OT Goal Formulation: With patient/family Time For Goal Achievement: 04/08/23 Potential to Achieve Goals: Good  Plan Discharge plan remains appropriate       AM-PAC OT "6 Clicks" Daily Activity     Outcome Measure   Help from  another person eating meals?: A Lot Help from another person taking care of personal grooming?: A Lot Help from another person toileting, which includes using toliet, bedpan, or urinal?: A Lot Help from another person bathing (including washing, rinsing, drying)?: A Lot Help from another person to put on and taking off regular upper body clothing?: A Lot Help from another person to put on and taking off regular lower body clothing?: A Lot 6 Click Score: 12    End of Session Equipment Utilized During Treatment: Gait belt  OT Visit Diagnosis: Unsteadiness on feet (R26.81);Other abnormalities of gait and mobility (R26.89);Muscle weakness (generalized) (M62.81);History of falling (Z91.81);Other symptoms and signs involving cognitive function;Hemiplegia and hemiparesis Hemiplegia - Right/Left: Left Hemiplegia - dominant/non-dominant: Non-Dominant Hemiplegia - caused by: Cerebral infarction   Activity Tolerance Patient tolerated treatment well   Patient Left in chair;with call bell/phone within reach;with chair alarm set   Nurse Communication Need for lift equipment        Time: 1610-9604 OT Time Calculation (min): 48 min  Charges: OT General Charges $OT Visit: 1 Visit OT Treatments $Self Care/Home Management : 38-52 mins  Perrin Maltese,  OTR/L Acute Rehabilitation Services  Office [phone] 03/28/2023  03/28/2023, 1:36 PM

## 2023-03-28 NOTE — Progress Notes (Signed)
eLink Physician-Brief Progress Note Patient Name: Melissa Lopez DOB: Jun 27, 1951 MRN: [id number]   Date of Service  03/28/2023  HPI/Events of Note  Wide complex tachycardia with numerous PVCs    eICU Interventions  Unsustained, No intervention indicated  K>4, Add on Mg, Goal >2     Intervention Category Intermediate Interventions: Arrhythmia - evaluation and management  Oliwia Berzins 03/28/2023, 5:00 AM

## 2023-03-28 NOTE — Progress Notes (Signed)
Inpatient Rehab Admissions Coordinator:   [facility] will not agree for CIR stay at this time.  I let them know that pt/family do not want to pursue SNF at this time, but they feel pt cannot tolerate CIR intensity.  Dtr states she will call as well.  I let Osborne Casco with TOC know.   Estill Dooms, PT, DPT Admissions Coordinator [phone] 03/28/23  12:56 PM

## 2023-03-28 NOTE — Progress Notes (Signed)
Pt received from 4N, alert and oriented, in no pain.  VS stable and charted.  Tele applied and CCMD notified.  Pt has PPM that is reportedly quite old and suspected to be in need of exchange.  Per report and confirmed by daughter Pt has significant difficulty feeding herself.  Daughter also reports that Pt has gastroparesis from DM and was nauseaous last night due to being of Reglan for a few days.  Purewick in place and functioning correctly.  Will request soft call button.

## 2023-03-29 DIAGNOSIS — N179 Acute kidney failure, unspecified: Secondary | ICD-10-CM | POA: Diagnosis not present

## 2023-03-29 LAB — BASIC METABOLIC PANEL
Anion gap: 10 (ref 5–15)
BUN: 37 mg/dL — ABNORMAL HIGH (ref 8–23)
CO2: 22 mmol/L (ref 22–32)
Calcium: 9.4 mg/dL (ref 8.9–10.3)
Chloride: 101 mmol/L (ref 98–111)
Creatinine, Ser: 2.25 mg/dL — ABNORMAL HIGH (ref 0.44–1.00)
GFR, Estimated: 23 mL/min — ABNORMAL LOW (ref >60)
Glucose, Bld: 102 mg/dL — ABNORMAL HIGH (ref 70–99)
Potassium: 3.7 mmol/L (ref 3.5–5.1)
Sodium: 133 mmol/L — ABNORMAL LOW (ref 135–145)

## 2023-03-29 LAB — CBC WITH DIFFERENTIAL/PLATELET
Abs Immature Granulocytes: 0.06 10*3/uL (ref 0.00–0.07)
Basophils Absolute: 0.1 10*3/uL (ref 0.0–0.1)
Basophils Relative: 1 %
Eosinophils Absolute: 0.2 10*3/uL (ref 0.0–0.5)
Eosinophils Relative: 2 %
HCT: 28.6 % — ABNORMAL LOW (ref 36.0–46.0)
Hemoglobin: 9.7 g/dL — ABNORMAL LOW (ref 12.0–15.0)
Immature Granulocytes: 0 %
Lymphocytes Relative: 22 %
Lymphs Abs: 3.2 10*3/uL (ref 0.7–4.0)
MCH: 30.5 pg (ref 26.0–34.0)
MCHC: 33.9 g/dL (ref 30.0–36.0)
MCV: 89.9 fL (ref 80.0–100.0)
Monocytes Absolute: 1.5 10*3/uL — ABNORMAL HIGH (ref 0.1–1.0)
Monocytes Relative: 10 %
Neutro Abs: 9.4 10*3/uL — ABNORMAL HIGH (ref 1.7–7.7)
Neutrophils Relative %: 65 %
Platelets: 272 10*3/uL (ref 150–400)
RBC: 3.18 MIL/uL — ABNORMAL LOW (ref 3.87–5.11)
RDW: 12.5 % (ref 11.5–15.5)
WBC: 14.4 10*3/uL — ABNORMAL HIGH (ref 4.0–10.5)
nRBC: 0 % (ref 0.0–0.2)

## 2023-03-29 LAB — GLUCOSE, CAPILLARY
Glucose-Capillary: 90 mg/dL (ref 70–99)
Glucose-Capillary: 152 mg/dL — ABNORMAL HIGH (ref 70–99)
Glucose-Capillary: 125 mg/dL — ABNORMAL HIGH (ref 70–99)
Glucose-Capillary: 175 mg/dL — ABNORMAL HIGH (ref 70–99)

## 2023-03-29 MED ORDER — LOSARTAN POTASSIUM 25 MG PO TABS
25.0000 mg | ORAL_TABLET | Freq: Every day | ORAL | Status: DC
  Administered 2023-03-29 – 2023-03-31 (×3): 25 mg via ORAL
  Filled 2023-03-29 (×4): qty 1

## 2023-03-29 NOTE — Progress Notes (Signed)
PROGRESS NOTE  Melissa Lopez  DOB: August 04, 1951  PCP: [facility] GNF:[id number]  DOA: 03/22/2023  LOS: 7 days  Hospital Day: 8  Brief narrative: Charlie Char is a 72 y.o. female with PMH significant for DM2, HTN, HLD, CAD, SSS s/p PPM, CKD, PE 2019 on Eliquis, Parkinson's disease, frequent falls. 4/13, patient presented to ED at [facility] with hematuria and frequent falls. Workup showed creatinine elevated to 3.04 Renal CT showed Lt hydronephrosis and blood clot in renal pelvis.   Eliquis was held. She was started on antibiotics, and seen by nephrology, urology.   4/15, while at [facility] she developed Lt arm weakness.  Code stroke called and CT head showed acute Rt thalamic hemorrhage.  She had elevated blood pressure.  She was started on cleviprex and transferred to [facility] for further management with neurology.   PCCM assumed medical care in the ICU. Nephrology and neurology were consulted. 4/19, transferred out to [facility].    Subjective: Patient was seen and examined this morning.  Propped up in bed.  Not in distress.  No new symptoms.  Hemodynamically stable.  Family not at bedside. Labs this morning with creatinine improving.  Assessment and plan: Acute Rt thalamic ICH H/o CVA Severe chronic small vessel disease Patient suffered ICH secondary to hypertension in the setting of Eliquis use. While in ICU, blood pressure was controlled with Cleviprex drip.  Subsequent imagings showed stable hemorrhage. PTA, patient was on Eliquis 2.5 mg twice daily and aspirin 81 mg daily.  Currently both on hold. Continue Crestor  Acute metabolic encephalopathy H/o Parkinson's disease, depression 4/18 patient was noted to generalized lethargy and was also noted to have increased left arm weakness.  Stat CT scan showed unchanged hemorrhage and vasogenic edema Currently patient is on Sinemet 3 times daily, Prozac 40 mg daily, Neurontin 200 mg nightly, Remeron 7.5 mg  nightly, Flexeril 5 mg nightly Slow to respond but remains oriented.   Hypertensive emergency History of chronic diastolic CHF SSS s/p PM Required Cleviprex drip in ICU.  Stopped his 4/18 Currently on Coreg 12.5 mg twice daily, Norvasc 10 mg daily, hydralazine 50 mg 3 times daily Blood pressure remains controlled under 150 systolic. Pacemaker interrogation planned.  CAD, HLD  Continue Crestor.  Aspirin and Eliquis are both on hold  H/o PE -2019 Patient's daughter is not sure if she really had PE in the past.  She is not sure why she is on anticoagulation but states that in the past she was on Coumadin for several years since 15s before switching to Eliquis. With intracranial hemorrhage, Eliquis is currently on hold.  CKD 3B Baseline creatinine less than 2.5.  Creatinine worsened to peak at 3.91.  Steadily improving.  Trend as below, creatinine within baseline now. Continue to monitor Recent Labs    03/22/23 1911 03/23/23 0420 03/24/23 0619 03/25/23 0446 03/26/23 0228 03/27/23 0435 03/28/23 0345 03/29/23 0117  BUN 31* 34* 36* 37* 36* 35* 35* 37*  CREATININE 3.04* 3.45* 3.91* 3.55* 2.95* 2.68* 2.41* 2.25*  CO2 25 22 21* 19* 20* 20* 22 22    Polycystic kidney disease  L hydronephrosis with hematuria Left pyelonephritis Renal CT on admission 4/13 showed left hydronephrosis and clot in the renal pelvis likely due to hemorrhage in the cyst. Seen by urologist Dr. Ronne Binning.  Last note 4/16.  Hematuria resolved.  Left hydronephrosis as symptomatic.  Needs follow-up ultrasound in 2 to 3 weeks. Completed 6-day course of IV Rocephin  Type 2  diabetes mellitus Diabetic gastroparesis, nephropathy, peripheral neuropathy A1c 5.2 on 03/22/2023 Currently sliding-scale insulin with Accu-Cheks Continue Reglan for gastroparesis Recent Labs  Lab 03/28/23 1202 03/28/23 1650 03/28/23 2136 03/29/23 0625 03/29/23 1106  GLUCAP 142* 120* 146* 90 152*    Lt wrist pain from  pseudogout. prn analgesics   Secondary hyperparathyroidism continue sensipar   Chronic anemia Hemoglobin stable over 10.  Continue to monitor.  Transfuse if less than 7. Recent Labs    03/25/23 0446 03/26/23 0228 03/27/23 0435 03/28/23 0345 03/29/23 0117  HGB 11.1* 10.8* 10.4* 10.4* 9.7*  MCV 92.6 89.5 88.5 90.4 89.9    Impaired mobility Seen by PT.  CIR recommended.  Goals of care   Code Status: Full Code     DVT prophylaxis:  SCDs Start: 03/25/23 1816   Antimicrobials: Completed the course of IV Rocephin Fluid: Currently none Consultants: Neurology, Family Communication: Daughter not at bedside today  Status: Inpatient Level of care:  Progressive   Needs to continue in-hospital care:  Pending CIR  Patient from: Home Anticipated d/c to: CIR likely   Diet:  Diet Order             Diet heart healthy/carb modified Room service appropriate? Yes with Assist; Fluid consistency: Thin  Diet effective now                   Scheduled Meds:  amLODipine  10 mg Oral Daily   carbidopa-levodopa  1 tablet Oral TID AC   carvedilol  12.5 mg Oral BID WC   Chlorhexidine Gluconate Cloth  6 each Topical Daily   cinacalcet  45 mg Oral q AM   cyclobenzaprine  5 mg Oral QHS   FLUoxetine  40 mg Oral QHS   gabapentin  200 mg Oral QHS   hydrALAZINE  50 mg Oral TID   insulin aspart  0-5 Units Subcutaneous QHS   insulin aspart  0-9 Units Subcutaneous TID WC   losartan  25 mg Oral Daily   metoCLOPramide  5 mg Oral TID AC   mirtazapine  7.5 mg Oral QHS   polyethylene glycol  17 g Oral Daily   rosuvastatin  20 mg Oral Daily   senna-docusate  1 tablet Oral QHS    PRN meds: acetaminophen, bisacodyl, docusate sodium, guaiFENesin-dextromethorphan, hydrALAZINE, polyethylene glycol   Infusions:     Antimicrobials: Anti-infectives (From admission, onward)    Start     Dose/Rate Route Frequency Ordered Stop   03/23/23 2200  cefTRIAXone (ROCEPHIN) 2 g in sodium  chloride 0.9 % 100 mL IVPB        2 g 200 mL/hr over 30 Minutes Intravenous Every 24 hours 03/23/23 1205 03/28/23 2228   03/22/23 2200  cefTRIAXone (ROCEPHIN) 1 g in sodium chloride 0.9 % 100 mL IVPB  Status:  Discontinued        1 g 200 mL/hr over 30 Minutes Intravenous Every 24 hours 03/22/23 2127 03/23/23 1205       Nutritional status:  Body mass index is 31.05 kg/m.          Objective: Vitals:   03/29/23 0718 03/29/23 1108  BP: (!) 176/85 132/69  Pulse: 66 73  Resp: 20 20  Temp: 98.3 F (36.8 C) 98.3 F (36.8 C)  SpO2: 93% 95%    Intake/Output Summary (Last 24 hours) at 03/29/2023 1445 Last data filed at 03/29/2023 1108 Gross per 24 hour  Intake --  Output 1900 ml  Net -1900 ml  Filed Weights   03/25/23 1127 03/26/23 0204 03/29/23 0529  Weight: 71.9 kg 76.7 kg 79.5 kg   Weight change:  Body mass index is 31.05 kg/m.   Physical Exam: General exam: Pleasant, elderly Caucasian female.  Not in pain.  Not in distress. Skin: No rashes, lesions or ulcers. HEENT: Atraumatic, normocephalic, no obvious bleeding Lungs: Clear to auscultation bilaterally CVS: Regular rate and rhythm, no murmur GI/Abd soft, nontender, nondistended, bowel present CNS: Alert, awake, slow to respond but remains vented.  Has persistent weakness in the left upper extremity. Psychiatry: Sad affect Extremities: No pedal edema, no calf tenderness  Data Review: I have personally reviewed the laboratory data and studies available.  F/u labs ordered Unresulted Labs (From admission, onward)    None       Total time spent in review of labs and imaging, patient evaluation, formulation of plan, documentation and communication with family: 45 minutes  Signed, Lorin Glass, MD [facility] 03/29/2023

## 2023-03-30 DIAGNOSIS — N179 Acute kidney failure, unspecified: Secondary | ICD-10-CM | POA: Diagnosis not present

## 2023-03-30 LAB — GLUCOSE, CAPILLARY
Glucose-Capillary: 92 mg/dL (ref 70–99)
Glucose-Capillary: 167 mg/dL — ABNORMAL HIGH (ref 70–99)
Glucose-Capillary: 143 mg/dL — ABNORMAL HIGH (ref 70–99)
Glucose-Capillary: 122 mg/dL — ABNORMAL HIGH (ref 70–99)

## 2023-03-30 MED ORDER — HYDROCODONE-ACETAMINOPHEN 5-325 MG PO TABS
1.0000 | ORAL_TABLET | ORAL | Status: AC | PRN
  Administered 2023-03-30 (×4): 1 via ORAL
  Filled 2023-03-30 (×4): qty 1

## 2023-03-30 MED ORDER — SENNOSIDES-DOCUSATE SODIUM 8.6-50 MG PO TABS
2.0000 | ORAL_TABLET | Freq: Two times a day (BID) | ORAL | Status: DC
  Administered 2023-03-30 – 2023-04-01 (×5): 2 via ORAL
  Filled 2023-03-30 (×6): qty 2

## 2023-03-30 NOTE — Progress Notes (Signed)
PROGRESS NOTE  Melissa Lopez  DOB: December 05, 1951  PCP: [facility] ZOX:[id number]  DOA: 03/22/2023  LOS: 8 days  Hospital Day: 9  Brief narrative: Melissa Lopez is a 72 y.o. female with PMH significant for DM2, HTN, HLD, CAD, SSS s/p PPM, CKD, PE 2019 on Eliquis, Parkinson's disease, frequent falls. 4/13, patient presented to ED at [facility] with hematuria and frequent falls. Workup showed creatinine elevated to 3.04 Renal CT showed Lt hydronephrosis and blood clot in renal pelvis.   Eliquis was held. She was started on antibiotics, and seen by nephrology, urology.   4/15, while at [facility] she developed Lt arm weakness.  Code stroke called and CT head showed acute Rt thalamic hemorrhage.  She had elevated blood pressure.  She was started on cleviprex and transferred to [facility] for further management with neurology.   PCCM assumed medical care in the ICU. Nephrology and neurology were consulted. 4/19, transferred out to [facility].    Subjective: Patient was seen and examined this morning. Propped up in bed.  Not in distress.  No new symptoms.  Left hand weakness continues. Daughter not at bedside.  Assessment and plan: Acute Rt thalamic ICH H/o CVA Severe chronic small vessel disease Patient suffered ICH secondary to hypertension in the setting of Eliquis use. While in ICU, blood pressure was controlled with Cleviprex drip.  Subsequent imagings showed stable hemorrhage. PTA, patient was on Eliquis 2.5 mg twice daily and aspirin 81 mg daily.  Currently both on hold. Continue Crestor  Acute metabolic encephalopathy H/o Parkinson's disease, depression 4/18 patient was noted to generalized lethargy and was also noted to have increased left arm weakness.  Stat CT scan showed unchanged hemorrhage and vasogenic edema Currently patient is on Sinemet 3 times daily, Prozac 40 mg daily, Neurontin 200 mg nightly, Remeron 7.5 mg nightly, Flexeril 5 mg nightly Slow to  respond but remains oriented.   Hypertensive emergency History of chronic diastolic CHF SSS s/p PM Required Cleviprex drip in ICU.  Stopped his 4/18 Currently on Coreg 12.5 mg twice daily, Norvasc 10 mg daily, hydralazine 50 mg 3 times daily Blood pressure remains controlled under 150 systolic. Pacemaker interrogation planned.  CAD, HLD  Continue Crestor.  Aspirin and Eliquis are both on hold  H/o PE -2019 Patient's daughter is not sure if she really had PE in the past.  She is not sure why she is on anticoagulation but states that in the past she was on Coumadin for several years since 19s before switching to Eliquis. With intracranial hemorrhage, Eliquis is currently on hold.  CKD 3B Baseline creatinine less than 2.5.  Creatinine worsened to peak at 3.91.  Steadily improving.  Trend as below, creatinine within baseline now. Continue to monitor.  Repeat labs tomorrow. Recent Labs    03/22/23 1911 03/23/23 0420 03/24/23 0619 03/25/23 0446 03/26/23 0228 03/27/23 0435 03/28/23 0345 03/29/23 0117  BUN 31* 34* 36* 37* 36* 35* 35* 37*  CREATININE 3.04* 3.45* 3.91* 3.55* 2.95* 2.68* 2.41* 2.25*  CO2 25 22 21* 19* 20* 20* 22 22    Polycystic kidney disease  L hydronephrosis with hematuria Left pyelonephritis Renal CT on admission 4/13 showed left hydronephrosis and clot in the renal pelvis likely due to hemorrhage in the cyst. Seen by urologist Dr. Ronne Binning.  Last note 4/16.  Hematuria resolved.  Left hydronephrosis as symptomatic.  Needs follow-up ultrasound in 2 to 3 weeks. Completed 6-day course of IV Rocephin  Type 2 diabetes mellitus  Diabetic gastroparesis, nephropathy, peripheral neuropathy A1c 5.2 on 03/22/2023 Currently sliding-scale insulin with Accu-Cheks Continue Reglan for gastroparesis Recent Labs  Lab 03/29/23 0625 03/29/23 1106 03/29/23 1606 03/29/23 2100 03/30/23 0555  GLUCAP 90 152* 125* 175* 92    Lt wrist pain from pseudogout. prn analgesics    Secondary hyperparathyroidism continue sensipar   Chronic anemia Hemoglobin stable over 10.  Continue to monitor.  Transfuse if less than 7. Recent Labs    03/25/23 0446 03/26/23 0228 03/27/23 0435 03/28/23 0345 03/29/23 0117  HGB 11.1* 10.8* 10.4* 10.4* 9.7*  MCV 92.6 89.5 88.5 90.4 89.9    Impaired mobility Seen by PT.  CIR recommended.  Goals of care   Code Status: Full Code     DVT prophylaxis:  SCDs Start: 03/25/23 1816   Antimicrobials: Completed the course of IV Rocephin Fluid: Currently none Consultants: Neurology, Family Communication: Daughter not at bedside today  Status: Inpatient Level of care:  Progressive   Needs to continue in-hospital care:  Pending CIR.  Repeat labs tomorrow  Patient from: Home Anticipated d/c to: CIR    Diet:  Diet Order             Diet heart healthy/carb modified Room service appropriate? Yes with Assist; Fluid consistency: Thin  Diet effective now                   Scheduled Meds:  amLODipine  10 mg Oral Daily   carbidopa-levodopa  1 tablet Oral TID AC   carvedilol  12.5 mg Oral BID WC   Chlorhexidine Gluconate Cloth  6 each Topical Daily   cinacalcet  45 mg Oral q AM   cyclobenzaprine  5 mg Oral QHS   FLUoxetine  40 mg Oral QHS   gabapentin  200 mg Oral QHS   hydrALAZINE  50 mg Oral TID   insulin aspart  0-5 Units Subcutaneous QHS   insulin aspart  0-9 Units Subcutaneous TID WC   losartan  25 mg Oral Daily   metoCLOPramide  5 mg Oral TID AC   mirtazapine  7.5 mg Oral QHS   polyethylene glycol  17 g Oral Daily   rosuvastatin  20 mg Oral Daily   senna-docusate  2 tablet Oral BID    PRN meds: acetaminophen, bisacodyl, docusate sodium, guaiFENesin-dextromethorphan, hydrALAZINE, HYDROcodone-acetaminophen, polyethylene glycol   Infusions:     Antimicrobials: Anti-infectives (From admission, onward)    Start     Dose/Rate Route Frequency Ordered Stop   03/23/23 2200  cefTRIAXone (ROCEPHIN) 2 g in  sodium chloride 0.9 % 100 mL IVPB        2 g 200 mL/hr over 30 Minutes Intravenous Every 24 hours 03/23/23 1205 03/28/23 2228   03/22/23 2200  cefTRIAXone (ROCEPHIN) 1 g in sodium chloride 0.9 % 100 mL IVPB  Status:  Discontinued        1 g 200 mL/hr over 30 Minutes Intravenous Every 24 hours 03/22/23 2127 03/23/23 1205       Nutritional status:  Body mass index is 31.16 kg/m.          Objective: Vitals:   03/30/23 0732 03/30/23 0944  BP: (!) 151/76 127/69  Pulse: 60 61  Resp: 16 15  Temp: 97.8 F (36.6 C)   SpO2: 96% 92%    Intake/Output Summary (Last 24 hours) at 03/30/2023 1050 Last data filed at 03/29/2023 1743 Gross per 24 hour  Intake 480 ml  Output 1250 ml  Net -770 ml  Filed Weights   03/26/23 0204 03/29/23 0529 03/30/23 0701  Weight: 76.7 kg 79.5 kg 79.8 kg   Weight change: 0.3 kg Body mass index is 31.16 kg/m.   Physical Exam: General exam: Pleasant, elderly Caucasian female.  Not in pain.  Not in distress.   Skin: No rashes, lesions or ulcers. HEENT: Atraumatic, normocephalic, no obvious bleeding Lungs: Clear to auscultation bilaterally CVS: Regular rate and rhythm, no murmur GI/Abd soft, nontender, nondistended, bowel present CNS: Alert, awake, slow to respond but remains vented.  Has persistent weakness in the left upper extremity. Psychiatry: Sad affect. Parkinson face Extremities: No pedal edema, no calf tenderness  Data Review: I have personally reviewed the laboratory data and studies available.  F/u labs ordered Unresulted Labs (From admission, onward)     Start     Ordered   03/31/23 0500  CBC with Differential/Platelet  Tomorrow morning,   R       Question:  Specimen collection method  Answer:  Lab=Lab collect   03/30/23 0814   03/31/23 0500  Basic metabolic panel  Tomorrow morning,   R       Question:  Specimen collection method  Answer:  Lab=Lab collect   03/30/23 0814            Total time spent in review of labs and  imaging, patient evaluation, formulation of plan, documentation and communication with family: 35 minutes  Signed, Lorin Glass, MD [facility] 03/30/2023

## 2023-03-31 DIAGNOSIS — N179 Acute kidney failure, unspecified: Secondary | ICD-10-CM | POA: Diagnosis not present

## 2023-03-31 LAB — BASIC METABOLIC PANEL
Anion gap: 9 (ref 5–15)
BUN: 42 mg/dL — ABNORMAL HIGH (ref 8–23)
CO2: 22 mmol/L (ref 22–32)
Calcium: 9.5 mg/dL (ref 8.9–10.3)
Chloride: 104 mmol/L (ref 98–111)
Creatinine, Ser: 2.49 mg/dL — ABNORMAL HIGH (ref 0.44–1.00)
GFR, Estimated: 20 mL/min — ABNORMAL LOW (ref >60)
Glucose, Bld: 108 mg/dL — ABNORMAL HIGH (ref 70–99)
Potassium: 4.4 mmol/L (ref 3.5–5.1)
Sodium: 135 mmol/L (ref 135–145)

## 2023-03-31 LAB — CBC WITH DIFFERENTIAL/PLATELET
Abs Immature Granulocytes: 0.1 10*3/uL — ABNORMAL HIGH (ref 0.00–0.07)
Basophils Absolute: 0.1 10*3/uL (ref 0.0–0.1)
Basophils Relative: 1 %
Eosinophils Absolute: 0.5 10*3/uL (ref 0.0–0.5)
Eosinophils Relative: 3 %
HCT: 30.8 % — ABNORMAL LOW (ref 36.0–46.0)
Hemoglobin: 10.2 g/dL — ABNORMAL LOW (ref 12.0–15.0)
Immature Granulocytes: 1 %
Lymphocytes Relative: 20 %
Lymphs Abs: 3.4 10*3/uL (ref 0.7–4.0)
MCH: 30.5 pg (ref 26.0–34.0)
MCHC: 33.1 g/dL (ref 30.0–36.0)
MCV: 92.2 fL (ref 80.0–100.0)
Monocytes Absolute: 1.7 10*3/uL — ABNORMAL HIGH (ref 0.1–1.0)
Monocytes Relative: 10 %
Neutro Abs: 11.7 10*3/uL — ABNORMAL HIGH (ref 1.7–7.7)
Neutrophils Relative %: 65 %
Platelets: 261 10*3/uL (ref 150–400)
RBC: 3.34 MIL/uL — ABNORMAL LOW (ref 3.87–5.11)
RDW: 12.1 % (ref 11.5–15.5)
WBC: 17.5 10*3/uL — ABNORMAL HIGH (ref 4.0–10.5)
nRBC: 0 % (ref 0.0–0.2)

## 2023-03-31 LAB — GLUCOSE, CAPILLARY
Glucose-Capillary: 106 mg/dL — ABNORMAL HIGH (ref 70–99)
Glucose-Capillary: 188 mg/dL — ABNORMAL HIGH (ref 70–99)
Glucose-Capillary: 194 mg/dL — ABNORMAL HIGH (ref 70–99)
Glucose-Capillary: 120 mg/dL — ABNORMAL HIGH (ref 70–99)

## 2023-03-31 MED ORDER — HYDROCODONE-ACETAMINOPHEN 5-325 MG PO TABS
1.0000 | ORAL_TABLET | Freq: Four times a day (QID) | ORAL | Status: DC | PRN
  Administered 2023-03-31 – 2023-04-01 (×2): 1 via ORAL
  Filled 2023-03-31 (×2): qty 1

## 2023-03-31 MED ORDER — STROKE: EARLY STAGES OF RECOVERY BOOK
Freq: Once | Status: AC
  Filled 2023-03-31: qty 1

## 2023-03-31 NOTE — Progress Notes (Signed)
PROGRESS NOTE  Melissa Lopez  DOB: 09-29-1951  PCP: [facility] YQM:[id number]  DOA: 03/22/2023  LOS: 9 days  Hospital Day: 10  Brief narrative: Melissa Lopez is a 72 y.o. female with PMH significant for DM2, HTN, HLD, CAD, SSS s/p PPM, CKD, PE 2019 on Eliquis, Parkinson's disease, frequent falls. 4/13, patient presented to ED at [facility] with hematuria and frequent falls. Workup showed creatinine elevated to 3.04 Renal CT showed Lt hydronephrosis and blood clot in renal pelvis.   Eliquis was held. She was started on antibiotics, and seen by nephrology, urology.   4/15, while at [facility] she developed Lt arm weakness.  Code stroke called and CT head showed acute Rt thalamic hemorrhage.  She had elevated blood pressure.  She was started on cleviprex and transferred to [facility] for further management with neurology.   PCCM assumed medical care in the ICU. Nephrology and neurology were consulted. 4/19, transferred out to [facility].    Subjective: Patient was seen and examined this morning. Propped up in bed.  Not in distress.  No new symptoms.  Daughter at bedside.  Pending SNF. Hemodynamically stable.  Assessment and plan: Acute Rt thalamic ICH H/o CVA Severe chronic small vessel disease Patient suffered ICH secondary to hypertension in the setting of Eliquis use. While in ICU, blood pressure was controlled with Cleviprex drip.  Subsequent imagings showed stable hemorrhage. PTA, patient was on Eliquis 2.5 mg twice daily and aspirin 81 mg daily.  Currently both on hold. Continue Crestor  Acute metabolic encephalopathy H/o Parkinson's disease, depression 4/18 patient was noted to generalized lethargy and was also noted to have increased left arm weakness.  Stat CT scan showed unchanged hemorrhage and vasogenic edema Currently patient is on Sinemet 3 times daily, Prozac 40 mg daily, Neurontin 200 mg nightly, Remeron 7.5 mg nightly, Flexeril 5 mg nightly Slow  to respond but remains oriented.   Hypertensive emergency History of chronic diastolic CHF SSS s/p PM Required Cleviprex drip in ICU.  Stopped his 4/18 Currently on Coreg 12.5 mg twice daily, Norvasc 10 mg daily, hydralazine 50 mg 3 times daily Blood pressure remains controlled under 150 systolic. Pacemaker interrogation planned.  CAD, HLD  Continue Crestor.  Aspirin and Eliquis are both on hold  H/o PE -2019 Patient's daughter is not sure if she really had PE in the past.  She is not sure why she is on anticoagulation but states that in the past she was on Coumadin for several years since 60s before switching to Eliquis. With intracranial hemorrhage, Eliquis is currently on hold.  CKD 3B Baseline creatinine less than 2.5.  Creatinine worsened to peak at 3.91.  Steadily improved.  Noted to rising creatinine in the last 24 hours.  But remains at baseline range.  Continue to monitor. Recent Labs    03/22/23 1911 03/23/23 0420 03/24/23 0619 03/25/23 0446 03/26/23 0228 03/27/23 0435 03/28/23 0345 03/29/23 0117 03/31/23 0112  BUN 31* 34* 36* 37* 36* 35* 35* 37* 42*  CREATININE 3.04* 3.45* 3.91* 3.55* 2.95* 2.68* 2.41* 2.25* 2.49*  CO2 25 22 21* 19* 20* 20* 22 22 22     Polycystic kidney disease  L hydronephrosis with hematuria Left pyelonephritis Renal CT on admission 4/13 showed left hydronephrosis and clot in the renal pelvis likely due to hemorrhage in the cyst. Seen by urologist Dr. Ronne Binning.  Last note 4/16.  Hematuria resolved.  Left hydronephrosis as symptomatic.  Needs follow-up ultrasound in 2 to 3 weeks. Completed  6-day course of IV Rocephin  Type 2 diabetes mellitus Diabetic gastroparesis, nephropathy, peripheral neuropathy A1c 5.2 on 03/22/2023 Currently sliding-scale insulin with Accu-Cheks Continue Reglan for gastroparesis Recent Labs  Lab 03/30/23 1145 03/30/23 1538 03/30/23 2113 03/31/23 0605 03/31/23 1149  GLUCAP 167* 143* 122* 106* 188*    Lt  wrist pain from pseudogout. prn analgesics   Secondary hyperparathyroidism continue sensipar   Chronic anemia Hemoglobin stable over 10.  Continue to monitor.  Transfuse if less than 7. Recent Labs    03/26/23 0228 03/27/23 0435 03/28/23 0345 03/29/23 0117 03/31/23 0112  HGB 10.8* 10.4* 10.4* 9.7* 10.2*  MCV 89.5 88.5 90.4 89.9 92.2    Leukocytosis No evidence of infection currently but WBC count remains slightly elevated.  Not steroids.  Chart reviewed.  She seems to have higher than normal baseline WBC count.  Continue to monitor. Recent Labs  Lab 03/26/23 0228 03/27/23 0435 03/28/23 0345 03/29/23 0117 03/31/23 0112  WBC 18.2* 13.8* 14.5* 14.4* 17.5*    Impaired mobility Seen by PT.  CIR recommended.  Goals of care   Code Status: Full Code     DVT prophylaxis:  SCDs Start: 03/25/23 1816   Antimicrobials: Completed the course of IV Rocephin Fluid: Currently none Consultants: Neurology, Family Communication: Daughter at bedside today  Status: Inpatient Level of care:  Progressive   Needs to continue in-hospital care:  Pending CIR.  Repeat labs tomorrow  Patient from: Home Anticipated d/c to: CIR    Diet:  Diet Order             Diet heart healthy/carb modified Room service appropriate? Yes with Assist; Fluid consistency: Thin  Diet effective now                   Scheduled Meds:  amLODipine  10 mg Oral Daily   carbidopa-levodopa  1 tablet Oral TID AC   carvedilol  12.5 mg Oral BID WC   Chlorhexidine Gluconate Cloth  6 each Topical Daily   cinacalcet  45 mg Oral q AM   cyclobenzaprine  5 mg Oral QHS   FLUoxetine  40 mg Oral QHS   gabapentin  200 mg Oral QHS   hydrALAZINE  50 mg Oral TID   insulin aspart  0-5 Units Subcutaneous QHS   insulin aspart  0-9 Units Subcutaneous TID WC   losartan  25 mg Oral Daily   metoCLOPramide  5 mg Oral TID AC   mirtazapine  7.5 mg Oral QHS   polyethylene glycol  17 g Oral Daily   rosuvastatin  20 mg  Oral Daily   senna-docusate  2 tablet Oral BID    PRN meds: acetaminophen, bisacodyl, docusate sodium, guaiFENesin-dextromethorphan, hydrALAZINE, polyethylene glycol   Infusions:     Antimicrobials: Anti-infectives (From admission, onward)    Start     Dose/Rate Route Frequency Ordered Stop   03/23/23 2200  cefTRIAXone (ROCEPHIN) 2 g in sodium chloride 0.9 % 100 mL IVPB        2 g 200 mL/hr over 30 Minutes Intravenous Every 24 hours 03/23/23 1205 03/28/23 2228   03/22/23 2200  cefTRIAXone (ROCEPHIN) 1 g in sodium chloride 0.9 % 100 mL IVPB  Status:  Discontinued        1 g 200 mL/hr over 30 Minutes Intravenous Every 24 hours 03/22/23 2127 03/23/23 1205       Nutritional status:  Body mass index is 31.24 kg/m.          Objective: Vitals:  03/31/23 0732 03/31/23 1149  BP: (!) 153/73 (!) 143/73  Pulse: 62   Resp: 19 18  Temp: 98.5 F (36.9 C) 98.3 F (36.8 C)  SpO2: 96%     Intake/Output Summary (Last 24 hours) at 03/31/2023 1323 Last data filed at 03/30/2023 1540 Gross per 24 hour  Intake --  Output 500 ml  Net -500 ml    Filed Weights   03/29/23 0529 03/30/23 0701 03/31/23 0710  Weight: 79.5 kg 79.8 kg 80 kg   Weight change:  Body mass index is 31.24 kg/m.   Physical Exam: General exam: Pleasant, elderly Caucasian female.  Not in pain.  Not in distress.   Skin: No rashes, lesions or ulcers. HEENT: Atraumatic, normocephalic, no obvious bleeding Lungs: Clear to auscultation bilaterally CVS: Regular rate and rhythm, no murmur GI/Abd soft, nontender, nondistended, bowel present CNS: Alert, awake, slow to respond but remains vented.  Has persistent but improving weakness in the left upper extremity. Psychiatry: Sad affect. Parkinson face Extremities: No pedal edema, no calf tenderness  Data Review: I have personally reviewed the laboratory data and studies available.  F/u labs ordered Unresulted Labs (From admission, onward)    None        Total time spent in review of labs and imaging, patient evaluation, formulation of plan, documentation and communication with family: 35 minutes  Signed, Lorin Glass, MD [facility] 03/31/2023

## 2023-03-31 NOTE — TOC Progression Note (Signed)
Transition of Care [facility]) - Progression Note    Patient Details  Name: Melissa Lopez MRN: [id number] Date of Birth: [birth year]  Transition of Care [facility]) CM/SW Contact  Eduard Roux, [place] Phone Number: 03/31/2023, 2:23 PM  Clinical Narrative:    12:10 pm- CSW confirmed with the patient's daughter, plan to d/c to  [facility] once medically stable.  2:24 pm- confirmed w/ [facility] An[facility]a -approval for SNF and updated SNF choice is [facility]  Confirmed with [facility] availably- [facility] will complete paperwork and [facility] can admit tomorrow.  TOC will continue to follow and assist with discharge planning.  Antony Blackbird, MSW, LCSW Clinical Social Worker    Expected Discharge Plan: Skilled Nursing Facility Barriers to Discharge: Continued Medical Work up  Expected Discharge Plan and Services       Living arrangements for the past 2 months: Single Family Home                                       Social Determinants of Health (SDOH) Interventions SDOH Screenings   Food Insecurity: No Food Insecurity (03/22/2023)  Housing: Low Risk  (03/22/2023)  Transportation Needs: No Transportation Needs (03/22/2023)  Utilities: Not At Risk (03/22/2023)  Tobacco Use: Medium Risk (03/22/2023)    Readmission Risk Interventions    11/17/2020   12:32 PM  Readmission Risk Prevention Plan  Transportation Screening Complete  PCP or Specialist Appt within 3-5 Days Complete  HRI or Home Care Consult Complete  Social Work Consult for Recovery Care Planning/Counseling Complete  Palliative Care Screening Not Applicable  Medication Review Ocean[facility]) Complete

## 2023-03-31 NOTE — TOC Progression Note (Signed)
Transition of Care [facility]) - Progression Note    Patient Details  Name: Melissa Lopez MRN: [id number] Date of Birth: [birth year]  Transition of Care [facility]) CM/SW Contact  Eduard Roux, [place] Phone Number: 03/31/2023, 12:08 PM  Clinical Narrative:     Sent  message to [facility] to review referral. - waiting on response.    Expected Discharge Plan: Home w Home Health Services Barriers to Discharge: Continued Medical Work up  Expected Discharge Plan and Services       Living arrangements for the past 2 months: Single Family Home                                       Social Determinants of Health (SDOH) Interventions SDOH Screenings   Food Insecurity: No Food Insecurity (03/22/2023)  Housing: Low Risk  (03/22/2023)  Transportation Needs: No Transportation Needs (03/22/2023)  Utilities: Not At Risk (03/22/2023)  Tobacco Use: Medium Risk (03/22/2023)    Readmission Risk Interventions    11/17/2020   12:32 PM  Readmission Risk Prevention Plan  Transportation Screening Complete  PCP or Specialist Appt within 3-5 Days Complete  HRI or Home Care Consult Complete  Social Work Consult for Recovery Care Planning/Counseling Complete  Palliative Care Screening Not Applicable  Medication Review Oceanographer) Complete

## 2023-03-31 NOTE — Progress Notes (Signed)
Physical Therapy Treatment Patient Details Name: Melissa Lopez MRN: [id number] DOB: 1951/04/20 Today's Date: 03/31/2023   History of Present Illness Melissa Lopez is a 72 y.o. female who presents to ED at [facility] secondary to hematuriawith recent history of multiple falls, secondary to her Parkinson disease, she does report left flank pain, hematuria.  Increased left weakness reported with therapy on 4/16 with positive CT scan noted for right acute thalamic hemorrage.  She was then transferred to [facility] for closer monitoring.  PMH of CKD stage III, coronary artery disease, sick sinus syndrome status post permanent pacemaker, primary hyperparathyroidism, diabetes mellitus with gastroparesis, hypertension, and history of Parkinson disease.    PT Comments    Pt received in supine, A&O x ~2 and agreeable to therapy session with encouragement, pt with good effort for functional mobility tasks but limited due to heavy R lean with static sitting EOB and needs multimodal cues for body mechanics and simple motor tasks. Pt needing up to +2 maxA for bed mobility and stand pivot transfer with +2 HHA but able to stand with +2 modA, pt unable to weight shift in stance and anxious/impulsive to sit once standing. Pt would benefit from [facility] lift for safety with +2 assist for OOB transfers as she stands well, but is not yet able to weight shift without totalA this date. Pt continues to benefit from PT services to progress toward functional mobility goals.   Recommendations for follow up therapy are one component of a multi-disciplinary discharge planning process, led by the attending physician.  Recommendations may be updated based on patient status, additional functional criteria and insurance authorization.  Follow Up Recommendations  Can patient physically be transported by private vehicle: No    Assistance Recommended at Discharge Frequent or constant Supervision/Assistance  Patient can return home with  the following A lot of help with bathing/dressing/bathroom;Help with stairs or ramp for entrance;Assistance with cooking/housework;Two people to help with walking and/or transfers;Assist for transportation;Direct supervision/assist for medications management;Direct supervision/assist for financial management   Equipment Recommendations  None recommended by PT (TBD)    Recommendations for Other Services       Precautions / Restrictions Precautions Precautions: Fall Precaution Comments: Hx of Parkinson's; painful L hand Restrictions Weight Bearing Restrictions: No     Mobility  Bed Mobility Overal bed mobility: Needs Assistance Bed Mobility: Supine to Sit, Sit to Supine Rolling: Mod assist   Supine to sit: Max assist, +2 for physical assistance     General bed mobility comments: Pt rolling to her R with cues for sequencing/bed rail use; pt needing maxA to pivot hips using bed pad and to assist in ascending trunk to sit up, cuing pt to push up through R elbow > hand. Pt with heavy R lean upon sitting upright.    Transfers Overall transfer level: Needs assistance Equipment used: Rolling walker (2 wheels), 1 person hand held assist Transfers: Sit to/from Stand Sit to Stand: Mod assist, +2 safety/equipment, From elevated surface Stand pivot transfers: Max assist, +2 physical assistance, From elevated surface         General transfer comment: From elevated bed 1x to +2 HHA with gait belt but pt anxious and impulsive to sit back down. On second attempt ModA using bed pad as a sling to power pt up to stand, +2 maxA to pivot toward chair on her R side. RN notified she may do better with [facility] lift for return transfer safety.    Ambulation/Gait  General Gait Details: Unsafe to attempt this date, pt anxious and unable to lift her LLE or RLE despite each knee blocked and cues for technique x2 attempts.    Modified Rankin (Stroke Patients Only) Modified Rankin  (Stroke Patients Only) Pre-Morbid Rankin Score: Moderately severe disability Modified Rankin: Severe disability     Balance Overall balance assessment: Needs assistance Sitting-balance support: Feet supported, Bilateral upper extremity supported Sitting balance-Leahy Scale: Poor Sitting balance - Comments: Pt heavily leaning toward her R side this date, needing dense cues and minA to maxA trunk support, variable assist but increased R lean with fatigue.   Standing balance support: Bilateral upper extremity supported Standing balance-Leahy Scale: Zero Standing balance comment: +2 max to totalA dynamic standing, pt unable to take steps; +2 maxA static standing at bedside                            Cognition Arousal/Alertness: Awake/alert Behavior During Therapy: Flat affect, Anxious Overall Cognitive Status: History of cognitive impairments - at baseline                                 General Comments: Follows simple commands with increased time. Oriented to self, place but not oriented to situation and asks "why can't I walk?". Pt reports her daughter went downstairs and will be back later, no family present during session but they were here earlier in the day per RN. Pt expressed fear of falls but agreeable to work on OOB mobility.        Exercises General Exercises - Lower Extremity Ankle Circles/Pumps: AROM, Both, 10 reps, Supine Heel Slides: AROM, AAROM, Both, 10 reps, Supine Straight Leg Raises: AAROM, Both, 5 reps, Supine Other Exercises Other Exercises: Leaning toward her anterior and L side x 5 reps, cues for nose past her L knee (pt able to achieve nearly midline posture with this technique but unable to maintain for long) Other Exercises: STS x 2 trials (unable to weight shift in stance with +2 HHA and B knees blocked)    General Comments General comments (skin integrity, edema, etc.): HR 63-67 bpm at rest, SpO2 96% on RA      Pertinent  Vitals/Pain Pain Assessment Pain Assessment: Faces Faces Pain Scale: Hurts a little bit Pain Location: pt does not localize, per chart review L hand was prior complaint but pt able to AROM on LUE today without significant grimacing or c/o. Pain Intervention(s): Monitored during session, Repositioned    Home Living                          Prior Function            PT Goals (current goals can now be found in the care plan section) Acute Rehab PT Goals Patient Stated Goal: to improve her strength PT Goal Formulation: With patient Time For Goal Achievement: 04/09/23 Progress towards PT goals: Progressing toward goals    Frequency    Min 4X/week      PT Plan Current plan remains appropriate    Co-evaluation              AM-PAC PT "6 Clicks" Mobility   Outcome Measure  Help needed turning from your back to your side while in a flat bed without using bedrails?: A Lot Help needed moving from lying on your back to  sitting on the side of a flat bed without using bedrails?: A Lot Help needed moving to and from a bed to a chair (including a wheelchair)?: Total Help needed standing up from a chair using your arms (e.g., wheelchair or bedside chair)?: A Lot Help needed to walk in hospital room?: Total Help needed climbing 3-5 steps with a railing? : Total 6 Click Score: 9    End of Session Equipment Utilized During Treatment: Gait belt Activity Tolerance: Patient tolerated treatment well;Other (comment) (fear of falls) Patient left: in bed;with call bell/phone within reach;with bed alarm set Nurse Communication: Mobility status;Need for lift equipment;Other (comment) (use Stedy for return transfer) PT Visit Diagnosis: Other abnormalities of gait and mobility (R26.89);Muscle weakness (generalized) (M62.81);History of falling (Z91.81);Pain;Unsteadiness on feet (R26.81);Difficulty in walking, not elsewhere classified (R26.2) Pain - Right/Left: Left Pain - part of  body: Hand     Time: 1610-9604 PT Time Calculation (min) (ACUTE ONLY): 23 min  Charges:  $Therapeutic Exercise: 8-22 mins $Therapeutic Activity: 8-22 mins                     Melissa Lopez P., PTA Acute Rehabilitation Services Secure Chat Preferred 9a-5:30pm Office: [phone]    Dorathy Kinsman [facility] 03/31/2023, 5:06 PM

## 2023-03-31 NOTE — Care Management Important Message (Signed)
Important Message  Patient Details  Name: Melissa Lopez MRN: [id number] Date of Birth: 1951/10/20   Medicare Important Message Given:  Yes     Renie Ora 03/31/2023, 10:58 AM

## 2023-03-31 NOTE — Progress Notes (Signed)
  Subjective: NAEON. Patient is non-verbal post stroke. Accompanied by family member.   Objective: Vital signs in last 24 hours: Temp:  [98.3 F (36.8 C)-99 F (37.2 C)] 98.3 F (36.8 C) (04/22 1149) Pulse Rate:  [60-65] 62 (04/22 0732) Resp:  [18-20] 18 (04/22 1149) BP: (113-153)/(64-76) 143/73 (04/22 1149) SpO2:  [93 %-96 %] 96 % (04/22 0732) Weight:  [80 kg] 80 kg (04/22 0710)  Intake/Output from previous day: 04/21 0701 - 04/22 0700 In: 720 [P.O.:720] Out: 2050 [Urine:2050] Intake/Output this shift: No intake/output data recorded.  Physical Exam:  General:alert, cooperative, and appears stated age GI: soft, non tender, normal bowel sounds, no palpable masses, no organomegaly, no inguinal hernia Female genitalia: not done Extremities: extremities normal, atraumatic, no cyanosis or edema GU: Foley in place draining clear yellow urine.   Lab Results: Recent Labs    03/29/23 0117 03/31/23 0112  HGB 9.7* 10.2*  HCT 28.6* 30.8*   BMET Recent Labs    03/29/23 0117 03/31/23 0112  NA 133* 135  K 3.7 4.4  CL 101 104  CO2 22 22  GLUCOSE 102* 108*  BUN 37* 42*  CREATININE 2.25* 2.49*  CALCIUM 9.4 9.5   No results for input(s): "LABPT", "INR" in the last 72 hours. No results for input(s): "LABURIN" in the last 72 hours. Results for orders placed or performed during the hospital encounter of 03/22/23  Urine Culture (for pregnant, neutropenic or urologic patients or patients with an indwelling urinary catheter)     Status: Abnormal   Collection Time: 03/22/23  8:40 PM   Specimen: Urine, Clean Catch  Result Value Ref Range Status   Specimen Description   Final    URINE, CLEAN CATCH Performed at [facility] : [facility], [address], [place], [place] [address]    Special Requests   Final    NONE Performed at [facility], [address] [place], [place] [address]    Culture MULTIPLE SPECIES PRESENT, SUGGEST RECOLLECTION (A)  Final   Report Status 03/25/2023 FINAL  Final   MRSA Next Gen by PCR, Nasal     Status: None   Collection Time: 03/25/23 12:10 PM   Specimen: Nasal Mucosa; Nasal Swab  Result Value Ref Range Status   MRSA by PCR Next Gen NOT DETECTED NOT DETECTED Final    Comment: (NOTE) The GeneXpert MRSA Assay (FDA approved for NASAL specimens only), is one component of a comprehensive MRSA colonization surveillance program. It is not intended to diagnose MRSA infection nor to guide or monitor treatment for MRSA infections. Test performance is not FDA approved in patients less than 34 years old. Performed at [facility], [address], [place], [place] [address]     Studies/Results: No results found.  Assessment/Plan: 72yo with gross hematuria, left hydronephrosis 1. Urine remains clear. Serum creatinine improved. Hgb Stable. 2. Left hydronephrosis: The patient is currently asymptomatic and can be managed conservatively. Please have her followup in 2-3 weeks with the renal US. If patient remains in hospital, please collect here around the beginning of May and contact Urology to review images.  Urology will sign off at this time. Please feel free to contact with and questions or concerns.    LOS: 9 days   Scherrie Bateman Denitra Donaghey 03/31/2023, 3:17 PM

## 2023-03-31 NOTE — Progress Notes (Signed)
Physical Therapy Treatment Patient Details Name: Melissa Lopez MRN: [id number] DOB: 05-24-1951 Today's Date: 03/31/2023   History of Present Illness Melissa Lopez is a 72 y.o. female who presents to ED at [facility] secondary to hematuriawith recent history of multiple falls, secondary to her Parkinson disease, she does report left flank pain, hematuria.  Increased left weakness reported with therapy on 4/16 with positive CT scan noted for right acute thalamic hemorrage.  She was then transferred to [facility] for closer monitoring.  PMH of CKD stage III, coronary artery disease, sick sinus syndrome status post permanent pacemaker, primary hyperparathyroidism, diabetes mellitus with gastroparesis, hypertension, and history of Parkinson disease.    PT Comments    PTA called back in room to assist nursing staff with return transfer from chair>bed, pt is +2 and NT not available at time of session. Pt with good effort and tolerance for transfer training with Stedy lift, pt pulling well to rise from lower chair surface but needs increased +2 mod to maxA lift assist via chair pad to extend hips fully for placement/removal of Stedy flaps. Pt stood x2 reps and performed log roll for return to supine using bed rail and needing trunk and BLE assist. Pt up on bed pan at end of session, RN aware and pt also requesting pain medicine for increased L hand pain. Pt tolerated sitting up in chair ~2 hours this date. Pt continues to benefit from PT services to progress toward functional mobility goals.   Recommendations for follow up therapy are one component of a multi-disciplinary discharge planning process, led by the attending physician.  Recommendations may be updated based on patient status, additional functional criteria and insurance authorization.  Follow Up Recommendations  Can patient physically be transported by private vehicle: No    Assistance Recommended at Discharge Frequent or constant  Supervision/Assistance  Patient can return home with the following A lot of help with bathing/dressing/bathroom;Help with stairs or ramp for entrance;Assistance with cooking/housework;Two people to help with walking and/or transfers;Assist for transportation;Direct supervision/assist for medications management;Direct supervision/assist for financial management   Equipment Recommendations  None recommended by PT (TBD)    Recommendations for Other Services       Precautions / Restrictions Precautions Precautions: Fall Precaution Comments: Hx of Parkinson's; painful L hand Restrictions Weight Bearing Restrictions: No     Mobility  Bed Mobility Overal bed mobility: Needs Assistance Bed Mobility: Sit to Sidelying Rolling: Mod assist   Supine to sit: Max assist, +2 for physical assistance   Sit to sidelying: Max assist, +2 for physical assistance General bed mobility comments: from R EOB, log roll to return to supine safely, multimodal cues. Bed pan placed under pt prior to rolling from sidelying to supine per her request, RN present to assist.    Transfers Overall transfer level: Needs assistance Equipment used: Ambulation equipment used Transfers: Sit to/from Stand, Bed to chair/wheelchair/BSC Sit to Stand: Max assist, +2 physical assistance Stand pivot transfers: Max assist, +2 physical assistance, From elevated surface         General transfer comment: From lower chair height using bed pad as a sling to power pt up to stand, +2 maxA to [place] and totalA for pivot chair>bed via Stedy as pt unable to weight shift in stance. Pt needing +2 mod/maxA for sit>stand from elevated Stedy seat and for lowering assist to EOB. Transfer via Lift Equipment: Stedy  Ambulation/Gait               General Gait Details:  Pt unable to weight shift in stance   Stairs             Wheelchair Mobility    Modified Rankin (Stroke Patients Only) Modified Rankin (Stroke Patients  Only) Pre-Morbid Rankin Score: Moderately severe disability Modified Rankin: Severe disability     Balance Overall balance assessment: Needs assistance Sitting-balance support: Feet supported, Bilateral upper extremity supported Sitting balance-Leahy Scale: Poor Sitting balance - Comments: R lateral lean sitting EOB   Standing balance support: Bilateral upper extremity supported Standing balance-Leahy Scale: Zero Standing balance comment: +2 mod/maxA for static standing, posterior and R lean                            Cognition Arousal/Alertness: Awake/alert Behavior During Therapy: Flat affect, Anxious Overall Cognitive Status: History of cognitive impairments - at baseline                                 General Comments: Follows simple commands with increased time.        Exercises General Exercises - Lower Extremity Ankle Circles/Pumps: AROM, Both, 10 reps, Supine Heel Slides: AROM, AAROM, Both, 10 reps, Supine Straight Leg Raises: AAROM, Both, 5 reps, Supine Other Exercises Other Exercises: Leaning toward her anterior and L side x 5 reps, cues for nose past her L knee (pt able to achieve nearly midline posture with this technique but unable to maintain for long) Other Exercises: STS x 2 trials (unable to weight shift in stance with +2 HHA and B knees blocked)    General Comments General comments (skin integrity, edema, etc.): VSS on RA      Pertinent Vitals/Pain Pain Assessment Pain Assessment: 0-10 Pain Score: 7  Faces Pain Scale: Hurts a little bit Pain Location: L hand Pain Descriptors / Indicators: Discomfort, Grimacing Pain Intervention(s): Limited activity within patient's tolerance, Monitored during session, Repositioned (LUE elevated on pillow)     PT Goals (current goals can now be found in the care plan section) Acute Rehab PT Goals Patient Stated Goal: to improve her strength PT Goal Formulation: With patient Time For Goal  Achievement: 04/09/23 Progress towards PT goals: Progressing toward goals    Frequency    Min 4X/week      PT Plan Current plan remains appropriate       AM-PAC PT "6 Clicks" Mobility   Outcome Measure  Help needed turning from your back to your side while in a flat bed without using bedrails?: A Lot Help needed moving from lying on your back to sitting on the side of a flat bed without using bedrails?: A Lot Help needed moving to and from a bed to a chair (including a wheelchair)?: Total Help needed standing up from a chair using your arms (e.g., wheelchair or bedside chair)?: A Lot Help needed to walk in hospital room?: Total Help needed climbing 3-5 steps with a railing? : Total 6 Click Score: 9    End of Session Equipment Utilized During Treatment: Gait belt Activity Tolerance: Patient tolerated treatment well;Other (comment);Patient limited by fatigue (fear of falls) Patient left: in bed;with call bell/phone within reach;with bed alarm set;Other (comment) (on bed pan, RN aware) Nurse Communication: Mobility status;Need for lift equipment;Other (comment);Patient requests pain meds (use Stedy for OOB; pt on bed pan) PT Visit Diagnosis: Other abnormalities of gait and mobility (R26.89);Muscle weakness (generalized) (M62.81);History of falling (Z91.81);Pain;Unsteadiness on feet (  R26.81);Difficulty in walking, not elsewhere classified (R26.2) Pain - Right/Left: Left Pain - part of body: Hand     Time: 1740-1755 PT Time Calculation (min) (ACUTE ONLY): 15 min  Charges:   $Therapeutic Activity: 8-22 mins                     Symir Mah P., PTA Acute Rehabilitation Services Secure Chat Preferred 9a-5:30pm Office: [phone]    Dorathy Kinsman [facility] 03/31/2023, 6:34 PM

## 2023-04-01 DIAGNOSIS — N179 Acute kidney failure, unspecified: Secondary | ICD-10-CM | POA: Diagnosis not present

## 2023-04-01 LAB — GLUCOSE, CAPILLARY
Glucose-Capillary: 105 mg/dL — ABNORMAL HIGH (ref 70–99)
Glucose-Capillary: 218 mg/dL — ABNORMAL HIGH (ref 70–99)

## 2023-04-01 MED ORDER — MIRTAZAPINE 7.5 MG PO TABS: 7.5000 mg | ORAL_TABLET | Freq: Every day | ORAL | 0 refills | Status: DC

## 2023-04-01 MED ORDER — INSULIN ASPART 100 UNIT/ML IJ SOLN: 0.0000 [IU] | Freq: Three times a day (TID) | INTRAMUSCULAR | 11 refills | Status: DC

## 2023-04-01 MED ORDER — GABAPENTIN 100 MG PO CAPS: 200.0000 mg | ORAL_CAPSULE | Freq: Every day | ORAL | Status: DC

## 2023-04-01 MED ORDER — CARVEDILOL 12.5 MG PO TABS: 12.5000 mg | ORAL_TABLET | Freq: Two times a day (BID) | ORAL | Status: DC

## 2023-04-01 MED ORDER — GUAIFENESIN-DM 100-10 MG/5ML PO SYRP: 5.0000 mL | ORAL_SOLUTION | ORAL | 0 refills | Status: DC | PRN

## 2023-04-01 MED ORDER — INSULIN ASPART 100 UNIT/ML IJ SOLN: 0.0000 [IU] | Freq: Every day | INTRAMUSCULAR | 11 refills | Status: DC

## 2023-04-01 MED ORDER — AMLODIPINE BESYLATE 10 MG PO TABS: 10.0000 mg | ORAL_TABLET | Freq: Every day | ORAL | Status: DC

## 2023-04-01 MED ORDER — HYDROCODONE-ACETAMINOPHEN 5-325 MG PO TABS: 1.0000 | ORAL_TABLET | Freq: Four times a day (QID) | ORAL | 0 refills | Status: AC | PRN

## 2023-04-01 MED ORDER — LOSARTAN POTASSIUM 50 MG PO TABS: 50.0000 mg | ORAL_TABLET | Freq: Every day | ORAL | Status: DC

## 2023-04-01 MED ORDER — LOSARTAN POTASSIUM 50 MG PO TABS
50.0000 mg | ORAL_TABLET | Freq: Every day | ORAL | Status: DC
  Administered 2023-04-01: 50 mg via ORAL

## 2023-04-01 MED ORDER — CINACALCET HCL 30 MG PO TABS: 45.0000 mg | ORAL_TABLET | Freq: Every morning | ORAL | Status: DC

## 2023-04-01 NOTE — [place]2 (Signed)
[place] MEDICAID FL2 LEVEL OF CARE FORM     IDENTIFICATION  Patient Name: Melissa Lopez Birthdate: Jan 25, 1951 Sex: female Admission Date (Current Location): 03/22/2023  [facility] and [place] Number:  [employer] and Address:  [facility], 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/25/2023   Nontraumatic subcortical hemorrhage of right cerebral hemisphere 03/25/2023   Hypertensive emergency 03/25/2023   Hematuria 03/22/2023   Hydronephrosis 03/22/2023   GERD (gastroesophageal reflux disease) 01/01/2022   Constipation    Abdominal pain    Acute renal failure superimposed on stage 3b chronic kidney disease 08/02/2021   Hematemesis 08/01/2021   Orthostasis    Seizure-like activity 06/13/2021   Loss of weight    Primary hypertension    Chest pain of uncertain etiology 11/09/2020   Primary hyperparathyroidism 09/25/2020   Emesis    Hypophosphatemia    Hypokalemia 09/22/2020   Chronic diastolic CHF (congestive heart failure) 09/22/2020   Noninfective gastroenteritis and colitis, unspecified 04/13/2020   Unilateral primary osteoarthritis, right knee 04/13/2020   Arthritis 04/13/2020   Bradycardia 04/13/2020   Cardiomyopathy, hypertrophic 04/13/2020   Class 1 obesity due to excess calories with body mass index (BMI) of 34.0 to 34.9 in adult 04/13/2020   Acute kidney failure, unspecified 04/29/2019   Intractable nausea and vomiting 11/10/2018   Malnutrition of moderate degree 11/09/2018    AKI (acute kidney injury)    PE (pulmonary thromboembolism)    H/O cardiac pacemaker 10/31/2018   Syncope 10/31/2018   ST elevation on EKG without MI 10/31/2018   Chest pain with high risk of acute coronary syndrome 10/31/2018   Diabetes mellitus with diabetic nephropathy 10/31/2018   Type 2 diabetes mellitus with complication, with long-term current use of insulin 10/31/2018   CKD (chronic kidney disease) stage 3, GFR 30-59 ml/min (HCC) - per patient report 10/31/2018   Presence of cardiac pacemaker 10/31/2018   Essential hypertension    Hypercalcemia 07/27/2015    Orientation RESPIRATION BLADDER Height & Weight     Self, Time, Situation, Place  Normal External catheter, Incontinent Weight: 176 lb 5.9 oz (80 kg) Height:  5\' 3"  (160 cm)  BEHAVIORAL SYMPTOMS/MOOD NEUROLOGICAL BOWEL NUTRITION STATUS      Continent Diet (please see discharge sumnmary)  AMBULATORY STATUS COMMUNICATION OF NEEDS Skin   Limited Assist Verbally Normal                       Personal Care Assistance Level of Assistance  Bathing, Feeding, Dressing Bathing Assistance: Limited assistance Feeding assistance: Independent Dressing Assistance: Limited assistance     Functional Limitations Info  Sight, Hearing, Speech Sight Info: Adequate (wears glasses) Hearing Info: Adequate Speech Info: Adequate    SPECIAL CARE FACTORS FREQUENCY  PT (By licensed PT), OT (By licensed OT)     PT Frequency: 5x per week OT Frequency: 5x per week            Contractures Contractures Info: Not present    Additional  Factors Info  Code Status, Allergies Code Status Info: FULL Allergies Info: contrst mediac, oxycodone,acetaminophen,xarelto           Current Medications (04/01/2023):  This is the current hospital active medication list Current Facility-Administered Medications  Medication Dose Route Frequency Provider Last Rate Last Admin   acetaminophen (TYLENOL) tablet 650 mg  650 mg Oral Q4H PRN Lidia Collum, PA-C   650 mg at 03/31/23 1632   amLODipine (NORVASC) tablet 10 mg  10 mg Oral Daily Marvel Plan, MD   10 mg at 04/01/23 0820   bisacodyl (DULCOLAX) suppository 10 mg  10 mg Rectal Daily PRN Marvel Plan, MD       carbidopa-levodopa (SINEMET IR) 25-100 MG per tablet immediate release 1 tablet  1 tablet Oral TID AC Elgergawy, Leana Roe, MD   1 tablet at 04/01/23 1125   carvedilol (COREG) tablet 12.5 mg  12.5 mg Oral BID WC Pia Mau D, PA-C   12.5 mg at 04/01/23 0820   Chlorhexidine Gluconate Cloth 2 % PADS 6 each  6 each Topical Daily Arnetha Courser, MD   6 each at 04/01/23 4540   cinacalcet (SENSIPAR) tablet 45 mg  45 mg Oral q AM Elgergawy, Leana Roe, MD   45 mg at 04/01/23 0820   cyclobenzaprine (FLEXERIL) tablet 5 mg  5 mg Oral QHS Pia Mau D, PA-C   5 mg at 03/31/23 2133   docusate sodium (COLACE) capsule 100 mg  100 mg Oral BID PRN Lidia Collum, PA-C   100 mg at 03/26/23 2101   FLUoxetine (PROZAC) capsule 40 mg  40 mg Oral QHS Coralyn Helling, MD   40 mg at 03/31/23 2133   gabapentin (NEURONTIN) capsule 200 mg  200 mg Oral QHS Pia Mau D, PA-C   200 mg at 03/31/23 2133   guaiFENesin-dextromethorphan (ROBITUSSIN DM) 100-10 MG/5ML syrup 5 mL  5 mL Oral Q4H PRN Arnetha Courser, MD       hydrALAZINE (APRESOLINE) injection 10-20 mg  10-20 mg Intravenous Q6H PRN Oretha Milch, MD   20 mg at 03/28/23 0432   hydrALAZINE (APRESOLINE) tablet 50 mg  50 mg Oral TID Elgergawy, Leana Roe, MD   50 mg at 04/01/23 0820   HYDROcodone-acetaminophen (NORCO/VICODIN) 5-325 MG per tablet 1 tablet  1 tablet Oral Q6H PRN Lorin Glass, MD   1 tablet at 04/01/23 1125   insulin aspart (novoLOG) injection 0-5 Units  0-5 Units Subcutaneous QHS Coralyn Helling, MD   2 Units at 03/29/23 2206   insulin aspart (novoLOG) injection 0-9 Units  0-9 Units Subcutaneous TID WC Coralyn Helling, MD   3 Units at 04/01/23 1125   losartan (COZAAR) tablet 50 mg  50 mg Oral Daily Dahal, Melina Schools, MD   50 mg at 04/01/23 0820    metoCLOPramide (REGLAN) tablet 5 mg  5 mg Oral TID Wayland Denis D, PA-C   5 mg at 04/01/23 1125   mirtazapine (REMERON) tablet 7.5 mg  7.5 mg Oral QHS Pia Mau D, PA-C   7.5 mg at 03/31/23 2133   polyethylene glycol (MIRALAX / GLYCOLAX) packet 17 g  17 g Oral Daily Arnetha Courser, MD   17 g at 04/01/23 9811   polyethylene glycol (MIRALAX / GLYCOLAX) packet 17 g  17 g Oral Daily PRN Lidia Collum, PA-C       rosuvastatin (CRESTOR) tablet 20 mg  20 mg Oral Daily Marvel Plan, MD   20 mg at 04/01/23 0820   senna-docusate (  Senokot-S) tablet 2 tablet  2 tablet Oral BID Darlin Drop, DO   2 tablet at 04/01/23 0820     Discharge Medications: Please see discharge summary for a list of discharge medications.  Relevant Imaging Results:  Relevant Lab Results:   Additional Information SSN [id number]  Eduard Roux, LCSW

## 2023-04-01 NOTE — Discharge Summary (Signed)
Physician Discharge Summary  Melissa Lopez EXB:[id number] DOB: 12-Nov-1951 DOA: 03/22/2023  PCP: [facility], [facility]  Admit date: 03/22/2023 Discharge date: 04/01/2023  Admitted From: Home Discharge disposition: SNF  Brief narrative: Melissa Lopez is a 72 y.o. female with PMH significant for DM2, HTN, HLD, CAD, SSS s/p PPM, CKD, PE 2019 on Eliquis, Parkinson's disease, frequent falls. 4/13, patient presented to ED at [facility] with hematuria and frequent falls. Workup showed creatinine elevated to 3.04 Renal CT showed Lt hydronephrosis and blood clot in renal pelvis.   Eliquis was held. She was started on antibiotics, and seen by nephrology, urology.   4/15, while at [facility] she developed Lt arm weakness.  Code stroke called and CT head showed acute Rt thalamic hemorrhage.  She had elevated blood pressure.  She was started on cleviprex and transferred to [facility] for further management with neurology.   PCCM assumed medical care in the ICU. Nephrology and neurology were consulted. 4/19, transferred out to [facility].    Subjective: Patient was seen and examined this morning.  Propped up in bed not in distress.  Alert, awake, able to answer simple questions. Daughter not at bedside today.  Assessment and plan: Acute Rt thalamic ICH H/o CVA Severe chronic small vessel disease Patient suffered ICH secondary to hypertension in the setting of Eliquis use. While in ICU, blood pressure was controlled with Cleviprex drip.  Subsequent imagings showed stable hemorrhage. PTA, patient was on Eliquis 2.5 mg twice daily and aspirin 81 mg daily.  Currently both on hold.  Will remain on hold at discharge. Continue Crestor  Acute metabolic encephalopathy H/o Parkinson's disease, depression 4/18 patient was noted to generalized lethargy and was also noted to have increased left arm weakness.  Stat CT scan showed unchanged hemorrhage and vasogenic edema Currently patient is on  Sinemet 3 times daily, Prozac 40 mg daily, Neurontin 200 mg nightly, Remeron 7.5 mg nightly, Flexeril 5 mg nightly.  Continue the same postdischarge. Slow to respond but remains oriented.   Hypertensive emergency History of chronic diastolic CHF SSS s/p PM Required Cleviprex drip in ICU.  Stopped his 4/18 Currently on Coreg 12.5 mg twice daily, Norvasc 10 mg daily, losartan 50 mg daily, hydralazine 50 mg 3 times daily Blood pressure remains controlled under 150 systolic. Pacemaker interrogation planned.  CAD, HLD  Continue Crestor.  Aspirin and Eliquis are both on hold  H/o PE -2019 Patient's daughter is not sure if she really had PE in the past.  She is not sure why she is on anticoagulation but states that in the past she was on Coumadin for several years since 78s before switching to Eliquis. With intracranial hemorrhage, Eliquis is currently on hold.  CKD 3B Baseline creatinine less than 2.5.  Creatinine worsened to peak at 3.91.  Steadily improved back to baseline. Recent Labs    03/22/23 1911 03/23/23 0420 03/24/23 0619 03/25/23 0446 03/26/23 0228 03/27/23 0435 03/28/23 0345 03/29/23 0117 03/31/23 0112  BUN 31* 34* 36* 37* 36* 35* 35* 37* 42*  CREATININE 3.04* 3.45* 3.91* 3.55* 2.95* 2.68* 2.41* 2.25* 2.49*  CO2 25 22 21* 19* 20* 20* 22 22 22    Polycystic kidney disease  L hydronephrosis with hematuria Left pyelonephritis Renal CT on admission 4/13 showed left hydronephrosis and clot in the renal pelvis likely due to hemorrhage in the cyst. Seen by urologist Dr. Ronne Binning. Hematuria resolved.  Left hydronephrosis as symptomatic. Last follow-up in 4/22. Needs follow-up ultrasound in 2 to 3  weeks. Completed 6-day course of IV Rocephin  Type 2 diabetes mellitus Diabetic gastroparesis, nephropathy, peripheral neuropathy A1c 5.2 on 03/22/2023 Currently sliding-scale insulin with Accu-Cheks.  Blood sugar level have remained controlled. Continue Reglan for  gastroparesis Recent Labs  Lab 03/31/23 0605 03/31/23 1149 03/31/23 1720 03/31/23 2136 04/01/23 0612  GLUCAP 106* 188* 194* 120* 105*   Lt wrist pain from pseudogout. prn analgesics   Secondary hyperparathyroidism continue sensipar   Chronic anemia Hemoglobin stable over 10.  Continue to monitor.  Transfuse if less than 7. Recent Labs    03/26/23 0228 03/27/23 0435 03/28/23 0345 03/29/23 0117 03/31/23 0112  HGB 10.8* 10.4* 10.4* 9.7* 10.2*  MCV 89.5 88.5 90.4 89.9 92.2   Leukocytosis No evidence of infection currently but WBC count remains slightly elevated.  Not steroids.  Chart reviewed.  She seems to have higher than normal baseline WBC count.  Continue to monitor as an outpatient. Recent Labs  Lab 03/26/23 0228 03/27/23 0435 03/28/23 0345 03/29/23 0117 03/31/23 0112  WBC 18.2* 13.8* 14.5* 14.4* 17.5*   Impaired mobility Seen by PT. SNF recommended.  Goals of care   Code Status: Full Code    Wounds:  -    Discharge Exam:   Vitals:   03/31/23 1950 03/31/23 2315 04/01/23 0400 04/01/23 0806  BP: (!) 140/75 (!) 145/71 (!) 145/94 (!) 153/78  Pulse: 60 63 63   Resp: 20 20 20 15   Temp: 97.9 F (36.6 C) 98.4 F (36.9 C) 98.1 F (36.7 C) 98.9 F (37.2 C)  TempSrc: Oral Oral Oral Oral  SpO2: 97% 95% 95%   Weight:      Height:        Body mass index is 31.24 kg/m.   General exam: Pleasant, elderly Caucasian female.  Not in pain.  Not in distress.   Skin: No rashes, lesions or ulcers. HEENT: Atraumatic, normocephalic, no obvious bleeding Lungs: Clear to auscultation bilaterally CVS: Regular rate and rhythm, no murmur GI/Abd soft, nontender, nondistended, bowel present CNS: Alert, awake, slow to respond but remains vented.  Has persistent but improving weakness in the left upper extremity. Psychiatry: Sad affect. Parkinson face Extremities: No pedal edema, no calf tenderness  Follow ups:    Contact information for follow-up providers      Penumalli, Glenford Bayley, MD. Schedule an appointment as soon as possible for a visit in 1 month(s).   Specialties: Neurology, Radiology Contact information: [address] [place] [address] [phone]         [facility] Follow up.   Contact information: [address] [place] [address] [phone]         Wyline Mood, NP Follow up.   Specialty: Urology Contact information: [address] [place] [address] [place] [address] [phone]         Malen Gauze, MD Follow up.   Specialty: Urology Contact information: [address] [place] [address] [phone]              Contact information for after-discharge care     Destination     [facility] Preferred SNF .   Service: Skilled Nursing Contact information: [address] [place] [address] [phone]                     Discharge Instructions:   Discharge Instructions     Ambulatory referral to Neurology   Complete by:  As directed    Follow up with Dr. Marjory Lies at [facility] in 4-6 weeks. Pt is Dr. Richrd Humbles pt. Thanks.   Call MD for:  difficulty breathing, headache or visual disturbances   Complete by: As directed    Call MD for:  extreme fatigue   Complete by: As directed    Call MD for:  hives   Complete by: As directed    Call MD for:  persistant dizziness or light-headedness   Complete by: As directed    Call MD for:  persistant nausea and vomiting   Complete by: As directed    Call MD for:  severe uncontrolled pain   Complete by: As directed    Call MD for:  temperature >100.4   Complete by: As directed    Diet Carb Modified   Complete by: As directed    Discharge instructions   Complete by: As directed    Discharge instructions for diabetes mellitus: Check blood sugar 3 times a day and bedtime at home. If blood sugar running above 200 or less than 70 please call  your MD to adjust insulin. If you notice signs and symptoms of hypoglycemia (low blood sugar) like jitteriness, confusion, thirst, tremor and sweating, please check blood sugar, drink sugary drink/biscuits/sweets to increase sugar level and call MD or return to ER.    General discharge instructions: Follow with Primary [facility] in 7 days  Please request your PCP  to go over your hospital tests, procedures, radiology results at the follow up. Please get your medicines reviewed and adjusted.  Your PCP may decide to repeat certain labs or tests as needed. Do not drive, operate heavy machinery, perform activities at heights, swimming or participation in water activities or provide baby sitting services if your were admitted for syncope or siezures until you have seen by Primary MD or a Neurologist and advised to do so again. [place] Controlled Substance Reporting System database was reviewed. Do not drive, operate heavy machinery, perform activities at heights, swim, participate in water activities or provide baby-sitting services while on medications for pain, sleep and mood until your outpatient physician has reevaluated you and advised to do so again.  You are strongly recommended to comply with the dose, frequency and duration of prescribed medications. Activity: As tolerated with Full fall precautions use walker/cane & assistance as needed Avoid using any recreational substances like cigarette, tobacco, alcohol, or non-prescribed drug. If you experience worsening of your admission symptoms, develop shortness of breath, life threatening emergency, suicidal or homicidal thoughts you must seek medical attention immediately by calling 911 or calling your MD immediately  if symptoms less severe. You must read complete instructions/literature along with all the possible adverse reactions/side effects for all the medicines you take and that have been prescribed  to you. Take any new medicine only after you have completely understood and accepted all the possible adverse reactions/side effects.  Wear Seat belts while driving. You were cared for by a hospitalist during your hospital stay. If you have any questions about your discharge medications or the care you received while you were in the hospital after you are discharged, you can call the unit and ask to speak with the hospitalist or the covering physician. Once you are discharged, your primary care physician will handle any further medical issues. Please note that NO REFILLS for any discharge medications will be authorized once you are discharged, as it is imperative that you return  to your primary care physician (or establish a relationship with a primary care physician if you do not have one).   Increase activity slowly   Complete by: As directed        Discharge Medications:   Allergies as of 04/01/2023       Reactions   Contrast Media [iodinated Contrast Media] Swelling   Contrast Dye - the one made out of shellfish, gives a really bad reaction - swelling   Oxycodone-acetaminophen Anaphylaxis   Xarelto  [rivaroxaban] Swelling   Oxycodone Other (See Comments)   Statused by Person:  EAST, CAMELIA(CE) on [id number]        Medication List     STOP taking these medications    apixaban 2.5 MG Tabs tablet Commonly known as: ELIQUIS   aspirin 81 MG chewable tablet   traZODone 50 MG tablet Commonly known as: DESYREL       TAKE these medications    (feeding supplement) PROSource Plus liquid Take 30 mLs by mouth daily.   acetaminophen 325 MG tablet Commonly known as: TYLENOL Take 650 mg by mouth in the morning, at noon, and at bedtime.   amLODipine 10 MG tablet Commonly known as: NORVASC Take 1 tablet (10 mg total) by mouth daily. Start taking on: April 02, 2023   BIOFREEZE EX Apply 1 application topically 3 (three) times daily as needed (pain.).   carbidopa-levodopa  25-100 MG tablet Commonly known as: SINEMET IR Take 1 tablet by mouth 3 (three) times daily before meals.   carvedilol 12.5 MG tablet Commonly known as: COREG Take 1 tablet (12.5 mg total) by mouth 2 (two) times daily with a meal. What changed:  medication strength how much to take   cinacalcet 30 MG tablet Commonly known as: SENSIPAR Take 1.5 tablets (45 mg total) by mouth in the morning. Start taking on: April 02, 2023   cyclobenzaprine 5 MG tablet Commonly known as: FLEXERIL Take 5 mg by mouth at bedtime.   FLUoxetine 20 MG tablet Commonly known as: PROZAC Take 40 mg by mouth at bedtime.   gabapentin 100 MG capsule Commonly known as: NEURONTIN Take 2 capsules (200 mg total) by mouth at bedtime. What changed:  medication strength how much to take   guaiFENesin-dextromethorphan 100-10 MG/5ML syrup Commonly known as: ROBITUSSIN DM Take 5 mLs by mouth every 4 (four) hours as needed for cough.   hydrALAZINE 50 MG tablet Commonly known as: APRESOLINE Take 50 mg by mouth 3 (three) times daily.   HYDROcodone-acetaminophen 5-325 MG tablet Commonly known as: NORCO/VICODIN Take 1 tablet by mouth every 6 (six) hours as needed for up to 5 days for moderate pain. What changed:  when to take this reasons to take this   insulin aspart 100 UNIT/ML injection Commonly known as: novoLOG Inject 0-9 Units into the skin 3 (three) times daily with meals.   insulin aspart 100 UNIT/ML injection Commonly known as: novoLOG Inject 0-5 Units into the skin at bedtime.   lidocaine 5 % Commonly known as: LIDODERM Place 2 patches onto the skin daily as needed (pain (hip & back)). (Apply hip and back )Remove & Discard patch within 12 hours or as directed by MD   losartan 50 MG tablet Commonly known as: COZAAR Take 1 tablet (50 mg total) by mouth daily. Start taking on: April 02, 2023   Magnesium 400 MG Caps Take 400 mg by mouth daily.   metoCLOPramide 10 MG tablet Commonly known  as: REGLAN Take 10 mg by mouth  in the morning and at bedtime.   MINERIN CREME EX Apply topically daily.   mirtazapine 7.5 MG tablet Commonly known as: REMERON Take 1 tablet (7.5 mg total) by mouth at bedtime for 5 days.   polyethylene glycol 17 g packet Commonly known as: MIRALAX / GLYCOLAX Take 17 g by mouth 2 (two) times daily.   rosuvastatin 40 MG tablet Commonly known as: CRESTOR Take 40 mg by mouth in the morning.   senna-docusate 8.6-50 MG tablet Commonly known as: Senokot-S Take 2 tablets by mouth 2 (two) times daily.   UNKNOWN TO PATIENT Place 1-2 sprays into the nose daily. For Degenerative Disc Disease         The results of significant diagnostics from this hospitalization (including imaging, microbiology, ancillary and laboratory) are listed below for reference.    Procedures and Diagnostic Studies:   CT Renal Stone Study  Result Date: 03/22/2023 CLINICAL DATA:  Abdominal/flank pain, stone suspected EXAM: CT ABDOMEN AND PELVIS WITHOUT CONTRAST TECHNIQUE: Multidetector CT imaging of the abdomen and pelvis was performed following the standard protocol without IV contrast. RADIATION DOSE REDUCTION: This exam was performed according to the departmental dose-optimization program which includes automated exposure control, adjustment of the mA and/or kV according to patient size and/or use of iterative reconstruction technique. COMPARISON:  CT 08/01/2021 FINDINGS: Lower chest: Trace left pleural thickening. Mild dependent atelectasis. Pacemaker wires partially included. Hepatobiliary: Pneumobilia which is intermittently been present on prior exam. Cholecystectomy. No evidence of focal liver lesion. Pancreas: Parenchymal atrophy. No ductal dilatation or inflammation. Spleen: Normal in size without focal abnormality. Adrenals/Urinary Tract: No adrenal nodule. Again seen polycystic appearance of both kidneys with cysts of varying sizes. One of the cysts in the upper pole of the  left kidney is now hyperdense likely representing interval hemorrhage. There is left hydronephrosis that is new from prior exam. Diffusely increased density within the left renal collecting system which has a slightly rounded appearance in the renal pelvis measuring 2.7 cm, series 2, image 39. This is favored to represent hemorrhage and blood clot. Some hyperdense material is seen within the left ureter. No evidence of ureteral stone or obstruction. There is moderate left perinephric edema, with Peri ureteric fat stranding. No right hydronephrosis. No right renal calculi. Partially distended urinary bladder. There is no bladder stone. Stomach/Bowel: No bowel obstruction or inflammation. The appendix is not definitively seen. Vascular/Lymphatic: Aortic atherosclerosis. No aneurysm. There are small left retroperitoneal lymph nodes, likely reactive. Reproductive: Status post hysterectomy. No adnexal masses. Other: No free air or free fluid. No abdominal wall hernia. Musculoskeletal: Degenerative change of the hips and lumbar spine. There are no acute or suspicious osseous abnormalities. IMPRESSION: 1. Left hydronephrosis with diffuse increased density within the left renal collecting system and ureter, which has a rounded appearance in the renal pelvis. This is favored to represent hemorrhage and blood clot. The possibility of infection is also considered. No evidence of ureteral stone or obstruction. Recommend follow-up after course of treatment to ensure resolution, as renal collecting system mass is not entirely excluded. 2. Polycystic appearance of both kidneys with cysts of varying sizes. One of the cysts in the upper pole of the left kidney is now hyperdense, likely representing interval hemorrhage. Aortic Atherosclerosis (ICD10-I70.0). Electronically Signed   By: Narda Rutherford M.D.   On: 03/22/2023 19:25     Labs:   Basic Metabolic Panel: Recent Labs  Lab 03/26/23 0228 03/27/23 0435 03/28/23 0345  03/29/23 0117 03/31/23 0112  NA 135 135  137 133* 135  K 3.2* 3.6 3.7 3.7 4.4  CL 100 106 105 101 104  CO2 20* 20* GLUCOSE 144* 102* 109* 102* 108*  BUN 36* 35* 35* 37* 42*  CREATININE 2.95* 2.68* 2.41* 2.25* 2.49*  CALCIUM 8.4* 8.6* 9.5 9.4 9.5  MG 1.5* 2.7* 2.2  --   --    GFR Estimated Creatinine Clearance: 20.7 mL/min (A) (by C-G formula based on SCr of 2.49 mg/dL (H)). Liver Function Tests: Recent Labs  Lab 03/27/23 0435  AST 244*  ALT 23  ALKPHOS 127*  BILITOT 0.5  PROT 5.9*  ALBUMIN 2.1*   No results for input(s): "LIPASE", "AMYLASE" in the last 168 hours. No results for input(s): "AMMONIA" in the last 168 hours. Coagulation profile No results for input(s): "INR", "PROTIME" in the last 168 hours.  CBC: Recent Labs  Lab 03/26/23 0228 03/27/23 0435 03/28/23 0345 03/29/23 0117 03/31/23 0112  WBC 18.2* 13.8* 14.5* 14.4* 17.5*  NEUTROABS  --   --   --  9.4* 11.7*  HGB 10.8* 10.4* 10.4* 9.7* 10.2*  HCT 31.5* 30.1* 31.0* 28.6* 30.8*  MCV 89.5 88.5 90.4 89.9 92.2  PLT 214 268 297 272 261   Cardiac Enzymes: No results for input(s): "CKTOTAL", "CKMB", "CKMBINDEX", "TROPONINI" in the last 168 hours. BNP: Invalid input(s): "POCBNP" CBG: Recent Labs  Lab 03/31/23 0605 03/31/23 1149 03/31/23 1720 03/31/23 2136 04/01/23 0612  GLUCAP 106* 188* 194* 120* 105*   D-Dimer No results for input(s): "DDIMER" in the last 72 hours. Hgb A1c No results for input(s): "HGBA1C" in the last 72 hours. Lipid Profile No results for input(s): "CHOL", "HDL", "LDLCALC", "TRIG", "CHOLHDL", "LDLDIRECT" in the last 72 hours. Thyroid function studies No results for input(s): "TSH", "T4TOTAL", "T3FREE", "THYROIDAB" in the last 72 hours.  Invalid input(s): "FREET3" Anemia work up No results for input(s): "VITAMINB12", "FOLATE", "FERRITIN", "TIBC", "IRON", "RETICCTPCT" in the last 72 hours. Microbiology Recent Results (from the past 240 hour(s))  Urine Culture (for  pregnant, neutropenic or urologic patients or patients with an indwelling urinary catheter)     Status: Abnormal   Collection Time: 03/22/23  8:40 PM   Specimen: Urine, Clean Catch  Result Value Ref Range Status   Specimen Description   Final    URINE, CLEAN CATCH Performed at [facility], [address], [place], [place] [address]    Special Requests   Final    NONE Performed at [facility], [address], [place], [place] [address]    Culture MULTIPLE SPECIES PRESENT, SUGGEST RECOLLECTION (A)  Final   Report Status 03/25/2023 FINAL  Final  MRSA Next Gen by PCR, Nasal     Status: None   Collection Time: 03/25/23 12:10 PM   Specimen: Nasal Mucosa; Nasal Swab  Result Value Ref Range Status   MRSA by PCR Next Gen NOT DETECTED NOT DETECTED Final    Comment: (NOTE) The GeneXpert MRSA Assay (FDA approved for NASAL specimens only), is one component of a comprehensive MRSA colonization surveillance program. It is not intended to diagnose MRSA infection nor to guide or monitor treatment for MRSA infections. Test performance is not FDA approved in patients less than 53 years old. Performed at [facility], [address] [place], [place] [address]     Time coordinating discharge: 45 minutes  Signed: Melina Schools Nishita Isaacks  [facility] 04/01/2023, 10:37 AM

## 2023-04-01 NOTE — TOC Progression Note (Signed)
Transition of Care [facility]) - Progression Note    Patient Details  Name: Melissa Lopez MRN: [id number] Date of Birth: 12/13/1950  [facility] CM/SW Contact  Eduard Roux, [place] Phone Number: 04/01/2023, 10:18 AM  Clinical Narrative:     CSW spoke with [facility] - confirmed they can admit patient today- waiting finalizing with [facility]  Contacted [facility] SW Marchelle Folks- she confirmed will complete admission information this morning. They will transport the patient once she is ready for d/c to SNF.   TOC will continue to follow and assist with discharge planning.  Antony Blackbird, MSW, LCSW Clinical Social Worker    Expected Discharge Plan: Skilled Nursing Facility Barriers to Discharge: Continued Medical Work up  Expected Discharge Plan and Services       Living arrangements for the past 2 months: Single Family Home Expected Discharge Date: 04/01/23                                     Social Determinants of Health (SDOH) Interventions SDOH Screenings   Food Insecurity: No Food Insecurity (03/22/2023)  Housing: Low Risk  (03/22/2023)  Transportation Needs: No Transportation Needs (03/22/2023)  Utilities: Not At Risk (03/22/2023)  Tobacco Use: Medium Risk (03/22/2023)    Readmission Risk Interventions    11/17/2020   12:32 PM  Readmission Risk Prevention Plan  Transportation Screening Complete  PCP or Specialist Appt within 3-5 Days Complete  HRI or Home Care Consult Complete  Social Work Consult for Recovery Care Planning/Counseling Complete  Palliative Care Screening Not Applicable  Medication Review Oceanographer) Complete

## 2023-04-01 NOTE — TOC Transition Note (Signed)
Transition of Care [facility]) - CM/SW Discharge Note   Patient Details  Name: Melissa Lopez MRN: [id number] Date of Birth: [birth year]  Transition of Care [facility]) CM/SW Contact:  Eduard Roux, LCSW Phone Number: 04/01/2023, 1:57 PM   Clinical Narrative:     Patient will Discharge to: [facility] Discharge Date: 0423/2024 Family Notified:  Transport By: [facility]  Per MD patient is ready for discharge. RN, patient, and facility notified of discharge. Discharge Summary sent to facility. RN given number for [phone].   Clinical Social Worker signing off.   Final next level of care: Skilled Nursing Facility Barriers to Discharge: Continued Medical Work up   Patient Goals and CMS Choice   Choice offered to / list presented to : Adult Children, Patient  Discharge Placement                         Discharge Plan and Services Additional resources added to the After Visit Summary for                                       Social Determinants of Health (SDOH) Interventions SDOH Screenings   Food Insecurity: No Food Insecurity (03/22/2023)  Housing: Low Risk  (03/22/2023)  Transportation Needs: No Transportation Needs (03/22/2023)  Utilities: Not At Risk (03/22/2023)  Tobacco Use: Medium Risk (03/22/2023)     Readmission Risk Interventions    11/17/2020   12:32 PM  Readmission Risk Prevention Plan  Transportation Screening Complete  PCP or Specialist Appt within 3-5 Days Complete  HRI or Home Care Consult Complete  Social Work Consult for Recovery Care Planning/Counseling Complete  Palliative Care Screening Not Applicable  Medication Review Oceanographer) Complete

## 2023-04-10 ENCOUNTER — Ambulatory Visit (INDEPENDENT_AMBULATORY_CARE_PROVIDER_SITE_OTHER): Payer: Medicare (Managed Care)

## 2023-04-10 DIAGNOSIS — I495 Sick sinus syndrome: Secondary | ICD-10-CM

## 2023-04-14 LAB — CUP PACEART REMOTE DEVICE CHECK
Battery Remaining Longevity: 73 mo
Battery Remaining Percentage: 57 %
Battery Voltage: 2.99 V
Brady Statistic AP VP Percent: 13 %
Brady Statistic AP VS Percent: 47 %
Brady Statistic AS VP Percent: 1 %
Brady Statistic AS VS Percent: 39 %
Brady Statistic RA Percent Paced: 58 %
Brady Statistic RV Percent Paced: 13 %
Date Time Interrogation Session: 20240503210025
Implantable Lead Connection Status: 753985
Implantable Lead Connection Status: 753985
Implantable Lead Implant Date: 20110113
Implantable Lead Implant Date: 20110113
Implantable Lead Location: 753859
Implantable Lead Location: 753860
Implantable Pulse Generator Implant Date: 20191008
Lead Channel Impedance Value: 440 Ohm
Lead Channel Impedance Value: 640 Ohm
Lead Channel Pacing Threshold Amplitude: 0.75 V
Lead Channel Pacing Threshold Amplitude: 0.75 V
Lead Channel Pacing Threshold Pulse Width: 0.4 ms
Lead Channel Pacing Threshold Pulse Width: 0.4 ms
Lead Channel Sensing Intrinsic Amplitude: 12 mV
Lead Channel Sensing Intrinsic Amplitude: 4.4 mV
Lead Channel Setting Pacing Amplitude: 1 V
Lead Channel Setting Pacing Amplitude: 1.75 V
Lead Channel Setting Pacing Pulse Width: 0.4 ms
Lead Channel Setting Sensing Sensitivity: 2 mV
Pulse Gen Model: 2272
Pulse Gen Serial Number: 9064940

## 2023-04-30 NOTE — Progress Notes (Signed)
Remote pacemaker transmission.   

## 2023-05-21 ENCOUNTER — Inpatient Hospital Stay: Payer: Medicare (Managed Care) | Admitting: Diagnostic Neuroimaging

## 2023-06-24 ENCOUNTER — Encounter: Payer: Self-pay | Admitting: Diagnostic Neuroimaging

## 2023-06-24 ENCOUNTER — Ambulatory Visit (INDEPENDENT_AMBULATORY_CARE_PROVIDER_SITE_OTHER): Payer: Medicare (Managed Care) | Admitting: Diagnostic Neuroimaging

## 2023-06-24 VITALS — BP 139/73 | HR 60 | Ht 63.5 in | Wt 181.0 lb

## 2023-06-24 DIAGNOSIS — G20C Parkinsonism, unspecified: Secondary | ICD-10-CM

## 2023-06-24 DIAGNOSIS — I610 Nontraumatic intracerebral hemorrhage in hemisphere, subcortical: Secondary | ICD-10-CM | POA: Diagnosis not present

## 2023-06-24 MED ORDER — CARBIDOPA-LEVODOPA 25-100 MG PO TABS: 1.0000 | ORAL_TABLET | Freq: Three times a day (TID) | ORAL | 4 refills | Status: DC

## 2023-06-24 NOTE — Progress Notes (Signed)
[facility]  PATIENT: Melissa Lopez DOB: 1951-10-12  REFERRING CLINICIAN: Marvel Plan, MD HISTORY FROM: patient and daughter REASON FOR VISIT: follow up   HISTORICAL  CHIEF COMPLAINT:  Chief Complaint  Patient presents with   Follow-up    Pt with daughter, rm 49, she was recently in the hospital for admission of stroke 03/22/23. She is back home. Able to get out of the bed, used bedside commode, feeding herself. Daughter states PT and OT are working with her at [facility] and next week is the last week unless the recommend more. Prior to stroke she could walk with walker small amounts but now standing, pivoting around to bedside commode or chair.     HISTORY OF PRESENT ILLNESS:   UPDATE (06/24/23, VRP): Since last visit, went to ER for AKI, hematuria, then has right thalamic ICH in the hospital. Micah Flesher to inpatient rehab, now back home since middle of May 2024. Some slight improvements.  Also has been on carb/levo since Oct 2023, and some improvements in tremors and gait.   UPDATE (09/23/22, VRP): Since last visit, has had progressive gait and balance decline, in spite of improving p.o. intake and gaining weight.  Patient feels like her feet get stuck to the ground.  Having some tremors.  Daughter is concerned about possibility of Parkinson's disease.  UPDATE (11/27/21, VRP): Since last visit, doing poorly. Admitted in July 2022 for convulsive syncope, orthostatic hypotension, hypercalcemia, AKI, dehydration. Then admitted Aug 2022 for intractable N/V and hematemesis. Now calcium and renal fx are improving. No more shaking spells.   PRIOR HPI (04/16/21):  72 year old female here for evaluation of frequent falls.  History of coronary disease, sick sinus syndrome, pacemaker, diabetes, hypertension, gastroparesis, chronic nausea and vomiting, chronic THC use, chronic pain.  November 2019 patient was living in [place] with daughter and moved to [place] to  be closer to family.  Since that time she has had being more falls.  In December 2019 she had a heart attack.  Symptoms continue to worsen since that time.  Now she is having falls every 2 to 3 months, sometimes several times a week.  She also has had 60 to 80 pound weight loss over the past few years.  She has had continued physical decline.  Recently she fell in the middle the night when she was using her bedside commode, striking her left forehead and eye on the bed rail.      REVIEW OF SYSTEMS: Full 14 system review of systems performed and negative with exception of: As per HPI.  ALLERGIES: Allergies  Allergen Reactions   Contrast Media [Iodinated Contrast Media] Swelling    Contrast Dye - the one made out of shellfish, gives a really bad reaction - swelling   Oxycodone-Acetaminophen Anaphylaxis   Xarelto  [Rivaroxaban] Swelling   Oxycodone Other (See Comments)    Statused by Person:  EAST, CAMELIA(CE) on [phone]    HOME MEDICATIONS: Outpatient Medications Prior to Visit  Medication Sig Dispense Refill   acetaminophen (TYLENOL) 325 MG tablet Take 650 mg by mouth in the morning, at noon, and at bedtime.     amLODipine (NORVASC) 10 MG tablet Take 1 tablet (10 mg total) by mouth daily.     carvedilol (COREG) 12.5 MG tablet Take 1 tablet (12.5 mg total) by mouth 2 (two) times daily with a meal.     cinacalcet (SENSIPAR) 30 MG tablet Take 1.5 tablets (45 mg total) by mouth in the  morning. 60 tablet    cyclobenzaprine (FLEXERIL) 5 MG tablet Take 5 mg by mouth at bedtime.     FLUoxetine (PROZAC) 20 MG tablet Take 40 mg by mouth at bedtime.     gabapentin (NEURONTIN) 100 MG capsule Take 2 capsules (200 mg total) by mouth at bedtime.     hydrALAZINE (APRESOLINE) 50 MG tablet Take 50 mg by mouth 3 (three) times daily.     insulin aspart (NOVOLOG) 100 UNIT/ML injection Inject 0-5 Units into the skin at bedtime. 10 mL 11   lidocaine (LIDODERM) 5 % Place 2 patches onto the skin daily as  needed (pain (hip & back)). (Apply hip and back )Remove & Discard patch within 12 hours or as directed by MD     losartan (COZAAR) 50 MG tablet Take 1 tablet (50 mg total) by mouth daily.     Magnesium 400 MG CAPS Take 400 mg by mouth daily.     Menthol, Topical Analgesic, (BIOFREEZE EX) Apply 1 application topically 3 (three) times daily as needed (pain.).     metoCLOPramide (REGLAN) 10 MG tablet Take 10 mg by mouth in the morning and at bedtime.     polyethylene glycol (MIRALAX / GLYCOLAX) 17 g packet Take 17 g by mouth 2 (two) times daily. (Patient taking differently: Take 17 g by mouth 2 (two) times daily as needed for moderate constipation or mild constipation.) 14 each 0   rosuvastatin (CRESTOR) 40 MG tablet Take 40 mg by mouth in the morning.     senna-docusate (SENOKOT-S) 8.6-50 MG tablet Take 2 tablets by mouth 2 (two) times daily as needed for mild constipation.     Skin Protectants, Misc. (MINERIN CREME EX) Apply topically daily.     UNKNOWN TO PATIENT Place 1-2 sprays into the nose daily. For Degenerative Disc Disease     carbidopa-levodopa (SINEMET IR) 25-100 MG tablet Take 1 tablet by mouth 3 (three) times daily before meals. 90 tablet 6   Nutritional Supplements (,FEEDING SUPPLEMENT, PROSOURCE PLUS) liquid Take 30 mLs by mouth daily. (Patient not taking: Reported on 06/24/2023) 887 mL 0   guaiFENesin-dextromethorphan (ROBITUSSIN DM) 100-10 MG/5ML syrup Take 5 mLs by mouth every 4 (four) hours as needed for cough. 118 mL 0   insulin aspart (NOVOLOG) 100 UNIT/ML injection Inject 0-9 Units into the skin 3 (three) times daily with meals. 10 mL 11   mirtazapine (REMERON) 7.5 MG tablet Take 1 tablet (7.5 mg total) by mouth at bedtime for 5 days. 5 tablet 0   No facility-administered medications prior to visit.    PAST MEDICAL HISTORY: Past Medical History:  Diagnosis Date   CKD (chronic kidney disease) stage 3, GFR 30-59 ml/min (HCC) 10/31/2018   - per patient report   Coronary artery  disease    per patient report - 2 caths with non-obstructive CAD   Essential hypertension    Frequent falls    H/O cardiac pacemaker 10/31/2018   implanted 05/2010   Sick sinus syndrome (HCC)    Type 2 diabetes mellitus with complication, with long-term current use of insulin (HCC) 10/31/2018   with diabetic nephropathy    PAST SURGICAL HISTORY: Past Surgical History:  Procedure Laterality Date   BIOPSY  09/28/2020   Procedure: BIOPSY;  Surgeon: Bernette Redbird, MD;  Location: [facility] ENDOSCOPY;  Service: Endoscopy;;   BIOPSY  08/03/2021   Procedure: BIOPSY;  Surgeon: Corbin Ade, MD;  Location: [facility];  Service: Endoscopy;;   CARDIAC PACEMAKER PLACEMENT  2008  in [place] ([facility]) for sick sinus syndrome   CHOLECYSTECTOMY     CORONARY/GRAFT ACUTE MI REVASCULARIZATION N/A 11/09/2020   Procedure: CORONARY/GRAFT ACUTE MI REVASCULARIZATION;  Surgeon: Marykay Lex, MD;  Location: [facility] INVASIVE CV LAB;  Service: Cardiovascular;  Laterality: N/A;   ESOPHAGOGASTRODUODENOSCOPY N/A 09/28/2020   Procedure: ESOPHAGOGASTRODUODENOSCOPY (EGD);  Surgeon: Bernette Redbird, MD;  Location: [facility];  Service: Endoscopy;  Laterality: N/A;   ESOPHAGOGASTRODUODENOSCOPY (EGD) WITH PROPOFOL N/A 11/10/2018   Procedure: ESOPHAGOGASTRODUODENOSCOPY (EGD) WITH PROPOFOL;  Surgeon: Charlott Rakes, MD;  Location: [facility] END[facility]COPY;  Service: Endoscopy;  Laterality: N/A;   ESOPHAGOGASTRODUODENOSCOPY (EGD) WITH PROPOFOL N/A 08/03/2021   severe reflux esophagitis, bile gastritis, felt to have delayed gastric emptying. Pathology from esophageal biopsies with fibrinopurulent exudate and granulation tissue consistent with ulcer base   LEFT HEART CATH AND CORONARY ANGIOGRAPHY N/A 10/31/2018   Procedure: LEFT HEART CATH AND CORONARY ANGIOGRAPHY;  Surgeon: Marykay Lex, MD;  Location: [facility] INVASIVE CV LAB;  Service: Cardiovascular;  Laterality: N/A;   PACEMAKER GENERATOR CHANGE  09/2018   in [place] ([facility])    REPLACEMENT TOTAL KNEE BILATERAL      FAMILY HISTORY: Family History  Problem Relation Age of Onset   Breast cancer Mother    Bone cancer Mother    Heart attack Father 48   Breast cancer Sister    Other Sister        MVA   Heart disease Brother        stents   Diabetes Brother    Colon cancer Neg Hx    Colon polyps Neg Hx     SOCIAL HISTORY: Social History   Socioeconomic History   Marital status: Single    Spouse name: Not on file   Number of children: 4   Years of education: Not on file   Highest education level: Some college, no degree  Occupational History    Comment: retired  Tobacco Use   Smoking status: Former    Current packs/day: 0.00    Types: Cigarettes    Start date: 12/09/1966    Quit date: 12/09/1969    Years since quitting: 53.5   Smokeless tobacco: Never  Vaping Use   Vaping status: Never Used  Substance and Sexual Activity   Alcohol use: Not Currently   Drug use: Not Currently    Comment: as teenager   Sexual activity: Not on file  Other Topics Concern   Not on file  Social History Narrative   04/16/21 Moved from [place] and lives with daughter, Lawson Fiscal in [place].   Now in [place]   Social Determinants of Health   Financial Resource Strain: Not on file  Food Insecurity: No Food Insecurity (03/22/2023)   Hunger Vital Sign    Worried About Running Out of Food in the Last Year: Never true    Ran Out of Food in the Last Year: Never true  Transportation Needs: No Transportation Needs (03/22/2023)   PRAPARE - Administrator, Civil Service (Medical): No    Lack of Transportation (Non-Medical): No  Physical Activity: Not on file  Stress: Not on file  Social Connections: Not on file  Intimate Partner Violence: Not At Risk (03/22/2023)   Humiliation, Afraid, Rape, and Kick questionnaire    Fear of Current or Ex-Partner: No    Emotionally Abused: No    Physically Abused: No    Sexually Abused: No     PHYSICAL EXAM  GENERAL  EXAM/CONSTITUTIONAL: Vitals:  Vitals:  06/24/23 0953  BP: 139/73  Pulse: 60  Weight: 181 lb (82.1 kg)  Height: 5' 3.5" (1.613 m)    Body mass index is 31.56 kg/m. Wt Readings from Last 3 Encounters:  06/24/23 181 lb (82.1 kg)  03/31/23 176 lb 5.9 oz (80 kg)  01/01/22 140 lb 6.4 oz (63.7 kg)   Patient is in no distress; well developed, nourished and groomed; neck is supple  CARDIOVASCULAR: Examination of carotid arteries is normal; no carotid bruits Regular rate and rhythm, no murmurs Examination of peripheral vascular system by observation and palpation is normal  EYES: Ophthalmoscopic exam of optic discs and posterior segments is normal; no papilledema or hemorrhages No results found.  MUSCULOSKELETAL: Gait, strength, tone, movements noted in Neurologic exam below  NEUROLOGIC: MENTAL STATUS:      No data to display         awake, alert, oriented to person, place and time recent and remote memory intact normal attention and concentration language fluent, comprehension intact, naming intact fund of knowledge appropriate  CRANIAL NERVE:  2nd - no papilledema on fundoscopic exam 2nd, 3rd, 4th, 6th - pupils equal and reactive to light, visual fields full to confrontation, extraocular muscles intact, no nystagmus 5th - facial sensation symmetric 7th - facial strength --> DECR RIGHT LOWER FACIAL STRENGTH; DECR RIGHT NL FOLD 8th - hearing intact 9th - palate elevates symmetrically, uvula midline 11th - shoulder shrug symmetric 12th - tongue protrusion midline MASKED FACIES  MOTOR:  INCREASED TONE IN BUE AND BLE MODERATE BRADYKINESIA IN BUE AND BLE (SLOWER ON RIGHT SIDE); NO TREMOR BUE 3 BLE 3 PROX, 4 DISTAL  SENSORY:  normal and symmetric to light touch  COORDINATION:  finger-nose-finger, fine finger movements SLOW  REFLEXES:  deep tendon reflexes 1+ and symmetric  GAIT/STATION:  IN WHEEL CHAIR     DIAGNOSTIC DATA (LABS, IMAGING, TESTING) - I  reviewed patient records, labs, notes, testing and imaging myself where available.  Lab Results  Component Value Date   WBC 17.5 (H) 03/31/2023   HGB 10.2 (L) 03/31/2023   HCT 30.8 (L) 03/31/2023   MCV 92.2 03/31/2023   PLT 261 03/31/2023      Component Value Date/Time   NA 135 03/31/2023 0112   K 4.4 03/31/2023 0112   CL 104 03/31/2023 0112   CO2 22 03/31/2023 0112   GLUCOSE 108 (H) 03/31/2023 0112   BUN 42 (H) 03/31/2023 0112   CREATININE 2.49 (H) 03/31/2023 0112   CALCIUM 9.5 03/31/2023 0112   CALCIUM 13.5 (HH) 09/22/2020 2032   PROT 5.9 (L) 03/27/2023 0435   ALBUMIN 2.1 (L) 03/27/2023 0435   AST 244 (H) 03/27/2023 0435   ALT 23 03/27/2023 0435   ALKPHOS 127 (H) 03/27/2023 0435   BILITOT 0.5 03/27/2023 0435   GFRNONAA 20 (L) 03/31/2023 0112   GFRAA 34 (L) 11/12/2018 0342   Lab Results  Component Value Date   CHOL 66 03/26/2023   HDL 29 (L) 03/26/2023   LDLCALC 14 03/26/2023   TRIG 114 03/26/2023   CHOLHDL 2.3 03/26/2023   Lab Results  Component Value Date   HGBA1C 5.2 03/22/2023   No results found for: "VITAMINB12" Lab Results  Component Value Date   TSH 1.622 06/13/2021    04/11/21 CT head [I reviewed images myself and agree with interpretation. -VRP]  1. No acute intracranial abnormalities. 2. Moderate chronic microvascular ischemic change. Old right thalamic lacunar infarct. 3. Significant left periorbital hemorrhage/swelling. No injury to the left globe or postseptal  orbit. No fractures.   06/14/21 MRI cervical 1. Abbreviated, sagittal only examination. 2. No evidence of cord compression or definite cord signal abnormality in the cervical or thoracic spine. 3. Cervical and thoracic disc degeneration most notable at C5-6 where there is mild spinal stenosis and asymmetrically severe left neural foraminal stenosis. 4. No thoracic spinal stenosis  06/14/21 MRI thoracic spine  1. Abbreviated, sagittal only examination. 2. No evidence of cord compression or  definite cord signal abnormality in the cervical or thoracic spine. 3. Cervical and thoracic disc degeneration most notable at C5-6 where there is mild spinal stenosis and asymmetrically severe left neural foraminal stenosis. 4. No thoracic spinal stenosis.  03/25/23 CT head 1. Small acute right thalamic hemorrhage, volume less than 1 mL. Evidence of mild surrounding edema but no significant mass effect or other complicating features. 2. Underlying advanced chronic small vessel disease, otherwise not significantly changed from 2022.   ASSESSMENT AND PLAN  72 y.o. year old female here with:  Dx:  1. Parkinsonism, unspecified Parkinsonism type   2. 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    Dispense:  270 tablet    Refill:  4   Return in about 1 year (around 06/23/2024) for MyChart visit (15  min).        Suanne Marker, MD 06/24/2023, 10:20 AM Certified in Neurology, Neurophysiology and Neuroimaging  [facility] [address], [address] [place], [place] [address] [phone]

## 2023-06-24 NOTE — Patient Instructions (Signed)
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

## 2023-07-08 ENCOUNTER — Other Ambulatory Visit: Payer: Self-pay | Admitting: Internal Medicine

## 2023-07-08 DIAGNOSIS — R2232 Localized swelling, mass and lump, left upper limb: Secondary | ICD-10-CM

## 2023-07-10 ENCOUNTER — Ambulatory Visit (INDEPENDENT_AMBULATORY_CARE_PROVIDER_SITE_OTHER): Payer: Medicare (Managed Care) | Admitting: Nurse Practitioner

## 2023-07-10 ENCOUNTER — Ambulatory Visit (INDEPENDENT_AMBULATORY_CARE_PROVIDER_SITE_OTHER): Payer: Medicare (Managed Care)

## 2023-07-10 ENCOUNTER — Encounter: Payer: Self-pay | Admitting: Nurse Practitioner

## 2023-07-10 ENCOUNTER — Ambulatory Visit (HOSPITAL_COMMUNITY)
Admission: RE | Admit: 2023-07-10 | Discharge: 2023-07-10 | Disposition: A | Discharge status: Home / Self Care | Payer: Medicare (Managed Care) | Source: Ambulatory Visit | Attending: Internal Medicine | Admitting: Internal Medicine

## 2023-07-10 VITALS — BP 106/62 | HR 60 | Ht 64.0 in

## 2023-07-10 DIAGNOSIS — R2232 Localized swelling, mass and lump, left upper limb: Secondary | ICD-10-CM

## 2023-07-10 DIAGNOSIS — I5032 Chronic diastolic (congestive) heart failure: Secondary | ICD-10-CM | POA: Diagnosis present

## 2023-07-10 DIAGNOSIS — I6523 Occlusion and stenosis of bilateral carotid arteries: Secondary | ICD-10-CM

## 2023-07-10 DIAGNOSIS — Z87448 Personal history of other diseases of urinary system: Secondary | ICD-10-CM

## 2023-07-10 DIAGNOSIS — I495 Sick sinus syndrome: Secondary | ICD-10-CM

## 2023-07-10 DIAGNOSIS — R600 Localized edema: Secondary | ICD-10-CM | POA: Insufficient documentation

## 2023-07-10 DIAGNOSIS — I10 Essential (primary) hypertension: Secondary | ICD-10-CM | POA: Insufficient documentation

## 2023-07-10 DIAGNOSIS — I2510 Atherosclerotic heart disease of native coronary artery without angina pectoris: Secondary | ICD-10-CM | POA: Diagnosis present

## 2023-07-10 DIAGNOSIS — Z950 Presence of cardiac pacemaker: Secondary | ICD-10-CM | POA: Diagnosis present

## 2023-07-10 DIAGNOSIS — N183 Chronic kidney disease, stage 3 unspecified: Secondary | ICD-10-CM | POA: Insufficient documentation

## 2023-07-10 DIAGNOSIS — Q613 Polycystic kidney, unspecified: Secondary | ICD-10-CM | POA: Insufficient documentation

## 2023-07-10 DIAGNOSIS — E785 Hyperlipidemia, unspecified: Secondary | ICD-10-CM

## 2023-07-10 DIAGNOSIS — Z8673 Personal history of transient ischemic attack (TIA), and cerebral infarction without residual deficits: Secondary | ICD-10-CM

## 2023-07-10 LAB — CUP PACEART REMOTE DEVICE CHECK
Battery Remaining Longevity: 70 mo
Battery Remaining Percentage: 55 %
Battery Voltage: 2.99 V
Brady Statistic AP VP Percent: 7.6 %
Brady Statistic AP VS Percent: 53 %
Brady Statistic AS VP Percent: 1.5 %
Brady Statistic AS VS Percent: 38 %
Brady Statistic RA Percent Paced: 59 %
Brady Statistic RV Percent Paced: 9.1 %
Date Time Interrogation Session: 20240801020015
Implantable Lead Connection Status: 753985
Implantable Lead Connection Status: 753985
Implantable Lead Implant Date: 20110113
Implantable Lead Implant Date: 20110113
Implantable Lead Location: 753859
Implantable Lead Location: 753860
Implantable Pulse Generator Implant Date: 20191008
Lead Channel Impedance Value: 430 Ohm
Lead Channel Impedance Value: 650 Ohm
Lead Channel Pacing Threshold Amplitude: 0.75 V
Lead Channel Pacing Threshold Amplitude: 0.875 V
Lead Channel Pacing Threshold Pulse Width: 0.4 ms
Lead Channel Pacing Threshold Pulse Width: 0.4 ms
Lead Channel Sensing Intrinsic Amplitude: 12 mV
Lead Channel Sensing Intrinsic Amplitude: 4.2 mV
Lead Channel Setting Pacing Amplitude: 1 V
Lead Channel Setting Pacing Amplitude: 1.875
Lead Channel Setting Pacing Pulse Width: 0.4 ms
Lead Channel Setting Sensing Sensitivity: 2 mV
Pulse Gen Model: 2272
Pulse Gen Serial Number: 9064940

## 2023-07-10 NOTE — Patient Instructions (Addendum)
Medication Instructions:  Your physician recommends that you continue on your current medications as directed. Please refer to the Current Medication list given to you today.  Labwork: None  Testing/Procedures: Your physician has requested that you have an echocardiogram. Echocardiography is a painless test that uses sound waves to create images of your heart. It provides your doctor with information about the size and shape of your heart and how well your heart's chambers and valves are working. This procedure takes approximately one hour. There are no restrictions for this procedure. Please do NOT wear cologne, perfume, aftershave, or lotions (deodorant is allowed). Please arrive 15 minutes prior to your appointment time.  Follow-Up: Your physician recommends that you schedule a follow-up appointment in: 6 weeks Philis Nettle or APP  Any Other Special Instructions Will Be Listed Below (If Applicable).    If you need a refill on your cardiac medications before your next appointment, please call your pharmacy.

## 2023-07-10 NOTE — Progress Notes (Addendum)
Cardiology Office Note:  .   Date:  07/10/2023 ID:  Melissa Lopez, DOB 12-Jan-1951, MRN [id number] PCP: [facility]  [facility] Providers Cardiologist:  Dina Rich, MD Electrophysiologist:  Hillis Range, MD (Inactive)    History of Present Illness: Melissa Lopez is a 72 y.o. female with a PMH of CAD, chronic diastolic CHF, HTN, T2DM, HLD, symptomatic bradycardia, s/p PPM, CKD stage 3, polycystic kidney disease, hx of hydronephrosis with hematuria and pyelonephritis of left kidney, hx of autonomic dysfunction/syncope in setting of orthostatic hypotension, hx of frequent falls, hx of parkinsonism, right thalamic ICH (CVA), hx of PE in 2019 (was on Eliquis), who presents today for swelling and weight gain evaluation.   Last seen by Cadence Lorna Few on December 31, 2021. Was doing well at the time.   Admitted 03/2023 for acute right thalamic hemorrhagic stroke secondary to hypertension in setting of Eliquis use. Renal CT on admission showed L hydronephrosis and clot in renal pelvis likely d/t hemorrhage in cyst. Hematuria resolved. Required transfer to [facility] and was in ICU, required BP control with IV medications. Eliquis was on hold at d/c. D/C to SNF.   Today she presents for follow-up with her daughter who acts as her historian. States weight at discharge was 161 pounds.  When they recently did her evaluation at [facility], was found to be up 20 pounds. She was started on a diuretic and has been down 3 pounds since and has noticed some improvement in peripheral edema, specifically along left upper extremity, but still noticeable. Per daughter's report, [facility] is weighing her every day and did labs not too long ago. Her mobility is improving after her stroke and she is continuing to make progress. Patient denies any chest pain, shortness of breath, palpitations, syncope, presyncope, dizziness, orthopnea, PND, acute bleeding, or claudication. Recent doppler  was negative for DVT of left upper extremity.   Studies Reviewed: Marland Kitchen    EKG: EKG Interpretation Date/Time:  Thursday July 10 2023 14:42:58 EDT Ventricular Rate:  60 PR Interval:  240 QRS Duration:  90 QT Interval:  414 QTC Calculation: 414 R Axis:   5  Text Interpretation: Atrial-paced rhythm with prolonged AV conduction Low voltage QRS When compared with ECG of 01-Aug-2021 13:18, Electronic atrial pacemaker has replaced Sinus rhythm Criteria for Septal infarct are no longer Present Non-specific change in ST segment in Anterior leads Confirmed by Sharlene Dory [phone]) on 07/10/2023 2:44:46 PM   Carotid duplex 03/2023: Bilateral ICA stenosis-1 to 39%.   Echo 02/2022:  1. Left ventricular ejection fraction, by estimation, is 65 to 70%. The  left ventricle has normal function. The left ventricle has no regional  wall motion abnormalities. There is moderate concentric left ventricular  hypertrophy. Left ventricular  diastolic parameters are consistent with Grade I diastolic dysfunction  (impaired relaxation). 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   2. Right ventricular systolic function is normal. The right ventricular  size is normal. Mildly increased right ventricular wall thickness. There  is normal pulmonary artery systolic pressure.   3. The mitral valve is normal in structure. No evidence of mitral valve  regurgitation. No evidence of mitral stenosis.   4. The aortic valve is tricuspid. Aortic valve regurgitation is not  visualized. Aortic valve sclerosis is present, with no evidence of aortic  valve stenosis.   Comparison(s): No significant change from prior study.  Physical Exam:   VS:  BP 106/62   Pulse 60   Ht 5\' 4"  (1.626 m)   SpO2 94%   BMI 31.07 kg/m    Wt Readings from Last 3 Encounters:  06/24/23 181 lb (82.1 kg)  03/31/23 176 lb 5.9 oz (80 kg)  01/01/22 140 lb 6.4 oz (63.7 kg)    GEN: Well nourished, well  developed in no acute distress NECK: No JVD; No carotid bruits CARDIAC: S1/S2, RRR, no murmurs, rubs, gallops RESPIRATORY:  Clear to auscultation without rales, wheezing or rhonchi  ABDOMEN: Soft, non-tender, non-distended EXTREMITIES:  Nonpitting edema to LUE; No deformity, able to follow commands, generalized weakness from stroke  ASSESSMENT AND PLAN: .    Chronic diastolic CHF, peripheral edema/weight gain Stage C, NYHA class I-II symptoms. 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 Nonpitting edema noted along LUE, no DVT on doppler. Will update Echo at this time. Will request most recent labs from [facility] in order to adjust diuretic regimen. Low sodium diet, fluid restriction <2L, and daily weights encouraged. Educated to contact our office for weight gain of 2 lbs overnight or 5 lbs in one week.  CAD, HLD, carotid artery stenosis Stable with no anginal symptoms. No indication for ischemic evaluation. LDL 19 12/2022. Carotid duplex 03/2023 showed 1-39% bilateral ICA stenosis. Asymptomatic. Continue crestor, losartan, and coreg. Heart healthy diet and regular cardiovascular exercise encouraged.   HTN BP stable. Discussed to monitor BP at home at least 2 hours after medications and sitting for 5-10 minutes. No medication changes at this time. Have requested labs to be faxed from [facility]. Heart healthy diet and regular cardiovascular exercise encouraged.   Symptomatic bradycardia, s/p PPM Normal device function seen during recent remote device check. Continue to follow-up with EP.   CKD stage 3, polycystic kidney disease, hx of hydronephrosis with hematuria and pyelonephritis of left kidney Denies any symptoms. Have requested labs from PCP. At next office visit, plan to discuss need for follow-up ultrasound that was mentioned in d/c summary from hospital stay 03/2023, does not appear that this has been completed.   Hx of right thalamic ICH Recovering well. No longer  on Eliquis, was on reduced Elqiuis dosing and Aspirin 81 mg daily. Both are currently still on hold. Will route note to Cardiologist regarding recs, to see if Aspirin 81 mg daily can be restarted.   Addendum 07/27/23: Dr. Wyline Mood recommended "would need to clear aspirin with her neurologist Dr Marjory Lies."  Dispo: Follow-up with me or APP in 6 weeks or sooner if anything changes.   Signed, Sharlene Dory, NP

## 2023-07-16 ENCOUNTER — Other Ambulatory Visit: Payer: Medicare (Managed Care)

## 2023-07-23 NOTE — Progress Notes (Signed)
Remote pacemaker transmission.   

## 2023-07-30 ENCOUNTER — Ambulatory Visit: Payer: Medicare (Managed Care) | Attending: Nurse Practitioner

## 2023-07-30 DIAGNOSIS — R600 Localized edema: Secondary | ICD-10-CM

## 2023-07-30 LAB — ECHOCARDIOGRAM COMPLETE
AR max vel: 2.96 cm2
AV Area VTI: 2.49 cm2
AV Area mean vel: 2.61 cm2
AV Mean grad: 5 mmHg
AV Peak grad: 9.6 mmHg
Ao pk vel: 1.55 m/s
Area-P 1/2: 2.86 cm2
Calc EF: 67 %
MV VTI: 2.14 cm2
S' Lateral: 3 cm
Single Plane A2C EF: 73 %
Single Plane A4C EF: 62.6 %

## 2023-08-15 ENCOUNTER — Ambulatory Visit: Payer: Medicare (Managed Care) | Attending: Cardiovascular Disease | Admitting: Cardiovascular Disease

## 2023-08-15 VITALS — BP 116/74 | HR 60 | Ht 64.0 in | Wt 189.4 lb

## 2023-08-15 DIAGNOSIS — I10 Essential (primary) hypertension: Secondary | ICD-10-CM | POA: Diagnosis not present

## 2023-08-15 DIAGNOSIS — Z950 Presence of cardiac pacemaker: Secondary | ICD-10-CM

## 2023-08-15 LAB — CUP PACEART INCLINIC DEVICE CHECK
Battery Remaining Longevity: 72 mo
Battery Voltage: 2.99 V
Brady Statistic RA Percent Paced: 60 %
Brady Statistic RV Percent Paced: 8.1 %
Date Time Interrogation Session: 20240906135044
Implantable Lead Connection Status: 753985
Implantable Lead Connection Status: 753985
Implantable Lead Implant Date: 20110113
Implantable Lead Implant Date: 20110113
Implantable Lead Location: 753859
Implantable Lead Location: 753860
Implantable Pulse Generator Implant Date: 20191008
Lead Channel Impedance Value: 462.5 Ohm
Lead Channel Impedance Value: 687.5 Ohm
Lead Channel Pacing Threshold Amplitude: 0.75 V
Lead Channel Pacing Threshold Amplitude: 0.75 V
Lead Channel Pacing Threshold Amplitude: 1 V
Lead Channel Pacing Threshold Amplitude: 1 V
Lead Channel Pacing Threshold Pulse Width: 0.4 ms
Lead Channel Pacing Threshold Pulse Width: 0.4 ms
Lead Channel Pacing Threshold Pulse Width: 0.4 ms
Lead Channel Pacing Threshold Pulse Width: 0.4 ms
Lead Channel Sensing Intrinsic Amplitude: 12 mV
Lead Channel Sensing Intrinsic Amplitude: 4.8 mV
Lead Channel Setting Pacing Amplitude: 1 V
Lead Channel Setting Pacing Amplitude: 1.75 V
Lead Channel Setting Pacing Pulse Width: 0.4 ms
Lead Channel Setting Sensing Sensitivity: 2 mV
Pulse Gen Model: 2272
Pulse Gen Serial Number: 9064940

## 2023-08-15 NOTE — Patient Instructions (Addendum)
Medication Instructions:  Continue all current medications.  Labwork: none  Testing/Procedures: none  Follow-Up: 1 year   Any Other Special Instructions Will Be Listed Below (If Applicable).  If you need a refill on your cardiac medications before your next appointment, please call your pharmacy.  

## 2023-08-15 NOTE — Progress Notes (Signed)
Electrophysiology TeleHealth Note   Date:  08/15/2023   ID:  Melissa Lopez, DOB 05/12/1951, MRN [id number]  Location: patient's home  Provider location:  [facility]  Evaluation Performed: Follow-up visit  PCP:  [facility]  Cardiologist:  Dr Purvis Sheffield Electrophysiologist:  Dr Johney Frame  Chief Complaint:  HTN  History of Present Illness:    Melissa Lopez is a 72 y.o. female who presents for electrophysiology follow-up.  She recently moved from [place] to [place] to live with her daughter. She is s/p PPM (SJM) 2008 with generator change 2019.  She has done well since her PPM implant.     Today, she denies symptoms of palpitations, chest pain, shortness of breath, dizziness, presyncope, or syncope.  she has no device related complaints -- no new tenderness, drainage, redness.The patient is otherwise without complaint today.   Past Medical History:  Diagnosis Date   CKD (chronic kidney disease) stage 3, GFR 30-59 ml/min (HCC) 10/31/2018   - per patient report   Coronary artery disease    per patient report - 2 caths with non-obstructive CAD   Essential hypertension    Frequent falls    H/O cardiac pacemaker 10/31/2018   implanted 05/2010   Sick sinus syndrome (HCC)    Type 2 diabetes mellitus with complication, with long-term current use of insulin (HCC) 10/31/2018   with diabetic nephropathy    Past Surgical History:  Procedure Laterality Date   BIOPSY  09/28/2020   Procedure: BIOPSY;  Surgeon: Bernette Redbird, MD;  Location: [facility];  Service: Endoscopy;;   BIOPSY  08/03/2021   Procedure: BIOPSY;  Surgeon: Corbin Ade, MD;  [facility]: [facility];  Service: Endoscopy;;   CARDIAC PACEMAKER PLACEMENT  2008   in [place] (SJM) for sick sinus syndrome   CHOLECYSTECTOMY     CORONARY/GRAFT ACUTE MI REVASCULARIZATION N/A 11/09/2020   Procedure: CORONARY/GRAFT ACUTE MI REVASCULARIZATION;  Surgeon: Marykay Lex, MD;  Location: [facility]  INVASIVE CV LAB;  Service: Cardiovascular;  Laterality: N/A;   ESOPHAGOGASTRODUODENOSCOPY N/A 09/28/2020   Procedure: ESOPHAGOGASTRODUODENOSCOPY (EGD);  Surgeon: Bernette Redbird, MD;  Location: [facility];  Service: Endoscopy;  Laterality: N/A;   ESOPHAGOGASTRODUODENOSCOPY (EGD) WITH PROPOFOL N/A 11/10/2018   Procedure: ESOPHAGOGASTRODUODENOSCOPY (EGD) WITH PROPOFOL;  Surgeon: Charlott Rakes, MD;  Location: [facility] ENDOSCOPY;  Service: Endoscopy;  Laterality: N/A;   ESOPHAGOGASTRODUODENOSCOPY (EGD) WITH PROPOFOL N/A 08/03/2021   severe reflux esophagitis, bile gastritis, felt to have delayed gastric emptying. Pathology from esophageal biopsies with fibrinopurulent exudate and granulation tissue consistent with ulcer base   LEFT HEART CATH AND CORONARY ANGIOGRAPHY N/A 10/31/2018   Procedure: LEFT HEART CATH AND CORONARY ANGIOGRAPHY;  Surgeon: Marykay Lex, MD;  Location: [facility] INVASIVE CV LAB;  Service: Cardiovascular;  Laterality: N/A;   PACEMAKER GENERATOR CHANGE  09/2018   in [place] (SJM)   REPLACEMENT TOTAL KNEE BILATERAL      Current Outpatient Medications  Medication Sig Dispense Refill   acetaminophen (TYLENOL) 325 MG tablet Take 650 mg by mouth in the morning, at noon, and at bedtime.     amLODipine (NORVASC) 10 MG tablet Take 1 tablet (10 mg total) by mouth daily.     carbidopa-levodopa (SINEMET IR) 25-100 MG tablet Take 1 tablet by mouth 3 (three) times daily before meals. 270 tablet 4   carvedilol (COREG) 12.5 MG tablet Take 1 tablet (12.5 mg total) by mouth 2 (two) times daily with a meal.     cinacalcet (  SENSIPAR) 30 MG tablet Take 1.5 tablets (45 mg total) by mouth in the morning. 60 tablet    FLUoxetine (PROZAC) 20 MG tablet Take 40 mg by mouth at bedtime.     gabapentin (NEURONTIN) 100 MG capsule Take 2 capsules (200 mg total) by mouth at bedtime.     hydrALAZINE (APRESOLINE) 50 MG tablet Take 50 mg by mouth 3 (three) times daily.     lidocaine (LIDODERM) 5 % Place 2  patches onto the skin daily as needed (pain (hip & back)). (Apply hip and back )Remove & Discard patch within 12 hours or as directed by MD     losartan (COZAAR) 50 MG tablet Take 1 tablet (50 mg total) by mouth daily.     Menthol, Topical Analgesic, (BIOFREEZE EX) Apply 1 application topically 3 (three) times daily as needed (pain.).     metoCLOPramide (REGLAN) 10 MG tablet Take 10 mg by mouth in the morning and at bedtime.     polyethylene glycol (MIRALAX / GLYCOLAX) 17 g packet Take 17 g by mouth 2 (two) times daily. (Patient taking differently: Take 17 g by mouth 2 (two) times daily as needed for moderate constipation or mild constipation.) 14 each 0   rosuvastatin (CRESTOR) 40 MG tablet Take 40 mg by mouth in the morning.     senna-docusate (SENOKOT-S) 8.6-50 MG tablet Take 2 tablets by mouth 2 (two) times daily as needed for mild constipation.     Skin Protectants, Misc. (MINERIN CREME EX) Apply topically daily.     UNKNOWN TO PATIENT Place 1-2 sprays into the nose daily. For Degenerative Disc Disease     cyclobenzaprine (FLEXERIL) 5 MG tablet Take 5 mg by mouth at bedtime. (Patient not taking: Reported on 08/15/2023)     insulin aspart (NOVOLOG) 100 UNIT/ML injection Inject 0-5 Units into the skin at bedtime. (Patient not taking: Reported on 08/15/2023) 10 mL 11   Magnesium 400 MG CAPS Take 400 mg by mouth daily. (Patient not taking: Reported on 08/15/2023)     Nutritional Supplements (,FEEDING SUPPLEMENT, PROSOURCE PLUS) liquid Take 30 mLs by mouth daily. (Patient not taking: Reported on 08/15/2023) 887 mL 0   No current facility-administered medications for this visit.    Allergies:   Contrast media [iodinated contrast media], Oxycodone-acetaminophen, Xarelto  [rivaroxaban], and Oxycodone   Social History:  The patient  reports that she quit smoking about 53 years ago. Her smoking use included cigarettes. She started smoking about 56 years ago. She has never used smokeless tobacco. She reports  that she does not currently use alcohol. She reports that she does not currently use drugs.   Family History:  The patient's father died in his 75s of CAD  ROS:  Please see the history of present illness.   All other systems are personally reviewed and negative.    Exam:    Vital Signs:  BP 116/74   Pulse 60   Ht 5\' 4"  (1.626 m)   Wt 189 lb 6.4 oz (85.9 kg)   SpO2 98%   BMI 32.51 kg/m   Well appearing, alert and conversant, regular work of breathing,  good skin color Eyes- anicteric, neuro- grossly intact, skin- no apparent rash or lesions or cyanosis, mouth- oral mucosa is pink   Labs/Other Tests and Data Reviewed:    Recent Labs: 03/27/2023: ALT 23 03/28/2023: Magnesium 2.2 03/31/2023: BUN 42; Creatinine, Ser 2.49; Hemoglobin 10.2; Platelets 261; Potassium 4.4; Sodium 135   Wt Readings from Last 3 Encounters:  08/15/23 189 lb 6.4 oz (85.9 kg)  06/24/23 181 lb (82.1 kg)  03/31/23 176 lb 5.9 oz (80 kg)     Other studies personally reviewed: Additional studies/ records that were reviewed today include: Dr Junius Argyle notes  Review of the above records today demonstrates: as above   Last device remote is reviewed from [facility] PDF dated 04/15/2019 which reveals normal device function, no arrhythmias    ASSESSMENT & PLAN:    1.  Sick sinus syndrome Normal pacemaker function See [facility] report No changes today she is not device dependant today  2. HTN Stable No change required today  3. Dependent edema Addressed with Sharlene Dory recently Labs, records requested from PCP -- per pt's family water pill "starts with a T" was prescribed recently.  Follow-up:  With me in a year   Signed, Maurice Small, MD  08/15/2023 10:34 AM     [facility] [address] [place] [address] [phone] (office) [phone] (fax)

## 2023-08-21 ENCOUNTER — Ambulatory Visit: Payer: Medicare (Managed Care) | Admitting: Nurse Practitioner

## 2023-08-28 ENCOUNTER — Ambulatory Visit: Payer: Medicare (Managed Care) | Admitting: Nurse Practitioner

## 2023-10-09 ENCOUNTER — Ambulatory Visit: Payer: Medicare (Managed Care) | Admitting: Nurse Practitioner

## 2023-10-09 ENCOUNTER — Ambulatory Visit (INDEPENDENT_AMBULATORY_CARE_PROVIDER_SITE_OTHER): Payer: Medicare (Managed Care)

## 2023-10-09 DIAGNOSIS — I495 Sick sinus syndrome: Secondary | ICD-10-CM | POA: Diagnosis not present

## 2023-10-09 LAB — CUP PACEART REMOTE DEVICE CHECK
Battery Remaining Longevity: 67 mo
Battery Remaining Percentage: 52 %
Battery Voltage: 2.99 V
Brady Statistic AP VP Percent: 7 %
Brady Statistic AP VS Percent: 50 %
Brady Statistic AS VP Percent: 1.6 %
Brady Statistic AS VS Percent: 41 %
Brady Statistic RA Percent Paced: 56 %
Brady Statistic RV Percent Paced: 8.8 %
Date Time Interrogation Session: 20241031020029
Implantable Lead Connection Status: 753985
Implantable Lead Connection Status: 753985
Implantable Lead Implant Date: 20110113
Implantable Lead Implant Date: 20110113
Implantable Lead Location: 753859
Implantable Lead Location: 753860
Implantable Pulse Generator Implant Date: 20191008
Lead Channel Impedance Value: 440 Ohm
Lead Channel Impedance Value: 650 Ohm
Lead Channel Pacing Threshold Amplitude: 0.75 V
Lead Channel Pacing Threshold Amplitude: 0.875 V
Lead Channel Pacing Threshold Pulse Width: 0.4 ms
Lead Channel Pacing Threshold Pulse Width: 0.4 ms
Lead Channel Sensing Intrinsic Amplitude: 12 mV
Lead Channel Sensing Intrinsic Amplitude: 4.6 mV
Lead Channel Setting Pacing Amplitude: 1 V
Lead Channel Setting Pacing Amplitude: 1.875
Lead Channel Setting Pacing Pulse Width: 0.4 ms
Lead Channel Setting Sensing Sensitivity: 2 mV
Pulse Gen Model: 2272
Pulse Gen Serial Number: 9064940

## 2023-10-09 NOTE — Progress Notes (Deleted)
Cardiology Office Note:  .   Date:  07/10/2023 ID:  Melissa Lopez, DOB [birth year], MRN [id number] PCP: [facility] Providers Cardiologist:  Dina Rich, MD Electrophysiologist:  Maurice Small, MD    History of Present Illness: Melissa Lopez is a 72 y.o. female with a PMH of CAD, chronic diastolic CHF, HTN, T2DM, HLD, symptomatic bradycardia, s/p PPM, CKD stage 3, polycystic kidney disease, hx of hydronephrosis with hematuria and pyelonephritis of left kidney, hx of autonomic dysfunction/syncope in setting of orthostatic hypotension, hx of frequent falls, hx of parkinsonism, right thalamic ICH (CVA), hx of PE in 2019 (was on Eliquis), who presents today for swelling and weight gain evaluation.   Last seen by Cadence Lorna Few on December 31, 2021. Was doing well at the time.   Admitted 03/2023 for acute right thalamic hemorrhagic stroke secondary to hypertension in setting of Eliquis use. Renal CT on admission showed L hydronephrosis and clot in renal pelvis likely d/t hemorrhage in cyst. Hematuria resolved. Required transfer to [facility] and was in ICU, required BP control with IV medications. Eliquis was on hold at d/c. D/C to SNF.   Today she presents for follow-up with her daughter who acts as her historian. States weight at discharge was 161 pounds.  When they recently did her evaluation at [facility], was found to be up 20 pounds. She was started on a diuretic and has been down 3 pounds since and has noticed some improvement in peripheral edema, specifically along left upper extremity, but still noticeable. Per daughter's report, [facility] is weighing her every day and did labs not too long ago. Her mobility is improving after her stroke and she is continuing to make progress. Patient denies any chest pain, shortness of breath, palpitations, syncope, presyncope, dizziness, orthopnea, PND, acute bleeding, or claudication. Recent doppler was  negative for DVT of left upper extremity.   Studies Reviewed: Marland Kitchen    EKG:     Carotid duplex 03/2023: Bilateral ICA stenosis-1 to 39%.   Echo 02/2022:  1. Left ventricular ejection fraction, by estimation, is 65 to 70%. The  left ventricle has normal function. The left ventricle has no regional  wall motion abnormalities. There is moderate concentric left ventricular  hypertrophy. Left ventricular  diastolic parameters are consistent with Grade I diastolic dysfunction  (impaired relaxation). 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   2. Right ventricular systolic function is normal. The right ventricular  size is normal. Mildly increased right ventricular wall thickness. There  is normal pulmonary artery systolic pressure.   3. The mitral valve is normal in structure. No evidence of mitral valve  regurgitation. No evidence of mitral stenosis.   4. The aortic valve is tricuspid. Aortic valve regurgitation is not  visualized. Aortic valve sclerosis is present, with no evidence of aortic  valve stenosis.   Comparison(s): No significant change from prior study.  Physical Exam:   VS:  There were no vitals taken for this visit.   Wt Readings from Last 3 Encounters:  08/15/23 189 lb 6.4 oz (85.9 kg)  06/24/23 181 lb (82.1 kg)  03/31/23 176 lb 5.9 oz (80 kg)    GEN: Well nourished, well developed in no acute distress NECK: No JVD; No carotid bruits CARDIAC: S1/S2, RRR, no murmurs, rubs, gallops RESPIRATORY:  Clear to auscultation without rales, wheezing or rhonchi  ABDOMEN: Soft, non-tender, non-distended EXTREMITIES:  Nonpitting edema to  LUE; No deformity, able to follow commands, generalized weakness from stroke  ASSESSMENT AND PLAN: .    Chronic diastolic CHF, peripheral edema/weight gain Stage C, NYHA class I-II symptoms. 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 Nonpitting edema noted along LUE, no DVT on  doppler. Will update Echo at this time. Will request most recent labs from [facility] in order to adjust diuretic regimen. Low sodium diet, fluid restriction <2L, and daily weights encouraged. Educated to contact our office for weight gain of 2 lbs overnight or 5 lbs in one week.  CAD, HLD, carotid artery stenosis Stable with no anginal symptoms. No indication for ischemic evaluation. LDL 19 12/2022. Carotid duplex 03/2023 showed 1-39% bilateral ICA stenosis. Asymptomatic. Continue crestor, losartan, and coreg. Heart healthy diet and regular cardiovascular exercise encouraged.   HTN BP stable. Discussed to monitor BP at home at least 2 hours after medications and sitting for 5-10 minutes. No medication changes at this time. Have requested labs to be faxed from [facility]. Heart healthy diet and regular cardiovascular exercise encouraged.   Symptomatic bradycardia, s/p PPM Normal device function seen during recent remote device check. Continue to follow-up with EP.   CKD stage 3, polycystic kidney disease, hx of hydronephrosis with hematuria and pyelonephritis of left kidney Denies any symptoms. Have requested labs from PCP. At next office visit, plan to discuss need for follow-up ultrasound that was mentioned in d/c summary from hospital stay 03/2023, does not appear that this has been completed.   Hx of right thalamic ICH Recovering well. No longer on Eliquis, was on reduced Elqiuis dosing and Aspirin 81 mg daily. Both are currently still on hold. Will route note to Cardiologist regarding recs, to see if Aspirin 81 mg daily can be restarted.   Addendum 07/27/23: Dr. Wyline Mood recommended "would need to clear aspirin with her neurologist Dr Marjory Lies."  Dispo: Follow-up with me or APP in 6 weeks or sooner if anything changes.   Signed, Sharlene Dory, NP

## 2023-10-22 NOTE — Progress Notes (Signed)
Remote pacemaker transmission.   

## 2023-11-21 ENCOUNTER — Encounter: Payer: Self-pay | Admitting: Nurse Practitioner

## 2023-11-21 ENCOUNTER — Ambulatory Visit: Payer: Medicare (Managed Care) | Admitting: Nurse Practitioner

## 2023-11-21 NOTE — Progress Notes (Unsigned)
Cardiology Office Note:  .   Date:  07/10/2023 ID:  Melissa Lopez, DOB August 14, 1951, MRN [id number] PCP: [facility] Providers Cardiologist:  Dina Rich, MD Electrophysiologist:  Maurice Small, MD    History of Present Illness: Melissa Lopez is a 72 y.o. female with a PMH of CAD, chronic diastolic CHF, HTN, T2DM, HLD, symptomatic bradycardia, s/p PPM, CKD stage 3, polycystic kidney disease, hx of hydronephrosis with hematuria and pyelonephritis of left kidney, hx of autonomic dysfunction/syncope in setting of orthostatic hypotension, hx of frequent falls, hx of parkinsonism, right thalamic ICH (CVA), hx of PE in 2019 (was on Eliquis), who presents today for swelling and weight gain evaluation.   Last seen by Cadence Lorna Few on December 31, 2021. Was doing well at the time.   Admitted 03/2023 for acute right thalamic hemorrhagic stroke secondary to hypertension in setting of Eliquis use. Renal CT on admission showed L hydronephrosis and clot in renal pelvis likely d/t hemorrhage in cyst. Hematuria resolved. Required transfer to [facility] and was in ICU, required BP control with IV medications. Eliquis was on hold at d/c. D/C to SNF.   Today she presents for follow-up with her daughter who acts as her historian. States weight at discharge was 161 pounds.  When they recently did her evaluation at [facility], was found to be up 20 pounds. She was started on a diuretic and has been down 3 pounds since and has noticed some improvement in peripheral edema, specifically along left upper extremity, but still noticeable. Per daughter's report, [facility] is weighing her every day and did labs not too long ago. Her mobility is improving after her stroke and she is continuing to make progress. Patient denies any chest pain, shortness of breath, palpitations, syncope, presyncope, dizziness, orthopnea, PND, acute bleeding, or claudication. Recent doppler was  negative for DVT of left upper extremity.   Studies Reviewed: Marland Kitchen    EKG:     Carotid duplex 03/2023: Bilateral ICA stenosis-1 to 39%.   Echo 02/2022:  1. Left ventricular ejection fraction, by estimation, is 65 to 70%. The  left ventricle has normal function. The left ventricle has no regional  wall motion abnormalities. There is moderate concentric left ventricular  hypertrophy. Left ventricular  diastolic parameters are consistent with Grade I diastolic dysfunction  (impaired relaxation). 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   2. Right ventricular systolic function is normal. The right ventricular  size is normal. Mildly increased right ventricular wall thickness. There  is normal pulmonary artery systolic pressure.   3. The mitral valve is normal in structure. No evidence of mitral valve  regurgitation. No evidence of mitral stenosis.   4. The aortic valve is tricuspid. Aortic valve regurgitation is not  visualized. Aortic valve sclerosis is present, with no evidence of aortic  valve stenosis.   Comparison(s): No significant change from prior study.  Physical Exam:   VS:  There were no vitals taken for this visit.   Wt Readings from Last 3 Encounters:  08/15/23 189 lb 6.4 oz (85.9 kg)  06/24/23 181 lb (82.1 kg)  03/31/23 176 lb 5.9 oz (80 kg)    GEN: Well nourished, well developed in no acute distress NECK: No JVD; No carotid bruits CARDIAC: S1/S2, RRR, no murmurs, rubs, gallops RESPIRATORY:  Clear to auscultation without rales, wheezing or rhonchi  ABDOMEN: Soft, non-tender, non-distended EXTREMITIES:  Nonpitting edema to  LUE; No deformity, able to follow commands, generalized weakness from stroke  ASSESSMENT AND PLAN: .    Chronic diastolic CHF, peripheral edema/weight gain Stage C, NYHA class I-II symptoms. 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 Nonpitting edema noted along LUE, no DVT on  doppler. Will update Echo at this time. Will request most recent labs from [facility] in order to adjust diuretic regimen. Low sodium diet, fluid restriction <2L, and daily weights encouraged. Educated to contact our office for weight gain of 2 lbs overnight or 5 lbs in one week.  CAD, HLD, carotid artery stenosis Stable with no anginal symptoms. No indication for ischemic evaluation. LDL 19 12/2022. Carotid duplex 03/2023 showed 1-39% bilateral ICA stenosis. Asymptomatic. Continue crestor, losartan, and coreg. Heart healthy diet and regular cardiovascular exercise encouraged.   HTN BP stable. Discussed to monitor BP at home at least 2 hours after medications and sitting for 5-10 minutes. No medication changes at this time. Have requested labs to be faxed from [facility]. Heart healthy diet and regular cardiovascular exercise encouraged.   Symptomatic bradycardia, s/p PPM Normal device function seen during recent remote device check. Continue to follow-up with EP.   CKD stage 3, polycystic kidney disease, hx of hydronephrosis with hematuria and pyelonephritis of left kidney Denies any symptoms. Have requested labs from PCP. At next office visit, plan to discuss need for follow-up ultrasound that was mentioned in d/c summary from hospital stay 03/2023, does not appear that this has been completed.   Hx of right thalamic ICH Recovering well. No longer on Eliquis, was on reduced Elqiuis dosing and Aspirin 81 mg daily. Both are currently still on hold. Will route note to Cardiologist regarding recs, to see if Aspirin 81 mg daily can be restarted.   Addendum 07/27/23: Dr. Wyline Mood recommended "would need to clear aspirin with her neurologist Dr Marjory Lies."  Dispo: Follow-up with me or APP in 6 weeks or sooner if anything changes.   Signed, Sharlene Dory, NP

## 2024-01-08 ENCOUNTER — Ambulatory Visit (INDEPENDENT_AMBULATORY_CARE_PROVIDER_SITE_OTHER): Payer: Medicare (Managed Care)

## 2024-01-08 DIAGNOSIS — I495 Sick sinus syndrome: Secondary | ICD-10-CM

## 2024-01-08 LAB — CUP PACEART REMOTE DEVICE CHECK
Battery Remaining Longevity: 65 mo
Battery Remaining Percentage: 50 %
Battery Voltage: 2.99 V
Brady Statistic AP VP Percent: 7.8 %
Brady Statistic AP VS Percent: 43 %
Brady Statistic AS VP Percent: 2 %
Brady Statistic AS VS Percent: 46 %
Brady Statistic RA Percent Paced: 50 %
Brady Statistic RV Percent Paced: 9.9 %
Date Time Interrogation Session: 20250130020013
Implantable Lead Connection Status: 753985
Implantable Lead Connection Status: 753985
Implantable Lead Implant Date: 20110113
Implantable Lead Implant Date: 20110113
Implantable Lead Location: 753859
Implantable Lead Location: 753860
Implantable Pulse Generator Implant Date: 20191008
Lead Channel Impedance Value: 480 Ohm
Lead Channel Impedance Value: 680 Ohm
Lead Channel Pacing Threshold Amplitude: 0.75 V
Lead Channel Pacing Threshold Amplitude: 0.875 V
Lead Channel Pacing Threshold Pulse Width: 0.4 ms
Lead Channel Pacing Threshold Pulse Width: 0.4 ms
Lead Channel Sensing Intrinsic Amplitude: 12 mV
Lead Channel Sensing Intrinsic Amplitude: 4.4 mV
Lead Channel Setting Pacing Amplitude: 1 V
Lead Channel Setting Pacing Amplitude: 1.875
Lead Channel Setting Pacing Pulse Width: 0.4 ms
Lead Channel Setting Sensing Sensitivity: 2 mV
Pulse Gen Model: 2272
Pulse Gen Serial Number: 9064940

## 2024-01-09 ENCOUNTER — Encounter: Payer: Self-pay | Admitting: Cardiovascular Disease

## 2024-02-07 DEATH — deceased

## 2024-02-13 NOTE — Progress Notes (Signed)
 Remote pacemaker transmission.

## 2024-09-10 ENCOUNTER — Encounter: Payer: Medicare (Managed Care) | Admitting: Cardiovascular Disease
